# Patient Record
Sex: Male | Born: 1935 | Race: White | Hispanic: No | Marital: Married | Smoking: Former smoker
Health system: Southern US, Community
[De-identification: ages and names within clinical notes are randomized; demographics above are authoritative.]

## PROBLEM LIST (undated history)

## (undated) DIAGNOSIS — D696 Thrombocytopenia, unspecified: Secondary | ICD-10-CM

## (undated) DIAGNOSIS — I6990 Unspecified sequelae of unspecified cerebrovascular disease: Secondary | ICD-10-CM

## (undated) DIAGNOSIS — R600 Localized edema: Secondary | ICD-10-CM

## (undated) DIAGNOSIS — Z860100 Personal history of colon polyps, unspecified: Secondary | ICD-10-CM

## (undated) DIAGNOSIS — N400 Enlarged prostate without lower urinary tract symptoms: Secondary | ICD-10-CM

## (undated) DIAGNOSIS — N2889 Other specified disorders of kidney and ureter: Secondary | ICD-10-CM

## (undated) DIAGNOSIS — Z9989 Dependence on other enabling machines and devices: Secondary | ICD-10-CM

## (undated) DIAGNOSIS — Z9289 Personal history of other medical treatment: Secondary | ICD-10-CM

## (undated) DIAGNOSIS — R4189 Other symptoms and signs involving cognitive functions and awareness: Secondary | ICD-10-CM

## (undated) DIAGNOSIS — R627 Adult failure to thrive: Secondary | ICD-10-CM

## (undated) DIAGNOSIS — N183 Chronic kidney disease, stage 3 unspecified: Secondary | ICD-10-CM

## (undated) DIAGNOSIS — I517 Cardiomegaly: Secondary | ICD-10-CM

## (undated) DIAGNOSIS — K5730 Diverticulosis of large intestine without perforation or abscess without bleeding: Secondary | ICD-10-CM

## (undated) DIAGNOSIS — G629 Polyneuropathy, unspecified: Secondary | ICD-10-CM

## (undated) DIAGNOSIS — J309 Allergic rhinitis, unspecified: Secondary | ICD-10-CM

## (undated) DIAGNOSIS — M1990 Unspecified osteoarthritis, unspecified site: Secondary | ICD-10-CM

## (undated) DIAGNOSIS — E785 Hyperlipidemia, unspecified: Secondary | ICD-10-CM

## (undated) DIAGNOSIS — C649 Malignant neoplasm of unspecified kidney, except renal pelvis: Secondary | ICD-10-CM

## (undated) DIAGNOSIS — I5189 Other ill-defined heart diseases: Secondary | ICD-10-CM

## (undated) DIAGNOSIS — K5900 Constipation, unspecified: Secondary | ICD-10-CM

## (undated) DIAGNOSIS — L719 Rosacea, unspecified: Secondary | ICD-10-CM

## (undated) DIAGNOSIS — I10 Essential (primary) hypertension: Secondary | ICD-10-CM

## (undated) DIAGNOSIS — N390 Urinary tract infection, site not specified: Secondary | ICD-10-CM

## (undated) DIAGNOSIS — K5790 Diverticulosis of intestine, part unspecified, without perforation or abscess without bleeding: Secondary | ICD-10-CM

## (undated) DIAGNOSIS — N529 Male erectile dysfunction, unspecified: Secondary | ICD-10-CM

## (undated) DIAGNOSIS — I2721 Secondary pulmonary arterial hypertension: Secondary | ICD-10-CM

## (undated) DIAGNOSIS — C439 Malignant melanoma of skin, unspecified: Secondary | ICD-10-CM

## (undated) DIAGNOSIS — F0390 Unspecified dementia without behavioral disturbance: Secondary | ICD-10-CM

## (undated) DIAGNOSIS — I4891 Unspecified atrial fibrillation: Secondary | ICD-10-CM

## (undated) DIAGNOSIS — Z9229 Personal history of other drug therapy: Secondary | ICD-10-CM

## (undated) DIAGNOSIS — I739 Peripheral vascular disease, unspecified: Secondary | ICD-10-CM

## (undated) DIAGNOSIS — R197 Diarrhea, unspecified: Secondary | ICD-10-CM

## (undated) DIAGNOSIS — H269 Unspecified cataract: Secondary | ICD-10-CM

## (undated) HISTORY — DX: Malignant neoplasm of unspecified kidney, except renal pelvis: C64.9

## (undated) HISTORY — DX: Localized edema: R60.0

## (undated) HISTORY — DX: Other specified disorders of kidney and ureter: N28.89

## (undated) HISTORY — DX: Constipation, unspecified: K59.00

## (undated) HISTORY — PX: THORACIC LAMINECTOMY: SHX96

## (undated) HISTORY — PX: CATARACT EXTRACTION: SUR2

## (undated) HISTORY — DX: Other symptoms and signs involving cognitive functions and awareness: R41.89

## (undated) HISTORY — DX: Urinary tract infection, site not specified: N39.0

## (undated) HISTORY — DX: Secondary pulmonary arterial hypertension: I27.21

## (undated) HISTORY — PX: PILONIDAL CYST DRAINAGE: SHX743

## (undated) HISTORY — PX: TONSILLECTOMY AND ADENOIDECTOMY: SUR1326

## (undated) HISTORY — DX: Unspecified osteoarthritis, unspecified site: M19.90

## (undated) HISTORY — DX: Personal history of colonic polyps: Z86.010

## (undated) HISTORY — DX: Personal history of other drug therapy: Z92.29

## (undated) HISTORY — DX: Rosacea, unspecified: L71.9

## (undated) HISTORY — PX: SPINE SURGERY: SHX786

## (undated) HISTORY — PX: OTHER SURGICAL HISTORY: SHX169

## (undated) HISTORY — DX: Male erectile dysfunction, unspecified: N52.9

## (undated) HISTORY — DX: Polyneuropathy, unspecified: G62.9

## (undated) HISTORY — PX: POLYPECTOMY: SHX149

## (undated) HISTORY — DX: Benign prostatic hyperplasia without lower urinary tract symptoms: N40.0

## (undated) HISTORY — DX: Diverticulosis of intestine, part unspecified, without perforation or abscess without bleeding: K57.90

## (undated) HISTORY — DX: Personal history of colon polyps, unspecified: Z86.0100

## (undated) HISTORY — DX: Chronic kidney disease, stage 3 unspecified: N18.30

## (undated) HISTORY — DX: Unspecified cataract: H26.9

## (undated) HISTORY — DX: Allergic rhinitis, unspecified: J30.9

## (undated) HISTORY — DX: Peripheral vascular disease, unspecified: I73.9

## (undated) HISTORY — DX: Adult failure to thrive: R62.7

## (undated) HISTORY — DX: Diverticulosis of large intestine without perforation or abscess without bleeding: K57.30

## (undated) HISTORY — DX: Other ill-defined heart diseases: I51.89

## (undated) HISTORY — DX: Diarrhea, unspecified: R19.7

## (undated) HISTORY — DX: Dependence on other enabling machines and devices: Z99.89

## (undated) HISTORY — DX: Malignant melanoma of skin, unspecified: C43.9

## (undated) HISTORY — DX: Essential (primary) hypertension: I10

## (undated) HISTORY — PX: KNEE SURGERY: SHX244

## (undated) HISTORY — DX: Hyperlipidemia, unspecified: E78.5

## (undated) HISTORY — DX: Unspecified sequelae of unspecified cerebrovascular disease: I69.90

## (undated) HISTORY — PX: APPENDECTOMY: SHX54

---
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
## 1998-01-25 ENCOUNTER — Ambulatory Visit: Admission: RE | Admit: 1998-01-25 | Discharge: 1998-01-25 | Payer: Self-pay | Admitting: Internal Medicine

## 1998-04-30 ENCOUNTER — Other Ambulatory Visit: Admission: RE | Admit: 1998-04-30 | Discharge: 1998-04-30 | Payer: Self-pay | Admitting: Gastroenterology

## 2000-03-02 ENCOUNTER — Ambulatory Visit (HOSPITAL_COMMUNITY): Admission: RE | Admit: 2000-03-02 | Discharge: 2000-03-02 | Payer: Self-pay | Admitting: Internal Medicine

## 2000-03-17 ENCOUNTER — Ambulatory Visit (HOSPITAL_COMMUNITY): Admission: RE | Admit: 2000-03-17 | Discharge: 2000-03-17 | Payer: Self-pay | Admitting: Interventional Cardiology

## 2005-08-04 ENCOUNTER — Encounter (INDEPENDENT_AMBULATORY_CARE_PROVIDER_SITE_OTHER): Payer: Self-pay | Admitting: *Deleted

## 2005-08-04 ENCOUNTER — Ambulatory Visit (HOSPITAL_BASED_OUTPATIENT_CLINIC_OR_DEPARTMENT_OTHER): Admission: RE | Admit: 2005-08-04 | Discharge: 2005-08-04 | Payer: Self-pay | Admitting: General Surgery

## 2006-03-04 ENCOUNTER — Ambulatory Visit: Payer: Self-pay | Admitting: Gastroenterology

## 2006-03-16 ENCOUNTER — Ambulatory Visit: Payer: Self-pay | Admitting: Gastroenterology

## 2007-02-03 ENCOUNTER — Encounter: Admission: RE | Admit: 2007-02-03 | Discharge: 2007-02-03 | Payer: Self-pay | Admitting: Internal Medicine

## 2007-11-05 ENCOUNTER — Ambulatory Visit (HOSPITAL_BASED_OUTPATIENT_CLINIC_OR_DEPARTMENT_OTHER): Admission: RE | Admit: 2007-11-05 | Discharge: 2007-11-05 | Payer: Self-pay | Admitting: General Surgery

## 2007-11-05 ENCOUNTER — Encounter (INDEPENDENT_AMBULATORY_CARE_PROVIDER_SITE_OTHER): Payer: Self-pay | Admitting: General Surgery

## 2008-02-04 ENCOUNTER — Encounter (INDEPENDENT_AMBULATORY_CARE_PROVIDER_SITE_OTHER): Payer: Self-pay | Admitting: General Surgery

## 2008-02-04 ENCOUNTER — Ambulatory Visit (HOSPITAL_BASED_OUTPATIENT_CLINIC_OR_DEPARTMENT_OTHER): Admission: RE | Admit: 2008-02-04 | Discharge: 2008-02-04 | Payer: Self-pay | Admitting: General Surgery

## 2008-02-07 ENCOUNTER — Encounter: Admission: RE | Admit: 2008-02-07 | Discharge: 2008-02-07 | Payer: Self-pay | Admitting: Specialist

## 2008-03-07 ENCOUNTER — Ambulatory Visit: Payer: Self-pay | Admitting: Oncology

## 2008-08-03 ENCOUNTER — Encounter: Admission: RE | Admit: 2008-08-03 | Discharge: 2008-10-05 | Payer: Self-pay | Admitting: Neurosurgery

## 2009-05-14 ENCOUNTER — Encounter: Admission: RE | Admit: 2009-05-14 | Discharge: 2009-06-13 | Payer: Self-pay | Admitting: Neurosurgery

## 2010-11-19 NOTE — Op Note (Signed)
NAMECONCEPCION, Peter Singh              ACCOUNT NO.:  [id number]   MEDICAL RECORD NO.:  [id number]          PATIENT TYPE:  AMB   LOCATION:  DSC                          FACILITY:  [facility]   PHYSICIAN:  Gabrielle Dare. Janee Morn, M.D.DATE OF BIRTH:  [birth year]   DATE OF PROCEDURE:  02/04/2008  DATE OF DISCHARGE:                               OPERATIVE REPORT   PREOPERATIVE DIAGNOSES:  1. Status post wide excision of melanoma, left forearm.  2. Residual of atypical melanocytes.   POSTOPERATIVE DIAGNOSES:  1. Status post wide excision of melanoma, left forearm.  2. Residual of atypical melanocytes.   PROCEDURE:  Wide re-excision of melanoma, left forearm.   SURGEON:  Gabrielle Dare. Janee Morn, MD   ANESTHESIA:  General.   HISTORY OF PRESENT ILLNESS:  Peter Singh is a very pleasant 77 year old  gentleman who underwent wide excision of melanoma of the left forearm  with left axillary sentinel node biopsy in May 2009.  His proximal  margin was positive for some residual atypical melanocytes, so he  returns for planned wide re-excision of the melanoma site on his left  forearm.   PROCEDURE IN DETAIL:  Informed consent was obtained.  The patient was  identified in the preop holding area.  His site was marked.  He received  intravenous antibiotics.  He was brought to the operating room.  General  anesthesia with laryngeal mask airway was administered.  His left  forearm was prepped and draped in the sterile fashion.  Marcaine 0.5%  with epinephrine was injected for postoperative pain relief.  An  elliptical re-excision was then done to encompass the entirety of his  old scar, coming just distal to the old scar and then taking a 1-cm  width of the proximal margin.  This was taken down to the underlying  fascia and excised.  It was marked for pathology orientation and passed  off.  The wound was irrigated.  Meticulous hemostasis was ensured.  The  wound was then closed in layers with deep tissues  approximated with  interrupted 3-0 Vicryl sutures and the skin was closed with  interrupted 3-0 nylon sutures.  Sponge, needle, and instrument counts  were all correct.  A sterile dressing was applied.  Length of the  specimen was 9 cm and width 1 cm.  He tolerated the procedure well  without apparent complication and was taken to the recovery room in  stable condition.      Gabrielle Dare Janee Morn, M.D.  Electronically Signed     BET/MEDQ  D:  02/04/2008  T:  02/04/2008  Job:  [id number]   cc:   Lyn Records, M.D.  Dollene Cleveland, M.D.  Gwen Pounds, MD

## 2010-11-19 NOTE — Op Note (Signed)
NAMEJEMUEL, Singh              ACCOUNT NO.:  [id number]   MEDICAL RECORD NO.:  [id number]          PATIENT TYPE:  AMB   LOCATION:  DSC                          FACILITY:  [facility]   PHYSICIAN:  Gabrielle Dare. Janee Morn, M.D.DATE OF BIRTH:  April 20, 1932   DATE OF PROCEDURE:  11/05/2007  DATE OF DISCHARGE:                               OPERATIVE REPORT   PREOPERATIVE DIAGNOSIS:  Melanoma, left forearm.   POSTOPERATIVE DIAGNOSIS:  Melanoma, left forearm.   PROCEDURES:  1. Left axillary sentinel lymph node biopsy with blue dye injection.  2. Wide excision, melanoma, left forearm, with layered closure, 9 cm.   SURGEON:  Gabrielle Dare. Janee Morn, MD   HISTORY OF PRESENT ILLNESS:  Peter Singh is a very pleasant 77-year-  old male who is status post excision of an atypical melanocytic tumor  from his right shoulder in January 2007.  He recently developed a lesion  on his left forearm that was biopsied by Dr. Mayford Knife.  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  He  presents today for left axillary sentinel lymph node biopsy and wide  excision of this melanoma from his left forearm.  He has held his  Coumadin for 3 days.   PROCEDURE IN DETAIL:  Informed consent was obtained.  The patient was  identified in the preop holding area.  He received intravenous  antibiotics.  His site was marked.  He was brought to the operating  room.  General anesthesia was administered by the anesthesia staff with  a laryngeal mask airway.  The area around the melanoma was injected with  a 2:1 mixture of saline and methylene blue in a cutaneous injection.  This was then massaged for several minutes by the clock.  He had also  received his nuclear medicine injection earlier this morning.  The  patient's left chest, axilla and entire left upper extremity were  prepped and draped in a sterile fashion.  Attention was first directed  to the axilla for the sentinel node.  The NeoProbe was used  and an area  in the anterior axilla was noted with elevated counts.  Marcaine 0.5%  with epinephrine was injected.  A transverse incision was made.  Subcutaneous tissues were dissected down into the axillary fat.  A blue  node with high signal was identified.  This was circumferentially  dissected out.  Hemostasis was carefully obtained with Bovie the cautery  due to his Coumadin history.  The node was removed in one piece.  It had  a high signal on the NeoProbe and was sent to pathology as hot blue  node.  Further exploration with the NeoProbe did not reveal any other  significant signal source.  This node did have a blue tinge.  There were  no frank abnormalities seen.  The wound was copiously irrigated and  careful and meticulous hemostasis was obtained due to his Coumadin  history.  The wound was then closed in layers with deep tissues  approximated with running 3-0 Vicryl suture, the subcutaneous tissues  approximated with interrupted 3-0 Vicryl sutures and the skin closed  with running 4-0 Monocryl subcuticular stitch.  Dermabond was then  placed.  Excellent hemostasis had been obtained in the wound.  This  wound was then covered.  Attention was directed to the melanoma.  An  area of the least 1.5 cm circumferentially was measured around his  biopsy site and an elliptical incision was planned along tissue lines.  A 9 x 4.5-cm ellipse was excised after placing some more local  anesthetic.  Subcutaneous tissues were dissected down to the underlying  fascia and it was removed in its entirety.  It was oriented with 2-0  silk for pathology.  The wound bed was then irrigated and very careful  and meticulous hemostasis was obtained due to his Coumadin history.  After orienting, the specimen had been sent off to pathology.  Flaps  were raised proximally and distally to facilitate closure.  The wound  was then closed first with subcutaneous tissues approximated with  interrupted 2-0 Vicryl  sutures.  Hemostasis was ensured and the skin was  closed with interrupted  3-0 nylon sutures.  The forearm remained soft and there was no  significant tension on the closure.  Xeroform and a sterile dressing  were applied.  Sponge, needle and instrument counts were all correct.  The patient tolerated the procedure well without apparent complication  and was taken to the recovery room in stable condition.      Gabrielle Dare Janee Morn, M.D.  Electronically Signed     BET/MEDQ  D:  11/05/2007  T:  11/05/2007  Job:  [id number]   cc:   Dollene Cleveland, M.D.  Lyn Records, M.D.

## 2010-11-22 NOTE — Cardiovascular Report (Signed)
[facility]  Patient:    Peter Singh, Peter Singh                     MRN: [id number] Proc. Date: 03/17/00 Adm. Date:  [id number] Disc. Date: [id number] Attending:  Lyn Records. Iii CC:         Jonelle Sports. Cheryll Cockayne, M.D.   Cardiac Catheterization  INDICATIONS FOR PROCEDURE:  Borderline abnormal exercise treadmill test.  No symptoms.  PROCEDURES PERFORMED: 1. Left heart catheterization. 2. Selective coronary angiography. 3. Left ventriculography. 4. Perclose.  DESCRIPTION OF PROCEDURE:  After informed consent, a 6 French sheath was started in the right femoral artery using a modified Seldinger technique.  A 6 French A2 multipurpose catheter was used for hemodynamic recordings, left ventriculography, and selective right coronary angiography.  A 6 French #4 Judkins catheter was used for left coronary angiography.  After completing the procedure a sheathogram was performed in the right iliac. Perclose was performed without complications.  RESULTS:  I:  HEMODYNAMIC DATA:     a. The aortic pressure 129/67 mmHg.     b. Left ventricular pressure 129/13 mmHg.  II:  LEFT VENTRICULOGRAPHY:  The left ventricle cavity size and systolic function are normal.  No mitral regurgitation is noted.  Ejection fraction is greater than 60%.  III:  SELECTIVE CORONARY ANGIOGRAPHY:     a. Left main:  The left main coronary artery is free of any significant        obstruction and basically appears normal.     b. Left anterior descending coronary artery:  This is a large vessel that        gives origin to a large first diagonal and a smaller second distal        diagonal.  The entire left coronary system is normal.  The LAD wraps        around the left ventricular apex.     c. Circumflex artery:  The circumflex artery is relatively small giving        origin to one large obtuse marginal branch.  The circumflex system        is normal.     d. Right coronary artery:  The  right coronary artery is a large vessel        that gives origin to PDA and two large left ventricular branches.        This system is normal.  CONCLUSIONS: 1. Normal coronary arteries. 2. Left ventricular function. 3. Borderline abnormal exercise treadmill test, does not correlate with any    evidence of significant obstructive coronary disease.  RECOMMENDATIONS:  No further cardiac evaluation at this time. DD:  03/17/00 TD:  03/17/00 Job: [id number]

## 2011-02-04 ENCOUNTER — Other Ambulatory Visit: Payer: Self-pay | Admitting: Dermatology

## 2011-02-27 ENCOUNTER — Encounter: Payer: Self-pay | Admitting: Gastroenterology

## 2011-04-03 ENCOUNTER — Encounter: Payer: Self-pay | Admitting: *Deleted

## 2011-04-04 LAB — BASIC METABOLIC PANEL
BUN: 16
CO2: 31
Chloride: 101
Potassium: 4.4

## 2011-04-04 LAB — PROTIME-INR
INR: 1.3
Prothrombin Time: 16.1 — ABNORMAL HIGH

## 2011-04-07 ENCOUNTER — Other Ambulatory Visit: Payer: Self-pay | Admitting: Dermatology

## 2011-04-08 ENCOUNTER — Ambulatory Visit (INDEPENDENT_AMBULATORY_CARE_PROVIDER_SITE_OTHER): Payer: Medicare Other | Admitting: Gastroenterology

## 2011-04-08 ENCOUNTER — Encounter: Payer: Self-pay | Admitting: Gastroenterology

## 2011-04-08 VITALS — BP 112/56 | HR 64 | Ht 79.0 in | Wt 268.4 lb

## 2011-04-08 DIAGNOSIS — Z860100 Personal history of colon polyps, unspecified: Secondary | ICD-10-CM

## 2011-04-08 NOTE — Progress Notes (Signed)
History of Present Illness: This is a 77 year old Caucasian male with long history of cardiovascular problems requiring room and then anticoagulation. Also has rather severe BPH and is on Flomax and Avodart. He has a past history of colon polyps with a negative colonoscopy 5 years ago. He currently is asymptomatic in terms of any gastrointestinal symptoms. Family history is noncontributory. Allegedly he recently did Hemoccult cards for Dr. Creola Corn. He specifically denies abdominal pain, melena, hematochezia, upper GI or hepatobiliary complaints. His appetite is good and his weight is stable. He currently is on antibiotic for URI.    Current Medications, Allergies, Past Medical History, Past Surgical History, Family History and Social History were reviewed in [employer] record.   Assessment and plan: See no need for followup colonoscopy at his age with a negative exam 5 years ago. Of course, this is predicated on the assumption that his stool Hemoccult cards are negative. Request these from Dr.Russo. If they are positive, colonoscopy with adjustment in his Coumadin medication would be indicated.  Please copy her primary care physician, referring physician, and pertinent subspecialists. Encounter Diagnosis  Name Primary?  . Personal history of colonic polyps Yes

## 2011-07-22 DIAGNOSIS — Z7901 Long term (current) use of anticoagulants: Secondary | ICD-10-CM | POA: Diagnosis not present

## 2011-07-22 DIAGNOSIS — I4891 Unspecified atrial fibrillation: Secondary | ICD-10-CM | POA: Diagnosis not present

## 2011-08-06 DIAGNOSIS — R82998 Other abnormal findings in urine: Secondary | ICD-10-CM | POA: Diagnosis not present

## 2011-08-28 DIAGNOSIS — R82998 Other abnormal findings in urine: Secondary | ICD-10-CM | POA: Diagnosis not present

## 2011-08-28 DIAGNOSIS — N401 Enlarged prostate with lower urinary tract symptoms: Secondary | ICD-10-CM | POA: Diagnosis not present

## 2011-08-28 DIAGNOSIS — R339 Retention of urine, unspecified: Secondary | ICD-10-CM | POA: Diagnosis not present

## 2011-08-28 DIAGNOSIS — N529 Male erectile dysfunction, unspecified: Secondary | ICD-10-CM | POA: Diagnosis not present

## 2011-09-02 DIAGNOSIS — N401 Enlarged prostate with lower urinary tract symptoms: Secondary | ICD-10-CM | POA: Diagnosis not present

## 2011-09-02 DIAGNOSIS — R269 Unspecified abnormalities of gait and mobility: Secondary | ICD-10-CM | POA: Diagnosis not present

## 2011-09-02 DIAGNOSIS — R7309 Other abnormal glucose: Secondary | ICD-10-CM | POA: Diagnosis not present

## 2011-09-02 DIAGNOSIS — E785 Hyperlipidemia, unspecified: Secondary | ICD-10-CM | POA: Diagnosis not present

## 2011-09-02 DIAGNOSIS — G609 Hereditary and idiopathic neuropathy, unspecified: Secondary | ICD-10-CM | POA: Diagnosis not present

## 2011-09-02 DIAGNOSIS — I517 Cardiomegaly: Secondary | ICD-10-CM | POA: Diagnosis not present

## 2011-09-02 DIAGNOSIS — I4891 Unspecified atrial fibrillation: Secondary | ICD-10-CM | POA: Diagnosis not present

## 2011-09-02 DIAGNOSIS — Z7901 Long term (current) use of anticoagulants: Secondary | ICD-10-CM | POA: Diagnosis not present

## 2011-10-14 ENCOUNTER — Other Ambulatory Visit: Payer: Self-pay | Admitting: Dermatology

## 2011-10-14 DIAGNOSIS — Z7901 Long term (current) use of anticoagulants: Secondary | ICD-10-CM | POA: Diagnosis not present

## 2011-10-14 DIAGNOSIS — Z85828 Personal history of other malignant neoplasm of skin: Secondary | ICD-10-CM | POA: Diagnosis not present

## 2011-10-14 DIAGNOSIS — L821 Other seborrheic keratosis: Secondary | ICD-10-CM | POA: Diagnosis not present

## 2011-10-14 DIAGNOSIS — L570 Actinic keratosis: Secondary | ICD-10-CM | POA: Diagnosis not present

## 2011-10-14 DIAGNOSIS — C4442 Squamous cell carcinoma of skin of scalp and neck: Secondary | ICD-10-CM | POA: Diagnosis not present

## 2011-10-14 DIAGNOSIS — I4891 Unspecified atrial fibrillation: Secondary | ICD-10-CM | POA: Diagnosis not present

## 2011-11-25 DIAGNOSIS — I4891 Unspecified atrial fibrillation: Secondary | ICD-10-CM | POA: Diagnosis not present

## 2011-11-25 DIAGNOSIS — Z7901 Long term (current) use of anticoagulants: Secondary | ICD-10-CM | POA: Diagnosis not present

## 2012-01-06 DIAGNOSIS — I4891 Unspecified atrial fibrillation: Secondary | ICD-10-CM | POA: Diagnosis not present

## 2012-01-06 DIAGNOSIS — Z7901 Long term (current) use of anticoagulants: Secondary | ICD-10-CM | POA: Diagnosis not present

## 2012-01-06 DIAGNOSIS — L608 Other nail disorders: Secondary | ICD-10-CM | POA: Diagnosis not present

## 2012-01-07 ENCOUNTER — Encounter (HOSPITAL_COMMUNITY): Payer: Self-pay | Admitting: *Deleted

## 2012-01-07 ENCOUNTER — Emergency Department (HOSPITAL_COMMUNITY)
Admission: EM | Admit: 2012-01-07 | Discharge: 2012-01-07 | Disposition: A | Discharge status: Home / Self Care | Payer: Medicare Other | Attending: Emergency Medicine | Admitting: Emergency Medicine

## 2012-01-07 ENCOUNTER — Emergency Department (HOSPITAL_COMMUNITY): Payer: Medicare Other

## 2012-01-07 DIAGNOSIS — Z87891 Personal history of nicotine dependence: Secondary | ICD-10-CM | POA: Insufficient documentation

## 2012-01-07 DIAGNOSIS — S0180XA Unspecified open wound of other part of head, initial encounter: Secondary | ICD-10-CM | POA: Insufficient documentation

## 2012-01-07 DIAGNOSIS — I4891 Unspecified atrial fibrillation: Secondary | ICD-10-CM | POA: Insufficient documentation

## 2012-01-07 DIAGNOSIS — Z860100 Personal history of colon polyps, unspecified: Secondary | ICD-10-CM | POA: Insufficient documentation

## 2012-01-07 DIAGNOSIS — S0990XA Unspecified injury of head, initial encounter: Secondary | ICD-10-CM | POA: Diagnosis not present

## 2012-01-07 DIAGNOSIS — S0100XA Unspecified open wound of scalp, initial encounter: Secondary | ICD-10-CM | POA: Diagnosis not present

## 2012-01-07 DIAGNOSIS — Z85820 Personal history of malignant melanoma of skin: Secondary | ICD-10-CM | POA: Insufficient documentation

## 2012-01-07 DIAGNOSIS — Z823 Family history of stroke: Secondary | ICD-10-CM | POA: Diagnosis not present

## 2012-01-07 DIAGNOSIS — T1490XA Injury, unspecified, initial encounter: Secondary | ICD-10-CM | POA: Diagnosis not present

## 2012-01-07 DIAGNOSIS — E785 Hyperlipidemia, unspecified: Secondary | ICD-10-CM | POA: Diagnosis not present

## 2012-01-07 DIAGNOSIS — S0083XA Contusion of other part of head, initial encounter: Secondary | ICD-10-CM | POA: Diagnosis not present

## 2012-01-07 DIAGNOSIS — S0181XA Laceration without foreign body of other part of head, initial encounter: Secondary | ICD-10-CM

## 2012-01-07 DIAGNOSIS — W010XXA Fall on same level from slipping, tripping and stumbling without subsequent striking against object, initial encounter: Secondary | ICD-10-CM | POA: Insufficient documentation

## 2012-01-07 DIAGNOSIS — S0003XA Contusion of scalp, initial encounter: Secondary | ICD-10-CM | POA: Diagnosis not present

## 2012-01-07 HISTORY — DX: Unspecified atrial fibrillation: I48.91

## 2012-01-07 HISTORY — DX: Cardiomegaly: I51.7

## 2012-01-07 LAB — POCT I-STAT, CHEM 8
Calcium, Ion: 1.29 mmol/L (ref 1.12–1.32)
Chloride: 103 mEq/L (ref 96–112)
HCT: 47 % (ref 39.0–52.0)
TCO2: 22 mmol/L (ref 0–100)

## 2012-01-07 LAB — PROTIME-INR
INR: 1.86 — ABNORMAL HIGH (ref 0.00–1.49)
Prothrombin Time: 21.8 seconds — ABNORMAL HIGH (ref 11.6–15.2)

## 2012-01-07 MED ORDER — TETANUS-DIPHTH-ACELL PERTUSSIS 5-2.5-18.5 LF-MCG/0.5 IM SUSP
0.5000 mL | Freq: Once | INTRAMUSCULAR | Status: AC
  Administered 2012-01-07: 0.5 mL via INTRAMUSCULAR
  Filled 2012-01-07: qty 0.5

## 2012-01-07 MED ORDER — BACITRACIN ZINC 500 UNIT/GM EX OINT
1.0000 "application " | TOPICAL_OINTMENT | Freq: Once | CUTANEOUS | Status: AC
  Administered 2012-01-07: 1 via TOPICAL
  Filled 2012-01-07: qty 0.9

## 2012-01-07 NOTE — ED Notes (Signed)
ION:GE95<MW> Expected date:01/07/12<BR> Expected time: 6:51 AM<BR> Means of arrival:Ambulance<BR> Comments:<BR> Fall/Head laceration

## 2012-01-07 NOTE — ED Provider Notes (Signed)
LACERATION REPAIR Performed by: Dorthula Matas Authorized by: Dorthula Matas Consent: Verbal consent obtained. Risks and benefits: risks, benefits and alternatives were discussed Consent given by: patient Patient identity confirmed: provided demographic data Prepped and Draped in normal sterile fashion Wound explored  Laceration Location: right forehead  Laceration Length: 6 cm  No Foreign Bodies seen or palpated  Anesthesia: local infiltration  Local anesthetic: lidocaine 2 % with epinephrine  Anesthetic total: 5 ml  Irrigation method: syringe Amount of cleaning: standard  Skin closure: sutures  Number of sutures: 13  Technique: simple interrupted   Patient tolerance: Patient tolerated the procedure well with no immediate complications.   Dorthula Matas, PA 01/07/12 [phone]

## 2012-01-07 NOTE — ED Provider Notes (Signed)
History     CSN: [id number]  Arrival date & time 01/07/12  0709   First MD Initiated Contact with Patient 01/07/12 [phone]      Chief Complaint  Patient presents with  . Fall  . Head Laceration     HPI Pt was walking out of the bathroom this am and slipped on the wet floor.  He struck his forehead on a piece of furniture and sustained a laceration.  EMS arrived and brought him to the ED.  A bandage was applied and the bleeding has not stopped. He denies LOC.  No neck pain.  No pain in his extremities.  No nausea or vomiting.  Pt does take coumadin. Past Medical History  Diagnosis Date  . Diverticulosis of colon (without mention of hemorrhage)   . Personal history of colonic polyps 1999 & 2004    adenomatous polyps  . Hyperlipemia   . Melanoma   . Atrial fibrillation   . Ventricular hypertrophy     Past Surgical History  Procedure Date  . Tonsillectomy and adenoidectomy   . Knee surgery     right  . Pilonidal cyst drainage   . Spine surgery     tumor removed    Family History  Problem Relation Age of Onset  . Stroke Father     History  Substance Use Topics  . Smoking status: Former Smoker    Quit date: 06/09/1975  . Smokeless tobacco: Never Used  . Alcohol Use: Yes     2 drinks per day      Review of Systems  All other systems reviewed and are negative.    Allergies  Penicillins  Home Medications   Current Outpatient Rx  Name Route Sig Dispense Refill  . ATORVASTATIN CALCIUM 20 MG PO TABS Oral Take 20 mg by mouth daily.      . DUTASTERIDE 0.5 MG PO CAPS Oral Take 0.5 mg by mouth daily.      Marland Kitchen HYDROCHLOROTHIAZIDE 25 MG PO TABS Oral Take 25 mg by mouth daily.      Marland Kitchen METOPROLOL SUCCINATE ER 50 MG PO TB24 Oral Take 50 mg by mouth daily.      . WARFARIN SODIUM 5 MG PO TABS Oral Take 5 mg by mouth. Take 7.5mg      take 5.0mg  by mouth 3 days    . TAMSULOSIN HCL 0.4 MG PO CAPS Oral Take 0.4 mg by mouth 2 (two) times daily.        BP 143/53  Pulse 66  Temp  99 F (37.2 C) (Oral)  Resp 20  SpO2 93%  Physical Exam  Nursing note and vitals reviewed. Constitutional: He appears well-developed and well-nourished. No distress.  HENT:  Head: Normocephalic. Head is with laceration.    Right Ear: External ear normal.  Left Ear: External ear normal.  Eyes: Conjunctivae are normal. Pupils are equal, round, and reactive to light. Right eye exhibits no discharge. Left eye exhibits no discharge. No scleral icterus.  Neck: Neck supple. No tracheal deviation present.  Cardiovascular: Normal rate, regular rhythm and intact distal pulses.   Pulmonary/Chest: Effort normal and breath sounds normal. No stridor. No respiratory distress. He has no wheezes. He has no rales.  Abdominal: Soft. Bowel sounds are normal. He exhibits no distension. There is no tenderness. There is no rebound and no guarding.  Musculoskeletal: He exhibits no edema and no tenderness.       Cervical back: Normal.       Thoracic  back: Normal.       Lumbar back: Normal.  Neurological: He is alert. He has normal strength. No sensory deficit. Cranial nerve deficit:  no gross defecits noted. He exhibits normal muscle tone. He displays no seizure activity. Coordination normal.  Skin: Skin is warm and dry. No rash noted.  Psychiatric: He has a normal mood and affect.    ED Course  Procedures (including critical care time)  Labs Reviewed  PROTIME-INR - Abnormal; Notable for the following:    Prothrombin Time 21.8 (*)     INR 1.86 (*)     All other components within normal limits  POCT I-STAT, CHEM 8 - Abnormal; Notable for the following:    Glucose, Bld 154 (*)     All other components within normal limits   Ct Head Wo Contrast  01/07/2012  *RADIOLOGY REPORT*  Clinical Data: History of fall with head laceration.  CT HEAD WITHOUT CONTRAST  Technique:  Contiguous axial images were obtained from the base of the skull through the vertex without contrast.  Comparison: No priors.  Findings:  There is a soft tissue defect with surrounding swelling in the right frontal scalp, compatible with the reported laceration.  No underlying displaced skull fractures are identified.  No definite acute intracranial abnormality. Specifically, no definite evidence of acute post-traumatic intracranial hemorrhage, no signs of acute/subacute cerebral ischemia, no focal mass, mass effect, hydrocephalus or abnormal intra or extra-axial fluid collections.  There is mild cerebral and cerebellar atrophy with patchy and confluent areas of decreased attenuation throughout the deep and periventricular white matter of the cerebral hemispheres bilaterally, compatible with chronic microvascular ischemic changes.  Visualized paranasal sinuses and mastoids are well pneumatized.  IMPRESSION: 1.  Right frontal scalp laceration without evidence of underlying displaced skull fracture or acute intracranial abnormality. 2.  Mild cerebral and cerebellar atrophy with chronic microvascular ischemic changes in the cerebral white matter, as above.  Original Report Authenticated By: Florencia Reasons, M.D.     1. Laceration of forehead       MDM  Laceration was repaired under my supervision by PA Neva Seat.  Pt tolerated procedure well.  CT scan without signs of serious injury.  Suture removal in 5 days.  Precautions discussed with family.        Celene Kras, MD 01/07/12 [phone]

## 2012-01-07 NOTE — ED Notes (Signed)
Per ems pt slipped and fell in bathroom hitting right side of forehead on chair arm; presents with laceration over right eye extending up scalp; abrasions present; per ems pt denies neck pain/headache/back pain; per ems bleeding controlled; large dressing present; pt takes coumadin; pt alert and oriented on arrival; neg loss of consciousness

## 2012-01-07 NOTE — ED Notes (Signed)
PA at bedside to suture laceration

## 2012-01-12 DIAGNOSIS — R5383 Other fatigue: Secondary | ICD-10-CM | POA: Diagnosis not present

## 2012-01-12 DIAGNOSIS — S0180XA Unspecified open wound of other part of head, initial encounter: Secondary | ICD-10-CM | POA: Diagnosis not present

## 2012-01-12 DIAGNOSIS — R05 Cough: Secondary | ICD-10-CM | POA: Diagnosis not present

## 2012-01-12 DIAGNOSIS — Z7901 Long term (current) use of anticoagulants: Secondary | ICD-10-CM | POA: Diagnosis not present

## 2012-01-13 DIAGNOSIS — I4891 Unspecified atrial fibrillation: Secondary | ICD-10-CM | POA: Diagnosis not present

## 2012-01-13 DIAGNOSIS — Z7901 Long term (current) use of anticoagulants: Secondary | ICD-10-CM | POA: Diagnosis not present

## 2012-01-14 DIAGNOSIS — Z7901 Long term (current) use of anticoagulants: Secondary | ICD-10-CM | POA: Diagnosis not present

## 2012-01-14 DIAGNOSIS — S0180XA Unspecified open wound of other part of head, initial encounter: Secondary | ICD-10-CM | POA: Diagnosis not present

## 2012-01-14 DIAGNOSIS — R05 Cough: Secondary | ICD-10-CM | POA: Diagnosis not present

## 2012-01-14 DIAGNOSIS — Z4802 Encounter for removal of sutures: Secondary | ICD-10-CM | POA: Diagnosis not present

## 2012-01-19 ENCOUNTER — Other Ambulatory Visit: Payer: Self-pay | Admitting: Dermatology

## 2012-01-19 DIAGNOSIS — D239 Other benign neoplasm of skin, unspecified: Secondary | ICD-10-CM | POA: Diagnosis not present

## 2012-01-19 DIAGNOSIS — L821 Other seborrheic keratosis: Secondary | ICD-10-CM | POA: Diagnosis not present

## 2012-01-19 DIAGNOSIS — C44319 Basal cell carcinoma of skin of other parts of face: Secondary | ICD-10-CM | POA: Diagnosis not present

## 2012-01-19 DIAGNOSIS — L570 Actinic keratosis: Secondary | ICD-10-CM | POA: Diagnosis not present

## 2012-01-19 DIAGNOSIS — Z85828 Personal history of other malignant neoplasm of skin: Secondary | ICD-10-CM | POA: Diagnosis not present

## 2012-01-19 DIAGNOSIS — C44711 Basal cell carcinoma of skin of unspecified lower limb, including hip: Secondary | ICD-10-CM | POA: Diagnosis not present

## 2012-01-20 DIAGNOSIS — I4891 Unspecified atrial fibrillation: Secondary | ICD-10-CM | POA: Diagnosis not present

## 2012-01-20 DIAGNOSIS — Z7901 Long term (current) use of anticoagulants: Secondary | ICD-10-CM | POA: Diagnosis not present

## 2012-01-21 DIAGNOSIS — H524 Presbyopia: Secondary | ICD-10-CM | POA: Diagnosis not present

## 2012-01-21 DIAGNOSIS — Z961 Presence of intraocular lens: Secondary | ICD-10-CM | POA: Diagnosis not present

## 2012-01-21 DIAGNOSIS — H52209 Unspecified astigmatism, unspecified eye: Secondary | ICD-10-CM | POA: Diagnosis not present

## 2012-01-28 DIAGNOSIS — R339 Retention of urine, unspecified: Secondary | ICD-10-CM | POA: Diagnosis not present

## 2012-01-28 DIAGNOSIS — R5381 Other malaise: Secondary | ICD-10-CM | POA: Diagnosis not present

## 2012-01-28 DIAGNOSIS — R269 Unspecified abnormalities of gait and mobility: Secondary | ICD-10-CM | POA: Diagnosis not present

## 2012-01-28 DIAGNOSIS — R700 Elevated erythrocyte sedimentation rate: Secondary | ICD-10-CM | POA: Diagnosis not present

## 2012-01-28 DIAGNOSIS — R5383 Other fatigue: Secondary | ICD-10-CM | POA: Diagnosis not present

## 2012-01-28 DIAGNOSIS — R82998 Other abnormal findings in urine: Secondary | ICD-10-CM | POA: Diagnosis not present

## 2012-01-28 DIAGNOSIS — I4891 Unspecified atrial fibrillation: Secondary | ICD-10-CM | POA: Diagnosis not present

## 2012-02-02 DIAGNOSIS — Z7901 Long term (current) use of anticoagulants: Secondary | ICD-10-CM | POA: Diagnosis not present

## 2012-02-02 DIAGNOSIS — I4891 Unspecified atrial fibrillation: Secondary | ICD-10-CM | POA: Diagnosis not present

## 2012-02-03 DIAGNOSIS — M545 Low back pain, unspecified: Secondary | ICD-10-CM | POA: Diagnosis not present

## 2012-02-03 DIAGNOSIS — M5137 Other intervertebral disc degeneration, lumbosacral region: Secondary | ICD-10-CM | POA: Diagnosis not present

## 2012-02-13 DIAGNOSIS — R339 Retention of urine, unspecified: Secondary | ICD-10-CM | POA: Diagnosis not present

## 2012-02-13 DIAGNOSIS — N529 Male erectile dysfunction, unspecified: Secondary | ICD-10-CM | POA: Diagnosis not present

## 2012-02-13 DIAGNOSIS — N401 Enlarged prostate with lower urinary tract symptoms: Secondary | ICD-10-CM | POA: Diagnosis not present

## 2012-02-13 DIAGNOSIS — R82998 Other abnormal findings in urine: Secondary | ICD-10-CM | POA: Diagnosis not present

## 2012-03-03 DIAGNOSIS — M6281 Muscle weakness (generalized): Secondary | ICD-10-CM | POA: Diagnosis not present

## 2012-03-03 DIAGNOSIS — Z9181 History of falling: Secondary | ICD-10-CM | POA: Diagnosis not present

## 2012-03-03 DIAGNOSIS — R269 Unspecified abnormalities of gait and mobility: Secondary | ICD-10-CM | POA: Diagnosis not present

## 2012-03-03 DIAGNOSIS — I10 Essential (primary) hypertension: Secondary | ICD-10-CM | POA: Diagnosis not present

## 2012-03-03 DIAGNOSIS — Z7901 Long term (current) use of anticoagulants: Secondary | ICD-10-CM | POA: Diagnosis not present

## 2012-03-03 DIAGNOSIS — R279 Unspecified lack of coordination: Secondary | ICD-10-CM | POA: Diagnosis not present

## 2012-03-03 DIAGNOSIS — I4891 Unspecified atrial fibrillation: Secondary | ICD-10-CM | POA: Diagnosis not present

## 2012-03-09 DIAGNOSIS — Z9181 History of falling: Secondary | ICD-10-CM | POA: Diagnosis not present

## 2012-03-09 DIAGNOSIS — R269 Unspecified abnormalities of gait and mobility: Secondary | ICD-10-CM | POA: Diagnosis not present

## 2012-03-09 DIAGNOSIS — M6281 Muscle weakness (generalized): Secondary | ICD-10-CM | POA: Diagnosis not present

## 2012-03-09 DIAGNOSIS — R279 Unspecified lack of coordination: Secondary | ICD-10-CM | POA: Diagnosis not present

## 2012-03-12 DIAGNOSIS — R269 Unspecified abnormalities of gait and mobility: Secondary | ICD-10-CM | POA: Diagnosis not present

## 2012-03-12 DIAGNOSIS — R279 Unspecified lack of coordination: Secondary | ICD-10-CM | POA: Diagnosis not present

## 2012-03-12 DIAGNOSIS — M6281 Muscle weakness (generalized): Secondary | ICD-10-CM | POA: Diagnosis not present

## 2012-03-12 DIAGNOSIS — Z9181 History of falling: Secondary | ICD-10-CM | POA: Diagnosis not present

## 2012-03-15 DIAGNOSIS — M6281 Muscle weakness (generalized): Secondary | ICD-10-CM | POA: Diagnosis not present

## 2012-03-15 DIAGNOSIS — R269 Unspecified abnormalities of gait and mobility: Secondary | ICD-10-CM | POA: Diagnosis not present

## 2012-03-15 DIAGNOSIS — Z9181 History of falling: Secondary | ICD-10-CM | POA: Diagnosis not present

## 2012-03-15 DIAGNOSIS — R279 Unspecified lack of coordination: Secondary | ICD-10-CM | POA: Diagnosis not present

## 2012-03-16 DIAGNOSIS — Z23 Encounter for immunization: Secondary | ICD-10-CM | POA: Diagnosis not present

## 2012-03-19 DIAGNOSIS — Z9181 History of falling: Secondary | ICD-10-CM | POA: Diagnosis not present

## 2012-03-19 DIAGNOSIS — R279 Unspecified lack of coordination: Secondary | ICD-10-CM | POA: Diagnosis not present

## 2012-03-19 DIAGNOSIS — R269 Unspecified abnormalities of gait and mobility: Secondary | ICD-10-CM | POA: Diagnosis not present

## 2012-03-19 DIAGNOSIS — M6281 Muscle weakness (generalized): Secondary | ICD-10-CM | POA: Diagnosis not present

## 2012-03-23 DIAGNOSIS — R279 Unspecified lack of coordination: Secondary | ICD-10-CM | POA: Diagnosis not present

## 2012-03-23 DIAGNOSIS — Z9181 History of falling: Secondary | ICD-10-CM | POA: Diagnosis not present

## 2012-03-23 DIAGNOSIS — M6281 Muscle weakness (generalized): Secondary | ICD-10-CM | POA: Diagnosis not present

## 2012-03-23 DIAGNOSIS — R269 Unspecified abnormalities of gait and mobility: Secondary | ICD-10-CM | POA: Diagnosis not present

## 2012-03-26 DIAGNOSIS — R279 Unspecified lack of coordination: Secondary | ICD-10-CM | POA: Diagnosis not present

## 2012-03-26 DIAGNOSIS — M6281 Muscle weakness (generalized): Secondary | ICD-10-CM | POA: Diagnosis not present

## 2012-03-26 DIAGNOSIS — R269 Unspecified abnormalities of gait and mobility: Secondary | ICD-10-CM | POA: Diagnosis not present

## 2012-03-26 DIAGNOSIS — Z9181 History of falling: Secondary | ICD-10-CM | POA: Diagnosis not present

## 2012-03-29 ENCOUNTER — Encounter: Payer: Self-pay | Admitting: Gastroenterology

## 2012-03-30 DIAGNOSIS — M6281 Muscle weakness (generalized): Secondary | ICD-10-CM | POA: Diagnosis not present

## 2012-03-30 DIAGNOSIS — R279 Unspecified lack of coordination: Secondary | ICD-10-CM | POA: Diagnosis not present

## 2012-03-30 DIAGNOSIS — R269 Unspecified abnormalities of gait and mobility: Secondary | ICD-10-CM | POA: Diagnosis not present

## 2012-03-30 DIAGNOSIS — Z9181 History of falling: Secondary | ICD-10-CM | POA: Diagnosis not present

## 2012-03-31 DIAGNOSIS — Z7901 Long term (current) use of anticoagulants: Secondary | ICD-10-CM | POA: Diagnosis not present

## 2012-03-31 DIAGNOSIS — I4891 Unspecified atrial fibrillation: Secondary | ICD-10-CM | POA: Diagnosis not present

## 2012-04-01 DIAGNOSIS — E785 Hyperlipidemia, unspecified: Secondary | ICD-10-CM | POA: Diagnosis not present

## 2012-04-02 ENCOUNTER — Telehealth: Payer: Self-pay | Admitting: Gastroenterology

## 2012-04-02 NOTE — Telephone Encounter (Signed)
Informed pt's wife per  04/08/1011 OV note Dr Jarold Motto wrote because of pt's age and normal COLON in 2007, he did not need further COLONs. However, he must do stool cards with Dr Timothy Lasso. Wife stated they are doing stool cards.

## 2012-04-06 DIAGNOSIS — L84 Corns and callosities: Secondary | ICD-10-CM | POA: Diagnosis not present

## 2012-04-06 DIAGNOSIS — L608 Other nail disorders: Secondary | ICD-10-CM | POA: Diagnosis not present

## 2012-04-08 DIAGNOSIS — Z0000 Encounter for general adult medical examination without abnormal findings: Secondary | ICD-10-CM | POA: Diagnosis not present

## 2012-04-08 DIAGNOSIS — Q8503 Schwannomatosis: Secondary | ICD-10-CM | POA: Diagnosis not present

## 2012-04-08 DIAGNOSIS — I4891 Unspecified atrial fibrillation: Secondary | ICD-10-CM | POA: Diagnosis not present

## 2012-04-08 DIAGNOSIS — R7309 Other abnormal glucose: Secondary | ICD-10-CM | POA: Diagnosis not present

## 2012-05-10 DIAGNOSIS — Z7901 Long term (current) use of anticoagulants: Secondary | ICD-10-CM | POA: Diagnosis not present

## 2012-05-10 DIAGNOSIS — I4891 Unspecified atrial fibrillation: Secondary | ICD-10-CM | POA: Diagnosis not present

## 2012-05-18 DIAGNOSIS — L570 Actinic keratosis: Secondary | ICD-10-CM | POA: Diagnosis not present

## 2012-05-18 DIAGNOSIS — L821 Other seborrheic keratosis: Secondary | ICD-10-CM | POA: Diagnosis not present

## 2012-05-18 DIAGNOSIS — Z85828 Personal history of other malignant neoplasm of skin: Secondary | ICD-10-CM | POA: Diagnosis not present

## 2012-05-18 DIAGNOSIS — L408 Other psoriasis: Secondary | ICD-10-CM | POA: Diagnosis not present

## 2012-05-18 DIAGNOSIS — L819 Disorder of pigmentation, unspecified: Secondary | ICD-10-CM | POA: Diagnosis not present

## 2012-05-18 DIAGNOSIS — Z85820 Personal history of malignant melanoma of skin: Secondary | ICD-10-CM | POA: Diagnosis not present

## 2012-05-31 DIAGNOSIS — I4891 Unspecified atrial fibrillation: Secondary | ICD-10-CM | POA: Diagnosis not present

## 2012-05-31 DIAGNOSIS — Z7901 Long term (current) use of anticoagulants: Secondary | ICD-10-CM | POA: Diagnosis not present

## 2012-07-19 DIAGNOSIS — Z7901 Long term (current) use of anticoagulants: Secondary | ICD-10-CM | POA: Diagnosis not present

## 2012-07-19 DIAGNOSIS — I4891 Unspecified atrial fibrillation: Secondary | ICD-10-CM | POA: Diagnosis not present

## 2012-08-18 DIAGNOSIS — N401 Enlarged prostate with lower urinary tract symptoms: Secondary | ICD-10-CM | POA: Diagnosis not present

## 2012-08-18 DIAGNOSIS — N529 Male erectile dysfunction, unspecified: Secondary | ICD-10-CM | POA: Diagnosis not present

## 2012-08-18 DIAGNOSIS — N281 Cyst of kidney, acquired: Secondary | ICD-10-CM | POA: Diagnosis not present

## 2012-08-18 DIAGNOSIS — R339 Retention of urine, unspecified: Secondary | ICD-10-CM | POA: Diagnosis not present

## 2012-08-30 DIAGNOSIS — Z7901 Long term (current) use of anticoagulants: Secondary | ICD-10-CM | POA: Diagnosis not present

## 2012-08-30 DIAGNOSIS — I4891 Unspecified atrial fibrillation: Secondary | ICD-10-CM | POA: Diagnosis not present

## 2012-09-23 DIAGNOSIS — Z85820 Personal history of malignant melanoma of skin: Secondary | ICD-10-CM | POA: Diagnosis not present

## 2012-09-23 DIAGNOSIS — D239 Other benign neoplasm of skin, unspecified: Secondary | ICD-10-CM | POA: Diagnosis not present

## 2012-09-23 DIAGNOSIS — Z85828 Personal history of other malignant neoplasm of skin: Secondary | ICD-10-CM | POA: Diagnosis not present

## 2012-09-23 DIAGNOSIS — L821 Other seborrheic keratosis: Secondary | ICD-10-CM | POA: Diagnosis not present

## 2012-09-23 DIAGNOSIS — L819 Disorder of pigmentation, unspecified: Secondary | ICD-10-CM | POA: Diagnosis not present

## 2012-09-23 DIAGNOSIS — L738 Other specified follicular disorders: Secondary | ICD-10-CM | POA: Diagnosis not present

## 2012-09-23 DIAGNOSIS — L570 Actinic keratosis: Secondary | ICD-10-CM | POA: Diagnosis not present

## 2012-09-27 DIAGNOSIS — C439 Malignant melanoma of skin, unspecified: Secondary | ICD-10-CM | POA: Diagnosis not present

## 2012-09-27 DIAGNOSIS — I4891 Unspecified atrial fibrillation: Secondary | ICD-10-CM | POA: Diagnosis not present

## 2012-09-27 DIAGNOSIS — E785 Hyperlipidemia, unspecified: Secondary | ICD-10-CM | POA: Diagnosis not present

## 2012-09-27 DIAGNOSIS — N401 Enlarged prostate with lower urinary tract symptoms: Secondary | ICD-10-CM | POA: Diagnosis not present

## 2012-09-27 DIAGNOSIS — R339 Retention of urine, unspecified: Secondary | ICD-10-CM | POA: Diagnosis not present

## 2012-09-27 DIAGNOSIS — R269 Unspecified abnormalities of gait and mobility: Secondary | ICD-10-CM | POA: Diagnosis not present

## 2012-09-27 DIAGNOSIS — Z7901 Long term (current) use of anticoagulants: Secondary | ICD-10-CM | POA: Diagnosis not present

## 2012-09-27 DIAGNOSIS — G609 Hereditary and idiopathic neuropathy, unspecified: Secondary | ICD-10-CM | POA: Diagnosis not present

## 2012-10-15 DIAGNOSIS — I4891 Unspecified atrial fibrillation: Secondary | ICD-10-CM | POA: Diagnosis not present

## 2012-10-15 DIAGNOSIS — Z7901 Long term (current) use of anticoagulants: Secondary | ICD-10-CM | POA: Diagnosis not present

## 2012-11-11 DIAGNOSIS — Z6833 Body mass index (BMI) 33.0-33.9, adult: Secondary | ICD-10-CM | POA: Diagnosis not present

## 2012-11-11 DIAGNOSIS — G609 Hereditary and idiopathic neuropathy, unspecified: Secondary | ICD-10-CM | POA: Diagnosis not present

## 2012-11-11 DIAGNOSIS — R82998 Other abnormal findings in urine: Secondary | ICD-10-CM | POA: Diagnosis not present

## 2012-11-11 DIAGNOSIS — M549 Dorsalgia, unspecified: Secondary | ICD-10-CM | POA: Diagnosis not present

## 2012-11-11 DIAGNOSIS — M5137 Other intervertebral disc degeneration, lumbosacral region: Secondary | ICD-10-CM | POA: Diagnosis not present

## 2012-11-11 DIAGNOSIS — IMO0002 Reserved for concepts with insufficient information to code with codable children: Secondary | ICD-10-CM | POA: Diagnosis not present

## 2012-11-11 DIAGNOSIS — Z7901 Long term (current) use of anticoagulants: Secondary | ICD-10-CM | POA: Diagnosis not present

## 2012-11-11 DIAGNOSIS — I4891 Unspecified atrial fibrillation: Secondary | ICD-10-CM | POA: Diagnosis not present

## 2012-11-25 DIAGNOSIS — Z7901 Long term (current) use of anticoagulants: Secondary | ICD-10-CM | POA: Diagnosis not present

## 2012-11-25 DIAGNOSIS — I4891 Unspecified atrial fibrillation: Secondary | ICD-10-CM | POA: Diagnosis not present

## 2013-01-06 DIAGNOSIS — Z7901 Long term (current) use of anticoagulants: Secondary | ICD-10-CM | POA: Diagnosis not present

## 2013-01-06 DIAGNOSIS — I4891 Unspecified atrial fibrillation: Secondary | ICD-10-CM | POA: Diagnosis not present

## 2013-01-25 DIAGNOSIS — H52209 Unspecified astigmatism, unspecified eye: Secondary | ICD-10-CM | POA: Diagnosis not present

## 2013-01-25 DIAGNOSIS — Z961 Presence of intraocular lens: Secondary | ICD-10-CM | POA: Diagnosis not present

## 2013-01-25 DIAGNOSIS — H524 Presbyopia: Secondary | ICD-10-CM | POA: Diagnosis not present

## 2013-01-28 DIAGNOSIS — I4891 Unspecified atrial fibrillation: Secondary | ICD-10-CM | POA: Diagnosis not present

## 2013-01-28 DIAGNOSIS — Z7901 Long term (current) use of anticoagulants: Secondary | ICD-10-CM | POA: Diagnosis not present

## 2013-02-24 DIAGNOSIS — R0602 Shortness of breath: Secondary | ICD-10-CM | POA: Diagnosis not present

## 2013-02-24 DIAGNOSIS — E785 Hyperlipidemia, unspecified: Secondary | ICD-10-CM | POA: Diagnosis not present

## 2013-02-24 DIAGNOSIS — Z7901 Long term (current) use of anticoagulants: Secondary | ICD-10-CM | POA: Diagnosis not present

## 2013-02-24 DIAGNOSIS — I4891 Unspecified atrial fibrillation: Secondary | ICD-10-CM | POA: Diagnosis not present

## 2013-02-24 DIAGNOSIS — I10 Essential (primary) hypertension: Secondary | ICD-10-CM | POA: Diagnosis not present

## 2013-02-25 DIAGNOSIS — N401 Enlarged prostate with lower urinary tract symptoms: Secondary | ICD-10-CM | POA: Diagnosis not present

## 2013-02-25 DIAGNOSIS — R339 Retention of urine, unspecified: Secondary | ICD-10-CM | POA: Diagnosis not present

## 2013-02-25 DIAGNOSIS — N529 Male erectile dysfunction, unspecified: Secondary | ICD-10-CM | POA: Diagnosis not present

## 2013-03-08 DIAGNOSIS — I4891 Unspecified atrial fibrillation: Secondary | ICD-10-CM | POA: Diagnosis not present

## 2013-03-08 DIAGNOSIS — Z7901 Long term (current) use of anticoagulants: Secondary | ICD-10-CM | POA: Diagnosis not present

## 2013-03-29 DIAGNOSIS — L608 Other nail disorders: Secondary | ICD-10-CM | POA: Diagnosis not present

## 2013-04-04 DIAGNOSIS — D0470 Carcinoma in situ of skin of unspecified lower limb, including hip: Secondary | ICD-10-CM | POA: Diagnosis not present

## 2013-04-05 DIAGNOSIS — Z7901 Long term (current) use of anticoagulants: Secondary | ICD-10-CM | POA: Diagnosis not present

## 2013-04-05 DIAGNOSIS — I4891 Unspecified atrial fibrillation: Secondary | ICD-10-CM | POA: Diagnosis not present

## 2013-04-07 DIAGNOSIS — R82998 Other abnormal findings in urine: Secondary | ICD-10-CM | POA: Diagnosis not present

## 2013-04-07 DIAGNOSIS — E785 Hyperlipidemia, unspecified: Secondary | ICD-10-CM | POA: Diagnosis not present

## 2013-04-07 DIAGNOSIS — Z125 Encounter for screening for malignant neoplasm of prostate: Secondary | ICD-10-CM | POA: Diagnosis not present

## 2013-04-07 DIAGNOSIS — R7309 Other abnormal glucose: Secondary | ICD-10-CM | POA: Diagnosis not present

## 2013-04-07 DIAGNOSIS — R809 Proteinuria, unspecified: Secondary | ICD-10-CM | POA: Diagnosis not present

## 2013-04-14 DIAGNOSIS — E669 Obesity, unspecified: Secondary | ICD-10-CM | POA: Diagnosis not present

## 2013-04-14 DIAGNOSIS — Q8503 Schwannomatosis: Secondary | ICD-10-CM | POA: Diagnosis not present

## 2013-04-14 DIAGNOSIS — Z0000 Encounter for general adult medical examination without abnormal findings: Secondary | ICD-10-CM | POA: Diagnosis not present

## 2013-04-14 DIAGNOSIS — Z7901 Long term (current) use of anticoagulants: Secondary | ICD-10-CM | POA: Diagnosis not present

## 2013-04-14 DIAGNOSIS — Z1331 Encounter for screening for depression: Secondary | ICD-10-CM | POA: Diagnosis not present

## 2013-04-14 DIAGNOSIS — R339 Retention of urine, unspecified: Secondary | ICD-10-CM | POA: Diagnosis not present

## 2013-04-14 DIAGNOSIS — G609 Hereditary and idiopathic neuropathy, unspecified: Secondary | ICD-10-CM | POA: Diagnosis not present

## 2013-04-14 DIAGNOSIS — Z23 Encounter for immunization: Secondary | ICD-10-CM | POA: Diagnosis not present

## 2013-04-14 DIAGNOSIS — R269 Unspecified abnormalities of gait and mobility: Secondary | ICD-10-CM | POA: Diagnosis not present

## 2013-04-14 DIAGNOSIS — I4891 Unspecified atrial fibrillation: Secondary | ICD-10-CM | POA: Diagnosis not present

## 2013-04-21 DIAGNOSIS — Z1212 Encounter for screening for malignant neoplasm of rectum: Secondary | ICD-10-CM | POA: Diagnosis not present

## 2013-05-13 ENCOUNTER — Telehealth: Payer: Self-pay | Admitting: Interventional Cardiology

## 2013-05-13 MED ORDER — WARFARIN SODIUM 5 MG PO TABS: ORAL_TABLET | ORAL | Status: DC

## 2013-05-13 NOTE — Telephone Encounter (Signed)
Patient notified medication refilled.  Refill escribed to [facility].

## 2013-05-13 NOTE — Telephone Encounter (Signed)
New message     [facility]----Call pt and let him know it has been called in.

## 2013-05-17 ENCOUNTER — Ambulatory Visit (INDEPENDENT_AMBULATORY_CARE_PROVIDER_SITE_OTHER): Payer: Medicare Other | Admitting: Pharmacist

## 2013-05-17 ENCOUNTER — Telehealth: Payer: Self-pay | Admitting: Interventional Cardiology

## 2013-05-17 DIAGNOSIS — I4891 Unspecified atrial fibrillation: Secondary | ICD-10-CM | POA: Diagnosis not present

## 2013-05-17 LAB — POCT INR: INR: 3.2

## 2013-05-17 NOTE — Telephone Encounter (Signed)
Patient notified that stopping avodart would not have impacted his INR.  Med list updated.

## 2013-05-17 NOTE — Telephone Encounter (Signed)
New message     Saw Peter Singh this am---forgot to tell him that he has been off avodart for 1 month.  Do not know if that has something to do with his pt/inr reading today.

## 2013-05-30 DIAGNOSIS — M21619 Bunion of unspecified foot: Secondary | ICD-10-CM | POA: Diagnosis not present

## 2013-05-30 DIAGNOSIS — L97509 Non-pressure chronic ulcer of other part of unspecified foot with unspecified severity: Secondary | ICD-10-CM | POA: Diagnosis not present

## 2013-06-14 ENCOUNTER — Ambulatory Visit (INDEPENDENT_AMBULATORY_CARE_PROVIDER_SITE_OTHER): Payer: Medicare Other | Admitting: Pharmacist

## 2013-06-14 DIAGNOSIS — I4891 Unspecified atrial fibrillation: Secondary | ICD-10-CM

## 2013-06-14 LAB — POCT INR: INR: 2.8

## 2013-06-20 ENCOUNTER — Telehealth: Payer: Self-pay | Admitting: Pharmacist

## 2013-06-20 NOTE — Telephone Encounter (Signed)
Patient aware.

## 2013-06-20 NOTE — Telephone Encounter (Signed)
Message copied by Lou Miner on Mon Jun 20, 2013  9:15 AM ------      Message from: Verdis Prime      Created: Sun Jun 19, 2013  2:50 PM      Regarding: RE: foot surgery       Ok.      ----- Message -----         From: Gaspar Skeeters Sonakshi Rolland, RPH         Sent: 06/14/2013   9:22 AM           To: Lesleigh Noe, MD      Subject: foot surgery                                             Patient having foot surgery by [facility] (Dr. Luberta Mutter) on 07/04/13 for a small hematoma that has persisted on his left foot.  I've advised patient to stop warfarin 5 days prior.              Do you agree, or have any objection to this?              Thanks,      Riki Rusk       ------

## 2013-06-21 ENCOUNTER — Telehealth: Payer: Self-pay | Admitting: Pharmacist

## 2013-06-21 NOTE — Telephone Encounter (Signed)
ok 

## 2013-06-21 NOTE — Telephone Encounter (Signed)
Patient has to insert a catheter into his bladder every night to void urine, and apparently last night he got scratched inserting catheter, and this morning there is some hematuria.  Dr. Retta Diones informed patient to talk to our office about stopping coumadin.  I advised patient to hold warfarin tonight and if hematuria has resolved to restart tomorrow evening.  If hematuria still present tomorrow okay to hold for another day.  Patient to call us back Thursday if hematuria still present.  FYI to Dr. Katrinka Blazing.

## 2013-06-21 NOTE — Telephone Encounter (Signed)
New Problem:  Pt states he would like to speak to [place]. Pt states he will give more details when [place] calls.

## 2013-06-23 ENCOUNTER — Telehealth: Payer: Self-pay | Admitting: Pharmacist

## 2013-06-23 NOTE — Telephone Encounter (Signed)
Patient wanted to ask Dr. Katrinka Blazing if he was a candidate for an ablation procedure.  He has spoken with some friends who were on coumadin for AFib / AFL and had a successful ablation.  I explained to him that one of the EP physicians may need to make this assessment and he completely understood.  He just wanted to know what Dr. Michaelle Copas opinion was, so I am forwarding this to him.  To Dr. Katrinka Blazing.

## 2013-06-23 NOTE — Telephone Encounter (Signed)
Probably not a candidate due to the prolonged duration of AF in his case

## 2013-06-24 NOTE — Telephone Encounter (Signed)
Follow up     Returning call from [facility].

## 2013-06-24 NOTE — Telephone Encounter (Signed)
Contacted patient and informed him that Dr. Katrinka Blazing doesn't feel he is a candidate for ablation due to length of time he has been in AFib.  Patient shows verbal understanding and is okay with this assessment.

## 2013-06-28 DIAGNOSIS — L97509 Non-pressure chronic ulcer of other part of unspecified foot with unspecified severity: Secondary | ICD-10-CM | POA: Diagnosis not present

## 2013-06-28 DIAGNOSIS — L608 Other nail disorders: Secondary | ICD-10-CM | POA: Diagnosis not present

## 2013-06-28 DIAGNOSIS — S92309A Fracture of unspecified metatarsal bone(s), unspecified foot, initial encounter for closed fracture: Secondary | ICD-10-CM | POA: Diagnosis not present

## 2013-06-28 DIAGNOSIS — M21619 Bunion of unspecified foot: Secondary | ICD-10-CM | POA: Diagnosis not present

## 2013-06-29 DIAGNOSIS — Z01818 Encounter for other preprocedural examination: Secondary | ICD-10-CM | POA: Diagnosis not present

## 2013-06-29 DIAGNOSIS — R209 Unspecified disturbances of skin sensation: Secondary | ICD-10-CM | POA: Diagnosis not present

## 2013-06-29 DIAGNOSIS — Z09 Encounter for follow-up examination after completed treatment for conditions other than malignant neoplasm: Secondary | ICD-10-CM | POA: Diagnosis not present

## 2013-07-04 DIAGNOSIS — M21619 Bunion of unspecified foot: Secondary | ICD-10-CM | POA: Diagnosis not present

## 2013-07-04 DIAGNOSIS — M216X9 Other acquired deformities of unspecified foot: Secondary | ICD-10-CM | POA: Diagnosis not present

## 2013-07-08 DIAGNOSIS — M21619 Bunion of unspecified foot: Secondary | ICD-10-CM | POA: Diagnosis not present

## 2013-07-26 ENCOUNTER — Ambulatory Visit (INDEPENDENT_AMBULATORY_CARE_PROVIDER_SITE_OTHER): Payer: Medicare Other | Admitting: Pharmacist

## 2013-07-26 DIAGNOSIS — I4891 Unspecified atrial fibrillation: Secondary | ICD-10-CM | POA: Diagnosis not present

## 2013-07-26 LAB — POCT INR: INR: 2.4

## 2013-07-29 DIAGNOSIS — M21619 Bunion of unspecified foot: Secondary | ICD-10-CM | POA: Diagnosis not present

## 2013-07-29 DIAGNOSIS — Z09 Encounter for follow-up examination after completed treatment for conditions other than malignant neoplasm: Secondary | ICD-10-CM | POA: Diagnosis not present

## 2013-08-09 DIAGNOSIS — Z09 Encounter for follow-up examination after completed treatment for conditions other than malignant neoplasm: Secondary | ICD-10-CM | POA: Diagnosis not present

## 2013-08-09 DIAGNOSIS — M21619 Bunion of unspecified foot: Secondary | ICD-10-CM | POA: Diagnosis not present

## 2013-08-24 DIAGNOSIS — Z09 Encounter for follow-up examination after completed treatment for conditions other than malignant neoplasm: Secondary | ICD-10-CM | POA: Diagnosis not present

## 2013-08-24 DIAGNOSIS — M21619 Bunion of unspecified foot: Secondary | ICD-10-CM | POA: Diagnosis not present

## 2013-08-24 DIAGNOSIS — L608 Other nail disorders: Secondary | ICD-10-CM | POA: Diagnosis not present

## 2013-09-02 ENCOUNTER — Telehealth: Payer: Self-pay | Admitting: *Deleted

## 2013-09-02 MED ORDER — WARFARIN SODIUM 5 MG PO TABS
ORAL_TABLET | ORAL | Status: DC
Start: 2013-09-02 — End: 2013-12-15

## 2013-09-02 NOTE — Telephone Encounter (Signed)
Patient [facility] Thanks, MI

## 2013-10-03 DIAGNOSIS — D485 Neoplasm of uncertain behavior of skin: Secondary | ICD-10-CM | POA: Diagnosis not present

## 2013-10-03 DIAGNOSIS — D045 Carcinoma in situ of skin of trunk: Secondary | ICD-10-CM | POA: Diagnosis not present

## 2013-10-03 DIAGNOSIS — L259 Unspecified contact dermatitis, unspecified cause: Secondary | ICD-10-CM | POA: Diagnosis not present

## 2013-10-03 DIAGNOSIS — L821 Other seborrheic keratosis: Secondary | ICD-10-CM | POA: Diagnosis not present

## 2013-10-03 DIAGNOSIS — Z85820 Personal history of malignant melanoma of skin: Secondary | ICD-10-CM | POA: Diagnosis not present

## 2013-10-03 DIAGNOSIS — Z85828 Personal history of other malignant neoplasm of skin: Secondary | ICD-10-CM | POA: Diagnosis not present

## 2013-10-03 DIAGNOSIS — L819 Disorder of pigmentation, unspecified: Secondary | ICD-10-CM | POA: Diagnosis not present

## 2013-10-03 DIAGNOSIS — L570 Actinic keratosis: Secondary | ICD-10-CM | POA: Diagnosis not present

## 2013-10-24 DIAGNOSIS — D72819 Decreased white blood cell count, unspecified: Secondary | ICD-10-CM | POA: Diagnosis not present

## 2013-10-24 DIAGNOSIS — I4891 Unspecified atrial fibrillation: Secondary | ICD-10-CM | POA: Diagnosis not present

## 2013-10-24 DIAGNOSIS — E669 Obesity, unspecified: Secondary | ICD-10-CM | POA: Diagnosis not present

## 2013-10-24 DIAGNOSIS — E785 Hyperlipidemia, unspecified: Secondary | ICD-10-CM | POA: Diagnosis not present

## 2013-10-24 DIAGNOSIS — G609 Hereditary and idiopathic neuropathy, unspecified: Secondary | ICD-10-CM | POA: Diagnosis not present

## 2013-10-24 DIAGNOSIS — D696 Thrombocytopenia, unspecified: Secondary | ICD-10-CM | POA: Diagnosis not present

## 2013-10-24 DIAGNOSIS — Z7901 Long term (current) use of anticoagulants: Secondary | ICD-10-CM | POA: Diagnosis not present

## 2013-11-30 DIAGNOSIS — G63 Polyneuropathy in diseases classified elsewhere: Secondary | ICD-10-CM | POA: Diagnosis not present

## 2013-11-30 DIAGNOSIS — R269 Unspecified abnormalities of gait and mobility: Secondary | ICD-10-CM | POA: Diagnosis not present

## 2013-11-30 DIAGNOSIS — R279 Unspecified lack of coordination: Secondary | ICD-10-CM | POA: Diagnosis not present

## 2013-12-02 DIAGNOSIS — R269 Unspecified abnormalities of gait and mobility: Secondary | ICD-10-CM | POA: Diagnosis not present

## 2013-12-02 DIAGNOSIS — R279 Unspecified lack of coordination: Secondary | ICD-10-CM | POA: Diagnosis not present

## 2013-12-02 DIAGNOSIS — G63 Polyneuropathy in diseases classified elsewhere: Secondary | ICD-10-CM | POA: Diagnosis not present

## 2013-12-05 DIAGNOSIS — G63 Polyneuropathy in diseases classified elsewhere: Secondary | ICD-10-CM | POA: Diagnosis not present

## 2013-12-05 DIAGNOSIS — R279 Unspecified lack of coordination: Secondary | ICD-10-CM | POA: Diagnosis not present

## 2013-12-05 DIAGNOSIS — R269 Unspecified abnormalities of gait and mobility: Secondary | ICD-10-CM | POA: Diagnosis not present

## 2013-12-07 DIAGNOSIS — R279 Unspecified lack of coordination: Secondary | ICD-10-CM | POA: Diagnosis not present

## 2013-12-07 DIAGNOSIS — G63 Polyneuropathy in diseases classified elsewhere: Secondary | ICD-10-CM | POA: Diagnosis not present

## 2013-12-07 DIAGNOSIS — R269 Unspecified abnormalities of gait and mobility: Secondary | ICD-10-CM | POA: Diagnosis not present

## 2013-12-09 DIAGNOSIS — R279 Unspecified lack of coordination: Secondary | ICD-10-CM | POA: Diagnosis not present

## 2013-12-09 DIAGNOSIS — R269 Unspecified abnormalities of gait and mobility: Secondary | ICD-10-CM | POA: Diagnosis not present

## 2013-12-09 DIAGNOSIS — G63 Polyneuropathy in diseases classified elsewhere: Secondary | ICD-10-CM | POA: Diagnosis not present

## 2013-12-12 DIAGNOSIS — G63 Polyneuropathy in diseases classified elsewhere: Secondary | ICD-10-CM | POA: Diagnosis not present

## 2013-12-12 DIAGNOSIS — R269 Unspecified abnormalities of gait and mobility: Secondary | ICD-10-CM | POA: Diagnosis not present

## 2013-12-12 DIAGNOSIS — R279 Unspecified lack of coordination: Secondary | ICD-10-CM | POA: Diagnosis not present

## 2013-12-13 ENCOUNTER — Other Ambulatory Visit: Payer: Self-pay | Admitting: *Deleted

## 2013-12-15 ENCOUNTER — Other Ambulatory Visit: Payer: Self-pay | Admitting: *Deleted

## 2013-12-15 DIAGNOSIS — G63 Polyneuropathy in diseases classified elsewhere: Secondary | ICD-10-CM | POA: Diagnosis not present

## 2013-12-15 DIAGNOSIS — R269 Unspecified abnormalities of gait and mobility: Secondary | ICD-10-CM | POA: Diagnosis not present

## 2013-12-15 DIAGNOSIS — R279 Unspecified lack of coordination: Secondary | ICD-10-CM | POA: Diagnosis not present

## 2013-12-15 MED ORDER — WARFARIN SODIUM 5 MG PO TABS
ORAL_TABLET | ORAL | Status: DC
Start: 2013-12-15 — End: 2014-03-24

## 2013-12-15 NOTE — Telephone Encounter (Signed)
Called patient for coumadin appt, needs refill

## 2013-12-16 ENCOUNTER — Ambulatory Visit (INDEPENDENT_AMBULATORY_CARE_PROVIDER_SITE_OTHER): Payer: Medicare Other | Admitting: *Deleted

## 2013-12-16 DIAGNOSIS — I4891 Unspecified atrial fibrillation: Secondary | ICD-10-CM | POA: Diagnosis not present

## 2013-12-16 DIAGNOSIS — R269 Unspecified abnormalities of gait and mobility: Secondary | ICD-10-CM | POA: Diagnosis not present

## 2013-12-16 DIAGNOSIS — G63 Polyneuropathy in diseases classified elsewhere: Secondary | ICD-10-CM | POA: Diagnosis not present

## 2013-12-16 DIAGNOSIS — R279 Unspecified lack of coordination: Secondary | ICD-10-CM | POA: Diagnosis not present

## 2013-12-16 LAB — POCT INR: INR: 2.7

## 2013-12-17 ENCOUNTER — Telehealth: Payer: Self-pay | Admitting: Physician Assistant

## 2013-12-17 DIAGNOSIS — I4891 Unspecified atrial fibrillation: Secondary | ICD-10-CM

## 2013-12-17 MED ORDER — WARFARIN SODIUM 5 MG PO TABS: ORAL_TABLET | ORAL | Status: DC

## 2013-12-17 NOTE — Telephone Encounter (Signed)
Peter Singh is a 80 y.o. male needs Warfarin refilled.  There has been a delay getting his medicine refilled by his mail order pharmacy.  I have sent a Rx for Warfarin 5 mg to [facility], [place]   12/17/2013 3:06 PM

## 2013-12-19 DIAGNOSIS — R269 Unspecified abnormalities of gait and mobility: Secondary | ICD-10-CM | POA: Diagnosis not present

## 2013-12-19 DIAGNOSIS — R279 Unspecified lack of coordination: Secondary | ICD-10-CM | POA: Diagnosis not present

## 2013-12-19 DIAGNOSIS — G63 Polyneuropathy in diseases classified elsewhere: Secondary | ICD-10-CM | POA: Diagnosis not present

## 2013-12-20 DIAGNOSIS — G63 Polyneuropathy in diseases classified elsewhere: Secondary | ICD-10-CM | POA: Diagnosis not present

## 2013-12-20 DIAGNOSIS — R279 Unspecified lack of coordination: Secondary | ICD-10-CM | POA: Diagnosis not present

## 2013-12-20 DIAGNOSIS — R269 Unspecified abnormalities of gait and mobility: Secondary | ICD-10-CM | POA: Diagnosis not present

## 2013-12-22 DIAGNOSIS — G63 Polyneuropathy in diseases classified elsewhere: Secondary | ICD-10-CM | POA: Diagnosis not present

## 2013-12-22 DIAGNOSIS — R269 Unspecified abnormalities of gait and mobility: Secondary | ICD-10-CM | POA: Diagnosis not present

## 2013-12-22 DIAGNOSIS — R279 Unspecified lack of coordination: Secondary | ICD-10-CM | POA: Diagnosis not present

## 2013-12-29 DIAGNOSIS — R279 Unspecified lack of coordination: Secondary | ICD-10-CM | POA: Diagnosis not present

## 2013-12-29 DIAGNOSIS — R269 Unspecified abnormalities of gait and mobility: Secondary | ICD-10-CM | POA: Diagnosis not present

## 2013-12-29 DIAGNOSIS — G63 Polyneuropathy in diseases classified elsewhere: Secondary | ICD-10-CM | POA: Diagnosis not present

## 2014-01-09 ENCOUNTER — Telehealth: Payer: Self-pay | Admitting: Interventional Cardiology

## 2014-01-09 NOTE — Telephone Encounter (Signed)
New message     For Peter Singh On coumadin, what can he take for back pain---aleve, advil or tylenol?

## 2014-01-09 NOTE — Telephone Encounter (Signed)
Returned call to pt, advised can take Tylenol prn for back pain, no interaction.  Advised IBU and NSAIDS can irritate stomach lining and increase risks of internal bleeding.  Pt verbalized understanding.

## 2014-01-27 ENCOUNTER — Ambulatory Visit (INDEPENDENT_AMBULATORY_CARE_PROVIDER_SITE_OTHER): Payer: Medicare Other | Admitting: Pharmacist

## 2014-01-27 DIAGNOSIS — I4891 Unspecified atrial fibrillation: Secondary | ICD-10-CM

## 2014-01-27 LAB — POCT INR: INR: 3.5

## 2014-02-17 ENCOUNTER — Ambulatory Visit (INDEPENDENT_AMBULATORY_CARE_PROVIDER_SITE_OTHER): Payer: Medicare Other | Admitting: *Deleted

## 2014-02-17 DIAGNOSIS — I4891 Unspecified atrial fibrillation: Secondary | ICD-10-CM

## 2014-02-17 LAB — POCT INR: INR: 2.9

## 2014-03-01 DIAGNOSIS — Z961 Presence of intraocular lens: Secondary | ICD-10-CM | POA: Diagnosis not present

## 2014-03-01 DIAGNOSIS — H52209 Unspecified astigmatism, unspecified eye: Secondary | ICD-10-CM | POA: Diagnosis not present

## 2014-03-01 DIAGNOSIS — H524 Presbyopia: Secondary | ICD-10-CM | POA: Diagnosis not present

## 2014-03-10 ENCOUNTER — Ambulatory Visit (INDEPENDENT_AMBULATORY_CARE_PROVIDER_SITE_OTHER): Payer: Medicare Other | Admitting: Pharmacist

## 2014-03-10 DIAGNOSIS — I4891 Unspecified atrial fibrillation: Secondary | ICD-10-CM

## 2014-03-10 LAB — POCT INR: INR: 2.4

## 2014-03-14 ENCOUNTER — Telehealth: Payer: Self-pay | Admitting: *Deleted

## 2014-03-14 MED ORDER — METOPROLOL SUCCINATE ER 50 MG PO TB24: 50.0000 mg | ORAL_TABLET | Freq: Every day | ORAL | Status: DC

## 2014-03-14 NOTE — Telephone Encounter (Signed)
Metoprolol refilled 90 R-0

## 2014-03-14 NOTE — Telephone Encounter (Signed)
Patient [facility] Thanks, MI

## 2014-03-24 ENCOUNTER — Other Ambulatory Visit: Payer: Self-pay | Admitting: *Deleted

## 2014-03-24 ENCOUNTER — Encounter: Payer: Self-pay | Admitting: Interventional Cardiology

## 2014-03-24 ENCOUNTER — Ambulatory Visit (INDEPENDENT_AMBULATORY_CARE_PROVIDER_SITE_OTHER): Payer: Medicare Other | Admitting: Interventional Cardiology

## 2014-03-24 VITALS — BP 106/74 | HR 61 | Ht 79.0 in | Wt 280.0 lb

## 2014-03-24 DIAGNOSIS — E785 Hyperlipidemia, unspecified: Secondary | ICD-10-CM | POA: Diagnosis not present

## 2014-03-24 DIAGNOSIS — I4891 Unspecified atrial fibrillation: Secondary | ICD-10-CM | POA: Diagnosis not present

## 2014-03-24 DIAGNOSIS — I482 Chronic atrial fibrillation, unspecified: Secondary | ICD-10-CM

## 2014-03-24 DIAGNOSIS — I10 Essential (primary) hypertension: Secondary | ICD-10-CM

## 2014-03-24 DIAGNOSIS — Z7901 Long term (current) use of anticoagulants: Secondary | ICD-10-CM | POA: Diagnosis not present

## 2014-03-24 MED ORDER — METOPROLOL SUCCINATE ER 25 MG PO TB24: 50.0000 mg | ORAL_TABLET | Freq: Every day | ORAL | Status: DC

## 2014-03-24 NOTE — Patient Instructions (Signed)
Your physician has recommended you make the following change in your medication:  1) REDUCE Metoprolol to 25mg  daily  Your physician has recommended that you wear a holter monitor. Holter monitors are medical devices that record the heart's electrical activity. Doctors most often use these monitors to diagnose arrhythmias. Arrhythmias are problems with the speed or rhythm of the heartbeat. The monitor is a small, portable device. You can wear one while you do your normal daily activities. This is usually used to diagnose what is causing palpitations/syncope (passing out).( To be scheduled for 10-14 days from today)  Your physician wants you to follow-up in: 1 year with Dr.Smith You will receive a reminder letter in the mail two months in advance. If you don't receive a letter, please call our office to schedule the follow-up appointment.

## 2014-03-24 NOTE — Progress Notes (Signed)
Patient ID: Peter Singh, male   DOB: 1931-12-01, 80 y.o.   MRN: [id number]    [address], [address] [place], [place]  [address] Phone: [phone] Fax:  [phone]  Date:  03/24/2014   ID:  Peter Singh, DOB 28-May-1932, MRN [id number]  PCP:  Precious Reel, MD   ASSESSMENT:  1. Exertional fatigue and dyspnea. Rule out chronotropic incompetence 2. Atrial fibrillation with slow ventricular response 3. Hypertension with excellent control 4. Chronic anticoagulation without complications  PLAN:  1. decrease metoprolol succinate 25 mg daily 2. After 2 weeks, 24-hour Holter monitor to assess chronotropic competence 3. If an adequate heart rate response to physical activity, will discontinue beta blocker therapy 4. Clinical followup in one year   SUBJECTIVE: Peter Singh is a 80 y.o. male whose main complaint is exertional intolerance and dyspnea. He denies dyspnea at rest and orthopnea. There is no peripheral edema. He has not had syncope. He has difficulty ambulating because he is weak and tired. He denies palpitations. No transient neurological symptoms. No blood in the sure in the stool.   Wt Readings from Last 3 Encounters:  03/24/14 280 lb (127.007 kg)  04/08/11 268 lb 6.4 oz (121.745 kg)     Past Medical History  Diagnosis Date  . Diverticulosis of colon (without mention of hemorrhage)   . Personal history of colonic polyps 1999 & 2004    adenomatous polyps  . Hyperlipemia   . Melanoma   . Atrial fibrillation   . Ventricular hypertrophy     Current Outpatient Prescriptions  Medication Sig Dispense Refill  . atorvastatin (LIPITOR) 20 MG tablet Take 20 mg by mouth daily.        . hydrochlorothiazide (HYDRODIURIL) 25 MG tablet Take 25 mg by mouth daily.        . hydrocodone-acetaminophen (ZYDONE) 5-400 MG per tablet Take 1 tablet by mouth every 6 (six) hours as needed for pain.      . metoprolol succinate (TOPROL-XL) 50 MG 24 hr tablet Take 1 tablet (50  mg total) by mouth daily.  90 tablet  0  . Tamsulosin HCl (FLOMAX) 0.4 MG CAPS Take 0.4 mg by mouth 2 (two) times daily.        Marland Kitchen warfarin (COUMADIN) 5 MG tablet Take as directed by the coumadin clinic  30 tablet  0   No current facility-administered medications for this visit.    Allergies:    Allergies  Allergen Reactions  . Penicillins Rash    Social History:  The patient  reports that he quit smoking about 38 years ago. He has never used smokeless tobacco. He reports that he drinks alcohol. He reports that he does not use illicit drugs.   ROS:  Please see the history of present illness.   Appetite is stable. No falls or head trauma. No peripheral edema.   All other systems reviewed and negative.   OBJECTIVE: VS:  BP 106/74  Pulse 61  Ht 6\' 7"  (2.007 m)  Wt 280 lb (127.007 kg)  BMI 31.53 kg/m2 Well nourished, well developed, in no acute distress, obese, elderly HEENT: normal Neck: JVD flat. Carotid bruit absent  Cardiac:  normal S1, S2; IIRR; no murmur Lungs:  clear to auscultation bilaterally, no wheezing, rhonchi or rales Abd: soft, nontender, no hepatomegaly Ext: Edema none. Pulses 2+ bilateral Skin: warm and dry Neuro:  CNs 2-12 intact, no focal abnormalities noted  EKG:  Atrial fibrillation with slow ventricular response, left axis  deviation, and poor R-wave progression. This is unchanged when compared to prior tracings on file at [facility], [place], MD 03/24/2014 10:16 AM

## 2014-04-03 DIAGNOSIS — D1801 Hemangioma of skin and subcutaneous tissue: Secondary | ICD-10-CM | POA: Diagnosis not present

## 2014-04-03 DIAGNOSIS — Z85820 Personal history of malignant melanoma of skin: Secondary | ICD-10-CM | POA: Diagnosis not present

## 2014-04-03 DIAGNOSIS — D044 Carcinoma in situ of skin of scalp and neck: Secondary | ICD-10-CM | POA: Diagnosis not present

## 2014-04-03 DIAGNOSIS — D485 Neoplasm of uncertain behavior of skin: Secondary | ICD-10-CM | POA: Diagnosis not present

## 2014-04-03 DIAGNOSIS — Z85828 Personal history of other malignant neoplasm of skin: Secondary | ICD-10-CM | POA: Diagnosis not present

## 2014-04-03 DIAGNOSIS — L821 Other seborrheic keratosis: Secondary | ICD-10-CM | POA: Diagnosis not present

## 2014-04-03 DIAGNOSIS — L570 Actinic keratosis: Secondary | ICD-10-CM | POA: Diagnosis not present

## 2014-04-07 ENCOUNTER — Ambulatory Visit (INDEPENDENT_AMBULATORY_CARE_PROVIDER_SITE_OTHER): Payer: Medicare Other | Admitting: *Deleted

## 2014-04-07 DIAGNOSIS — I4891 Unspecified atrial fibrillation: Secondary | ICD-10-CM

## 2014-04-07 LAB — POCT INR: INR: 2.6

## 2014-04-13 ENCOUNTER — Encounter: Payer: Self-pay | Admitting: Radiology

## 2014-04-13 NOTE — Progress Notes (Signed)
Patient ID: Peter Singh, male   DOB: Jan 08, 1932, 80 y.o.   MRN: [id number] Patient did not show up for 04/13/2014 8:30 AM appointment to have his 24 hour holter monitor applied.

## 2014-04-14 ENCOUNTER — Other Ambulatory Visit: Payer: Self-pay | Admitting: *Deleted

## 2014-04-14 ENCOUNTER — Other Ambulatory Visit: Payer: Self-pay | Admitting: Pharmacist

## 2014-04-14 DIAGNOSIS — I4891 Unspecified atrial fibrillation: Secondary | ICD-10-CM

## 2014-04-14 MED ORDER — WARFARIN SODIUM 5 MG PO TABS: ORAL_TABLET | ORAL | Status: DC

## 2014-04-14 MED ORDER — METOPROLOL SUCCINATE ER 25 MG PO TB24: 25.0000 mg | ORAL_TABLET | Freq: Every day | ORAL | Status: DC

## 2014-04-17 ENCOUNTER — Encounter (INDEPENDENT_AMBULATORY_CARE_PROVIDER_SITE_OTHER): Payer: Medicare Other

## 2014-04-17 ENCOUNTER — Encounter: Payer: Self-pay | Admitting: *Deleted

## 2014-04-17 ENCOUNTER — Other Ambulatory Visit: Payer: Self-pay | Admitting: Interventional Cardiology

## 2014-04-17 DIAGNOSIS — Z79899 Other long term (current) drug therapy: Secondary | ICD-10-CM | POA: Diagnosis not present

## 2014-04-17 DIAGNOSIS — R8299 Other abnormal findings in urine: Secondary | ICD-10-CM | POA: Diagnosis not present

## 2014-04-17 DIAGNOSIS — R739 Hyperglycemia, unspecified: Secondary | ICD-10-CM | POA: Diagnosis not present

## 2014-04-17 DIAGNOSIS — I480 Paroxysmal atrial fibrillation: Secondary | ICD-10-CM | POA: Diagnosis not present

## 2014-04-17 DIAGNOSIS — Z125 Encounter for screening for malignant neoplasm of prostate: Secondary | ICD-10-CM | POA: Diagnosis not present

## 2014-04-17 DIAGNOSIS — I4891 Unspecified atrial fibrillation: Secondary | ICD-10-CM

## 2014-04-17 DIAGNOSIS — E785 Hyperlipidemia, unspecified: Secondary | ICD-10-CM | POA: Diagnosis not present

## 2014-04-17 NOTE — Progress Notes (Signed)
Patient ID: Peter Singh, male   DOB: 18-Aug-1931, 80 y.o.   MRN: [id number] [employer] 24 hour holter monitor applied to patient.

## 2014-04-19 DIAGNOSIS — Z23 Encounter for immunization: Secondary | ICD-10-CM | POA: Diagnosis not present

## 2014-04-24 DIAGNOSIS — R0609 Other forms of dyspnea: Secondary | ICD-10-CM | POA: Diagnosis not present

## 2014-04-24 DIAGNOSIS — Z008 Encounter for other general examination: Secondary | ICD-10-CM | POA: Diagnosis not present

## 2014-04-24 DIAGNOSIS — Z0000 Encounter for general adult medical examination without abnormal findings: Secondary | ICD-10-CM | POA: Diagnosis not present

## 2014-04-24 DIAGNOSIS — D696 Thrombocytopenia, unspecified: Secondary | ICD-10-CM | POA: Diagnosis not present

## 2014-04-24 DIAGNOSIS — M5136 Other intervertebral disc degeneration, lumbar region: Secondary | ICD-10-CM | POA: Diagnosis not present

## 2014-04-24 DIAGNOSIS — R339 Retention of urine, unspecified: Secondary | ICD-10-CM | POA: Diagnosis not present

## 2014-04-24 DIAGNOSIS — D72819 Decreased white blood cell count, unspecified: Secondary | ICD-10-CM | POA: Diagnosis not present

## 2014-04-24 DIAGNOSIS — Z1389 Encounter for screening for other disorder: Secondary | ICD-10-CM | POA: Diagnosis not present

## 2014-04-24 DIAGNOSIS — Z1212 Encounter for screening for malignant neoplasm of rectum: Secondary | ICD-10-CM | POA: Diagnosis not present

## 2014-04-24 DIAGNOSIS — Z23 Encounter for immunization: Secondary | ICD-10-CM | POA: Diagnosis not present

## 2014-04-24 LAB — IFOBT (OCCULT BLOOD): IFOBT: NEGATIVE

## 2014-05-05 DIAGNOSIS — N401 Enlarged prostate with lower urinary tract symptoms: Secondary | ICD-10-CM | POA: Diagnosis not present

## 2014-05-05 DIAGNOSIS — N390 Urinary tract infection, site not specified: Secondary | ICD-10-CM | POA: Diagnosis not present

## 2014-05-05 DIAGNOSIS — R3915 Urgency of urination: Secondary | ICD-10-CM | POA: Diagnosis not present

## 2014-05-15 ENCOUNTER — Ambulatory Visit (INDEPENDENT_AMBULATORY_CARE_PROVIDER_SITE_OTHER): Payer: Medicare Other | Admitting: Pharmacist

## 2014-05-15 DIAGNOSIS — I4891 Unspecified atrial fibrillation: Secondary | ICD-10-CM

## 2014-05-15 LAB — POCT INR: INR: 2.7

## 2014-06-27 ENCOUNTER — Encounter: Payer: Self-pay | Admitting: Neurology

## 2014-06-28 ENCOUNTER — Ambulatory Visit: Payer: Medicare Other | Admitting: Neurology

## 2014-07-04 ENCOUNTER — Encounter: Payer: Self-pay | Admitting: Neurology

## 2014-07-04 ENCOUNTER — Ambulatory Visit (INDEPENDENT_AMBULATORY_CARE_PROVIDER_SITE_OTHER): Payer: Medicare Other | Admitting: Neurology

## 2014-07-04 VITALS — BP 130/69 | HR 84 | Temp 98.1°F | Ht 79.0 in | Wt 281.0 lb

## 2014-07-04 DIAGNOSIS — G609 Hereditary and idiopathic neuropathy, unspecified: Secondary | ICD-10-CM

## 2014-07-04 DIAGNOSIS — E538 Deficiency of other specified B group vitamins: Secondary | ICD-10-CM

## 2014-07-04 NOTE — Progress Notes (Signed)
[facility]    Provider:  Dr Jaynee Eagles Referring Provider: Precious Reel, MD Primary Care Physician:  Precious Reel, MD  CC:  Neuropathy  HPI:  Peter Singh is a 80 y.o. male here as a referral from Dr. Virgina Jock for Neuropathy  Neuropathy started 4-5 years ago. No inciting factors, no medications that are know to cause neuropathy, no chemotherapy. He went to [facility] and they found a growth in his spinal column. He doesn't have any feeling in the tips of his fingers and in his feet, just numb. No tingling or burning. He is having fine motor skill difficulties. Dropping things. In all the fingers possibly in the whole hands. No cramping at night. Is the same all the time, continuous all day. It doesn't vary too much, not painful just numb. The changes go up to the ankles. Progressive. No burning just numbness. Balance is poor. He has to be careful to stop from falling. Stumbling. Has 2 drinks a day of bourbon.  No chemotherapy, no long-term usage of medications that can cause neuropathy. 2 drinks of bourbon a day. No other focal neurologic symptoms. No FHx of neuropathy.   Reviewed notes, labs and imaging from outside physicians, which showed:   Ct showed No acute intracranial abnormalities including mass lesion or mass effect, hydrocephalus, extra-axial fluid collection, midline shift, hemorrhage, or acute infarction, large ischemic events (personally reviewed images), did show chronic mild global atrophy as well as chronic ischemic white matter non-specific changes. Last INR was 2.7.     Review of Systems: Patient complains of symptoms per HPI as well as the following symptoms Easy bleeding, confusion, numbness, SOB, decreased energy, skin sensitivity, rosacea. Pertinent negatives per HPI. All others negative.   History   Social History  . Marital Status: Married    Spouse Name: N/A    Number of Children: 2  . Years of Education: N/A   Occupational History  . retired      Social History Main Topics  . Smoking status: Former Smoker -- 3.00 packs/day    Types: Cigarettes    Quit date: 06/09/1975  . Smokeless tobacco: Never Used  . Alcohol Use: 0.0 oz/week    0 Not specified per week     Comment: 2 drinks per day  . Drug Use: No  . Sexual Activity: Not on file   Other Topics Concern  . Not on file   Social History Narrative   Patient is married with 2 children    Patient is retired    Patient lives at [place] History  Problem Relation Age of Onset  . Stroke Father   . Neuropathy Neg Hx     Past Medical History  Diagnosis Date  . Diverticulosis of colon (without mention of hemorrhage)   . Personal history of colonic polyps 1999 & 2004    adenomatous polyps  . Hyperlipemia   . Melanoma   . Atrial fibrillation   . Ventricular hypertrophy   . Hypertension     Past Surgical History  Procedure Laterality Date  . Tonsillectomy and adenoidectomy    . Knee surgery      right  . Pilonidal cyst drainage    . Spine surgery      tumor removed  . Schwanoma tumor lumbar spine      Current Outpatient Prescriptions  Medication Sig Dispense Refill  . atorvastatin (LIPITOR) 20 MG tablet Take 20 mg by mouth daily.      Marland Kitchen  hydrochlorothiazide (HYDRODIURIL) 25 MG tablet Take 25 mg by mouth daily.      . Hydrocodone-Acetaminophen 5-300 MG TABS Take by mouth every 6 (six) hours as needed.    . sildenafil (VIAGRA) 100 MG tablet Take 100 mg by mouth daily as needed for erectile dysfunction.    . Tamsulosin HCl (FLOMAX) 0.4 MG CAPS Take 0.4 mg by mouth 2 (two) times daily.      Marland Kitchen warfarin (COUMADIN) 5 MG tablet Take as directed by the coumadin clinic 120 tablet 1   No current facility-administered medications for this visit.    Allergies as of 07/04/2014 - Review Complete 07/04/2014  Allergen Reaction Noted  . Penicillins Rash 04/08/2011    Vitals: BP 130/69 mmHg  Pulse 84  Temp(Src) 98.1 F (36.7 C) (Oral)  Ht 6\' 7"  (2.007 m)   Wt 281 lb (127.461 kg)  BMI 31.64 kg/m2 Last Weight:  Wt Readings from Last 1 Encounters:  07/04/14 281 lb (127.461 kg)   Last Height:   Ht Readings from Last 1 Encounters:  07/04/14 6\' 7"  (2.007 m)   Physical exam: Exam: Gen: NAD, conversant, well nourised, obese, well groomed                     CV: RRR, no MRG. No Carotid Bruits. No peripheral edema, warm, nontender Eyes: Conjunctivae clear without exudates or hemorrhage  Neuro: Detailed Neurologic Exam  Speech:    Speech is normal; fluent and spontaneous with normal comprehension.  Cognition:    The patient is oriented to person, place, and time;     recent and remote memory intact;     language fluent;     normal attention, concentration,     fund of knowledge Cranial Nerves:    The pupils are equal, round, and reactive to light. The fundi are normal and spontaneous venous pulsations are present. Visual fields are full to finger confrontation. Extraocular movements are intact. Trigeminal sensation is intact and the muscles of mastication are normal. The face is symmetric. The palate elevates in the midline. Hearing intact. Voice is normal. Shoulder shrug is normal. The tongue has normal motion without fasciculations.   Coordination:    Normal finger to nose and heel to shin. Normal rapid alternating movements.   Gait:    Heel-toe and tandem gait are normal.   Motor Observation:    No asymmetry, no atrophy, and no involuntary movements noted. Tone:    Normal muscle tone.    Posture:    Posture is normal. normal erect    Strength:    Strength is V/V in the upper and lower limbs.      Sensation:   can't feel vibration in the toes, malleoli or knees. Can feel vibration in the fingers DIP.  Pp and temp decreased to elbows and above knees Proprioception absent in the great toes.  Reflex Exam:  DTR's:      Absent achilles  Toes:    The toes are downgoing bilaterally.   Clonus:    Clonus is  absent.   Assessment/Plan:  80 year old male PMHx of afib on coumadin, HTN, HLD who is here for progressive numbness in the toes and fingers. Exam with decrease to all modalities in glove and stocking distribution. Will order a neuropathy serum workup. Will also order an EMG/NCS. Reports 2 drinks of bourbon a night, discussed that 14 drinks a week is the recommended  limit for men and alcohol can be directly toxic to  nerves.   Sarina Ill, MD  [facility] [address] [place], [place] [address]  Phone [phone] Fax [phone]

## 2014-07-04 NOTE — Patient Instructions (Signed)
Overall you are doing fairly well but I do want to suggest a few things today:   Remember to drink plenty of fluid, eat healthy meals and do not skip any meals. Try to eat protein with a every meal and eat a healthy snack such as fruit or nuts in between meals. Try to keep a regular sleep-wake schedule and try to exercise daily, particularly in the form of walking, 20-30 minutes a day, if you can.   As far as your medications are concerned, I would like to suggest  As far as diagnostic testing: labs, emg/ncs labs today  I would like to see you back for emg/ncs  Within the next 2-3  weeks, sooner if we need to. Please call us with any interim questions, concerns, problems, updates or refill requests.   Please also call us for any test results so we can go over those with you on the phone.  My clinical assistant and will answer any of your questions and relay your messages to me and also relay most of my messages to you.   Our phone number is [phone]. We also have an after hours call service for urgent matters and there is a physician on-call for urgent questions. For any emergencies you know to call 911 or go to the nearest emergency room

## 2014-07-06 ENCOUNTER — Encounter: Payer: Self-pay | Admitting: Neurology

## 2014-07-06 DIAGNOSIS — G609 Hereditary and idiopathic neuropathy, unspecified: Secondary | ICD-10-CM | POA: Insufficient documentation

## 2014-07-12 LAB — IFE AND PE, SERUM
ALBUMIN/GLOB SERPL: 1.8 (ref 0.7–2.0)
Albumin SerPl Elph-Mcnc: 3.9 g/dL (ref 3.2–5.6)
Alpha 1: 0.2 g/dL (ref 0.1–0.4)
Alpha2 Glob SerPl Elph-Mcnc: 0.5 g/dL (ref 0.4–1.2)
B-Globulin SerPl Elph-Mcnc: 0.8 g/dL (ref 0.6–1.3)
GLOBULIN, TOTAL: 2.2 g/dL (ref 2.0–4.5)
Gamma Glob SerPl Elph-Mcnc: 0.7 g/dL (ref 0.5–1.6)
IGG (IMMUNOGLOBIN G), SERUM: 678 mg/dL — AB (ref 700–1600)
IGM (IMMUNOGLOBULIN M), SRM: 74 mg/dL (ref 40–230)
IgA/Immunoglobulin A, Serum: 150 mg/dL (ref 91–414)
Total Protein: 6.1 g/dL (ref 6.0–8.5)

## 2014-07-12 LAB — PAN-ANCA
ANCA Proteinase 3: <3.5 U/mL (ref 0.0–3.5)
Myeloperoxidase Ab: <9 U/mL (ref 0.0–9.0)
P-ANCA: <1:20 {titer}

## 2014-07-12 LAB — HIV ANTIBODY (ROUTINE TESTING W REFLEX)
HIV 1/O/2 Abs-Index Value: <1 (ref <1.00)
HIV-1/HIV-2 Ab: NONREACTIVE

## 2014-07-12 LAB — B12 AND FOLATE PANEL: VITAMIN B 12: 375 pg/mL (ref 211–946)

## 2014-07-12 LAB — THYROID PANEL WITH TSH
FREE THYROXINE INDEX: 2.1 (ref 1.2–4.9)
T3 Uptake Ratio: 32 % (ref 24–39)
T4 TOTAL: 6.7 ug/dL (ref 4.5–12.0)
TSH: 1.08 u[IU]/mL (ref 0.450–4.500)

## 2014-07-12 LAB — PARANEOPLASTIC PROFILE 1: Neuronal Nuc Ab (Ri), IFA: <1:10 {titer}

## 2014-07-12 LAB — ANGIOTENSIN CONVERTING ENZYME: ANGIO CONVERT ENZYME: 32 U/L (ref 14–82)

## 2014-07-12 LAB — ANA W/REFLEX: ANA: NEGATIVE

## 2014-07-12 LAB — SEDIMENTATION RATE: Sed Rate: 6 mm/hr (ref 0–30)

## 2014-07-12 LAB — HEPATITIS C ANTIBODY: Hep C Virus Ab: <0.1 s/co ratio (ref 0.0–0.9)

## 2014-07-12 LAB — VITAMIN B1, WHOLE BLOOD: THIAMINE: 150.5 nmol/L (ref 66.5–200.0)

## 2014-07-12 LAB — HIGH SENSITIVITY CRP: CRP HIGH SENSITIVITY: 0.75 mg/L (ref 0.00–3.00)

## 2014-07-12 LAB — RHEUMATOID FACTOR: Rhuematoid fact SerPl-aCnc: 7.7 IU/mL (ref 0.0–13.9)

## 2014-07-12 LAB — ETHANOL: Ethanol: NEGATIVE %

## 2014-07-12 LAB — RPR: RPR: NONREACTIVE

## 2014-07-12 LAB — METHYLMALONIC ACID, SERUM: Methylmalonic Acid: 123 nmol/L (ref 0–378)

## 2014-07-19 ENCOUNTER — Ambulatory Visit (INDEPENDENT_AMBULATORY_CARE_PROVIDER_SITE_OTHER): Payer: Medicare Other | Admitting: Neurology

## 2014-07-19 ENCOUNTER — Ambulatory Visit (INDEPENDENT_AMBULATORY_CARE_PROVIDER_SITE_OTHER): Payer: Self-pay | Admitting: Neurology

## 2014-07-19 ENCOUNTER — Telehealth: Payer: Self-pay | Admitting: *Deleted

## 2014-07-19 DIAGNOSIS — G609 Hereditary and idiopathic neuropathy, unspecified: Secondary | ICD-10-CM | POA: Diagnosis not present

## 2014-07-19 DIAGNOSIS — Z0289 Encounter for other administrative examinations: Secondary | ICD-10-CM

## 2014-07-19 DIAGNOSIS — R7302 Impaired glucose tolerance (oral): Secondary | ICD-10-CM | POA: Diagnosis not present

## 2014-07-19 NOTE — Progress Notes (Signed)
See procedure note.

## 2014-07-20 LAB — HEMOGLOBIN A1C
ESTIMATED AVERAGE GLUCOSE: 126 mg/dL
HEMOGLOBIN A1C: 6 % — AB (ref 4.8–5.6)

## 2014-07-23 NOTE — Progress Notes (Signed)
  [facility]    Provider:  Dr Jaynee Eagles Referring Provider: Precious Reel, MD Primary Care Physician:  Precious Reel, MD  HPI:  Peter Singh is a 81 y.o. male here as a referral from Dr. Virgina Jock for Neuropathy. PMHx of afib on coumadin, HTN, HLD who is here for progressive numbness in the toes and fingers. Symptoms started 4-5 years ago. No inciting factors, no medications that are know to cause neuropathy, no chemotherapy. He went to [facility] and they found a growth in his spinal column. He doesn't have any feeling in the tips of his fingers and in his feet, just numb. No tingling or burning. He is having fine motor skill difficulties. Dropping things. In all the fingers possibly in the whole hands. No cramping at night. Is the same all the time, continuous all day. It doesn't vary too much, not painful just numb. The changes go up to the ankles. Progressive. No burning just numbness. Balance is poor. He has to be careful to stop from falling. Stumbling a lot. No chemotherapy, no long-term usage of medications that can cause neuropathy. 2 drinks of bourbon a day. No other focal neurologic symptoms. No FHx of neuropathy. Exam with decrease to all modalities in glove and stocking distribution and absent achilles DTRs.  Summary: Nerve Conduction studies were performed on the bilateral upper and lower extremities.   The right ulnar motor study showed prolonged distal onset latency of 4.9 ms (N<4.2 ms) and reduced amplitude of 2.49mV (N>5.0 mV).   The left ulnar motor study showed prolonged distal onset latency of 4.4 ms (N<4.2 ms) and reduced amplitude of 2.50mV (N>5.0 mV).  Bilateral median and Ulnar F waves were slightly delayed however this may be within normal limits given long length of limbs.  Bilateral 5th-digit Ulnar sensory conductions showed no response  Bilateral Median APB motor conductions were within normal limits. Bilateral 2nd-digit Median and bilateral Radial sensory  conductions were within normal limits.   Bilateral Peroneal Motor studies showed no response Bilateral Tibial Motor studies showed no response Bilateral Superficial Peroneal and Sural sensory studies showed no response Bilateral H-Reflex studies showed no response  Needle evaluation of the right anterior tibialis muscle showed increased motor unit amplitude, increased motor unit duration, and diminished recruitment.  The right medial gastrocnemius muscle showed moderately increased spontaneous activity, increased motor unit amplitude, increased motor unit duration, and diminished recruitment.  The right extensor hallucis longus muscle showed reduced insertional activity, slightly increased spontaneous activity, increased motor unit amplitude, increased motor unit duration, and diminished recruitment.  Attempted but unable to perform the EMG needle study on the right abductor hallucis muscle due to significant atrophy.  The right Vastus Medialis muscle was within normal limits.  Conclusion: There is electrophysiologic evidence of a length-dependent severe axonal sensorimotor polyneuropathy.  In addition there is marked reduction in insertional activity from distal foot muscles suggestive of muscle atrophy. There is also concomitant non-localizing ulnar neuropathy.   Clinical correlation recommended.   Sarina Ill, MD  [facility] [address] [place], [place] [address]  Phone [phone] Fax [phone]

## 2014-07-23 NOTE — Telephone Encounter (Signed)
Called patient back. He is coming in on Wednesday and we can talk at that time.

## 2014-07-24 DIAGNOSIS — R339 Retention of urine, unspecified: Secondary | ICD-10-CM | POA: Diagnosis not present

## 2014-07-24 DIAGNOSIS — N401 Enlarged prostate with lower urinary tract symptoms: Secondary | ICD-10-CM | POA: Diagnosis not present

## 2014-07-24 DIAGNOSIS — N390 Urinary tract infection, site not specified: Secondary | ICD-10-CM | POA: Diagnosis not present

## 2014-07-26 ENCOUNTER — Encounter: Payer: Self-pay | Admitting: Neurology

## 2014-07-26 ENCOUNTER — Ambulatory Visit (INDEPENDENT_AMBULATORY_CARE_PROVIDER_SITE_OTHER): Payer: Medicare Other | Admitting: Neurology

## 2014-07-26 VITALS — BP 121/63 | HR 60 | Ht 79.0 in | Wt 279.0 lb

## 2014-07-26 DIAGNOSIS — G609 Hereditary and idiopathic neuropathy, unspecified: Secondary | ICD-10-CM | POA: Diagnosis not present

## 2014-07-26 DIAGNOSIS — R4189 Other symptoms and signs involving cognitive functions and awareness: Secondary | ICD-10-CM | POA: Diagnosis not present

## 2014-07-26 DIAGNOSIS — G6289 Other specified polyneuropathies: Secondary | ICD-10-CM | POA: Diagnosis not present

## 2014-07-26 NOTE — Patient Instructions (Signed)
Overall you are doing fairly well but I do want to suggest a few things today:   Remember to drink plenty of fluid, eat healthy meals and do not skip any meals. Try to eat protein with a every meal and eat a healthy snack such as fruit or nuts in between meals. Try to keep a regular sleep-wake schedule and try to exercise daily, particularly in the form of walking, 20-30 minutes a day, if you can.   As far as your medications are concerned, I would like to suggest  As far as diagnostic testing: MRI of the brain, Neuropsychological testing with Dr. Ernesta Amble, PHD  Psychologist  [id number]  [phone]   I would like to see you back after testing is complete, sooner if we need to. Please call us with any interim questions, concerns, problems, updates or refill requests.   My clinical assistant and will answer any of your questions and relay your messages to me and also relay most of my messages to you.   Our phone number is [phone]. We also have an after hours call service for urgent matters and there is a physician on-call for urgent questions. For any emergencies you know to call 911 or go to the nearest emergency room

## 2014-07-26 NOTE — Progress Notes (Signed)
Peter Singh    Provider:  Dr Jaynee Eagles Referring Provider: Precious Reel, MD Primary Care Physician:  Precious Reel, MD  CC:  Cognitive changes  HPI:  Peter Singh is a 81 y.o. male here as a referral from Dr. Virgina Jock for memory loss. Having general loss of memory and forgetting names, asking the same thing over and over, forgetting appointments and names. Changes started several years ahgo, unsure when and are progressive. Wife accompanies and provides history. 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 It was benign. He is excessively tired tired during the day, he naps in the afternoons, some snoring at night. No witnessed apneic events. In his feet, he gets sharp pains in the feet. A few times he has gotten lost in the car. Was previously evaluated in my clinic for neuropathy, this is a new complaint. Patient has 2 drinks of bourbon a night, wife confirms. His recall of remote events appears intact, can recite the address of where he lived as achild. Appears to be more recent memories. He is still living independently with his wife and performs all his ADLs and IADLs. No other focal neurologic deficits. He has neuropathy. No FHx of neurodegenerative disease. He continues to have worsening numbness in the distal extremities.   Reviewed notes, labs and imaging from outside physicians, which showed: hgba1c 6.0, TSH wnl, B12 WNL, folate WNL, Other normal labs include: IFE, Pan-ANCA, ace, hiv, esr, ana, rpr, hepc ab, crp, paraneoplastic ab panel, RF, B1, ethanol (neg), mma. No etiology for neuropathy found except glucose intolerance and possibly alcohol use. EMG/NCS showed severe length-dependent axonal polyneuropathy.   Previous visit 07/04/2014 HPI: Peter Singh is a 81 y.o. male here as a referral from Dr. Virgina Jock for Neuropathy  Neuropathy started 4-5 years ago. No inciting factors, no medications that are know to cause neuropathy, no chemotherapy. He went to [facility] and they  found a growth in his spinal column. He doesn't have any feeling in the tips of his fingers and in his feet, just numb. No tingling or burning. He is having fine motor skill difficulties. Dropping things. In all the fingers possibly in the whole hands. No cramping at night. Is the same all the time, continuous all day. It doesn't vary too much, not painful just numb. The changes go up to the ankles. Progressive. No burning just numbness. Balance is poor. He has to be careful to stop from falling. Stumbling. Has 2 drinks a day of bourbon. No chemotherapy, no long-term usage of medications that can cause neuropathy. 2 drinks of bourbon a day. No other focal neurologic symptoms. No FHx of neuropathy.   Reviewed notes, labs and imaging from outside physicians, which showed:   Ct showed No acute intracranial abnormalities including mass lesion or mass effect, hydrocephalus, extra-axial fluid collection, midline shift, hemorrhage, or acute infarction, large ischemic events (personally reviewed images), did show chronic mild global atrophy as well as chronic ischemic white matter non-specific changes. Last INR was 2.7.  Review of Systems: Patient complains of symptoms per HPI as well as the following symptoms: numbness. Denies CP or SOB Pertinent negatives per HPI. All others negative.   History   Social History  . Marital Status: Married    Spouse Name: N/A    Number of Children: 2  . Years of Education: N/A   Occupational History  . retired    Social History Main Topics  . Smoking status: Former Smoker -- 3.00 packs/day  Types: Cigarettes    Quit date: 06/09/1975  . Smokeless tobacco: Never Used  . Alcohol Use: 0.0 oz/week    0 Not specified per week     Comment: 2 drinks per day  . Drug Use: No  . Sexual Activity: Not on file   Other Topics Concern  . Not on file   Social History Narrative   Patient is married with 2 children    Patient is retired    Patient lives at [place] History  Problem Relation Age of Onset  . Stroke Father   . Neuropathy Neg Hx     Past Medical History  Diagnosis Date  . Diverticulosis of colon (without mention of hemorrhage)   . Personal history of colonic polyps 1999 & 2004    adenomatous polyps  . Hyperlipemia   . Melanoma   . Atrial fibrillation   . Ventricular hypertrophy   . Hypertension     Past Surgical History  Procedure Laterality Date  . Tonsillectomy and adenoidectomy    . Knee surgery      right  . Pilonidal cyst drainage    . Spine surgery      tumor removed  . Schwanoma tumor lumbar spine      Current Outpatient Prescriptions  Medication Sig Dispense Refill  . atorvastatin (LIPITOR) 20 MG tablet Take 20 mg by mouth daily.      . hydrochlorothiazide (HYDRODIURIL) 25 MG tablet Take 25 mg by mouth daily.      . Hydrocodone-Acetaminophen 5-300 MG TABS Take by mouth every 6 (six) hours as needed.    . sildenafil (VIAGRA) 100 MG tablet Take 100 mg by mouth daily as needed for erectile dysfunction.    . sulfamethoxazole-trimethoprim (BACTRIM DS,SEPTRA DS) 800-160 MG per tablet     . Tamsulosin HCl (FLOMAX) 0.4 MG CAPS Take 0.4 mg by mouth 2 (two) times daily.      Marland Kitchen warfarin (COUMADIN) 5 MG tablet Take as directed by the coumadin clinic 120 tablet 1   No current facility-administered medications for this visit.    Allergies as of 07/26/2014 - Review Complete 07/26/2014  Allergen Reaction Noted  . Penicillins Rash 04/08/2011    Vitals: BP 121/63 mmHg  Pulse 60  Ht '6\' 7"'  (2.007 m)  Wt 279 lb (126.554 kg)  BMI 31.42 kg/m2 Last Weight:  Wt Readings from Last 1 Encounters:  07/26/14 279 lb (126.554 kg)   Last Height:   Ht Readings from Last 1 Encounters:  07/26/14 '6\' 7"'  (2.007 m)        Physical exam: Exam: Gen: NAD, conversant, well nourised, obese, well groomed  CV: RRR, no MRG. No Carotid Bruits. Eyes: Conjunctivae clear without exudates or  hemorrhage  Neuro: No changes Detailed Neurologic Exam  Speech:  Speech is normal; fluent and spontaneous with normal comprehension.  Cognition: MoCA 22/30 ( -2 visuospatial/executive, -5 delayed recall, -1 orientation)  The patient is oriented to person, place, month/year;   recent memory impaired, remote memory intact;   language fluent;   normal attention, concentration, fund of knowledge Cranial Nerves:  The pupils are equal, round, and reactive to light. The fundi are without edema. Extraocular movements are intact. Trigeminal sensation is intact and the muscles of mastication are normal. The face is symmetric. The palate elevates in the midline. Hearing intact. Voice is normal. Shoulder shrug is normal. The tongue has normal motion without fasciculations.   Coordination:  Normal finger to nose  and heel to shin. Normal rapid alternating movements.   Gait:  Heel-toe and tandem gait are normal.   Motor Observation:  No asymmetry, no atrophy, and no involuntary movements noted. Tone:  Normal muscle tone.   Posture:  Posture is normal. normal erect   Strength:  Strength is V/V in the upper and lower limbs.    Sensation:  can't feel vibration in the toes, malleoli or knees. Can feel vibration in the fingers DIP.  Pp and temp decreased to elbows and above knees Proprioception absent in the great toes.  Reflex Exam:  DTR's:  Absent achilles  Toes:  The toes are downgoing bilaterally.  Clonus:  Clonus is absent.   Assessment/Plan: 81 year old male PMHx of afib on coumadin, HTN, HLD who is here for cognitive difficulties. Was evaluated previously for progressive numbness in the toes and fingers. Exam with decrease to all modalities in glove and stocking distribution. MoCA exam 22/30 with loss of most points for delayed recall.   Neuropathy: No etiology for neuropathy found except glucose intolerance and possibly alcohol  use. EMG/NCS showed severe length-dependent axonal polyneuropathy. Reports 2 drinks of bourbon a night, discussed that 14 drinks a week is the recommended limit for men and alcohol can be directly toxic to nerves. Will order specialty test anti-mag (myelin-associated glycoprotein) antibody.  Cognitive changes: Likely mild cognitive changes associated with age. Will order an MRi of the brain and also refer for Neuropsychiatric testing.    Sarina Ill, MD  [facility] [address] [place], [place] [address]  Phone [phone] Fax [phone]  A total of 30 minutes was spent in with this patient. Over half this time was spent on counseling patient on dementia vs cognitive changes of normal ages diagnosis and different diagnostic and therapeutic options available.

## 2014-07-27 DIAGNOSIS — I480 Paroxysmal atrial fibrillation: Secondary | ICD-10-CM | POA: Diagnosis not present

## 2014-07-27 DIAGNOSIS — Z6833 Body mass index (BMI) 33.0-33.9, adult: Secondary | ICD-10-CM | POA: Diagnosis not present

## 2014-07-27 DIAGNOSIS — J209 Acute bronchitis, unspecified: Secondary | ICD-10-CM | POA: Diagnosis not present

## 2014-07-27 DIAGNOSIS — Z7901 Long term (current) use of anticoagulants: Secondary | ICD-10-CM | POA: Diagnosis not present

## 2014-07-27 DIAGNOSIS — R0609 Other forms of dyspnea: Secondary | ICD-10-CM | POA: Diagnosis not present

## 2014-07-27 DIAGNOSIS — N390 Urinary tract infection, site not specified: Secondary | ICD-10-CM | POA: Diagnosis not present

## 2014-07-27 LAB — SPECIMEN STATUS REPORT

## 2014-07-30 DIAGNOSIS — R4189 Other symptoms and signs involving cognitive functions and awareness: Secondary | ICD-10-CM | POA: Insufficient documentation

## 2014-07-31 ENCOUNTER — Telehealth: Payer: Self-pay | Admitting: Neurology

## 2014-07-31 ENCOUNTER — Ambulatory Visit (INDEPENDENT_AMBULATORY_CARE_PROVIDER_SITE_OTHER): Payer: Medicare Other

## 2014-07-31 DIAGNOSIS — I4891 Unspecified atrial fibrillation: Secondary | ICD-10-CM | POA: Diagnosis not present

## 2014-07-31 LAB — POCT INR: INR: 1.8

## 2014-07-31 MED ORDER — DONEPEZIL HCL 5 MG PO TABS: 5.0000 mg | ORAL_TABLET | Freq: Every day | ORAL | Status: DC

## 2014-07-31 NOTE — Telephone Encounter (Signed)
Rx has been sent.  I called back.  Spoke with Ms Mirarchi.  Relayed Dr Cathren Laine note.  She verbalized understanding and will contact us if needed.

## 2014-07-31 NOTE — Telephone Encounter (Signed)
Patient requesting Rx Aricept forwarded to [facility].  Please call and advise.

## 2014-07-31 NOTE — Telephone Encounter (Signed)
I called back.  Spoke with Peter Singh.  She said they would like to get a Rx for Aricept.  I will be happy to send Rx, just want to verify it is okay to add to med list, and verify dose.  Should patient start at 5mg  for one month then increase to 10mg  if tolerated, or just stay at 5mg ?  Please advise.  Thank you.

## 2014-07-31 NOTE — Telephone Encounter (Signed)
Yes, please go ahead. He can start at 5mg  and then increase to 10mg  in a month if he tolerates it well. Thank you!

## 2014-08-01 DIAGNOSIS — R3915 Urgency of urination: Secondary | ICD-10-CM | POA: Diagnosis not present

## 2014-08-01 LAB — GM1 ANTIBODY IGG, IGM: GM 1 IgM: <1:100 {titer}

## 2014-08-01 LAB — ANTI-MYELIN ASSOC. GLY. IGM

## 2014-08-01 NOTE — Procedures (Signed)
Provider: Dr Jaynee Eagles  Referring Provider: Precious Reel, MD  Primary Care Physician: Precious Reel, MD   HPI: Peter Singh is a 81 y.o. male here as a referral from Dr. Virgina Jock for Neuropathy. PMHx of afib on coumadin, HTN, HLD who is here for progressive numbness in the toes and fingers.  Symptoms started 4-5 years ago. No inciting factors, no medications that are know to cause neuropathy, no chemotherapy. He went to [facility] and they found a growth in his spinal column. He doesn't have any feeling in the tips of his fingers and in his feet, just numb. No tingling or burning. He is having fine motor skill difficulties. Dropping things. In all the fingers possibly in the whole hands. No cramping at night. Is the same all the time, continuous all day. It doesn't vary too much, not painful just numb. The changes go up to the ankles. Progressive. No burning just numbness. Balance is poor. He has to be careful to stop from falling. Stumbling a lot. No chemotherapy, no long-term usage of medications that can cause neuropathy. 2 drinks of bourbon a day. No other focal neurologic symptoms. No FHx of neuropathy. Exam with decrease to all modalities in glove and stocking distribution and absent achilles DTRs.   Summary: Nerve Conduction studies were performed on the bilateral upper and lower extremities.  The right ulnar motor study showed prolonged distal onset latency of 4.9 ms (N<4.2 ms) and reduced amplitude of 2.21mV (N>5.0 mV).  The left ulnar motor study showed prolonged distal onset latency of 4.4 ms (N<4.2 ms) and reduced amplitude of 2.50mV (N>5.0 mV).  Bilateral median and Ulnar F waves were slightly delayed however this may be within normal limits given long length of limbs.  Bilateral 5th-digit Ulnar sensory conductions showed no response  Bilateral Median APB motor conductions were within normal limits.  Bilateral 2nd-digit Median and bilateral Radial sensory conductions were within normal limits.   Bilateral Peroneal Motor studies showed no response  Bilateral Tibial Motor studies showed no response  Bilateral Superficial Peroneal and Sural sensory studies showed no response  Bilateral H-Reflex studies showed no response  Needle evaluation of the right anterior tibialis muscle showed increased motor unit amplitude, increased motor unit duration, and diminished recruitment. The right medial gastrocnemius muscle showed moderately increased spontaneous activity, increased motor unit amplitude, increased motor unit duration, and diminished recruitment. The right extensor hallucis longus muscle showed reduced insertional activity, slightly increased spontaneous activity, increased motor unit amplitude, increased motor unit duration, and diminished recruitment. Attempted but unable to perform the EMG needle study on the right abductor hallucis muscle due to significant atrophy. The right Vastus Medialis muscle was within normal limits.   Conclusion: There is electrophysiologic evidence of a length-dependent severe axonal sensorimotor polyneuropathy. In addition there is marked reduction in insertional activity from distal foot muscles suggestive of muscle atrophy. There is also concomitant non-localizing ulnar neuropathy.  Clinical correlation recommended.   Sarina Ill, MD  [facility]  [address] [place]  [place], [place] [address]  Phone [phone] Fax [phone]

## 2014-08-03 DIAGNOSIS — D225 Melanocytic nevi of trunk: Secondary | ICD-10-CM | POA: Diagnosis not present

## 2014-08-03 DIAGNOSIS — Z85820 Personal history of malignant melanoma of skin: Secondary | ICD-10-CM | POA: Diagnosis not present

## 2014-08-03 DIAGNOSIS — Z85828 Personal history of other malignant neoplasm of skin: Secondary | ICD-10-CM | POA: Diagnosis not present

## 2014-08-03 DIAGNOSIS — L821 Other seborrheic keratosis: Secondary | ICD-10-CM | POA: Diagnosis not present

## 2014-08-03 DIAGNOSIS — L570 Actinic keratosis: Secondary | ICD-10-CM | POA: Diagnosis not present

## 2014-08-03 DIAGNOSIS — D1801 Hemangioma of skin and subcutaneous tissue: Secondary | ICD-10-CM | POA: Diagnosis not present

## 2014-08-03 LAB — SPECIMEN STATUS REPORT

## 2014-08-03 LAB — ANTI-MYELIN ASSOC. GLY. IGM

## 2014-08-08 NOTE — Progress Notes (Signed)
Quick Note:  Called and left message for patient normal labs. ______

## 2014-08-10 DIAGNOSIS — J209 Acute bronchitis, unspecified: Secondary | ICD-10-CM | POA: Diagnosis not present

## 2014-08-12 ENCOUNTER — Ambulatory Visit
Admission: RE | Admit: 2014-08-12 | Discharge: 2014-08-12 | Disposition: A | Discharge status: Home / Self Care | Payer: Medicare Other | Source: Ambulatory Visit | Attending: Neurology | Admitting: Neurology

## 2014-08-12 DIAGNOSIS — R4189 Other symptoms and signs involving cognitive functions and awareness: Secondary | ICD-10-CM

## 2014-08-25 ENCOUNTER — Other Ambulatory Visit: Payer: Self-pay | Admitting: Neurology

## 2014-08-25 DIAGNOSIS — I639 Cerebral infarction, unspecified: Secondary | ICD-10-CM

## 2014-08-27 ENCOUNTER — Telehealth: Payer: Self-pay | Admitting: Neurology

## 2014-08-27 NOTE — Telephone Encounter (Signed)
Spoke to patient and his wife. Relayed MRI of the brain. Some non-specific white matter abnormalities present, should order an echo of his heart just to ensure no cardioembolic disease.

## 2014-08-28 ENCOUNTER — Ambulatory Visit (INDEPENDENT_AMBULATORY_CARE_PROVIDER_SITE_OTHER): Payer: Medicare Other | Admitting: *Deleted

## 2014-08-28 ENCOUNTER — Telehealth: Payer: Self-pay | Admitting: Neurology

## 2014-08-28 DIAGNOSIS — I4891 Unspecified atrial fibrillation: Secondary | ICD-10-CM

## 2014-08-28 LAB — POCT INR: INR: 1.8

## 2014-08-28 MED ORDER — DONEPEZIL HCL 10 MG PO TABS: 10.0000 mg | ORAL_TABLET | Freq: Every day | ORAL | Status: DC

## 2014-08-28 NOTE — Telephone Encounter (Signed)
Patient's spouse requesting refill for 10 mg due to patient tolerating 5 mg Rx donepezil (ARICEPT) 5 MG tablet.  Please forward refill request to [facility].  Please call and advise.

## 2014-08-28 NOTE — Telephone Encounter (Signed)
Rx has been sent.  I called back.  They are aware.   

## 2014-08-29 ENCOUNTER — Other Ambulatory Visit: Payer: Self-pay | Admitting: Neurology

## 2014-08-29 DIAGNOSIS — I480 Paroxysmal atrial fibrillation: Secondary | ICD-10-CM

## 2014-09-05 DIAGNOSIS — Z9289 Personal history of other medical treatment: Secondary | ICD-10-CM

## 2014-09-05 HISTORY — DX: Personal history of other medical treatment: Z92.89

## 2014-09-06 ENCOUNTER — Ambulatory Visit (HOSPITAL_COMMUNITY): Payer: Medicare Other | Attending: Cardiovascular Disease | Admitting: Radiology

## 2014-09-06 ENCOUNTER — Encounter (HOSPITAL_COMMUNITY): Payer: Self-pay

## 2014-09-06 DIAGNOSIS — I480 Paroxysmal atrial fibrillation: Secondary | ICD-10-CM | POA: Diagnosis not present

## 2014-09-06 DIAGNOSIS — I4891 Unspecified atrial fibrillation: Secondary | ICD-10-CM

## 2014-09-06 DIAGNOSIS — J209 Acute bronchitis, unspecified: Secondary | ICD-10-CM | POA: Diagnosis not present

## 2014-09-06 DIAGNOSIS — R0609 Other forms of dyspnea: Secondary | ICD-10-CM | POA: Diagnosis not present

## 2014-09-06 NOTE — Progress Notes (Signed)
Echocardiogram performed with Bubble Study.  

## 2014-09-06 NOTE — Progress Notes (Signed)
Peter Singh (DOB 04-02-1935) IV 22G (R) AC x 1, tolerated well for a Bubble Study.Irven Baltimore, RN

## 2014-09-11 ENCOUNTER — Telehealth: Payer: Self-pay | Admitting: Neurology

## 2014-09-11 ENCOUNTER — Ambulatory Visit (INDEPENDENT_AMBULATORY_CARE_PROVIDER_SITE_OTHER): Payer: Medicare Other

## 2014-09-11 DIAGNOSIS — I4891 Unspecified atrial fibrillation: Secondary | ICD-10-CM

## 2014-09-11 LAB — POCT INR: INR: 2.7

## 2014-09-11 NOTE — Telephone Encounter (Signed)
Spoke to patient and relayed infomation from 2d echo. Will send him a copy of report. 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 EF 60%.

## 2014-09-12 ENCOUNTER — Encounter: Payer: Self-pay | Admitting: *Deleted

## 2014-09-14 ENCOUNTER — Other Ambulatory Visit: Payer: Self-pay | Admitting: *Deleted

## 2014-09-14 DIAGNOSIS — I4891 Unspecified atrial fibrillation: Secondary | ICD-10-CM

## 2014-09-14 MED ORDER — WARFARIN SODIUM 5 MG PO TABS: ORAL_TABLET | ORAL | Status: DC

## 2014-09-18 ENCOUNTER — Telehealth: Payer: Self-pay | Admitting: Interventional Cardiology

## 2014-09-18 NOTE — Telephone Encounter (Signed)
New message    Patient calling want to discuss 3.2 2016 echo . Has some questions.

## 2014-09-19 NOTE — Telephone Encounter (Signed)
Overall, heart is a little thicker than normal. This has been present on prior studies. No major problems noted.

## 2014-09-19 NOTE — Telephone Encounter (Signed)
Returned pt call. Adv him that a message has been fwd to Dr.Smith to ask him to review pt echo results. Once reviewed by him I will call back with his recommendations. Pt thanked me for the call back and verbalized understanding.

## 2014-09-19 NOTE — Telephone Encounter (Signed)
F/U         Pt is returning call from today.    Please call.

## 2014-09-19 NOTE — Telephone Encounter (Signed)
returned pt call lmtcb. will fwd a message to Dr.Smith to review pt echo. echo was ordered by another physician.

## 2014-09-20 NOTE — Telephone Encounter (Signed)
Pt aware of that Dr.Smith has reviewed his echo results, and they are as followed Overall, heart is a little thicker than normal. This has been present on prior studies. No major problems noted.  Pt thanked me for the follow up and verbalized understanding

## 2014-10-02 ENCOUNTER — Ambulatory Visit (INDEPENDENT_AMBULATORY_CARE_PROVIDER_SITE_OTHER): Payer: Medicare Other | Admitting: Pharmacist

## 2014-10-02 DIAGNOSIS — I4891 Unspecified atrial fibrillation: Secondary | ICD-10-CM

## 2014-10-02 LAB — POCT INR: INR: 2.2

## 2014-10-06 ENCOUNTER — Ambulatory Visit: Payer: Medicare Other | Attending: Psychology | Admitting: Psychology

## 2014-10-06 DIAGNOSIS — R413 Other amnesia: Secondary | ICD-10-CM | POA: Diagnosis not present

## 2014-10-06 NOTE — Progress Notes (Signed)
[facility]  [address]   Telephone [phone] [address] Fax [phone] [place], [place] [address]   Initial Contact Note  Name:  Peter Singh Date of Birth; 08/02/1931 MRN:  [id number] Date:  10/06/2014  [name] is an 81 y.o. male who was referred for neuropsychological evaluation by Sarina Ill, Md of [facility]  due to concerns about short-term memory loss.    A total of 5 hours was spent today reviewing medical records, interviewing (CPT [phone]) Peter Singh and administering and scoring neurocognitive tests (CPT [phone] & [phone]).  Diagnosis: Memory Loss [R41.3]  There were no concerns expressed or behaviors displayed by Peter Singh that would require immediate attention.   A full report will follow once the planned testing has been completed. His next appointment is pending.    Jamey Ripa, Ph.D Licensed Psychologist 10/06/2014

## 2014-10-12 ENCOUNTER — Ambulatory Visit (INDEPENDENT_AMBULATORY_CARE_PROVIDER_SITE_OTHER): Payer: Medicare Other | Admitting: Psychology

## 2014-10-12 DIAGNOSIS — G3184 Mild cognitive impairment, so stated: Secondary | ICD-10-CM

## 2014-10-12 NOTE — Progress Notes (Addendum)
[place] ___________________________________________________________________________ [address]                                                                           Telephone [phone] [address]                                                                                                 Fax [phone] [place], [place] [address]  [place]* This report should not be released without the consent of the client  Name:   Peter Singh. Fly  Date of Birth:  12/18/2031 [facility] MR#:  [id number] Dates of Evaluation: 10/06/14 & 10/12/14  Reason for Referral Peter Singh "Peter Singh" Pingree is an 81 year old man referred for neuropsychological evaluation by Peter Ill, MD of [facility] as prompted by his complaint of worsening memory for recent events that began a few years ago. A MRI of the brain on 08/12/14 showed mild generalized atrophy most noticeable in the mesial temporal lobes, a single micro hemorrhage in the right frontal lobe and wide-spread white matter foci most consistent with age-related chronic microvascular ischemic change. Lab studies were normal.  Sources of Information Electronic medical records from the [facility] were reviewed. Peter Singh, and his wife, Ms. Peter "Clem" Singh, were interviewed.    Chief Complaints He reported that over the past two to three years he has had increasing difficulty with memory. He gave recent examples of forgetting why he entered room or where certain items are located in his kitchen. He recently got lost while driving somewhere familiar though this was a one-time incident. He did not report any problems with long-term memory. His wife reported that for the past three years he has been prone to repeatedly ask her the same question, usually about when or where a planned activity is to occur. She added that he has been "a beat behind" in directing  his attentional focus. She reported that he seemed unable to organize their tax receipts this year.  She has not noticed any changes to his mood, habits or personality.  In addition to memory difficulties, he cited a two year history of trouble walking, reduced balance and numbness in his toes and fingers, all considered related to neuropathy. He denied problems with limb strength, pain, and daytime alertness (he naps most afternoons), vision, hearing, speech, swallowing, appetite, taste or smell. He described himself as satisfied with his life and usually in good spirits. He did not report the occurrence of any new or ongoing life stressors within the past three years.   Both Peter Singh and his wife agreed that he has no problems independently performing his basic and instrumental activities of daily living, including driving and bill paying. He reported that he has continued to be active in several community organizations.    '  Background He lives with his wife of 7 three years. They have two adult children. 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 He stated that he earned a Water quality scientist degree in Press photographer from [employer]. He denied history of school-based attentional or learning problems. 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 He did not report history of head injury, loss of consciousness, stroke-like symptoms, seizure activity or substance abuse. He has been drinking about two drinks of bourbon a day for about thirty years. He quit smoking 1976. He was not aware of any family history of memory loss or dementia. He reported no history of psychiatric disorder, use of psychiatric medications or mental health contacts.  Observations He appeared as an appropriately dressed and  groomed man in no apparent distress. He walked with a small step gait. He did not display any unusual motor mannerisms or motor activity. He interacted in a cooperative and very pleasant manner. His mood seemed cheerful. His affect appeared within a wide range and was congruent with his thoughts. No problems were evident for speech articulation, prosody, word finding, word selection, message coherence or language comprehension. His thought processes were coherent and organized without signs of loose associations or verbal perseverations. His thought content was devoid of unusual or bizarre ideas.  Evaluation Procedures In addition to review of medical records and clinical interviews, the following tests or questionnaires were administered:  Animal Naming Test [facility] Naming Test  Controlled Oral Word Association Test Geriatric Depression Scale (short form) Rey Complex Figure: Copy Trail Making A & B  Wechsler Adult Intelligence Scale-IV:   Music therapist, Coding, Digit Span, Matrix Reasoning & Similarities Wechsler Memory Scale- IV Older Adult Battery Wide Range Achievement Test-4: Word Reading  [employer] (discontinued)   Test Results Validity & Interpretative Considerations Test results were deemed to represent a valid representation of his cognitive functioning. He appeared alert and at ease. He did not bring his reading glasses to the initial testing session though claimed no difficulty seeing test materials. He did bring his reading glasses as instructed to the second testing session.  He did not report problems with hearing or motor control. He seemed to comprehend task instructions without difficulty. There were no signs of careless, impulsive or perseverative responding. He seemed to try his best on testing.   His pre-morbid intellectual potential was estimated to fall within the Average range based on his educational level/vocational background coupled with a measure of  word reading (Wide Range Achievement Test-4) that represents over-learned verbal knowledge that is relatively resistant to the effects of neurological injury/illness.   His test scores were corrected to reflect norms for his age and, whenever possible, his gender and educational level (i.e., 16 years). Please see a listing of test scores at the end of this report.  Attention & Executive Functioning His speed of processing on visual attentional tests was variable. He scored at the upper end of the Average range on a task that required the transcription of symbols to match numbers using a key (Wechsler Adult Intelligence Scale-IV (WAIS-IV) Coding). In contrast, his speed to draw lines to connect numbers randomly arrayed on a page in numerical sequence (Trails A) fell at the lower end of the Low Average range.    His ability to temporarily hold and actively manipulate  information within working memory was within the Average range for auditory input (WAIS-IV Digit Span Backward & Sequencing) though within the Borderline range on a visual test that required immediate recognition of symbols in order (Wechsler Memory Scale-IV (WMS-IV) Symbol Span).  His speed to complete a complex visual sequencing task that required mental tracking and shifting between numbers and letters (Trails B) was within the Mildly Impaired range.   His ability to fluently generate words to letter prompts (Controlled Oral Word Association Test) was within the Mildly Impaired range. His ability to name members of a category (Animal Naming Test) fell within the Low Average range.   His ability to form abstract ideas was within the Average range  on tasks that required the verbal classification of ostensibly different objects or ideas to a shared category (WAIS-IV Similarities) or matching of designs or symbols in an abstract manner (WAIS-IV Matrix Reasoning).   He was unable to perform a novel problem solving task [employer]) that required identifying and systematically applying conceptual rules to sort geometric designs. Despite multiple repetitions of the test instructions, he was unable to figure out what was expected of him.  Learning & Memory On the WMS-IV Older Adult Battery his Immediate Memory Index (IMI), a measure of his ability to recall verbal and visual information immediately after the stimuli was presented, fell within the Average range. His Delayed Memory Index (DMI), a measure of his ability to recall verbal and visual information after a 20 to 30 minute delay, fell within the Borderline range. His delayed memory was specifically reduced by lower than expected delayed recall of figural designs. His ability to recognize these designs from multiple choices was within the Average range, however, which indicated that his difficulty was at the stage of memory retrieval.  Language His naming to confrontation was within the Average range [facility] [employer]). As noted above, measures of verbal fluency were lower than expected.  Word reading skill (Wide Range Achievement Test-4) was average.  Visual Perceptual & Visuospatial Construction There were no signs of spatial inattention or problems with visual recognition. His ability to organize or construct visuospatial material was within normal expectations on tasks that required assembly of two- dimensional block designs from models Product manager) or copy of a spatially-complex geometric design (Rey Complex Figure).  Emotional Status On the Geriatric Depression Scale his score of 1/15 did not suggest depression. The only item he endorsed was low energy level.    Summary & Conclusions Coy "Peter Singh" Tennell is an 81 year old man who reported increasing difficulty with memory over the past two to three years. There was no report of any changes to his mood, habits or personality. He has been independently performing his basic and instrumental  activities of daily living with occasional reminders from his wife.   Neuropsychological evaluation identified deficits for retrieval from visual memory and on measures of executive functioning (i.e., visual working memory span, verbal fluency, set shifting, novel problem-solving). In contrast, his immediate attention span, auditory-verbal memory, naming to confrontation, visual-spatial organizational/constructional skills and ability to form abstract ideas were within normal expectations.  There were no indications of emotional disorder or difficulties with psychosocial functioning.  In conclusion, his neuropsychological profile would be consistent with Mild Cognitive Impairment. Taken together, his medical history and indications of subnormal executive functioning would suggest a vascular component to his cognitive decline.  Diagnostic Impression Mild Cognitive Impairment [G31.84]  Recommendations 1. Given his reduced abilities to encode and remember  visual details, he was advised to de-clutter his home and better organize his personal possessions so as to reduce the information load on his visual memory. This may help him to better recall the spatial locations of his personal items. He was advised to have ready access to his reading glasses at all times, again to reduce the load on visual encoding. Finally, it was suggested that he write down new information to be remembered in a pad that he can carry on his person.    2. His cognitive functioning should be monitored over time. A repeat neuropsychological evaluation could be considered in 18 months to two years, or sooner if clinically indicated, using the current results as a baseline.   I have appreciated the opportunity to evaluate Mr. Pavich. Please feel free to contact me with any comments or questions.    __________________ Antionette Poles, Ph.D Licensed Psychologist       ADDENDUM-NEUROPSYCHOLOGICAL TEST  RESULTS  Name:   Seward Carol. Madelynn Done  Date of Birth:  [birth year] [facility] MR#:  [id number] Dates of Evaluation: 10/06/14 & 10/12/14   Animal Naming Test Score= 14 18th (adjusted for age, gender and educational level)    Financial trader Score= 55/60 54th (adjusted for age, gender and educational level)    Controlled Oral Word Association Test Score=  22 words/1 repetition 7th (adjusted for age, gender and educational level)    Rey Complex Figure: copy       Score= 31/36  Normal       Trails A Score=   55s 0e 10th (adjusted for age, gender and educational level)  Trails B Score= 234s 2e (1e due to misperception)   5th (adjusted for age, gender and educational level)    Wechsler Adult Intelligence Scale-IV (WAIS-IV)  Subtest Scaled Score Percentile  Block Design 10 50th   Similarities 12 75th   Digit Span  Forward               Backward               Sequencing 11 11   8 12  63rd        63rd   25th  75th   Matrix Reasoning 12 75th   Coding   12 75th      Wechsler Memory Scale-IV (WMS-IV) Older Adult Battery  Index Index Score Percentile  Immediate Memory 93 32nd   Auditory Memory 94 34th    Visual Memory 76   5th    Delayed Memory 79   8th   Symbol Span Scaled score= 6   9th      Wisconsin Card Sorting Test- Administration discontinued due to confusion   [employer] Test-4 Subtest  Raw score Standard score Percentile  Word Reading 61/70 107 68th

## 2014-10-18 ENCOUNTER — Encounter: Payer: Self-pay | Admitting: Psychology

## 2014-10-23 DIAGNOSIS — E669 Obesity, unspecified: Secondary | ICD-10-CM | POA: Diagnosis not present

## 2014-10-23 DIAGNOSIS — R739 Hyperglycemia, unspecified: Secondary | ICD-10-CM | POA: Diagnosis not present

## 2014-10-23 DIAGNOSIS — G629 Polyneuropathy, unspecified: Secondary | ICD-10-CM | POA: Diagnosis not present

## 2014-10-23 DIAGNOSIS — I480 Paroxysmal atrial fibrillation: Secondary | ICD-10-CM | POA: Diagnosis not present

## 2014-10-23 DIAGNOSIS — I272 Other secondary pulmonary hypertension: Secondary | ICD-10-CM | POA: Diagnosis not present

## 2014-10-23 DIAGNOSIS — E785 Hyperlipidemia, unspecified: Secondary | ICD-10-CM | POA: Diagnosis not present

## 2014-10-23 DIAGNOSIS — Z1389 Encounter for screening for other disorder: Secondary | ICD-10-CM | POA: Diagnosis not present

## 2014-10-23 DIAGNOSIS — R339 Retention of urine, unspecified: Secondary | ICD-10-CM | POA: Diagnosis not present

## 2014-10-23 DIAGNOSIS — I6990 Unspecified sequelae of unspecified cerebrovascular disease: Secondary | ICD-10-CM | POA: Diagnosis not present

## 2014-10-23 DIAGNOSIS — Z7901 Long term (current) use of anticoagulants: Secondary | ICD-10-CM | POA: Diagnosis not present

## 2014-10-23 DIAGNOSIS — Z6832 Body mass index (BMI) 32.0-32.9, adult: Secondary | ICD-10-CM | POA: Diagnosis not present

## 2014-10-23 DIAGNOSIS — G3184 Mild cognitive impairment, so stated: Secondary | ICD-10-CM | POA: Diagnosis not present

## 2014-10-30 ENCOUNTER — Ambulatory Visit (INDEPENDENT_AMBULATORY_CARE_PROVIDER_SITE_OTHER): Payer: Medicare Other | Admitting: Pharmacist

## 2014-10-30 DIAGNOSIS — I4891 Unspecified atrial fibrillation: Secondary | ICD-10-CM

## 2014-10-30 LAB — POCT INR: INR: 2.2

## 2014-11-09 ENCOUNTER — Telehealth: Payer: Self-pay | Admitting: Interventional Cardiology

## 2014-11-09 NOTE — Telephone Encounter (Signed)
New Message  Mliss Sax- From [facility] calling about INR order from Dr. Tamala Julian. Please call back and discuss.

## 2014-11-09 NOTE — Telephone Encounter (Signed)
Returned call to [facility] @ [facility]. Pt is requesting to have is INR down at [facility] and readings called in the our Coumadin Clinic. She is requesting a written order be faxed to [phone] attn:Bernadette Absolol

## 2014-11-10 NOTE — Telephone Encounter (Signed)
Spoke with pt.  He called back to clarify getting his labs checked at [facility].  I explained that we would like him to continue to come to the clinic so we can see him face to face and evaluate any physical issues that may be occuring (i.e bruising, etc).  He was agreeable to this plan.

## 2014-11-10 NOTE — Telephone Encounter (Signed)
Called spoke with Mliss Sax, RN at [employer], clarified pt is in assisted living and is able to come into office for visits.  Advised we prefer pt's to come into our clinic if able, we will monitor outside labs if pt is unable to come into office, but pt is able to come into office we need to see pt in our clinic. She verbalized understanding, asked if we would call pt to relay this information to as well.  She is also going to call pt.  Attempted to call pt and notify him of above, LMOM TCB.

## 2014-12-11 ENCOUNTER — Ambulatory Visit (INDEPENDENT_AMBULATORY_CARE_PROVIDER_SITE_OTHER): Payer: Medicare Other

## 2014-12-11 DIAGNOSIS — I4891 Unspecified atrial fibrillation: Secondary | ICD-10-CM | POA: Diagnosis not present

## 2014-12-11 LAB — POCT INR: INR: 2.4

## 2014-12-26 ENCOUNTER — Ambulatory Visit (INDEPENDENT_AMBULATORY_CARE_PROVIDER_SITE_OTHER): Payer: Medicare Other | Admitting: *Deleted

## 2014-12-26 DIAGNOSIS — I4891 Unspecified atrial fibrillation: Secondary | ICD-10-CM

## 2014-12-26 LAB — POCT INR: INR: 2.2

## 2015-01-22 ENCOUNTER — Other Ambulatory Visit: Payer: Self-pay | Admitting: Interventional Cardiology

## 2015-01-22 ENCOUNTER — Ambulatory Visit (INDEPENDENT_AMBULATORY_CARE_PROVIDER_SITE_OTHER): Payer: Medicare Other | Admitting: *Deleted

## 2015-01-22 DIAGNOSIS — I4891 Unspecified atrial fibrillation: Secondary | ICD-10-CM | POA: Diagnosis not present

## 2015-01-22 LAB — POCT INR: INR: 2.8

## 2015-02-16 DIAGNOSIS — L821 Other seborrheic keratosis: Secondary | ICD-10-CM | POA: Diagnosis not present

## 2015-02-16 DIAGNOSIS — Z85828 Personal history of other malignant neoplasm of skin: Secondary | ICD-10-CM | POA: Diagnosis not present

## 2015-02-16 DIAGNOSIS — D1801 Hemangioma of skin and subcutaneous tissue: Secondary | ICD-10-CM | POA: Diagnosis not present

## 2015-02-16 DIAGNOSIS — C44719 Basal cell carcinoma of skin of left lower limb, including hip: Secondary | ICD-10-CM | POA: Diagnosis not present

## 2015-02-16 DIAGNOSIS — Z85820 Personal history of malignant melanoma of skin: Secondary | ICD-10-CM | POA: Diagnosis not present

## 2015-02-16 DIAGNOSIS — C44519 Basal cell carcinoma of skin of other part of trunk: Secondary | ICD-10-CM | POA: Diagnosis not present

## 2015-02-16 DIAGNOSIS — L570 Actinic keratosis: Secondary | ICD-10-CM | POA: Diagnosis not present

## 2015-02-16 DIAGNOSIS — L814 Other melanin hyperpigmentation: Secondary | ICD-10-CM | POA: Diagnosis not present

## 2015-03-05 ENCOUNTER — Ambulatory Visit (INDEPENDENT_AMBULATORY_CARE_PROVIDER_SITE_OTHER): Payer: Medicare Other | Admitting: *Deleted

## 2015-03-05 DIAGNOSIS — I4891 Unspecified atrial fibrillation: Secondary | ICD-10-CM | POA: Diagnosis not present

## 2015-03-05 LAB — POCT INR: INR: 2.2

## 2015-03-19 DIAGNOSIS — Z85828 Personal history of other malignant neoplasm of skin: Secondary | ICD-10-CM | POA: Diagnosis not present

## 2015-03-19 DIAGNOSIS — Z85820 Personal history of malignant melanoma of skin: Secondary | ICD-10-CM | POA: Diagnosis not present

## 2015-03-19 DIAGNOSIS — C44719 Basal cell carcinoma of skin of left lower limb, including hip: Secondary | ICD-10-CM | POA: Diagnosis not present

## 2015-03-26 ENCOUNTER — Other Ambulatory Visit: Payer: Self-pay | Admitting: Neurology

## 2015-03-26 DIAGNOSIS — N138 Other obstructive and reflux uropathy: Secondary | ICD-10-CM | POA: Diagnosis not present

## 2015-03-26 DIAGNOSIS — R339 Retention of urine, unspecified: Secondary | ICD-10-CM | POA: Diagnosis not present

## 2015-03-26 DIAGNOSIS — N390 Urinary tract infection, site not specified: Secondary | ICD-10-CM | POA: Diagnosis not present

## 2015-03-26 DIAGNOSIS — N401 Enlarged prostate with lower urinary tract symptoms: Secondary | ICD-10-CM | POA: Diagnosis not present

## 2015-03-27 ENCOUNTER — Other Ambulatory Visit: Payer: Self-pay | Admitting: *Deleted

## 2015-04-16 ENCOUNTER — Ambulatory Visit (INDEPENDENT_AMBULATORY_CARE_PROVIDER_SITE_OTHER): Payer: Medicare Other

## 2015-04-16 DIAGNOSIS — I4891 Unspecified atrial fibrillation: Secondary | ICD-10-CM | POA: Diagnosis not present

## 2015-04-16 LAB — POCT INR: INR: 1.6

## 2015-04-26 DIAGNOSIS — E785 Hyperlipidemia, unspecified: Secondary | ICD-10-CM | POA: Diagnosis not present

## 2015-04-26 DIAGNOSIS — Z125 Encounter for screening for malignant neoplasm of prostate: Secondary | ICD-10-CM | POA: Diagnosis not present

## 2015-04-26 DIAGNOSIS — R739 Hyperglycemia, unspecified: Secondary | ICD-10-CM | POA: Diagnosis not present

## 2015-04-26 DIAGNOSIS — N390 Urinary tract infection, site not specified: Secondary | ICD-10-CM | POA: Diagnosis not present

## 2015-04-30 ENCOUNTER — Other Ambulatory Visit: Payer: Self-pay | Admitting: Interventional Cardiology

## 2015-05-03 DIAGNOSIS — R269 Unspecified abnormalities of gait and mobility: Secondary | ICD-10-CM | POA: Diagnosis not present

## 2015-05-03 DIAGNOSIS — Z6832 Body mass index (BMI) 32.0-32.9, adult: Secondary | ICD-10-CM | POA: Diagnosis not present

## 2015-05-03 DIAGNOSIS — I272 Other secondary pulmonary hypertension: Secondary | ICD-10-CM | POA: Diagnosis not present

## 2015-05-03 DIAGNOSIS — Z7901 Long term (current) use of anticoagulants: Secondary | ICD-10-CM | POA: Diagnosis not present

## 2015-05-03 DIAGNOSIS — R339 Retention of urine, unspecified: Secondary | ICD-10-CM | POA: Diagnosis not present

## 2015-05-03 DIAGNOSIS — Z0000 Encounter for general adult medical examination without abnormal findings: Secondary | ICD-10-CM | POA: Diagnosis not present

## 2015-05-03 DIAGNOSIS — E669 Obesity, unspecified: Secondary | ICD-10-CM | POA: Diagnosis not present

## 2015-05-03 DIAGNOSIS — G629 Polyneuropathy, unspecified: Secondary | ICD-10-CM | POA: Diagnosis not present

## 2015-05-03 DIAGNOSIS — D696 Thrombocytopenia, unspecified: Secondary | ICD-10-CM | POA: Diagnosis not present

## 2015-05-03 DIAGNOSIS — G3184 Mild cognitive impairment, so stated: Secondary | ICD-10-CM | POA: Diagnosis not present

## 2015-05-03 DIAGNOSIS — I6990 Unspecified sequelae of unspecified cerebrovascular disease: Secondary | ICD-10-CM | POA: Diagnosis not present

## 2015-05-03 DIAGNOSIS — R0609 Other forms of dyspnea: Secondary | ICD-10-CM | POA: Diagnosis not present

## 2015-05-18 DIAGNOSIS — D2239 Melanocytic nevi of other parts of face: Secondary | ICD-10-CM | POA: Diagnosis not present

## 2015-05-18 DIAGNOSIS — L821 Other seborrheic keratosis: Secondary | ICD-10-CM | POA: Diagnosis not present

## 2015-05-18 DIAGNOSIS — L570 Actinic keratosis: Secondary | ICD-10-CM | POA: Diagnosis not present

## 2015-05-18 DIAGNOSIS — Z85828 Personal history of other malignant neoplasm of skin: Secondary | ICD-10-CM | POA: Diagnosis not present

## 2015-05-18 DIAGNOSIS — C44622 Squamous cell carcinoma of skin of right upper limb, including shoulder: Secondary | ICD-10-CM | POA: Diagnosis not present

## 2015-05-18 DIAGNOSIS — Z85820 Personal history of malignant melanoma of skin: Secondary | ICD-10-CM | POA: Diagnosis not present

## 2015-05-18 DIAGNOSIS — C44319 Basal cell carcinoma of skin of other parts of face: Secondary | ICD-10-CM | POA: Diagnosis not present

## 2015-05-18 DIAGNOSIS — D1801 Hemangioma of skin and subcutaneous tissue: Secondary | ICD-10-CM | POA: Diagnosis not present

## 2015-05-22 ENCOUNTER — Encounter: Payer: Self-pay | Admitting: *Deleted

## 2015-05-25 ENCOUNTER — Ambulatory Visit (INDEPENDENT_AMBULATORY_CARE_PROVIDER_SITE_OTHER): Payer: Medicare Other

## 2015-05-25 ENCOUNTER — Ambulatory Visit (INDEPENDENT_AMBULATORY_CARE_PROVIDER_SITE_OTHER): Payer: Medicare Other | Admitting: Interventional Cardiology

## 2015-05-25 ENCOUNTER — Encounter: Payer: Self-pay | Admitting: Interventional Cardiology

## 2015-05-25 DIAGNOSIS — I10 Essential (primary) hypertension: Secondary | ICD-10-CM | POA: Diagnosis not present

## 2015-05-25 DIAGNOSIS — I482 Chronic atrial fibrillation, unspecified: Secondary | ICD-10-CM

## 2015-05-25 DIAGNOSIS — E785 Hyperlipidemia, unspecified: Secondary | ICD-10-CM

## 2015-05-25 DIAGNOSIS — I639 Cerebral infarction, unspecified: Secondary | ICD-10-CM

## 2015-05-25 DIAGNOSIS — I4891 Unspecified atrial fibrillation: Secondary | ICD-10-CM | POA: Diagnosis not present

## 2015-05-25 DIAGNOSIS — Z7901 Long term (current) use of anticoagulants: Secondary | ICD-10-CM

## 2015-05-25 LAB — POCT INR: INR: 2.3

## 2015-05-25 NOTE — Patient Instructions (Signed)
Medication Instructions:   Your physician recommends that you continue on your current medications as directed. Please refer to the Current Medication list given to you today.   If you need a refill on your cardiac medications before your next appointment, please call your pharmacy.  Labwork:  NONE ORDER TODAY    Testing/Procedures:  NONE ORDER TODAY    Follow-Up:  Your physician wants you to follow-up in: ONE YEAR WITH DR SMITH  You will receive a reminder letter in the mail two months in advance. If you don't receive a letter, please call our office to schedule the follow-up appointment.    Any Other Special Instructions Will Be Listed Below (If Applicable).                                                                                                                                                   

## 2015-05-25 NOTE — Progress Notes (Signed)
Cardiology Office Note   Date:  05/25/2015   ID:  Peter Singh, DOB 09-08-1931, MRN [id number]  PCP:  Peter Reel, MD  Cardiologist:  Peter Grooms, MD   Chief Complaint  Patient presents with  . Atrial Fibrillation      History of Present Illness: Peter Singh is a 81 y.o. male who presents for chronic atrial fibrillation, chronic anticoagulation therapy, essential hypertension, chronic stable diastolic heart failure.  Peter Singh is limited by bilateral lower extremity numbness and recurring/chronic back pain. He denies limitations due to shortness of breath. There is no peripheral edema. He denies orthopnea. No palpitations, orthopnea, syncope, or excessive bleeding.    Past Medical History  Diagnosis Date  . Diverticulosis of colon (without mention of hemorrhage)   . Personal history of colonic polyps 1999 & 2004    adenomatous polyps  . Hyperlipemia   . Melanoma ([place])   . Atrial fibrillation ([place])   . Ventricular hypertrophy   . Hypertension     Past Surgical History  Procedure Laterality Date  . Tonsillectomy and adenoidectomy    . Knee surgery      right  . Pilonidal cyst drainage    . Spine surgery      tumor removed  . Schwanoma tumor lumbar spine       Current Outpatient Prescriptions  Medication Sig Dispense Refill  . atorvastatin (LIPITOR) 20 MG tablet Take 20 mg by mouth daily.      Marland Kitchen donepezil (ARICEPT) 10 MG tablet TAKE 1 TABLET ONCE DAILY. 30 tablet 5  . hydrochlorothiazide (HYDRODIURIL) 25 MG tablet Take 25 mg by mouth daily.      . Hydrocodone-Acetaminophen 5-300 MG TABS Take 1 tablet by mouth every 6 (six) hours as needed (PAIN).     Marland Kitchen sildenafil (VIAGRA) 100 MG tablet Take 100 mg by mouth daily as needed for erectile dysfunction.    . Tamsulosin HCl (FLOMAX) 0.4 MG CAPS Take 0.4 mg by mouth daily after supper.     . warfarin (COUMADIN) 5 MG tablet TAKE AS DIRECTED BY COUMADIN CLINIC. 120 tablet 1   No current  facility-administered medications for this visit.    Allergies:   Penicillins    Social History:  The patient  reports that he quit smoking about 39 years ago. His smoking use included Cigarettes. He smoked 3.00 packs per day. He has never used smokeless tobacco. He reports that he drinks alcohol. He reports that he does not use illicit drugs.   Family History:  The patient's family history includes Stroke in his father. There is no history of Neuropathy.    ROS:  Please see the history of present illness.   Otherwise, review of systems are positive for back discomfort.   All other systems are reviewed and negative.    PHYSICAL EXAM: VS:  BP 126/62 mmHg  Pulse 68  Ht 6\' 7"  (2.007 m)  Wt 125.828 kg (277 lb 6.4 oz)  BMI 31.24 kg/m2 , BMI Body mass index is 31.24 kg/(m^2). GEN: Well nourished, well developed, in no acute distress. Mild obesity HEENT: normal Neck: no JVD, carotid bruits, or masses Cardiac: IIRR.  There is no murmur, rub, or gallop. There is no edema. Respiratory:  clear to auscultation bilaterally, normal work of breathing. GI: soft, nontender, nondistended, + BS MS: no deformity or atrophy Skin: warm and dry, no rash Neuro:  Strength and sensation are intact Psych: euthymic mood, full affect   EKG:  EKG is  ordered today. The ekg reveals atrial fibrillation, left axis deviation, controlled ventricular response, nonspecific T-wave abnormality.   Recent Labs: 07/04/2014: TSH 1.080    Lipid Panel No results found for: CHOL, TRIG, HDL, CHOLHDL, VLDL, LDLCALC, LDLDIRECT    Wt Readings from Last 3 Encounters:  05/25/15 125.828 kg (277 lb 6.4 oz)  07/26/14 126.554 kg (279 lb)  07/04/14 127.461 kg (281 lb)      Other studies Reviewed: Additional studies/ records that were reviewed today include: Electronic medical record. The findings include no new data.    ASSESSMENT AND PLAN:  1. Chronic atrial fibrillation (HCC) Excellent rate control on no  medication therapy  2. Essential hypertension Controlled  3. Hyperlipidemia Followed by primary care  4. Chronic anticoagulation Coumadin clinic today    Current medicines are reviewed at length with the patient today.  The patient has the following concerns regarding medicines: none.  The following changes/actions have been instituted:    Discuss anatomy history of his current cardiovascular condition. He is cautioned to call if swelling, dyspnea, chest discomfort, syncope/near syncope, or palpitations.  Labs/ tests ordered today include:  No orders of the defined types were placed in this encounter.     Disposition:   FU with HS in 1 years  Signed, Peter Grooms, MD  05/25/2015 10:29 AM    [address], [place], [place]  [address] Phone: [phone]; Fax: [phone]

## 2015-06-06 DIAGNOSIS — N138 Other obstructive and reflux uropathy: Secondary | ICD-10-CM | POA: Diagnosis not present

## 2015-06-06 DIAGNOSIS — N401 Enlarged prostate with lower urinary tract symptoms: Secondary | ICD-10-CM | POA: Diagnosis not present

## 2015-06-11 DIAGNOSIS — I480 Paroxysmal atrial fibrillation: Secondary | ICD-10-CM | POA: Diagnosis not present

## 2015-06-11 DIAGNOSIS — J181 Lobar pneumonia, unspecified organism: Secondary | ICD-10-CM | POA: Diagnosis not present

## 2015-06-11 DIAGNOSIS — Z6831 Body mass index (BMI) 31.0-31.9, adult: Secondary | ICD-10-CM | POA: Diagnosis not present

## 2015-06-11 DIAGNOSIS — Z7901 Long term (current) use of anticoagulants: Secondary | ICD-10-CM | POA: Diagnosis not present

## 2015-06-11 DIAGNOSIS — R05 Cough: Secondary | ICD-10-CM | POA: Diagnosis not present

## 2015-06-14 DIAGNOSIS — Z6831 Body mass index (BMI) 31.0-31.9, adult: Secondary | ICD-10-CM | POA: Diagnosis not present

## 2015-06-14 DIAGNOSIS — R339 Retention of urine, unspecified: Secondary | ICD-10-CM | POA: Diagnosis not present

## 2015-06-14 DIAGNOSIS — R05 Cough: Secondary | ICD-10-CM | POA: Diagnosis not present

## 2015-06-14 DIAGNOSIS — Z7901 Long term (current) use of anticoagulants: Secondary | ICD-10-CM | POA: Diagnosis not present

## 2015-06-14 DIAGNOSIS — J181 Lobar pneumonia, unspecified organism: Secondary | ICD-10-CM | POA: Diagnosis not present

## 2015-06-14 DIAGNOSIS — I480 Paroxysmal atrial fibrillation: Secondary | ICD-10-CM | POA: Diagnosis not present

## 2015-06-14 LAB — PROTIME-INR: INR: 1.9 — AB (ref 0.9–1.1)

## 2015-06-19 DIAGNOSIS — Z85820 Personal history of malignant melanoma of skin: Secondary | ICD-10-CM | POA: Diagnosis not present

## 2015-06-19 DIAGNOSIS — C44319 Basal cell carcinoma of skin of other parts of face: Secondary | ICD-10-CM | POA: Diagnosis not present

## 2015-06-19 DIAGNOSIS — Z85828 Personal history of other malignant neoplasm of skin: Secondary | ICD-10-CM | POA: Diagnosis not present

## 2015-06-22 ENCOUNTER — Ambulatory Visit (INDEPENDENT_AMBULATORY_CARE_PROVIDER_SITE_OTHER): Payer: Medicare Other | Admitting: *Deleted

## 2015-06-22 DIAGNOSIS — I4891 Unspecified atrial fibrillation: Secondary | ICD-10-CM

## 2015-06-22 LAB — POCT INR: INR: 2.3

## 2015-07-08 DIAGNOSIS — D696 Thrombocytopenia, unspecified: Secondary | ICD-10-CM

## 2015-07-08 HISTORY — DX: Thrombocytopenia, unspecified: D69.6

## 2015-07-20 ENCOUNTER — Ambulatory Visit (INDEPENDENT_AMBULATORY_CARE_PROVIDER_SITE_OTHER): Payer: Medicare Other | Admitting: *Deleted

## 2015-07-20 DIAGNOSIS — I4891 Unspecified atrial fibrillation: Secondary | ICD-10-CM | POA: Diagnosis not present

## 2015-07-20 LAB — POCT INR: INR: 2.8

## 2015-07-31 DIAGNOSIS — Z23 Encounter for immunization: Secondary | ICD-10-CM | POA: Diagnosis not present

## 2015-08-30 ENCOUNTER — Ambulatory Visit (INDEPENDENT_AMBULATORY_CARE_PROVIDER_SITE_OTHER): Payer: Medicare Other | Admitting: *Deleted

## 2015-08-30 DIAGNOSIS — L570 Actinic keratosis: Secondary | ICD-10-CM | POA: Diagnosis not present

## 2015-08-30 DIAGNOSIS — D2262 Melanocytic nevi of left upper limb, including shoulder: Secondary | ICD-10-CM | POA: Diagnosis not present

## 2015-08-30 DIAGNOSIS — I4891 Unspecified atrial fibrillation: Secondary | ICD-10-CM

## 2015-08-30 DIAGNOSIS — D224 Melanocytic nevi of scalp and neck: Secondary | ICD-10-CM | POA: Diagnosis not present

## 2015-08-30 DIAGNOSIS — D485 Neoplasm of uncertain behavior of skin: Secondary | ICD-10-CM | POA: Diagnosis not present

## 2015-08-30 DIAGNOSIS — Z85820 Personal history of malignant melanoma of skin: Secondary | ICD-10-CM | POA: Diagnosis not present

## 2015-08-30 DIAGNOSIS — C44619 Basal cell carcinoma of skin of left upper limb, including shoulder: Secondary | ICD-10-CM | POA: Diagnosis not present

## 2015-08-30 DIAGNOSIS — L821 Other seborrheic keratosis: Secondary | ICD-10-CM | POA: Diagnosis not present

## 2015-08-30 DIAGNOSIS — D1801 Hemangioma of skin and subcutaneous tissue: Secondary | ICD-10-CM | POA: Diagnosis not present

## 2015-08-30 DIAGNOSIS — D225 Melanocytic nevi of trunk: Secondary | ICD-10-CM | POA: Diagnosis not present

## 2015-08-30 DIAGNOSIS — Z85828 Personal history of other malignant neoplasm of skin: Secondary | ICD-10-CM | POA: Diagnosis not present

## 2015-08-30 LAB — POCT INR: INR: 3.3

## 2015-09-20 ENCOUNTER — Ambulatory Visit (INDEPENDENT_AMBULATORY_CARE_PROVIDER_SITE_OTHER): Payer: Medicare Other | Admitting: Pharmacist

## 2015-09-20 DIAGNOSIS — Z7901 Long term (current) use of anticoagulants: Secondary | ICD-10-CM | POA: Diagnosis not present

## 2015-09-20 DIAGNOSIS — I482 Chronic atrial fibrillation, unspecified: Secondary | ICD-10-CM

## 2015-09-20 LAB — PROTIME-INR: INR: 2 — AB (ref 0.9–1.1)

## 2015-10-01 ENCOUNTER — Other Ambulatory Visit: Payer: Self-pay | Admitting: Neurology

## 2015-10-01 ENCOUNTER — Telehealth: Payer: Self-pay | Admitting: Neurology

## 2015-10-01 NOTE — Telephone Encounter (Signed)
Spouse called to request refill of donepezil (ARICEPT) 10 MG tablet. Has enough medication to last through Friday 10/05/15. Appointment scheduled 11/14/15.

## 2015-10-02 DIAGNOSIS — L089 Local infection of the skin and subcutaneous tissue, unspecified: Secondary | ICD-10-CM | POA: Diagnosis not present

## 2015-10-02 DIAGNOSIS — Z85820 Personal history of malignant melanoma of skin: Secondary | ICD-10-CM | POA: Diagnosis not present

## 2015-10-02 DIAGNOSIS — D485 Neoplasm of uncertain behavior of skin: Secondary | ICD-10-CM | POA: Diagnosis not present

## 2015-10-02 DIAGNOSIS — L570 Actinic keratosis: Secondary | ICD-10-CM | POA: Diagnosis not present

## 2015-10-02 DIAGNOSIS — Z85828 Personal history of other malignant neoplasm of skin: Secondary | ICD-10-CM | POA: Diagnosis not present

## 2015-10-02 MED ORDER — DONEPEZIL HCL 10 MG PO TABS: 10.0000 mg | ORAL_TABLET | Freq: Every day | ORAL | Status: DC

## 2015-10-02 NOTE — Telephone Encounter (Signed)
Called and spoke to spouse. Advised I gave enough medication to get pt through may for when they come for appt. Advised to make sure they come for f/u.  Advised Dr Jaynee Eagles can refill after they come for appt. She verbalized understanding and appreciation.

## 2015-10-18 ENCOUNTER — Ambulatory Visit (INDEPENDENT_AMBULATORY_CARE_PROVIDER_SITE_OTHER): Payer: Medicare Other | Admitting: Internal Medicine

## 2015-10-18 DIAGNOSIS — Z7901 Long term (current) use of anticoagulants: Secondary | ICD-10-CM | POA: Diagnosis not present

## 2015-10-18 DIAGNOSIS — I482 Chronic atrial fibrillation, unspecified: Secondary | ICD-10-CM

## 2015-10-18 DIAGNOSIS — I4891 Unspecified atrial fibrillation: Secondary | ICD-10-CM | POA: Diagnosis not present

## 2015-10-18 LAB — PROTIME-INR: INR: 2.2 — AB (ref 0.9–1.1)

## 2015-10-22 ENCOUNTER — Other Ambulatory Visit: Payer: Self-pay | Admitting: Interventional Cardiology

## 2015-11-08 DIAGNOSIS — D696 Thrombocytopenia, unspecified: Secondary | ICD-10-CM | POA: Diagnosis not present

## 2015-11-08 DIAGNOSIS — I739 Peripheral vascular disease, unspecified: Secondary | ICD-10-CM | POA: Diagnosis not present

## 2015-11-08 DIAGNOSIS — C439 Malignant melanoma of skin, unspecified: Secondary | ICD-10-CM | POA: Diagnosis not present

## 2015-11-08 DIAGNOSIS — E668 Other obesity: Secondary | ICD-10-CM | POA: Diagnosis not present

## 2015-11-08 DIAGNOSIS — I6990 Unspecified sequelae of unspecified cerebrovascular disease: Secondary | ICD-10-CM | POA: Diagnosis not present

## 2015-11-08 DIAGNOSIS — Z6831 Body mass index (BMI) 31.0-31.9, adult: Secondary | ICD-10-CM | POA: Diagnosis not present

## 2015-11-08 DIAGNOSIS — I480 Paroxysmal atrial fibrillation: Secondary | ICD-10-CM | POA: Diagnosis not present

## 2015-11-08 DIAGNOSIS — R739 Hyperglycemia, unspecified: Secondary | ICD-10-CM | POA: Diagnosis not present

## 2015-11-08 DIAGNOSIS — E784 Other hyperlipidemia: Secondary | ICD-10-CM | POA: Diagnosis not present

## 2015-11-08 DIAGNOSIS — Z7901 Long term (current) use of anticoagulants: Secondary | ICD-10-CM | POA: Diagnosis not present

## 2015-11-08 DIAGNOSIS — I272 Other secondary pulmonary hypertension: Secondary | ICD-10-CM | POA: Diagnosis not present

## 2015-11-08 DIAGNOSIS — G3184 Mild cognitive impairment, so stated: Secondary | ICD-10-CM | POA: Diagnosis not present

## 2015-11-14 ENCOUNTER — Ambulatory Visit (INDEPENDENT_AMBULATORY_CARE_PROVIDER_SITE_OTHER): Payer: Medicare Other | Admitting: Neurology

## 2015-11-14 VITALS — BP 126/82 | HR 79 | Ht 79.0 in | Wt 276.4 lb

## 2015-11-14 DIAGNOSIS — E538 Deficiency of other specified B group vitamins: Secondary | ICD-10-CM

## 2015-11-14 DIAGNOSIS — R5381 Other malaise: Secondary | ICD-10-CM | POA: Diagnosis not present

## 2015-11-14 DIAGNOSIS — M5416 Radiculopathy, lumbar region: Secondary | ICD-10-CM | POA: Diagnosis not present

## 2015-11-14 DIAGNOSIS — E559 Vitamin D deficiency, unspecified: Secondary | ICD-10-CM

## 2015-11-14 DIAGNOSIS — G3184 Mild cognitive impairment, so stated: Secondary | ICD-10-CM | POA: Diagnosis not present

## 2015-11-14 DIAGNOSIS — G629 Polyneuropathy, unspecified: Secondary | ICD-10-CM

## 2015-11-14 DIAGNOSIS — R531 Weakness: Secondary | ICD-10-CM | POA: Diagnosis not present

## 2015-11-14 DIAGNOSIS — R2689 Other abnormalities of gait and mobility: Secondary | ICD-10-CM | POA: Diagnosis not present

## 2015-11-14 MED ORDER — DONEPEZIL HCL 10 MG PO TABS: 10.0000 mg | ORAL_TABLET | Freq: Every day | ORAL | Status: DC

## 2015-11-14 NOTE — Progress Notes (Signed)
[id number]    Provider:  Dr Jaynee Eagles Referring Provider: [name], MD Primary Care Physician:  Precious Reel, MD  [facility]    Provider: Dr Jaynee Eagles Referring Provider: Precious Reel, MD Primary Care Physician: Precious Reel, MD  CC: Cognitive changes  Interval history: He is here with his wife. More short term memory loss. Wife is paying all the bills now. Wife is here and provides information. They saw Dr. Valentina Shaggy last year. Discussed repeat testing if needed. He is not playing golf anymore. No significant snoring. No excessive daytime somnolence, sometimes he naps. He hasn't exercised as much. He has numbness in his feet, getting number, getting worse. No pain just discomfort. Slowly progressive still with the memory but nothing significant.   HPI: Peter Singh is a 82 y.o. male here as a referral from Dr. Virgina Jock for memory loss. Having general loss of memory and forgetting names, asking the same thing over and over, forgetting appointments and names. Changes started several years ahgo, unsure when and are progressive. Wife accompanies and provides history. 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 It was benign. He is excessively tired tired during the day, he naps in the afternoons, some snoring at night. No witnessed apneic events. In his feet, he gets sharp pains in the feet. A few times he has gotten lost in the car. Was previously evaluated in my clinic for neuropathy, this is a new complaint. Patient has 2 drinks of bourbon a night, wife confirms. His recall of remote events appears intact, can recite the address of where he lived as achild. Appears to be more recent memories. He is still living independently with his wife and performs all his ADLs and IADLs. No other focal neurologic deficits. He has neuropathy. No FHx of neurodegenerative disease. He continues to have worsening numbness in the distal extremities.   Reviewed notes, labs  and imaging from outside physicians, which showed: hgba1c 6.0, TSH wnl, B12 WNL, folate WNL, Other normal labs include: IFE, Pan-ANCA, ace, hiv, esr, ana, rpr, hepc ab, crp, paraneoplastic ab panel, RF, B1, ethanol (neg), mma. No etiology for neuropathy found except glucose intolerance and possibly alcohol use. EMG/NCS showed severe length-dependent axonal polyneuropathy.   Previous visit 07/04/2014 HPI: Peter Singh is a 82 y.o. male here as a referral from Dr. Virgina Jock for Neuropathy  Neuropathy started 4-5 years ago. No inciting factors, no medications that are know to cause neuropathy, no chemotherapy. He went to [facility] and they found a growth in his spinal column. He doesn't have any feeling in the tips of his fingers and in his feet, just numb. No tingling or burning. He is having fine motor skill difficulties. Dropping things. In all the fingers possibly in the whole hands. No cramping at night. Is the same all the time, continuous all day. It doesn't vary too much, not painful just numb. The changes go up to the ankles. Progressive. No burning just numbness. Balance is poor. He has to be careful to stop from falling. Stumbling. Has 2 drinks a day of bourbon. No chemotherapy, no long-term usage of medications that can cause neuropathy. 2 drinks of bourbon a day. No other focal neurologic symptoms. No FHx of neuropathy.   Reviewed notes, labs and imaging from outside physicians, which showed:   Ct showed No acute intracranial abnormalities including mass lesion or mass effect, hydrocephalus, extra-axial fluid collection, midline shift, hemorrhage, or acute infarction, large ischemic events (personally reviewed images), did show  chronic mild global atrophy as well as chronic ischemic white matter non-specific changes. Last INR was 2.7.  Review of Systems: Patient complains of symptoms per HPI as well as the following symptoms: numbness. Denies CP or SOB Pertinent negatives per HPI. All others  negative.     Social History   Social History  . Marital Status: Married    Spouse Name: N/A  . Number of Children: 2  . Years of Education: N/A   Occupational History  . retired    Social History Main Topics  . Smoking status: Former Smoker -- 3.00 packs/day    Types: Cigarettes    Quit date: 06/09/1975  . Smokeless tobacco: Never Used  . Alcohol Use: 0.0 oz/week    0 Standard drinks or equivalent per week     Comment: 2 drinks per day  . Drug Use: No  . Sexual Activity: Not on file   Other Topics Concern  . Not on file   Social History Narrative   Patient is married with 2 children    Patient is retired    Patient lives at [place] History  Problem Relation Age of Onset  . Stroke Father   . Neuropathy Neg Hx     Past Medical History  Diagnosis Date  . Diverticulosis of colon (without mention of hemorrhage)   . Personal history of colonic polyps 1999 & 2004    adenomatous polyps  . Hyperlipemia   . Melanoma ([place])   . Atrial fibrillation ([place])   . Ventricular hypertrophy   . Hypertension     Past Surgical History  Procedure Laterality Date  . Tonsillectomy and adenoidectomy    . Knee surgery      right  . Pilonidal cyst drainage    . Spine surgery      tumor removed  . Schwanoma tumor lumbar spine      Current Outpatient Prescriptions  Medication Sig Dispense Refill  . atorvastatin (LIPITOR) 20 MG tablet Take 20 mg by mouth daily.      Marland Kitchen donepezil (ARICEPT) 10 MG tablet Take 1 tablet (10 mg total) by mouth daily. 30 tablet 1  . hydrochlorothiazide (HYDRODIURIL) 25 MG tablet Take 25 mg by mouth daily.      . Hydrocodone-Acetaminophen 5-300 MG TABS Take 1 tablet by mouth every 6 (six) hours as needed (PAIN).     . Tamsulosin HCl (FLOMAX) 0.4 MG CAPS Take 0.4 mg by mouth daily after supper.     . warfarin (COUMADIN) 5 MG tablet TAKE AS DIRECTED BY COUMADIN CLINIC. 120 tablet 0   No current facility-administered medications for this  visit.    Allergies as of 11/14/2015 - Review Complete 11/14/2015  Allergen Reaction Noted  . Penicillins Rash 04/08/2011    Vitals: BP 126/82 mmHg  Pulse 79  Ht '6\' 7"'  (2.007 m)  Wt 276 lb 6.4 oz (125.374 kg)  BMI 31.13 kg/m2 Last Weight:  Wt Readings from Last 1 Encounters:  11/14/15 276 lb 6.4 oz (125.374 kg)   Last Height:   Ht Readings from Last 1 Encounters:  11/14/15 '6\' 7"'  (2.007 m)    [facility]    Provider: Dr Jaynee Eagles Referring Provider: Precious Reel, MD Primary Care Physician: Precious Reel, MD  CC: Cognitive changes  HPI: Peter Singh is a 82 y.o. male here as a referral from Dr. Virgina Jock for memory loss. Having general loss of memory and forgetting names, asking the same thing over  and over, forgetting appointments and names. Changes started several years ahgo, unsure when and are progressive. Wife accompanies and provides history. 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 It was benign. A few times he has gotten lost in the car. Patient has 2 drinks of bourbon a night, wife confirms. His recall of remote events appears intact, can recite the address of where he lived as achild. Appears to be more recent memories. He is still living independently with his wife and performs all his ADLs and most IADLs, wifr has taken over the bills. No other focal neurologic deficits. He has neuropathy which has been extensively worked up. No FHx of neurodegenerative disease. He continues to have worsening numbness in the distal extremities but not painful. Memory slowly progressively worsening.   Will recheck B12. Also will check Vit D as new literature to support association with memory loss and low Vitamin D levels. Never did Sjogren's labs for his neuropathy will order todat. Advised to keep social, play golf again, keep engaged, hobbies and exercise. HgbA1c was abnormal in the past, 6.0, this can be a risk factor in polyneuropathy advised to monitor closely  with pcp otherwise extensive neuropathy panel negative. Advised him again to declutter his home as Dr. Valentina Shaggy had advised on Neurocognitive testing significant for MCI that suggests a vascular component. Advised to follow with pcp to closely manage vascular risk factors.  MRI showed generalized atrophy that is more pronounced in the mesial temporal lobes and age-related chronic microvascular ischemic change. Discussed all again today, follow up in 6 months.  Reviewed notes, labs and imaging from outside physicians, which showed: hgba1c 6.0, TSH wnl, B12 WNL, folate WNL, Other normal labs include: IFE, Pan-ANCA, ace, hiv, esr, ana, rpr, hepc ab, crp, paraneoplastic ab panel, RF, B1, ethanol (neg), mma. No etiology for neuropathy found except glucose intolerance and possibly alcohol use. EMG/NCS showed severe length-dependent axonal polyneuropathy.   Previous visit 07/04/2014 HPI: Peter Singh is a 82 y.o. male here as a referral from Dr. Virgina Jock for Neuropathy  Neuropathy started 4-5 years ago. No inciting factors, no medications that are know to cause neuropathy, no chemotherapy. He went to [facility] and they found a growth in his spinal column. He doesn't have any feeling in the tips of his fingers and in his feet, just numb. No tingling or burning. He is having fine motor skill difficulties. Dropping things. In all the fingers possibly in the whole hands. No cramping at night. Is the same all the time, continuous all day. It doesn't vary too much, not painful just numb. The changes go up to the ankles. Progressive. No burning just numbness. Balance is poor. He has to be careful to stop from falling. Stumbling. Has 2 drinks a day of bourbon. No chemotherapy, no long-term usage of medications that can cause neuropathy. 2 drinks of bourbon a day. No other focal neurologic symptoms. No FHx of neuropathy. For his deconditioning and lumar radiculopathy, recommend PT will order.  Reviewed notes, labs and imaging  from outside physicians, which showed:   Ct showed No acute intracranial abnormalities including mass lesion or mass effect, hydrocephalus, extra-axial fluid collection, midline shift, hemorrhage, or acute infarction, large ischemic events (personally reviewed images), did show chronic mild global atrophy as well as chronic ischemic white matter non-specific changes. Last INR was 2.7.   Sarina Ill, MD  [facility] [address] [place], [place] [address]  Phone [phone] Fax [phone]  A total of 50  minutes was spent face-to-face with this patient and his wife. Over half this time was spent on counseling patient on the peripheral neuropathy, lumbar radiculopathy, deconditioning and MCI diagnosis and different diagnostic and therapeutic options available.

## 2015-11-14 NOTE — Patient Instructions (Signed)
Remember to drink plenty of fluid, eat healthy meals and do not skip any meals. Try to eat protein with a every meal and eat a healthy snack such as fruit or nuts in between meals. Try to keep a regular sleep-wake schedule and try to exercise daily, particularly in the form of walking, 20-30 minutes a day, if you can.   As far as your medications are concerned, I would like to suggest: Continue Aricept  As far as diagnostic testing: labs  I would like to see you back in 1 year, sooner if we need to. Please call us with any interim questions, concerns, problems, updates or refill requests.   Our phone number is [phone]. We also have an after hours call service for urgent matters and there is a physician on-call for urgent questions. For any emergencies you know to call 911 or go to the nearest emergency room

## 2015-11-15 ENCOUNTER — Ambulatory Visit (INDEPENDENT_AMBULATORY_CARE_PROVIDER_SITE_OTHER): Payer: Medicare Other | Admitting: Cardiovascular Disease

## 2015-11-15 ENCOUNTER — Telehealth: Payer: Self-pay | Admitting: *Deleted

## 2015-11-15 DIAGNOSIS — I482 Chronic atrial fibrillation, unspecified: Secondary | ICD-10-CM

## 2015-11-15 DIAGNOSIS — I4891 Unspecified atrial fibrillation: Secondary | ICD-10-CM | POA: Diagnosis not present

## 2015-11-15 DIAGNOSIS — Z5181 Encounter for therapeutic drug level monitoring: Secondary | ICD-10-CM | POA: Diagnosis not present

## 2015-11-15 LAB — VITAMIN D PNL(25-HYDRXY+1,25-DIHY)-BLD
VIT D 1 25 DIHYDROXY: 41.5 pg/mL (ref 19.9–79.3)
Vit D, 25-Hydroxy: 7.8 ng/mL — ABNORMAL LOW (ref 30.0–100.0)

## 2015-11-15 LAB — POCT INR: INR: 2.3

## 2015-11-15 LAB — SJOGREN'S SYNDROME ANTIBODS(SSA + SSB)
ENA SSA (RO) Ab: <0.2 AI (ref 0.0–0.9)
ENA SSB (LA) Ab: <0.2 AI (ref 0.0–0.9)

## 2015-11-15 LAB — B12 AND FOLATE PANEL: VITAMIN B 12: 346 pg/mL (ref 211–946)

## 2015-11-15 NOTE — Telephone Encounter (Signed)
Tried calling home number. Got busy signal. Tried work number. This number has been disconnected.   OK to inform pt that labs normal per Dr Jaynee Eagles if he calls back

## 2015-11-15 NOTE — Telephone Encounter (Signed)
-----   Message from Melvenia Beam, MD sent at 11/15/2015  5:27 PM EDT ----- Labs normal thanks

## 2015-11-16 ENCOUNTER — Encounter: Payer: Self-pay | Admitting: Neurology

## 2015-11-20 ENCOUNTER — Telehealth: Payer: Self-pay | Admitting: *Deleted

## 2015-11-20 NOTE — Telephone Encounter (Signed)
Caller MRS.Westman/WIFE           CID [id number]  Patient NIKET TRIGGS [place] Pt's Dr Jaynee Eagles        Area Code 336 Phone# [phone]                                                             Disp:Y/N N If Y = C/B If No Response In 94minutes ============================================================

## 2015-11-20 NOTE — Telephone Encounter (Signed)
LVM on home number that labs normal per Dr Jaynee Eagles. Gave [facility] phone number if pt has further questions.

## 2015-11-20 NOTE — Telephone Encounter (Signed)
Called wife back. Relayed Dr Jaynee Eagles message below. She verbalized understanding and states that I answered her questions. No further questions at this time. Told her to call if she does have any further questions/concerns.

## 2015-11-20 NOTE — Telephone Encounter (Signed)
Since his Vitamin D level is normal, looks good, just continue whatever he is doing and no need to add more thanks. Vitamin D is not low, looks good thanks

## 2015-11-20 NOTE — Telephone Encounter (Signed)
Dr Jaynee Eagles- please advise, I did not see any recommendations in your last OV note.

## 2015-11-22 DIAGNOSIS — R278 Other lack of coordination: Secondary | ICD-10-CM | POA: Diagnosis not present

## 2015-11-22 DIAGNOSIS — R2689 Other abnormalities of gait and mobility: Secondary | ICD-10-CM | POA: Diagnosis not present

## 2015-11-22 DIAGNOSIS — R5381 Other malaise: Secondary | ICD-10-CM | POA: Diagnosis not present

## 2015-11-22 DIAGNOSIS — R531 Weakness: Secondary | ICD-10-CM | POA: Diagnosis not present

## 2015-11-22 DIAGNOSIS — M5416 Radiculopathy, lumbar region: Secondary | ICD-10-CM | POA: Diagnosis not present

## 2015-11-26 ENCOUNTER — Encounter: Payer: Self-pay | Admitting: *Deleted

## 2015-11-26 NOTE — Progress Notes (Signed)
Faxed signed POC for PT by Dr Jaynee Eagles back to [facility] dated 11/22/2015. Fax: [phone]. Received fax confirmation.

## 2015-11-27 DIAGNOSIS — R278 Other lack of coordination: Secondary | ICD-10-CM | POA: Diagnosis not present

## 2015-11-27 DIAGNOSIS — M5416 Radiculopathy, lumbar region: Secondary | ICD-10-CM | POA: Diagnosis not present

## 2015-11-27 DIAGNOSIS — R5381 Other malaise: Secondary | ICD-10-CM | POA: Diagnosis not present

## 2015-11-27 DIAGNOSIS — R2689 Other abnormalities of gait and mobility: Secondary | ICD-10-CM | POA: Diagnosis not present

## 2015-11-27 DIAGNOSIS — R531 Weakness: Secondary | ICD-10-CM | POA: Diagnosis not present

## 2015-11-29 DIAGNOSIS — R5381 Other malaise: Secondary | ICD-10-CM | POA: Diagnosis not present

## 2015-11-29 DIAGNOSIS — R2689 Other abnormalities of gait and mobility: Secondary | ICD-10-CM | POA: Diagnosis not present

## 2015-11-29 DIAGNOSIS — R531 Weakness: Secondary | ICD-10-CM | POA: Diagnosis not present

## 2015-11-29 DIAGNOSIS — M5416 Radiculopathy, lumbar region: Secondary | ICD-10-CM | POA: Diagnosis not present

## 2015-11-29 DIAGNOSIS — R278 Other lack of coordination: Secondary | ICD-10-CM | POA: Diagnosis not present

## 2015-12-04 DIAGNOSIS — R278 Other lack of coordination: Secondary | ICD-10-CM | POA: Diagnosis not present

## 2015-12-04 DIAGNOSIS — R5381 Other malaise: Secondary | ICD-10-CM | POA: Diagnosis not present

## 2015-12-04 DIAGNOSIS — M5416 Radiculopathy, lumbar region: Secondary | ICD-10-CM | POA: Diagnosis not present

## 2015-12-04 DIAGNOSIS — R2689 Other abnormalities of gait and mobility: Secondary | ICD-10-CM | POA: Diagnosis not present

## 2015-12-04 DIAGNOSIS — R531 Weakness: Secondary | ICD-10-CM | POA: Diagnosis not present

## 2015-12-12 DIAGNOSIS — R2689 Other abnormalities of gait and mobility: Secondary | ICD-10-CM | POA: Diagnosis not present

## 2015-12-12 DIAGNOSIS — R5381 Other malaise: Secondary | ICD-10-CM | POA: Diagnosis not present

## 2015-12-12 DIAGNOSIS — M5416 Radiculopathy, lumbar region: Secondary | ICD-10-CM | POA: Diagnosis not present

## 2015-12-12 DIAGNOSIS — R278 Other lack of coordination: Secondary | ICD-10-CM | POA: Diagnosis not present

## 2015-12-12 DIAGNOSIS — R531 Weakness: Secondary | ICD-10-CM | POA: Diagnosis not present

## 2015-12-13 ENCOUNTER — Ambulatory Visit (INDEPENDENT_AMBULATORY_CARE_PROVIDER_SITE_OTHER): Payer: Medicare Other | Admitting: Interventional Cardiology

## 2015-12-13 DIAGNOSIS — I482 Chronic atrial fibrillation, unspecified: Secondary | ICD-10-CM

## 2015-12-13 DIAGNOSIS — Z5181 Encounter for therapeutic drug level monitoring: Secondary | ICD-10-CM | POA: Diagnosis not present

## 2015-12-13 DIAGNOSIS — I4891 Unspecified atrial fibrillation: Secondary | ICD-10-CM | POA: Diagnosis not present

## 2015-12-13 LAB — PROTIME-INR: INR: 2.3 — AB (ref 0.9–1.1)

## 2015-12-19 ENCOUNTER — Encounter: Payer: Self-pay | Admitting: *Deleted

## 2015-12-19 NOTE — Progress Notes (Signed)
Faxed signed discharge summary back to [facility]. [phone]. Received confirmation.

## 2015-12-24 DIAGNOSIS — R350 Frequency of micturition: Secondary | ICD-10-CM | POA: Diagnosis not present

## 2015-12-24 DIAGNOSIS — R300 Dysuria: Secondary | ICD-10-CM | POA: Diagnosis not present

## 2015-12-24 DIAGNOSIS — N401 Enlarged prostate with lower urinary tract symptoms: Secondary | ICD-10-CM | POA: Diagnosis not present

## 2016-01-03 DIAGNOSIS — D225 Melanocytic nevi of trunk: Secondary | ICD-10-CM | POA: Diagnosis not present

## 2016-01-03 DIAGNOSIS — L821 Other seborrheic keratosis: Secondary | ICD-10-CM | POA: Diagnosis not present

## 2016-01-03 DIAGNOSIS — D1801 Hemangioma of skin and subcutaneous tissue: Secondary | ICD-10-CM | POA: Diagnosis not present

## 2016-01-03 DIAGNOSIS — C4441 Basal cell carcinoma of skin of scalp and neck: Secondary | ICD-10-CM | POA: Diagnosis not present

## 2016-01-03 DIAGNOSIS — L111 Transient acantholytic dermatosis [Grover]: Secondary | ICD-10-CM | POA: Diagnosis not present

## 2016-01-03 DIAGNOSIS — Z85828 Personal history of other malignant neoplasm of skin: Secondary | ICD-10-CM | POA: Diagnosis not present

## 2016-01-03 DIAGNOSIS — L565 Disseminated superficial actinic porokeratosis (DSAP): Secondary | ICD-10-CM | POA: Diagnosis not present

## 2016-01-03 DIAGNOSIS — L570 Actinic keratosis: Secondary | ICD-10-CM | POA: Diagnosis not present

## 2016-01-03 DIAGNOSIS — Z85820 Personal history of malignant melanoma of skin: Secondary | ICD-10-CM | POA: Diagnosis not present

## 2016-01-17 ENCOUNTER — Ambulatory Visit (INDEPENDENT_AMBULATORY_CARE_PROVIDER_SITE_OTHER): Payer: Medicare Other | Admitting: Internal Medicine

## 2016-01-17 DIAGNOSIS — I482 Chronic atrial fibrillation, unspecified: Secondary | ICD-10-CM

## 2016-01-17 DIAGNOSIS — I4891 Unspecified atrial fibrillation: Secondary | ICD-10-CM | POA: Diagnosis not present

## 2016-01-17 LAB — PROTIME-INR: INR: 2 — AB (ref 0.9–1.1)

## 2016-01-20 ENCOUNTER — Other Ambulatory Visit: Payer: Self-pay | Admitting: Interventional Cardiology

## 2016-02-21 ENCOUNTER — Ambulatory Visit (INDEPENDENT_AMBULATORY_CARE_PROVIDER_SITE_OTHER): Payer: Medicare Other

## 2016-02-21 DIAGNOSIS — I4891 Unspecified atrial fibrillation: Secondary | ICD-10-CM | POA: Diagnosis not present

## 2016-02-21 DIAGNOSIS — I482 Chronic atrial fibrillation, unspecified: Secondary | ICD-10-CM

## 2016-02-21 LAB — POCT INR: INR: 1.6

## 2016-03-06 ENCOUNTER — Ambulatory Visit (INDEPENDENT_AMBULATORY_CARE_PROVIDER_SITE_OTHER): Payer: Medicare Other | Admitting: Internal Medicine

## 2016-03-06 DIAGNOSIS — I4891 Unspecified atrial fibrillation: Secondary | ICD-10-CM | POA: Diagnosis not present

## 2016-03-06 DIAGNOSIS — I482 Chronic atrial fibrillation, unspecified: Secondary | ICD-10-CM

## 2016-03-06 DIAGNOSIS — Z5181 Encounter for therapeutic drug level monitoring: Secondary | ICD-10-CM | POA: Diagnosis not present

## 2016-03-06 LAB — PROTIME-INR: INR: 2.9 — AB (ref 0.9–1.1)

## 2016-03-11 ENCOUNTER — Inpatient Hospital Stay (HOSPITAL_COMMUNITY)
Admission: EM | Admit: 2016-03-11 | Discharge: 2016-03-14 | DRG: 872 | Disposition: A | Discharge status: Skilled Nursing Facility | Payer: Medicare Other | Attending: Internal Medicine | Admitting: Internal Medicine

## 2016-03-11 ENCOUNTER — Encounter (HOSPITAL_COMMUNITY): Payer: Self-pay | Admitting: Emergency Medicine

## 2016-03-11 ENCOUNTER — Inpatient Hospital Stay (HOSPITAL_COMMUNITY): Payer: Medicare Other

## 2016-03-11 ENCOUNTER — Emergency Department (HOSPITAL_COMMUNITY): Payer: Medicare Other

## 2016-03-11 DIAGNOSIS — F0280 Dementia in other diseases classified elsewhere without behavioral disturbance: Secondary | ICD-10-CM | POA: Diagnosis present

## 2016-03-11 DIAGNOSIS — R338 Other retention of urine: Secondary | ICD-10-CM | POA: Diagnosis present

## 2016-03-11 DIAGNOSIS — N390 Urinary tract infection, site not specified: Secondary | ICD-10-CM | POA: Diagnosis not present

## 2016-03-11 DIAGNOSIS — E876 Hypokalemia: Secondary | ICD-10-CM | POA: Diagnosis present

## 2016-03-11 DIAGNOSIS — Z86010 Personal history of colonic polyps: Secondary | ICD-10-CM

## 2016-03-11 DIAGNOSIS — E878 Other disorders of electrolyte and fluid balance, not elsewhere classified: Secondary | ICD-10-CM | POA: Diagnosis present

## 2016-03-11 DIAGNOSIS — Z880 Allergy status to penicillin: Secondary | ICD-10-CM

## 2016-03-11 DIAGNOSIS — I119 Hypertensive heart disease without heart failure: Secondary | ICD-10-CM | POA: Diagnosis present

## 2016-03-11 DIAGNOSIS — N179 Acute kidney failure, unspecified: Secondary | ICD-10-CM | POA: Diagnosis present

## 2016-03-11 DIAGNOSIS — Z79899 Other long term (current) drug therapy: Secondary | ICD-10-CM

## 2016-03-11 DIAGNOSIS — N401 Enlarged prostate with lower urinary tract symptoms: Secondary | ICD-10-CM | POA: Diagnosis present

## 2016-03-11 DIAGNOSIS — N400 Enlarged prostate without lower urinary tract symptoms: Secondary | ICD-10-CM

## 2016-03-11 DIAGNOSIS — I4891 Unspecified atrial fibrillation: Secondary | ICD-10-CM | POA: Diagnosis present

## 2016-03-11 DIAGNOSIS — A419 Sepsis, unspecified organism: Principal | ICD-10-CM | POA: Diagnosis present

## 2016-03-11 DIAGNOSIS — N200 Calculus of kidney: Secondary | ICD-10-CM | POA: Diagnosis not present

## 2016-03-11 DIAGNOSIS — Z823 Family history of stroke: Secondary | ICD-10-CM | POA: Diagnosis not present

## 2016-03-11 DIAGNOSIS — R319 Hematuria, unspecified: Secondary | ICD-10-CM | POA: Diagnosis not present

## 2016-03-11 DIAGNOSIS — E785 Hyperlipidemia, unspecified: Secondary | ICD-10-CM | POA: Diagnosis present

## 2016-03-11 DIAGNOSIS — Z85820 Personal history of malignant melanoma of skin: Secondary | ICD-10-CM | POA: Diagnosis not present

## 2016-03-11 DIAGNOSIS — Z87891 Personal history of nicotine dependence: Secondary | ICD-10-CM

## 2016-03-11 DIAGNOSIS — G309 Alzheimer's disease, unspecified: Secondary | ICD-10-CM | POA: Diagnosis present

## 2016-03-11 DIAGNOSIS — Z66 Do not resuscitate: Secondary | ICD-10-CM | POA: Diagnosis present

## 2016-03-11 DIAGNOSIS — Z7901 Long term (current) use of anticoagulants: Secondary | ICD-10-CM

## 2016-03-11 DIAGNOSIS — R791 Abnormal coagulation profile: Secondary | ICD-10-CM | POA: Diagnosis present

## 2016-03-11 DIAGNOSIS — R0602 Shortness of breath: Secondary | ICD-10-CM | POA: Diagnosis not present

## 2016-03-11 DIAGNOSIS — B961 Klebsiella pneumoniae [K. pneumoniae] as the cause of diseases classified elsewhere: Secondary | ICD-10-CM | POA: Diagnosis present

## 2016-03-11 DIAGNOSIS — R112 Nausea with vomiting, unspecified: Secondary | ICD-10-CM | POA: Diagnosis not present

## 2016-03-11 DIAGNOSIS — D696 Thrombocytopenia, unspecified: Secondary | ICD-10-CM | POA: Diagnosis not present

## 2016-03-11 DIAGNOSIS — R509 Fever, unspecified: Secondary | ICD-10-CM | POA: Diagnosis not present

## 2016-03-11 DIAGNOSIS — R1111 Vomiting without nausea: Secondary | ICD-10-CM | POA: Diagnosis not present

## 2016-03-11 DIAGNOSIS — G609 Hereditary and idiopathic neuropathy, unspecified: Secondary | ICD-10-CM | POA: Diagnosis present

## 2016-03-11 DIAGNOSIS — I482 Chronic atrial fibrillation: Secondary | ICD-10-CM

## 2016-03-11 DIAGNOSIS — R05 Cough: Secondary | ICD-10-CM | POA: Diagnosis not present

## 2016-03-11 LAB — CBC WITH DIFFERENTIAL/PLATELET
BASOS PCT: 0 %
Basophils Absolute: 0 10*3/uL (ref 0.0–0.1)
EOS PCT: 0 %
Eosinophils Absolute: 0 10*3/uL (ref 0.0–0.7)
HEMATOCRIT: 45.5 % (ref 39.0–52.0)
HEMOGLOBIN: 15.7 g/dL (ref 13.0–17.0)
LYMPHS PCT: 2 %
Lymphs Abs: 0.1 10*3/uL — ABNORMAL LOW (ref 0.7–4.0)
MCH: 31.2 pg (ref 26.0–34.0)
MCHC: 34.5 g/dL (ref 30.0–36.0)
MCV: 90.3 fL (ref 78.0–100.0)
MONOS PCT: 1 %
Monocytes Absolute: 0 10*3/uL — ABNORMAL LOW (ref 0.1–1.0)
NEUTROS PCT: 97 %
Neutro Abs: 3.9 10*3/uL (ref 1.7–7.7)
Platelets: 85 10*3/uL — ABNORMAL LOW (ref 150–400)
RBC: 5.04 MIL/uL (ref 4.22–5.81)
RDW: 14.4 % (ref 11.5–15.5)
WBC: 4 10*3/uL (ref 4.0–10.5)

## 2016-03-11 LAB — COMPREHENSIVE METABOLIC PANEL
ALBUMIN: 3.7 g/dL (ref 3.5–5.0)
ALT: 18 U/L (ref 17–63)
AST: 28 U/L (ref 15–41)
Alkaline Phosphatase: 68 U/L (ref 38–126)
Anion gap: 10 (ref 5–15)
BILIRUBIN TOTAL: 1.7 mg/dL — AB (ref 0.3–1.2)
BUN: 16 mg/dL (ref 6–20)
CO2: 23 mmol/L (ref 22–32)
CREATININE: 1.34 mg/dL — AB (ref 0.61–1.24)
Calcium: 9.6 mg/dL (ref 8.9–10.3)
Chloride: 107 mmol/L (ref 101–111)
GFR calc Af Amer: 56 mL/min — ABNORMAL LOW (ref >60)
GFR, EST NON AFRICAN AMERICAN: 48 mL/min — AB (ref >60)
GLUCOSE: 148 mg/dL — AB (ref 65–99)
POTASSIUM: 3 mmol/L — AB (ref 3.5–5.1)
Sodium: 140 mmol/L (ref 135–145)
TOTAL PROTEIN: 6 g/dL — AB (ref 6.5–8.1)

## 2016-03-11 LAB — URINALYSIS, ROUTINE W REFLEX MICROSCOPIC
GLUCOSE, UA: NEGATIVE mg/dL
Ketones, ur: NEGATIVE mg/dL
Nitrite: POSITIVE — AB
Protein, ur: 100 mg/dL — AB
SPECIFIC GRAVITY, URINE: 1.017 (ref 1.005–1.030)
pH: 5.5 (ref 5.0–8.0)

## 2016-03-11 LAB — PROTIME-INR
INR: 3.24
PROTHROMBIN TIME: 33.8 s — AB (ref 11.4–15.2)

## 2016-03-11 LAB — URINE MICROSCOPIC-ADD ON

## 2016-03-11 LAB — I-STAT CG4 LACTIC ACID, ED
Lactic Acid, Venous: 4.69 mmol/L (ref 0.5–1.9)
Lactic Acid, Venous: 5 mmol/L (ref 0.5–1.9)

## 2016-03-11 LAB — MRSA PCR SCREENING: MRSA BY PCR: NEGATIVE

## 2016-03-11 LAB — LACTIC ACID, PLASMA
Lactic Acid, Venous: 5.9 mmol/L (ref 0.5–1.9)
Lactic Acid, Venous: 4.7 mmol/L (ref 0.5–1.9)

## 2016-03-11 LAB — I-STAT TROPONIN, ED: Troponin i, poc: 0.08 ng/mL (ref 0.00–0.08)

## 2016-03-11 MED ORDER — DEXTROSE-NACL 5-0.9 % IV SOLN
INTRAVENOUS | Status: DC
  Administered 2016-03-11 – 2016-03-12 (×2): via INTRAVENOUS

## 2016-03-11 MED ORDER — AZTREONAM 2 G IJ SOLR
2.0000 g | Freq: Once | INTRAMUSCULAR | Status: AC
  Administered 2016-03-11: 2 g via INTRAVENOUS
  Filled 2016-03-11: qty 2

## 2016-03-11 MED ORDER — POTASSIUM CHLORIDE 10 MEQ/100ML IV SOLN
10.0000 meq | INTRAVENOUS | Status: AC
  Administered 2016-03-11 (×4): 10 meq via INTRAVENOUS
  Filled 2016-03-11 (×4): qty 100

## 2016-03-11 MED ORDER — SODIUM CHLORIDE 0.9 % IV BOLUS (SEPSIS)
1000.0000 mL | Freq: Once | INTRAVENOUS | Status: AC
  Administered 2016-03-11: 1000 mL via INTRAVENOUS

## 2016-03-11 MED ORDER — LEVOFLOXACIN IN D5W 750 MG/150ML IV SOLN
750.0000 mg | Freq: Once | INTRAVENOUS | Status: AC
  Administered 2016-03-11: 750 mg via INTRAVENOUS
  Filled 2016-03-11: qty 150

## 2016-03-11 MED ORDER — ONDANSETRON HCL 4 MG/2ML IJ SOLN: 4.0000 mg | Freq: Four times a day (QID) | INTRAMUSCULAR | Status: DC | PRN

## 2016-03-11 MED ORDER — ATORVASTATIN CALCIUM 10 MG PO TABS
20.0000 mg | ORAL_TABLET | Freq: Every evening | ORAL | Status: DC
  Administered 2016-03-11 – 2016-03-13 (×3): 20 mg via ORAL
  Filled 2016-03-11 (×3): qty 2

## 2016-03-11 MED ORDER — ACETAMINOPHEN 650 MG RE SUPP: 650.0000 mg | Freq: Four times a day (QID) | RECTAL | Status: DC | PRN

## 2016-03-11 MED ORDER — CHLORHEXIDINE GLUCONATE CLOTH 2 % EX PADS: 6.0000 | MEDICATED_PAD | Freq: Every day | CUTANEOUS | Status: DC

## 2016-03-11 MED ORDER — LEVOFLOXACIN IN D5W 750 MG/150ML IV SOLN
750.0000 mg | INTRAVENOUS | Status: DC
  Administered 2016-03-12: 750 mg via INTRAVENOUS
  Filled 2016-03-11: qty 150

## 2016-03-11 MED ORDER — ONDANSETRON HCL 4 MG PO TABS: 4.0000 mg | ORAL_TABLET | Freq: Four times a day (QID) | ORAL | Status: DC | PRN

## 2016-03-11 MED ORDER — ACETAMINOPHEN 325 MG PO TABS
650.0000 mg | ORAL_TABLET | Freq: Four times a day (QID) | ORAL | Status: DC | PRN
  Administered 2016-03-11: 650 mg via ORAL
  Filled 2016-03-11: qty 2

## 2016-03-11 MED ORDER — MUPIROCIN 2 % EX OINT: 1.0000 "application " | TOPICAL_OINTMENT | Freq: Two times a day (BID) | CUTANEOUS | Status: DC

## 2016-03-11 MED ORDER — SODIUM CHLORIDE 0.9% FLUSH
3.0000 mL | Freq: Two times a day (BID) | INTRAVENOUS | Status: DC
  Administered 2016-03-11 – 2016-03-13 (×6): 3 mL via INTRAVENOUS

## 2016-03-11 MED ORDER — ENOXAPARIN SODIUM 40 MG/0.4ML ~~LOC~~ SOLN: 40.0000 mg | SUBCUTANEOUS | Status: DC

## 2016-03-11 MED ORDER — TAMSULOSIN HCL 0.4 MG PO CAPS
0.8000 mg | ORAL_CAPSULE | Freq: Every day | ORAL | Status: DC
  Administered 2016-03-11 – 2016-03-13 (×3): 0.8 mg via ORAL
  Filled 2016-03-11 (×3): qty 2

## 2016-03-11 MED ORDER — DONEPEZIL HCL 10 MG PO TABS
10.0000 mg | ORAL_TABLET | Freq: Every day | ORAL | Status: DC
  Administered 2016-03-11 – 2016-03-13 (×3): 10 mg via ORAL
  Filled 2016-03-11 (×3): qty 1

## 2016-03-11 NOTE — Progress Notes (Signed)
Increased lactic acid; on #6 liter of NS. Discussed with Dr. Oletta Darter. BP became soft, but now responding to fluids. Continue close observation.

## 2016-03-11 NOTE — ED Triage Notes (Signed)
Per EMS patient comes from [facility] for hematuria and vomiting per wife had some blood in it that started this morning.  Patient is on coumadin. Patient has PMH BPH with urinary obstruction and self cath at night.

## 2016-03-11 NOTE — Progress Notes (Signed)
Pharmacy Antibiotic Follow-up Note  Peter Singh is a 83 y.o. year-old male admitted on 03/11/2016 with complaint of N/V, fatigue, hematuria but denies urinary symptoms. The patient is currently on day 1 of Levaquin for UTI. Hx of self-catheterization,   Assessment/Plan: Levaquin 750mg  IV q24 hr Aztreonam 2gm x1  Temp (24hrs), Avg:98.5 F (36.9 C), Min:98.5 F (36.9 C), Max:98.5 F (36.9 C)   Recent Labs Lab 03/11/16 0928  WBC 4.0    Recent Labs Lab 03/11/16 0928  CREATININE 1.34*   Estimated Creatinine Clearance: 64.9 mL/min (by C-G formula based on SCr of 1.34 mg/dL).    Allergies  Allergen Reactions  . Penicillins Rash    Has patient had a PCN reaction causing immediate rash, facial/tongue/throat swelling, SOB or lightheadedness with hypotension: unknown Has patient had a PCN reaction causing severe rash involving mucus membranes or skin necrosis: unknown Has patient had a PCN reaction that required hospitalization : unknown Has patient had a PCN reaction occurring within the last 10 years: no If all of the above answers are "NO", then may proceed with Cephalosporin use.    Antimicrobials this admission: 9/5 Aztreonam 2gm x1 in ED 9/5 Levaquin >>  Levels/dose changes this admission:  Microbiology results: 9/5 BCx: sent 9/5 UCx: sent  Thank you for allowing pharmacy to be a part of this patient's care.  Minda Ditto PharmD Pager [phone] 03/11/2016, 10:29 AM

## 2016-03-11 NOTE — ED Notes (Signed)
Bed: WA11 Expected date:  Expected time:  Means of arrival:  Comments: EMS 

## 2016-03-11 NOTE — ED Provider Notes (Signed)
[facility] DEPT Provider Note   CSN: [id number] Arrival date & time: 03/11/16  [id number]     History   Chief Complaint Chief Complaint  Patient presents with  . Hematuria    HPI Peter Singh is a 82 y.o. male.  HPI   Patient is an 82 year old male with history of self-catheterization, A. fib on chronic anticoagulation, dementia. He is presenting today with nausea and vomiting. This started yesterday. No abdominal pain. Wife reports that he's been feeling more fatigued than usual. Noted by EMS to have blood at the meatus. Denies any fever. Denies any urinary symptoms. Denies any chest pain.  Past Medical History:  Diagnosis Date  . Atrial fibrillation ([place])   . Diverticulosis of colon (without mention of hemorrhage)   . Hyperlipemia   . Hypertension   . Melanoma ([place])   . Personal history of colonic polyps 1999 & 2004   adenomatous polyps  . Ventricular hypertrophy     Patient Active Problem List   Diagnosis Date Noted  . Sepsis ([place]) 03/11/2016  . Cognitive changes 07/30/2014  . Hereditary and idiopathic peripheral neuropathy 07/06/2014  . Essential hypertension 03/24/2014  . Hyperlipidemia 03/24/2014  . Chronic anticoagulation 03/24/2014  . Atrial fibrillation ([place]) 05/17/2013    Past Surgical History:  Procedure Laterality Date  . KNEE SURGERY     right  . PILONIDAL CYST DRAINAGE    . schwanoma tumor lumbar spine    . SPINE SURGERY     tumor removed  . TONSILLECTOMY AND ADENOIDECTOMY         Home Medications    Prior to Admission medications   Medication Sig Start Date End Date Taking? Authorizing Provider  atorvastatin (LIPITOR) 20 MG tablet Take 20 mg by mouth every evening.    Yes Historical Provider, MD  donepezil (ARICEPT) 10 MG tablet Take 1 tablet (10 mg total) by mouth daily. Patient taking differently: Take 10 mg by mouth at bedtime.  11/14/15  Yes Melvenia Beam, MD  hydrochlorothiazide (HYDRODIURIL) 25 MG tablet Take 25 mg by mouth  daily.     Yes Historical Provider, MD  Tamsulosin HCl (FLOMAX) 0.4 MG CAPS Take 0.8 mg by mouth daily after supper.    Yes Historical Provider, MD  warfarin (COUMADIN) 5 MG tablet TAKE AS DIRECTED BY COUMADIN CLINIC. Patient taking differently: TAKE AS DIRECTED BY COUMADIN CLINIC. take 7.5mg  by mouth once daily except on wednesdays take 5mg .. 01/21/16  Yes [name], MD    Family History Family History  Problem Relation Age of Onset  . Stroke Father   . Neuropathy Neg Hx     Social History Social History  Substance Use Topics  . Smoking status: Former Smoker    Packs/day: 3.00    Types: Cigarettes    Quit date: 06/09/1975  . Smokeless tobacco: Never Used  . Alcohol use 0.0 oz/week     Comment: 2 drinks per day     Allergies   Penicillins   Review of Systems Review of Systems  Constitutional: Positive for appetite change and fatigue. Negative for activity change and fever.  Respiratory: Negative for shortness of breath.   Cardiovascular: Negative for chest pain, palpitations and leg swelling.  Gastrointestinal: Negative for abdominal pain.  Genitourinary: Positive for hematuria. Negative for dysuria.  Neurological: Negative for numbness.  All other systems reviewed and are negative.    Physical Exam Updated Vital Signs BP (!) 135/54   Pulse 93   Temp 99.6 F (37.6 C) (  Oral)   Resp (!) 23   Ht 6\' 7"  (2.007 m)   Wt 268 lb 8.3 oz (121.8 kg)   SpO2 97%   BMI 30.25 kg/m   Physical Exam  Constitutional: He is oriented to person, place, and time. He appears well-nourished.  HENT:  Head: Normocephalic.  Dry cracked lips. Dry mucous membranes.  Eyes: Conjunctivae are normal.  Cardiovascular: Normal rate.   Irregularly irregular  Pulmonary/Chest: Effort normal and breath sounds normal. He has no wheezes.  Abdominal: He exhibits no distension. There is no tenderness. There is no rebound and no guarding.  Genitourinary: Penis normal.  Musculoskeletal: Normal  range of motion.  No CVA tenderness.  Neurological: He is oriented to person, place, and time. No cranial nerve deficit.  Skin: Skin is warm and dry. He is not diaphoretic.  Psychiatric: He has a normal mood and affect. His behavior is normal.     ED Treatments / Results  Labs (all labs ordered are listed, but only abnormal results are displayed) Labs Reviewed  CBC WITH DIFFERENTIAL/PLATELET - Abnormal; Notable for the following:       Result Value   Platelets 85 (*)    Lymphs Abs 0.1 (*)    Monocytes Absolute 0.0 (*)    All other components within normal limits  COMPREHENSIVE METABOLIC PANEL - Abnormal; Notable for the following:    Potassium 3.0 (*)    Glucose, Bld 148 (*)    Creatinine, Ser 1.34 (*)    Total Protein 6.0 (*)    Total Bilirubin 1.7 (*)    GFR calc non Af Amer 48 (*)    GFR calc Af Amer 56 (*)    All other components within normal limits  URINALYSIS, ROUTINE W REFLEX MICROSCOPIC (NOT AT [facility]) - Abnormal; Notable for the following:    Color, Urine RED (*)    APPearance CLOUDY (*)    Hgb urine dipstick LARGE (*)    Bilirubin Urine SMALL (*)    Protein, ur 100 (*)    Nitrite POSITIVE (*)    Leukocytes, UA SMALL (*)    All other components within normal limits  PROTIME-INR - Abnormal; Notable for the following:    Prothrombin Time 33.8 (*)    All other components within normal limits  URINE MICROSCOPIC-ADD ON - Abnormal; Notable for the following:    Squamous Epithelial / LPF 0-5 (*)    Bacteria, UA MANY (*)    All other components within normal limits  LACTIC ACID, PLASMA - Abnormal; Notable for the following:    Lactic Acid, Venous 5.9 (*)    All other components within normal limits  LACTIC ACID, PLASMA - Abnormal; Notable for the following:    Lactic Acid, Venous 4.7 (*)    All other components within normal limits  LACTIC ACID, PLASMA - Abnormal; Notable for the following:    Lactic Acid, Venous 3.0 (*)    All other components within normal limits    LACTIC ACID, PLASMA - Abnormal; Notable for the following:    Lactic Acid, Venous 2.0 (*)    All other components within normal limits  I-STAT CG4 LACTIC ACID, ED - Abnormal; Notable for the following:    Lactic Acid, Venous 4.69 (*)    All other components within normal limits  I-STAT CG4 LACTIC ACID, ED - Abnormal; Notable for the following:    Lactic Acid, Venous 5.00 (*)    All other components within normal limits  MRSA PCR SCREENING  URINE CULTURE  CULTURE, BLOOD (ROUTINE X 2)  CULTURE, BLOOD (ROUTINE X 2)  COMPREHENSIVE METABOLIC PANEL  CBC  LACTIC ACID, PLASMA  I-STAT TROPOININ, ED    EKG  EKG Interpretation None       Radiology   Procedures Procedures (including critical care time)  Medications Ordered in ED Medications  donepezil (ARICEPT) tablet 10 mg (10 mg Oral Given 03/11/16 2107)  atorvastatin (LIPITOR) tablet 20 mg (20 mg Oral Given 03/11/16 1711)  tamsulosin (FLOMAX) capsule 0.8 mg (0.8 mg Oral Given 03/11/16 1711)  sodium chloride flush (NS) 0.9 % injection 3 mL (3 mLs Intravenous Given 03/11/16 2107)  dextrose 5 %-0.9 % sodium chloride infusion ( Intravenous New Bag/Given 03/11/16 1530)  acetaminophen (TYLENOL) tablet 650 mg (650 mg Oral Given 03/11/16 1606)    Or  acetaminophen (TYLENOL) suppository 650 mg ( Rectal See Alternative 03/11/16 1606)  ondansetron (ZOFRAN) tablet 4 mg (not administered)    Or  ondansetron (ZOFRAN) injection 4 mg (not administered)  levofloxacin (LEVAQUIN) IVPB 750 mg (not administered)  sodium chloride 0.9 % bolus 1,000 mL (0 mLs Intravenous Stopped 03/11/16 1145)  sodium chloride 0.9 % bolus 1,000 mL (0 mLs Intravenous Stopped 03/11/16 1300)    And  sodium chloride 0.9 % bolus 1,000 mL (0 mLs Intravenous Stopped 03/11/16 1115)    And  sodium chloride 0.9 % bolus 1,000 mL (0 mLs Intravenous Stopped 03/11/16 1312)  levofloxacin (LEVAQUIN) IVPB 750 mg (0 mg Intravenous Stopped 03/11/16 1216)  aztreonam (AZACTAM) 2 g in dextrose 5 % 50 mL  IVPB (0 g Intravenous Stopped 03/11/16 1120)  potassium chloride 10 mEq in 100 mL IVPB (10 mEq Intravenous Given 03/11/16 1911)  sodium chloride 0.9 % bolus 1,000 mL (1,000 mLs Intravenous Given 03/11/16 1726)     Initial Impression / Assessment and Plan / ED Course  I have reviewed the triage vital signs and the nursing notes.  Pertinent labs & imaging results that were available during my care of the patient were reviewed by me and considered in my medical decision making (see chart for details).  Clinical Course     Patient is an 82 year old male with history of self-catheterization, A. fib on chronic anticoagulation, dementia. Wife notes that he's been feeling unwell for last several days. He is feeling so fatigued when he is unable to get a bed. He had several episodes of emesis. We'll check today for signs of infection. He appears very dry on exam, nothing focal. Concerning for urinary tract infection versus pneumonia. We'll give fluids. Patient did have blood clots at the meatus per nursing and EMS. We will irrigate with Foley. Patient on Coumadin, we'll check INR.  Lactic very elevated. Code sepsis caleld, blood cultures drawn and appropriate fluids ordered. Vital signs remain normal, no fever. Will treat for urine origin of sepsis given hisotyr of self cath.  Awaiting CXR and UA.  Will admit to stepdown, discussed with hosptilist. CT stone ordered, not returned upon patient moving upstairs.   CRITICAL CARE Performed by: Gardiner Sleeper Total critical care time: 45 minutes Critical care time was exclusive of separately billable procedures and treating other patients. Critical care was necessary to treat or prevent imminent or life-threatening deterioration. Critical care was time spent personally by me on the following activities: development of treatment plan with patient and/or surrogate as well as nursing, discussions with consultants, evaluation of patient's response to treatment,  examination of patient, obtaining history from patient or surrogate, ordering and performing treatments and  interventions, ordering and review of laboratory studies, ordering and review of radiographic studies, pulse oximetry and re-evaluation of patient's condition.   Final Clinical Impressions(s) / ED Diagnoses   Final diagnoses:  Sepsis, due to unspecified organism [facility])    New Prescriptions Current Discharge Medication List       [place], MD 03/12/16 [phone]

## 2016-03-11 NOTE — ED Notes (Signed)
Patient transported to CT 

## 2016-03-11 NOTE — ED Notes (Signed)
Bed: [id number] Expected date:  Expected time:  Means of arrival:  Comments: 82 y/o M withdrawal

## 2016-03-11 NOTE — Progress Notes (Signed)
[place] [place] Progress Note Patient Name: Peter Singh DOB: 06-15-1932 MRN: [id number]   Date of Service  03/11/2016  HPI/Events of Note  Lactic Acid = 5.9 >> 4.7.  eICU Interventions  Will order: 1. Repeat Lactic Acid at 12 midnight, 3 AM and 6 AM.     Intervention Category Major Interventions: Acid-Base disturbance - evaluation and management  Sommer,Steven Eugene 03/11/2016, 7:42 PM

## 2016-03-11 NOTE — Progress Notes (Signed)
DR. Oletta Darter, Elink, Informed of Lactic acid of 4.7, which is trending down. Will continue to follow.

## 2016-03-11 NOTE — Progress Notes (Addendum)
[place] Progress Note Patient Name: Peter Singh DOB: Jul 20, 1931 MRN: [id number]   Date of Service  03/11/2016  HPI/Events of Note  Lactic Acid = 4.69 >> 5.0 >> 5.9. Last LVEF = 55%-60%.  eICU Interventions  Will bolus with 0.9 NaCl 1 liter over 1 hour now.      Intervention Category Major Interventions: Acid-Base disturbance - evaluation and management  Peter Singh Eugene 03/11/2016, 5:18 PM

## 2016-03-11 NOTE — ED Notes (Signed)
Dr Thomasene Lot made aware bed assignment needs to be changed to step down

## 2016-03-11 NOTE — H&P (Signed)
History and Physical    Peter Singh [id number] DOB: Jun 29, 1932 DOA: 03/11/2016  PCP: Precious Reel, MD   Patient coming from:  Home  Chief Complaint: Nausea and not feeling well.   HPI: Peter Singh is a 82 y.o. male with medical history significant of atrial fibrillation and urinary retention related to BPH who presents to the hospital with the chief complaint of nausea and generalized malaise. For last 2 weeks patient has been not feeling well, occasional chills, generalized weakness, moderate lumbar back pain and intermittent dysuria. This morning around 4:30 in the morning he felt acutely ill, positive nausea and more severe weakness. The weakness was persistent, no improving or worsening factors, associated with nausea. Patient was evaluated by staff at the assisted living, due to patient's overall condition EMS was called and patient was brought to the hospital for further evaluation. Patient ambulates dependently, occasionally he uses a walker, he has mild dementia, and takes Flomax for BPH, for last 4-5 years he self catheterizes at night. He has had urinary tract infections in the past but not recently.  ED Course: Patient has received IV fluids and brought antibiotics.  Review of Systems:  1. General positive for malaise, no weight gain or weight loss. Positive for occasional chills. 2. Cardiovascular. No angina or claudication, no PND or orthopnea no palpitations 3. Pulmonary no shortness of breath cough or hemoptysis 4. Gastrointestinal. Positive for nausea as mentioned in history present illness, no vomiting or diarrhea, normal appetite 5. Musculoskeletal positive for back pain, lumbar region, for last 2 weeks. 6. Dermatology no rashes 7. Neurology positive for dysuria as mentioned in history present illness 8. Endocrine no tremors heat or cold intolerance 9. Hematology no easy bruisability or frequent infections 10. ENT no runny nose or sore throat  Past Medical  History:  Diagnosis Date  . Atrial fibrillation ([place])   . Diverticulosis of colon (without mention of hemorrhage)   . Hyperlipemia   . Hypertension   . Melanoma ([place])   . Personal history of colonic polyps 1999 & 2004   adenomatous polyps  . Ventricular hypertrophy     Past Surgical History:  Procedure Laterality Date  . KNEE SURGERY     right  . PILONIDAL CYST DRAINAGE    . schwanoma tumor lumbar spine    . SPINE SURGERY     tumor removed  . TONSILLECTOMY AND ADENOIDECTOMY       reports that he quit smoking about 40 years ago. His smoking use included Cigarettes. He smoked 3.00 packs per day. He has never used smokeless tobacco. He reports that he drinks alcohol. He reports that he does not use drugs.  Allergies  Allergen Reactions  . Penicillins Rash    Has patient had a PCN reaction causing immediate rash, facial/tongue/throat swelling, SOB or lightheadedness with hypotension: unknown Has patient had a PCN reaction causing severe rash involving mucus membranes or skin necrosis: unknown Has patient had a PCN reaction that required hospitalization : unknown Has patient had a PCN reaction occurring within the last 10 years: no If all of the above answers are "NO", then may proceed with Cephalosporin use.     Family History  Problem Relation Age of Onset  . Stroke Father   . Neuropathy Neg Hx      Prior to Admission medications   Medication Sig Start Date End Date Taking? Authorizing Provider  atorvastatin (LIPITOR) 20 MG tablet Take 20 mg by mouth every evening.    Yes  Historical Provider, MD  donepezil (ARICEPT) 10 MG tablet Take 1 tablet (10 mg total) by mouth daily. Patient taking differently: Take 10 mg by mouth at bedtime.  11/14/15  Yes Melvenia Beam, MD  hydrochlorothiazide (HYDRODIURIL) 25 MG tablet Take 25 mg by mouth daily.     Yes Historical Provider, MD  Tamsulosin HCl (FLOMAX) 0.4 MG CAPS Take 0.8 mg by mouth daily after supper.    Yes Historical  Provider, MD  warfarin (COUMADIN) 5 MG tablet TAKE AS DIRECTED BY COUMADIN CLINIC. Patient taking differently: TAKE AS DIRECTED BY COUMADIN CLINIC. take 7.5mg  by mouth once daily except on wednesdays take 5mg .. 01/21/16  Yes [name], MD    Physical Exam: Vitals:   03/11/16 [id number] 03/11/16 0834 03/11/16 1043  BP: 127/62  119/67  Pulse: 98  76  Resp: 20  22  Temp: 98.5 F (36.9 C)    TempSrc: Oral    SpO2: 94%  94%  Weight:  124.7 kg (275 lb)   Height:  6\' 7"  (2.007 m)       Constitutional: Deconditioned and ill-looking appearing Vitals:   03/11/16 0833 03/11/16 0834 03/11/16 1043  BP: 127/62  119/67  Pulse: 98  76  Resp: 20  22  Temp: 98.5 F (36.9 C)    TempSrc: Oral    SpO2: 94%  94%  Weight:  124.7 kg (275 lb)   Height:  6\' 7"  (2.007 m)    Eyes: PERRL, lids normal, mild conjunctival pallor but no icterus Nose and ears no deformities ENMT: Mucous membranes are dry. Posterior pharynx clear of any exudate or lesions.Normal dentition.  Neck: normal, supple, no masses, no thyromegaly Respiratory: clear to auscultation bilaterally, no wheezing, no crackles. Normal respiratory effort. No accessory muscle use. Mild decreased breath sounds at bases Cardiovascular: Irregularly irregular, S1-S2 no murmurs / rubs / gallops. No extremity edema. 2+ pedal pulses. No carotid bruits.  Abdomen: no tenderness, no masses palpated. No hepatosplenomegaly. Bowel sounds positive.  Musculoskeletal: no clubbing / cyanosis. No joint deformity upper and lower extremities. Good ROM, no contractures. Normal muscle tone.  Skin: no rashes, lesions, ulcers. No induration Neurologic: CN 2-12 grossly intact. Sensation intact, DTR normal. Strength 5/5 in all 4.    Labs on Admission: I have personally reviewed following labs and imaging studies  CBC:  Recent Labs Lab 03/11/16 0928  WBC 4.0  NEUTROABS 3.9  HGB 15.7  HCT 45.5  MCV 90.3  PLT 85*   Basic Metabolic Panel:  Recent Labs Lab  03/11/16 0928  NA 140  K 3.0*  CL 107  CO2 23  GLUCOSE 148*  BUN 16  CREATININE 1.34*  CALCIUM 9.6   GFR: Estimated Creatinine Clearance: 64.9 mL/min (by C-G formula based on SCr of 1.34 mg/dL). Liver Function Tests:  Recent Labs Lab 03/11/16 0928  AST 28  ALT 18  ALKPHOS 68  BILITOT 1.7*  PROT 6.0*  ALBUMIN 3.7   No results for input(s): LIPASE, AMYLASE in the last 168 hours. No results for input(s): AMMONIA in the last 168 hours. Coagulation Profile:  Recent Labs Lab 03/06/16 03/11/16 0928  INR 2.9* 3.24   Cardiac Enzymes: No results for input(s): CKTOTAL, CKMB, CKMBINDEX, TROPONINI in the last 168 hours. BNP (last 3 results) No results for input(s): PROBNP in the last 8760 hours. HbA1C: No results for input(s): HGBA1C in the last 72 hours. CBG: No results for input(s): GLUCAP in the last 168 hours. Lipid Profile: No results for input(s): CHOL,  HDL, LDLCALC, TRIG, CHOLHDL, LDLDIRECT in the last 72 hours. Thyroid Function Tests: No results for input(s): TSH, T4TOTAL, FREET4, T3FREE, THYROIDAB in the last 72 hours. Anemia Panel: No results for input(s): VITAMINB12, FOLATE, FERRITIN, TIBC, IRON, RETICCTPCT in the last 72 hours. Urine analysis:    Component Value Date/Time   COLORURINE RED (A) 03/11/2016 1010   APPEARANCEUR CLOUDY (A) 03/11/2016 1010   LABSPEC 1.017 03/11/2016 1010   PHURINE 5.5 03/11/2016 1010   GLUCOSEU NEGATIVE 03/11/2016 1010   HGBUR LARGE (A) 03/11/2016 1010   BILIRUBINUR SMALL (A) 03/11/2016 1010   KETONESUR NEGATIVE 03/11/2016 1010   PROTEINUR 100 (A) 03/11/2016 1010   NITRITE POSITIVE (A) 03/11/2016 1010   LEUKOCYTESUR SMALL (A) 03/11/2016 1010   Sepsis Labs: !!!!!!!!!!!!!!!!!!!!!!!!!!!!!!!!!!!!!!!!!!!! @LABRCNTIP (procalcitonin:4,lacticidven:4) )No results found for this or any previous visit (from the past 240 hour(s)).   Radiological Exams on Admission: Dg Chest 2 View  Result Date: 03/11/2016 CLINICAL DATA:  Nausea and  vomiting, possible hematemesis, also cough congestion and shortness of breath. History of atrial fibrillation, hypertension, remote history of smoking. EXAM: CHEST  2 VIEW COMPARISON:  Report of a chest x-ray dated February 03, 2007. FINDINGS: The lungs are adequately inflated. There is no focal infiltrate. There is no pleural effusion. The heart is top-normal in size. The central pulmonary vascularity is mildly prominent. There is calcification in the wall of the aortic arch. There is no mediastinal or hilar lymphadenopathy. There is multilevel degenerative disc disease of the thoracic spine. IMPRESSION: Top-normal cardiac size with mild central pulmonary vascular prominence suggests low-grade compensated CHF. There is no interstitial edema. There is no pneumonia nor pleural effusion. Electronically Signed   By: David  Martinique M.D.   On: 03/11/2016 09:29    EKG: Independently reviewed. Atrial fibrillation rhythm, rate of 84 bpm, left axis deviation with left anterior fascicular block, poor R-wave progression in the precordial leads.  Chest film. AP and lateral films personally reviewed, hypoinflation, no effusions, infiltrates or pneumothorax. Mild cardiomegaly.   Assessment/Plan Active Problems:   Sepsis [facility])   This is a 82 year old male who has history of BPH and atrial fibrillation who presents to hospital with chief complaint of generalized malaise and nausea. Prior to presentation he had about 2 weeks of back pain and dysuria. Over last 5 years he self catheterizes at night. On initial physical examination his blood pressure 133/75, heart rate 75 to 101 bpm, respiratory rate 27, oxygen saturation 95%. Conjunctivae mild pale, his oral mucosa is dry, his lungs are clear to auscultation, heart S1-S2 present irregularly irregular, his abdomen soft and nontender, lower extremities no edema. He is alert and oriented. Serum sodium is 140, potassium is 3.0, BUN is 16, creatinine 1.34, serum bicarbonate 23,  glucose 148. His lactic acid 4.69, white count 4.0, Hb 15.7, hematocrit 45.5, platelet count 85. INR 3.24. Urine analysis positive for infection with 6-30 white cells, positive nitrates, positive RBCs.   Working diagnosis. Sepsis due to urinary tract infection probably related to urinary retention, complicated by hypokalemia, acute kidney injury and supratherapeutic INR plus thrombocytopenia.   1. Sepsis. Patient is allergic to penicillins, will start patient on levofloxacin intravenously, follow-up on blood cultures, cell count and temperature course. Patient will be hydrated with normal saline at 100 ml per hour. Will follow-up lactic acid. Old records personally reviewed echocardiogram from 2016 showed normal systolic LV function. Thrombocytopenia seems to be reactive to sepsis syndrome. Follow-up cell count in the morning.  2. Urinary tract infection. Patient has urinary retention  suspected to be related to BPH, at home he self catheterizes at night. Will do ultrasonography of the kidneys to assess the severity of the obstructive uropathy. Patient had a Foley catheter in place in the emergency department. Patient follows regularly with the urologist as outpatient. There is no old urinary cultures in the system.  3. Atrophic fibrillation. Seems to be chronic and permanent, patient not on any AV blocking agent. Will continue telemetry monitoring.  Elevated INR up to 3.24, will hold warfarin, close follow-up of INR, high risk of drug- drug interaction between warfarin and fluoroquinolones.  4. AKI and hypokalemia. Will continue hydration with dextrose and isotonic saline at 100 mL per hour, suspected to be prerenal related to his sepsis syndrome, will follow-up kidney function in the morning. Avoid hypotension and nephrotoxic agents. Serum bicarbonate is 23 with no anion gap metabolic acidosis. Cautious repletion of potassium, due to decreased GFR..   5. Dementia. Since to be mild, continue donepezil 10  mg daily.  6. Dyslipidemia. Continue atorvastatin 20 g daily.  Patient is to high risk of developing worsening sepsis.   DVT prophylaxis: on warfarin Code Status: Full Family Communication: I spoke with patient's wife at the bedside and all questions were addressed. Key information for patient's care was obtained.  Disposition Plan: Home  Consults called: none  Admission status: Inpatient.     Mauricio Gerome Apley MD [facility] Pager [phone]  If 7PM-7AM, please contact night-coverage [email or web address] Password [email or web address]  03/11/2016, 11:30 AM

## 2016-03-11 NOTE — Progress Notes (Signed)
This patient has a DNR, and MOST form dated 11/08/2015 that came with him from [facility]. Dr. Cathlean Sauer notified and spoke with his wife by phone. Patient is a Full Code as it stands now. He plans to come to the bedside to discuss his current condition and clarify code status with his wife.

## 2016-03-11 NOTE — ED Notes (Signed)
When giving report to 4th floor Jerica RN, 2nd lactic acid came back at 5 higher than first one, wants to make MD aware because they aren't supposed to take lactic acid over 4 on the floor.

## 2016-03-11 NOTE — ED Notes (Signed)
Made Dr Cathlean Sauer aware that lactic acid increased since first draw.  Verbally ordered to change bed to step down.

## 2016-03-11 NOTE — ED Notes (Signed)
Spoke with charge Pam RN, patient can go up in 20 mins. She will be changing bed assignment

## 2016-03-12 LAB — COMPREHENSIVE METABOLIC PANEL
ALBUMIN: 3 g/dL — AB (ref 3.5–5.0)
ALT: 17 U/L (ref 17–63)
AST: 31 U/L (ref 15–41)
Alkaline Phosphatase: 50 U/L (ref 38–126)
Anion gap: 6 (ref 5–15)
BUN: 19 mg/dL (ref 6–20)
CHLORIDE: 106 mmol/L (ref 101–111)
CO2: 22 mmol/L (ref 22–32)
CREATININE: 1.13 mg/dL (ref 0.61–1.24)
Calcium: 8.3 mg/dL — ABNORMAL LOW (ref 8.9–10.3)
GFR calc Af Amer: >60 mL/min (ref >60)
GFR calc non Af Amer: 59 mL/min — ABNORMAL LOW (ref >60)
GLUCOSE: 161 mg/dL — AB (ref 65–99)
Potassium: 3.5 mmol/L (ref 3.5–5.1)
SODIUM: 134 mmol/L — AB (ref 135–145)
Total Bilirubin: 1.6 mg/dL — ABNORMAL HIGH (ref 0.3–1.2)
Total Protein: 5.2 g/dL — ABNORMAL LOW (ref 6.5–8.1)

## 2016-03-12 LAB — CBC
HCT: 37.5 % — ABNORMAL LOW (ref 39.0–52.0)
Hemoglobin: 13 g/dL (ref 13.0–17.0)
MCH: 31 pg (ref 26.0–34.0)
MCHC: 34.7 g/dL (ref 30.0–36.0)
MCV: 89.3 fL (ref 78.0–100.0)
PLATELETS: 72 10*3/uL — AB (ref 150–400)
RBC: 4.2 MIL/uL — AB (ref 4.22–5.81)
RDW: 14.9 % (ref 11.5–15.5)
WBC: 6.8 10*3/uL (ref 4.0–10.5)

## 2016-03-12 LAB — LACTIC ACID, PLASMA
Lactic Acid, Venous: 1.6 mmol/L (ref 0.5–1.9)
Lactic Acid, Venous: 2 mmol/L (ref 0.5–1.9)
Lactic Acid, Venous: 3 mmol/L (ref 0.5–1.9)

## 2016-03-12 LAB — PROTIME-INR
INR: 2.43
PROTHROMBIN TIME: 26.9 s — AB (ref 11.4–15.2)

## 2016-03-12 MED ORDER — LEVOFLOXACIN IN D5W 500 MG/100ML IV SOLN
500.0000 mg | INTRAVENOUS | Status: DC
  Administered 2016-03-13: 500 mg via INTRAVENOUS
  Filled 2016-03-12: qty 100

## 2016-03-12 MED ORDER — WARFARIN SODIUM 2.5 MG PO TABS
2.5000 mg | ORAL_TABLET | Freq: Once | ORAL | Status: AC
  Administered 2016-03-12: 2.5 mg via ORAL
  Filled 2016-03-12: qty 1

## 2016-03-12 MED ORDER — WARFARIN - PHARMACIST DOSING INPATIENT: Freq: Every day | Status: DC

## 2016-03-12 NOTE — Progress Notes (Signed)
Dr. Oletta Darter [facility]) made aware of pt's lactic acid of 3.0. No interventions, per MD, at this moment. VWilliams,rn.

## 2016-03-12 NOTE — Progress Notes (Signed)
Yellow colored ring removed from patients finger and given to wife [name] Andres(wife)

## 2016-03-12 NOTE — Progress Notes (Addendum)
PROGRESS NOTE    Peter Singh  [id number] DOB: 1931/09/25 DOA: 03/11/2016 PCP: Precious Reel, MD   Outpatient Specialists:     Brief Narrative:  SELMER HERRE is a 82 y.o. male with medical history significant of atrial fibrillation and urinary retention related to BPH who presents to the hospital with the chief complaint of nausea and generalized malaise. For last 2 weeks patient has been not feeling well, occasional chills, generalized weakness, moderate lumbar back pain and intermittent dysuria. This morning around 4:30 in the morning he felt acutely ill, positive nausea and more severe weakness. The weakness was persistent, no improving or worsening factors, associated with nausea. Patient was evaluated by staff at the assisted living, due to patient's overall condition EMS was called and patient was brought to the hospital for further evaluation. Patient ambulates dependently, occasionally he uses a walker, he has mild dementia, and takes Flomax for BPH, for last 4-5 years he self catheterizes at night. He has had urinary tract infections in the past but not recently.   Assessment & Plan:   Active Problems:   Sepsis ([place])   Sepsis secondary to gram neg rod UTI -blood cultures pending -on levaquin-- will need to watch INR -self caths at night--now with foley-- will need urology follow up  Atrial fib -coumadin per pharmacy  AKI -resolved  Dementia -appears to be at baseline- mild  HLD -atorvastatin  Leg/back pain-- neuropathy of unknown etiology-- ? alcohol -pain shooting down leg -long standing -had tumor removed at [facility] -had nerve conduction study with Dr. Jaynee Eagles  DVT prophylaxis:  Fully anticoagulated  Code Status: DNR -discussed with patient and wife  Family Communication: Wife at bedside  Disposition Plan:  In SDU currently-- tx to med surge this PM if still doing well   Consultants:     Procedures:        Subjective: Feeling  better  Objective: Vitals:   03/12/16 0200 03/12/16 0300 03/12/16 0800 03/12/16 1200  BP: 132/71 (!) 135/54    Pulse:      Resp: (!) 24 (!) 23    Temp:   98.3 F (36.8 C) 98.3 F (36.8 C)  TempSrc:   Oral Oral  SpO2: 96% 97%    Weight:      Height:        Intake/Output Summary (Last 24 hours) at 03/12/16 1211 Last data filed at 03/12/16 0600  Gross per 24 hour  Intake             3400 ml  Output              900 ml  Net             2500 ml   Filed Weights   03/11/16 0834 03/11/16 1510  Weight: 124.7 kg (275 lb) 121.8 kg (268 lb 8.3 oz)    Examination:  General exam: Appears calm and comfortable  Respiratory system: Clear to auscultation. Respiratory effort normal. Cardiovascular system: IRR. No JVD, murmurs, rubs, gallops or clicks. No pedal edema. Gastrointestinal system: Abdomen is nondistended, soft and nontender. No organomegaly or masses felt. Normal bowel sounds heard. Central nervous system: Alert    Data Reviewed: I have personally reviewed following labs and imaging studies  CBC:  Recent Labs Lab 03/11/16 0928 03/12/16 1025  WBC 4.0 6.8  NEUTROABS 3.9  --   HGB 15.7 13.0  HCT 45.5 37.5*  MCV 90.3 89.3  PLT 85* 72*   Basic Metabolic Panel:  Recent  Labs Lab 03/11/16 0928 03/12/16 1025  NA 140 134*  K 3.0* 3.5  CL 107 106  CO2 23 22  GLUCOSE 148* 161*  BUN 16 19  CREATININE 1.34* 1.13  CALCIUM 9.6 8.3*   GFR: Estimated Creatinine Clearance: 76.1 mL/min (by C-G formula based on SCr of 1.13 mg/dL). Liver Function Tests:  Recent Labs Lab 03/11/16 0928 03/12/16 1025  AST 28 31  ALT 18 17  ALKPHOS 68 50  BILITOT 1.7* 1.6*  PROT 6.0* 5.2*  ALBUMIN 3.7 3.0*   No results for input(s): LIPASE, AMYLASE in the last 168 hours. No results for input(s): AMMONIA in the last 168 hours. Coagulation Profile:  Recent Labs Lab 03/06/16 03/11/16 0928 03/12/16 1142  INR 2.9* 3.24 2.43   Cardiac Enzymes: No results for input(s): CKTOTAL,  CKMB, CKMBINDEX, TROPONINI in the last 168 hours. BNP (last 3 results) No results for input(s): PROBNP in the last 8760 hours. HbA1C: No results for input(s): HGBA1C in the last 72 hours. CBG: No results for input(s): GLUCAP in the last 168 hours. Lipid Profile: No results for input(s): CHOL, HDL, LDLCALC, TRIG, CHOLHDL, LDLDIRECT in the last 72 hours. Thyroid Function Tests: No results for input(s): TSH, T4TOTAL, FREET4, T3FREE, THYROIDAB in the last 72 hours. Anemia Panel: No results for input(s): VITAMINB12, FOLATE, FERRITIN, TIBC, IRON, RETICCTPCT in the last 72 hours. Urine analysis:    Component Value Date/Time   COLORURINE RED (A) 03/11/2016 1010   APPEARANCEUR CLOUDY (A) 03/11/2016 1010   LABSPEC 1.017 03/11/2016 1010   PHURINE 5.5 03/11/2016 1010   GLUCOSEU NEGATIVE 03/11/2016 1010   HGBUR LARGE (A) 03/11/2016 1010   BILIRUBINUR SMALL (A) 03/11/2016 1010   KETONESUR NEGATIVE 03/11/2016 1010   PROTEINUR 100 (A) 03/11/2016 1010   NITRITE POSITIVE (A) 03/11/2016 1010   LEUKOCYTESUR SMALL (A) 03/11/2016 1010     Recent Results (from the past 240 hour(s))  Urine culture     Status: Abnormal (Preliminary result)   Collection Time: 03/11/16 10:10 AM  Result Value Ref Range Status   Specimen Description URINE, CATHETERIZED  Final   Special Requests NONE  Final   Culture >=100,000 COLONIES/mL GRAM NEGATIVE RODS (A)  Final   Report Status PENDING  Incomplete  MRSA PCR Screening     Status: None   Collection Time: 03/11/16  3:20 PM  Result Value Ref Range Status   MRSA by PCR NEGATIVE NEGATIVE Final    Comment:        The GeneXpert MRSA Assay (FDA approved for NASAL specimens only), is one component of a comprehensive MRSA colonization surveillance program. It is not intended to diagnose MRSA infection nor to guide or monitor treatment for MRSA infections.       Anti-infectives    Start     Dose/Rate Route Frequency Ordered Stop   03/13/16 1000  levofloxacin  (LEVAQUIN) IVPB 500 mg     500 mg 100 mL/hr over 60 Minutes Intravenous Every 24 hours 03/12/16 1128     03/12/16 1000  levofloxacin (LEVAQUIN) IVPB 750 mg  Status:  Discontinued     750 mg 100 mL/hr over 90 Minutes Intravenous Every 24 hours 03/11/16 1341 03/12/16 1128   03/11/16 1000  levofloxacin (LEVAQUIN) IVPB 750 mg     750 mg 100 mL/hr over 90 Minutes Intravenous  Once 03/11/16 0949 03/11/16 1216   03/11/16 1000  aztreonam (AZACTAM) 2 g in dextrose 5 % 50 mL IVPB     2 g 100 mL/hr over 30  Minutes Intravenous  Once 03/11/16 [id number] 03/11/16 1120       Radiology Studies: Dg Chest 2 View  Result Date: 03/11/2016 CLINICAL DATA:  Nausea and vomiting, possible hematemesis, also cough congestion and shortness of breath. History of atrial fibrillation, hypertension, remote history of smoking. EXAM: CHEST  2 VIEW COMPARISON:  Report of a chest x-ray dated February 03, 2007. FINDINGS: The lungs are adequately inflated. There is no focal infiltrate. There is no pleural effusion. The heart is top-normal in size. The central pulmonary vascularity is mildly prominent. There is calcification in the wall of the aortic arch. There is no mediastinal or hilar lymphadenopathy. There is multilevel degenerative disc disease of the thoracic spine. IMPRESSION: Top-normal cardiac size with mild central pulmonary vascular prominence suggests low-grade compensated CHF. There is no interstitial edema. There is no pneumonia nor pleural effusion. Electronically Signed   By: David  Martinique M.D.   On: 03/11/2016 09:29   US Renal  Result Date: 03/11/2016 CLINICAL DATA:  UTI EXAM: RENAL / URINARY TRACT ULTRASOUND COMPLETE COMPARISON:  03/11/2016 FINDINGS: Right Kidney: Length: 12.5 cm. There is a 1.8 cm echogenic lesion arising from the upper pole of the left kidney similar to that seen on recent CT. Left Kidney: Length: 12.8 cm. Echogenicity within normal limits. No mass or hydronephrosis visualized. Bladder: Decompressed by  Foley catheter. IMPRESSION: No obstructive changes are noted. Indeterminate lesion in the upper pole of the right kidney similar to that seen on recent CT examination. Nonemergent outpatient MRI is recommended for further evaluation for improved imaging technique. Electronically Signed   By: Inez Catalina M.D.   On: 03/11/2016 16:20   Ct Renal Stone Study  Result Date: 03/11/2016 CLINICAL DATA:  Nausea and generalized malaise for the last 2 weeks. EXAM: CT ABDOMEN AND PELVIS WITHOUT CONTRAST TECHNIQUE: Multidetector CT imaging of the abdomen and pelvis was performed following the standard protocol without IV contrast. COMPARISON:  None FINDINGS: Lower chest: Mild interstitial thickening identified in the lung bases. Hepatobiliary: No mass visualized on this un-enhanced exam. Pancreas: No mass or inflammatory process identified on this un-enhanced exam. Spleen: Within normal limits in size. Adrenals/Urinary Tract: Normal appearance of the adrenal glands. Bilateral and symmetric perinephric fat stranding noted, nonspecific.Exophytic lesion arising from the upper pole of the right kidney measures 1.8 cm and 24 HU. This is incompletely characterized without IV contrast. Tiny nonobstructing left renal calculus measures 3 mm, image 32 of series 2. The urinary bladder is collapsed around a Foley catheter balloon. Stomach/Bowel: Stomach is normal. Within horizontal portion of the duodenum there is a 1.5 cm lipoma, image 41 of series 2. They pathologic dilatation of the large or small bowel loops identified. Vascular/Lymphatic: Calcified atherosclerotic disease involves the abdominal aorta. No aneurysm. No enlarged retroperitoneal or mesenteric adenopathy. No enlarged pelvic or inguinal lymph nodes. Reproductive: Prostate gland enlargement is identified. Other: No significant free fluid or fluid collections identified. Musculoskeletal: There is multi level degenerative disc disease throughout the lumbar spine. IMPRESSION:  1. No acute findings identified within the abdomen or pelvis. 2. Nonobstructing left renal calculus. 3. Indeterminate, intermediate attenuating structure arising from the upper pole of right kidney. This could represent a hyperdense cyst or solid renal mass. Further investigation with nonemergent renal mass protocol CT or MRI may be considered. 4. Aortic atherosclerosis Electronically Signed   By: Kerby Moors M.D.   On: 03/11/2016 14:59        Scheduled Meds: . atorvastatin  20 mg Oral QPM  . donepezil  10 mg Oral QHS  . [START ON 03/13/2016] levofloxacin (LEVAQUIN) IV  500 mg Intravenous Q24H  . sodium chloride flush  3 mL Intravenous Q12H  . tamsulosin  0.8 mg Oral QPC supper   Continuous Infusions: . dextrose 5 % and 0.9% NaCl 100 mL/hr at 03/11/16 1530     LOS: 1 day    Time spent: 35 min    [place], DO [facility] Pager [phone]  If 7PM-7AM, please contact night-coverage [email or web address] Password [email or web address] 03/12/2016, 12:11 PM

## 2016-03-12 NOTE — Clinical Social Work Note (Signed)
Clinical Social Work Assessment  Patient Details  Name: Peter Singh MRN: [id number] Date of Birth: 12-01-1931  Date of referral:  03/12/16               Reason for consult:  Discharge Planning                Permission sought to share information with:  Facility Art therapist granted to share information::  Yes, Verbal Permission Granted  Name::        Agency::     Relationship::     Contact Information:     Housing/Transportation Living arrangements for the past 2 months:  [place] of Information:  Patient, Spouse Patient Interpreter Needed:  None Criminal Activity/Legal Involvement Pertinent to Current Situation/Hospitalization:  No - Comment as needed Significant Relationships:  Spouse Lives with:  Spouse Do you feel safe going back to the place where you live?  Yes Need for family participation in patient care:  Yes (Comment)  Care giving concerns:  Spouse is concerned pt may need more help than is available at independent living apt.   Social Worker assessment / plan:  Pt hospitalized on 03/11/16 from [facility] with Sepsis. CSW met with pt / spouse to assist with d/c planning. Pt plans to return to [facility] at d/c. PT eval is pending. CSW will assist with d/c planning to [place] unit at [facility] if this is recommended. Pt / spouse are in agreement with this plan. CSW will continue to follow to assist with d/c planning needs.  Employment status:  Retired Forensic scientist:  Medicare PT Recommendations:  Not assessed at this time Echo / Referral to community resources:  [place]  Patient/Family's Response to care:  Disposition to be determined ( Independent Living vs ST Rehab unit )  Patient/Family's Understanding of and Emotional Response to Diagnosis, Current Treatment, and Prognosis:  Pt /spouse are aware of pt's medical status. Spouse reports that pt may  transfer from ICU/SDU today / tomorrow. Pt reports that he is feeling better and looking forward to returning to [facility].  Emotional Assessment Appearance:  Appears stated age Attitude/Demeanor/Rapport:   (cooperative) Affect (typically observed):  Appropriate, Calm, Pleasant Orientation:  Oriented to Self, Oriented to Place, Oriented to  Time, Oriented to Situation Alcohol / Substance use:  Not Applicable Psych involvement (Current and /or in the community):  No (Comment)  Discharge Needs  Concerns to be addressed:  Discharge Planning Concerns Readmission within the last 30 days:  No Current discharge risk:  None Barriers to Discharge:  No Barriers Identified   Luretha Rued, [place] 03/12/2016, 2:09 PM

## 2016-03-12 NOTE — Progress Notes (Signed)
   03/12/16 1200  Clinical Encounter Type  Visited With Patient and family together  Visit Type Initial;Psychological support;Spiritual support;Critical Care  Referral From Nurse  Consult/Referral To Chaplain  Spiritual Encounters  Spiritual Needs Emotional;Other (Comment) (Pastoral Conversation/Support)  Stress Factors  Patient Stress Factors Exhausted  Family Stress Factors None identified   I visited with the patient per referral by the nurse. I was told that the patient had just been made a DNR and could use support.  When I arrived, the patient was asleep and the patient's wife was sitting at the bedside.  The patient briefly awoke. They were appreciative of Spiritual Support and were open to a visit at a more convenient time.   I will follow-up with the patient.   [place] M.Div.

## 2016-03-12 NOTE — Progress Notes (Signed)
Pharmacy Antibiotic Follow-up Note  Peter Singh is a 82 y.o. year-old male admitted on 03/11/2016 with complaint of N/V, fatigue, hematuria but denies urinary symptoms. The patient is currently on day 2 of Levaquin 750mg  IV q24h for UTI. CXR negative for PNA. Hx of self-catheterization.  Assessment/Plan:  Decrease levaquin to 500mg  IV q24hr for UTI indication  Follow up renal function & cultures (GNR in urine)  INR ordered - f/u for appropriateness of warfarin resumption  Temp (24hrs), Avg:99.2 F (37.3 C), Min:98.3 F (36.8 C), Max:100.1 F (37.8 C)   Recent Labs Lab 03/11/16 0928 03/12/16 1025  WBC 4.0 6.8     Recent Labs Lab 03/11/16 0928 03/12/16 1025  CREATININE 1.34* 1.13   Estimated Creatinine Clearance: 76.1 mL/min (by C-G formula based on SCr of 1.13 mg/dL).    Allergies  Allergen Reactions  . Penicillins Rash    Has patient had a PCN reaction causing immediate rash, facial/tongue/throat swelling, SOB or lightheadedness with hypotension: unknown Has patient had a PCN reaction causing severe rash involving mucus membranes or skin necrosis: unknown Has patient had a PCN reaction that required hospitalization : unknown Has patient had a PCN reaction occurring within the last 10 years: no If all of the above answers are "NO", then may proceed with Cephalosporin use.    Antimicrobials this admission: 9/5 Aztreonam 2gm x1 in ED 9/5 Levaquin >>  Levels/dose changes this admission: 9/6: decrease levaquin from 750mg  to 500mg  q24h for indication  Microbiology results: 9/5 BCx: >100k GNR 9/5 UCx: sent 9/5 MRSA PCR (-)  Thank you for allowing pharmacy to be a part of this patient's care.  Peggyann Juba, PharmD, BCPS Pager: [phone] 03/12/2016, 11:28 AM

## 2016-03-12 NOTE — Progress Notes (Signed)
ANTICOAGULATION CONSULT NOTE - Initial Consult  Pharmacy Consult for Warfarin Indication: atrial fibrillation  Allergies  Allergen Reactions  . Penicillins Rash    Has patient had a PCN reaction causing immediate rash, facial/tongue/throat swelling, SOB or lightheadedness with hypotension: unknown Has patient had a PCN reaction causing severe rash involving mucus membranes or skin necrosis: unknown Has patient had a PCN reaction that required hospitalization : unknown Has patient had a PCN reaction occurring within the last 10 years: no If all of the above answers are "NO", then may proceed with Cephalosporin use.     Patient Measurements: Height: 6\' 7"  (200.7 cm) Weight: 268 lb 8.3 oz (121.8 kg) IBW/kg (Calculated) : 93.7  Vital Signs: Temp: 98.3 F (36.8 C) (09/06 1200) Temp Source: Oral (09/06 1200) BP: 135/54 (09/06 0300)  Labs:  Recent Labs  03/11/16 0928 03/12/16 1025 03/12/16 1142  HGB 15.7 13.0  --   HCT 45.5 37.5*  --   PLT 85* 72*  --   LABPROT 33.8*  --  26.9*  INR 3.24  --  2.43  CREATININE 1.34* 1.13  --     Estimated Creatinine Clearance: 76.1 mL/min (by C-G formula based on SCr of 1.13 mg/dL).   Medical History: Past Medical History:  Diagnosis Date  . Atrial fibrillation ([place])   . Diverticulosis of colon (without mention of hemorrhage)   . Hyperlipemia   . Hypertension   . Melanoma ([place])   . Personal history of colonic polyps 1999 & 2004   adenomatous polyps  . Ventricular hypertrophy     Medications:  Scheduled:  . atorvastatin  20 mg Oral QPM  . donepezil  10 mg Oral QHS  . [START ON 03/13/2016] levofloxacin (LEVAQUIN) IV  500 mg Intravenous Q24H  . sodium chloride flush  3 mL Intravenous Q12H  . tamsulosin  0.8 mg Oral QPC supper   Infusions:  . dextrose 5 % and 0.9% NaCl 100 mL/hr at 03/11/16 1530   PRN: acetaminophen **OR** acetaminophen, ondansetron **OR** ondansetron (ZOFRAN) IV  Assessment: 82 yo male on chronic warfarin  for Afib.  At most recent clinic visit 8/31: INR 2.9, home dose 7.5mg  daily exc 5mg  Wed with last dose 9/4.  INR supratherapeutic on admission (3.24), now improved to 2.43 today. Pharmacy consulted to continue dosing while inpatient.  Of note, platelets low. No baseline values in Epic for comparison. MD attributes thrombocytopenia to be reactive to sepsis syndrome.  Goal of Therapy:  INR 2-3 Monitor platelets by anticoagulation protocol: Yes   Plan:   Warfarin 2.5mg  PO x 1 at 1800 - low dose given interaction with levaquin, acute illness and expected decrease in vitamin K intake  Daily PT/INR  Monitor closely for signs/symptoms of bleeding or thrombosis  Peggyann Juba, PharmD, BCPS Pager: [phone] 03/12/2016,12:59 PM

## 2016-03-13 DIAGNOSIS — R319 Hematuria, unspecified: Secondary | ICD-10-CM

## 2016-03-13 DIAGNOSIS — D696 Thrombocytopenia, unspecified: Secondary | ICD-10-CM

## 2016-03-13 LAB — BASIC METABOLIC PANEL
ANION GAP: 6 (ref 5–15)
BUN: 15 mg/dL (ref 6–20)
CALCIUM: 8.8 mg/dL — AB (ref 8.9–10.3)
CO2: 24 mmol/L (ref 22–32)
Chloride: 106 mmol/L (ref 101–111)
Creatinine, Ser: 0.88 mg/dL (ref 0.61–1.24)
Glucose, Bld: 125 mg/dL — ABNORMAL HIGH (ref 65–99)
Potassium: 3.4 mmol/L — ABNORMAL LOW (ref 3.5–5.1)
Sodium: 136 mmol/L (ref 135–145)

## 2016-03-13 LAB — URINE CULTURE: Culture: >=100000 — AB

## 2016-03-13 LAB — CBC
HEMATOCRIT: 38.1 % — AB (ref 39.0–52.0)
Hemoglobin: 13.1 g/dL (ref 13.0–17.0)
MCH: 30.7 pg (ref 26.0–34.0)
MCHC: 34.4 g/dL (ref 30.0–36.0)
MCV: 89.2 fL (ref 78.0–100.0)
Platelets: 64 10*3/uL — ABNORMAL LOW (ref 150–400)
RBC: 4.27 MIL/uL (ref 4.22–5.81)
RDW: 15 % (ref 11.5–15.5)
WBC: 4.9 10*3/uL (ref 4.0–10.5)

## 2016-03-13 LAB — PROTIME-INR
INR: 1.91
PROTHROMBIN TIME: 22.1 s — AB (ref 11.4–15.2)

## 2016-03-13 MED ORDER — CEPHALEXIN 500 MG PO CAPS
500.0000 mg | ORAL_CAPSULE | Freq: Two times a day (BID) | ORAL | Status: DC
  Administered 2016-03-13 – 2016-03-14 (×3): 500 mg via ORAL
  Filled 2016-03-13 (×3): qty 1

## 2016-03-13 MED ORDER — POTASSIUM CHLORIDE CRYS ER 20 MEQ PO TBCR
40.0000 meq | EXTENDED_RELEASE_TABLET | Freq: Once | ORAL | Status: AC
  Administered 2016-03-13: 40 meq via ORAL
  Filled 2016-03-13: qty 2

## 2016-03-13 MED ORDER — WARFARIN SODIUM 5 MG PO TABS
5.0000 mg | ORAL_TABLET | Freq: Once | ORAL | Status: AC
  Administered 2016-03-13: 5 mg via ORAL
  Filled 2016-03-13: qty 1

## 2016-03-13 MED ORDER — VITAMIN B-1 100 MG PO TABS
100.0000 mg | ORAL_TABLET | Freq: Every day | ORAL | Status: DC
  Administered 2016-03-13 – 2016-03-14 (×2): 100 mg via ORAL
  Filled 2016-03-13 (×2): qty 1

## 2016-03-13 MED ORDER — FOLIC ACID 1 MG PO TABS
1.0000 mg | ORAL_TABLET | Freq: Every day | ORAL | Status: DC
  Administered 2016-03-13 – 2016-03-14 (×2): 1 mg via ORAL
  Filled 2016-03-13 (×2): qty 1

## 2016-03-13 NOTE — NC FL2 (Signed)
[place] LEVEL OF CARE SCREENING TOOL     IDENTIFICATION  Patient Name: Peter Singh Birthdate: January 01, 1932 Sex: male Admission Date (Current Location): 03/11/2016  [facility] and [place] Number:  Herbalist and Address:  [facility],  [place] [address], [place]      Provider Number: [id number]  Attending Physician Name and Address:  Geradine Girt, DO  Relative Name and Phone Number:       Current Level of Care: Hospital Recommended Level of Care: [place] Prior Approval Number:    Date Approved/Denied:   PASRR Number:    Discharge Plan: SNF    Current Diagnoses: Patient Active Problem List   Diagnosis Date Noted  . Sepsis ([place]) 03/11/2016  . Cognitive changes 07/30/2014  . Hereditary and idiopathic peripheral neuropathy 07/06/2014  . Essential hypertension 03/24/2014  . Hyperlipidemia 03/24/2014  . Chronic anticoagulation 03/24/2014  . Atrial fibrillation ([place]) 05/17/2013    Orientation RESPIRATION BLADDER Height & Weight     Self, Time, Situation, Place    Indwelling catheter Weight: 268 lb 8.3 oz (121.8 kg) Height:  6\' 7"  (200.7 cm)  BEHAVIORAL SYMPTOMS/MOOD NEUROLOGICAL BOWEL NUTRITION STATUS  Other (Comment) (no behaviors)   Continent Diet  AMBULATORY STATUS COMMUNICATION OF NEEDS Skin   Limited Assist Verbally Normal                       Personal Care Assistance Level of Assistance  Bathing, Feeding, Dressing Bathing Assistance: Limited assistance Feeding assistance: Independent Dressing Assistance: Limited assistance     Functional Limitations Info  Sight, Hearing, Speech Sight Info: Adequate Hearing Info: Adequate Speech Info: Adequate    SPECIAL CARE FACTORS FREQUENCY  PT (By licensed PT), OT (By licensed OT)     PT Frequency: 5x wk OT Frequency: 5x wk            Contractures Contractures Info: Not present    Additional Factors Info  Code Status Code Status  Info: DNR             Current Medications (03/13/2016):  This is the current hospital active medication list Current Facility-Administered Medications  Medication Dose Route Frequency Provider Last Rate Last Dose  . acetaminophen (TYLENOL) tablet 650 mg  650 mg Oral Q6H PRN Tawni Millers, MD   650 mg at 03/11/16 1606   Or  . acetaminophen (TYLENOL) suppository 650 mg  650 mg Rectal Q6H PRN Tawni Millers, MD      . atorvastatin (LIPITOR) tablet 20 mg  20 mg Oral QPM Mauricio Gerome Apley, MD   20 mg at 03/12/16 1736  . cephALEXin (KEFLEX) capsule 500 mg  500 mg Oral Q12H Jessica U Vann, DO   500 mg at 03/13/16 1124  . donepezil (ARICEPT) tablet 10 mg  10 mg Oral QHS Tawni Millers, MD   10 mg at 03/12/16 2201  . folic acid (FOLVITE) tablet 1 mg  1 mg Oral Daily Jessica U Vann, DO      . ondansetron (ZOFRAN) tablet 4 mg  4 mg Oral Q6H PRN Mauricio Gerome Apley, MD       Or  . ondansetron [facility]) injection 4 mg  4 mg Intravenous Q6H PRN Tawni Millers, MD      . sodium chloride flush (NS) 0.9 % injection 3 mL  3 mL Intravenous Q12H Mauricio Gerome Apley, MD   3 mL at 03/13/16 1000  . tamsulosin (  FLOMAX) capsule 0.8 mg  0.8 mg Oral QPC supper Mauricio Gerome Apley, MD   0.8 mg at 03/12/16 1736  . thiamine (VITAMIN B-1) tablet 100 mg  100 mg Oral Daily Geradine Girt, DO      . warfarin (COUMADIN) tablet 5 mg  5 mg Oral ONCE-1800 Adrian Saran, [facility]      . Warfarin - Pharmacist Dosing Inpatient   Does not apply [place], [facility]         Discharge Medications: Please see discharge summary for a list of discharge medications.  Relevant Imaging Results:  Relevant Lab Results:   Additional Information SS # [id number]  [place] Fettig, Randall An, LCSW

## 2016-03-13 NOTE — Progress Notes (Signed)
ANTICOAGULATION CONSULT NOTE - Initial Consult  Pharmacy Consult for Warfarin Indication: atrial fibrillation  Allergies  Allergen Reactions  . Penicillins Rash    Has patient had a PCN reaction causing immediate rash, facial/tongue/throat swelling, SOB or lightheadedness with hypotension: unknown Has patient had a PCN reaction causing severe rash involving mucus membranes or skin necrosis: unknown Has patient had a PCN reaction that required hospitalization : unknown Has patient had a PCN reaction occurring within the last 10 years: no If all of the above answers are "NO", then may proceed with Cephalosporin use.     Patient Measurements: Height: 6\' 7"  (200.7 cm) Weight: 268 lb 8.3 oz (121.8 kg) IBW/kg (Calculated) : 93.7  Vital Signs: Temp: 97.8 F (36.6 C) (09/07 0800) Temp Source: Oral (09/07 0800) BP: 100/82 (09/07 0800) Pulse Rate: 59 (09/07 0800)  Labs:  Recent Labs  03/11/16 0928 03/12/16 1025 03/12/16 1142 03/13/16 0539  HGB 15.7 13.0  --  13.1  HCT 45.5 37.5*  --  38.1*  PLT 85* 72*  --  64*  LABPROT 33.8*  --  26.9* 22.1*  INR 3.24  --  2.43 1.91  CREATININE 1.34* 1.13  --  0.88    Estimated Creatinine Clearance: 97.7 mL/min (by C-G formula based on SCr of 0.88 mg/dL).   Medical History: Past Medical History:  Diagnosis Date  . Atrial fibrillation ([place])   . Diverticulosis of colon (without mention of hemorrhage)   . Hyperlipemia   . Hypertension   . Melanoma ([place])   . Personal history of colonic polyps 1999 & 2004   adenomatous polyps  . Ventricular hypertrophy     Medications:  Scheduled:  . atorvastatin  20 mg Oral QPM  . donepezil  10 mg Oral QHS  . levofloxacin (LEVAQUIN) IV  500 mg Intravenous Q24H  . sodium chloride flush  3 mL Intravenous Q12H  . tamsulosin  0.8 mg Oral QPC supper  . Warfarin - Pharmacist Dosing Inpatient   Does not apply q1800   Infusions:    PRN: acetaminophen **OR** acetaminophen, ondansetron **OR**  ondansetron (ZOFRAN) IV  Assessment: 82 yo male on chronic warfarin for Afib.  At most recent clinic visit 8/31: INR 2.9, home dose 7.5mg  daily exc 5mg  Wed with last dose 9/4.  INR supratherapeutic on admission (3.24), now improved to 2.43 today. Pharmacy consulted to continue dosing while inpatient.  Today, 03/13/16 - INR just below goal at 1.91 - 2.5mg  warfarin given 9/6 - Hgb stable, Plts dropping now to 64k (was 85k) -  MD attributes thrombocytopenia to be reactive to sepsis syndrome. - no reported bleeding - Plan per Md is to further narrow abx's with sensitivities of klebsiella UTI now known   Goal of Therapy:  INR 2-3 Monitor platelets by anticoagulation protocol: Yes   Plan:   Warfarin 5mg  PO x 1 at 1800   Daily PT/INR  Monitor closely for signs/symptoms of bleeding or thrombosis   Adrian Saran, PharmD, BCPS Pager [phone] 03/13/2016 9:04 AM

## 2016-03-13 NOTE — Progress Notes (Signed)
PROGRESS NOTE    Peter Singh  [id number] DOB: Aug 01, 1931 DOA: 03/11/2016 PCP: Precious Reel, MD   Outpatient Specialists:     Brief Narrative:  Peter Singh is a 82 y.o. male with medical history significant of atrial fibrillation and urinary retention related to BPH who presents to the hospital with the chief complaint of nausea and generalized malaise. For last 2 weeks patient has been not feeling well, occasional chills, generalized weakness, moderate lumbar back pain and intermittent dysuria. This morning around 4:30 in the morning he felt acutely ill, positive nausea and more severe weakness. The weakness was persistent, no improving or worsening factors, associated with nausea. Patient was evaluated by staff at the assisted living, due to patient's overall condition EMS was called and patient was brought to the hospital for further evaluation. Patient ambulates dependently, occasionally he uses a walker, he has mild dementia, and takes Flomax for BPH, for last 4-5 years he self catheterizes at night. He has had urinary tract infections in the past but not recently.   Assessment & Plan:   Active Problems:   Sepsis ([place])   Sepsis secondary to gram neg rod UTI- Klebsiella -blood cultures ngtd -change to kefltx -self caths at night--now with foley-- will need urology follow up for possible foley voiding trial  Atrial fib -coumadin per pharmacy  AKI -resolved  Dementia -appears to be at baseline- mild  HLD -atorvastatin  Leg/back pain-- neuropathy of unknown etiology-- ? alcohol -pain shooting down leg -long standing -had tumor removed at [facility] -had nerve conduction study with Dr. Jaynee Eagles  Thrombocytopenia -on coumadin -no heparin products -recheck in AM -no recent labs to compare  DVT prophylaxis:  Fully anticoagulated  Code Status: DNR -discussed with patient and wife  Family Communication: Wife at bedside 9/6  Disposition Plan:  To SNF at  [facility]   Consultants:     Procedures:        Subjective: Not eating as much as usual but overall feeling better  Objective: Vitals:   03/12/16 2054 03/13/16 0513 03/13/16 0800 03/13/16 1457  BP: 124/67 121/82 100/82 108/68  Pulse: 74 71 (!) 59 (!) 59  Resp: 20 20    Temp: 98 F (36.7 C) 97.7 F (36.5 C) 97.8 F (36.6 C) 98.5 F (36.9 C)  TempSrc: Oral Oral Oral Oral  SpO2: 97% 97%  100%  Weight:      Height:        Intake/Output Summary (Last 24 hours) at 03/13/16 1501 Last data filed at 03/13/16 1400  Gross per 24 hour  Intake              310 ml  Output             1200 ml  Net             -890 ml   Filed Weights   03/11/16 0834 03/11/16 1510  Weight: 124.7 kg (275 lb) 121.8 kg (268 lb 8.3 oz)    Examination:  General exam: Appears calm and comfortable  Respiratory system: Clear to auscultation. Respiratory effort normal. Cardiovascular system: IRR. No JVD, murmurs, rubs, gallops or clicks. No pedal edema. Gastrointestinal system: Abdomen is nondistended, soft and nontender. No organomegaly or masses felt. Normal bowel sounds heard. Central nervous system: Alert    Data Reviewed: I have personally reviewed following labs and imaging studies  CBC:  Recent Labs Lab 03/11/16 0928 03/12/16 1025 03/13/16 0539  WBC 4.0 6.8 4.9  NEUTROABS 3.9  --   --  HGB 15.7 13.0 13.1  HCT 45.5 37.5* 38.1*  MCV 90.3 89.3 89.2  PLT 85* 72* 64*   Basic Metabolic Panel:  Recent Labs Lab 03/11/16 0928 03/12/16 1025 03/13/16 0539  NA 140 134* 136  K 3.0* 3.5 3.4*  CL 107 106 106  CO2 23 22 24   GLUCOSE 148* 161* 125*  BUN 16 19 15   CREATININE 1.34* 1.13 0.88  CALCIUM 9.6 8.3* 8.8*   GFR: Estimated Creatinine Clearance: 97.7 mL/min (by C-G formula based on SCr of 0.88 mg/dL). Liver Function Tests:  Recent Labs Lab 03/11/16 0928 03/12/16 1025  AST 28 31  ALT 18 17  ALKPHOS 68 50  BILITOT 1.7* 1.6*  PROT 6.0* 5.2*  ALBUMIN 3.7 3.0*   No  results for input(s): LIPASE, AMYLASE in the last 168 hours. No results for input(s): AMMONIA in the last 168 hours. Coagulation Profile:  Recent Labs Lab 03/11/16 0928 03/12/16 1142 03/13/16 0539  INR 3.24 2.43 1.91   Cardiac Enzymes: No results for input(s): CKTOTAL, CKMB, CKMBINDEX, TROPONINI in the last 168 hours. BNP (last 3 results) No results for input(s): PROBNP in the last 8760 hours. HbA1C: No results for input(s): HGBA1C in the last 72 hours. CBG: No results for input(s): GLUCAP in the last 168 hours. Lipid Profile: No results for input(s): CHOL, HDL, LDLCALC, TRIG, CHOLHDL, LDLDIRECT in the last 72 hours. Thyroid Function Tests: No results for input(s): TSH, T4TOTAL, FREET4, T3FREE, THYROIDAB in the last 72 hours. Anemia Panel: No results for input(s): VITAMINB12, FOLATE, FERRITIN, TIBC, IRON, RETICCTPCT in the last 72 hours. Urine analysis:    Component Value Date/Time   COLORURINE RED (A) 03/11/2016 1010   APPEARANCEUR CLOUDY (A) 03/11/2016 1010   LABSPEC 1.017 03/11/2016 1010   PHURINE 5.5 03/11/2016 1010   GLUCOSEU NEGATIVE 03/11/2016 1010   HGBUR LARGE (A) 03/11/2016 1010   BILIRUBINUR SMALL (A) 03/11/2016 1010   KETONESUR NEGATIVE 03/11/2016 1010   PROTEINUR 100 (A) 03/11/2016 1010   NITRITE POSITIVE (A) 03/11/2016 1010   LEUKOCYTESUR SMALL (A) 03/11/2016 1010     Recent Results (from the past 240 hour(s))  Urine culture     Status: Abnormal   Collection Time: 03/11/16 10:10 AM  Result Value Ref Range Status   Specimen Description URINE, CATHETERIZED  Final   Special Requests NONE  Final   Culture >=100,000 COLONIES/mL KLEBSIELLA PNEUMONIAE (A)  Final   Report Status 03/13/2016 FINAL  Final   Organism ID, Bacteria KLEBSIELLA PNEUMONIAE (A)  Final      Susceptibility   Klebsiella pneumoniae - MIC*    AMPICILLIN >=32 RESISTANT Resistant     CEFAZOLIN <=4 SENSITIVE Sensitive     CEFTRIAXONE <=1 SENSITIVE Sensitive     CIPROFLOXACIN <=0.25  SENSITIVE Sensitive     GENTAMICIN <=1 SENSITIVE Sensitive     IMIPENEM <=0.25 SENSITIVE Sensitive     NITROFURANTOIN 64 INTERMEDIATE Intermediate     TRIMETH/SULFA <=20 SENSITIVE Sensitive     AMPICILLIN/SULBACTAM 4 SENSITIVE Sensitive     PIP/TAZO <=4 SENSITIVE Sensitive     Extended ESBL NEGATIVE Sensitive     * >=100,000 COLONIES/mL KLEBSIELLA PNEUMONIAE  Blood Culture (routine x 2)     Status: None (Preliminary result)   Collection Time: 03/11/16 10:15 AM  Result Value Ref Range Status   Specimen Description BLOOD RIGHT ANTECUBITAL  Final   Special Requests BOTTLES DRAWN AEROBIC AND ANAEROBIC 10ML  Final   Culture   Final    NO GROWTH 1 DAY Performed at  [facility]    Report Status PENDING  Incomplete  Blood Culture (routine x 2)     Status: None (Preliminary result)   Collection Time: 03/11/16 10:24 AM  Result Value Ref Range Status   Specimen Description BLOOD LEFT ANTECUBITAL  Final   Special Requests BOTTLES DRAWN AEROBIC AND ANAEROBIC 5ML  Final   Culture   Final    NO GROWTH 1 DAY Performed at [facility]    Report Status PENDING  Incomplete  MRSA PCR Screening     Status: None   Collection Time: 03/11/16  3:20 PM  Result Value Ref Range Status   MRSA by PCR NEGATIVE NEGATIVE Final    Comment:        The GeneXpert MRSA Assay (FDA approved for NASAL specimens only), is one component of a comprehensive MRSA colonization surveillance program. It is not intended to diagnose MRSA infection nor to guide or monitor treatment for MRSA infections.       Anti-infectives    Start     Dose/Rate Route Frequency Ordered Stop   03/13/16 1100  cephALEXin (KEFLEX) capsule 500 mg     500 mg Oral Every 12 hours 03/13/16 1014     03/13/16 1000  levofloxacin (LEVAQUIN) IVPB 500 mg  Status:  Discontinued     500 mg 100 mL/hr over 60 Minutes Intravenous Every 24 hours 03/12/16 1128 03/13/16 1014   03/12/16 1000  levofloxacin (LEVAQUIN) IVPB 750 mg  Status:   Discontinued     750 mg 100 mL/hr over 90 Minutes Intravenous Every 24 hours 03/11/16 1341 03/12/16 1128   03/11/16 1000  levofloxacin (LEVAQUIN) IVPB 750 mg     750 mg 100 mL/hr over 90 Minutes Intravenous  Once 03/11/16 0949 03/11/16 1216   03/11/16 1000  aztreonam (AZACTAM) 2 g in dextrose 5 % 50 mL IVPB     2 g 100 mL/hr over 30 Minutes Intravenous  Once 03/11/16 0949 03/11/16 1120       Radiology Studies: US Renal  Result Date: 03/11/2016 CLINICAL DATA:  UTI EXAM: RENAL / URINARY TRACT ULTRASOUND COMPLETE COMPARISON:  03/11/2016 FINDINGS: Right Kidney: Length: 12.5 cm. There is a 1.8 cm echogenic lesion arising from the upper pole of the left kidney similar to that seen on recent CT. Left Kidney: Length: 12.8 cm. Echogenicity within normal limits. No mass or hydronephrosis visualized. Bladder: Decompressed by Foley catheter. IMPRESSION: No obstructive changes are noted. Indeterminate lesion in the upper pole of the right kidney similar to that seen on recent CT examination. Nonemergent outpatient MRI is recommended for further evaluation for improved imaging technique. Electronically Signed   By: Inez Catalina M.D.   On: 03/11/2016 16:20        Scheduled Meds: . atorvastatin  20 mg Oral QPM  . cephALEXin  500 mg Oral Q12H  . donepezil  10 mg Oral QHS  . sodium chloride flush  3 mL Intravenous Q12H  . tamsulosin  0.8 mg Oral QPC supper  . warfarin  5 mg Oral ONCE-1800  . Warfarin - Pharmacist Dosing Inpatient   Does not apply q1800   Continuous Infusions:     LOS: 2 days    Time spent: 25 min    [place], DO [facility] Pager [phone]  If 7PM-7AM, please contact night-coverage [email or web address] Password [email or web address] 03/13/2016, 3:01 PM

## 2016-03-13 NOTE — Progress Notes (Signed)
CSW met with pt / spouse to assist with d/c planning. PT has recommended ST Rehab at d/c. Pt / spouse are in agreement with this plan. [facility] will have a rehab bed for pt tomorrow if pt is stable for d/c. CSW will continue to follow to assist with d/c planning to SNF.  Werner Lean LCSW [phone]

## 2016-03-13 NOTE — Evaluation (Signed)
Physical Therapy Evaluation Patient Details Name: Peter Singh MRN: [id number] DOB: 01/08/1932 Today's Date: 03/13/2016   History of Present Illness  82 yo male admitted with sepsis, UTI. Hx of A fib, BPH, dementia, HTN  Clinical Impression  On eval, pt required Min guard assist for mobility. He walked ~150 feet with a 4 wheeled walker. Pt is unsteady and was noted to rely moderately on UEs and walker for support. At baseline, he is able to ambulate independently and he was unable to attempt this on today. He demonstrates generalized weakness, impaired gait, and decreased balance. Discussed d/c plan with pt/wife-both are agreeable to a short stay in SNF rehab. Feel pt could benefit more from a ST rehab stay to maximize independence and safety with functional mobility.     Follow Up Recommendations SNF    Equipment Recommendations  None recommended by PT    Recommendations for Other Services OT consult     Precautions / Restrictions Precautions Precautions: Fall Restrictions Weight Bearing Restrictions: No      Mobility  Bed Mobility Overal bed mobility: Needs Assistance Bed Mobility: Supine to Sit;Sit to Supine     Supine to sit: HOB elevated;Min guard Sit to supine: HOB elevated;Min guard   General bed mobility comments: close guard for safety. Increased time.   Transfers Overall transfer level: Needs assistance Equipment used: 4-wheeled walker Transfers: Sit to/from Stand Sit to Stand: Min guard         General transfer comment: close guard for safety. VCs hand placement, safe operation of rollator  Ambulation/Gait Ambulation/Gait assistance: Min guard Ambulation Distance (Feet): 150 Feet Assistive device: 4-wheeled walker Gait Pattern/deviations: Step-through pattern;Decreased stride length;Decreased step length - right;Decreased step length - left     General Gait Details: close guard for safety. slow gait speed. Noted pt to rely moderately on UEs/walker.    Stairs            Wheelchair Mobility    Modified Rankin (Stroke Patients Only)       Balance Overall balance assessment: Needs assistance         Standing balance support: Bilateral upper extremity supported Standing balance-Leahy Scale: Poor Standing balance comment: requiring RW for now                             Pertinent Vitals/Pain Pain Assessment: No/denies pain    Home Living Family/patient expects to be discharged to:: Private residence Living Arrangements: Spouse/significant other Available Help at Discharge: Family Type of Home: House Home Access: Level entry     Home Layout: One level Home Equipment: Environmental consultant - 4 wheels      Prior Function Level of Independence: Independent               Hand Dominance        Extremity/Trunk Assessment   Upper Extremity Assessment: Generalized weakness           Lower Extremity Assessment: Generalized weakness;LLE deficits/detail;RLE deficits/detail      Cervical / Trunk Assessment: Normal  Communication   Communication: No difficulties  Cognition Arousal/Alertness: Awake/alert Behavior During Therapy: WFL for tasks assessed/performed Overall Cognitive Status: Within Functional Limits for tasks assessed                      General Comments      Exercises        Assessment/Plan    PT Assessment Patient needs continued PT  services  PT Diagnosis Difficulty walking;Generalized weakness   PT Problem List Decreased strength;Decreased mobility;Decreased activity tolerance;Decreased balance;Decreased knowledge of use of DME;Impaired sensation  PT Treatment Interventions DME instruction;Gait training;Functional mobility training;Therapeutic activities;Patient/family education;Therapeutic exercise;Balance training   PT Goals (Current goals can be found in the Care Plan section) Acute Rehab PT Goals Patient Stated Goal: to regain PLOF PT Goal Formulation: With  patient/family Time For Goal Achievement: 03/27/16 Potential to Achieve Goals: Good    Frequency Min 3X/week   Barriers to discharge        Co-evaluation               End of Session Equipment Utilized During Treatment: Gait belt Activity Tolerance: Patient tolerated treatment well Patient left: in bed;with call bell/phone within reach;with bed alarm set;with family/visitor present           Time: [id number] PT Time Calculation (min) (ACUTE ONLY): 20 min   Charges:   PT Evaluation $PT Eval Low Complexity: 1 Procedure     PT G Codes:        Weston Anna, MPT Pager: [phone]

## 2016-03-13 NOTE — Progress Notes (Signed)
   03/13/16 1000  Clinical Encounter Type  Visited With Patient and family together  Visit Type Follow-up;Psychological support;Spiritual support  Referral From Family  Consult/Referral To Chaplain  Spiritual Encounters  Spiritual Needs Emotional;Other (Comment) (Pastoral Conversation/Support)  Stress Factors  Patient Stress Factors Other (Comment) (Physical Therapy)  Family Stress Factors None identified   I followed-up with the patient and his wife. The patient was awake today and watching television when I arrived. He seemed to be in better spirits than the previous day. He still repeated questions twice, but remembered who I was from the previous day.  I brought the patient a prayer shawl for comfort. The patient's wife placed the shawl at the patient's feet.  During our brief conversation, neither the patient or his wife identified any stress factors. They were looking forward to physical therapy and the healing process.  Mr. Aukerman has good support from their church, [employer] .   Please, contact Spiritual Care for further assistance.   [place] M.Div.

## 2016-03-14 DIAGNOSIS — N390 Urinary tract infection, site not specified: Secondary | ICD-10-CM

## 2016-03-14 DIAGNOSIS — A419 Sepsis, unspecified organism: Principal | ICD-10-CM

## 2016-03-14 LAB — BASIC METABOLIC PANEL
Anion gap: 6 (ref 5–15)
BUN: 15 mg/dL (ref 6–20)
CALCIUM: 9.2 mg/dL (ref 8.9–10.3)
CO2: 26 mmol/L (ref 22–32)
CREATININE: 0.89 mg/dL (ref 0.61–1.24)
Chloride: 106 mmol/L (ref 101–111)
GFR calc Af Amer: >60 mL/min (ref >60)
GLUCOSE: 114 mg/dL — AB (ref 65–99)
POTASSIUM: 3.8 mmol/L (ref 3.5–5.1)
SODIUM: 138 mmol/L (ref 135–145)

## 2016-03-14 LAB — CBC WITH DIFFERENTIAL/PLATELET
Basophils Absolute: 0 10*3/uL (ref 0.0–0.1)
Basophils Relative: 0 %
EOS ABS: 0 10*3/uL (ref 0.0–0.7)
Eosinophils Relative: 1 %
HEMATOCRIT: 39.8 % (ref 39.0–52.0)
HEMOGLOBIN: 13.4 g/dL (ref 13.0–17.0)
LYMPHS ABS: 0.7 10*3/uL (ref 0.7–4.0)
Lymphocytes Relative: 21 %
MCH: 30.9 pg (ref 26.0–34.0)
MCHC: 33.7 g/dL (ref 30.0–36.0)
MCV: 91.7 fL (ref 78.0–100.0)
MONOS PCT: 5 %
Monocytes Absolute: 0.2 10*3/uL (ref 0.1–1.0)
NEUTROS PCT: 74 %
Neutro Abs: 2.4 10*3/uL (ref 1.7–7.7)
PLATELETS: 61 10*3/uL — AB (ref 150–400)
RBC: 4.34 MIL/uL (ref 4.22–5.81)
RDW: 14.9 % (ref 11.5–15.5)
WBC: 3.2 10*3/uL — ABNORMAL LOW (ref 4.0–10.5)

## 2016-03-14 LAB — PROTIME-INR
INR: 1.77
Prothrombin Time: 20.8 seconds — ABNORMAL HIGH (ref 11.4–15.2)

## 2016-03-14 MED ORDER — CEPHALEXIN 500 MG PO CAPS: 500.0000 mg | ORAL_CAPSULE | Freq: Two times a day (BID) | ORAL | 0 refills | Status: DC

## 2016-03-14 MED ORDER — THIAMINE HCL 100 MG PO TABS: 100.0000 mg | ORAL_TABLET | Freq: Every day | ORAL | Status: DC

## 2016-03-14 MED ORDER — FOLIC ACID 1 MG PO TABS: 1.0000 mg | ORAL_TABLET | Freq: Every day | ORAL | Status: DC

## 2016-03-14 NOTE — Progress Notes (Signed)
RN given report. AC notified of Discharge/Faxed.  DNR/Scripts placed in packet for transport with family.

## 2016-03-14 NOTE — Progress Notes (Signed)
Date: March 13, 2016 Discharge orders checked for needs. No needs present at time of discharge.  Discharging to local snf. Velva Harman, RN, BSN, [place]   [phone]

## 2016-03-14 NOTE — Progress Notes (Signed)
DC report called to [facility] center. All questions and concerns were addressed. Paper work packet will be sent with the pts wife, who will also be transferring the pt to the facility. Pt is alert and oriented times 4, VSS, skin is intact, no apparent s/s of distress or discomfort at this time. DC to rehab.

## 2016-03-14 NOTE — Discharge Summary (Signed)
Physician Discharge Summary  Peter Singh [id number] DOB: 04-25-1932 DOA: 03/11/2016  PCP: Peter Reel, MD  Admit date: 03/11/2016 Discharge date: 03/14/2016  Time spent: >45 minutes  Recommendations for Outpatient Follow-up:  Urology in 7-10 days PCP in 3-5 days as needed  Discharge Diagnoses:  Active Problems:   Sepsis [facility])   Discharge Condition: stable   Diet recommendation: low sodium   Filed Weights   03/11/16 0834 03/11/16 1510  Weight: 124.7 kg (275 lb) 121.8 kg (268 lb 8.3 oz)    History of present illness:  Peter Singh a 82 y.o.malewith medical history significant of atrial fibrillation and urinary retention related to BPH who presents to the hospital with the chief complaint of nausea and generalized malaise. For last 2 weeks patient has been not feeling well, occasional chills,generalized weakness, moderate lumbar back pain and intermittent dysuria. This morning around 4:30 in the morning he feltacutely ill, positive nausea and more severe weakness. The weakness was persistent, no improving or worsening factors, associated with nausea. Patient was evaluated by staff at the assisted living, due to patient's overall condition EMS was called and patient was brought to the hospital for further evaluation. Patient ambulates dependently, occasionally he uses a walker, he has mild dementia, and takes Flomax for BPH, for last 4-5 years he self catheterizes at night. Hehas had urinary tract infections in the past but not recently.  Hospital Course:   Sepsis secondary to gram neg rod UTI. Patient received iv antibiotics, remains afebrile Klebsiella. blood cultures ngtd. Then transitioned to keflex to complete the treatment regimen  -patient with self caths at night. D/w patient he preferred to cont foley at discharge and urology follow up as outpatient 7-10 days  Atrial fib. Cont coumadin  AKI. Resolved. Hold hctz at discharge   Dementia appears to be at  baseline- mild  HLD on atorvastatin  Leg/back pain-- neuropathy of unknown etiology-- ? Alcohol. pain shooting down leg. long standing -had tumor removed at [facility]. had nerve conduction study with Dr. Jaynee Eagles. Cont outpatient follow up   Thrombocytopenia. No s/s of acute bleeding. no heparin products. Recommended to recheck next week. Cont monitor on coumadin  D/w patient, his wife the plan of care   Procedures:  none (i.e. Studies not automatically included, echos, thoracentesis, etc; not x-rays)  Consultations:  none  Discharge Exam: Vitals:   03/14/16 0510 03/14/16 1015  BP: 140/83 113/67  Pulse: 71 82  Resp: 20 20  Temp: 97.8 F (36.6 C) 97.5 F (36.4 C)    General: alert, no distress  Cardiovascular: s1,s2 rrr Respiratory: CTA BL  Discharge Instructions  Discharge Instructions    Ambulatory referral to Urology    Complete by:  As directed   Call MD for:  temperature >100.4    Complete by:  As directed   Diet - low sodium heart healthy    Complete by:  As directed   Increase activity slowly    Complete by:  As directed       Medication List    STOP taking these medications   hydrochlorothiazide 25 MG tablet Commonly known as:  HYDRODIURIL     TAKE these medications   atorvastatin 20 MG tablet Commonly known as:  LIPITOR Take 20 mg by mouth every evening.   cephALEXin 500 MG capsule Commonly known as:  KEFLEX Take 1 capsule (500 mg total) by mouth every 12 (twelve) hours.   donepezil 10 MG tablet Commonly known as:  ARICEPT Take 1 tablet (  10 mg total) by mouth daily. What changed:  when to take this   folic acid 1 MG tablet Commonly known as:  FOLVITE Take 1 tablet (1 mg total) by mouth daily.   tamsulosin 0.4 MG Caps capsule Commonly known as:  FLOMAX Take 0.8 mg by mouth daily after supper.   thiamine 100 MG tablet Take 1 tablet (100 mg total) by mouth daily.   warfarin 5 MG tablet Commonly known as:  COUMADIN TAKE AS DIRECTED BY  COUMADIN CLINIC. What changed:  See the new instructions.      Allergies  Allergen Reactions  . Penicillins Rash    Has patient had a PCN reaction causing immediate rash, facial/tongue/throat swelling, SOB or lightheadedness with hypotension: unknown Has patient had a PCN reaction causing severe rash involving mucus membranes or skin necrosis: unknown Has patient had a PCN reaction that required hospitalization : unknown Has patient had a PCN reaction occurring within the last 10 years: no If all of the above answers are "NO", then may proceed with Cephalosporin use.       The results of significant diagnostics from this hospitalization (including imaging, microbiology, ancillary and laboratory) are listed below for reference.    Significant Diagnostic Studies: Dg Chest 2 View  Result Date: 03/11/2016 CLINICAL DATA:  Nausea and vomiting, possible hematemesis, also cough congestion and shortness of breath. History of atrial fibrillation, hypertension, remote history of smoking. EXAM: CHEST  2 VIEW COMPARISON:  Report of a chest x-ray dated February 03, 2007. FINDINGS: The lungs are adequately inflated. There is no focal infiltrate. There is no pleural effusion. The heart is top-normal in size. The central pulmonary vascularity is mildly prominent. There is calcification in the wall of the aortic arch. There is no mediastinal or hilar lymphadenopathy. There is multilevel degenerative disc disease of the thoracic spine. IMPRESSION: Top-normal cardiac size with mild central pulmonary vascular prominence suggests low-grade compensated CHF. There is no interstitial edema. There is no pneumonia nor pleural effusion. Electronically Signed   By: David  Martinique M.D.   On: 03/11/2016 09:29   US Renal  Result Date: 03/11/2016 CLINICAL DATA:  UTI EXAM: RENAL / URINARY TRACT ULTRASOUND COMPLETE COMPARISON:  03/11/2016 FINDINGS: Right Kidney: Length: 12.5 cm. There is a 1.8 cm echogenic lesion arising from the  upper pole of the left kidney similar to that seen on recent CT. Left Kidney: Length: 12.8 cm. Echogenicity within normal limits. No mass or hydronephrosis visualized. Bladder: Decompressed by Foley catheter. IMPRESSION: No obstructive changes are noted. Indeterminate lesion in the upper pole of the right kidney similar to that seen on recent CT examination. Nonemergent outpatient MRI is recommended for further evaluation for improved imaging technique. Electronically Signed   By: Inez Catalina M.D.   On: 03/11/2016 16:20   Ct Renal Stone Study  Result Date: 03/11/2016 CLINICAL DATA:  Nausea and generalized malaise for the last 2 weeks. EXAM: CT ABDOMEN AND PELVIS WITHOUT CONTRAST TECHNIQUE: Multidetector CT imaging of the abdomen and pelvis was performed following the standard protocol without IV contrast. COMPARISON:  None FINDINGS: Lower chest: Mild interstitial thickening identified in the lung bases. Hepatobiliary: No mass visualized on this un-enhanced exam. Pancreas: No mass or inflammatory process identified on this un-enhanced exam. Spleen: Within normal limits in size. Adrenals/Urinary Tract: Normal appearance of the adrenal glands. Bilateral and symmetric perinephric fat stranding noted, nonspecific.Exophytic lesion arising from the upper pole of the right kidney measures 1.8 cm and 24 HU. This is incompletely characterized without  IV contrast. Tiny nonobstructing left renal calculus measures 3 mm, image 32 of series 2. The urinary bladder is collapsed around a Foley catheter balloon. Stomach/Bowel: Stomach is normal. Within horizontal portion of the duodenum there is a 1.5 cm lipoma, image 41 of series 2. They pathologic dilatation of the large or small bowel loops identified. Vascular/Lymphatic: Calcified atherosclerotic disease involves the abdominal aorta. No aneurysm. No enlarged retroperitoneal or mesenteric adenopathy. No enlarged pelvic or inguinal lymph nodes. Reproductive: Prostate gland  enlargement is identified. Other: No significant free fluid or fluid collections identified. Musculoskeletal: There is multi level degenerative disc disease throughout the lumbar spine. IMPRESSION: 1. No acute findings identified within the abdomen or pelvis. 2. Nonobstructing left renal calculus. 3. Indeterminate, intermediate attenuating structure arising from the upper pole of right kidney. This could represent a hyperdense cyst or solid renal mass. Further investigation with nonemergent renal mass protocol CT or MRI may be considered. 4. Aortic atherosclerosis Electronically Signed   By: Kerby Moors M.D.   On: 03/11/2016 14:59    Microbiology: Recent Results (from the past 240 hour(s))  Urine culture     Status: Abnormal   Collection Time: 03/11/16 10:10 AM  Result Value Ref Range Status   Specimen Description URINE, CATHETERIZED  Final   Special Requests NONE  Final   Culture >=100,000 COLONIES/mL KLEBSIELLA PNEUMONIAE (A)  Final   Report Status 03/13/2016 FINAL  Final   Organism ID, Bacteria KLEBSIELLA PNEUMONIAE (A)  Final      Susceptibility   Klebsiella pneumoniae - MIC*    AMPICILLIN >=32 RESISTANT Resistant     CEFAZOLIN <=4 SENSITIVE Sensitive     CEFTRIAXONE <=1 SENSITIVE Sensitive     CIPROFLOXACIN <=0.25 SENSITIVE Sensitive     GENTAMICIN <=1 SENSITIVE Sensitive     IMIPENEM <=0.25 SENSITIVE Sensitive     NITROFURANTOIN 64 INTERMEDIATE Intermediate     TRIMETH/SULFA <=20 SENSITIVE Sensitive     AMPICILLIN/SULBACTAM 4 SENSITIVE Sensitive     PIP/TAZO <=4 SENSITIVE Sensitive     Extended ESBL NEGATIVE Sensitive     * >=100,000 COLONIES/mL KLEBSIELLA PNEUMONIAE  Blood Culture (routine x 2)     Status: None (Preliminary result)   Collection Time: 03/11/16 10:15 AM  Result Value Ref Range Status   Specimen Description BLOOD RIGHT ANTECUBITAL  Final   Special Requests BOTTLES DRAWN AEROBIC AND ANAEROBIC 10ML  Final   Culture   Final    NO GROWTH 3 DAYS Performed at [facility]    Report Status PENDING  Incomplete  Blood Culture (routine x 2)     Status: None (Preliminary result)   Collection Time: 03/11/16 10:24 AM  Result Value Ref Range Status   Specimen Description BLOOD LEFT ANTECUBITAL  Final   Special Requests BOTTLES DRAWN AEROBIC AND ANAEROBIC 5ML  Final   Culture   Final    NO GROWTH 3 DAYS Performed at [facility]    Report Status PENDING  Incomplete  MRSA PCR Screening     Status: None   Collection Time: 03/11/16  3:20 PM  Result Value Ref Range Status   MRSA by PCR NEGATIVE NEGATIVE Final    Comment:        The GeneXpert MRSA Assay (FDA approved for NASAL specimens only), is one component of a comprehensive MRSA colonization surveillance program. It is not intended to diagnose MRSA infection nor to guide or monitor treatment for MRSA infections.      Labs: Basic Metabolic Panel:  Recent Labs Lab 03/11/16 0928 03/12/16 1025 03/13/16 0539 03/14/16 0607  NA 140 134* 136 138  K 3.0* 3.5 3.4* 3.8  CL 107 106 106 106  CO2 23 22 24 26   GLUCOSE 148* 161* 125* 114*  BUN 16 19 15 15   CREATININE 1.34* 1.13 0.88 0.89  CALCIUM 9.6 8.3* 8.8* 9.2   Liver Function Tests:  Recent Labs Lab 03/11/16 0928 03/12/16 1025  AST 28 31  ALT 18 17  ALKPHOS 68 50  BILITOT 1.7* 1.6*  PROT 6.0* 5.2*  ALBUMIN 3.7 3.0*   No results for input(s): LIPASE, AMYLASE in the last 168 hours. No results for input(s): AMMONIA in the last 168 hours. CBC:  Recent Labs Lab 03/11/16 0928 03/12/16 1025 03/13/16 0539 03/14/16 0607  WBC 4.0 6.8 4.9 3.2*  NEUTROABS 3.9  --   --  2.4  HGB 15.7 13.0 13.1 13.4  HCT 45.5 37.5* 38.1* 39.8  MCV 90.3 89.3 89.2 91.7  PLT 85* 72* 64* 61*   Cardiac Enzymes: No results for input(s): CKTOTAL, CKMB, CKMBINDEX, TROPONINI in the last 168 hours. BNP: BNP (last 3 results) No results for input(s): BNP in the last 8760 hours.  ProBNP (last 3 results) No results for input(s): PROBNP in the  last 8760 hours.  CBG: No results for input(s): GLUCAP in the last 168 hours.     SignedKinnie Feil  [facility] 03/14/2016, 1:27 PM

## 2016-03-14 NOTE — Clinical Social Work Placement (Signed)
   CLINICAL SOCIAL WORK PLACEMENT  NOTE  Date:  03/14/2016  Patient Details  Name: Peter Singh MRN: [id number] Date of Birth: 06/05/1932  Clinical Social Work is seeking post-discharge placement for this patient at the [place] level of care (*CSW will initial, date and re-position this form in  chart as items are completed):  Yes   Patient/family provided with [facility] Work Department's list of facilities offering this level of care within the geographic area requested by the patient (or if unable, by the patient's family).  Yes   Patient/family informed of their freedom to choose among providers that offer the needed level of care, that participate in Medicare, Medicaid or managed care program needed by the patient, have an available bed and are willing to accept the patient.  Yes   Patient/family informed of [facility]'s ownership interest in [facility] and [facility], as well as of the fact that they are under no obligation to receive care at these facilities.  PASRR submitted to EDS on 03/14/16     PASRR number received on 03/14/16     Existing PASRR number confirmed on       FL2 transmitted to all facilities in geographic area requested by pt/family on       FL2 transmitted to all facilities within larger geographic area on 03/14/16     Patient informed that his/her managed care company has contracts with or will negotiate with certain facilities, including the following:  [facility]     Yes   Patient/family informed of bed offers received.  Patient chooses bed at       Physician recommends and patient chooses bed at [facility]    Patient to be transferred to [facility] on 03/14/16.  Patient to be transferred to facility by [facility]      Patient family notified on 03/14/16 of transfer.  Name of family member notified:  Rosamaria Lints      PHYSICIAN Please prepare priority discharge summary, including  medications, Please sign DNR, Please prepare prescriptions     Additional Comment:    _______________________________________________ Lia Hopping, LCSW 03/14/2016, 11:25 AM

## 2016-03-15 DIAGNOSIS — I4891 Unspecified atrial fibrillation: Secondary | ICD-10-CM | POA: Diagnosis not present

## 2016-03-16 LAB — CULTURE, BLOOD (ROUTINE X 2)
CULTURE: NO GROWTH
CULTURE: NO GROWTH

## 2016-03-18 ENCOUNTER — Encounter: Payer: Self-pay | Admitting: Internal Medicine

## 2016-03-18 ENCOUNTER — Non-Acute Institutional Stay (SKILLED_NURSING_FACILITY): Payer: Medicare Other | Admitting: Internal Medicine

## 2016-03-18 DIAGNOSIS — N2889 Other specified disorders of kidney and ureter: Secondary | ICD-10-CM

## 2016-03-18 DIAGNOSIS — G603 Idiopathic progressive neuropathy: Secondary | ICD-10-CM | POA: Diagnosis not present

## 2016-03-18 DIAGNOSIS — R338 Other retention of urine: Secondary | ICD-10-CM

## 2016-03-18 DIAGNOSIS — N200 Calculus of kidney: Secondary | ICD-10-CM

## 2016-03-18 DIAGNOSIS — G609 Hereditary and idiopathic neuropathy, unspecified: Secondary | ICD-10-CM

## 2016-03-18 DIAGNOSIS — Z7901 Long term (current) use of anticoagulants: Secondary | ICD-10-CM | POA: Diagnosis not present

## 2016-03-18 DIAGNOSIS — R531 Weakness: Secondary | ICD-10-CM

## 2016-03-18 DIAGNOSIS — I482 Chronic atrial fibrillation, unspecified: Secondary | ICD-10-CM

## 2016-03-18 DIAGNOSIS — R2689 Other abnormalities of gait and mobility: Secondary | ICD-10-CM | POA: Diagnosis not present

## 2016-03-18 DIAGNOSIS — E785 Hyperlipidemia, unspecified: Secondary | ICD-10-CM

## 2016-03-18 DIAGNOSIS — F0390 Unspecified dementia without behavioral disturbance: Secondary | ICD-10-CM | POA: Diagnosis not present

## 2016-03-18 DIAGNOSIS — M6281 Muscle weakness (generalized): Secondary | ICD-10-CM | POA: Diagnosis not present

## 2016-03-18 DIAGNOSIS — N401 Enlarged prostate with lower urinary tract symptoms: Secondary | ICD-10-CM

## 2016-03-18 DIAGNOSIS — A419 Sepsis, unspecified organism: Secondary | ICD-10-CM

## 2016-03-18 DIAGNOSIS — F03A0 Unspecified dementia, mild, without behavioral disturbance, psychotic disturbance, mood disturbance, and anxiety: Secondary | ICD-10-CM

## 2016-03-18 DIAGNOSIS — N400 Enlarged prostate without lower urinary tract symptoms: Secondary | ICD-10-CM

## 2016-03-18 DIAGNOSIS — R2681 Unsteadiness on feet: Secondary | ICD-10-CM | POA: Diagnosis not present

## 2016-03-18 DIAGNOSIS — R278 Other lack of coordination: Secondary | ICD-10-CM | POA: Diagnosis not present

## 2016-03-18 DIAGNOSIS — I10 Essential (primary) hypertension: Secondary | ICD-10-CM

## 2016-03-18 DIAGNOSIS — N390 Urinary tract infection, site not specified: Secondary | ICD-10-CM | POA: Diagnosis not present

## 2016-03-18 DIAGNOSIS — A4159 Other Gram-negative sepsis: Secondary | ICD-10-CM | POA: Diagnosis not present

## 2016-03-18 NOTE — Progress Notes (Signed)
Patient ID: Peter Singh, male   DOB: 10/26/1931, 82 y.o.   MRN: [id number]  Provider:  Rexene Edison. Mariea Clonts, D.O., C.M.D. Location:   [place] Room Number: [place] of Service:  SNF (31)  PCP: Precious Reel, MD Patient Care Team: [name], [name]  Extended Emergency Contact Information Primary Emergency Contact: Gul,[name] Address: [address]          [place], [place] [address] [name] of [place] Phone: [phone] Relation: Spouse  Code Status: DNR Goals of Care: Advanced Directive information Advanced Directives 03/18/2016  Does patient have an advance directive? Yes  Type of Advance Directive Out of facility DNR (pink MOST or yellow form);[place];Living will  Copy of advanced directive(s) in chart? Yes  Pre-existing out of facility DNR order (yellow form or pink MOST form) Yellow form placed in chart (order not valid for inpatient use);Pink MOST form placed in chart (order not valid for inpatient use)   Chief Complaint  Patient presents with  . New Admit To SNF    Rehab admission    HPI: Patient is a 82 y.o. male  Independent living resident with h/o cognitive impairment (sounds like early dementia due to functional loss of ability to manage meds on his own--follows with Dr. Jaynee Eagles and was "stable" at his last visit per his wife), hyperlipidemia, neuropathy, htn, afib on anticoagulation with coumadin, BPH with urinary retention on flomax and doing I/O caths at hs prior to hospitalization was seen today for admission to [facility] s/p hospitalization 03/11/16-03/14/16 with sepsis.  He presented 9/5 with nonspecific complaints of 2 wks of occasional chills, generalized malaise, moderate low back pain and intermittent dysuria.  He then awoke early that am with nausea and severe weakness.  Nursing evaluated and he was sent via ems to the hospital.  He ambulates with his walker at  baseline.    He was found to have a UTI secondary to gram negative rods (klebsiella) and treated with IV abx.  He was transitioned to keflex. He was discharged with a foley catheter in place with plans to f/u with urology in 7-10 days.Marland Kitchen  His home coumadin dose was continued for his afib.  He had an INR here which was subtherapeutic at 1.6 and same dose was continued.  Recheck today again reveals subtherapeutic INR.    When seen, his wife was with him and asking about his need for the MRI of his kidneys for the possible mass noted and when he would f/u with Dr. Diona Fanti.  Apparently, he had a renal US that showed no obstructive changes on 9/5, but an indeterminate lesion was noted in the right kidney upper pole.  CT renal stone study also showed this mass (intermediate attenuating structure which could be a hyperdense cyst or solid renal mass).  He also had a nonobstructing left renal calculus and enlarged prostate gland.  He had no lymphadenopathy noted.  An MRI was recommended by radiology to further assess the mass.  His renal function improved from  Cr 1.34 to 0.89 on discharge.  His mild dementia was at baseline.  He scored 30/30 on his MMSE upon arrival here at [facility] rehab.  He is on aricept.  His wife notes his short term memory loss has been stable.  He has had some chronic neuropathy and pain radiating down his leg from his back.  He had a tumor removed at [facility] and had a NCS  through neurology which he is to f/u with Dr. Jaynee Eagles.  There is some question if he was drinking excessive alcohol in the past contributing. He's had thrombocytopenia (61 on 9/8), as well, but no acute bleeding.  He is on thiamine and folic acid.  Currently, he drinks 2 drinks per day by report.    His hctz was stopped in the midst of his sepsis, but not restarted at discharge so bp and edema will need monitoring.    I have asked staff to get his most recent updated notes from Dr. Keane Police office.    Past Medical History:    Diagnosis Date  . Atrial fibrillation ([place])   . Diverticulosis of colon (without mention of hemorrhage)   . Hyperlipemia   . Hypertension   . Melanoma ([place])   . Personal history of colonic polyps 1999 & 2004   adenomatous polyps  . Ventricular hypertrophy    Past Surgical History:  Procedure Laterality Date  . KNEE SURGERY     right  . PILONIDAL CYST DRAINAGE    . schwanoma tumor lumbar spine    . SPINE SURGERY     tumor removed  . TONSILLECTOMY AND ADENOIDECTOMY      reports that he quit smoking about 40 years ago. His smoking use included Cigarettes. He smoked 3.00 packs per day. He has never used smokeless tobacco. He reports that he drinks alcohol. He reports that he does not use drugs. Social History   Social History  . Marital status: Married    Spouse name: N/A  . Number of children: 2  . Years of education: N/A   Occupational History  . retired    Social History Main Topics  . Smoking status: Former Smoker    Packs/day: 3.00    Types: Cigarettes    Quit date: 06/09/1975  . Smokeless tobacco: Never Used  . Alcohol use 0.0 oz/week     Comment: 2 drinks per day  . Drug use: No  . Sexual activity: Not on file   Other Topics Concern  . Not on file   Social History Narrative   Patient is married with 2 children    Patient is retired    Patient lives at [facility]: Is the patient deaf or have difficulty hearing?: Yes Does the patient have difficulty seeing, even when wearing glasses/contacts?: No Does the patient have difficulty concentrating, remembering, or making decisions?: Yes Does the patient have difficulty walking or climbing stairs?: Yes Does the patient have difficulty dressing or bathing?: Yes Does the patient have difficulty doing errands alone such as visiting a doctor's office or shopping?: Yes  Family History  Problem Relation Age of Onset  . Stroke Father   . Neuropathy Neg Hx     Health Maintenance   Topic Date Due  . ZOSTAVAX  04/02/1994  . PNA vac Low Risk Adult (1 of 2 - PCV13) 04/03/1999  . INFLUENZA VACCINE  02/05/2016  . TETANUS/TDAP  01/06/2022    Allergies  Allergen Reactions  . Penicillins Rash    Has patient had a PCN reaction causing immediate rash, facial/tongue/throat swelling, SOB or lightheadedness with hypotension: unknown Has patient had a PCN reaction causing severe rash involving mucus membranes or skin necrosis: unknown Has patient had a PCN reaction that required hospitalization : unknown Has patient had a PCN reaction occurring within the last 10 years: no If all of the above answers are "NO", then may proceed with  Cephalosporin use.       Medication List       Accurate as of 03/18/16 11:59 PM. Always use your most recent med list.          atorvastatin 20 MG tablet Commonly known as:  LIPITOR Take 20 mg by mouth every evening.   cephALEXin 500 MG capsule Commonly known as:  KEFLEX Take 1 capsule (500 mg total) by mouth every 12 (twelve) hours.   donepezil 10 MG tablet Commonly known as:  ARICEPT Take 10 mg by mouth at bedtime.   folic acid 1 MG tablet Commonly known as:  FOLVITE Take 1 tablet (1 mg total) by mouth daily.   tamsulosin 0.4 MG Caps capsule Commonly known as:  FLOMAX Take 0.8 mg by mouth daily after supper.   thiamine 100 MG tablet Take 1 tablet (100 mg total) by mouth daily.   warfarin 5 MG tablet Commonly known as:  COUMADIN Take 5 mg by mouth daily. As directed       Review of Systems  Constitutional: Positive for activity change and fatigue. Negative for appetite change, chills, fever and unexpected weight change.  HENT: Positive for hearing loss. Negative for congestion and sinus pressure.   Eyes: Negative for visual disturbance.  Respiratory: Negative for chest tightness and shortness of breath.   Cardiovascular: Positive for leg swelling. Negative for chest pain and palpitations.       Has had edema, not at  present, but feet are up in bed  Gastrointestinal: Negative for abdominal pain, constipation, diarrhea, nausea and vomiting.       Nausea resolved  Genitourinary: Negative for decreased urine volume, penile pain, penile swelling and scrotal swelling.       Uncomfortable with foley in place; normally has some frequency and retention and caths at hs  Musculoskeletal: Positive for arthralgias, back pain and gait problem.       Back improved currently  Skin: Negative for color change.  Neurological: Positive for weakness and numbness. Negative for dizziness.  Hematological: Negative for adenopathy. Bruises/bleeds easily.       On coumadin and has low platelets  Psychiatric/Behavioral: Positive for confusion. Negative for agitation and behavioral problems. The patient is not nervous/anxious.        No depression, has some short term memory loss--is supported by his wife in [place] home    Vitals:   03/18/16 1051  BP: 136/69  Pulse: 74  Resp: 18  Temp: (!) 96.9 F (36.1 C)  TempSrc: Oral  SpO2: 99%  Weight: 266 lb (120.7 kg)   Body mass index is 29.97 kg/m. Physical Exam  Constitutional: He is oriented to person, place, and time. He appears well-developed and well-nourished. No distress.  HENT:  Head: Normocephalic and atraumatic.  Right Ear: External ear normal.  Left Ear: External ear normal.  Nose: Nose normal.  Mouth/Throat: Oropharynx is clear and moist.  Eyes: Conjunctivae and EOM are normal. Pupils are equal, round, and reactive to light.  Neck: Normal range of motion. Neck supple. No JVD present. No thyromegaly present.  Cardiovascular: Intact distal pulses.   No murmur heard. irreg irreg  Pulmonary/Chest: Effort normal and breath sounds normal. No respiratory distress. He has no wheezes. He has no rales.  Abdominal: Soft. Bowel sounds are normal. He exhibits no distension and no mass. There is no tenderness. There is no rebound and no guarding.  Genitourinary:  Genitourinary  Comments: Foley in place draining amber colored urine  Musculoskeletal: Normal range of  motion.  Walks with walker   Lymphadenopathy:    He has no cervical adenopathy.  Neurological: He is alert and oriented to person, place, and time. No cranial nerve deficit.  Diminished sensation in feet and lower legs to monofilament  Skin: Skin is warm and dry.  Erythema of face  Psychiatric: He has a normal mood and affect. His behavior is normal.  Poor short term memory, looks to his wife to help with questions I ask him    Labs reviewed: Basic Metabolic Panel:  Recent Labs  03/12/16 1025 03/13/16 0539 03/14/16 0607  NA 134* 136 138  K 3.5 3.4* 3.8  CL 106 106 106  CO2 22 24 26   GLUCOSE 161* 125* 114*  BUN 19 15 15   CREATININE 1.13 0.88 0.89  CALCIUM 8.3* 8.8* 9.2   Liver Function Tests:  Recent Labs  03/11/16 0928 03/12/16 1025  AST 28 31  ALT 18 17  ALKPHOS 68 50  BILITOT 1.7* 1.6*  PROT 6.0* 5.2*  ALBUMIN 3.7 3.0*   No results for input(s): LIPASE, AMYLASE in the last 8760 hours. No results for input(s): AMMONIA in the last 8760 hours. CBC:  Recent Labs  03/11/16 0928 03/12/16 1025 03/13/16 0539 03/14/16 0607  WBC 4.0 6.8 4.9 3.2*  NEUTROABS 3.9  --   --  2.4  HGB 15.7 13.0 13.1 13.4  HCT 45.5 37.5* 38.1* 39.8  MCV 90.3 89.3 89.2 91.7  PLT 85* 72* 64* 61*   Cardiac Enzymes: No results for input(s): CKTOTAL, CKMB, CKMBINDEX, TROPONINI in the last 8760 hours. BNP: Invalid input(s): POCBNP Lab Results  Component Value Date   HGBA1C 6.0 (H) 07/19/2014   Lab Results  Component Value Date   TSH 1.080 07/04/2014   Lab Results  Component Value Date   VITAMINB12 346 11/14/2015   Lab Results  Component Value Date   FOLATE >20.0 11/14/2015   No results found for: IRON, TIBC, FERRITIN  Imaging and Procedures obtained prior to SNF admission: Dg Chest 2 View  Result Date: 03/11/2016 CLINICAL DATA:  Nausea and vomiting, possible hematemesis, also cough  congestion and shortness of breath. History of atrial fibrillation, hypertension, remote history of smoking. EXAM: CHEST  2 VIEW COMPARISON:  Report of a chest x-ray dated February 03, 2007. FINDINGS: The lungs are adequately inflated. There is no focal infiltrate. There is no pleural effusion. The heart is top-normal in size. The central pulmonary vascularity is mildly prominent. There is calcification in the wall of the aortic arch. There is no mediastinal or hilar lymphadenopathy. There is multilevel degenerative disc disease of the thoracic spine. IMPRESSION: Top-normal cardiac size with mild central pulmonary vascular prominence suggests low-grade compensated CHF. There is no interstitial edema. There is no pneumonia nor pleural effusion. Electronically Signed   By: David  Martinique M.D.   On: 03/11/2016 09:29   US Renal  Result Date: 03/11/2016 CLINICAL DATA:  UTI EXAM: RENAL / URINARY TRACT ULTRASOUND COMPLETE COMPARISON:  03/11/2016 FINDINGS: Right Kidney: Length: 12.5 cm. There is a 1.8 cm echogenic lesion arising from the upper pole of the left kidney similar to that seen on recent CT. Left Kidney: Length: 12.8 cm. Echogenicity within normal limits. No mass or hydronephrosis visualized. Bladder: Decompressed by Foley catheter. IMPRESSION: No obstructive changes are noted. Indeterminate lesion in the upper pole of the right kidney similar to that seen on recent CT examination. Nonemergent outpatient MRI is recommended for further evaluation for improved imaging technique. Electronically Signed   By: Elta [place]  Lukens M.D.   On: 03/11/2016 16:20   Ct Renal Stone Study  Result Date: 03/11/2016 CLINICAL DATA:  Nausea and generalized malaise for the last 2 weeks. EXAM: CT ABDOMEN AND PELVIS WITHOUT CONTRAST TECHNIQUE: Multidetector CT imaging of the abdomen and pelvis was performed following the standard protocol without IV contrast. COMPARISON:  None FINDINGS: Lower chest: Mild interstitial thickening identified in  the lung bases. Hepatobiliary: No mass visualized on this un-enhanced exam. Pancreas: No mass or inflammatory process identified on this un-enhanced exam. Spleen: Within normal limits in size. Adrenals/Urinary Tract: Normal appearance of the adrenal glands. Bilateral and symmetric perinephric fat stranding noted, nonspecific.Exophytic lesion arising from the upper pole of the right kidney measures 1.8 cm and 24 HU. This is incompletely characterized without IV contrast. Tiny nonobstructing left renal calculus measures 3 mm, image 32 of series 2. The urinary bladder is collapsed around a Foley catheter balloon. Stomach/Bowel: Stomach is normal. Within horizontal portion of the duodenum there is a 1.5 cm lipoma, image 41 of series 2. They pathologic dilatation of the large or small bowel loops identified. Vascular/Lymphatic: Calcified atherosclerotic disease involves the abdominal aorta. No aneurysm. No enlarged retroperitoneal or mesenteric adenopathy. No enlarged pelvic or inguinal lymph nodes. Reproductive: Prostate gland enlargement is identified. Other: No significant free fluid or fluid collections identified. Musculoskeletal: There is multi level degenerative disc disease throughout the lumbar spine. IMPRESSION: 1. No acute findings identified within the abdomen or pelvis. 2. Nonobstructing left renal calculus. 3. Indeterminate, intermediate attenuating structure arising from the upper pole of right kidney. This could represent a hyperdense cyst or solid renal mass. Further investigation with nonemergent renal mass protocol CT or MRI may be considered. 4. Aortic atherosclerosis Electronically Signed   By: Kerby Moors M.D.   On: 03/11/2016 14:59    Assessment/Plan 1. Sepsis secondary to UTI [facility]) -suspect this was made worse by his left nonobstructing kidney stone, possible right kidney mass and bph with urinary retention -he completed IV abx at the hospital for klebsiella and was then switched to  keflex at d/c  2. Right kidney mass -unclear with CT and [place] if this is a mass or cyst--need MRI to assess attenuation better -this has been ordered to be done before he sees urology  3. Kidney stone on left side -nonobstructing per imaging, awaits urology f/u for further management -with klebsiella, this may have contributed to his infection  4. BPH (benign prostatic hypertrophy) with urinary retention -had been I/O cathing at hs, but now has a foley in place pending urology appt -encouraged hydration as urine was amber rather than pale yellow -also on flomax  5. Generalized weakness -is here for PT, OT s/p hospitalization with sepsis--participating well and to use walker regularly to prevent falls  6. Chronic atrial fibrillation (HCC) -cont coumadin anticoagulation, has not had rate control difficulties so not on meds for that  7. Chronic anticoagulation -increase dose of coumadin to 7.5mg  po daily and recheck PT/INR on 9/15  8. Mild dementia -follows with Dr. Jaynee Eagles from neurology -continues on aricept and his wife helps him with meds at home, appts, some short term memory items -he scores 30/30 on MMSE -previous job and education level not in epic--need Dr. Keane Police notes  9. Hereditary and idiopathic peripheral neuropathy -continues on thiamine and folic acid and use of walker for support -had NCS done with Dr. Jaynee Eagles and has f/u with her planned  10. Essential hypertension -bp controlled, was only on hctz prehospitalization (this was stopped  it appears due to dehydration and acute renal failure)--monitor bp and if elevated over 140/90 or edema becomes significant, might consider a different agent (with his BPH issues)  11. Hyperlipidemia -cont current statin therapy and monitor, pt also overweight  Family/ staff Communication:  Discussed with family.   Discussed with nursing staff.    Labs/tests ordered:  MRI renal, f/u cbc, bmp, INR 9/15

## 2016-03-19 DIAGNOSIS — R2681 Unsteadiness on feet: Secondary | ICD-10-CM | POA: Diagnosis not present

## 2016-03-19 DIAGNOSIS — N179 Acute kidney failure, unspecified: Secondary | ICD-10-CM | POA: Diagnosis not present

## 2016-03-19 DIAGNOSIS — A4159 Other Gram-negative sepsis: Secondary | ICD-10-CM | POA: Diagnosis not present

## 2016-03-19 DIAGNOSIS — G603 Idiopathic progressive neuropathy: Secondary | ICD-10-CM | POA: Diagnosis not present

## 2016-03-19 DIAGNOSIS — I481 Persistent atrial fibrillation: Secondary | ICD-10-CM | POA: Diagnosis not present

## 2016-03-19 DIAGNOSIS — R278 Other lack of coordination: Secondary | ICD-10-CM | POA: Diagnosis not present

## 2016-03-19 DIAGNOSIS — I4891 Unspecified atrial fibrillation: Secondary | ICD-10-CM | POA: Diagnosis not present

## 2016-03-19 DIAGNOSIS — R2689 Other abnormalities of gait and mobility: Secondary | ICD-10-CM | POA: Diagnosis not present

## 2016-03-19 DIAGNOSIS — D6949 Other primary thrombocytopenia: Secondary | ICD-10-CM | POA: Diagnosis not present

## 2016-03-19 DIAGNOSIS — M6281 Muscle weakness (generalized): Secondary | ICD-10-CM | POA: Diagnosis not present

## 2016-03-20 DIAGNOSIS — R338 Other retention of urine: Secondary | ICD-10-CM | POA: Diagnosis not present

## 2016-03-20 DIAGNOSIS — R278 Other lack of coordination: Secondary | ICD-10-CM | POA: Diagnosis not present

## 2016-03-20 DIAGNOSIS — R2689 Other abnormalities of gait and mobility: Secondary | ICD-10-CM | POA: Diagnosis not present

## 2016-03-20 DIAGNOSIS — M6281 Muscle weakness (generalized): Secondary | ICD-10-CM | POA: Diagnosis not present

## 2016-03-20 DIAGNOSIS — G603 Idiopathic progressive neuropathy: Secondary | ICD-10-CM | POA: Diagnosis not present

## 2016-03-20 DIAGNOSIS — R2681 Unsteadiness on feet: Secondary | ICD-10-CM | POA: Diagnosis not present

## 2016-03-20 DIAGNOSIS — A4159 Other Gram-negative sepsis: Secondary | ICD-10-CM | POA: Diagnosis not present

## 2016-03-21 DIAGNOSIS — G603 Idiopathic progressive neuropathy: Secondary | ICD-10-CM | POA: Diagnosis not present

## 2016-03-21 DIAGNOSIS — Z7901 Long term (current) use of anticoagulants: Secondary | ICD-10-CM | POA: Diagnosis not present

## 2016-03-21 DIAGNOSIS — M6281 Muscle weakness (generalized): Secondary | ICD-10-CM | POA: Diagnosis not present

## 2016-03-21 DIAGNOSIS — A4159 Other Gram-negative sepsis: Secondary | ICD-10-CM | POA: Diagnosis not present

## 2016-03-21 DIAGNOSIS — R278 Other lack of coordination: Secondary | ICD-10-CM | POA: Diagnosis not present

## 2016-03-21 DIAGNOSIS — R2689 Other abnormalities of gait and mobility: Secondary | ICD-10-CM | POA: Diagnosis not present

## 2016-03-21 DIAGNOSIS — I4891 Unspecified atrial fibrillation: Secondary | ICD-10-CM | POA: Diagnosis not present

## 2016-03-21 DIAGNOSIS — R2681 Unsteadiness on feet: Secondary | ICD-10-CM | POA: Diagnosis not present

## 2016-03-23 ENCOUNTER — Encounter: Payer: Self-pay | Admitting: Internal Medicine

## 2016-03-23 DIAGNOSIS — G603 Idiopathic progressive neuropathy: Secondary | ICD-10-CM | POA: Diagnosis not present

## 2016-03-23 DIAGNOSIS — M6281 Muscle weakness (generalized): Secondary | ICD-10-CM | POA: Diagnosis not present

## 2016-03-23 DIAGNOSIS — R2689 Other abnormalities of gait and mobility: Secondary | ICD-10-CM | POA: Diagnosis not present

## 2016-03-23 DIAGNOSIS — A4159 Other Gram-negative sepsis: Secondary | ICD-10-CM | POA: Diagnosis not present

## 2016-03-23 DIAGNOSIS — R278 Other lack of coordination: Secondary | ICD-10-CM | POA: Diagnosis not present

## 2016-03-23 DIAGNOSIS — R2681 Unsteadiness on feet: Secondary | ICD-10-CM | POA: Diagnosis not present

## 2016-03-24 DIAGNOSIS — I4891 Unspecified atrial fibrillation: Secondary | ICD-10-CM | POA: Diagnosis not present

## 2016-03-24 DIAGNOSIS — A4159 Other Gram-negative sepsis: Secondary | ICD-10-CM | POA: Diagnosis not present

## 2016-03-24 DIAGNOSIS — Z7901 Long term (current) use of anticoagulants: Secondary | ICD-10-CM | POA: Diagnosis not present

## 2016-03-24 DIAGNOSIS — M6281 Muscle weakness (generalized): Secondary | ICD-10-CM | POA: Diagnosis not present

## 2016-03-24 DIAGNOSIS — R2689 Other abnormalities of gait and mobility: Secondary | ICD-10-CM | POA: Diagnosis not present

## 2016-03-24 DIAGNOSIS — R2681 Unsteadiness on feet: Secondary | ICD-10-CM | POA: Diagnosis not present

## 2016-03-24 DIAGNOSIS — G603 Idiopathic progressive neuropathy: Secondary | ICD-10-CM | POA: Diagnosis not present

## 2016-03-24 DIAGNOSIS — R278 Other lack of coordination: Secondary | ICD-10-CM | POA: Diagnosis not present

## 2016-03-25 DIAGNOSIS — M6281 Muscle weakness (generalized): Secondary | ICD-10-CM | POA: Diagnosis not present

## 2016-03-25 DIAGNOSIS — R278 Other lack of coordination: Secondary | ICD-10-CM | POA: Diagnosis not present

## 2016-03-25 DIAGNOSIS — A4159 Other Gram-negative sepsis: Secondary | ICD-10-CM | POA: Diagnosis not present

## 2016-03-25 DIAGNOSIS — R2681 Unsteadiness on feet: Secondary | ICD-10-CM | POA: Diagnosis not present

## 2016-03-25 DIAGNOSIS — R2689 Other abnormalities of gait and mobility: Secondary | ICD-10-CM | POA: Diagnosis not present

## 2016-03-25 DIAGNOSIS — G603 Idiopathic progressive neuropathy: Secondary | ICD-10-CM | POA: Diagnosis not present

## 2016-03-26 DIAGNOSIS — A4159 Other Gram-negative sepsis: Secondary | ICD-10-CM | POA: Diagnosis not present

## 2016-03-26 DIAGNOSIS — R278 Other lack of coordination: Secondary | ICD-10-CM | POA: Diagnosis not present

## 2016-03-26 DIAGNOSIS — R2689 Other abnormalities of gait and mobility: Secondary | ICD-10-CM | POA: Diagnosis not present

## 2016-03-26 DIAGNOSIS — M6281 Muscle weakness (generalized): Secondary | ICD-10-CM | POA: Diagnosis not present

## 2016-03-26 DIAGNOSIS — G603 Idiopathic progressive neuropathy: Secondary | ICD-10-CM | POA: Diagnosis not present

## 2016-03-26 DIAGNOSIS — R2681 Unsteadiness on feet: Secondary | ICD-10-CM | POA: Diagnosis not present

## 2016-03-27 DIAGNOSIS — I4891 Unspecified atrial fibrillation: Secondary | ICD-10-CM | POA: Diagnosis not present

## 2016-03-27 DIAGNOSIS — R2681 Unsteadiness on feet: Secondary | ICD-10-CM | POA: Diagnosis not present

## 2016-03-27 DIAGNOSIS — N2889 Other specified disorders of kidney and ureter: Secondary | ICD-10-CM | POA: Diagnosis not present

## 2016-03-27 DIAGNOSIS — R2689 Other abnormalities of gait and mobility: Secondary | ICD-10-CM | POA: Diagnosis not present

## 2016-03-27 DIAGNOSIS — G603 Idiopathic progressive neuropathy: Secondary | ICD-10-CM | POA: Diagnosis not present

## 2016-03-27 DIAGNOSIS — M6281 Muscle weakness (generalized): Secondary | ICD-10-CM | POA: Diagnosis not present

## 2016-03-27 DIAGNOSIS — A4159 Other Gram-negative sepsis: Secondary | ICD-10-CM | POA: Diagnosis not present

## 2016-03-27 DIAGNOSIS — N179 Acute kidney failure, unspecified: Secondary | ICD-10-CM | POA: Diagnosis not present

## 2016-03-27 DIAGNOSIS — Z7901 Long term (current) use of anticoagulants: Secondary | ICD-10-CM | POA: Diagnosis not present

## 2016-03-27 DIAGNOSIS — R278 Other lack of coordination: Secondary | ICD-10-CM | POA: Diagnosis not present

## 2016-03-27 DIAGNOSIS — N281 Cyst of kidney, acquired: Secondary | ICD-10-CM | POA: Diagnosis not present

## 2016-03-27 LAB — BASIC METABOLIC PANEL
BUN: 11 mg/dL (ref 4–21)
Creatinine: 0.9 mg/dL (ref 0.6–1.3)
POTASSIUM: 4.2 mmol/L (ref 3.4–5.3)
SODIUM: 142 mmol/L (ref 137–147)

## 2016-03-28 ENCOUNTER — Telehealth: Payer: Self-pay | Admitting: *Deleted

## 2016-03-28 DIAGNOSIS — M6281 Muscle weakness (generalized): Secondary | ICD-10-CM | POA: Diagnosis not present

## 2016-03-28 DIAGNOSIS — G603 Idiopathic progressive neuropathy: Secondary | ICD-10-CM | POA: Diagnosis not present

## 2016-03-28 DIAGNOSIS — R2681 Unsteadiness on feet: Secondary | ICD-10-CM | POA: Diagnosis not present

## 2016-03-28 DIAGNOSIS — R278 Other lack of coordination: Secondary | ICD-10-CM | POA: Diagnosis not present

## 2016-03-28 DIAGNOSIS — R2689 Other abnormalities of gait and mobility: Secondary | ICD-10-CM | POA: Diagnosis not present

## 2016-03-28 DIAGNOSIS — A4159 Other Gram-negative sepsis: Secondary | ICD-10-CM | POA: Diagnosis not present

## 2016-03-28 NOTE — Telephone Encounter (Signed)
Telephone [facility], pt is not in independent living at moment he's in Rehab since hosp discharge. Nurse in rehab at [facility] state that NP there is managing Coumadin. INR yesterday was 1.9 and they have orders to recheck him on Monday. They will inform us when he goes back to independent living.

## 2016-03-31 DIAGNOSIS — M6281 Muscle weakness (generalized): Secondary | ICD-10-CM | POA: Diagnosis not present

## 2016-03-31 DIAGNOSIS — R278 Other lack of coordination: Secondary | ICD-10-CM | POA: Diagnosis not present

## 2016-03-31 DIAGNOSIS — A4159 Other Gram-negative sepsis: Secondary | ICD-10-CM | POA: Diagnosis not present

## 2016-03-31 DIAGNOSIS — R2681 Unsteadiness on feet: Secondary | ICD-10-CM | POA: Diagnosis not present

## 2016-03-31 DIAGNOSIS — G603 Idiopathic progressive neuropathy: Secondary | ICD-10-CM | POA: Diagnosis not present

## 2016-03-31 DIAGNOSIS — I4891 Unspecified atrial fibrillation: Secondary | ICD-10-CM | POA: Diagnosis not present

## 2016-03-31 DIAGNOSIS — R2689 Other abnormalities of gait and mobility: Secondary | ICD-10-CM | POA: Diagnosis not present

## 2016-04-01 DIAGNOSIS — R2689 Other abnormalities of gait and mobility: Secondary | ICD-10-CM | POA: Diagnosis not present

## 2016-04-01 DIAGNOSIS — R278 Other lack of coordination: Secondary | ICD-10-CM | POA: Diagnosis not present

## 2016-04-01 DIAGNOSIS — G603 Idiopathic progressive neuropathy: Secondary | ICD-10-CM | POA: Diagnosis not present

## 2016-04-01 DIAGNOSIS — M6281 Muscle weakness (generalized): Secondary | ICD-10-CM | POA: Diagnosis not present

## 2016-04-01 DIAGNOSIS — A4159 Other Gram-negative sepsis: Secondary | ICD-10-CM | POA: Diagnosis not present

## 2016-04-01 DIAGNOSIS — R2681 Unsteadiness on feet: Secondary | ICD-10-CM | POA: Diagnosis not present

## 2016-04-02 DIAGNOSIS — R278 Other lack of coordination: Secondary | ICD-10-CM | POA: Diagnosis not present

## 2016-04-02 DIAGNOSIS — M6281 Muscle weakness (generalized): Secondary | ICD-10-CM | POA: Diagnosis not present

## 2016-04-02 DIAGNOSIS — A4159 Other Gram-negative sepsis: Secondary | ICD-10-CM | POA: Diagnosis not present

## 2016-04-02 DIAGNOSIS — R2681 Unsteadiness on feet: Secondary | ICD-10-CM | POA: Diagnosis not present

## 2016-04-02 DIAGNOSIS — R2689 Other abnormalities of gait and mobility: Secondary | ICD-10-CM | POA: Diagnosis not present

## 2016-04-02 DIAGNOSIS — G603 Idiopathic progressive neuropathy: Secondary | ICD-10-CM | POA: Diagnosis not present

## 2016-04-03 DIAGNOSIS — I4891 Unspecified atrial fibrillation: Secondary | ICD-10-CM | POA: Diagnosis not present

## 2016-04-03 DIAGNOSIS — R2689 Other abnormalities of gait and mobility: Secondary | ICD-10-CM | POA: Diagnosis not present

## 2016-04-03 DIAGNOSIS — A4159 Other Gram-negative sepsis: Secondary | ICD-10-CM | POA: Diagnosis not present

## 2016-04-03 DIAGNOSIS — R2681 Unsteadiness on feet: Secondary | ICD-10-CM | POA: Diagnosis not present

## 2016-04-03 DIAGNOSIS — G603 Idiopathic progressive neuropathy: Secondary | ICD-10-CM | POA: Diagnosis not present

## 2016-04-03 DIAGNOSIS — M6281 Muscle weakness (generalized): Secondary | ICD-10-CM | POA: Diagnosis not present

## 2016-04-03 DIAGNOSIS — R278 Other lack of coordination: Secondary | ICD-10-CM | POA: Diagnosis not present

## 2016-04-03 DIAGNOSIS — I482 Chronic atrial fibrillation: Secondary | ICD-10-CM | POA: Diagnosis not present

## 2016-04-03 LAB — PROTIME-INR: PROTIME: 25.6 s — AB (ref 10.0–13.8)

## 2016-04-03 LAB — POCT INR: INR: 2.4 — AB (ref 0.9–1.1)

## 2016-04-04 ENCOUNTER — Encounter: Payer: Self-pay | Admitting: Adult Health

## 2016-04-04 ENCOUNTER — Non-Acute Institutional Stay (SKILLED_NURSING_FACILITY): Payer: Medicare Other | Admitting: Adult Health

## 2016-04-04 DIAGNOSIS — F0390 Unspecified dementia without behavioral disturbance: Secondary | ICD-10-CM | POA: Diagnosis not present

## 2016-04-04 DIAGNOSIS — D696 Thrombocytopenia, unspecified: Secondary | ICD-10-CM | POA: Diagnosis not present

## 2016-04-04 DIAGNOSIS — I482 Chronic atrial fibrillation, unspecified: Secondary | ICD-10-CM

## 2016-04-04 DIAGNOSIS — N2889 Other specified disorders of kidney and ureter: Secondary | ICD-10-CM | POA: Diagnosis not present

## 2016-04-04 DIAGNOSIS — E7800 Pure hypercholesterolemia, unspecified: Secondary | ICD-10-CM

## 2016-04-04 DIAGNOSIS — R338 Other retention of urine: Secondary | ICD-10-CM | POA: Diagnosis not present

## 2016-04-04 DIAGNOSIS — A419 Sepsis, unspecified organism: Secondary | ICD-10-CM | POA: Diagnosis not present

## 2016-04-04 DIAGNOSIS — M5489 Other dorsalgia: Secondary | ICD-10-CM

## 2016-04-04 DIAGNOSIS — R2681 Unsteadiness on feet: Secondary | ICD-10-CM | POA: Diagnosis not present

## 2016-04-04 DIAGNOSIS — K5901 Slow transit constipation: Secondary | ICD-10-CM

## 2016-04-04 DIAGNOSIS — N401 Enlarged prostate with lower urinary tract symptoms: Secondary | ICD-10-CM

## 2016-04-04 DIAGNOSIS — M6281 Muscle weakness (generalized): Secondary | ICD-10-CM | POA: Diagnosis not present

## 2016-04-04 DIAGNOSIS — G603 Idiopathic progressive neuropathy: Secondary | ICD-10-CM | POA: Diagnosis not present

## 2016-04-04 DIAGNOSIS — G609 Hereditary and idiopathic neuropathy, unspecified: Secondary | ICD-10-CM

## 2016-04-04 DIAGNOSIS — I10 Essential (primary) hypertension: Secondary | ICD-10-CM | POA: Diagnosis not present

## 2016-04-04 DIAGNOSIS — R2689 Other abnormalities of gait and mobility: Secondary | ICD-10-CM | POA: Diagnosis not present

## 2016-04-04 DIAGNOSIS — A4159 Other Gram-negative sepsis: Secondary | ICD-10-CM | POA: Diagnosis not present

## 2016-04-04 DIAGNOSIS — Z7901 Long term (current) use of anticoagulants: Secondary | ICD-10-CM

## 2016-04-04 DIAGNOSIS — R278 Other lack of coordination: Secondary | ICD-10-CM | POA: Diagnosis not present

## 2016-04-04 DIAGNOSIS — F03A0 Unspecified dementia, mild, without behavioral disturbance, psychotic disturbance, mood disturbance, and anxiety: Secondary | ICD-10-CM

## 2016-04-04 NOTE — Progress Notes (Signed)
Patient ID: Peter Singh, male   DOB: 1932-07-06, 82 y.o.   MRN: [id number]  Provider: [name] NP Location:   [facility]   Place of Service:  SNF (58)  PCP: Precious Reel, MD Patient Care Team: [name], [name]  Extended Emergency Contact Information Primary Emergency Contact: Gatliff,[name] Address: [address]          [place], [place] [address] [name] of [place] Phone: [phone] Mobile Phone: [phone] Relation: Spouse Secondary Emergency Contact: Montville,Odai Address: [place] [address] [name] of [place] Phone: [phone] Mobile Phone: [phone] Relation: Son  Code Status: DNR Goals of Care: Advanced Directive information Advanced Directives 03/18/2016  Does patient have an advance directive? Yes  Type of Advance Directive Out of facility DNR (pink MOST or yellow form);[place];Living will  Copy of advanced directive(s) in chart? Yes  Pre-existing out of facility DNR order (yellow form or pink MOST form) Yellow form placed in chart (order not valid for inpatient use);Pink MOST form placed in chart (order not valid for inpatient use)   Chief Complaint  Patient presents with  . Discharge Note    HPI: Patient is a 82 y.o. male  Independent living resident. He has been in rehab since 9/8 for weakness after being hospitalized for sepsis. He was hospitalized on 9/5  for weakness, nausea, moderate lumbar back pain and intermittent dysuria secondary to sepsis. D/c'ed on 9/8. He has finished his antibiotic therapy of Keflex for klebsiella. He has hx of BPH with urinary retention on flomax and doing I/O caths at hs prior to hospitalization. He denies dysuria, hematuria or difficulty cathing himself.  He had a renal US that showed no obstructive changes on 9/5, but an indeterminate lesion was noted in the right kidney upper pole. CT renal stone study  also showed this mass (intermediate attenuating structure which could be a hyperdense cyst or solid renal mass). He is scheduled to follow up with [facility] in Dec.  He lives with his wife, and she manages his medications. He has a hx of mild cognitive impairment. MMSE 30/30 with pentagons distorted, good clock drawing. However, he has some short term memory impairment. He is taking Aricept. He has had an OT assessment at his home. They are working to adjust a toilet for him due to his height of 6'6".  He states he has no trouble dressing, bathing and feeding himself. While in rehab he has used a walker for ambulation. Staff reports he has a steppage type gait while ambulating with walker. This is probably due to his history of neuropathy. He states he has some numbness and tingling to feet.  He was seen by Dr. Jaynee Eagles for Neuro consult in May. It was found that he had gotten lost in the car a few times. TSH, B-12  and Folate were checked and wnl. He was evaluated for neuropathy which was thought to be possibly related to his alcohol consumption. He reports that he usually has 2 bourbons/day. He has had no alcohol during his stay in rehab and he denies shaking, trembling, or sweating. He is taking thiamine and folic acid.  He is on coumadin for chronic a. Fib. He is followed by cardiology for coumadin management/INR testing. His last INR was 2.4 on 9/28 and will be redrawn in 3 days.  He states he has a good appetite, but has  not had a BM in 2 days. He has had MOM, with no relief. He has lost 11 lb since 9/5. He states he has pain rated 4/10 to R side of lower back. He states the pain is sharp, with no radiation. It is worse when he gets up. He denies pain with ambulation. His pain has been managed well with Tylenol, prn.    Past Medical History:  Diagnosis Date  . Atrial fibrillation ([place])   . Diverticulosis of colon (without mention of hemorrhage)   . Hyperlipemia   . Hypertension   . Melanoma ([place])     . Personal history of colonic polyps 1999 & 2004   adenomatous polyps  . Ventricular hypertrophy    Past Surgical History:  Procedure Laterality Date  . KNEE SURGERY     right  . PILONIDAL CYST DRAINAGE    . schwanoma tumor lumbar spine    . SPINE SURGERY     tumor removed  . TONSILLECTOMY AND ADENOIDECTOMY      reports that he quit smoking about 40 years ago. His smoking use included Cigarettes. He smoked 3.00 packs per day. He has never used smokeless tobacco. He reports that he drinks alcohol. He reports that he does not use drugs. Social History   Social History  . Marital status: Married    Spouse name: N/A  . Number of children: 2  . Years of education: N/A   Occupational History  . retired    Social History Main Topics  . Smoking status: Former Smoker    Packs/day: 3.00    Types: Cigarettes    Quit date: 06/09/1975  . Smokeless tobacco: Never Used  . Alcohol use 0.0 oz/week     Comment: 2 drinks per day  . Drug use: No  . Sexual activity: Not on file   Other Topics Concern  . Not on file   Social History Narrative   Patient is married with 2 children    Patient is retired    Patient lives at [place] Status Survey:    Family History  Problem Relation Age of Onset  . Stroke Father   . Neuropathy Neg Hx     Health Maintenance  Topic Date Due  . ZOSTAVAX  04/02/1994  . PNA vac Low Risk Adult (1 of 2 - PCV13) 04/03/1999  . INFLUENZA VACCINE  02/05/2016  . TETANUS/TDAP  01/06/2022    Allergies  Allergen Reactions  . Penicillins Rash    Has patient had a PCN reaction causing immediate rash, facial/tongue/throat swelling, SOB or lightheadedness with hypotension: unknown Has patient had a PCN reaction causing severe rash involving mucus membranes or skin necrosis: unknown Has patient had a PCN reaction that required hospitalization : unknown Has patient had a PCN reaction occurring within the last 10 years: no If all of the above  answers are "NO", then may proceed with Cephalosporin use.       Medication List       Accurate as of 04/04/16 10:26 AM. Always use your most recent med list.          acetaminophen 325 MG tablet Commonly known as:  TYLENOL Take 650 mg by mouth every 6 (six) hours as needed for moderate pain (for up to 72 hrs). Document area of discomfort and effectiveness of medication.   atorvastatin 20 MG tablet Commonly known as:  LIPITOR Take 20 mg by mouth every evening.   donepezil 10 MG tablet Commonly  known as:  ARICEPT Take 10 mg by mouth at bedtime.   folic acid 1 MG tablet Commonly known as:  FOLVITE Take 1 tablet (1 mg total) by mouth daily.   tamsulosin 0.4 MG Caps capsule Commonly known as:  FLOMAX Take 0.8 mg by mouth daily after supper.   thiamine 100 MG tablet Take 1 tablet (100 mg total) by mouth daily.   warfarin 7.5 MG tablet Commonly known as:  COUMADIN Take 7.5 mg by mouth daily. Take 7.5 mg on M,, W, F   warfarin 10 MG tablet Commonly known as:  COUMADIN Take 10 mg by mouth daily. Take 10 mg on Tue, Thur,Sat, Sun       Review of Systems  Constitutional: Positive for unexpected weight change. Negative for activity change, appetite change, chills, fatigue and fever.  HENT: Positive for hearing loss. Negative for congestion and trouble swallowing.   Eyes: Negative for visual disturbance.  Respiratory: Negative for apnea, cough, choking, chest tightness and shortness of breath.   Cardiovascular: Positive for leg swelling. Negative for chest pain and palpitations.       Has had edema, not at present, but feet are up in bed  Gastrointestinal: Positive for constipation. Negative for abdominal distention, abdominal pain, diarrhea, nausea, rectal pain and vomiting.       Nausea resolved  Genitourinary: Positive for flank pain. Negative for decreased urine volume, dysuria and hematuria.  Musculoskeletal: Positive for arthralgias, back pain and gait problem.  Negative for myalgias.       Back improved currently  Skin: Negative for color change.  Neurological: Positive for numbness (bilat numbness to feet). Negative for dizziness, tremors, speech difficulty and weakness.  Hematological: Negative for adenopathy. Bruises/bleeds easily.       On coumadin and has low platelets  Psychiatric/Behavioral: Positive for confusion. Negative for agitation, behavioral problems and dysphoric mood. The patient is not nervous/anxious.        No depression, has some short term memory loss--is supported by his wife in [place] home    Vitals:   04/04/16 0952  BP: 129/71  Pulse: (!) 52  Temp: 97.3 F (36.3 C)  SpO2: 97%  Weight: 257 lb (116.6 kg)  Body mass index is 28.95 kg/m.  Wt Readings from Last 3 Encounters:  04/04/16 257 lb (116.6 kg)  03/18/16 266 lb (120.7 kg)  03/11/16 268 lb 8.3 oz (121.8 kg)   Physical Exam  Constitutional: He is oriented to person, place, and time. He appears well-developed and well-nourished. No distress.  HENT:  Head: Normocephalic and atraumatic.  Mouth/Throat: Oropharynx is clear and moist.  Eyes: Conjunctivae and EOM are normal. Pupils are equal, round, and reactive to light.  Neck: Normal range of motion. Neck supple. No JVD present. No thyromegaly present.  Cardiovascular: Normal heart sounds and intact distal pulses.  Exam reveals no gallop and no friction rub.   No murmur heard. Regular HR palpated at distal radial pulse  Pulmonary/Chest: Effort normal and breath sounds normal. No respiratory distress. He has no wheezes. He has no rales.  Abdominal: Soft. Bowel sounds are normal. He exhibits no distension and no mass. There is no tenderness. There is no rebound and no guarding.  Musculoskeletal: Normal range of motion. He exhibits no edema, tenderness or deformity.  Walks with walker. Negative straight leg raise bilat  Lymphadenopathy:    He has no cervical adenopathy.  Neurological: He is alert and oriented to  person, place, and time. No cranial nerve deficit.  Steppage gait while using walker  Skin: Skin is warm and dry. He is not diaphoretic.  Mild macular red rash to low back  Psychiatric: He has a normal mood and affect. His behavior is normal.  Short term memory deficit.   Nursing note and vitals reviewed.   Labs reviewed: Basic Metabolic Panel:  Recent Labs  03/12/16 1025 03/13/16 0539 03/14/16 0607 03/27/16  NA 134* 136 138 142  K 3.5 3.4* 3.8 4.2  CL 106 106 106  --   CO2 22 24 26   --   GLUCOSE 161* 125* 114*  --   BUN 19 15 15 11   CREATININE 1.13 0.88 0.89 0.9  CALCIUM 8.3* 8.8* 9.2  --    Liver Function Tests:  Recent Labs  03/11/16 0928 03/12/16 1025  AST 28 31  ALT 18 17  ALKPHOS 68 50  BILITOT 1.7* 1.6*  PROT 6.0* 5.2*  ALBUMIN 3.7 3.0*   No results for input(s): LIPASE, AMYLASE in the last 8760 hours. No results for input(s): AMMONIA in the last 8760 hours. CBC:  Recent Labs  03/11/16 0928 03/12/16 1025 03/13/16 0539 03/14/16 0607  WBC 4.0 6.8 4.9 3.2*  NEUTROABS 3.9  --   --  2.4  HGB 15.7 13.0 13.1 13.4  HCT 45.5 37.5* 38.1* 39.8  MCV 90.3 89.3 89.2 91.7  PLT 85* 72* 64* 61*   Cardiac Enzymes: No results for input(s): CKTOTAL, CKMB, CKMBINDEX, TROPONINI in the last 8760 hours. BNP: Invalid input(s): POCBNP Lab Results  Component Value Date   HGBA1C 6.0 (H) 07/19/2014   Lab Results  Component Value Date   TSH 1.080 07/04/2014   Lab Results  Component Value Date   VITAMINB12 346 11/14/2015   Lab Results  Component Value Date   FOLATE >20.0 11/14/2015   No results found for: IRON, TIBC, FERRITIN Lab Results  Component Value Date   INR 2.4 (A) 04/03/2016   INR 1.77 03/14/2016   INR 1.91 03/13/2016   PROTIME 25.6 (A) 04/03/2016    Imaging and Procedures obtained prior to SNF admission: Dg Chest 2 View  Result Date: 03/11/2016 CLINICAL DATA:  Nausea and vomiting, possible hematemesis, also cough congestion and shortness of  breath. History of atrial fibrillation, hypertension, remote history of smoking. EXAM: CHEST  2 VIEW COMPARISON:  Report of a chest x-ray dated February 03, 2007. FINDINGS: The lungs are adequately inflated. There is no focal infiltrate. There is no pleural effusion. The heart is top-normal in size. The central pulmonary vascularity is mildly prominent. There is calcification in the wall of the aortic arch. There is no mediastinal or hilar lymphadenopathy. There is multilevel degenerative disc disease of the thoracic spine. IMPRESSION: Top-normal cardiac size with mild central pulmonary vascular prominence suggests low-grade compensated CHF. There is no interstitial edema. There is no pneumonia nor pleural effusion. Electronically Signed   By: David  Martinique M.D.   On: 03/11/2016 09:29   US Renal  Result Date: 03/11/2016 CLINICAL DATA:  UTI EXAM: RENAL / URINARY TRACT ULTRASOUND COMPLETE COMPARISON:  03/11/2016 FINDINGS: Right Kidney: Length: 12.5 cm. There is a 1.8 cm echogenic lesion arising from the upper pole of the left kidney similar to that seen on recent CT. Left Kidney: Length: 12.8 cm. Echogenicity within normal limits. No mass or hydronephrosis visualized. Bladder: Decompressed by Foley catheter. IMPRESSION: No obstructive changes are noted. Indeterminate lesion in the upper pole of the right kidney similar to that seen on recent CT examination. Nonemergent outpatient MRI is recommended  for further evaluation for improved imaging technique. Electronically Signed   By: Inez Catalina M.D.   On: 03/11/2016 16:20   Ct Renal Stone Study  Result Date: 03/11/2016 CLINICAL DATA:  Nausea and generalized malaise for the last 2 weeks. EXAM: CT ABDOMEN AND PELVIS WITHOUT CONTRAST TECHNIQUE: Multidetector CT imaging of the abdomen and pelvis was performed following the standard protocol without IV contrast. COMPARISON:  None FINDINGS: Lower chest: Mild interstitial thickening identified in the lung bases.  Hepatobiliary: No mass visualized on this un-enhanced exam. Pancreas: No mass or inflammatory process identified on this un-enhanced exam. Spleen: Within normal limits in size. Adrenals/Urinary Tract: Normal appearance of the adrenal glands. Bilateral and symmetric perinephric fat stranding noted, nonspecific.Exophytic lesion arising from the upper pole of the right kidney measures 1.8 cm and 24 HU. This is incompletely characterized without IV contrast. Tiny nonobstructing left renal calculus measures 3 mm, image 32 of series 2. The urinary bladder is collapsed around a Foley catheter balloon. Stomach/Bowel: Stomach is normal. Within horizontal portion of the duodenum there is a 1.5 cm lipoma, image 41 of series 2. They pathologic dilatation of the large or small bowel loops identified. Vascular/Lymphatic: Calcified atherosclerotic disease involves the abdominal aorta. No aneurysm. No enlarged retroperitoneal or mesenteric adenopathy. No enlarged pelvic or inguinal lymph nodes. Reproductive: Prostate gland enlargement is identified. Other: No significant free fluid or fluid collections identified. Musculoskeletal: There is multi level degenerative disc disease throughout the lumbar spine. IMPRESSION: 1. No acute findings identified within the abdomen or pelvis. 2. Nonobstructing left renal calculus. 3. Indeterminate, intermediate attenuating structure arising from the upper pole of right kidney. This could represent a hyperdense cyst or solid renal mass. Further investigation with nonemergent renal mass protocol CT or MRI may be considered. 4. Aortic atherosclerosis Electronically Signed   By: Kerby Moors M.D.   On: 03/11/2016 14:59    Assessment/Plan 1. Sepsis, due to unspecified organism ([place]) -Resolved,no further fever or symptoms of pyelo -Weakness has improved from admission -Has received therapy and is ready for discharge  2. Hereditary and idiopathic peripheral neuropathy -Chronic problem.  -He  has some numbness in feet, but denies pain. Due to the fact that he drinks ETOH daily and has steppage gait, it is not prudent to begin Neurontin unless pain developes -thiamine 100 mg qd -Continue to monitor and f/u with neuro.   3. Chronic atrial fibrillation (HCC) -Stable -rate controlled without meds -CVA risk reduction with coumadin -Followed by Cards  4. Chronic anticoagulation -INR 2.4 Therapeutic range -Continue current dose of coumadin -Next INR due on 10/2  5. Mild dementia -Short term memory loss -Continue Aricept -Continue f/u with Neuro -OT will arrange for driving consult/evaluation  6. Right-sided back pain, unspecified location -Pt reports mild pain, staff is reporting moderate to severe pain -may be due to underlying renal mass, but also has a hx of lumbar radiculopathy -Recommend Tylenol 1 gram BID   7. Urinary retention due to benign prostatic hyperplasia CIC qhs Flomax 0.4 mg qhs F/U with urology in Dec  8. Essential hypertension Controlled Continue HCTZ at 12.5 mg qd  9. Hyperlipidemia COntinue lipitor and follow up with PCP  10. Renal mass, right Noted on CT at [facility]  The resident and his wife have not been notified of the results found on the CT on 03/27/16.  Will have [facility] connect with [facility] and contact the wife for clarification regarding the next apt and plan of care.   11. Constipation  Colace 100 mg BID  12. Thrombocytopenia   -trending down with unclear etiology -recheck CBC on Monday 10/2   PCP f/u in one month, urology as indicated Resident ready for [place] once home modifications have been made to the bathroom  Family/ staff Communication:  Discussed with family.   Discussed with nursing staff and resident  Labs/tests ordered:  INR, CBC Mon 10/2

## 2016-04-07 ENCOUNTER — Encounter: Payer: Self-pay | Admitting: Adult Health

## 2016-04-07 DIAGNOSIS — R2681 Unsteadiness on feet: Secondary | ICD-10-CM | POA: Diagnosis not present

## 2016-04-07 DIAGNOSIS — R2689 Other abnormalities of gait and mobility: Secondary | ICD-10-CM | POA: Diagnosis not present

## 2016-04-07 DIAGNOSIS — M6281 Muscle weakness (generalized): Secondary | ICD-10-CM | POA: Diagnosis not present

## 2016-04-07 DIAGNOSIS — I482 Chronic atrial fibrillation: Secondary | ICD-10-CM | POA: Diagnosis not present

## 2016-04-07 DIAGNOSIS — I4891 Unspecified atrial fibrillation: Secondary | ICD-10-CM | POA: Diagnosis not present

## 2016-04-07 DIAGNOSIS — R278 Other lack of coordination: Secondary | ICD-10-CM | POA: Diagnosis not present

## 2016-04-07 DIAGNOSIS — A4159 Other Gram-negative sepsis: Secondary | ICD-10-CM | POA: Diagnosis not present

## 2016-04-07 DIAGNOSIS — G603 Idiopathic progressive neuropathy: Secondary | ICD-10-CM | POA: Diagnosis not present

## 2016-04-07 LAB — PROTIME-INR: INR: 4 — AB (ref 0.9–1.1)

## 2016-04-07 NOTE — Progress Notes (Signed)
This encounter was created in error - please disregard.

## 2016-04-08 ENCOUNTER — Ambulatory Visit (INDEPENDENT_AMBULATORY_CARE_PROVIDER_SITE_OTHER): Payer: Medicare Other | Admitting: Interventional Cardiology

## 2016-04-08 DIAGNOSIS — M6281 Muscle weakness (generalized): Secondary | ICD-10-CM | POA: Diagnosis not present

## 2016-04-08 DIAGNOSIS — G603 Idiopathic progressive neuropathy: Secondary | ICD-10-CM | POA: Diagnosis not present

## 2016-04-08 DIAGNOSIS — I482 Chronic atrial fibrillation, unspecified: Secondary | ICD-10-CM

## 2016-04-08 DIAGNOSIS — Z5181 Encounter for therapeutic drug level monitoring: Secondary | ICD-10-CM | POA: Diagnosis not present

## 2016-04-08 DIAGNOSIS — I4891 Unspecified atrial fibrillation: Secondary | ICD-10-CM | POA: Diagnosis not present

## 2016-04-08 DIAGNOSIS — R278 Other lack of coordination: Secondary | ICD-10-CM | POA: Diagnosis not present

## 2016-04-08 DIAGNOSIS — R2681 Unsteadiness on feet: Secondary | ICD-10-CM | POA: Diagnosis not present

## 2016-04-08 DIAGNOSIS — A4159 Other Gram-negative sepsis: Secondary | ICD-10-CM | POA: Diagnosis not present

## 2016-04-08 DIAGNOSIS — R2689 Other abnormalities of gait and mobility: Secondary | ICD-10-CM | POA: Diagnosis not present

## 2016-04-08 LAB — PROTIME-INR: INR: 3.8 — AB (ref 0.9–1.1)

## 2016-04-08 NOTE — Addendum Note (Signed)
Addended by: Margretta Sidle on: 04/08/2016 03:34 PM   Modules accepted: Orders

## 2016-04-09 DIAGNOSIS — A4159 Other Gram-negative sepsis: Secondary | ICD-10-CM | POA: Diagnosis not present

## 2016-04-09 DIAGNOSIS — M6281 Muscle weakness (generalized): Secondary | ICD-10-CM | POA: Diagnosis not present

## 2016-04-09 DIAGNOSIS — R2681 Unsteadiness on feet: Secondary | ICD-10-CM | POA: Diagnosis not present

## 2016-04-09 DIAGNOSIS — R278 Other lack of coordination: Secondary | ICD-10-CM | POA: Diagnosis not present

## 2016-04-09 DIAGNOSIS — G603 Idiopathic progressive neuropathy: Secondary | ICD-10-CM | POA: Diagnosis not present

## 2016-04-09 DIAGNOSIS — R2689 Other abnormalities of gait and mobility: Secondary | ICD-10-CM | POA: Diagnosis not present

## 2016-04-10 DIAGNOSIS — M6281 Muscle weakness (generalized): Secondary | ICD-10-CM | POA: Diagnosis not present

## 2016-04-10 DIAGNOSIS — A4159 Other Gram-negative sepsis: Secondary | ICD-10-CM | POA: Diagnosis not present

## 2016-04-10 DIAGNOSIS — R2681 Unsteadiness on feet: Secondary | ICD-10-CM | POA: Diagnosis not present

## 2016-04-10 DIAGNOSIS — R278 Other lack of coordination: Secondary | ICD-10-CM | POA: Diagnosis not present

## 2016-04-10 DIAGNOSIS — G603 Idiopathic progressive neuropathy: Secondary | ICD-10-CM | POA: Diagnosis not present

## 2016-04-10 DIAGNOSIS — R2689 Other abnormalities of gait and mobility: Secondary | ICD-10-CM | POA: Diagnosis not present

## 2016-04-11 DIAGNOSIS — R278 Other lack of coordination: Secondary | ICD-10-CM | POA: Diagnosis not present

## 2016-04-11 DIAGNOSIS — R2681 Unsteadiness on feet: Secondary | ICD-10-CM | POA: Diagnosis not present

## 2016-04-11 DIAGNOSIS — G603 Idiopathic progressive neuropathy: Secondary | ICD-10-CM | POA: Diagnosis not present

## 2016-04-11 DIAGNOSIS — A4159 Other Gram-negative sepsis: Secondary | ICD-10-CM | POA: Diagnosis not present

## 2016-04-11 DIAGNOSIS — M6281 Muscle weakness (generalized): Secondary | ICD-10-CM | POA: Diagnosis not present

## 2016-04-11 DIAGNOSIS — R2689 Other abnormalities of gait and mobility: Secondary | ICD-10-CM | POA: Diagnosis not present

## 2016-04-14 DIAGNOSIS — E784 Other hyperlipidemia: Secondary | ICD-10-CM | POA: Diagnosis not present

## 2016-04-14 DIAGNOSIS — D698 Other specified hemorrhagic conditions: Secondary | ICD-10-CM | POA: Diagnosis not present

## 2016-04-15 ENCOUNTER — Ambulatory Visit (INDEPENDENT_AMBULATORY_CARE_PROVIDER_SITE_OTHER): Payer: Medicare Other | Admitting: Interventional Cardiology

## 2016-04-15 DIAGNOSIS — R278 Other lack of coordination: Secondary | ICD-10-CM | POA: Diagnosis not present

## 2016-04-15 DIAGNOSIS — I482 Chronic atrial fibrillation, unspecified: Secondary | ICD-10-CM

## 2016-04-15 DIAGNOSIS — I4891 Unspecified atrial fibrillation: Secondary | ICD-10-CM | POA: Diagnosis not present

## 2016-04-15 DIAGNOSIS — R2681 Unsteadiness on feet: Secondary | ICD-10-CM | POA: Diagnosis not present

## 2016-04-15 DIAGNOSIS — A4159 Other Gram-negative sepsis: Secondary | ICD-10-CM | POA: Diagnosis not present

## 2016-04-15 DIAGNOSIS — R2689 Other abnormalities of gait and mobility: Secondary | ICD-10-CM | POA: Diagnosis not present

## 2016-04-15 DIAGNOSIS — M6281 Muscle weakness (generalized): Secondary | ICD-10-CM | POA: Diagnosis not present

## 2016-04-15 DIAGNOSIS — G603 Idiopathic progressive neuropathy: Secondary | ICD-10-CM | POA: Diagnosis not present

## 2016-04-15 LAB — PROTIME-INR: INR: 3.6 — AB (ref 0.9–1.1)

## 2016-04-17 DIAGNOSIS — A4159 Other Gram-negative sepsis: Secondary | ICD-10-CM | POA: Diagnosis not present

## 2016-04-17 DIAGNOSIS — G603 Idiopathic progressive neuropathy: Secondary | ICD-10-CM | POA: Diagnosis not present

## 2016-04-17 DIAGNOSIS — R2681 Unsteadiness on feet: Secondary | ICD-10-CM | POA: Diagnosis not present

## 2016-04-17 DIAGNOSIS — M6281 Muscle weakness (generalized): Secondary | ICD-10-CM | POA: Diagnosis not present

## 2016-04-17 DIAGNOSIS — R2689 Other abnormalities of gait and mobility: Secondary | ICD-10-CM | POA: Diagnosis not present

## 2016-04-17 DIAGNOSIS — R278 Other lack of coordination: Secondary | ICD-10-CM | POA: Diagnosis not present

## 2016-04-18 DIAGNOSIS — R2689 Other abnormalities of gait and mobility: Secondary | ICD-10-CM | POA: Diagnosis not present

## 2016-04-18 DIAGNOSIS — G603 Idiopathic progressive neuropathy: Secondary | ICD-10-CM | POA: Diagnosis not present

## 2016-04-18 DIAGNOSIS — R2681 Unsteadiness on feet: Secondary | ICD-10-CM | POA: Diagnosis not present

## 2016-04-18 DIAGNOSIS — R278 Other lack of coordination: Secondary | ICD-10-CM | POA: Diagnosis not present

## 2016-04-18 DIAGNOSIS — M6281 Muscle weakness (generalized): Secondary | ICD-10-CM | POA: Diagnosis not present

## 2016-04-18 DIAGNOSIS — A4159 Other Gram-negative sepsis: Secondary | ICD-10-CM | POA: Diagnosis not present

## 2016-04-21 DIAGNOSIS — A4159 Other Gram-negative sepsis: Secondary | ICD-10-CM | POA: Diagnosis not present

## 2016-04-21 DIAGNOSIS — R2681 Unsteadiness on feet: Secondary | ICD-10-CM | POA: Diagnosis not present

## 2016-04-21 DIAGNOSIS — M6281 Muscle weakness (generalized): Secondary | ICD-10-CM | POA: Diagnosis not present

## 2016-04-21 DIAGNOSIS — R278 Other lack of coordination: Secondary | ICD-10-CM | POA: Diagnosis not present

## 2016-04-21 DIAGNOSIS — R2689 Other abnormalities of gait and mobility: Secondary | ICD-10-CM | POA: Diagnosis not present

## 2016-04-21 DIAGNOSIS — G603 Idiopathic progressive neuropathy: Secondary | ICD-10-CM | POA: Diagnosis not present

## 2016-04-22 ENCOUNTER — Ambulatory Visit (INDEPENDENT_AMBULATORY_CARE_PROVIDER_SITE_OTHER): Payer: Medicare Other | Admitting: Internal Medicine

## 2016-04-22 DIAGNOSIS — M6281 Muscle weakness (generalized): Secondary | ICD-10-CM | POA: Diagnosis not present

## 2016-04-22 DIAGNOSIS — A4159 Other Gram-negative sepsis: Secondary | ICD-10-CM | POA: Diagnosis not present

## 2016-04-22 DIAGNOSIS — I482 Chronic atrial fibrillation, unspecified: Secondary | ICD-10-CM

## 2016-04-22 DIAGNOSIS — R2681 Unsteadiness on feet: Secondary | ICD-10-CM | POA: Diagnosis not present

## 2016-04-22 DIAGNOSIS — G603 Idiopathic progressive neuropathy: Secondary | ICD-10-CM | POA: Diagnosis not present

## 2016-04-22 DIAGNOSIS — I4891 Unspecified atrial fibrillation: Secondary | ICD-10-CM | POA: Diagnosis not present

## 2016-04-22 DIAGNOSIS — R2689 Other abnormalities of gait and mobility: Secondary | ICD-10-CM | POA: Diagnosis not present

## 2016-04-22 DIAGNOSIS — R278 Other lack of coordination: Secondary | ICD-10-CM | POA: Diagnosis not present

## 2016-04-22 DIAGNOSIS — Z5181 Encounter for therapeutic drug level monitoring: Secondary | ICD-10-CM | POA: Diagnosis not present

## 2016-04-22 LAB — PROTIME-INR: INR: 2.8 — AB (ref 0.9–1.1)

## 2016-04-23 ENCOUNTER — Encounter: Payer: Self-pay | Admitting: Oncology

## 2016-04-23 ENCOUNTER — Telehealth: Payer: Self-pay | Admitting: Oncology

## 2016-04-23 NOTE — Telephone Encounter (Signed)
Tc to the pt, spoke to his wife to schedule appt. Appt scheduled w/[facility] on 11/16@2pm . Aware to have pt arrive at 130pm. Voiced understanding. Demographics verified. Letter mailed to the pt.

## 2016-04-24 DIAGNOSIS — A4159 Other Gram-negative sepsis: Secondary | ICD-10-CM | POA: Diagnosis not present

## 2016-04-24 DIAGNOSIS — M6281 Muscle weakness (generalized): Secondary | ICD-10-CM | POA: Diagnosis not present

## 2016-04-24 DIAGNOSIS — G603 Idiopathic progressive neuropathy: Secondary | ICD-10-CM | POA: Diagnosis not present

## 2016-04-24 DIAGNOSIS — R2689 Other abnormalities of gait and mobility: Secondary | ICD-10-CM | POA: Diagnosis not present

## 2016-04-24 DIAGNOSIS — R278 Other lack of coordination: Secondary | ICD-10-CM | POA: Diagnosis not present

## 2016-04-24 DIAGNOSIS — R2681 Unsteadiness on feet: Secondary | ICD-10-CM | POA: Diagnosis not present

## 2016-04-25 ENCOUNTER — Other Ambulatory Visit: Payer: Self-pay | Admitting: *Deleted

## 2016-04-25 MED ORDER — WARFARIN SODIUM 7.5 MG PO TABS: 7.5000 mg | ORAL_TABLET | Freq: Every day | ORAL | 2 refills | Status: DC

## 2016-04-29 DIAGNOSIS — R2681 Unsteadiness on feet: Secondary | ICD-10-CM | POA: Diagnosis not present

## 2016-04-29 DIAGNOSIS — G603 Idiopathic progressive neuropathy: Secondary | ICD-10-CM | POA: Diagnosis not present

## 2016-04-29 DIAGNOSIS — R2689 Other abnormalities of gait and mobility: Secondary | ICD-10-CM | POA: Diagnosis not present

## 2016-04-29 DIAGNOSIS — A4159 Other Gram-negative sepsis: Secondary | ICD-10-CM | POA: Diagnosis not present

## 2016-04-29 DIAGNOSIS — M6281 Muscle weakness (generalized): Secondary | ICD-10-CM | POA: Diagnosis not present

## 2016-04-29 DIAGNOSIS — R278 Other lack of coordination: Secondary | ICD-10-CM | POA: Diagnosis not present

## 2016-05-01 DIAGNOSIS — Z23 Encounter for immunization: Secondary | ICD-10-CM | POA: Diagnosis not present

## 2016-05-05 DIAGNOSIS — R358 Other polyuria: Secondary | ICD-10-CM | POA: Diagnosis not present

## 2016-05-05 DIAGNOSIS — R8299 Other abnormal findings in urine: Secondary | ICD-10-CM | POA: Diagnosis not present

## 2016-05-05 DIAGNOSIS — I2789 Other specified pulmonary heart diseases: Secondary | ICD-10-CM | POA: Diagnosis not present

## 2016-05-05 DIAGNOSIS — E784 Other hyperlipidemia: Secondary | ICD-10-CM | POA: Diagnosis not present

## 2016-05-05 DIAGNOSIS — Z125 Encounter for screening for malignant neoplasm of prostate: Secondary | ICD-10-CM | POA: Diagnosis not present

## 2016-05-06 DIAGNOSIS — I4891 Unspecified atrial fibrillation: Secondary | ICD-10-CM | POA: Diagnosis not present

## 2016-05-06 DIAGNOSIS — Z7901 Long term (current) use of anticoagulants: Secondary | ICD-10-CM | POA: Diagnosis not present

## 2016-05-06 LAB — PROTIME-INR: INR: 3.9 — AB (ref 0.9–1.1)

## 2016-05-07 ENCOUNTER — Ambulatory Visit (INDEPENDENT_AMBULATORY_CARE_PROVIDER_SITE_OTHER): Payer: Medicare Other | Admitting: Cardiology

## 2016-05-07 DIAGNOSIS — I482 Chronic atrial fibrillation, unspecified: Secondary | ICD-10-CM

## 2016-05-08 ENCOUNTER — Encounter: Payer: Self-pay | Admitting: Interventional Cardiology

## 2016-05-08 DIAGNOSIS — N2889 Other specified disorders of kidney and ureter: Secondary | ICD-10-CM | POA: Diagnosis not present

## 2016-05-08 DIAGNOSIS — Z7901 Long term (current) use of anticoagulants: Secondary | ICD-10-CM | POA: Diagnosis not present

## 2016-05-08 DIAGNOSIS — R627 Adult failure to thrive: Secondary | ICD-10-CM | POA: Diagnosis not present

## 2016-05-08 DIAGNOSIS — D698 Other specified hemorrhagic conditions: Secondary | ICD-10-CM | POA: Diagnosis not present

## 2016-05-08 DIAGNOSIS — N401 Enlarged prostate with lower urinary tract symptoms: Secondary | ICD-10-CM | POA: Diagnosis not present

## 2016-05-08 DIAGNOSIS — A4189 Other specified sepsis: Secondary | ICD-10-CM | POA: Diagnosis not present

## 2016-05-08 DIAGNOSIS — I700 Atherosclerosis of aorta: Secondary | ICD-10-CM | POA: Diagnosis not present

## 2016-05-08 DIAGNOSIS — D692 Other nonthrombocytopenic purpura: Secondary | ICD-10-CM | POA: Diagnosis not present

## 2016-05-08 DIAGNOSIS — M5136 Other intervertebral disc degeneration, lumbar region: Secondary | ICD-10-CM | POA: Diagnosis not present

## 2016-05-08 DIAGNOSIS — Z6829 Body mass index (BMI) 29.0-29.9, adult: Secondary | ICD-10-CM | POA: Diagnosis not present

## 2016-05-09 DIAGNOSIS — Z1212 Encounter for screening for malignant neoplasm of rectum: Secondary | ICD-10-CM | POA: Diagnosis not present

## 2016-05-09 LAB — IFOBT (OCCULT BLOOD): IFOBT: NEGATIVE

## 2016-05-12 DIAGNOSIS — Z0000 Encounter for general adult medical examination without abnormal findings: Secondary | ICD-10-CM | POA: Diagnosis not present

## 2016-05-12 DIAGNOSIS — Z1389 Encounter for screening for other disorder: Secondary | ICD-10-CM | POA: Diagnosis not present

## 2016-05-12 DIAGNOSIS — D692 Other nonthrombocytopenic purpura: Secondary | ICD-10-CM | POA: Diagnosis not present

## 2016-05-12 DIAGNOSIS — Z6830 Body mass index (BMI) 30.0-30.9, adult: Secondary | ICD-10-CM | POA: Diagnosis not present

## 2016-05-12 DIAGNOSIS — D696 Thrombocytopenia, unspecified: Secondary | ICD-10-CM | POA: Diagnosis not present

## 2016-05-12 DIAGNOSIS — C439 Malignant melanoma of skin, unspecified: Secondary | ICD-10-CM | POA: Diagnosis not present

## 2016-05-12 DIAGNOSIS — N2889 Other specified disorders of kidney and ureter: Secondary | ICD-10-CM | POA: Diagnosis not present

## 2016-05-12 DIAGNOSIS — I700 Atherosclerosis of aorta: Secondary | ICD-10-CM | POA: Diagnosis not present

## 2016-05-12 DIAGNOSIS — R627 Adult failure to thrive: Secondary | ICD-10-CM | POA: Diagnosis not present

## 2016-05-12 DIAGNOSIS — I480 Paroxysmal atrial fibrillation: Secondary | ICD-10-CM | POA: Diagnosis not present

## 2016-05-12 DIAGNOSIS — I739 Peripheral vascular disease, unspecified: Secondary | ICD-10-CM | POA: Diagnosis not present

## 2016-05-18 ENCOUNTER — Other Ambulatory Visit: Payer: Self-pay | Admitting: Internal Medicine

## 2016-05-20 DIAGNOSIS — Z5181 Encounter for therapeutic drug level monitoring: Secondary | ICD-10-CM | POA: Diagnosis not present

## 2016-05-20 DIAGNOSIS — I4891 Unspecified atrial fibrillation: Secondary | ICD-10-CM | POA: Diagnosis not present

## 2016-05-20 LAB — PROTIME-INR: INR: 3.4 — AB (ref 0.9–1.1)

## 2016-05-21 ENCOUNTER — Ambulatory Visit (INDEPENDENT_AMBULATORY_CARE_PROVIDER_SITE_OTHER): Payer: Medicare Other | Admitting: Cardiovascular Disease

## 2016-05-21 DIAGNOSIS — I482 Chronic atrial fibrillation, unspecified: Secondary | ICD-10-CM

## 2016-05-22 ENCOUNTER — Telehealth: Payer: Self-pay | Admitting: Oncology

## 2016-05-22 ENCOUNTER — Ambulatory Visit (HOSPITAL_BASED_OUTPATIENT_CLINIC_OR_DEPARTMENT_OTHER): Payer: Medicare Other | Admitting: Oncology

## 2016-05-22 VITALS — BP 128/66 | HR 66 | Temp 98.8°F | Resp 17 | Ht 79.0 in | Wt 262.2 lb

## 2016-05-22 DIAGNOSIS — D696 Thrombocytopenia, unspecified: Secondary | ICD-10-CM

## 2016-05-22 DIAGNOSIS — D709 Neutropenia, unspecified: Secondary | ICD-10-CM | POA: Diagnosis not present

## 2016-05-22 DIAGNOSIS — R93429 Abnormal radiologic findings on diagnostic imaging of unspecified kidney: Secondary | ICD-10-CM

## 2016-05-22 NOTE — Telephone Encounter (Signed)
Gave patient avs report and appointments for March  °

## 2016-05-22 NOTE — Progress Notes (Signed)
Reason for Referral: Thrombocytopenia.   HPI: 82 year old gentleman currently of [place] where he lived the majority of his life. He is a gentleman with history of atrial fibrillation chronically anticoagulated on Coumadin. He also has history of recurrent skin cancer and multiple skin procedures. He was hospitalized on 03/11/2016 for a urinary tract infection. At that time his CBC noted to have a platelet count of 85 which drifted down to 61 upon his discharge in 03/14/2016. His hemoglobin was normal throughout in his white cell count was normal but was noted to be 3.2 on the day of discharge. A repeat CBC obtained on 04/07/2016 show white cell count of 2.2 and a platelet count of 98. His hemoglobin is 14.2 with a normal MCV. Laboratory data were repeated on 04/14/2016 with Dr. Virgina Jock showed a white cell count of 3.6 hemoglobin of 15.9 and a platelet count of 86. His differential was normal at this time. His liver function tests and electrolytes are all within normal range. He does not have any symptoms related to these findings. He denied any epistaxis, hematochezia, melena or easy bruisability. He denied any hemoptysis or hematemesis. He currently lives in a senior living facility and ambulates without any difficulties. He drives short distances although he relies on his wife mostly for driving. He does ambulate with the help of walker and denied any recent falls or syncope. His appetite remains excellent and denied any constitutional symptoms.  He does not report any headaches, blurry vision, syncope or seizures. He does not report any fevers, chills, sweats or weight loss. He does not report any chest pain, palpitation, orthopnea or leg edema. He does not report any cough, wheezing or hemoptysis. He does not report any nausea, vomiting or abdominal pain. He does not report any constipation or diarrhea. He does not report any frequency urgency or hesitancy. Remaining review of systems unremarkable.    Past Medical History:  Diagnosis Date  . Atrial fibrillation ([place])   . Diverticulosis of colon (without mention of hemorrhage)   . Hyperlipemia   . Hypertension   . Melanoma ([place])   . Personal history of colonic polyps 1999 & 2004   adenomatous polyps  . Ventricular hypertrophy   :  Past Surgical History:  Procedure Laterality Date  . KNEE SURGERY     right  . PILONIDAL CYST DRAINAGE    . schwanoma tumor lumbar spine    . SPINE SURGERY     tumor removed  . TONSILLECTOMY AND ADENOIDECTOMY    :   Current Outpatient Prescriptions:  .  acetaminophen (TYLENOL) 325 MG tablet, Take 650 mg by mouth every 6 (six) hours as needed for moderate pain (for up to 72 hrs). Document area of discomfort and effectiveness of medication., Disp: , Rfl:  .  atorvastatin (LIPITOR) 20 MG tablet, Take 20 mg by mouth every evening. , Disp: , Rfl:  .  donepezil (ARICEPT) 10 MG tablet, Take 10 mg by mouth at bedtime., Disp: , Rfl:  .  folic acid (FOLVITE) 1 MG tablet, Take 1 tablet (1 mg total) by mouth daily., Disp: , Rfl:  .  hydrochlorothiazide (MICROZIDE) 12.5 MG capsule, Take 12.5 mg by mouth daily., Disp: , Rfl:  .  Tamsulosin HCl (FLOMAX) 0.4 MG CAPS, Take 0.8 mg by mouth daily after supper. , Disp: , Rfl:  .  thiamine 100 MG tablet, Take 1 tablet (100 mg total) by mouth daily., Disp: , Rfl:  .  warfarin (COUMADIN) 5 MG tablet, Take as directed  by coumadin clinic, Disp: , Rfl:  .  warfarin (COUMADIN) 7.5 MG tablet, Take 1 tablet (7.5 mg total) by mouth daily. Or as directed by Coumadin Clinic, Disp: 30 tablet, Rfl: 2:  Allergies  Allergen Reactions  . Penicillins Rash    Has patient had a PCN reaction causing immediate rash, facial/tongue/throat swelling, SOB or lightheadedness with hypotension: unknown Has patient had a PCN reaction causing severe rash involving mucus membranes or skin necrosis: unknown Has patient had a PCN reaction that required hospitalization : unknown Has patient had a  PCN reaction occurring within the last 10 years: no If all of the above answers are "NO", then may proceed with Cephalosporin use.   :  Family History  Problem Relation Age of Onset  . Stroke Father   . Neuropathy Neg Hx   :  Social History   Social History  . Marital status: Married    Spouse name: N/A  . Number of children: 2  . Years of education: N/A   Occupational History  . retired    Social History Main Topics  . Smoking status: Former Smoker    Packs/day: 3.00    Types: Cigarettes    Quit date: 06/09/1975  . Smokeless tobacco: Never Used  . Alcohol use 0.0 oz/week     Comment: 2 drinks per day  . Drug use: No  . Sexual activity: Not on file   Other Topics Concern  . Not on file   Social History Narrative   Patient is married with 2 children    Patient is retired    Patient lives at [facility]   :  Pertinent items are noted in HPI.  Exam: Blood pressure 128/66, pulse 66, temperature 98.8 F (37.1 C), temperature source Oral, resp. rate 17, height '6\' 7"'  (2.007 m), weight 262 lb 3.2 oz (118.9 kg), SpO2 96 %. General appearance: alert and cooperative Head: Normocephalic, without obvious abnormality Throat: lips, mucosa, and tongue normal; teeth and gums normal Neck: no adenopathy Back: negative Resp: clear to auscultation bilaterally Chest wall: no tenderness Cardio: regular rate and rhythm, S1, S2 normal, no murmur, click, rub or gallop GI: soft, non-tender; bowel sounds normal; no masses,  no organomegaly Extremities: extremities normal, atraumatic, no cyanosis or edema Pulses: 2+ and symmetric  CBC    Component Value Date/Time   WBC 3.2 (L) 03/14/2016 0607   RBC 4.34 03/14/2016 0607   HGB 13.4 03/14/2016 0607   HCT 39.8 03/14/2016 0607   PLT 61 (L) 03/14/2016 0607   MCV 91.7 03/14/2016 0607   MCH 30.9 03/14/2016 0607   MCHC 33.7 03/14/2016 0607   RDW 14.9 03/14/2016 0607   LYMPHSABS 0.7 03/14/2016 0607   MONOABS 0.2 03/14/2016 0607    EOSABS 0.0 03/14/2016 0607   BASOSABS 0.0 03/14/2016 0607     Chemistry      Component Value Date/Time   NA 142 03/27/2016   K 4.2 03/27/2016   CL 106 03/14/2016 0607   CO2 26 03/14/2016 0607   BUN 11 03/27/2016   CREATININE 0.9 03/27/2016   CREATININE 0.89 03/14/2016 0607      Component Value Date/Time   CALCIUM 9.2 03/14/2016 0607   ALKPHOS 50 03/12/2016 1025   AST 31 03/12/2016 1025   ALT 17 03/12/2016 1025   BILITOT 1.6 (H) 03/12/2016 1025     IMPRESSION: 1. No acute findings identified within the abdomen or pelvis. 2. Nonobstructing left renal calculus. 3. Indeterminate, intermediate attenuating structure arising from the upper  pole of right kidney. This could represent a hyperdense cyst or solid renal mass. Further investigation with nonemergent renal mass protocol CT or MRI may be considered. 4. Aortic atherosclerosis  Assessment and Plan:   82 year old gentleman with the following issues:  1. Thrombocytopenia detected during his hospitalization in September 2017. At that time his platelet count was as low as 61 that appears to have improved up to 86 with repeat testing October 2017. His CBC is essentially unremarkable at this time. He is completely asymptomatic from these findings.  The differential diagnosis was discussed today. Immune thrombocytopenia as a possibility in this particular setting. Medication, infection and other reactive findings could also be contributory. Myelodysplastic syndrome is also a consideration given his age but considered less likely. Lymphoma, leukemia, DIC, TTP, HUS, HIT are considered unlikely. Splenic sequestration related to liver disease would be also less likely. CT scan on 03/11/2016 did not suggest splenomegaly or liver cirrhosis.  From a management standpoint, I see no need for any acute intervention at this time. His for count is more than adequate and his risk of bleeding is very low even on Coumadin. I have recommended continued  active surveillance and I will repeat his platelet counts in 4 months. Trial of steroids could be considered if his blood counts drop below 50. Bone marrow biopsy could also be a consideration if his other counts drop as well. It is very possible that continued observation would be the only management.  2. Leukopenia: Appears to be fluctuating and have basically normalized at this time. His differential is normal which indicate likely benign findings.  3. Kidney mass: This was detected incidentally. He'll continue to follow with Dr. Diona Fanti.  4. Follow-up: Will be in 4 months to repeat these counts and a clinical visit.

## 2016-05-23 ENCOUNTER — Ambulatory Visit (INDEPENDENT_AMBULATORY_CARE_PROVIDER_SITE_OTHER): Payer: Medicare Other | Admitting: Interventional Cardiology

## 2016-05-23 ENCOUNTER — Encounter: Payer: Self-pay | Admitting: Interventional Cardiology

## 2016-05-23 VITALS — BP 142/64 | HR 65 | Ht 79.0 in | Wt 261.1 lb

## 2016-05-23 DIAGNOSIS — I4821 Permanent atrial fibrillation: Secondary | ICD-10-CM

## 2016-05-23 DIAGNOSIS — E784 Other hyperlipidemia: Secondary | ICD-10-CM | POA: Diagnosis not present

## 2016-05-23 DIAGNOSIS — I482 Chronic atrial fibrillation: Secondary | ICD-10-CM | POA: Diagnosis not present

## 2016-05-23 DIAGNOSIS — I10 Essential (primary) hypertension: Secondary | ICD-10-CM | POA: Diagnosis not present

## 2016-05-23 DIAGNOSIS — Z7901 Long term (current) use of anticoagulants: Secondary | ICD-10-CM | POA: Diagnosis not present

## 2016-05-23 DIAGNOSIS — E7849 Other hyperlipidemia: Secondary | ICD-10-CM

## 2016-05-23 NOTE — Patient Instructions (Signed)

## 2016-05-23 NOTE — Progress Notes (Signed)
Cardiology Office Note    Date:  05/23/2016   ID:  Peter Singh, DOB 20-May-1932, MRN [id number]  PCP:  Precious Reel, MD  Cardiologist: Sinclair Grooms, MD   Chief Complaint  Patient presents with  . Atrial Fibrillation    History of Present Illness:  Peter Singh is a 82 y.o. male who presents for chronic atrial fibrillation, chronic anticoagulation therapy, essential hypertension, chronic stable diastolic heart failure.    Recent episode of urosepsis. Also noted to have low platelet count in the 60,000 range. No cardiopulmonary complaints. No acute cardiac issues during the episode of sepsis. EKGs were unremarkable. No bleeding on Coumadin.   Past Medical History:  Diagnosis Date  . Atrial fibrillation ([place])   . Diverticulosis of colon (without mention of hemorrhage)   . Hyperlipemia   . Hypertension   . Melanoma ([place])   . Personal history of colonic polyps 1999 & 2004   adenomatous polyps  . Ventricular hypertrophy     Past Surgical History:  Procedure Laterality Date  . KNEE SURGERY     right  . PILONIDAL CYST DRAINAGE    . schwanoma tumor lumbar spine    . SPINE SURGERY     tumor removed  . TONSILLECTOMY AND ADENOIDECTOMY      Current Medications: Outpatient Medications Prior to Visit  Medication Sig Dispense Refill  . acetaminophen (TYLENOL) 325 MG tablet Take 650 mg by mouth every 6 (six) hours as needed for moderate pain (for up to 72 hrs). Document area of discomfort and effectiveness of medication.    Marland Kitchen atorvastatin (LIPITOR) 20 MG tablet Take 20 mg by mouth every evening.     . donepezil (ARICEPT) 10 MG tablet Take 10 mg by mouth at bedtime.    . folic acid (FOLVITE) 1 MG tablet Take 1 tablet (1 mg total) by mouth daily.    . hydrochlorothiazide (MICROZIDE) 12.5 MG capsule Take 12.5 mg by mouth daily.    . Tamsulosin HCl (FLOMAX) 0.4 MG CAPS Take 0.8 mg by mouth daily after supper.     . thiamine 100 MG tablet Take 1 tablet (100 mg total) by  mouth daily.    Marland Kitchen warfarin (COUMADIN) 5 MG tablet Take as directed by coumadin clinic    . warfarin (COUMADIN) 7.5 MG tablet Take 1 tablet (7.5 mg total) by mouth daily. Or as directed by Coumadin Clinic 30 tablet 2   No facility-administered medications prior to visit.      Allergies:   Penicillins   Social History   Social History  . Marital status: Married    Spouse name: N/A  . Number of children: 2  . Years of education: N/A   Occupational History  . retired    Social History Main Topics  . Smoking status: Former Smoker    Packs/day: 3.00    Types: Cigarettes    Quit date: 06/09/1975  . Smokeless tobacco: Never Used  . Alcohol use 0.0 oz/week     Comment: 2 drinks per day  . Drug use: No  . Sexual activity: Not Asked   Other Topics Concern  . None   Social History Narrative   Patient is married with 2 children    Patient is retired    Patient lives at [facility] History:  The patient's family history includes Stroke in his father.   ROS:   Please see the history of present illness.    Numbness  in hands and feet. Difficulty with ambulation because of numbness. Difficulty with balance. Requires a walker.  All other systems reviewed and are negative.   PHYSICAL EXAM:   VS:  BP (!) 142/64   Pulse 65   Ht 6\' 7"  (2.007 m)   Wt 261 lb 1.9 oz (118.4 kg)   BMI 29.42 kg/m    GEN: Well nourished, well developed, in no acute distress  HEENT: normal  Neck: no JVD, carotid bruits, or masses Cardiac: IIRR; no murmurs, rubs, or gallops,no edema  Respiratory:  clear to auscultation bilaterally, normal work of breathing GI: soft, nontender, nondistended, + BS MS: no deformity or atrophy  Skin: warm and dry, no rash Neuro:  Alert and Oriented x 3, Strength and sensation are intact Psych: euthymic mood, full affect  Wt Readings from Last 3 Encounters:  05/23/16 261 lb 1.9 oz (118.4 kg)  05/22/16 262 lb 3.2 oz (118.9 kg)  04/04/16 257 lb (116.6 kg)        Studies/Labs Reviewed:   EKG:  EKG  Atrial fibrillation with controlled rate and poor R-wave progression on tracing 03/11/16.  Recent Labs: 03/12/2016: ALT 17 03/14/2016: Hemoglobin 13.4; Platelets 61 03/27/2016: BUN 11; Creatinine 0.9; Potassium 4.2; Sodium 142   Lipid Panel No results found for: CHOL, TRIG, HDL, CHOLHDL, VLDL, LDLCALC, LDLDIRECT  Additional studies/ records that were reviewed today include:  No new data    ASSESSMENT:    1. Permanent atrial fibrillation ([place])   2. Essential hypertension   3. Other hyperlipidemia   4. Chronic anticoagulation      PLAN:  In order of problems listed above:  1. Control rate. No change needed. 2. Very good blood pressure control. 3. Followed by primary care 4. Followed by primary care. Low platelet count in Coumadin needs to be followed closely to avoid excessive bleeding risk.    Medication Adjustments/Labs and Tests Ordered: Current medicines are reviewed at length with the patient today.  Concerns regarding medicines are outlined above.  Medication changes, Labs and Tests ordered today are listed in the Patient Instructions below. There are no Patient Instructions on file for this visit.   Signed, Sinclair Grooms, MD  05/23/2016 3:10 PM    [facility] [place], [place], [place]  [address] Phone: [phone]; Fax: [phone]

## 2016-06-03 ENCOUNTER — Ambulatory Visit (INDEPENDENT_AMBULATORY_CARE_PROVIDER_SITE_OTHER): Payer: Medicare Other | Admitting: Cardiology

## 2016-06-03 DIAGNOSIS — I4891 Unspecified atrial fibrillation: Secondary | ICD-10-CM | POA: Diagnosis not present

## 2016-06-03 DIAGNOSIS — Z5181 Encounter for therapeutic drug level monitoring: Secondary | ICD-10-CM | POA: Diagnosis not present

## 2016-06-03 LAB — POCT INR: INR: 3.3

## 2016-06-05 DIAGNOSIS — L111 Transient acantholytic dermatosis [Grover]: Secondary | ICD-10-CM | POA: Diagnosis not present

## 2016-06-05 DIAGNOSIS — C4441 Basal cell carcinoma of skin of scalp and neck: Secondary | ICD-10-CM | POA: Diagnosis not present

## 2016-06-05 DIAGNOSIS — D225 Melanocytic nevi of trunk: Secondary | ICD-10-CM | POA: Diagnosis not present

## 2016-06-05 DIAGNOSIS — D485 Neoplasm of uncertain behavior of skin: Secondary | ICD-10-CM | POA: Diagnosis not present

## 2016-06-05 DIAGNOSIS — L821 Other seborrheic keratosis: Secondary | ICD-10-CM | POA: Diagnosis not present

## 2016-06-05 DIAGNOSIS — Z85828 Personal history of other malignant neoplasm of skin: Secondary | ICD-10-CM | POA: Diagnosis not present

## 2016-06-05 DIAGNOSIS — L814 Other melanin hyperpigmentation: Secondary | ICD-10-CM | POA: Diagnosis not present

## 2016-06-05 DIAGNOSIS — D1801 Hemangioma of skin and subcutaneous tissue: Secondary | ICD-10-CM | POA: Diagnosis not present

## 2016-06-05 DIAGNOSIS — C44622 Squamous cell carcinoma of skin of right upper limb, including shoulder: Secondary | ICD-10-CM | POA: Diagnosis not present

## 2016-06-05 DIAGNOSIS — Z85820 Personal history of malignant melanoma of skin: Secondary | ICD-10-CM | POA: Diagnosis not present

## 2016-06-13 DIAGNOSIS — R3914 Feeling of incomplete bladder emptying: Secondary | ICD-10-CM | POA: Diagnosis not present

## 2016-06-13 DIAGNOSIS — N401 Enlarged prostate with lower urinary tract symptoms: Secondary | ICD-10-CM | POA: Diagnosis not present

## 2016-06-13 DIAGNOSIS — C641 Malignant neoplasm of right kidney, except renal pelvis: Secondary | ICD-10-CM | POA: Diagnosis not present

## 2016-06-13 DIAGNOSIS — D3000 Benign neoplasm of unspecified kidney: Secondary | ICD-10-CM | POA: Diagnosis not present

## 2016-06-17 ENCOUNTER — Ambulatory Visit (INDEPENDENT_AMBULATORY_CARE_PROVIDER_SITE_OTHER): Payer: Medicare Other | Admitting: Cardiology

## 2016-06-17 DIAGNOSIS — I4891 Unspecified atrial fibrillation: Secondary | ICD-10-CM

## 2016-06-17 DIAGNOSIS — I482 Chronic atrial fibrillation: Secondary | ICD-10-CM | POA: Diagnosis not present

## 2016-06-17 DIAGNOSIS — Z5181 Encounter for therapeutic drug level monitoring: Secondary | ICD-10-CM | POA: Diagnosis not present

## 2016-06-17 LAB — PROTIME-INR: INR: 3.4 — AB (ref 0.9–1.1)

## 2016-07-01 DIAGNOSIS — I4891 Unspecified atrial fibrillation: Secondary | ICD-10-CM | POA: Diagnosis not present

## 2016-07-01 DIAGNOSIS — Z7901 Long term (current) use of anticoagulants: Secondary | ICD-10-CM | POA: Diagnosis not present

## 2016-07-01 DIAGNOSIS — Z5181 Encounter for therapeutic drug level monitoring: Secondary | ICD-10-CM | POA: Diagnosis not present

## 2016-07-01 LAB — PROTIME-INR: INR: 2.2 — AB (ref 0.9–1.1)

## 2016-07-02 ENCOUNTER — Ambulatory Visit (INDEPENDENT_AMBULATORY_CARE_PROVIDER_SITE_OTHER): Payer: Medicare Other | Admitting: Internal Medicine

## 2016-07-02 DIAGNOSIS — I4891 Unspecified atrial fibrillation: Secondary | ICD-10-CM

## 2016-07-18 DIAGNOSIS — K8020 Calculus of gallbladder without cholecystitis without obstruction: Secondary | ICD-10-CM | POA: Diagnosis not present

## 2016-07-18 DIAGNOSIS — C641 Malignant neoplasm of right kidney, except renal pelvis: Secondary | ICD-10-CM | POA: Diagnosis not present

## 2016-07-22 ENCOUNTER — Ambulatory Visit (INDEPENDENT_AMBULATORY_CARE_PROVIDER_SITE_OTHER): Payer: Medicare Other | Admitting: Internal Medicine

## 2016-07-22 DIAGNOSIS — I4891 Unspecified atrial fibrillation: Secondary | ICD-10-CM

## 2016-07-22 DIAGNOSIS — Z5181 Encounter for therapeutic drug level monitoring: Secondary | ICD-10-CM | POA: Diagnosis not present

## 2016-07-22 LAB — PROTIME-INR: INR: 2.2 — AB (ref 0.9–1.1)

## 2016-08-05 ENCOUNTER — Other Ambulatory Visit: Payer: Self-pay | Admitting: Interventional Cardiology

## 2016-08-19 DIAGNOSIS — I4891 Unspecified atrial fibrillation: Secondary | ICD-10-CM | POA: Diagnosis not present

## 2016-08-19 DIAGNOSIS — Z7901 Long term (current) use of anticoagulants: Secondary | ICD-10-CM | POA: Diagnosis not present

## 2016-08-19 LAB — PROTIME-INR: INR: 1.6 — AB (ref 0.9–1.1)

## 2016-08-21 ENCOUNTER — Ambulatory Visit (INDEPENDENT_AMBULATORY_CARE_PROVIDER_SITE_OTHER): Payer: Medicare Other | Admitting: Cardiology

## 2016-08-21 DIAGNOSIS — I4891 Unspecified atrial fibrillation: Secondary | ICD-10-CM

## 2016-08-22 DIAGNOSIS — L821 Other seborrheic keratosis: Secondary | ICD-10-CM | POA: Diagnosis not present

## 2016-08-22 DIAGNOSIS — D225 Melanocytic nevi of trunk: Secondary | ICD-10-CM | POA: Diagnosis not present

## 2016-08-22 DIAGNOSIS — Z85820 Personal history of malignant melanoma of skin: Secondary | ICD-10-CM | POA: Diagnosis not present

## 2016-08-22 DIAGNOSIS — L570 Actinic keratosis: Secondary | ICD-10-CM | POA: Diagnosis not present

## 2016-08-22 DIAGNOSIS — Z85828 Personal history of other malignant neoplasm of skin: Secondary | ICD-10-CM | POA: Diagnosis not present

## 2016-08-22 DIAGNOSIS — C4441 Basal cell carcinoma of skin of scalp and neck: Secondary | ICD-10-CM | POA: Diagnosis not present

## 2016-08-22 DIAGNOSIS — C4442 Squamous cell carcinoma of skin of scalp and neck: Secondary | ICD-10-CM | POA: Diagnosis not present

## 2016-08-22 DIAGNOSIS — L814 Other melanin hyperpigmentation: Secondary | ICD-10-CM | POA: Diagnosis not present

## 2016-09-02 DIAGNOSIS — Z85820 Personal history of malignant melanoma of skin: Secondary | ICD-10-CM | POA: Diagnosis not present

## 2016-09-02 DIAGNOSIS — C4442 Squamous cell carcinoma of skin of scalp and neck: Secondary | ICD-10-CM | POA: Diagnosis not present

## 2016-09-02 DIAGNOSIS — Z85828 Personal history of other malignant neoplasm of skin: Secondary | ICD-10-CM | POA: Diagnosis not present

## 2016-09-04 DIAGNOSIS — Z7901 Long term (current) use of anticoagulants: Secondary | ICD-10-CM | POA: Diagnosis not present

## 2016-09-04 DIAGNOSIS — I4891 Unspecified atrial fibrillation: Secondary | ICD-10-CM | POA: Diagnosis not present

## 2016-09-05 ENCOUNTER — Ambulatory Visit (INDEPENDENT_AMBULATORY_CARE_PROVIDER_SITE_OTHER): Payer: Medicare Other

## 2016-09-05 DIAGNOSIS — I4891 Unspecified atrial fibrillation: Secondary | ICD-10-CM

## 2016-09-05 LAB — PROTIME-INR: INR: 2.3 — AB (ref 0.9–1.1)

## 2016-09-17 ENCOUNTER — Telehealth: Payer: Self-pay | Admitting: Oncology

## 2016-09-17 ENCOUNTER — Ambulatory Visit (HOSPITAL_BASED_OUTPATIENT_CLINIC_OR_DEPARTMENT_OTHER): Payer: Medicare Other | Admitting: Oncology

## 2016-09-17 ENCOUNTER — Other Ambulatory Visit (HOSPITAL_BASED_OUTPATIENT_CLINIC_OR_DEPARTMENT_OTHER): Payer: Medicare Other

## 2016-09-17 VITALS — BP 136/68 | HR 69 | Temp 97.6°F | Resp 18 | Ht 79.0 in | Wt 266.2 lb

## 2016-09-17 DIAGNOSIS — D72819 Decreased white blood cell count, unspecified: Secondary | ICD-10-CM

## 2016-09-17 DIAGNOSIS — D696 Thrombocytopenia, unspecified: Secondary | ICD-10-CM

## 2016-09-17 LAB — COMPREHENSIVE METABOLIC PANEL
ALBUMIN: 4 g/dL (ref 3.5–5.0)
ALT: 21 U/L (ref 0–55)
AST: 22 U/L (ref 5–34)
Alkaline Phosphatase: 77 U/L (ref 40–150)
Anion Gap: 8 mEq/L (ref 3–11)
BUN: 18.1 mg/dL (ref 7.0–26.0)
CHLORIDE: 106 meq/L (ref 98–109)
CO2: 30 meq/L — AB (ref 22–29)
Calcium: 10.4 mg/dL (ref 8.4–10.4)
Creatinine: 1.2 mg/dL (ref 0.7–1.3)
EGFR: 54 mL/min/{1.73_m2} — AB (ref >90)
GLUCOSE: 120 mg/dL (ref 70–140)
Potassium: 4.1 mEq/L (ref 3.5–5.1)
SODIUM: 144 meq/L (ref 136–145)
Total Bilirubin: 1.38 mg/dL — ABNORMAL HIGH (ref 0.20–1.20)
Total Protein: 6.6 g/dL (ref 6.4–8.3)

## 2016-09-17 LAB — CBC WITH DIFFERENTIAL/PLATELET
BASO%: 0.3 % (ref 0.0–2.0)
Basophils Absolute: 0 10*3/uL (ref 0.0–0.1)
EOS ABS: 0 10*3/uL (ref 0.0–0.5)
EOS%: 0.3 % (ref 0.0–7.0)
HEMATOCRIT: 49.3 % (ref 38.4–49.9)
HEMOGLOBIN: 17.2 g/dL — AB (ref 13.0–17.1)
LYMPH#: 1 10*3/uL (ref 0.9–3.3)
LYMPH%: 36.4 % (ref 14.0–49.0)
MCH: 32.1 pg (ref 27.2–33.4)
MCHC: 34.9 g/dL (ref 32.0–36.0)
MCV: 92.1 fL (ref 79.3–98.0)
MONO#: 0.4 10*3/uL (ref 0.1–0.9)
MONO%: 14 % (ref 0.0–14.0)
NEUT#: 1.4 10*3/uL — ABNORMAL LOW (ref 1.5–6.5)
NEUT%: 49 % (ref 39.0–75.0)
NRBC: 0 % (ref 0–0)
PLATELETS: 91 10*3/uL — AB (ref 140–400)
RBC: 5.35 10*6/uL (ref 4.20–5.82)
RDW: 14 % (ref 11.0–14.6)
WBC: 2.9 10*3/uL — AB (ref 4.0–10.3)

## 2016-09-17 LAB — CHCC SMEAR

## 2016-09-17 LAB — TECHNOLOGIST REVIEW

## 2016-09-17 NOTE — Progress Notes (Signed)
Hematology and Oncology Follow Up Visit  Peter Singh [id number] 12-Dec-1931 83 y.o. 09/17/2016 8:24 AM Precious Reel, [place], [name], MD   Principle Diagnosis: 83 year old gentleman with thrombocytopenia diagnosed in September 2017.  He presented with a platelet count of 61. Platelet count has fluctuated since that time between 80 and 90. 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 Other causes are considered less likely.   Current therapy: Observation and surveillance.  Interim History:  Peter Singh is as today for a follow-up visit. Since the last visit, he reports no major changes in his health. He remains ambulatory with the help of a walker and didn't have any falls or syncope. He denied any bleeding complications including epistaxis, hematochezia or melena. He remains on warfarin without any changes. He continues to have 2 alcoholic drinks a night which she has done that for years. He denied any bruising or petechiae or rash.  He does not report any headaches, blurry vision, syncope or seizures. He does not report any fevers, chills, sweats or weight loss. He does not report any chest pain, palpitation, orthopnea or leg edema. He does not report any cough, wheezing or hemoptysis. He does not report any nausea, vomiting or abdominal pain. He does not report any constipation or diarrhea. He does not report any frequency urgency or hesitancy. Remaining review of systems unremarkable.    Medications: I have reviewed the patient's current medications.  Current Outpatient Prescriptions  Medication Sig Dispense Refill  . acetaminophen (TYLENOL) 325 MG tablet Take 650 mg by mouth every 6 (six) hours as needed for moderate pain (for up to 72 hrs). Document area of discomfort and effectiveness of medication.    Marland Kitchen atorvastatin (LIPITOR) 20 MG tablet Take 20 mg by mouth every evening.     . donepezil (ARICEPT) 10 MG tablet Take 10 mg by mouth at bedtime.    .  folic acid (FOLVITE) 1 MG tablet Take 1 tablet (1 mg total) by mouth daily.    . hydrochlorothiazide (MICROZIDE) 12.5 MG capsule Take 12.5 mg by mouth daily.    . Tamsulosin HCl (FLOMAX) 0.4 MG CAPS Take 0.8 mg by mouth daily after supper.     . thiamine 100 MG tablet Take 1 tablet (100 mg total) by mouth daily.    Marland Kitchen warfarin (COUMADIN) 5 MG tablet Take as directed by coumadin clinic    . warfarin (COUMADIN) 7.5 MG tablet TAKE 1 TABLET DAILY OR AS DIRECTED. 30 tablet 3   No current facility-administered medications for this visit.      Allergies:  Allergies  Allergen Reactions  . Penicillins Rash    Has patient had a PCN reaction causing immediate rash, facial/tongue/throat swelling, SOB or lightheadedness with hypotension: unknown Has patient had a PCN reaction causing severe rash involving mucus membranes or skin necrosis: unknown Has patient had a PCN reaction that required hospitalization : unknown Has patient had a PCN reaction occurring within the last 10 years: no If all of the above answers are "NO", then may proceed with Cephalosporin use.     Past Medical History, Surgical history, Social history, and Family History were reviewed and updated.   Physical Exam: Blood pressure 136/68, pulse 69, temperature 97.6 F (36.4 C), temperature source Oral, resp. rate 18, height _0  (2.007 m), weight 266 lb 3.2 oz (120.7 kg), SpO2 98 %. ECOG: 1 General appearance: alert and cooperative appeared without distress. Head: Normocephalic, without obvious abnormality without oral ulcers or thrush. Neck: no  adenopathy Lymph nodes: Cervical, supraclavicular, and axillary nodes normal. Heart:regular rate and rhythm, S1, S2 normal, no murmur, click, rub or gallop Lung:chest clear, no wheezing, rales, normal symmetric air entry Abdomin: soft, non-tender, without masses or organomegaly EXT:no erythema, induration, or nodules Skin showed no rashes or petechiae.  Lab Results: Lab Results   Component Value Date   WBC 2.9 (L) 09/17/2016   HGB 17.2 (H) 09/17/2016   HCT 49.3 09/17/2016   MCV 92.1 09/17/2016   PLT 91 (L) 09/17/2016     Chemistry      Component Value Date/Time   NA 142 03/27/2016   K 4.2 03/27/2016   CL 106 03/14/2016 0607   CO2 26 03/14/2016 0607   BUN 11 03/27/2016   CREATININE 0.9 03/27/2016   CREATININE 0.89 03/14/2016 0607      Component Value Date/Time   CALCIUM 9.2 03/14/2016 0607   ALKPHOS 50 03/12/2016 1025   AST 31 03/12/2016 1025   ALT 17 03/12/2016 1025   BILITOT 1.6 (H) 03/12/2016 1025         Impression and Plan:  83 year old gentleman with the following issues:  1. Thrombocytopenia: This has been mild and fluctuating since 2017. His platelet counts have ranged as well as 61,000 and today they are 91,000. The differential diagnosis was reviewed again and this could be attributed to alcohol consumption, ITP or early myelodysplastic syndrome.  Liver disease and splenic sequestration could also be a possibility given his history of persistent alcohol intake over the years. I see no evidence to suggest primary hematological disorder at this time.  Further management standpoint, I have recommended continued observation and surveillance at this time. His thrombocytopenia is rather mild and asymptomatic.  I plan to repeat his counts and 6 months and consider bone marrow biopsy if he develops worsening cytopenias.  2. Leukocytopenia with a normal differential: This could be related to liver disease and alcohol consumption versus early myelodysplastic syndrome. I recommended continued monitoring at this time. Bone marrow biopsy would be recommended if she develops worsening counts in the future.    [facility], MD 3/14/20188:24 AM

## 2016-09-17 NOTE — Telephone Encounter (Signed)
Gave patient AVS and calender per 09/17/2016 los

## 2016-09-22 DIAGNOSIS — R3914 Feeling of incomplete bladder emptying: Secondary | ICD-10-CM | POA: Diagnosis not present

## 2016-09-22 DIAGNOSIS — R8271 Bacteriuria: Secondary | ICD-10-CM | POA: Diagnosis not present

## 2016-09-25 DIAGNOSIS — I4891 Unspecified atrial fibrillation: Secondary | ICD-10-CM | POA: Diagnosis not present

## 2016-09-25 DIAGNOSIS — Z7901 Long term (current) use of anticoagulants: Secondary | ICD-10-CM | POA: Diagnosis not present

## 2016-09-25 LAB — PROTIME-INR: INR: 2.3 — AB (ref 0.9–1.1)

## 2016-09-26 ENCOUNTER — Ambulatory Visit (INDEPENDENT_AMBULATORY_CARE_PROVIDER_SITE_OTHER): Payer: Medicare Other | Admitting: Cardiovascular Disease

## 2016-09-26 DIAGNOSIS — N3001 Acute cystitis with hematuria: Secondary | ICD-10-CM | POA: Diagnosis not present

## 2016-09-26 DIAGNOSIS — I4891 Unspecified atrial fibrillation: Secondary | ICD-10-CM

## 2016-09-26 DIAGNOSIS — R310 Gross hematuria: Secondary | ICD-10-CM | POA: Diagnosis not present

## 2016-10-07 ENCOUNTER — Inpatient Hospital Stay (HOSPITAL_COMMUNITY)
Admission: EM | Admit: 2016-10-07 | Discharge: 2016-10-10 | DRG: 872 | Disposition: A | Discharge status: Home Health | Payer: Medicare Other | Attending: Internal Medicine | Admitting: Internal Medicine

## 2016-10-07 ENCOUNTER — Emergency Department (HOSPITAL_COMMUNITY): Payer: Medicare Other

## 2016-10-07 ENCOUNTER — Encounter (HOSPITAL_COMMUNITY): Payer: Self-pay | Admitting: Nurse Practitioner

## 2016-10-07 DIAGNOSIS — Z7901 Long term (current) use of anticoagulants: Secondary | ICD-10-CM | POA: Diagnosis not present

## 2016-10-07 DIAGNOSIS — F1010 Alcohol abuse, uncomplicated: Secondary | ICD-10-CM | POA: Diagnosis present

## 2016-10-07 DIAGNOSIS — N390 Urinary tract infection, site not specified: Secondary | ICD-10-CM | POA: Diagnosis present

## 2016-10-07 DIAGNOSIS — I481 Persistent atrial fibrillation: Secondary | ICD-10-CM | POA: Diagnosis not present

## 2016-10-07 DIAGNOSIS — D696 Thrombocytopenia, unspecified: Secondary | ICD-10-CM | POA: Diagnosis present

## 2016-10-07 DIAGNOSIS — F0390 Unspecified dementia without behavioral disturbance: Secondary | ICD-10-CM | POA: Diagnosis present

## 2016-10-07 DIAGNOSIS — N179 Acute kidney failure, unspecified: Secondary | ICD-10-CM | POA: Diagnosis present

## 2016-10-07 DIAGNOSIS — Z87891 Personal history of nicotine dependence: Secondary | ICD-10-CM | POA: Diagnosis not present

## 2016-10-07 DIAGNOSIS — Z66 Do not resuscitate: Secondary | ICD-10-CM | POA: Diagnosis present

## 2016-10-07 DIAGNOSIS — G609 Hereditary and idiopathic neuropathy, unspecified: Secondary | ICD-10-CM | POA: Diagnosis not present

## 2016-10-07 DIAGNOSIS — E785 Hyperlipidemia, unspecified: Secondary | ICD-10-CM | POA: Diagnosis present

## 2016-10-07 DIAGNOSIS — Z86010 Personal history of colonic polyps: Secondary | ICD-10-CM | POA: Diagnosis not present

## 2016-10-07 DIAGNOSIS — A419 Sepsis, unspecified organism: Secondary | ICD-10-CM | POA: Diagnosis not present

## 2016-10-07 DIAGNOSIS — I119 Hypertensive heart disease without heart failure: Secondary | ICD-10-CM | POA: Diagnosis present

## 2016-10-07 DIAGNOSIS — B961 Klebsiella pneumoniae [K. pneumoniae] as the cause of diseases classified elsewhere: Secondary | ICD-10-CM | POA: Diagnosis present

## 2016-10-07 DIAGNOSIS — I10 Essential (primary) hypertension: Secondary | ICD-10-CM | POA: Diagnosis not present

## 2016-10-07 DIAGNOSIS — R7989 Other specified abnormal findings of blood chemistry: Secondary | ICD-10-CM | POA: Diagnosis not present

## 2016-10-07 DIAGNOSIS — Z880 Allergy status to penicillin: Secondary | ICD-10-CM | POA: Diagnosis not present

## 2016-10-07 DIAGNOSIS — E784 Other hyperlipidemia: Secondary | ICD-10-CM | POA: Diagnosis not present

## 2016-10-07 DIAGNOSIS — Z85820 Personal history of malignant melanoma of skin: Secondary | ICD-10-CM

## 2016-10-07 DIAGNOSIS — T368X5A Adverse effect of other systemic antibiotics, initial encounter: Secondary | ICD-10-CM | POA: Diagnosis not present

## 2016-10-07 DIAGNOSIS — Z79899 Other long term (current) drug therapy: Secondary | ICD-10-CM | POA: Diagnosis not present

## 2016-10-07 DIAGNOSIS — R404 Transient alteration of awareness: Secondary | ICD-10-CM | POA: Diagnosis not present

## 2016-10-07 DIAGNOSIS — I4891 Unspecified atrial fibrillation: Secondary | ICD-10-CM | POA: Diagnosis present

## 2016-10-07 DIAGNOSIS — Z87440 Personal history of urinary (tract) infections: Secondary | ICD-10-CM | POA: Diagnosis not present

## 2016-10-07 DIAGNOSIS — J9811 Atelectasis: Secondary | ICD-10-CM | POA: Diagnosis not present

## 2016-10-07 DIAGNOSIS — N3000 Acute cystitis without hematuria: Secondary | ICD-10-CM | POA: Diagnosis not present

## 2016-10-07 DIAGNOSIS — R509 Fever, unspecified: Secondary | ICD-10-CM | POA: Diagnosis not present

## 2016-10-07 DIAGNOSIS — Z7983 Long term (current) use of bisphosphonates: Secondary | ICD-10-CM

## 2016-10-07 DIAGNOSIS — R531 Weakness: Secondary | ICD-10-CM | POA: Diagnosis not present

## 2016-10-07 DIAGNOSIS — R05 Cough: Secondary | ICD-10-CM | POA: Diagnosis not present

## 2016-10-07 LAB — URINALYSIS, ROUTINE W REFLEX MICROSCOPIC
BILIRUBIN URINE: NEGATIVE
GLUCOSE, UA: 50 mg/dL — AB
KETONES UR: 5 mg/dL — AB
NITRITE: NEGATIVE
PH: 5 (ref 5.0–8.0)
PROTEIN: 100 mg/dL — AB
Specific Gravity, Urine: 1.018 (ref 1.005–1.030)

## 2016-10-07 LAB — CBC WITH DIFFERENTIAL/PLATELET
BASOS PCT: 0 %
Basophils Absolute: 0 10*3/uL (ref 0.0–0.1)
EOS ABS: 0 10*3/uL (ref 0.0–0.7)
Eosinophils Relative: 0 %
HEMATOCRIT: 46.2 % (ref 39.0–52.0)
HEMOGLOBIN: 16.4 g/dL (ref 13.0–17.0)
Lymphocytes Relative: 2 %
Lymphs Abs: 0.1 10*3/uL — ABNORMAL LOW (ref 0.7–4.0)
MCH: 31.8 pg (ref 26.0–34.0)
MCHC: 35.5 g/dL (ref 30.0–36.0)
MCV: 89.7 fL (ref 78.0–100.0)
MONOS PCT: 3 %
Monocytes Absolute: 0.2 10*3/uL (ref 0.1–1.0)
NEUTROS ABS: 6 10*3/uL (ref 1.7–7.7)
NEUTROS PCT: 95 %
Platelets: 64 10*3/uL — ABNORMAL LOW (ref 150–400)
RBC: 5.15 MIL/uL (ref 4.22–5.81)
RDW: 14 % (ref 11.5–15.5)
WBC: 6.3 10*3/uL (ref 4.0–10.5)

## 2016-10-07 LAB — I-STAT CG4 LACTIC ACID, ED
LACTIC ACID, VENOUS: 3.46 mmol/L — AB (ref 0.5–1.9)
Lactic Acid, Venous: 3.36 mmol/L (ref 0.5–1.9)

## 2016-10-07 LAB — COMPREHENSIVE METABOLIC PANEL
ALBUMIN: 4.3 g/dL (ref 3.5–5.0)
ALT: 22 U/L (ref 17–63)
ANION GAP: 10 (ref 5–15)
AST: 35 U/L (ref 15–41)
Alkaline Phosphatase: 57 U/L (ref 38–126)
BILIRUBIN TOTAL: 2.8 mg/dL — AB (ref 0.3–1.2)
BUN: 16 mg/dL (ref 6–20)
CHLORIDE: 105 mmol/L (ref 101–111)
CO2: 23 mmol/L (ref 22–32)
Calcium: 9.8 mg/dL (ref 8.9–10.3)
Creatinine, Ser: 1.11 mg/dL (ref 0.61–1.24)
GFR calc Af Amer: >60 mL/min (ref >60)
GFR calc non Af Amer: 60 mL/min — ABNORMAL LOW (ref >60)
GLUCOSE: 161 mg/dL — AB (ref 65–99)
POTASSIUM: 3.8 mmol/L (ref 3.5–5.1)
Sodium: 138 mmol/L (ref 135–145)
TOTAL PROTEIN: 6.9 g/dL (ref 6.5–8.1)

## 2016-10-07 LAB — INFLUENZA PANEL BY PCR (TYPE A & B)
INFLBPCR: NEGATIVE
Influenza A By PCR: NEGATIVE

## 2016-10-07 LAB — PROTIME-INR
INR: 1.82
PROTHROMBIN TIME: 21.3 s — AB (ref 11.4–15.2)

## 2016-10-07 LAB — MRSA PCR SCREENING: MRSA by PCR: NEGATIVE

## 2016-10-07 LAB — LIPASE, BLOOD: LIPASE: 20 U/L (ref 11–51)

## 2016-10-07 MED ORDER — ACETAMINOPHEN 325 MG PO TABS: 650.0000 mg | ORAL_TABLET | Freq: Four times a day (QID) | ORAL | Status: DC | PRN

## 2016-10-07 MED ORDER — SODIUM CHLORIDE 0.9 % IV BOLUS (SEPSIS)
1000.0000 mL | Freq: Once | INTRAVENOUS | Status: AC
  Administered 2016-10-07: 1000 mL via INTRAVENOUS

## 2016-10-07 MED ORDER — SODIUM CHLORIDE 0.9 % IV BOLUS (SEPSIS)
500.0000 mL | Freq: Once | INTRAVENOUS | Status: AC
  Administered 2016-10-07: 500 mL via INTRAVENOUS

## 2016-10-07 MED ORDER — ACETAMINOPHEN 650 MG RE SUPP: 650.0000 mg | Freq: Four times a day (QID) | RECTAL | Status: DC | PRN

## 2016-10-07 MED ORDER — SODIUM CHLORIDE 0.9 % IV BOLUS (SEPSIS): 1000.0000 mL | Freq: Once | INTRAVENOUS | Status: DC

## 2016-10-07 MED ORDER — SODIUM CHLORIDE 0.9% FLUSH
3.0000 mL | Freq: Two times a day (BID) | INTRAVENOUS | Status: DC
  Administered 2016-10-07 – 2016-10-09 (×4): 3 mL via INTRAVENOUS

## 2016-10-07 MED ORDER — WARFARIN - PHARMACIST DOSING INPATIENT
Freq: Every day | Status: DC
  Administered 2016-10-08: 18:00:00

## 2016-10-07 MED ORDER — TAMSULOSIN HCL 0.4 MG PO CAPS
0.8000 mg | ORAL_CAPSULE | Freq: Every day | ORAL | Status: DC
  Administered 2016-10-07 – 2016-10-09 (×3): 0.8 mg via ORAL
  Filled 2016-10-07 (×3): qty 2

## 2016-10-07 MED ORDER — VANCOMYCIN HCL IN DEXTROSE 1-5 GM/200ML-% IV SOLN: 1000.0000 mg | Freq: Once | INTRAVENOUS | Status: DC

## 2016-10-07 MED ORDER — PIPERACILLIN-TAZOBACTAM 3.375 G IVPB 30 MIN
3.3750 g | Freq: Once | INTRAVENOUS | Status: AC
  Administered 2016-10-07: 3.375 g via INTRAVENOUS
  Filled 2016-10-07: qty 50

## 2016-10-07 MED ORDER — ATORVASTATIN CALCIUM 20 MG PO TABS
20.0000 mg | ORAL_TABLET | Freq: Every day | ORAL | Status: DC
  Administered 2016-10-07 – 2016-10-09 (×3): 20 mg via ORAL
  Filled 2016-10-07: qty 1
  Filled 2016-10-07: qty 2
  Filled 2016-10-07: qty 1

## 2016-10-07 MED ORDER — BISACODYL 10 MG RE SUPP: 10.0000 mg | Freq: Every day | RECTAL | Status: DC | PRN

## 2016-10-07 MED ORDER — ONDANSETRON HCL 4 MG PO TABS: 4.0000 mg | ORAL_TABLET | Freq: Four times a day (QID) | ORAL | Status: DC | PRN

## 2016-10-07 MED ORDER — VITAMIN B-1 100 MG PO TABS
100.0000 mg | ORAL_TABLET | Freq: Every day | ORAL | Status: DC
  Administered 2016-10-07 – 2016-10-10 (×4): 100 mg via ORAL
  Filled 2016-10-07 (×4): qty 1

## 2016-10-07 MED ORDER — ALBUTEROL SULFATE (2.5 MG/3ML) 0.083% IN NEBU: 2.5000 mg | INHALATION_SOLUTION | RESPIRATORY_TRACT | Status: DC | PRN

## 2016-10-07 MED ORDER — VANCOMYCIN HCL 10 G IV SOLR
1250.0000 mg | INTRAVENOUS | Status: DC
  Administered 2016-10-08: 1250 mg via INTRAVENOUS
  Filled 2016-10-07 (×2): qty 1250

## 2016-10-07 MED ORDER — FOLIC ACID 1 MG PO TABS
1.0000 mg | ORAL_TABLET | Freq: Every day | ORAL | Status: DC
  Administered 2016-10-07 – 2016-10-10 (×4): 1 mg via ORAL
  Filled 2016-10-07 (×4): qty 1

## 2016-10-07 MED ORDER — ACETAMINOPHEN 500 MG PO TABS
1000.0000 mg | ORAL_TABLET | Freq: Once | ORAL | Status: AC
  Administered 2016-10-07: 1000 mg via ORAL
  Filled 2016-10-07: qty 2

## 2016-10-07 MED ORDER — SENNOSIDES-DOCUSATE SODIUM 8.6-50 MG PO TABS: 1.0000 | ORAL_TABLET | Freq: Every evening | ORAL | Status: DC | PRN

## 2016-10-07 MED ORDER — PIPERACILLIN-TAZOBACTAM 3.375 G IVPB
3.3750 g | Freq: Three times a day (TID) | INTRAVENOUS | Status: DC
  Administered 2016-10-07 – 2016-10-09 (×5): 3.375 g via INTRAVENOUS
  Filled 2016-10-07 (×6): qty 50

## 2016-10-07 MED ORDER — LORAZEPAM 2 MG/ML IJ SOLN
2.0000 mg | INTRAMUSCULAR | Status: DC | PRN
  Administered 2016-10-07 – 2016-10-08 (×2): 2 mg via INTRAVENOUS
  Filled 2016-10-07 (×3): qty 1

## 2016-10-07 MED ORDER — ADULT MULTIVITAMIN W/MINERALS CH
1.0000 | ORAL_TABLET | Freq: Every day | ORAL | Status: DC
Start: 2016-10-07 — End: 2016-10-10
  Administered 2016-10-07 – 2016-10-10 (×4): 1 via ORAL
  Filled 2016-10-07 (×4): qty 1

## 2016-10-07 MED ORDER — ONDANSETRON HCL 4 MG/2ML IJ SOLN: 4.0000 mg | Freq: Four times a day (QID) | INTRAMUSCULAR | Status: DC | PRN

## 2016-10-07 MED ORDER — DONEPEZIL HCL 10 MG PO TABS
10.0000 mg | ORAL_TABLET | Freq: Every day | ORAL | Status: DC
  Administered 2016-10-08 – 2016-10-10 (×3): 10 mg via ORAL
  Filled 2016-10-07 (×3): qty 1

## 2016-10-07 MED ORDER — VANCOMYCIN HCL 10 G IV SOLR
2000.0000 mg | Freq: Once | INTRAVENOUS | Status: AC
  Administered 2016-10-07: 2000 mg via INTRAVENOUS
  Filled 2016-10-07: qty 2000

## 2016-10-07 MED ORDER — SODIUM CHLORIDE 0.9 % IV SOLN
INTRAVENOUS | Status: AC
  Administered 2016-10-07 – 2016-10-08 (×2): via INTRAVENOUS

## 2016-10-07 NOTE — ED Provider Notes (Signed)
[facility] DEPT Provider Note   CSN: [id number] Arrival date & time: 10/07/16  1318     History   Chief Complaint Chief Complaint  Patient presents with  . Fever  . Weakness    HPI Peter Singh is a 83 y.o. male.  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 [facility] with wife. Wife is at bedside to get story. States that patient was seen last week by urology for blood in his urine. Patient does self cath every day. At that time it was likely due to trauma from self cathing along with UTI. He was started on doxycycline which she took for 7 days and stopped 3 days ago. Wife also reports that patient started developing a cough with rhinorrhea yesterday. He is also had intermittent fevers unsure of MAXIMUM TEMPERATURE. Patient did have several episodes of nausea and emesis this morning. The patient reports feeling generally weak but denies any specific complaints. Wife does state the patient seems more lethargic and confused which is not his baseline. Specifically, the patient is a vague historian states that he does not have any abdominal pain, chest pain, shortness of breath, headache, vision changes, change in bowel habits, numbness/tingling.  Past Medical History:  Diagnosis Date  . Atrial fibrillation ([place])   . Diverticulosis of colon (without mention of hemorrhage)   . Hyperlipemia   . Hypertension   . Melanoma ([place])   . Personal history of colonic polyps 1999 & 2004   adenomatous polyps  . Ventricular hypertrophy     Patient Active Problem List   Diagnosis Date Noted  . Sepsis ([place]) 03/11/2016  . Hereditary and idiopathic peripheral neuropathy 07/06/2014  . Essential hypertension 03/24/2014  . Hyperlipidemia  03/24/2014  . Chronic anticoagulation 03/24/2014  . Atrial fibrillation ([place]) 05/17/2013    Past Surgical History:  Procedure Laterality Date  . KNEE SURGERY     right  . PILONIDAL CYST DRAINAGE    . schwanoma tumor lumbar spine    . SPINE SURGERY     tumor removed  . TONSILLECTOMY AND ADENOIDECTOMY         Home Medications    Prior to Admission medications   Medication Sig Start Date End Date Taking? Authorizing Provider  acetaminophen (TYLENOL) 325 MG tablet Take 650 mg by mouth every 6 (six) hours as needed for moderate pain (for up to 72 hrs). Document area of discomfort and effectiveness of medication.    Historical Provider, MD  atorvastatin (LIPITOR) 20 MG tablet Take 20 mg by mouth every evening.     Historical Provider, MD  donepezil (ARICEPT) 10 MG tablet Take 10 mg by mouth at bedtime.    Historical Provider, MD  folic acid (FOLVITE) 1 MG tablet Take 1 tablet (1 mg total) by mouth daily. 03/14/16   Kinnie Feil, MD  hydrochlorothiazide (MICROZIDE) 12.5 MG capsule Take 12.5 mg by mouth daily.    Historical Provider, MD  Tamsulosin HCl (FLOMAX) 0.4 MG CAPS Take 0.8 mg by mouth daily after supper.     Historical Provider, MD  thiamine 100 MG tablet Take 1 tablet (100 mg total) by mouth daily. 03/14/16   Kinnie Feil, MD  vitamin B-12 (CYANOCOBALAMIN) 1000 MCG tablet Take 1,000 mcg by mouth daily.    Historical Provider, MD  warfarin (COUMADIN) 5 MG tablet Take as directed by coumadin clinic    Historical Provider, MD  warfarin (COUMADIN) 7.5 MG tablet TAKE 1 TABLET DAILY OR AS DIRECTED. 08/05/16   [name], MD    Family History Family History  Problem Relation Age of Onset  . Stroke Father   . Neuropathy Neg Hx     Social History Social History  Substance Use Topics  . Smoking status: Former Smoker    Packs/day: 3.00    Types: Cigarettes    Quit date: 06/09/1975  . Smokeless tobacco: Never Used  . Alcohol use 0.0 oz/week     Comment: 2 drinks per day       Allergies   Penicillins   Review of Systems Review of Systems  Constitutional: Positive for chills. Negative for fever.  HENT: Positive for congestion and rhinorrhea. Negative for sore throat.   Eyes: Negative for visual disturbance.  Respiratory: Positive for cough. Negative for shortness of breath and wheezing.   Cardiovascular: Negative for chest pain.  Gastrointestinal: Positive for nausea and vomiting. Negative for abdominal pain and diarrhea.  Genitourinary: Positive for dysuria, frequency, hematuria and urgency. Negative for scrotal swelling and testicular pain.  Musculoskeletal: Negative for neck pain and neck stiffness.  Skin: Negative.   Neurological: Negative for dizziness, syncope, weakness, light-headedness and numbness.  Psychiatric/Behavioral: Positive for confusion.     Physical Exam Updated Vital Signs BP (!) 116/59   Pulse 90   Temp (!) 104.3 F (40.2 C) (Rectal)   Resp (!) 23   Ht 6\' 6"  (1.981 m)   Wt 113.4 kg   SpO2 94%   BMI 28.89 kg/m   Physical Exam  Constitutional: He is oriented to person, place, and time. He appears well-developed and well-nourished. No distress.  Nontoxic-appearing.  HENT:  Head: Normocephalic and atraumatic.  Mouth/Throat: Oropharynx is clear and moist.  Eyes: Conjunctivae are normal. Right eye exhibits no discharge. Left eye exhibits no discharge. No scleral icterus.  Neck: Normal range of motion. Neck supple. No thyromegaly present.  Cardiovascular: Normal rate and intact distal pulses.  An irregularly irregular rhythm present. Exam reveals distant heart sounds. Exam reveals no gallop and no friction rub.   No murmur heard. Pulmonary/Chest: Effort normal. No respiratory distress. He has no decreased breath sounds. He has wheezes (scattered). He has no rhonchi. He has no rales. He exhibits no tenderness.  Abdominal: Soft. Bowel sounds are normal. He exhibits no distension. There is tenderness. There is no rebound and no  guarding.  Mild suprapubic tenderness to palpation without any rebound or guarding. No rigidity.  Musculoskeletal: Normal range of motion.  Lymphadenopathy:    He has no cervical adenopathy.  Neurological: He is alert and oriented to person, place, and time.  Mildly lethargic.  Skin: Skin is warm and dry. Capillary refill takes less than 2 seconds.  Nursing note and vitals reviewed.    ED Treatments / Results  Labs (all labs ordered are listed, but only abnormal results are displayed) Labs Reviewed  COMPREHENSIVE METABOLIC PANEL - Abnormal; Notable for the following:       Result Value   Glucose, Bld 161 (*)    Total Bilirubin 2.8 (*)    GFR calc non Af Amer 60 (*)    All other components within normal limits  CBC WITH DIFFERENTIAL/PLATELET - Abnormal; Notable for the following:    Platelets 64 (*)    Lymphs Abs 0.1 (*)    All other components within  normal limits  PROTIME-INR - Abnormal; Notable for the following:    Prothrombin Time 21.3 (*)    All other components within normal limits  URINALYSIS, ROUTINE W REFLEX MICROSCOPIC - Abnormal; Notable for the following:    Color, Urine AMBER (*)    APPearance CLOUDY (*)    Glucose, UA 50 (*)    Hgb urine dipstick LARGE (*)    Ketones, ur 5 (*)    Protein, ur 100 (*)    Leukocytes, UA LARGE (*)    Bacteria, UA MANY (*)    Squamous Epithelial / LPF 0-5 (*)    All other components within normal limits  I-STAT CG4 LACTIC ACID, ED - Abnormal; Notable for the following:    Lactic Acid, Venous 3.46 (*)    All other components within normal limits  CULTURE, BLOOD (ROUTINE X 2)  CULTURE, BLOOD (ROUTINE X 2)  LIPASE, BLOOD  INFLUENZA PANEL BY PCR (TYPE A & B)  I-STAT CG4 LACTIC ACID, ED  I-STAT CG4 LACTIC ACID, ED  I-STAT CG4 LACTIC ACID, ED    EKG  EKG Interpretation  Date/Time:  Tuesday October 07 2016 13:37:12 EDT Ventricular Rate:  108 PR Interval:    QRS Duration: 100 QT Interval:  334 QTC Calculation: 448 R  Axis:   -74 Text Interpretation:  Atrial fibrillation Left anterior fascicular block Consider anterior infarct No significant change since last tracing Confirmed by Winfred Leeds  MD, SAM [phone]) on 10/07/2016 2:14:26 PM Also confirmed by Winfred Leeds  MD, SAM [phone]), editor Drema Pry [phone])  on 10/07/2016 2:36:01 PM       Radiology Dg Chest 2 View  Result Date: 10/07/2016 CLINICAL DATA:  Fever, weakness EXAM: CHEST  2 VIEW COMPARISON:  03/11/2016 FINDINGS: Cardiomegaly again noted. Limited study by poor inspiration. No segmental infiltrate or pulmonary edema. Mild basilar atelectasis. Degenerative changes mid and lower thoracic spine IMPRESSION: Limited study by poor inspiration. Mild basilar atelectasis. Cardiomegaly. No segmental infiltrate or pulmonary edema. Electronically Signed   By: Lahoma Crocker M.D.   On: 10/07/2016 14:46    Procedures Procedures (including critical care time)  Medications Ordered in ED Medications  vancomycin (VANCOCIN) 2,000 mg in sodium chloride 0.9 % 500 mL IVPB (2,000 mg Intravenous New Bag/Given 10/07/16 1456)  vancomycin (VANCOCIN) 1,250 mg in sodium chloride 0.9 % 250 mL IVPB (not administered)  piperacillin-tazobactam (ZOSYN) IVPB 3.375 g (not administered)  sodium chloride 0.9 % bolus 1,000 mL (1,000 mLs Intravenous New Bag/Given 10/07/16 1447)    And  sodium chloride 0.9 % bolus 1,000 mL (1,000 mLs Intravenous New Bag/Given 10/07/16 1518)    And  sodium chloride 0.9 % bolus 500 mL (0 mLs Intravenous Stopped 10/07/16 1518)  piperacillin-tazobactam (ZOSYN) IVPB 3.375 g (0 g Intravenous Stopped 10/07/16 1518)  acetaminophen (TYLENOL) tablet 1,000 mg (1,000 mg Oral Given 10/07/16 1456)     Initial Impression / Assessment and Plan / ED Course  I have reviewed the triage vital signs and the nursing notes.  Pertinent labs & imaging results that were available during my care of the patient were reviewed by me and considered in my medical decision making (see chart  for details).     Patient presents to the ED with family at bedside with complaints of fever, nausea, vomiting, urinary symptoms, lethargy, confusion. Patient has known UTI and seen by urology last week. Started on doxycycline. Patient does self cath. Patient does have scattered wheezes on exam with cough and URI symptoms started yesterday. Chest x-ray showed  no signs of focal infiltrate. Influenza was negative. Patient with mild suprapubic tenderness without any rebound or guarding. Patient is febrile ED of 104.3. Heart rate was 97 initially. Blood pressure were normal. Lactate was 3.5. Sepsis was called due to the elevated temperature, heart rate, and elevated resp rate. Patient was given 30 mL per KG fluid bolus. He was started on broad-spectrum antibiotics including Zosyn and zinc. Blood cultures were obtained prior to antibiotic initiation. Urine does show signs of infection with large amount of hemoglobin, leukocytes, WBCs, bacteria. Patient without leukocytosis. Thrombocytopenia which is baseline.  Lipase is normal. Electrolytes normal. Patient is on Coumadin for A. fib. EKG shows A. fib without any acute changes. INR is normal. He is therapeutic on coumadin.   Spoke with Dr. Algis Liming [facility] who agrees to admit pt. Pt was seen and examined by Dr. Winfred Leeds who is agreeable to the above plan. Pt is hemodynamically stable at this time and up dated on plan of care.   CRITICAL CARE Performed by: Ocie Cornfield   Total critical care time: 30 minutes  Critical care time was exclusive of separately billable procedures and treating other patients.  Critical care was necessary to treat or prevent imminent or life-threatening deterioration.  Critical care was time spent personally by me on the following activities: development of treatment plan with patient and/or surrogate as well as nursing, discussions with consultants, evaluation of patient's response to treatment,  examination of patient, obtaining history from patient or surrogate, ordering and performing treatments and interventions, ordering and review of laboratory studies, ordering and review of radiographic studies, pulse oximetry and re-evaluation of patient's condition.   Final Clinica h.l Impressions(s) / ED Diagnoses   Final diagnoses:  Sepsis, due to unspecified organism [facility]    New Prescriptions Current Discharge Medication List       Doristine Devoid, PA-C 10/08/16 [place], MD 10/08/16 [phone]

## 2016-10-07 NOTE — Progress Notes (Addendum)
ANTICOAGULATION CONSULT NOTE - Initial Consult  Pharmacy Consult for Warfarin Indication: atrial fibrillation  Allergies  Allergen Reactions  . Penicillins Rash    Has patient had a PCN reaction causing immediate rash, facial/tongue/throat swelling, SOB or lightheadedness with hypotension: unknown Has patient had a PCN reaction causing severe rash involving mucus membranes or skin necrosis: unknown Has patient had a PCN reaction that required hospitalization : unknown Has patient had a PCN reaction occurring within the last 10 years: no If all of the above answers are "NO", then may proceed with Cephalosporin use.     Patient Measurements: Height: 6\' 6"  (198.1 cm) Weight: 250 lb (113.4 kg) IBW/kg (Calculated) : 91.4   Vital Signs: Temp: 104.3 F (40.2 C) (04/03 1344) Temp Source: Rectal (04/03 1344) BP: 113/67 (04/03 1746) Pulse Rate: 98 (04/03 1746)  Labs:  Recent Labs  10/07/16 1405  HGB 16.4  HCT 46.2  PLT 64*  LABPROT 21.3*  INR 1.82  CREATININE 1.11    Estimated Creatinine Clearance: 72.7 mL/min (by C-G formula based on SCr of 1.11 mg/dL).   Medical History: Past Medical History:  Diagnosis Date  . Atrial fibrillation ([place])   . Diverticulosis of colon (without mention of hemorrhage)   . Hyperlipemia   . Hypertension   . Melanoma ([place])   . Personal history of colonic polyps 1999 & 2004   adenomatous polyps  . Ventricular hypertrophy     Assessment:  89 male known to pharmacy from Vancomycin and Zosyn dosing  PTA patient on warfarin for PMH including AFib  INR upon admission = 1.82  Patient's home regimen of warfarin: 7.5mg  daily except 3.75mg  on Sunday, Tuesday & Fridays  Patient reports taking meds daily around 18:00; however pt notes forgot to take Warfarin on 4/2 and took that day's dose (7.5mg ) on 4/3 at 0700  Goal of Therapy:  INR 2-3   Plan:   INR slightly subtherapeutic; however pt took 7.5mg  already today.  Will not give any  further warfarin tonight  F/U AM INR and dose warfarin accordingly  Daily INR  Stina Gane, Toribio Harbour, PharmD 10/07/2016,7:39 PM

## 2016-10-07 NOTE — Progress Notes (Signed)
Pharmacy Antibiotic Note  Peter Singh is a 83 y.o. male admitted on 10/07/2016 with sepsis.  Patient with c/o weakness, fever and elevated heart rate.  Pharmacy has been consulted for Vancomycin & Zosyn dosing.  - SCr reported by lab = 1.11 - CrCl (n) = 52 ml/min  Plan:  Vancomycin 2000mg  IV x 1 dose followed by 1250mg  IV q24h  Zosyn 3.375gm IV q8h (each dose infused over 4 hrs)  Check daily SCr while on Vancomycin & Zosyn  F/U cultures/sensitivities, renal function, clinical course  Height: 6\' 6"  (198.1 cm) Weight: 250 lb (113.4 kg) IBW/kg (Calculated) : 91.4  Temp (24hrs), Avg:104.3 F (40.2 C), Min:104.3 F (40.2 C), Max:104.3 F (40.2 C)  No results for input(s): WBC, CREATININE, LATICACIDVEN, VANCOTROUGH, VANCOPEAK, VANCORANDOM, GENTTROUGH, GENTPEAK, GENTRANDOM, TOBRATROUGH, TOBRAPEAK, TOBRARND, AMIKACINPEAK, AMIKACINTROU, AMIKACIN in the last 168 hours.  Estimated Creatinine Clearance: 67.3 mL/min (by C-G formula based on SCr of 1.2 mg/dL).    Allergies  Allergen Reactions  . Penicillins Rash    Has patient had a PCN reaction causing immediate rash, facial/tongue/throat swelling, SOB or lightheadedness with hypotension: unknown Has patient had a PCN reaction causing severe rash involving mucus membranes or skin necrosis: unknown Has patient had a PCN reaction that required hospitalization : unknown Has patient had a PCN reaction occurring within the last 10 years: no If all of the above answers are "NO", then may proceed with Cephalosporin use.     Antimicrobials this admission: 4/3 Vanc >>   4/3 Zosyn >>    Dose adjustments this admission:    Microbiology results: 4/3 BCx: sent  Thank you for allowing pharmacy to be a part of this patient's care.  Everette Rank, PharmD 10/07/2016 3:45 PM

## 2016-10-07 NOTE — ED Provider Notes (Addendum)
Patient has vague historian He reports feeling generally weak for "several days" he is alert and awake. HEENT exam no facial asymmetry mucous membranes dry neck supple lungs Rales posteriorly abdomen nondistended normoactive bowel sounds minimal tenderness in suprapubic area no guarding rigidity or rebound genitalia normal male extremities without edema skin warm dry. Code sepsis called based on Sirs criteria fever heart rate. Source of infection at this point unclear   Orlie Dakin, MD 10/07/16 [place], MD 10/08/16 [phone]

## 2016-10-07 NOTE — H&P (Addendum)
History and Physical    Peter Singh [id number]:[id number] DOB: 03/08/1932 DOA: 10/07/2016  PCP: Peter Reel, MD   I have briefly reviewed patients previous medical reports in [facility].  Patient coming from: Home/independent living  Chief Complaint: Fever, weakness and confusion  HPI: Peter Singh is a 83 year old married male, lives at an independent living facility, ambulates with the help of a walker, PMH of urinary problems including recurrent UTIs, self catheterizes once daily at bedtime (Dr. Luberta Robertson, Urology), A. fib on Coumadin, Dementia, thrombocytopenia, HTN, HLD, alcohol use, presented to the ED with complaints of high fever, weakness and confusion. He was seen at the urologist's office 10 days ago for hematuria which was probably related to trauma from self-catheterization but he was called the next day then advised that he had a UTI despite no other symptoms and he completed 7 days course of oral doxycycline. Hematuria improved but has not fully resolved. He was in his usual state of health until last night and even went out to dinner with his family. Overnight he was restless and did not sleep well. This morning he had 3 episodes of nausea with nonbloody emesis, fever off 101.68F orally at home, generalized weakness and confusion. He denied dysuria or urinary frequency. He urinates by himself but self catheterizes himself once every night due to incomplete bladder emptying as per urology's recommendations. He denied headache, earache, sore throat, chest pain, dyspnea, cough, abdominal pain, diarrhea, constipation, skin rash or open wounds. After treatment in the ED, he feels better and as per his family, his mental status is almost back to baseline.  ED Course: Patient met sepsis criteria including fever of 104.67F, respiratory rate of 23/m, likely urinary source based on urine microscopy and elevated lactate of 3.46. He was treated per sepsis protocol with IV fluids and  empirically started on IV vancomycin & Zosyn. Hospitalist admission was requested.  Review of Systems:  All other systems reviewed and apart from HPI, are negative.  Past Medical History:  Diagnosis Date  . Atrial fibrillation ([place])   . Diverticulosis of colon (without mention of hemorrhage)   . Hyperlipemia   . Hypertension   . Melanoma ([place])   . Personal history of colonic polyps 1999 & 2004   adenomatous polyps  . Ventricular hypertrophy     Past Surgical History:  Procedure Laterality Date  . KNEE SURGERY     right  . PILONIDAL CYST DRAINAGE    . schwanoma tumor lumbar spine    . SPINE SURGERY     tumor removed  . TONSILLECTOMY AND ADENOIDECTOMY      Social History  reports that he quit smoking about 41 years ago. His smoking use included Cigarettes. He smoked 3.00 packs per day. He has never used smokeless tobacco. He reports that he drinks alcohol. He reports that he does not use drugs.  Allergies  Allergen Reactions  . Penicillins Rash    Has patient had a PCN reaction causing immediate rash, facial/tongue/throat swelling, SOB or lightheadedness with hypotension: unknown Has patient had a PCN reaction causing severe rash involving mucus membranes or skin necrosis: unknown Has patient had a PCN reaction that required hospitalization : unknown Has patient had a PCN reaction occurring within the last 10 years: no If all of the above answers are "NO", then may proceed with Cephalosporin use.     Family History  Problem Relation Age of Onset  . Stroke Father   . Neuropathy Neg Hx  Prior to Admission medications   Medication Sig Start Date End Date Taking? Authorizing Provider  acetaminophen (TYLENOL) 325 MG tablet Take 650 mg by mouth every 6 (six) hours as needed for moderate pain (for up to 72 hrs). Document area of discomfort and effectiveness of medication.    Historical Provider, MD  atorvastatin (LIPITOR) 20 MG tablet Take 20 mg by mouth every evening.      Historical Provider, MD  donepezil (ARICEPT) 10 MG tablet Take 10 mg by mouth at bedtime.    Historical Provider, MD  folic acid (FOLVITE) 1 MG tablet Take 1 tablet (1 mg total) by mouth daily. 03/14/16   Kinnie Feil, MD  hydrochlorothiazide (MICROZIDE) 12.5 MG capsule Take 12.5 mg by mouth daily.    Historical Provider, MD  Tamsulosin HCl (FLOMAX) 0.4 MG CAPS Take 0.8 mg by mouth daily after supper.     Historical Provider, MD  thiamine 100 MG tablet Take 1 tablet (100 mg total) by mouth daily. 03/14/16   Kinnie Feil, MD  vitamin B-12 (CYANOCOBALAMIN) 1000 MCG tablet Take 1,000 mcg by mouth daily.    Historical Provider, MD  warfarin (COUMADIN) 5 MG tablet Take as directed by coumadin clinic    Historical Provider, MD  warfarin (COUMADIN) 7.5 MG tablet TAKE 1 TABLET DAILY OR AS DIRECTED. 08/05/16   [name], MD    Physical Exam: Vitals:   10/07/16 1337 10/07/16 1344 10/07/16 1345 10/07/16 1600  BP:  (!) 145/93  (!) 116/59  Pulse:  97  90  Resp:  20  (!) 23  Temp:  (!) 104.3 F (40.2 C)    TempSrc:  Rectal    SpO2: 97% 95%  94%  Weight:  120.7 kg (266 lb) 113.4 kg (250 lb)   Height:  _0  (2.007 m) _1  (1.981 m)       Constitutional: Pleasant elderly male, well built and moderately nourished, lying comfortably propped up in the gurney in the ED. Eyes: PERTLA, lids and conjunctivae normal ENMT: Mucous membranes are dry. Posterior pharynx clear of any exudate or lesions. Normal dentition.  Neck: supple, no masses, no thyromegaly Respiratory: clear to auscultation bilaterally, no wheezing, no crackles. Normal respiratory effort. No accessory muscle use.  Cardiovascular: S1 & S2 heard, irregularly irregular, no murmurs / rubs / gallops. No extremity edema. 2+ pedal pulses. No carotid bruits. Telemetry: A. fib with controlled ventricular rate. Abdomen: No distension, no masses palpated. No hepatosplenomegaly. Bowel sounds normal. Minimal suprapubic tenderness without  peritoneal signs. Musculoskeletal: no clubbing / cyanosis. No joint deformity upper and lower extremities. Good ROM, no contractures. Normal muscle tone.  Skin: no rashes, lesions, ulcers. No induration Neurologic: CN 2-12 grossly intact. Sensation intact, DTR normal. Strength 5/5 in all 4 limbs.  Psychiatric: Normal judgment and insight. Alert and oriented x 3. Normal mood.     Labs on Admission: I have personally reviewed following labs and imaging studies  CBC:  Recent Labs Lab 10/07/16 1405  WBC 6.3  NEUTROABS 6.0  HGB 16.4  HCT 46.2  MCV 89.7  PLT 64*   Basic Metabolic Panel:  Recent Labs Lab 10/07/16 1405  NA 138  K 3.8  CL 105  CO2 23  GLUCOSE 161*  BUN 16  CREATININE 1.11  CALCIUM 9.8   Liver Function Tests:  Recent Labs Lab 10/07/16 1405  AST 35  ALT 22  ALKPHOS 57  BILITOT 2.8*  PROT 6.9  ALBUMIN 4.3   Coagulation Profile:  Recent Labs Lab 10/07/16 1405  INR 1.82   Urine analysis:    Component Value Date/Time   COLORURINE AMBER (A) 10/07/2016 1347   APPEARANCEUR CLOUDY (A) 10/07/2016 1347   LABSPEC 1.018 10/07/2016 1347   PHURINE 5.0 10/07/2016 1347   GLUCOSEU 50 (A) 10/07/2016 1347   HGBUR LARGE (A) 10/07/2016 1347   BILIRUBINUR NEGATIVE 10/07/2016 1347   KETONESUR 5 (A) 10/07/2016 1347   PROTEINUR 100 (A) 10/07/2016 1347   NITRITE NEGATIVE 10/07/2016 1347   LEUKOCYTESUR LARGE (A) 10/07/2016 1347     Radiological Exams on Admission: Dg Chest 2 View  Result Date: 10/07/2016 CLINICAL DATA:  Fever, weakness EXAM: CHEST  2 VIEW COMPARISON:  03/11/2016 FINDINGS: Cardiomegaly again noted. Limited study by poor inspiration. No segmental infiltrate or pulmonary edema. Mild basilar atelectasis. Degenerative changes mid and lower thoracic spine IMPRESSION: Limited study by poor inspiration. Mild basilar atelectasis. Cardiomegaly. No segmental infiltrate or pulmonary edema. Electronically Signed   By: Lahoma Crocker M.D.   On: 10/07/2016 14:46     EKG: Independently reviewed. A. fib that 108 bpm, left anterior fascicular block, Q waves in inferior leads, slow R-wave progression and QTC 448 ms. No significant change since prior EKG.  Assessment/Plan Principal Problem:   Sepsis secondary to UTI (HCC)/complicated UTI - Most likely due to incomplete bladder emptying and self-catheterization at home. Gives history of recurrent UTI. Recently treated by his urologist with a weeks course of doxycycline (do not have access to office urine culture results). - Continue empirically started IV vancomycin and Zosyn pending urine culture results.  -  Low index of suspicion for pneumonia in the absence of symptoms or chest x-ray findings. - Please alert patient's urologist in a.m. off current admission and access to office urine culture results.   Active Problems:   Atrial fibrillation (HCC) - Controlled ventricular rate. INR 1.8. Does not seem to be on rate control medications at home (pharmacy home med rec pending)  - Continue warfarin per pharmacy     Essential hypertension - Controlled. Given sepsis physiology, hold HCTZ temporarily. Monitor closely     Hyperlipidemia - Resume home medications after pharmacy has reconciled home medications     Chronic anticoagulation - As above.     Hereditary and idiopathic peripheral neuropathy/dementia - Continue home medications. Follows with outpatient neurology.  - Confusion was most likely related to sepsis/UTI and as per family has almost resolved.    Thrombocytopenia ([address])  - Follow CBCs closely. As per outpatient hematologist, his thrombocytopenia may be related to alcohol consumption, ITP or early myelodysplastic syndrome but continued observation at this time. Follow CBC in a.m.     Alcohol use/? Abuse - As per spouse's report, patient drinks 2 bourbons drinks every night. At risk for alcohol withdrawal. Place on CIWA protocol and monitor closely.      DVT prophylaxis: Coumadin per  pharmacy  Code Status: DO NOT RESUSCITATE. This was confirmed with patient in the presence of his spouse, daughter and granddaughter.  Family Communication: Discussed in detail with patient's family including his spouse, daughter and granddaughter at bedside. Updated care and answered questions.   Disposition Plan: Admit to stepdown unit. DC home when medically improved and stable, possibly in 3-4 days.   Consults called: None   Admission status: Inpatient, stepdown unit. Based on patient's acuity of illness and expected course, it is felt that he will need at least [facility] stay for evaluation and management.     [facility] MD [facility] Pager [phone]  If 7PM-7AM, please contact night-coverage [email or web address] Password [facility]  10/07/2016, 5:31 PM

## 2016-10-07 NOTE — ED Triage Notes (Signed)
PT RECEIVED FROM [facility], LIVES WITH FAMILY VIA EMS C/O FEVER AND WEAKNESS. PER EMS THE HAD FEVER, WEAKNESS, AND NAUSEA X1 WEEK. PT IS A DNR WITH A MOST FORM. DENIES CHEST OR ABDOMINAL PAIN, BUT HAD SOME CONFUSION AND UNSTEADY GAIT. BP 126/76, HR 108 IRREG, O2 97% R/A, AND CBG 194.

## 2016-10-07 NOTE — ED Notes (Signed)
Bed: YL69 Expected date:  Expected time:  Means of arrival:  Comments: EMS-fever/weak

## 2016-10-07 NOTE — ED Notes (Signed)
Spoke with Agricultural consultant, Wynetta Emery about bed delay. States he does not have staff at this time to care for patient. Primary RN Ledell Noss made aware and patient has been updated about delay.

## 2016-10-08 DIAGNOSIS — D696 Thrombocytopenia, unspecified: Secondary | ICD-10-CM

## 2016-10-08 DIAGNOSIS — R7989 Other specified abnormal findings of blood chemistry: Secondary | ICD-10-CM

## 2016-10-08 DIAGNOSIS — A419 Sepsis, unspecified organism: Principal | ICD-10-CM

## 2016-10-08 DIAGNOSIS — I481 Persistent atrial fibrillation: Secondary | ICD-10-CM

## 2016-10-08 DIAGNOSIS — N390 Urinary tract infection, site not specified: Secondary | ICD-10-CM

## 2016-10-08 DIAGNOSIS — I10 Essential (primary) hypertension: Secondary | ICD-10-CM

## 2016-10-08 LAB — BASIC METABOLIC PANEL
Anion gap: 7 (ref 5–15)
BUN: 21 mg/dL — AB (ref 6–20)
CO2: 25 mmol/L (ref 22–32)
CREATININE: 1.27 mg/dL — AB (ref 0.61–1.24)
Calcium: 8.6 mg/dL — ABNORMAL LOW (ref 8.9–10.3)
Chloride: 107 mmol/L (ref 101–111)
GFR calc Af Amer: 59 mL/min — ABNORMAL LOW (ref >60)
GFR calc non Af Amer: 51 mL/min — ABNORMAL LOW (ref >60)
GLUCOSE: 121 mg/dL — AB (ref 65–99)
Potassium: 3.8 mmol/L (ref 3.5–5.1)
Sodium: 139 mmol/L (ref 135–145)

## 2016-10-08 LAB — LACTIC ACID, PLASMA: LACTIC ACID, VENOUS: 2.1 mmol/L — AB (ref 0.5–1.9)

## 2016-10-08 LAB — CBC
HCT: 41.7 % (ref 39.0–52.0)
Hemoglobin: 14.1 g/dL (ref 13.0–17.0)
MCH: 31.7 pg (ref 26.0–34.0)
MCHC: 33.8 g/dL (ref 30.0–36.0)
MCV: 93.7 fL (ref 78.0–100.0)
PLATELETS: 62 10*3/uL — AB (ref 150–400)
RBC: 4.45 MIL/uL (ref 4.22–5.81)
RDW: 14.7 % (ref 11.5–15.5)
WBC: 5.2 10*3/uL (ref 4.0–10.5)

## 2016-10-08 LAB — PROTIME-INR
INR: 1.77
Prothrombin Time: 20.8 seconds — ABNORMAL HIGH (ref 11.4–15.2)

## 2016-10-08 MED ORDER — WARFARIN SODIUM 5 MG PO TABS
7.5000 mg | ORAL_TABLET | Freq: Once | ORAL | Status: AC
  Administered 2016-10-08: 7.5 mg via ORAL
  Filled 2016-10-08: qty 1

## 2016-10-08 NOTE — Progress Notes (Addendum)
[facility] PROGRESS NOTE  Peter Singh [id number] DOB: 1931/11/06 DOA: 10/07/2016  PCP: Peter Reel, MD  Brief History/Interval Summary: 83 year old Caucasian male who lives independently living facility ambulating with the help of a walker with past medical history of recurrent UTIs, or the urinary bladder issues requiring self-catheterization, atrial fibrillation on Coumadin, dementia, thrombocytopenia, under evaluation by hematology, presented with fever, weakness, confusion. He recently completed a treatment of oral doxycycline for UTI. He was seen by his urologist for hematuria recently, which has improved. Patient was found to have abnormal UA. He was found to have sepsis with elevated lactic acid level. He was placed on broad-spectrum antibiotics and admitted to the stepdown unit.  Reason for Visit: Urinary tract infection with sepsis  Consultants: None  Procedures: None  Antibiotics: Vancomycin and Zosyn  Subjective/Interval History: Patient feels better this morning. Denies any abdominal pain, nausea or vomiting. Denies any shortness of breath. No cough.  ROS: No headaches  Objective:  Vital Signs  Vitals:   10/08/16 0700 10/08/16 0800 10/08/16 0900 10/08/16 1000  BP:  (!) 132/34    Pulse:  88 80 75  Resp:  (!) 23 (!) 28 (!) 31  Temp: 98 F (36.7 C)     TempSrc: Oral     SpO2:  98% 99% (!) 87%  Weight:      Height:        Intake/Output Summary (Last 24 hours) at 10/08/16 1045 Last data filed at 10/08/16 1000  Gross per 24 hour  Intake             4023 ml  Output              725 ml  Net             3298 ml   Filed Weights   10/07/16 1345 10/07/16 2135 10/08/16 0500  Weight: 113.4 kg (250 lb) 123.8 kg (272 lb 14.9 oz) 125.2 kg (276 lb 0.3 oz)    General appearance: alert, cooperative, appears stated age and no distress Resp: clear to auscultation bilaterally Cardio: regular rate and rhythm, S1, S2 normal, no murmur, click, rub or  gallop GI: soft, non-tender; bowel sounds normal; no masses,  no organomegaly Extremities: extremities normal, atraumatic, no cyanosis or edema Neurologic: Awake and alert. Oriented 3. Cranial nerve II-12 intact. No obvious focal neurological deficits are noted.  Lab Results:  Data Reviewed: I have personally reviewed following labs and imaging studies  CBC:  Recent Labs Lab 10/07/16 1405 10/08/16 0311  WBC 6.3 5.2  NEUTROABS 6.0  --   HGB 16.4 14.1  HCT 46.2 41.7  MCV 89.7 93.7  PLT 64* 62*    Basic Metabolic Panel:  Recent Labs Lab 10/07/16 1405 10/08/16 0311  NA 138 139  K 3.8 3.8  CL 105 107  CO2 23 25  GLUCOSE 161* 121*  BUN 16 21*  CREATININE 1.11 1.27*  CALCIUM 9.8 8.6*    GFR: Estimated Creatinine Clearance: 67.4 mL/min (A) (by C-G formula based on SCr of 1.27 mg/dL (H)).  Liver Function Tests:  Recent Labs Lab 10/07/16 1405  AST 35  ALT 22  ALKPHOS 57  BILITOT 2.8*  PROT 6.9  ALBUMIN 4.3     Recent Labs Lab 10/07/16 1405  LIPASE 20    Coagulation Profile:  Recent Labs Lab 10/07/16 1405 10/08/16 0311  INR 1.82 1.77    Recent Results (from the past 240 hour(s))  MRSA PCR Screening  Status: None   Collection Time: 10/07/16  9:37 PM  Result Value Ref Range Status   MRSA by PCR NEGATIVE NEGATIVE Final    Comment:        The GeneXpert MRSA Assay (FDA approved for NASAL specimens only), is one component of a comprehensive MRSA colonization surveillance program. It is not intended to diagnose MRSA infection nor to guide or monitor treatment for MRSA infections.       Radiology Studies: Dg Chest 2 View  Result Date: 10/07/2016 CLINICAL DATA:  Fever, weakness EXAM: CHEST  2 VIEW COMPARISON:  03/11/2016 FINDINGS: Cardiomegaly again noted. Limited study by poor inspiration. No segmental infiltrate or pulmonary edema. Mild basilar atelectasis. Degenerative changes mid and lower thoracic spine IMPRESSION: Limited study by poor  inspiration. Mild basilar atelectasis. Cardiomegaly. No segmental infiltrate or pulmonary edema. Electronically Signed   By: Lahoma Crocker M.D.   On: 10/07/2016 14:46     Medications:  Scheduled: . atorvastatin  20 mg Oral q1800  . donepezil  10 mg Oral Daily  . folic acid  1 mg Oral Daily  . multivitamin with minerals  1 tablet Oral Daily  . piperacillin-tazobactam (ZOSYN)  IV  3.375 g Intravenous Q8H  . sodium chloride flush  3 mL Intravenous Q12H  . tamsulosin  0.8 mg Oral QPC supper  . thiamine  100 mg Oral Daily  . vancomycin  1,250 mg Intravenous Q24H  . warfarin  7.5 mg Oral ONCE-1800  . Warfarin - Pharmacist Dosing Inpatient   Does not apply q1800   Continuous: . sodium chloride 75 mL/hr at 10/08/16 0804   HYI:FOYDXAJOINOMV **OR** acetaminophen, albuterol, bisacodyl, LORazepam, ondansetron **OR** ondansetron (ZOFRAN) IV, senna-docusate  Assessment/Plan:  Principal Problem:   Sepsis secondary to UTI [facility]) Active Problems:   Atrial fibrillation (HCC)   Essential hypertension   Hyperlipidemia   Chronic anticoagulation   Hereditary and idiopathic peripheral neuropathy   Thrombocytopenia (HCC)    Sepsis secondary to UTI/complicated UTI Most likely due to incomplete bladder emptying and self-catheterization at home. Gives history of recurrent UTI. Recently treated by his urologist with a 1 week course of doxycycline (do not have access to office urine culture results). Continue empirically started IV vancomycin and Zosyn pending urine culture results. Follow up on blood and urine culture. Unfortunately, urine cultures not ordered at the time of initial visit. Ordered today. Lactic acid level has improved. We will call urology office to get results of cultures done last week. Discussed with urology office. Urine culture was sent on March 23. Positive for significant Klebsiella sensitive to amoxicillin, ceftriaxone, Cipro, cefepime. Resistant to tetracycline and Bactrim. Will  change antibiotics tomorrow.   Atrial fibrillation Heart rate is well controlled. Does not appear to be on any rate control medications at home. He is anticoagulated with warfarin. Continue per pharmacy. INR is subtherapeutic.  Elevated creatinine Patient is making urine. Creatinine noted. BP elevated today compared to yesterday. Recheck tomorrow morning. Monitor urine output.  History of Essential hypertension Controlled. Holding HCTZ for now. Monitor closely   History of Hyperlipidemia Continue statin  Chronic anticoagulation As above.   Hereditary and idiopathic peripheral neuropathy/dementia Continue home medications. Follows with outpatient neurology. Confusion was most likely related to sepsis/UTI and appears now to be close to baseline.   Thrombocytopenia Follow CBCs closely. As per outpatient hematologist, his thrombocytopenia may be related to alcohol consumption, ITP or early myelodysplastic syndrome but continued observation at this time. Platelet counts are stable.  Alcohol use/? Abuse As  per spouse's report, patient drinks 2 bourbons drinks every night. Placed on CIWA protocol. No signs or symptoms of withdrawal. Continue thiamine.   DVT Prophylaxis: On warfarin    Code Status: DO NOT RESUSCITATE  Family Communication: No family at bedside  Disposition Plan: Patient seems to stabilize. Okay for transfer to floor. PT evaluation.   LOS: 1 day   [facility] Pager [phone] 10/08/2016, 10:45 AM  If 7PM-7AM, please contact night-coverage at [email or web address], password [facility]

## 2016-10-08 NOTE — Evaluation (Signed)
Physical Therapy Evaluation Patient Details Name: Peter Singh MRN: [id number] DOB: [birth year] Today's Date: 10/08/2016   History of Present Illness  Pt admitted through ed with fever/weakness and found to be septic and transferred to ED  Clinical Impression  Pt admitted as above and presenting with functional mobility limitations 2* generalized weakness and ambulatory balance deficits.  Pt should progress to dc home with assist of family.    Follow Up Recommendations Home health PT    Equipment Recommendations  None recommended by PT    Recommendations for Other Services       Precautions / Restrictions Precautions Precautions: Fall Restrictions Weight Bearing Restrictions: No      Mobility  Bed Mobility Overal bed mobility: Needs Assistance Bed Mobility: Sit to Supine       Sit to supine: Min guard   General bed mobility comments: cues for safety and sequence  Transfers Overall transfer level: Needs assistance Equipment used: Rolling walker (2 wheeled) Transfers: Sit to/from Stand Sit to Stand: Min assist;Mod assist;+2 physical assistance;+2 safety/equipment         General transfer comment: cus for transition position and use of UEs to self assist.  Physical assist to bring wt up and fwd and to correct for posterior drift in initial standing  Ambulation/Gait Ambulation/Gait assistance: Min assist;+2 safety/equipment Ambulation Distance (Feet): 300 Feet Assistive device: Rolling walker (2 wheeled) Gait Pattern/deviations: Step-through pattern;Decreased step length - right;Decreased step length - left;Shuffle;Trunk flexed;Wide base of support     General Gait Details: cues for posture and position from RW  Stairs            Wheelchair Mobility    Modified Rankin (Stroke Patients Only)       Balance Overall balance assessment: Needs assistance Sitting-balance support: No upper extremity supported;Feet supported Sitting balance-Leahy Scale:  Good     Standing balance support: No upper extremity supported Standing balance-Leahy Scale: Fair                               Pertinent Vitals/Pain Pain Assessment: No/denies pain    Home Living Family/patient expects to be discharged to:: Private residence Living Arrangements: Spouse/significant other Available Help at Discharge: Family Type of Home: House Home Access: Level entry     Home Layout: One level Home Equipment: Environmental consultant - 4 wheels Additional Comments: Pt and spouse reside at [facility]    Prior Function Level of Independence: Independent with assistive device(s)               Hand Dominance        Extremity/Trunk Assessment   Upper Extremity Assessment Upper Extremity Assessment: Generalized weakness    Lower Extremity Assessment Lower Extremity Assessment: Generalized weakness       Communication   Communication: No difficulties  Cognition Arousal/Alertness: Awake/alert Behavior During Therapy: WFL for tasks assessed/performed Overall Cognitive Status: History of cognitive impairments - at baseline                                        General Comments      Exercises     Assessment/Plan    PT Assessment Patient needs continued PT services  PT Problem List Decreased strength;Decreased activity tolerance;Decreased balance;Decreased mobility;Decreased knowledge of use of DME       PT Treatment Interventions DME instruction;Gait training;Functional mobility  training;Therapeutic activities;Therapeutic exercise;Balance training;Patient/family education    PT Goals (Current goals can be found in the Care Plan section)  Acute Rehab PT Goals Patient Stated Goal: Get up and move PT Goal Formulation: With patient Time For Goal Achievement: 10/22/16 Potential to Achieve Goals: Good    Frequency Min 3X/week   Barriers to discharge        Co-evaluation               End of Session Equipment  Utilized During Treatment: Gait belt Activity Tolerance: Patient tolerated treatment well Patient left: in bed;with call bell/phone within reach;with bed alarm set;with family/visitor present Nurse Communication: Mobility status PT Visit Diagnosis: Unsteadiness on feet (R26.81);Muscle weakness (generalized) (M62.81)    Time: 6060-0459 PT Time Calculation (min) (ACUTE ONLY): 23 min   Charges:   PT Evaluation $PT Eval Low Complexity: 1 Procedure PT Treatments $Gait Training: 8-22 mins   PT G Codes:          Peter Singh 10/08/2016, 12:59 PM

## 2016-10-08 NOTE — Progress Notes (Signed)
CRITICAL VALUE ALERT  Critical value received:  LA: 2.1  Date of notification:  09/07/16  Time of notification:  0905  Critical value read back:Yes.    Nurse who received alert:  Tessie Eke  MD notified (1st page):  [place] Pink  Time of first page:  [phone]

## 2016-10-08 NOTE — Progress Notes (Signed)
ANTICOAGULATION CONSULT NOTE - follow up  Pharmacy Consult for Warfarin Indication: atrial fibrillation  Allergies  Allergen Reactions  . Penicillins Rash    Has patient had a PCN reaction causing immediate rash, facial/tongue/throat swelling, SOB or lightheadedness with hypotension: unknown Has patient had a PCN reaction causing severe rash involving mucus membranes or skin necrosis: unknown Has patient had a PCN reaction that required hospitalization : unknown Has patient had a PCN reaction occurring within the last 10 years: no If all of the above answers are "NO", then may proceed with Cephalosporin use.     Patient Measurements: Height: 6\' 7"  (200.7 cm) Weight: 276 lb 0.3 oz (125.2 kg) IBW/kg (Calculated) : 93.7   Vital Signs: Temp: 98.9 F (37.2 C) (04/04 0333) Temp Source: Oral (04/04 0333) BP: 132/34 (04/04 0800) Pulse Rate: 88 (04/04 0800)  Labs:  Recent Labs  10/07/16 1405 10/08/16 0311  HGB 16.4 14.1  HCT 46.2 41.7  PLT 64* 62*  LABPROT 21.3* 20.8*  INR 1.82 1.77  CREATININE 1.11 1.27*    Estimated Creatinine Clearance: 67.4 mL/min (A) (by C-G formula based on SCr of 1.27 mg/dL (H)).   Medical History: Past Medical History:  Diagnosis Date  . Atrial fibrillation ([place])   . Diverticulosis of colon (without mention of hemorrhage)   . Hyperlipemia   . Hypertension   . Melanoma ([place])   . Personal history of colonic polyps 1999 & 2004   adenomatous polyps  . Ventricular hypertrophy     Assessment:  53 male known to pharmacy from Vancomycin and Zosyn dosing  PTA patient on warfarin for PMH including AFib  INR upon admission = 1.82  Patient's home regimen of warfarin: 7.5mg  daily except 3.75mg  on Sunday, Tuesday & Fridays  Patient reports taking meds daily around 18:00; however pt notes forgot to take Warfarin on 4/2 and took that day's dose (7.5mg ) on 4/3 at 0700  Today, 10/08/16  INR subtherapeutic this AM  No reported bleeding  CBC  stable  Goal of Therapy:  INR 2-3   Plan:  1) Warfarin 7.5mg  today at 1800 2) Daily INR   Adrian Saran, PharmD, BCPS Pager [phone] 10/08/2016 8:39 AM

## 2016-10-08 NOTE — Care Management Note (Signed)
Case Management Note  Patient Details  Name: [name] MRN: [id number] Date of Birth: October 01, 1931 Sepsis, tempo 104.2/ from ILF at [facility]                Action/Plan:Date:  October 08, 2016 Chart reviewed for concurrent status and case management needs. Will continue to follow patient progress. Discharge Planning: following for needs Expected discharge date: [id number] Velva Harman, BSN, [place], [place]   Expected Discharge Date:   (unknown)               Expected Discharge Plan:  Home/Self Care  In-House Referral:  Clinical Social Work  Discharge planning Services  CM Consult  Post Acute Care Choice:    Choice offered to:     DME Arranged:    DME Agency:     HH Arranged:    [place] Agency:     Status of Service:  In process, will continue to follow  If discussed at Long Length of Stay Meetings, dates discussed:    Additional Comments:  Leeroy Cha, RN 10/08/2016, 10:00 AM

## 2016-10-08 NOTE — Progress Notes (Signed)
CSW met with pt / spouse at bedside to assist with d/c planning. Pt is from [facility]. PT has recommended HHPT at d/c. Pt / spouse are aware of recommendations and in agreement with plan to return home with [facility]. RNCM will assist with d/c planning needs. [facility] has been updated.  Werner Lean   LCSW [phone]

## 2016-10-09 ENCOUNTER — Inpatient Hospital Stay (HOSPITAL_COMMUNITY): Payer: Medicare Other

## 2016-10-09 DIAGNOSIS — N179 Acute kidney failure, unspecified: Secondary | ICD-10-CM

## 2016-10-09 LAB — BASIC METABOLIC PANEL
ANION GAP: 6 (ref 5–15)
BUN: 24 mg/dL — ABNORMAL HIGH (ref 6–20)
CO2: 27 mmol/L (ref 22–32)
Calcium: 8.6 mg/dL — ABNORMAL LOW (ref 8.9–10.3)
Chloride: 108 mmol/L (ref 101–111)
Creatinine, Ser: 1.6 mg/dL — ABNORMAL HIGH (ref 0.61–1.24)
GFR calc Af Amer: 45 mL/min — ABNORMAL LOW (ref >60)
GFR calc non Af Amer: 38 mL/min — ABNORMAL LOW (ref >60)
Glucose, Bld: 126 mg/dL — ABNORMAL HIGH (ref 65–99)
POTASSIUM: 4.1 mmol/L (ref 3.5–5.1)
Sodium: 141 mmol/L (ref 135–145)

## 2016-10-09 LAB — CBC
HEMATOCRIT: 42.4 % (ref 39.0–52.0)
Hemoglobin: 14.1 g/dL (ref 13.0–17.0)
MCH: 31.5 pg (ref 26.0–34.0)
MCHC: 33.3 g/dL (ref 30.0–36.0)
MCV: 94.6 fL (ref 78.0–100.0)
Platelets: 52 10*3/uL — ABNORMAL LOW (ref 150–400)
RBC: 4.48 MIL/uL (ref 4.22–5.81)
RDW: 14.9 % (ref 11.5–15.5)
WBC: 3.6 10*3/uL — AB (ref 4.0–10.5)

## 2016-10-09 LAB — PROTIME-INR
INR: 1.48
Prothrombin Time: 18 seconds — ABNORMAL HIGH (ref 11.4–15.2)

## 2016-10-09 MED ORDER — WARFARIN SODIUM 5 MG PO TABS
10.0000 mg | ORAL_TABLET | Freq: Once | ORAL | Status: AC
  Administered 2016-10-09: 10 mg via ORAL
  Filled 2016-10-09: qty 2

## 2016-10-09 MED ORDER — SODIUM CHLORIDE 0.9 % IV BOLUS (SEPSIS)
250.0000 mL | Freq: Once | INTRAVENOUS | Status: AC
  Administered 2016-10-09: 250 mL via INTRAVENOUS

## 2016-10-09 MED ORDER — DEXTROSE 5 % IV SOLN
1.0000 g | INTRAVENOUS | Status: DC
  Administered 2016-10-09: 1 g via INTRAVENOUS
  Filled 2016-10-09 (×2): qty 10

## 2016-10-09 NOTE — Progress Notes (Signed)
[facility] PROGRESS NOTE  DJON TITH LNL:[id number] DOB: [birth year] DOA: 10/07/2016  PCP: Precious Reel, MD  Brief History/Interval Summary: 83 year old Caucasian male who lives independently living facility ambulating with the help of a walker with past medical history of recurrent UTIs, or the urinary bladder issues requiring self-catheterization, atrial fibrillation on Coumadin, dementia, thrombocytopenia, under evaluation by hematology, presented with fever, weakness, confusion. He recently completed a treatment of oral doxycycline for UTI. He was seen by his urologist for hematuria recently, which has improved. Patient was found to have abnormal UA. He was found to have sepsis with elevated lactic acid level. He was placed on broad-spectrum antibiotics and admitted to the stepdown unit.  Reason for Visit: Urinary tract infection with sepsis  Consultants: None  Procedures: None  Antibiotics: Vancomycin and Zosyn  Subjective/Interval History: Patient feels well this morning. Denies any abdominal pain, nausea or vomiting. No chest pain or shortness of breath.   ROS: No headaches  Objective:  Vital Signs  Vitals:   10/09/16 0322 10/09/16 0400 10/09/16 0500 10/09/16 0600  BP:  (!) 141/61  (!) 161/81  Pulse:  84 74 78  Resp:  (!) 21 (!) 23 20  Temp: 98.3 F (36.8 C)     TempSrc: Axillary     SpO2:  98% 100% 98%  Weight:      Height:        Intake/Output Summary (Last 24 hours) at 10/09/16 0740 Last data filed at 10/08/16 2222  Gross per 24 hour  Intake           428.33 ml  Output             1700 ml  Net         -1271.67 ml   Filed Weights   10/07/16 1345 10/07/16 2135 10/08/16 0500  Weight: 113.4 kg (250 lb) 123.8 kg (272 lb 14.9 oz) 125.2 kg (276 lb 0.3 oz)    General appearance: alert, cooperative, appears stated age and no distress Resp: clear to auscultation bilaterally Cardio: regular rate and rhythm, S1, S2 normal, no murmur, click, rub or  gallop GI: soft, non-tender; bowel sounds normal; no masses,  no organomegaly Neurologic: Awake and alert. Oriented 3. Cranial nerve II-12 intact. No obvious focal neurological deficits are noted.  Lab Results:  Data Reviewed: I have personally reviewed following labs and imaging studies  CBC:  Recent Labs Lab 10/07/16 1405 10/08/16 0311 10/09/16 0338  WBC 6.3 5.2 3.6*  NEUTROABS 6.0  --   --   HGB 16.4 14.1 14.1  HCT 46.2 41.7 42.4  MCV 89.7 93.7 94.6  PLT 64* 62* 52*    Basic Metabolic Panel:  Recent Labs Lab 10/07/16 1405 10/08/16 0311 10/09/16 0338  NA 138 139 141  K 3.8 3.8 4.1  CL 105 107 108  CO2 23 25 27   GLUCOSE 161* 121* 126*  BUN 16 21* 24*  CREATININE 1.11 1.27* 1.60*  CALCIUM 9.8 8.6* 8.6*    GFR: Estimated Creatinine Clearance: 53.5 mL/min (A) (by C-G formula based on SCr of 1.6 mg/dL (H)).  Liver Function Tests:  Recent Labs Lab 10/07/16 1405  AST 35  ALT 22  ALKPHOS 57  BILITOT 2.8*  PROT 6.9  ALBUMIN 4.3     Recent Labs Lab 10/07/16 1405  LIPASE 20    Coagulation Profile:  Recent Labs Lab 10/07/16 1405 10/08/16 0311 10/09/16 0338  INR 1.82 1.77 1.48    Recent Results (from the past 240 hour(s))  Culture, blood (Routine x 2)     Status: None (Preliminary result)   Collection Time: 10/07/16  2:05 PM  Result Value Ref Range Status   Specimen Description BLOOD LEFT ANTECUBITAL  Final   Special Requests Blood Culture adequate volume  Final   Culture   Final    NO GROWTH < 24 HOURS Performed at [facility], [place] [address] [place], [place] [address]    Report Status PENDING  Incomplete  Culture, blood (Routine x 2)     Status: None (Preliminary result)   Collection Time: 10/07/16  2:05 PM  Result Value Ref Range Status   Specimen Description BLOOD RIGHT HAND  Final   Special Requests BOTTLES DRAWN AEROBIC AND ANAEROBIC BCAV  Final   Culture   Final    NO GROWTH < 24 HOURS Performed at [facility], [place] [address], [place], [place] [address]    Report Status PENDING  Incomplete  MRSA PCR Screening     Status: None   Collection Time: 10/07/16  9:37 PM  Result Value Ref Range Status   MRSA by PCR NEGATIVE NEGATIVE Final    Comment:        The GeneXpert MRSA Assay (FDA approved for NASAL specimens only), is one component of a comprehensive MRSA colonization surveillance program. It is not intended to diagnose MRSA infection nor to guide or monitor treatment for MRSA infections.       Radiology Studies: Dg Chest 2 View  Result Date: 10/07/2016 CLINICAL DATA:  Fever, weakness EXAM: CHEST  2 VIEW COMPARISON:  03/11/2016 FINDINGS: Cardiomegaly again noted. Limited study by poor inspiration. No segmental infiltrate or pulmonary edema. Mild basilar atelectasis. Degenerative changes mid and lower thoracic spine IMPRESSION: Limited study by poor inspiration. Mild basilar atelectasis. Cardiomegaly. No segmental infiltrate or pulmonary edema. Electronically Signed   By: Lahoma Crocker M.D.   On: 10/07/2016 14:46     Medications:  Scheduled: . atorvastatin  20 mg Oral q1800  . cefTRIAXone (ROCEPHIN)  IV  1 g Intravenous Q24H  . donepezil  10 mg Oral Daily  . folic acid  1 mg Oral Daily  . multivitamin with minerals  1 tablet Oral Daily  . sodium chloride  250 mL Intravenous Once  . sodium chloride flush  3 mL Intravenous Q12H  . tamsulosin  0.8 mg Oral QPC supper  . thiamine  100 mg Oral Daily  . warfarin  10 mg Oral Once  . Warfarin - Pharmacist Dosing Inpatient   Does not apply q1800   Continuous: . sodium chloride 75 mL/hr at 10/08/16 1200   DPO:[id number] **OR** acetaminophen, albuterol, bisacodyl, LORazepam, ondansetron **OR** ondansetron (ZOFRAN) IV, senna-docusate  Assessment/Plan:  Principal Problem:   Sepsis secondary to UTI [facility]) Active Problems:   Atrial fibrillation (HCC)   Essential hypertension   Hyperlipidemia   Chronic anticoagulation   Hereditary and  idiopathic peripheral neuropathy   Thrombocytopenia (HCC)    Sepsis secondary to UTI/complicated UTI Most likely due to incomplete bladder emptying and self-catheterization at home. Gives history of recurrent UTI. Recently treated by his urologist with a 1 week course of doxycycline. Empirically started on IV vancomycin and Zosyn. Blood cultures are negative so far. Urine cultures growing gram-negative rods. Discussed with urology office. Urine culture was sent on March 23. Positive for significant Klebsiella sensitive to amoxicillin, ceftriaxone, Cipro, cefepime. Resistant to tetracycline and Bactrim. Change to intravenous ceftriaxone. Patient did have elevated lactic acid level which has improved.  Atrial fibrillation Patient's heart rate is noted to climb whenever he has discomfort or when he moves. Not noted to be on any rate limiting drugs. Previous cardiology notes also reviewed. He is anticoagulated with warfarin. Continue per pharmacy. Continue to monitor on telemetry for now. Heart rate is well controlled currently.   Acute kidney injury Patient is making urine. Creatinine continues to rise. Could be due to antibiotics. Antibiotics to be changed today. Give IV fluid bolus. Recheck tomorrow. Proceed with renal ultrasound due to issues with urinary retention to rule out any obstructive uropathy.   History of Essential hypertension Reasonably well controlled. Holding HCTZ for now. Monitor closely   History of Hyperlipidemia Continue statin  Chronic anticoagulation As above.   Hereditary and idiopathic peripheral neuropathy/dementia Continue home medications. Follows with outpatient neurology. Confusion was most likely related to sepsis/UTI and appears now to be close to baseline.   Thrombocytopenia As per outpatient hematologist, his thrombocytopenia may be related to alcohol consumption, ITP or early myelodysplastic syndrome but continued observation at this time. Platelet  counts are stable.  Alcohol use/? Abuse As per spouse's report, patient drinks 2 bourbons drinks every night. Placed on CIWA protocol. No signs or symptoms of withdrawal. Continue thiamine.   DVT Prophylaxis: On warfarin    Code Status: DO NOT RESUSCITATE  Family Communication: No family at bedside  Disposition Plan: Patient seems to stabilize. Okay for transfer to floor. PT evaluation.   LOS: 2 days   [facility] Pager [phone] 10/09/2016, 7:40 AM  If 7PM-7AM, please contact night-coverage at [email or web address], password [facility]

## 2016-10-09 NOTE — Progress Notes (Signed)
qPhysical Therapy Treatment Patient Details Name: Peter Singh MRN: [id number] DOB: [birth year] Today's Date: 10/09/2016    History of Present Illness 83 year old Caucasian male who lives independently living facility ambulating with the help of a walker with past medical history of recurrent UTIs, or the urinary bladder issues requiring self-catheterization, atrial fibrillation on Coumadin, dementia, thrombocytopenia admitted 10/07/16 for sepsis due to UTI and afib    PT Comments    Pt requiring mod assist for lean upon standing and occasional min assist during ambulation.  May need SNF if pt does not progress, or assist upon return to facility.   Follow Up Recommendations  Home health PT;Supervision/Assistance - 24 hour     Equipment Recommendations  None recommended by PT    Recommendations for Other Services       Precautions / Restrictions Precautions Precautions: Fall Restrictions Weight Bearing Restrictions: No    Mobility  Bed Mobility Overal bed mobility: Needs Assistance Bed Mobility: Supine to Sit     Supine to sit: Min guard     General bed mobility comments: increased time and effort, utilized rails  Transfers Overall transfer level: Needs assistance Equipment used: Rolling walker (2 wheeled) Transfers: Sit to/from Stand Sit to Stand: Mod assist         General transfer comment: verbal cues for hand placement, posterior lean upon standing requiring mod assist to correct  Ambulation/Gait Ambulation/Gait assistance: Min assist Ambulation Distance (Feet): 400 Feet Assistive device: Rolling walker (2 wheeled) Gait Pattern/deviations: Step-through pattern;Wide base of support;Decreased stride length     General Gait Details: verbal cues for posture and RW positioning, occasional trunk sway requiring min assist, especially with turning   Stairs            Wheelchair Mobility    Modified Rankin (Stroke Patients Only)       Balance                                             Cognition Arousal/Alertness: Awake/alert Behavior During Therapy: WFL for tasks assessed/performed Overall Cognitive Status: Within Functional Limits for tasks assessed                                        Exercises      General Comments        Pertinent Vitals/Pain Pain Assessment: No/denies pain    Home Living                      Prior Function            PT Goals (current goals can now be found in the care plan section) Progress towards PT goals: Progressing toward goals    Frequency    Min 3X/week      PT Plan Current plan remains appropriate    Co-evaluation             End of Session Equipment Utilized During Treatment: Gait belt Activity Tolerance: Patient tolerated treatment well Patient left: with call bell/phone within reach;with family/visitor present;in chair;with chair alarm set Nurse Communication: Mobility status PT Visit Diagnosis: Unsteadiness on feet (R26.81);Muscle weakness (generalized) (M62.81)     Time: 2703-5009 PT Time Calculation (min) (ACUTE ONLY): 21 min  Charges:  $Gait Training: 8-22 mins  G CodesCarmelia Bake, PT, DPT 10/09/2016 Pager: [phone]    York Ram E 10/09/2016, 2:00 PM

## 2016-10-09 NOTE — Progress Notes (Signed)
ANTICOAGULATION CONSULT NOTE - follow up  Pharmacy Consult for Warfarin Indication: atrial fibrillation  Allergies  Allergen Reactions  . Penicillins Rash    Has patient had a PCN reaction causing immediate rash, facial/tongue/throat swelling, SOB or lightheadedness with hypotension: unknown Has patient had a PCN reaction causing severe rash involving mucus membranes or skin necrosis: unknown Has patient had a PCN reaction that required hospitalization : unknown Has patient had a PCN reaction occurring within the last 10 years: no If all of the above answers are "NO", then may proceed with Cephalosporin use.     Patient Measurements: Height: 6\' 7"  (200.7 cm) Weight: 276 lb 0.3 oz (125.2 kg) IBW/kg (Calculated) : 93.7   Vital Signs: Temp: 98.3 F (36.8 C) (04/05 0322) Temp Source: Axillary (04/05 0322) BP: 161/81 (04/05 0600) Pulse Rate: 78 (04/05 0600)  Labs:  Recent Labs  10/07/16 1405 10/08/16 0311 10/09/16 0338  HGB 16.4 14.1 14.1  HCT 46.2 41.7 42.4  PLT 64* 62* 52*  LABPROT 21.3* 20.8* 18.0*  INR 1.82 1.77 1.48  CREATININE 1.11 1.27* 1.60*    Estimated Creatinine Clearance: 53.5 mL/min (A) (by C-G formula based on SCr of 1.6 mg/dL (H)).   Medical History: Past Medical History:  Diagnosis Date  . Atrial fibrillation ([place])   . Diverticulosis of colon (without mention of hemorrhage)   . Hyperlipemia   . Hypertension   . Melanoma ([place])   . Personal history of colonic polyps 1999 & 2004   adenomatous polyps  . Ventricular hypertrophy     Assessment:  61 male known to pharmacy from Vancomycin and Zosyn dosing  PTA patient on warfarin for PMH including AFib  INR upon admission = 1.82  Patient's home regimen of warfarin: 7.5mg  daily except 3.75mg  on Sunday, Tuesday & Fridays  Patient reports taking meds daily around 18:00; however pt notes forgot to take Warfarin on 4/2 and took that day's dose (7.5mg ) on 4/3 at 0700  Today, 10/09/16  INR  subtherapeutic this AM and decreased overnight, likely from missing dose 4/2  No reported bleeding  H/H stable, chronic thrombocytopenia - plts decreased overnight  Goal of Therapy:  INR 2-3   Plan:  1) Increase warfarin 10mg  today and give early at 10:00 2) Daily INR   Peggyann Juba, PharmD, BCPS Pager: [phone] 10/09/2016 7:10 AM

## 2016-10-09 NOTE — Care Management Note (Signed)
Case Management Note  Patient Details  Name: Peter Singh MRN: [id number] Date of Birth: 1932-02-27  Subjective/Objective: Transfer from SDU. PT recc HHPT. From [facility]-indep liv-facility has own HHTP-[facility]-Please put in HHPT, [id number] orders @ d/c, & on fl2.                   Action/Plan:d/c back to [facility] w/HHC.   Expected Discharge Date:   (unknown)               Expected Discharge Plan:  [place]  In-House Referral:  Clinical Social Work  Discharge planning Services  CM Consult  Post Acute Care Choice:    Choice offered to:  Patient  DME Arranged:    DME Agency:     HH Arranged:    [place] Agency:     Status of Service:  In process, will continue to follow  If discussed at Long Length of Stay Meetings, dates discussed:    Additional Comments:  Dessa Phi, RN 10/09/2016, 12:15 PM

## 2016-10-10 DIAGNOSIS — N3000 Acute cystitis without hematuria: Secondary | ICD-10-CM

## 2016-10-10 LAB — BASIC METABOLIC PANEL
ANION GAP: 5 (ref 5–15)
BUN: 25 mg/dL — ABNORMAL HIGH (ref 6–20)
CHLORIDE: 113 mmol/L — AB (ref 101–111)
CO2: 25 mmol/L (ref 22–32)
CREATININE: 1.56 mg/dL — AB (ref 0.61–1.24)
Calcium: 9 mg/dL (ref 8.9–10.3)
GFR calc non Af Amer: 40 mL/min — ABNORMAL LOW (ref >60)
GFR, EST AFRICAN AMERICAN: 46 mL/min — AB (ref >60)
Glucose, Bld: 110 mg/dL — ABNORMAL HIGH (ref 65–99)
POTASSIUM: 3.8 mmol/L (ref 3.5–5.1)
SODIUM: 143 mmol/L (ref 135–145)

## 2016-10-10 LAB — URINE CULTURE: Culture: >=100000 — AB

## 2016-10-10 LAB — PROTIME-INR
INR: 1.93
PROTHROMBIN TIME: 22.3 s — AB (ref 11.4–15.2)

## 2016-10-10 LAB — CBC
HEMATOCRIT: 41.3 % (ref 39.0–52.0)
HEMOGLOBIN: 13.9 g/dL (ref 13.0–17.0)
MCH: 31 pg (ref 26.0–34.0)
MCHC: 33.7 g/dL (ref 30.0–36.0)
MCV: 92 fL (ref 78.0–100.0)
Platelets: 60 10*3/uL — ABNORMAL LOW (ref 150–400)
RBC: 4.49 MIL/uL (ref 4.22–5.81)
RDW: 14.8 % (ref 11.5–15.5)
WBC: 3 10*3/uL — ABNORMAL LOW (ref 4.0–10.5)

## 2016-10-10 MED ORDER — CEFPODOXIME PROXETIL 200 MG PO TABS
100.0000 mg | ORAL_TABLET | Freq: Two times a day (BID) | ORAL | Status: DC
  Administered 2016-10-10: 100 mg via ORAL
  Filled 2016-10-10: qty 1

## 2016-10-10 MED ORDER — WARFARIN SODIUM 4 MG PO TABS
4.0000 mg | ORAL_TABLET | Freq: Once | ORAL | Status: DC
  Filled 2016-10-10: qty 1

## 2016-10-10 MED ORDER — CEFPODOXIME PROXETIL 100 MG PO TABS: 100.0000 mg | ORAL_TABLET | Freq: Two times a day (BID) | ORAL | 0 refills | Status: AC

## 2016-10-10 NOTE — Progress Notes (Signed)
CSW confirm [facility] is agreeable to allow patient to go to rehab before going back to independent apartment.  csw updated patient wife at bedside.  Patient will transport by car.    Kathrin Greathouse, Latanya Presser, MSW Clinical Social Worker 5E and [place] [phone] 10/10/2016  12:15 PM

## 2016-10-10 NOTE — Clinical Social Work Placement (Signed)
   CLINICAL SOCIAL WORK PLACEMENT  NOTE  Date:  10/10/2016  Patient Details  Name: Peter Singh MRN: [id number] Date of Birth: 1931-07-09  Clinical Social Work is seeking post-discharge placement for this patient at the [place] level of care (*CSW will initial, date and re-position this form in  chart as items are completed):      Patient/family provided with [place] Work Department's list of facilities offering this level of care within the geographic area requested by the patient (or if unable, by the patient's family).      Patient/family informed of their freedom to choose among providers that offer the needed level of care, that participate in Medicare, Medicaid or managed care program needed by the patient, have an available bed and are willing to accept the patient.      Patient/family informed of [place]'s ownership interest in [facility] and [facility], as well as of the fact that they are under no obligation to receive care at these facilities.  PASRR submitted to EDS on       PASRR number received on       Existing PASRR number confirmed on       FL2 transmitted to all facilities in geographic area requested by pt/family on       FL2 transmitted to all facilities within larger geographic area on       Patient informed that his/her managed care company has contracts with or will negotiate with certain facilities, including the following:  [facility]         Patient/family informed of bed offers received.  Patient chooses bed at [facility]     Physician recommends and patient chooses bed at [facility]    Patient to be transferred to [facility] on 10/10/16.  Patient to be transferred to facility by Spouse will transport     Patient family notified on 10/10/16 of transfer.  Name of family member notified:  Spouse at bedside.      PHYSICIAN Please sign DNR, Please prepare priority discharge summary, including  medications, Please prepare prescriptions     Additional Comment:    _______________________________________________ Lia Hopping, LCSW 10/10/2016, 12:17 PM

## 2016-10-10 NOTE — Care Management Note (Signed)
Case Management Note  Patient Details  Name: Peter Singh MRN: [id number] Date of Birth: February 02, 1932  Subjective/Objective: Peter Singh has own HHPT on site. No CM needs.                    Action/Plan:d/c back to [facility].   Expected Discharge Date:  10/10/16               Expected Discharge Plan:  [place]  In-House Referral:  Clinical Social Work  Discharge planning Services  CM Consult  Post Acute Care Choice:    Choice offered to:  Patient  DME Arranged:    DME Agency:     HH Arranged:  PT HH Agency:     Status of Service:  Completed, signed off  If discussed at [employer] of Stay Meetings, dates discussed:    Additional Comments:  Dessa Phi, RN 10/10/2016, 1:41 PM

## 2016-10-10 NOTE — Progress Notes (Signed)
Report called to [facility]. Stacey Drain

## 2016-10-10 NOTE — NC FL2 (Signed)
[place] LEVEL OF CARE SCREENING TOOL     IDENTIFICATION  Patient Name: Peter Singh Birthdate: 02-07-1932 Sex: male Admission Date (Current Location): 10/07/2016  [facility] and [place] Number:  Herbalist and Address:  [facility],  [place] [address] [place]      Provider Number: [id number]  Attending Physician Name and Address:  Bonnielee Haff, MD  Relative Name and Phone Number:       Current Level of Care: Hospital Recommended Level of Care: [place] Prior Approval Number:    Date Approved/Denied:   PASRR Number:    Discharge Plan: SNF    Current Diagnoses: Patient Active Problem List   Diagnosis Date Noted  . Sepsis secondary to UTI ([place]) 10/07/2016  . Thrombocytopenia ([place]) 10/07/2016  . Hereditary and idiopathic peripheral neuropathy 07/06/2014  . Essential hypertension 03/24/2014  . Hyperlipidemia 03/24/2014  . Chronic anticoagulation 03/24/2014  . Atrial fibrillation ([place]) 05/17/2013    Orientation RESPIRATION BLADDER Height & Weight     Self, Time, Situation, Place  Normal Continent Weight: 283 lb 1.1 oz (128.4 kg) Height:  6\' 7"  (200.7 cm)  BEHAVIORAL SYMPTOMS/MOOD NEUROLOGICAL BOWEL NUTRITION STATUS      Continent Diet (Heart Healthy )  AMBULATORY STATUS COMMUNICATION OF NEEDS Skin   Limited Assist Verbally Normal                       Personal Care Assistance Level of Assistance  Bathing, Feeding, Dressing Bathing Assistance: Limited assistance Feeding assistance: Independent Dressing Assistance: Limited assistance     Functional Limitations Info  Sight, Hearing, Speech Sight Info: Adequate Hearing Info: Adequate Speech Info: Adequate    SPECIAL CARE FACTORS FREQUENCY  PT (By licensed PT)     PT Frequency: 5              Contractures Contractures Info: Not present    Additional Factors Info  Code Status, Allergies Code Status Info: DNR Allergies Info:  Penicillins           Current Medications (10/10/2016):  This is the current hospital active medication list Current Facility-Administered Medications  Medication Dose Route Frequency Provider Last Rate Last Dose  . acetaminophen (TYLENOL) tablet 650 mg  650 mg Oral Q6H PRN Modena Jansky, MD       Or  . acetaminophen (TYLENOL) suppository 650 mg  650 mg Rectal Q6H PRN Modena Jansky, MD      . albuterol (PROVENTIL) (2.5 MG/3ML) 0.083% nebulizer solution 2.5 mg  2.5 mg Nebulization Q2H PRN Modena Jansky, MD      . atorvastatin (LIPITOR) tablet 20 mg  20 mg Oral q1800 Modena Jansky, MD   20 mg at 10/09/16 1721  . bisacodyl (DULCOLAX) suppository 10 mg  10 mg Rectal Daily PRN Modena Jansky, MD      . cefpodoxime Bennie Pierini) tablet 100 mg  100 mg Oral Q12H Bonnielee Haff, MD      . donepezil (ARICEPT) tablet 10 mg  10 mg Oral Daily Modena Jansky, MD   10 mg at 10/10/16 0933  . folic acid (FOLVITE) tablet 1 mg  1 mg Oral Daily Modena Jansky, MD   1 mg at 10/10/16 0933  . LORazepam (ATIVAN) injection 2-3 mg  2-3 mg Intravenous Q1H PRN Modena Jansky, MD   2 mg at 10/08/16 1602  . multivitamin with minerals tablet 1 tablet  1 tablet Oral Daily  Modena Jansky, MD   1 tablet at 10/10/16 [phone]  . ondansetron (ZOFRAN) tablet 4 mg  4 mg Oral Q6H PRN Modena Jansky, MD       Or  . ondansetron (ZOFRAN) injection 4 mg  4 mg Intravenous Q6H PRN Modena Jansky, MD      . senna-docusate (Senokot-S) tablet 1 tablet  1 tablet Oral QHS PRN Modena Jansky, MD      . sodium chloride flush (NS) 0.9 % injection 3 mL  3 mL Intravenous Q12H Modena Jansky, MD   3 mL at 10/09/16 2210  . tamsulosin (FLOMAX) capsule 0.8 mg  0.8 mg Oral QPC supper Modena Jansky, MD   0.8 mg at 10/09/16 1721  . thiamine (VITAMIN B-1) tablet 100 mg  100 mg Oral Daily Modena Jansky, MD   100 mg at 10/10/16 0933  . Warfarin - Pharmacist Dosing Inpatient   Does not apply [place], [facility]          Discharge Medications: Please see discharge summary for a list of discharge medications.  Relevant Imaging Results:  Relevant Lab Results:   Additional Information SS # [id number]  Lia Hopping, LCSW

## 2016-10-10 NOTE — Progress Notes (Signed)
ANTICOAGULATION CONSULT NOTE - follow up  Pharmacy Consult for Warfarin Indication: atrial fibrillation  Allergies  Allergen Reactions  . Penicillins Rash    Has patient had a PCN reaction causing immediate rash, facial/tongue/throat swelling, SOB or lightheadedness with hypotension: unknown Has patient had a PCN reaction causing severe rash involving mucus membranes or skin necrosis: unknown Has patient had a PCN reaction that required hospitalization : unknown Has patient had a PCN reaction occurring within the last 10 years: no If all of the above answers are "NO", then may proceed with Cephalosporin use.     Patient Measurements: Height: 6\' 7"  (200.7 cm) Weight: 283 lb 1.1 oz (128.4 kg) IBW/kg (Calculated) : 93.7   Vital Signs: Temp: 98.6 F (37 C) (04/06 0441) Temp Source: Oral (04/06 0441) BP: 129/81 (04/06 0441) Pulse Rate: 68 (04/06 0441)  Labs:  Recent Labs  10/08/16 0311 10/09/16 0338 10/10/16 0620  HGB 14.1 14.1 13.9  HCT 41.7 42.4 41.3  PLT 62* 52* 60*  LABPROT 20.8* 18.0* 22.3*  INR 1.77 1.48 1.93  CREATININE 1.27* 1.60* 1.56*    Estimated Creatinine Clearance: 55.6 mL/min (A) (by C-G formula based on SCr of 1.56 mg/dL (H)).   Medical History: Past Medical History:  Diagnosis Date  . Atrial fibrillation ([place])   . Diverticulosis of colon (without mention of hemorrhage)   . Hyperlipemia   . Hypertension   . Melanoma ([place])   . Personal history of colonic polyps 1999 & 2004   adenomatous polyps  . Ventricular hypertrophy     Assessment:  PTA patient on warfarin for PMH including AFib  INR upon admission = 1.82  Patient's home regimen of warfarin: 7.5mg  daily except 3.75mg  on Sunday, Tuesday & Fridays  Patient reports taking meds daily around 18:00; however pt notes forgot to take Warfarin on 4/2 and took that day's dose (7.5mg ) on 4/3 at 0700  Today, 10/10/16  INR subtherapeutic, but increase in INR overnight after 10mg  bolus dose  No  reported bleeding  H/H stable, chronic thrombocytopenia - plts decreased overnight  Goal of Therapy:  INR 2-3   Plan:  1) Warfarin 4mg  today x1 2) Daily INR  Netta Cedars, PharmD, BCPS Pager: [phone] 10/10/2016 12:51 PM

## 2016-10-10 NOTE — Discharge Instructions (Signed)
Urinary Tract Infection, Adult °A urinary tract infection (UTI) is an infection of any part of the urinary tract. The urinary tract includes the: °· Kidneys. °· Ureters. °· Bladder. °· Urethra. °These organs make, store, and get rid of pee (urine) in the body. °Follow these instructions at home: °· Take over-the-counter and prescription medicines only as told by your doctor. °· If you were prescribed an antibiotic medicine, take it as told by your doctor. Do not stop taking the antibiotic even if you start to feel better. °· Avoid the following drinks: °¨ Alcohol. °¨ Caffeine. °¨ Tea. °¨ Carbonated drinks. °· Drink enough fluid to keep your pee clear or pale yellow. °· Keep all follow-up visits as told by your doctor. This is important. °· Make sure to: °¨ Empty your bladder often and completely. Do not to hold pee for long periods of time. °¨ Empty your bladder before and after sex. °¨ Wipe from front to back after a bowel movement if you are male. Use each tissue one time when you wipe. °Contact a doctor if: °· You have back pain. °· You have a fever. °· You feel sick to your stomach (nauseous). °· You throw up (vomit). °· Your symptoms do not get better after 3 days. °· Your symptoms go away and then come back. °Get help right away if: °· You have very bad back pain. °· You have very bad lower belly (abdominal) pain. °· You are throwing up and cannot keep down any medicines or water. °This information is not intended to replace advice given to you by your health care provider. Make sure you discuss any questions you have with your health care provider. °Document Released: 12/10/2007 Document Revised: 11/29/2015 Document Reviewed: 05/14/2015 °Elsevier Interactive Patient Education © 2017 Elsevier Inc. ° °

## 2016-10-10 NOTE — Discharge Summary (Signed)
[facility]  Physician Discharge Summary   Patient ID: Peter Singh MRN: [id number] DOB/AGE: 81/31/1933 83 y.o.  Admit date: 10/07/2016 Discharge date: 10/10/2016  PCP: Precious Reel, MD  DISCHARGE DIAGNOSES:  Principal Problem:   Sepsis secondary to UTI [facility]) Active Problems:   Atrial fibrillation ([place])   Essential hypertension   Hyperlipidemia   Chronic anticoagulation   Hereditary and idiopathic peripheral neuropathy   Thrombocytopenia ([place])   RECOMMENDATIONS FOR OUTPATIENT FOLLOW UP: Patient will need PT/INR on Monday.  Patient will need basic metabolic panel on Monday to check creatinine.  Patient to keep his appointment with urology office next week.  Sterile precautions, to be used whenever he does in and out catheterization. HCTZ has been held due to increase in creatinine. Monitor blood pressure. Consider resuming or using alternative agent depending on repeat basic metabolic panel   DISCHARGE CONDITION: fair  Diet recommendation: Heart healthy  Filed Weights   10/07/16 2135 10/08/16 0500 10/10/16 0441  Weight: 123.8 kg (272 lb 14.9 oz) 125.2 kg (276 lb 0.3 oz) 128.4 kg (283 lb 1.1 oz)    INITIAL HISTORY: 83 year old Caucasian male who lives independently living facility ambulating with the help of a walker with past medical history of recurrent UTIs, or the urinary bladder issues requiring self-catheterization, atrial fibrillation on Coumadin, dementia, thrombocytopenia, under evaluation by hematology, presented with fever, weakness, confusion. He recently completed a treatment of oral doxycycline for UTI. He was seen by his urologist for hematuria recently, which has improved. Patient was found to have abnormal UA. He was found to have sepsis with elevated lactic acid level. He was placed on broad-spectrum antibiotics and admitted to the stepdown unit.  HOSPITAL COURSE:   Sepsis secondary to UTI/complicated UTI Most likely due to incomplete bladder  emptying and self-catheterization at home. Gives history of recurrent UTI. Recently treated by his urologist with a 1 week course of doxycycline. Empirically started on IV vancomycin and Zosyn. Blood cultures are negative so far. Urine cultures growing Klebsiella. Patient did have elevated lactic acid level which has improved. Discussed with urology office. Urine culture was sent on March 23. That was also positive for significant Klebsiella sensitive to amoxicillin, ceftriaxone, Cipro, cefepime. Resistant to tetracycline and Bactrim. It is likely that the initial infection was not treated due to resistance. Patient was transitioned over to ceftriaxone. He is much improved. [facility]. [facility]'ll be transitioned over to [facility] and could be discharged later today. This was discussed with the patient and his wife.   Atrial fibrillation Patient's heart rate was noted to be elevated. Initial part of this hospitalization. Most likely due to infection and sepsis. Now improved and back to baseline. Not noted to be on any rate limiting drugs. Previous cardiology notes also reviewed. He is anticoagulated with warfarin. INR was subtherapeutic. INR is up to 1.9 today. Warfarin can be resumed. INR will need to be checked on Monday.   Acute kidney injury Patient is making urine. Creatinine was noted to increase. His rise in creatinine could've been due to vancomycin. This was discontinued. He was given more fluids. His oral intake improved. Creatinine is better today. A basic metabolic panel will need to be checked on Monday. This can be done at the skilled nursing facility. Renal ultrasound did not show any obstructive uropathy. Previously known right kidney lesion was noted.   History of Essential hypertension Reasonably well controlled. Due to rising creatinine, HCTZ was discontinued. We'll continue to hold it for now.  To be addressed further at the skilled nursing facility.   History of  Hyperlipidemia Continue statin  Chronic anticoagulation As discussed above.   Hereditary and idiopathic peripheral neuropathy/dementia Continue home medications. Follows with outpatient neurology. Confusion was most likely related to sepsis/UTI and appears now to be back to baseline.   Thrombocytopenia As per outpatient hematologist, his thrombocytopenia may be related to alcohol consumption, ITP or early myelodysplastic syndrome but continued observation at this time. Platelet counts are stable.  Alcohol use/? Abuse As per spouse's report, patient drinks 2 bourbons drinks every night. Placed on CIWA protocol. No signs or symptoms of withdrawal.   Overall, stable. Physical therapy did recommend home health. However, wife states that she is unable to care for him at this time. She would prefer that he had short-term rehabilitation. Social worker has arranged this at his living facility, which also has a rehabilitation section. Okay for discharge today.    PERTINENT LABS:  The results of significant diagnostics from this hospitalization (including imaging, microbiology, ancillary and laboratory) are listed below for reference.    Microbiology: Recent Results (from the past 240 hour(s))  Culture, Urine     Status: Abnormal   Collection Time: 10/07/16  1:47 PM  Result Value Ref Range Status   Specimen Description URINE, CATHETERIZED  Final   Special Requests NONE  Final   Culture >=100,000 COLONIES/mL KLEBSIELLA PNEUMONIAE (A)  Final   Report Status 10/10/2016 FINAL  Final   Organism ID, Bacteria KLEBSIELLA PNEUMONIAE (A)  Final      Susceptibility   Klebsiella pneumoniae - MIC*    AMPICILLIN >=32 RESISTANT Resistant     CEFAZOLIN <=4 SENSITIVE Sensitive     CEFTRIAXONE <=1 SENSITIVE Sensitive     CIPROFLOXACIN <=0.25 SENSITIVE Sensitive     GENTAMICIN <=1 SENSITIVE Sensitive     IMIPENEM <=0.25 SENSITIVE Sensitive     NITROFURANTOIN 128 RESISTANT Resistant     TRIMETH/SULFA  <=20 SENSITIVE Sensitive     AMPICILLIN/SULBACTAM 16 INTERMEDIATE Intermediate     PIP/TAZO 16 SENSITIVE Sensitive     Extended ESBL NEGATIVE Sensitive     * >=100,000 COLONIES/mL KLEBSIELLA PNEUMONIAE  Culture, blood (Routine x 2)     Status: None (Preliminary result)   Collection Time: 10/07/16  2:05 PM  Result Value Ref Range Status   Specimen Description BLOOD LEFT ANTECUBITAL  Final   Special Requests Blood Culture adequate volume  Final   Culture   Final    NO GROWTH 2 DAYS Performed at [facility], [address], [place], [place] [address]    Report Status PENDING  Incomplete  Culture, blood (Routine x 2)     Status: None (Preliminary result)   Collection Time: 10/07/16  2:05 PM  Result Value Ref Range Status   Specimen Description BLOOD RIGHT HAND  Final   Special Requests BOTTLES DRAWN AEROBIC AND ANAEROBIC BCAV  Final   Culture   Final    NO GROWTH 2 DAYS Performed at [facility], [place] [address] [place], [place] [address]    Report Status PENDING  Incomplete  MRSA PCR Screening     Status: None   Collection Time: 10/07/16  9:37 PM  Result Value Ref Range Status   MRSA by PCR NEGATIVE NEGATIVE Final    Comment:        The GeneXpert MRSA Assay (FDA approved for NASAL specimens only), is one component of a comprehensive MRSA colonization surveillance program. It is not intended  to diagnose MRSA infection nor to guide or monitor treatment for MRSA infections.      Labs: Basic Metabolic Panel:  Recent Labs Lab 10/07/16 1405 10/08/16 0311 10/09/16 0338 10/10/16 0620  NA 138 139 141 143  K 3.8 3.8 4.1 3.8  CL 105 107 108 113*  CO2 23 25 27 25   GLUCOSE 161* 121* 126* 110*  BUN 16 21* 24* 25*  CREATININE 1.11 1.27* 1.60* 1.56*  CALCIUM 9.8 8.6* 8.6* 9.0   Liver Function Tests:  Recent Labs Lab 10/07/16 1405  AST 35  ALT 22  ALKPHOS 57  BILITOT 2.8*  PROT 6.9  ALBUMIN 4.3    Recent Labs Lab 10/07/16 1405  LIPASE 20    CBC:  Recent Labs Lab 10/07/16 1405 10/08/16 0311 10/09/16 0338 10/10/16 0620  WBC 6.3 5.2 3.6* 3.0*  NEUTROABS 6.0  --   --   --   HGB 16.4 14.1 14.1 13.9  HCT 46.2 41.7 42.4 41.3  MCV 89.7 93.7 94.6 92.0  PLT 64* 62* 52* 60*    IMAGING STUDIES Dg Chest 2 View  Result Date: 10/07/2016 CLINICAL DATA:  Fever, weakness EXAM: CHEST  2 VIEW COMPARISON:  03/11/2016 FINDINGS: Cardiomegaly again noted. Limited study by poor inspiration. No segmental infiltrate or pulmonary edema. Mild basilar atelectasis. Degenerative changes mid and lower thoracic spine IMPRESSION: Limited study by poor inspiration. Mild basilar atelectasis. Cardiomegaly. No segmental infiltrate or pulmonary edema. Electronically Signed   By: Lahoma Crocker M.D.   On: 10/07/2016 14:46   US Renal  Result Date: 10/09/2016 CLINICAL DATA:  Acute renal failure, history melanoma, atrial fibrillation, hypertension, former smoker EXAM: RENAL / URINARY TRACT ULTRASOUND COMPLETE COMPARISON:  07/18/2016 CT abdomen and pelvis FINDINGS: Right Kidney: Length: 13.6 cm. Normal cortical thickness. Upper normal cortical echogenicity. Isoechoic exophytic solid nodule at the upper pole 1.9 x 2.0 x 1.9 cm. This appear to be part of a larger nodule seen on arterial phase imaging on the prior CT exam up to 3.2 cm diameter, the remainder of which is not sonographically evident. No additional mass, hydronephrosis, or shadowing calcification. Left Kidney: Length: 12.7 cm. Normal cortical thickness. Borderline increased echogenicity. No mass, hydronephrosis or shadowing calcification. Bladder: Normal appearance. Incidentally noted prostatic enlargement gland 6.0 x 6.2 x 5.4 cm. IMPRESSION: Prostatic enlargement. Solid nodule at upper pole of RIGHT kidney 1.9 x 2.0 x 1.9 cm which by prior CT exam appears to be part of a larger nodule the remainder of which is sonographically in apparent, concerning for renal neoplasm. Electronically Signed   By: Lavonia Dana M.D.    On: 10/09/2016 16:03    DISCHARGE EXAMINATION: Vitals:   10/09/16 1100 10/09/16 1522 10/09/16 2010 10/10/16 0441  BP: 126/80 133/80 120/65 129/81  Pulse: 85 62 90 68  Resp: 18 16 18 18   Temp:  98.1 F (36.7 C) 97.8 F (36.6 C) 98.6 F (37 C)  TempSrc:  Oral Oral Oral  SpO2: 100% 99% 97% 97%  Weight:    128.4 kg (283 lb 1.1 oz)  Height:       General appearance: alert, cooperative, appears stated age and no distress Resp: clear to auscultation bilaterally Cardio: regular rate and rhythm, S1, S2 normal, no murmur, click, rub or gallop GI: soft, non-tender; bowel sounds normal; no masses,  no organomegaly Extremities: extremities normal, atraumatic, no cyanosis or edema  DISPOSITION: SNF  Discharge Instructions    Call MD for:  difficulty breathing, headache or visual disturbances  Complete by:  As directed    Call MD for:  extreme fatigue    Complete by:  As directed    Call MD for:  persistant dizziness or light-headedness    Complete by:  As directed    Call MD for:  persistant nausea and vomiting    Complete by:  As directed    Call MD for:  severe uncontrolled pain    Complete by:  As directed    Call MD for:  temperature >100.4    Complete by:  As directed    Discharge instructions    Complete by:  As directed    Patient will need PT/INR on Monday. Patient will need basic metabolic panel on Monday to check creatinine. Patient to keep his appointment with urology office next week. Sterile precautions, to be used whenever he does in and out catheterization.  You were cared for by a hospitalist during your hospital stay. If you have any questions about your discharge medications or the care you received while you were in the hospital after you are discharged, you can call the unit and asked to speak with the hospitalist on call if the hospitalist that took care of you is not available. Once you are discharged, your primary care physician will handle any further medical  issues. Please note that NO REFILLS for any discharge medications will be authorized once you are discharged, as it is imperative that you return to your primary care physician (or establish a relationship with a primary care physician if you do not have one) for your aftercare needs so that they can reassess your need for medications and monitor your lab values. If you do not have a primary care physician, you can call [phone] for a physician referral.   Increase activity slowly    Complete by:  As directed       ALLERGIES:  Allergies  Allergen Reactions  . Penicillins Rash    Has patient had a PCN reaction causing immediate rash, facial/tongue/throat swelling, SOB or lightheadedness with hypotension: unknown Has patient had a PCN reaction causing severe rash involving mucus membranes or skin necrosis: unknown Has patient had a PCN reaction that required hospitalization : unknown Has patient had a PCN reaction occurring within the last 10 years: no If all of the above answers are "NO", then may proceed with Cephalosporin use.      Current Discharge Medication List    START taking these medications   Details  cefpodoxime (VANTIN) 100 MG tablet Take 1 tablet (100 mg total) by mouth every 12 (twelve) hours. Qty: 14 tablet, Refills: 0      CONTINUE these medications which have NOT CHANGED   Details  acetaminophen (TYLENOL) 500 MG tablet Take 500 mg by mouth every morning. Document area of discomfort and effectiveness of medication.     atorvastatin (LIPITOR) 20 MG tablet Take 20 mg by mouth every evening.     donepezil (ARICEPT) 10 MG tablet Take 10 mg by mouth daily.     folic acid (FOLVITE) 1 MG tablet Take 1 tablet (1 mg total) by mouth daily.    Tamsulosin HCl (FLOMAX) 0.4 MG CAPS Take 0.8 mg by mouth daily after supper.     thiamine 100 MG tablet Take 1 tablet (100 mg total) by mouth daily.    warfarin (COUMADIN) 7.5 MG tablet TAKE 1 TABLET DAILY OR AS DIRECTED. Qty: 30  tablet, Refills: 3      STOP taking these medications  hydrochlorothiazide (MICROZIDE) 12.5 MG capsule           Contact information for follow-up providers    DAHLSTEDT, Lillette Boxer, MD Follow up.   Specialty:  Urology Why:  has appt next week per wife Contact information: [place] [address] [phone]            Contact information for after-discharge care    Destination    [facility] Follow up.   Specialty:  Skilled Nursing Engineer, manufacturing information: [place] [phone]                  TOTAL DISCHARGE TIME: 35 mins  [facility] Pager [phone]  10/10/2016, 12:46 PM

## 2016-10-11 DIAGNOSIS — Z7901 Long term (current) use of anticoagulants: Secondary | ICD-10-CM | POA: Diagnosis not present

## 2016-10-11 DIAGNOSIS — I4891 Unspecified atrial fibrillation: Secondary | ICD-10-CM | POA: Diagnosis not present

## 2016-10-11 LAB — PROTIME-INR: INR: 2 — AB (ref 0.9–1.1)

## 2016-10-12 LAB — CULTURE, BLOOD (ROUTINE X 2)
CULTURE: NO GROWTH
Culture: NO GROWTH
Special Requests: ADEQUATE

## 2016-10-13 ENCOUNTER — Ambulatory Visit (INDEPENDENT_AMBULATORY_CARE_PROVIDER_SITE_OTHER): Payer: Self-pay | Admitting: Internal Medicine

## 2016-10-13 ENCOUNTER — Telehealth: Payer: Self-pay | Admitting: Interventional Cardiology

## 2016-10-13 DIAGNOSIS — R3914 Feeling of incomplete bladder emptying: Secondary | ICD-10-CM | POA: Diagnosis not present

## 2016-10-13 DIAGNOSIS — I10 Essential (primary) hypertension: Secondary | ICD-10-CM | POA: Diagnosis not present

## 2016-10-13 DIAGNOSIS — N3001 Acute cystitis with hematuria: Secondary | ICD-10-CM | POA: Diagnosis not present

## 2016-10-13 DIAGNOSIS — I4819 Other persistent atrial fibrillation: Secondary | ICD-10-CM

## 2016-10-13 DIAGNOSIS — E785 Hyperlipidemia, unspecified: Secondary | ICD-10-CM | POA: Diagnosis not present

## 2016-10-13 DIAGNOSIS — I481 Persistent atrial fibrillation: Secondary | ICD-10-CM

## 2016-10-13 DIAGNOSIS — D649 Anemia, unspecified: Secondary | ICD-10-CM | POA: Diagnosis not present

## 2016-10-13 LAB — PROTIME-INR: Protime: 22.2 seconds — AB (ref 10.0–13.8)

## 2016-10-13 LAB — POCT INR: INR: 2 — AB (ref 0.9–1.1)

## 2016-10-13 LAB — BASIC METABOLIC PANEL
BUN: 17 mg/dL (ref 4–21)
Creatinine: 1.1 mg/dL (ref 0.6–1.3)
Glucose: 111 mg/dL
Potassium: 3.9 mmol/L (ref 3.4–5.3)
Sodium: 145 mmol/L (ref 137–147)

## 2016-10-13 NOTE — Telephone Encounter (Signed)
New Message     Are you going to follow this pt for his PRINR or do you want their physician ?

## 2016-10-13 NOTE — Telephone Encounter (Signed)
Spoke with Brook at [employer] she wanted to verify dose of pt warfarin. She reports he has been taking 7.5mg  daily since discharge on 10/10/16 - admission for sepsis. Gave verbal order for INR to be checked today so that we can dose. She will call once INR drawn. Direct line to coumadin clinic provided.

## 2016-10-14 ENCOUNTER — Non-Acute Institutional Stay (SKILLED_NURSING_FACILITY): Payer: Medicare Other | Admitting: Internal Medicine

## 2016-10-14 ENCOUNTER — Encounter: Payer: Self-pay | Admitting: Internal Medicine

## 2016-10-14 DIAGNOSIS — I481 Persistent atrial fibrillation: Secondary | ICD-10-CM

## 2016-10-14 DIAGNOSIS — N2889 Other specified disorders of kidney and ureter: Secondary | ICD-10-CM | POA: Diagnosis not present

## 2016-10-14 DIAGNOSIS — F05 Delirium due to known physiological condition: Secondary | ICD-10-CM | POA: Diagnosis not present

## 2016-10-14 DIAGNOSIS — Z7901 Long term (current) use of anticoagulants: Secondary | ICD-10-CM

## 2016-10-14 DIAGNOSIS — N390 Urinary tract infection, site not specified: Secondary | ICD-10-CM | POA: Diagnosis not present

## 2016-10-14 DIAGNOSIS — E784 Other hyperlipidemia: Secondary | ICD-10-CM | POA: Diagnosis not present

## 2016-10-14 DIAGNOSIS — E7849 Other hyperlipidemia: Secondary | ICD-10-CM

## 2016-10-14 DIAGNOSIS — F03A0 Unspecified dementia, mild, without behavioral disturbance, psychotic disturbance, mood disturbance, and anxiety: Secondary | ICD-10-CM | POA: Insufficient documentation

## 2016-10-14 DIAGNOSIS — F0390 Unspecified dementia without behavioral disturbance: Secondary | ICD-10-CM | POA: Insufficient documentation

## 2016-10-14 DIAGNOSIS — R2689 Other abnormalities of gait and mobility: Secondary | ICD-10-CM | POA: Diagnosis not present

## 2016-10-14 DIAGNOSIS — I4819 Other persistent atrial fibrillation: Secondary | ICD-10-CM

## 2016-10-14 DIAGNOSIS — D696 Thrombocytopenia, unspecified: Secondary | ICD-10-CM | POA: Diagnosis not present

## 2016-10-14 DIAGNOSIS — N179 Acute kidney failure, unspecified: Secondary | ICD-10-CM | POA: Insufficient documentation

## 2016-10-14 DIAGNOSIS — A4189 Other specified sepsis: Secondary | ICD-10-CM | POA: Diagnosis not present

## 2016-10-14 DIAGNOSIS — I10 Essential (primary) hypertension: Secondary | ICD-10-CM

## 2016-10-14 DIAGNOSIS — A419 Sepsis, unspecified organism: Secondary | ICD-10-CM | POA: Diagnosis not present

## 2016-10-14 DIAGNOSIS — G609 Hereditary and idiopathic neuropathy, unspecified: Secondary | ICD-10-CM | POA: Diagnosis not present

## 2016-10-14 DIAGNOSIS — N401 Enlarged prostate with lower urinary tract symptoms: Secondary | ICD-10-CM | POA: Insufficient documentation

## 2016-10-14 DIAGNOSIS — R2681 Unsteadiness on feet: Secondary | ICD-10-CM | POA: Diagnosis not present

## 2016-10-14 DIAGNOSIS — R278 Other lack of coordination: Secondary | ICD-10-CM | POA: Diagnosis not present

## 2016-10-14 DIAGNOSIS — R338 Other retention of urine: Secondary | ICD-10-CM

## 2016-10-14 DIAGNOSIS — Z9181 History of falling: Secondary | ICD-10-CM | POA: Diagnosis not present

## 2016-10-14 NOTE — Progress Notes (Signed)
Patient ID: Peter Singh, male   DOB: 21-Jul-1931, 83 y.o.   MRN: [id number]  Provider:  Rexene Edison. Mariea Clonts, D.O., C.M.D. Location:  [place] Room Number: [place] of Service:  SNF (31)  PCP: Precious Reel, MD Patient Care Team: [name], [name]  Extended Emergency Contact Information Primary Emergency Contact: Casa,[name] Address: [address]          [place], [place] [address] [name] of [place] Phone: [phone] Mobile Phone: [phone] Relation: Spouse Secondary Emergency Contact: Marcy,Carlas Address: [place] [address] [name] of [place] Phone: [phone] Mobile Phone: [phone] Relation: Son  Code Status: DNR Goals of Care: Advanced Directive information Advanced Directives 10/14/2016  Does Patient Have a Medical Advance Directive? Yes  Type of Advance Directive Out of facility DNR (pink MOST or yellow form);Living will;Healthcare Power of Attorney  Does patient want to make changes to medical advance directive? -  Copy of [place] in Chart? Yes  Pre-existing out of facility DNR order (yellow form or pink MOST form) Yellow form placed in chart (order not valid for inpatient use);Pink MOST form placed in chart (order not valid for inpatient use)    Chief Complaint  Patient presents with  . New Admit To SNF    Rehab admission    HPI: Patient is a 83 y[place]o. [place] with h/o afib on chronic anticoagulation, essential htn, hyperlipidemia, peripheral neuropathy, and dementia seen today for admission to [facility] rehab s/p hospitalization for sepsis secondary to a urinary tract infection.    His weight dropped from 283 lbs  at admission to hospital to 272 lbs at discharge.   Discharged here with orders for PT/INR and bmp on 10/13/16, urology f/u appt, sterile catheterizations.  HCTZ was placed on hold due to  increased creatinine with suggestion of adding an alternative agent if bp readings were running high.  He's on a heart healthy diet.    At baseline, he ambulates with a walker.  He's had urinary retention and was self-cathing.  He was admitted 4/3 with fever, weakness and confusion.  He had recently completed a course of oral doxy for UTI.  He'd also seen his urologist for hematuria which had resolved.   His UA was abnormal at hospital admission and he was found to have sepsis with elevated lactic acid. He was started on broad spectrum abx including IV vanc and zosyn. Blood cultures were negative.  Urine cx grew out klebsiella.  Abx were changed to ceftriaxone and he improved clinically.  He was then transitioned to vantin 4/6 at time of d/c for 7 more days.    He did have some afib with RVR initially due to his sepsis.  It returned to baseline and he is not on rate-limiting drugs.  He is on warfarin for his anticoagulation and was subtherapeutic at admission.  INR was 1.9 on 4/6 with plans for recheck 4/9--was 2.0.    He also had acute renal failure possibly due to vanc--cr peaked at 1.6.  He was rehydrated and creatinine was improving at 1.56 at discharge 4/6.  Had repeat BMP yesterday that was normal with cr 1.1.    HTN was well controlled but hctz stopped so needs monitoring.  Hyperlipidemia is on statin therapy.  Peripheral neuropathy followed by neurology.  Delirium:  Confusion improved with resolution of sepsis.  Has baseline dementia on aricept.  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  Platelets 60 at discharge here.  Alcohol use:  Said to drink 2 bourbon drinks per night.  He did not have signs of withdrawal outside of his delirium itself.    Upon review of his imaging studies, there was mention of an incidental nodule on his right kidney that is part of a larger nodule and concerning for neoplasm.  This was  not mentioned in d/c summary, but per some notes from Dr. Virgina Jock received in 11/17, Dr. Diona Fanti was already following patient for this.  Was to have new CT early this year.    4/9, he went out to urology, Dr. Diona Fanti, who recommended he complete the vantin course, get a bmp (already done in am but not back at time of that appt), f/u in 3 wks with him for urine recheck, and to reinstitute bid self-catheterization in the am and before bed.    Past Medical History:  Diagnosis Date  . Atrial fibrillation ([place])   . Diverticulosis of colon (without mention of hemorrhage)   . Hyperlipemia   . Hypertension   . Melanoma ([place])   . Personal history of colonic polyps 1999 & 2004   adenomatous polyps  . Ventricular hypertrophy    Past Surgical History:  Procedure Laterality Date  . KNEE SURGERY     right  . PILONIDAL CYST DRAINAGE    . schwanoma tumor lumbar spine    . SPINE SURGERY     tumor removed  . TONSILLECTOMY AND ADENOIDECTOMY      reports that he quit smoking about 41 years ago. His smoking use included Cigarettes. He smoked 3.00 packs per day. He has never used smokeless tobacco. He reports that he drinks alcohol. He reports that he does not use drugs. Social History   Social History  . Marital status: Married    Spouse name: N/A  . Number of children: 2  . Years of education: N/A   Occupational History  . retired    Social History Main Topics  . Smoking status: Former Smoker    Packs/day: 3.00    Types: Cigarettes    Quit date: 06/09/1975  . Smokeless tobacco: Never Used  . Alcohol use 0.0 oz/week     Comment: 2 drinks per day  . Drug use: No  . Sexual activity: Not on file   Other Topics Concern  . Not on file   Social History Narrative   Patient is married with 2 children    Patient is retired    Patient lives at [place] History  Problem Relation Age of Onset  . Stroke Father   . Neuropathy Neg Hx     Health Maintenance  Topic Date Due    . PNA vac Low Risk Adult (1 of 2 - PCV13) 04/03/1999  . INFLUENZA VACCINE  02/04/2017  . TETANUS/TDAP  01/06/2022    Allergies  Allergen Reactions  . Penicillins Rash    Has patient had a PCN reaction causing immediate rash, facial/tongue/throat swelling, SOB or lightheadedness with hypotension: unknown Has patient had a PCN reaction causing severe rash involving mucus membranes or skin necrosis: unknown Has patient had a PCN reaction that required hospitalization : unknown Has patient had a PCN reaction occurring within the last 10 years: no If all of the above answers are "NO", then may proceed with  Cephalosporin use.     Allergies as of 10/14/2016      Reactions   Penicillins Rash   Has patient had a PCN reaction causing immediate rash, facial/tongue/throat swelling, SOB or lightheadedness with hypotension: unknown Has patient had a PCN reaction causing severe rash involving mucus membranes or skin necrosis: unknown Has patient had a PCN reaction that required hospitalization : unknown Has patient had a PCN reaction occurring within the last 10 years: no If all of the above answers are "NO", then may proceed with Cephalosporin use.      Medication List       Accurate as of 10/14/16 10:22 AM. Always use your most recent med list.          acetaminophen 500 MG tablet Commonly known as:  TYLENOL Take 500 mg by mouth every morning. Document area of discomfort and effectiveness of medication.   atorvastatin 20 MG tablet Commonly known as:  LIPITOR Take 20 mg by mouth every evening.   cefpodoxime 100 MG tablet Commonly known as:  VANTIN Take 1 tablet (100 mg total) by mouth every 12 (twelve) hours.   donepezil 10 MG tablet Commonly known as:  ARICEPT Take 10 mg by mouth daily.   folic acid 1 MG tablet Commonly known as:  FOLVITE Take 1 tablet (1 mg total) by mouth daily.   oseltamivir 30 MG capsule Commonly known as:  TAMIFLU Take 30 mg by mouth daily.    tamsulosin 0.4 MG Caps capsule Commonly known as:  FLOMAX Take 0.8 mg by mouth daily after supper.   thiamine 100 MG tablet Take 1 tablet (100 mg total) by mouth daily.   warfarin 7.5 MG tablet Commonly known as:  COUMADIN Take 7.5 mg by mouth as directed. By the coumadin clinic      Review of Systems  Constitutional: Positive for activity change and unexpected weight change. Negative for chills and fever.  HENT: Negative for congestion.   Eyes: Negative for visual disturbance.  Respiratory: Negative for chest tightness and shortness of breath.   Cardiovascular: Positive for palpitations. Negative for chest pain and leg swelling.  Gastrointestinal: Negative for abdominal pain.  Genitourinary:       Urinary retention with I/O caths  Musculoskeletal: Positive for gait problem. Negative for arthralgias.  Neurological: Positive for numbness. Negative for dizziness.  Psychiatric/Behavioral: Positive for confusion.    Vitals:   10/14/16 1012  BP: (!) 166/73  Pulse: 62  Resp: 20  Temp: 97.1 F (36.2 C)  TempSrc: Oral  SpO2: 97%  Weight: 283 lb (128.4 kg)   Body mass index is 31.88 kg/m. Physical Exam  Constitutional: He appears well-developed and well-nourished. No distress.  Cardiovascular: Intact distal pulses.   irreg irreg  Pulmonary/Chest: Effort normal and breath sounds normal. No respiratory distress. He has no wheezes. He has no rales.  Abdominal: Soft. Bowel sounds are normal. He exhibits no distension. There is no tenderness.  Musculoskeletal: Normal range of motion.  Neurological: He is alert.  Poor historian about hospital events  Skin: Skin is warm and dry.  Psychiatric: He has a normal mood and affect.    Labs reviewed: Basic Metabolic Panel:  Recent Labs  10/08/16 0311 10/09/16 0338 10/10/16 0620 10/13/16 0300  NA 139 141 143 145  K 3.8 4.1 3.8 3.9  CL 107 108 113*  --   CO2 25 27 25   --   GLUCOSE 121* 126* 110*  --   BUN 21* 24* 25* 17  CREATININE 1.27* 1.60* 1.56* 1.1  CALCIUM 8.6* 8.6* 9.0  --    Liver Function Tests:  Recent Labs  03/12/16 1025 09/17/16 0742 10/07/16 1405  AST 31 22 35  ALT 17 21 22   ALKPHOS 50 77 57  BILITOT 1.6* 1.38* 2.8*  PROT 5.2* 6.6 6.9  ALBUMIN 3.0* 4.0 4.3    Recent Labs  10/07/16 1405  LIPASE 20   No results for input(s): AMMONIA in the last 8760 hours. CBC:  Recent Labs  03/14/16 0607 09/17/16 0742 10/07/16 1405 10/08/16 0311 10/09/16 0338 10/10/16 0620  WBC 3.2* 2.9* 6.3 5.2 3.6* 3.0*  NEUTROABS 2.4 1.4* 6.0  --   --   --   HGB 13.4 17.2* 16.4 14.1 14.1 13.9  HCT 39.8 49.3 46.2 41.7 42.4 41.3  MCV 91.7 92.1 89.7 93.7 94.6 92.0  PLT 61* 91* 64* 62* 52* 60*   Cardiac Enzymes: No results for input(s): CKTOTAL, CKMB, CKMBINDEX, TROPONINI in the last 8760 hours. BNP: Invalid input(s): POCBNP Lab Results  Component Value Date   HGBA1C 6.0 (H) 07/19/2014   Lab Results  Component Value Date   TSH 1.080 07/04/2014   Lab Results  Component Value Date   VITAMINB12 346 11/14/2015   Lab Results  Component Value Date   FOLATE >20.0 11/14/2015   No results found for: IRON, TIBC, FERRITIN  Imaging and Procedures obtained prior to SNF admission: Dg Chest 2 View  Result Date: 10/07/2016 CLINICAL DATA:  Fever, weakness EXAM: CHEST  2 VIEW COMPARISON:  03/11/2016 FINDINGS: Cardiomegaly again noted. Limited study by poor inspiration. No segmental infiltrate or pulmonary edema. Mild basilar atelectasis. Degenerative changes mid and lower thoracic spine IMPRESSION: Limited study by poor inspiration. Mild basilar atelectasis. Cardiomegaly. No segmental infiltrate or pulmonary edema. Electronically Signed   By: Lahoma Crocker M.D.   On: 10/07/2016 14:46    Assessment/Plan 1. Sepsis secondary to UTI ([place]) -resolved  2. Acute renal failure, unspecified acute renal failure type ([place]) -creatinine improved to baseline -cont hydration, push po fluids, avoid nephrotoxic agents  like nsaids   3. Delirium superimposed on dementia -improved to baseline per family and staff  4. Hereditary and idiopathic peripheral neuropathy -chronic, not on any neurontin or lyrica (?tolerability), but is on folic acid and thiamine due to alcohol abuse  5. Other hyperlipidemia -continue on lipitor therapy -PCP records not in epic for last LDL  6. Thrombocytopenia ([place]) -likely mix of his alcohol abuse and recent illness--last platelets were 60, monitor for drop and bleeding concerns  7. Renal mass, right -cont to monitor this as outpatient  8. Essential hypertension -bp was elevated this am, if continues over 150 in this environment, would adjust medications  9. Urinary retention due to benign prostatic hyperplasia -cont catheterization and f/u with urology  10. Persistent atrial fibrillation ([facility]) -RVR resolved, not actively on rate control meds, is on coumadin for this hypercoagulable state which could get interesting with his low platelets and alcohol use  11. Chronic anticoagulation -cont coumadin therapy, INR 4/9 was 2.0 here with goal 2-3  Family/ staff Communication:   Discussed with rehab nurse  Labs/tests ordered:  Recheck INR prior to d/c from rehab  [place]. Pearlene Teat, D.O. [facility] [address], [place] [address] Cell Phone (Mon-Fri 8am-5pm):  [phone] On Call:  [phone] & follow prompts after 5pm & weekends Office Phone:  [phone] Office Fax:  [phone]

## 2016-10-15 DIAGNOSIS — Z9181 History of falling: Secondary | ICD-10-CM | POA: Diagnosis not present

## 2016-10-15 DIAGNOSIS — R2681 Unsteadiness on feet: Secondary | ICD-10-CM | POA: Diagnosis not present

## 2016-10-15 DIAGNOSIS — G609 Hereditary and idiopathic neuropathy, unspecified: Secondary | ICD-10-CM | POA: Diagnosis not present

## 2016-10-15 DIAGNOSIS — A4189 Other specified sepsis: Secondary | ICD-10-CM | POA: Diagnosis not present

## 2016-10-15 DIAGNOSIS — R278 Other lack of coordination: Secondary | ICD-10-CM | POA: Diagnosis not present

## 2016-10-15 DIAGNOSIS — R2689 Other abnormalities of gait and mobility: Secondary | ICD-10-CM | POA: Diagnosis not present

## 2016-10-16 DIAGNOSIS — A4189 Other specified sepsis: Secondary | ICD-10-CM | POA: Diagnosis not present

## 2016-10-16 DIAGNOSIS — R2681 Unsteadiness on feet: Secondary | ICD-10-CM | POA: Diagnosis not present

## 2016-10-16 DIAGNOSIS — I4891 Unspecified atrial fibrillation: Secondary | ICD-10-CM | POA: Diagnosis not present

## 2016-10-16 DIAGNOSIS — G609 Hereditary and idiopathic neuropathy, unspecified: Secondary | ICD-10-CM | POA: Diagnosis not present

## 2016-10-16 DIAGNOSIS — R278 Other lack of coordination: Secondary | ICD-10-CM | POA: Diagnosis not present

## 2016-10-16 DIAGNOSIS — Z9181 History of falling: Secondary | ICD-10-CM | POA: Diagnosis not present

## 2016-10-16 DIAGNOSIS — Z5181 Encounter for therapeutic drug level monitoring: Secondary | ICD-10-CM | POA: Diagnosis not present

## 2016-10-16 DIAGNOSIS — R2689 Other abnormalities of gait and mobility: Secondary | ICD-10-CM | POA: Diagnosis not present

## 2016-10-17 DIAGNOSIS — R2689 Other abnormalities of gait and mobility: Secondary | ICD-10-CM | POA: Diagnosis not present

## 2016-10-17 DIAGNOSIS — R278 Other lack of coordination: Secondary | ICD-10-CM | POA: Diagnosis not present

## 2016-10-17 DIAGNOSIS — G609 Hereditary and idiopathic neuropathy, unspecified: Secondary | ICD-10-CM | POA: Diagnosis not present

## 2016-10-17 DIAGNOSIS — Z9181 History of falling: Secondary | ICD-10-CM | POA: Diagnosis not present

## 2016-10-17 DIAGNOSIS — A4189 Other specified sepsis: Secondary | ICD-10-CM | POA: Diagnosis not present

## 2016-10-17 DIAGNOSIS — R2681 Unsteadiness on feet: Secondary | ICD-10-CM | POA: Diagnosis not present

## 2016-10-20 DIAGNOSIS — Z9181 History of falling: Secondary | ICD-10-CM | POA: Diagnosis not present

## 2016-10-20 DIAGNOSIS — R278 Other lack of coordination: Secondary | ICD-10-CM | POA: Diagnosis not present

## 2016-10-20 DIAGNOSIS — R2681 Unsteadiness on feet: Secondary | ICD-10-CM | POA: Diagnosis not present

## 2016-10-20 DIAGNOSIS — A4189 Other specified sepsis: Secondary | ICD-10-CM | POA: Diagnosis not present

## 2016-10-20 DIAGNOSIS — R2689 Other abnormalities of gait and mobility: Secondary | ICD-10-CM | POA: Diagnosis not present

## 2016-10-20 DIAGNOSIS — G609 Hereditary and idiopathic neuropathy, unspecified: Secondary | ICD-10-CM | POA: Diagnosis not present

## 2016-10-21 DIAGNOSIS — G609 Hereditary and idiopathic neuropathy, unspecified: Secondary | ICD-10-CM | POA: Diagnosis not present

## 2016-10-21 DIAGNOSIS — A4189 Other specified sepsis: Secondary | ICD-10-CM | POA: Diagnosis not present

## 2016-10-21 DIAGNOSIS — R339 Retention of urine, unspecified: Secondary | ICD-10-CM | POA: Diagnosis not present

## 2016-10-21 DIAGNOSIS — R2689 Other abnormalities of gait and mobility: Secondary | ICD-10-CM | POA: Diagnosis not present

## 2016-10-21 DIAGNOSIS — A419 Sepsis, unspecified organism: Secondary | ICD-10-CM | POA: Diagnosis not present

## 2016-10-21 DIAGNOSIS — R531 Weakness: Secondary | ICD-10-CM | POA: Diagnosis not present

## 2016-10-21 DIAGNOSIS — R278 Other lack of coordination: Secondary | ICD-10-CM | POA: Diagnosis not present

## 2016-10-21 DIAGNOSIS — Z9181 History of falling: Secondary | ICD-10-CM | POA: Diagnosis not present

## 2016-10-21 DIAGNOSIS — R5383 Other fatigue: Secondary | ICD-10-CM | POA: Diagnosis not present

## 2016-10-21 DIAGNOSIS — S37009D Unspecified injury of unspecified kidney, subsequent encounter: Secondary | ICD-10-CM | POA: Diagnosis not present

## 2016-10-21 DIAGNOSIS — N401 Enlarged prostate with lower urinary tract symptoms: Secondary | ICD-10-CM | POA: Diagnosis not present

## 2016-10-21 DIAGNOSIS — Z7901 Long term (current) use of anticoagulants: Secondary | ICD-10-CM | POA: Diagnosis not present

## 2016-10-21 DIAGNOSIS — N390 Urinary tract infection, site not specified: Secondary | ICD-10-CM | POA: Diagnosis not present

## 2016-10-21 DIAGNOSIS — I480 Paroxysmal atrial fibrillation: Secondary | ICD-10-CM | POA: Diagnosis not present

## 2016-10-21 DIAGNOSIS — R2681 Unsteadiness on feet: Secondary | ICD-10-CM | POA: Diagnosis not present

## 2016-10-22 ENCOUNTER — Telehealth: Payer: Self-pay | Admitting: Pharmacist

## 2016-10-22 DIAGNOSIS — R2681 Unsteadiness on feet: Secondary | ICD-10-CM | POA: Diagnosis not present

## 2016-10-22 DIAGNOSIS — R278 Other lack of coordination: Secondary | ICD-10-CM | POA: Diagnosis not present

## 2016-10-22 DIAGNOSIS — G609 Hereditary and idiopathic neuropathy, unspecified: Secondary | ICD-10-CM | POA: Diagnosis not present

## 2016-10-22 DIAGNOSIS — R2689 Other abnormalities of gait and mobility: Secondary | ICD-10-CM | POA: Diagnosis not present

## 2016-10-22 DIAGNOSIS — Z9181 History of falling: Secondary | ICD-10-CM | POA: Diagnosis not present

## 2016-10-22 DIAGNOSIS — A4189 Other specified sepsis: Secondary | ICD-10-CM | POA: Diagnosis not present

## 2016-10-22 LAB — POCT INR: INR: 2

## 2016-10-22 NOTE — Telephone Encounter (Signed)
Pt wife called to report that pt. Had INR drawn at PCP office yesterday, who was supposed to fax results. LM on nurse line at Dr. Keane Police office requesting INR to be faxed.

## 2016-10-23 ENCOUNTER — Ambulatory Visit (INDEPENDENT_AMBULATORY_CARE_PROVIDER_SITE_OTHER): Payer: Medicare Other | Admitting: Interventional Cardiology

## 2016-10-23 DIAGNOSIS — I4819 Other persistent atrial fibrillation: Secondary | ICD-10-CM

## 2016-10-23 DIAGNOSIS — I481 Persistent atrial fibrillation: Secondary | ICD-10-CM

## 2016-10-23 NOTE — Telephone Encounter (Signed)
See anticoag note from 4/19 for details.

## 2016-10-23 NOTE — Telephone Encounter (Signed)
Called Dr. Keane Police office to follow up and transferred to DJs VM box. LM about getting INR results.

## 2016-10-29 DIAGNOSIS — N3000 Acute cystitis without hematuria: Secondary | ICD-10-CM | POA: Diagnosis not present

## 2016-11-03 DIAGNOSIS — R278 Other lack of coordination: Secondary | ICD-10-CM | POA: Diagnosis not present

## 2016-11-03 DIAGNOSIS — R2689 Other abnormalities of gait and mobility: Secondary | ICD-10-CM | POA: Diagnosis not present

## 2016-11-03 DIAGNOSIS — R2681 Unsteadiness on feet: Secondary | ICD-10-CM | POA: Diagnosis not present

## 2016-11-03 DIAGNOSIS — Z9181 History of falling: Secondary | ICD-10-CM | POA: Diagnosis not present

## 2016-11-03 DIAGNOSIS — A4189 Other specified sepsis: Secondary | ICD-10-CM | POA: Diagnosis not present

## 2016-11-03 DIAGNOSIS — G609 Hereditary and idiopathic neuropathy, unspecified: Secondary | ICD-10-CM | POA: Diagnosis not present

## 2016-11-05 ENCOUNTER — Encounter: Payer: Self-pay | Admitting: Neurology

## 2016-11-05 ENCOUNTER — Encounter (INDEPENDENT_AMBULATORY_CARE_PROVIDER_SITE_OTHER): Payer: Self-pay

## 2016-11-05 ENCOUNTER — Ambulatory Visit (INDEPENDENT_AMBULATORY_CARE_PROVIDER_SITE_OTHER): Payer: Medicare Other | Admitting: Neurology

## 2016-11-05 VITALS — BP 122/73 | HR 56 | Ht 79.0 in | Wt 243.0 lb

## 2016-11-05 DIAGNOSIS — E559 Vitamin D deficiency, unspecified: Secondary | ICD-10-CM

## 2016-11-05 DIAGNOSIS — E538 Deficiency of other specified B group vitamins: Secondary | ICD-10-CM | POA: Diagnosis not present

## 2016-11-05 DIAGNOSIS — G3184 Mild cognitive impairment, so stated: Secondary | ICD-10-CM | POA: Diagnosis not present

## 2016-11-05 MED ORDER — GABAPENTIN 300 MG PO CAPS: 300.0000 mg | ORAL_CAPSULE | Freq: Every day | ORAL | 6 refills | Status: DC

## 2016-11-05 MED ORDER — MEMANTINE HCL 5 MG PO TABS: ORAL_TABLET | ORAL | 12 refills | Status: DC

## 2016-11-05 NOTE — Progress Notes (Signed)
[facility]    Provider:  Dr Jaynee Eagles Referring Provider: [name], MD Primary Care Physician:  Precious Reel, MD   CC: Cognitive changes  Interval history 11/05/2016: He is using a walker. Feels more steady. He continues to have short-term memory loss and repeats things more often. Here with his wife who provides more information. Discussed clinical trials and dementia, discussed mild cognitive impairment (he was diagnosed via neurocognitive testing with Dr Valentina Shaggy in 2016), and discussed other specialized imaging to help evaluate with memory changes and diagnosis as well as repeating formal neurocognitive testing. Patient and wife are not interested, especially since it will likely not change management. We'll start patient on Namenda. Also discussed increasing Aricept. Discussed mild cognitive impairment and that a third people will progress to dementia. Follow-up 1 year.  Interval history: He is here with his wife. More short term memory loss. Wife is paying all the bills now. Wife is here and provides information. They saw Dr. Valentina Shaggy last year. Discussed repeat testing if needed. He is not playing golf anymore. No significant snoring. No excessive daytime somnolence, sometimes he naps. He hasn't exercised as much. He has numbness in his feet, getting number, getting worse. No pain just discomfort. Slowly progressive still with the memory but nothing significant.  He has worsening short term memory loss. Memory loss is progressive.He repeats things multiple times. [facility] has gotten a liquor license.   HPI: Peter Singh is a 83 y.o. male here as a referral from Dr. Virgina Jock for memory loss. Having general loss of memory and forgetting names, asking the same thing over and over, forgetting appointments and names. Changes started several years ahgo, unsure when and are progressive. Wife accompanies and provides history. 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 It was  benign. He is excessively tired tired during the day, he naps in the afternoons, some snoring at night. No witnessed apneic events. In his feet, he gets sharp pains in the feet. A few times he has gotten lost in the car. Was previously evaluated in my clinic for neuropathy, this is a new complaint. Patient has 2 drinks of bourbon a night, wife confirms. His recall of remote events appears intact, can recite the address of where he lived as achild. Appears to be more recent memories. He is still living independently with his wife and performs all his ADLs and IADLs. No other focal neurologic deficits. He has neuropathy. No FHx of neurodegenerative disease. He continues to have worsening numbness in the distal extremities.   Reviewed notes, labs and imaging from outside physicians, which showed: hgba1c 6.0, TSH wnl, B12 WNL, folate WNL, Other normal labs include: IFE, Pan-ANCA, ace, hiv, esr, ana, rpr, hepc ab, crp, paraneoplastic ab panel, RF, B1, ethanol (neg), mma. No etiology for neuropathy found except glucose intolerance and possibly alcohol use. EMG/NCS showed severe length-dependent axonal polyneuropathy.   Previous visit 07/04/2014 HPI: Peter Singh is a 83 y.o. male here as a referral from Dr. Virgina Jock for Neuropathy  Neuropathy started 4-5 years ago. No inciting factors, no medications that are know to cause neuropathy, no chemotherapy. He went to [facility] and they found a growth in his spinal column. He doesn't have any feeling in the tips of his fingers and in his feet, just numb. No tingling or burning. He is having fine motor skill difficulties. Dropping things. In all the fingers possibly in the whole hands. No cramping at night. Is the same all the time, continuous all  day. It doesn't vary too much, not painful just numb. The changes go up to the ankles. Progressive. No burning just numbness. Balance is poor. He has to be careful to stop from falling. Stumbling. Has 2 drinks a day of bourbon.  No chemotherapy, no long-term usage of medications that can cause neuropathy. 2 drinks of bourbon a day. No other focal neurologic symptoms. No FHx of neuropathy.   Reviewed notes, labs and imaging from outside physicians, which showed:   Ct showed No acute intracranial abnormalities including mass lesion or mass effect, hydrocephalus, extra-axial fluid collection, midline shift, hemorrhage, or acute infarction, large ischemic events (personally reviewed images), did show chronic mild global atrophy as well as chronic ischemic white matter non-specific changes. Last INR was 2.7.  Review of Systems: Patient complains of symptoms per HPI as well as the following symptoms: numbness. Denies CP or SOB Pertinent negatives per HPI. All others negative.   Social History   Social History  . Marital status: Married    Spouse name: N/A  . Number of children: 2  . Years of education: N/A   Occupational History  . retired    Social History Main Topics  . Smoking status: Former Smoker    Packs/day: 3.00    Types: Cigarettes    Quit date: 06/09/1975  . Smokeless tobacco: Never Used  . Alcohol use 0.0 oz/week     Comment: 2 drinks per day  . Drug use: No  . Sexual activity: Not on file   Other Topics Concern  . Not on file   Social History Narrative   Patient is married with 2 children    Patient is retired    Patient lives at [place] History  Problem Relation Age of Onset  . Stroke Father   . Neuropathy Neg Hx     Past Medical History:  Diagnosis Date  . Atrial fibrillation ([place])   . Diverticulosis of colon (without mention of hemorrhage)   . Hyperlipemia   . Hypertension   . Melanoma ([place])   . Personal history of colonic polyps 1999 & 2004   adenomatous polyps  . Ventricular hypertrophy     Past Surgical History:  Procedure Laterality Date  . KNEE SURGERY     right  . PILONIDAL CYST DRAINAGE    . schwanoma tumor lumbar spine    . SPINE SURGERY      tumor removed  . TONSILLECTOMY AND ADENOIDECTOMY      Current Outpatient Prescriptions  Medication Sig Dispense Refill  . acetaminophen (TYLENOL) 500 MG tablet Take 500 mg by mouth every morning. Document area of discomfort and effectiveness of medication.     Marland Kitchen atorvastatin (LIPITOR) 20 MG tablet Take 20 mg by mouth every evening.     . donepezil (ARICEPT) 10 MG tablet Take 10 mg by mouth daily.     . folic acid (FOLVITE) 1 MG tablet Take 1 tablet (1 mg total) by mouth daily.    . Tamsulosin HCl (FLOMAX) 0.4 MG CAPS Take 0.8 mg by mouth daily after supper.     . thiamine 100 MG tablet Take 1 tablet (100 mg total) by mouth daily.    Marland Kitchen warfarin (COUMADIN) 7.5 MG tablet Take 7.5 mg by mouth as directed. By the coumadin clinic    . gabapentin (NEURONTIN) 300 MG capsule Take 1 capsule (300 mg total) by mouth at bedtime. 90 capsule 6  . memantine (NAMENDA) 5 MG tablet Take one pill  in the morning for 2 weeks and then increase to 1 pill two times a day 60 tablet 12   No current facility-administered medications for this visit.     Allergies as of 11/05/2016 - Review Complete 11/05/2016  Allergen Reaction Noted  . Penicillins Rash 04/08/2011    Vitals: BP 122/73   Pulse (!) 56   Ht '6\' 7"'  (2.007 m)   Wt 243 lb (110.2 kg)   BMI 27.38 kg/m  Last Weight:  Wt Readings from Last 1 Encounters:  11/05/16 243 lb (110.2 kg)   Last Height:   Ht Readings from Last 1 Encounters:  11/05/16 '6\' 7"'  (2.007 m)    Physical exam: Exam: Gen: NAD, conversant, well nourised, obese, well groomed  CV: RRR, no MRG. No Carotid Bruits. Eyes: Conjunctivae clear without exudates or hemorrhage  Neuro: No changes Detailed Neurologic Exam  Speech:  Speech is normal; fluent and spontaneous with normal comprehension.  Cognition:  MMSE - Mini Mental State Exam 11/05/2016  Orientation to time 5  Orientation to Place 5  Registration 3  Attention/ Calculation 5  Recall 1  Language-  name 2 objects 2  Language- repeat 1  Language- follow 3 step command 3  Language- read & follow direction 1  Write a sentence 1  Copy design 1  Total score 28    The patient is oriented to person, place, month/year;   recent memory impaired, remote memory intact;   language fluent; normal attention, concentration, fund of knowledge Cranial Nerves:  The pupils are equal, round, and reactive to light. Extraocular movements are intact. Trigeminal sensation is intact and the muscles of mastication are normal. The face is symmetric. The palate elevates in the midline. Hearing intact. Voice is normal. Shoulder shrug is normal. The tongue has normal motion without fasciculations.   Gait:  Bradykinetic with a walker   Motor Observation:  No asymmetry, no atrophy, and no involuntary movements noted. Tone:  Normal muscle tone.     Strength:  Strength is V/V in the upper and lower limbs.    Sensation:  (Stable) can't feel vibration in the toes, malleoli or knees. Can feel vibration in the fingers DIP.  Pp and temp decreased to elbows and above knees Proprioception absent in the great toes.  Reflex Exam:  DTR's:  Absent achilles    Assessment/Plan: 83 year old lovely male PMHx of afib on coumadin, HTN, HLD who is here for cognitive difficulties. Was evaluated previously for progressive numbness in the toes and fingers. Exam with decrease to all modalities in glove and stocking distribution. Formal neurocognitive testing 2 years ago diagnosed with mild cognitive impairment, we discussed that today as well as possible clinical trials, specialized imaging and other modalities of diagnosis and treatment of memory loss. They're not interested in further clinical research or specialized imaging or repeat neurocognitive testing but we will start Namenda and possibly increase Aricept in the future.  Neuropathy: No etiology for neuropathy found except  glucose intolerance and possibly alcohol use. EMG/NCS showed severe length-dependent axonal polyneuropathy. Reports 2 drinks of bourbon a night, discussed that 14 drinks a week is the recommended limit for men and alcohol can be directly toxic to nerves. Will order specialty test anti-mag (myelin-associated glycoprotein) antibody.  Cognitive changes: MCI. We'll also recheck B12 and vitamin D levels as both of these have been implicated in memory loss.  Sarina Ill, MD  [facility] [address] [place], [place] [address]  Phone [phone] Fax  [phone]  A total of 35 minutes was spent face-to-face with this patient. Over half this time was spent on counseling patient on the neuropathy, mci diagnosis and different diagnostic and therapeutic options available.

## 2016-11-05 NOTE — Patient Instructions (Addendum)
Remember to drink plenty of fluid, eat healthy meals and do not skip any meals. Try to eat protein with a every meal and eat a healthy snack such as fruit or nuts in between meals. Try to keep a regular sleep-wake schedule and try to exercise daily, particularly in the form of walking, 20-30 minutes a day, if you can.   As far as your medications are concerned, I would like to suggest: Gabapentin as needed at bedtime.   Start Namenda (Memantine) once daily and then in 2 weeks increase to one pill twice daily. In one month can call and I will increase the prescription to 10mg  twice a day.  I would like to see you back in 6 months, sooner if we need to. Please call us with any interim questions, concerns, problems, updates or refill requests.   Our phone number is [phone]. We also have an after hours call service for urgent matters and there is a physician on-call for urgent questions. For any emergencies you know to call 911 or go to the nearest emergency room  Gabapentin capsules or tablets What is this medicine? GABAPENTIN (GA ba pen tin) is used to control partial seizures in adults with epilepsy. It is also used to treat certain types of nerve pain. This medicine may be used for other purposes; ask your health care provider or pharmacist if you have questions. COMMON BRAND NAME(S): Active-PAC with Gabapentin, Gabarone, Neurontin What should I tell my health care provider before I take this medicine? They need to know if you have any of these conditions: -kidney disease -suicidal thoughts, plans, or attempt; a previous suicide attempt by you or a family member -an unusual or allergic reaction to gabapentin, other medicines, foods, dyes, or preservatives -pregnant or trying to get pregnant -breast-feeding How should I use this medicine? Take this medicine by mouth with a glass of water. Follow the directions on the prescription label. You can take it with or without food. If it upsets  your stomach, take it with food.Take your medicine at regular intervals. Do not take it more often than directed. Do not stop taking except on your doctor's advice. If you are directed to break the 600 or 800 mg tablets in half as part of your dose, the extra half tablet should be used for the next dose. If you have not used the extra half tablet within 28 days, it should be thrown away. A special MedGuide will be given to you by the pharmacist with each prescription and refill. Be sure to read this information carefully each time. Talk to your pediatrician regarding the use of this medicine in children. Special care may be needed. Overdosage: If you think you have taken too much of this medicine contact a poison control center or emergency room at once. NOTE: This medicine is only for you. Do not share this medicine with others. What if I miss a dose? If you miss a dose, take it as soon as you can. If it is almost time for your next dose, take only that dose. Do not take double or extra doses. What may interact with this medicine? Do not take this medicine with any of the following medications: -other gabapentin products This medicine may also interact with the following medications: -alcohol -antacids -antihistamines for allergy, cough and cold -certain medicines for anxiety or sleep -certain medicines for depression or psychotic disturbances -homatropine; hydrocodone -naproxen -narcotic medicines (opiates) for pain -phenothiazines like chlorpromazine, mesoridazine, prochlorperazine, thioridazine This list  may not describe all possible interactions. Give your health care provider a list of all the medicines, herbs, non-prescription drugs, or dietary supplements you use. Also tell them if you smoke, drink alcohol, or use illegal drugs. Some items may interact with your medicine. What should I watch for while using this medicine? Visit your doctor or health care professional for regular checks  on your progress. You may want to keep a record at home of how you feel your condition is responding to treatment. You may want to share this information with your doctor or health care professional at each visit. You should contact your doctor or health care professional if your seizures get worse or if you have any new types of seizures. Do not stop taking this medicine or any of your seizure medicines unless instructed by your doctor or health care professional. Stopping your medicine suddenly can increase your seizures or their severity. Wear a medical identification bracelet or chain if you are taking this medicine for seizures, and carry a card that lists all your medications. You may get drowsy, dizzy, or have blurred vision. Do not drive, use machinery, or do anything that needs mental alertness until you know how this medicine affects you. To reduce dizzy or fainting spells, do not sit or stand up quickly, especially if you are an older patient. Alcohol can increase drowsiness and dizziness. Avoid alcoholic drinks. Your mouth may get dry. Chewing sugarless gum or sucking hard candy, and drinking plenty of water will help. The use of this medicine may increase the chance of suicidal thoughts or actions. Pay special attention to how you are responding while on this medicine. Any worsening of mood, or thoughts of suicide or dying should be reported to your health care professional right away. Women who become pregnant while using this medicine may enroll in the [place] Pregnancy Registry by calling [phone]. This registry collects information about the safety of antiepileptic drug use during pregnancy. What side effects may I notice from receiving this medicine? Side effects that you should report to your doctor or health care professional as soon as possible: -allergic reactions like skin rash, itching or hives, swelling of the face, lips, or tongue -worsening of mood,  thoughts or actions of suicide or dying Side effects that usually do not require medical attention (report to your doctor or health care professional if they continue or are bothersome): -constipation -difficulty walking or controlling muscle movements -dizziness -nausea -slurred speech -tiredness -tremors -weight gain This list may not describe all possible side effects. Call your doctor for medical advice about side effects. You may report side effects to FDA at 1-800-FDA-1088. Where should I keep my medicine? Keep out of reach of children. This medicine may cause accidental overdose and death if it taken by other adults, children, or pets. Mix any unused medicine with a substance like cat litter or coffee grounds. Then throw the medicine away in a sealed container like a sealed bag or a coffee can with a lid. Do not use the medicine after the expiration date. Store at room temperature between 15 and 30 degrees C (59 and 86 degrees F). NOTE: This sheet is a summary. It may not cover all possible information. If you have questions about this medicine, talk to your doctor, pharmacist, or health care provider.  2018 Elsevier/Gold Standard (2013-08-19 15:26:50)   Memantine Tablets What is this medicine? MEMANTINE (MEM an teen) is used to treat dementia caused by Alzheimer's disease. This  medicine may be used for other purposes; ask your health care provider or pharmacist if you have questions. COMMON BRAND NAME(S): Namenda What should I tell my health care provider before I take this medicine? They need to know if you have any of these conditions: -difficulty passing urine -kidney disease -liver disease -seizures -an unusual or allergic reaction to memantine, other medicines, foods, dyes, or preservatives -pregnant or trying to get pregnant -breast-feeding How should I use this medicine? Take this medicine by mouth with a glass of water. Follow the directions on the prescription  label. You may take this medicine with or without food. Take your doses at regular intervals. Do not take your medicine more often than directed. Continue to take your medicine even if you feel better. Do not stop taking except on the advice of your doctor or health care professional. Talk to your pediatrician regarding the use of this medicine in children. Special care may be needed. Overdosage: If you think you have taken too much of this medicine contact a poison control center or emergency room at once. NOTE: This medicine is only for you. Do not share this medicine with others. What if I miss a dose? If you miss a dose, take it as soon as you can. If it is almost time for your next dose, take only that dose. Do not take double or extra doses. If you do not take your medicine for several days, contact your health care provider. Your dose may need to be changed. What may interact with this medicine? -acetazolamide -amantadine -cimetidine -dextromethorphan -dofetilide -hydrochlorothiazide -ketamine -metformin -methazolamide -quinidine -ranitidine -sodium bicarbonate -triamterene This list may not describe all possible interactions. Give your health care provider a list of all the medicines, herbs, non-prescription drugs, or dietary supplements you use. Also tell them if you smoke, drink alcohol, or use illegal drugs. Some items may interact with your medicine. What should I watch for while using this medicine? Visit your doctor or health care professional for regular checks on your progress. Check with your doctor or health care professional if there is no improvement in your symptoms or if they get worse. You may get drowsy or dizzy. Do not drive, use machinery, or do anything that needs mental alertness until you know how this drug affects you. Do not stand or sit up quickly, especially if you are an older patient. This reduces the risk of dizzy or fainting spells. Alcohol can make you  more drowsy and dizzy. Avoid alcoholic drinks. What side effects may I notice from receiving this medicine? Side effects that you should report to your doctor or health care professional as soon as possible: -allergic reactions like skin rash, itching or hives, swelling of the face, lips, or tongue -agitation or a feeling of restlessness -depressed mood -dizziness -hallucinations -redness, blistering, peeling or loosening of the skin, including inside the mouth -seizures -vomiting Side effects that usually do not require medical attention (report to your doctor or health care professional if they continue or are bothersome): -constipation -diarrhea -headache -nausea -trouble sleeping This list may not describe all possible side effects. Call your doctor for medical advice about side effects. You may report side effects to FDA at 1-800-FDA-1088. Where should I keep my medicine? Keep out of the reach of children. Store at room temperature between 15 degrees and 30 degrees C (59 degrees and 86 degrees F). Throw away any unused medicine after the expiration date. NOTE: This sheet is a summary. It may not  cover all possible information. If you have questions about this medicine, talk to your doctor, pharmacist, or health care provider.  2018 Elsevier/Gold Standard (2013-04-11 14:10:42)

## 2016-11-06 DIAGNOSIS — I4891 Unspecified atrial fibrillation: Secondary | ICD-10-CM | POA: Diagnosis not present

## 2016-11-06 LAB — PROTIME-INR: INR: 2.2 — AB (ref 0.9–1.1)

## 2016-11-07 ENCOUNTER — Ambulatory Visit (INDEPENDENT_AMBULATORY_CARE_PROVIDER_SITE_OTHER): Payer: Medicare Other | Admitting: Internal Medicine

## 2016-11-07 DIAGNOSIS — I481 Persistent atrial fibrillation: Secondary | ICD-10-CM

## 2016-11-07 DIAGNOSIS — I4819 Other persistent atrial fibrillation: Secondary | ICD-10-CM

## 2016-11-07 LAB — METHYLMALONIC ACID, SERUM: Methylmalonic Acid: 206 nmol/L (ref 0–378)

## 2016-11-07 LAB — B12 AND FOLATE PANEL
Folate: >20 ng/mL (ref >3.0)
Vitamin B-12: 386 pg/mL (ref 232–1245)

## 2016-11-07 LAB — VITAMIN D 25 HYDROXY (VIT D DEFICIENCY, FRACTURES): VIT D 25 HYDROXY: 11.6 ng/mL — AB (ref 30.0–100.0)

## 2016-11-10 ENCOUNTER — Telehealth: Payer: Self-pay | Admitting: *Deleted

## 2016-11-10 NOTE — Telephone Encounter (Signed)
Pt asking for a call back with clarification on vitamin D supplement

## 2016-11-10 NOTE — Telephone Encounter (Signed)
Per Dr Jaynee Eagles, spoke with patient's wife and informed her his labs are okay except for very low Vit D. Advised Dr Jaynee Eagles Vitamin D is very low he should be taking 2000 Units daily. She stated she would get some right away. She verbalized understanding, appreciation of call.

## 2016-11-11 NOTE — Telephone Encounter (Signed)
Called patient, wife answered, on DPR. She inquired if Vit D3 2000 units was okay for patient to take as supplement. This RN advised her it is fine. She verbalized understanding, appreciation for call back.

## 2016-11-13 ENCOUNTER — Ambulatory Visit: Payer: Medicare Other | Admitting: Neurology

## 2016-11-19 DIAGNOSIS — N3021 Other chronic cystitis with hematuria: Secondary | ICD-10-CM | POA: Diagnosis not present

## 2016-11-19 DIAGNOSIS — R3914 Feeling of incomplete bladder emptying: Secondary | ICD-10-CM | POA: Diagnosis not present

## 2016-11-20 DIAGNOSIS — D692 Other nonthrombocytopenic purpura: Secondary | ICD-10-CM | POA: Diagnosis not present

## 2016-11-20 DIAGNOSIS — I480 Paroxysmal atrial fibrillation: Secondary | ICD-10-CM | POA: Diagnosis not present

## 2016-11-20 DIAGNOSIS — Z6832 Body mass index (BMI) 32.0-32.9, adult: Secondary | ICD-10-CM | POA: Diagnosis not present

## 2016-11-20 DIAGNOSIS — I2789 Other specified pulmonary heart diseases: Secondary | ICD-10-CM | POA: Diagnosis not present

## 2016-11-20 DIAGNOSIS — G3184 Mild cognitive impairment, so stated: Secondary | ICD-10-CM | POA: Diagnosis not present

## 2016-11-20 DIAGNOSIS — R627 Adult failure to thrive: Secondary | ICD-10-CM | POA: Diagnosis not present

## 2016-11-20 DIAGNOSIS — N183 Chronic kidney disease, stage 3 (moderate): Secondary | ICD-10-CM | POA: Diagnosis not present

## 2016-11-20 DIAGNOSIS — R7309 Other abnormal glucose: Secondary | ICD-10-CM | POA: Diagnosis not present

## 2016-11-20 DIAGNOSIS — I7389 Other specified peripheral vascular diseases: Secondary | ICD-10-CM | POA: Diagnosis not present

## 2016-11-20 DIAGNOSIS — I700 Atherosclerosis of aorta: Secondary | ICD-10-CM | POA: Diagnosis not present

## 2016-11-20 DIAGNOSIS — D696 Thrombocytopenia, unspecified: Secondary | ICD-10-CM | POA: Diagnosis not present

## 2016-11-20 DIAGNOSIS — N2889 Other specified disorders of kidney and ureter: Secondary | ICD-10-CM | POA: Diagnosis not present

## 2016-11-27 ENCOUNTER — Ambulatory Visit (INDEPENDENT_AMBULATORY_CARE_PROVIDER_SITE_OTHER): Payer: Medicare Other | Admitting: Internal Medicine

## 2016-11-27 DIAGNOSIS — I481 Persistent atrial fibrillation: Secondary | ICD-10-CM

## 2016-11-27 DIAGNOSIS — I4819 Other persistent atrial fibrillation: Secondary | ICD-10-CM

## 2016-11-27 DIAGNOSIS — Z5181 Encounter for therapeutic drug level monitoring: Secondary | ICD-10-CM | POA: Diagnosis not present

## 2016-11-27 DIAGNOSIS — I4891 Unspecified atrial fibrillation: Secondary | ICD-10-CM | POA: Diagnosis not present

## 2016-11-27 LAB — POCT INR: INR: 1.8

## 2016-12-03 DIAGNOSIS — Z85828 Personal history of other malignant neoplasm of skin: Secondary | ICD-10-CM | POA: Diagnosis not present

## 2016-12-03 DIAGNOSIS — D0472 Carcinoma in situ of skin of left lower limb, including hip: Secondary | ICD-10-CM | POA: Diagnosis not present

## 2016-12-03 DIAGNOSIS — D1801 Hemangioma of skin and subcutaneous tissue: Secondary | ICD-10-CM | POA: Diagnosis not present

## 2016-12-03 DIAGNOSIS — L821 Other seborrheic keratosis: Secondary | ICD-10-CM | POA: Diagnosis not present

## 2016-12-03 DIAGNOSIS — C44712 Basal cell carcinoma of skin of right lower limb, including hip: Secondary | ICD-10-CM | POA: Diagnosis not present

## 2016-12-03 DIAGNOSIS — L570 Actinic keratosis: Secondary | ICD-10-CM | POA: Diagnosis not present

## 2016-12-03 DIAGNOSIS — Z85820 Personal history of malignant melanoma of skin: Secondary | ICD-10-CM | POA: Diagnosis not present

## 2016-12-03 DIAGNOSIS — D485 Neoplasm of uncertain behavior of skin: Secondary | ICD-10-CM | POA: Diagnosis not present

## 2016-12-03 DIAGNOSIS — C44311 Basal cell carcinoma of skin of nose: Secondary | ICD-10-CM | POA: Diagnosis not present

## 2016-12-03 DIAGNOSIS — L720 Epidermal cyst: Secondary | ICD-10-CM | POA: Diagnosis not present

## 2016-12-03 DIAGNOSIS — D2339 Other benign neoplasm of skin of other parts of face: Secondary | ICD-10-CM | POA: Diagnosis not present

## 2016-12-09 DIAGNOSIS — N3000 Acute cystitis without hematuria: Secondary | ICD-10-CM | POA: Diagnosis not present

## 2016-12-09 DIAGNOSIS — R350 Frequency of micturition: Secondary | ICD-10-CM | POA: Diagnosis not present

## 2016-12-11 ENCOUNTER — Ambulatory Visit (INDEPENDENT_AMBULATORY_CARE_PROVIDER_SITE_OTHER): Payer: Medicare Other | Admitting: Cardiovascular Disease

## 2016-12-11 DIAGNOSIS — Z5181 Encounter for therapeutic drug level monitoring: Secondary | ICD-10-CM | POA: Diagnosis not present

## 2016-12-11 DIAGNOSIS — I4819 Other persistent atrial fibrillation: Secondary | ICD-10-CM

## 2016-12-11 DIAGNOSIS — I4891 Unspecified atrial fibrillation: Secondary | ICD-10-CM | POA: Diagnosis not present

## 2016-12-11 DIAGNOSIS — I481 Persistent atrial fibrillation: Secondary | ICD-10-CM

## 2016-12-11 LAB — PROTIME-INR: INR: 1.8 — AB (ref 0.9–1.1)

## 2016-12-24 DIAGNOSIS — Z85828 Personal history of other malignant neoplasm of skin: Secondary | ICD-10-CM | POA: Diagnosis not present

## 2016-12-24 DIAGNOSIS — Z85820 Personal history of malignant melanoma of skin: Secondary | ICD-10-CM | POA: Diagnosis not present

## 2016-12-24 DIAGNOSIS — C44311 Basal cell carcinoma of skin of nose: Secondary | ICD-10-CM | POA: Diagnosis not present

## 2016-12-25 ENCOUNTER — Ambulatory Visit (INDEPENDENT_AMBULATORY_CARE_PROVIDER_SITE_OTHER): Payer: Medicare Other | Admitting: Pharmacist

## 2016-12-25 DIAGNOSIS — I4891 Unspecified atrial fibrillation: Secondary | ICD-10-CM | POA: Diagnosis not present

## 2016-12-25 DIAGNOSIS — I4819 Other persistent atrial fibrillation: Secondary | ICD-10-CM

## 2016-12-25 DIAGNOSIS — Z7901 Long term (current) use of anticoagulants: Secondary | ICD-10-CM | POA: Diagnosis not present

## 2016-12-25 DIAGNOSIS — I481 Persistent atrial fibrillation: Secondary | ICD-10-CM

## 2016-12-25 LAB — PROTIME-INR: INR: 2.5 — AB (ref 0.9–1.1)

## 2016-12-31 DIAGNOSIS — C44729 Squamous cell carcinoma of skin of left lower limb, including hip: Secondary | ICD-10-CM | POA: Diagnosis not present

## 2016-12-31 DIAGNOSIS — Z85820 Personal history of malignant melanoma of skin: Secondary | ICD-10-CM | POA: Diagnosis not present

## 2016-12-31 DIAGNOSIS — C44712 Basal cell carcinoma of skin of right lower limb, including hip: Secondary | ICD-10-CM | POA: Diagnosis not present

## 2016-12-31 DIAGNOSIS — Z85828 Personal history of other malignant neoplasm of skin: Secondary | ICD-10-CM | POA: Diagnosis not present

## 2017-01-08 ENCOUNTER — Ambulatory Visit (INDEPENDENT_AMBULATORY_CARE_PROVIDER_SITE_OTHER): Payer: Medicare Other | Admitting: Internal Medicine

## 2017-01-08 DIAGNOSIS — Z5181 Encounter for therapeutic drug level monitoring: Secondary | ICD-10-CM | POA: Diagnosis not present

## 2017-01-08 DIAGNOSIS — Z7901 Long term (current) use of anticoagulants: Secondary | ICD-10-CM | POA: Diagnosis not present

## 2017-01-08 DIAGNOSIS — I4891 Unspecified atrial fibrillation: Secondary | ICD-10-CM | POA: Diagnosis not present

## 2017-01-08 DIAGNOSIS — I481 Persistent atrial fibrillation: Secondary | ICD-10-CM

## 2017-01-08 DIAGNOSIS — I4819 Other persistent atrial fibrillation: Secondary | ICD-10-CM

## 2017-01-08 LAB — PROTIME-INR: INR: 2.6 — AB (ref 0.9–1.1)

## 2017-01-09 ENCOUNTER — Other Ambulatory Visit: Payer: Self-pay | Admitting: Neurology

## 2017-02-05 ENCOUNTER — Ambulatory Visit (INDEPENDENT_AMBULATORY_CARE_PROVIDER_SITE_OTHER): Payer: Medicare Other | Admitting: Pharmacist

## 2017-02-05 DIAGNOSIS — I4891 Unspecified atrial fibrillation: Secondary | ICD-10-CM | POA: Diagnosis not present

## 2017-02-05 DIAGNOSIS — Z7901 Long term (current) use of anticoagulants: Secondary | ICD-10-CM | POA: Diagnosis not present

## 2017-02-05 DIAGNOSIS — I4819 Other persistent atrial fibrillation: Secondary | ICD-10-CM

## 2017-02-05 DIAGNOSIS — I481 Persistent atrial fibrillation: Secondary | ICD-10-CM

## 2017-02-05 LAB — POCT INR: INR: 3.4

## 2017-02-06 ENCOUNTER — Other Ambulatory Visit: Payer: Self-pay | Admitting: Interventional Cardiology

## 2017-02-19 ENCOUNTER — Ambulatory Visit (INDEPENDENT_AMBULATORY_CARE_PROVIDER_SITE_OTHER): Payer: Medicare Other | Admitting: Cardiovascular Disease

## 2017-02-19 DIAGNOSIS — I481 Persistent atrial fibrillation: Secondary | ICD-10-CM

## 2017-02-19 DIAGNOSIS — I4891 Unspecified atrial fibrillation: Secondary | ICD-10-CM | POA: Diagnosis not present

## 2017-02-19 DIAGNOSIS — Z5181 Encounter for therapeutic drug level monitoring: Secondary | ICD-10-CM | POA: Diagnosis not present

## 2017-02-19 DIAGNOSIS — I4819 Other persistent atrial fibrillation: Secondary | ICD-10-CM

## 2017-02-19 LAB — PROTIME-INR: INR: 2.8 — AB (ref 0.9–1.1)

## 2017-02-27 ENCOUNTER — Encounter: Payer: Self-pay | Admitting: Pharmacist

## 2017-02-27 DIAGNOSIS — L570 Actinic keratosis: Secondary | ICD-10-CM | POA: Diagnosis not present

## 2017-02-27 DIAGNOSIS — L821 Other seborrheic keratosis: Secondary | ICD-10-CM | POA: Diagnosis not present

## 2017-02-27 DIAGNOSIS — D2271 Melanocytic nevi of right lower limb, including hip: Secondary | ICD-10-CM | POA: Diagnosis not present

## 2017-02-27 DIAGNOSIS — D1801 Hemangioma of skin and subcutaneous tissue: Secondary | ICD-10-CM | POA: Diagnosis not present

## 2017-02-27 DIAGNOSIS — D485 Neoplasm of uncertain behavior of skin: Secondary | ICD-10-CM | POA: Diagnosis not present

## 2017-02-27 DIAGNOSIS — Z85828 Personal history of other malignant neoplasm of skin: Secondary | ICD-10-CM | POA: Diagnosis not present

## 2017-02-27 DIAGNOSIS — D2262 Melanocytic nevi of left upper limb, including shoulder: Secondary | ICD-10-CM | POA: Diagnosis not present

## 2017-02-27 DIAGNOSIS — D225 Melanocytic nevi of trunk: Secondary | ICD-10-CM | POA: Diagnosis not present

## 2017-02-27 DIAGNOSIS — D2261 Melanocytic nevi of right upper limb, including shoulder: Secondary | ICD-10-CM | POA: Diagnosis not present

## 2017-02-27 DIAGNOSIS — Z85820 Personal history of malignant melanoma of skin: Secondary | ICD-10-CM | POA: Diagnosis not present

## 2017-03-16 DIAGNOSIS — N3021 Other chronic cystitis with hematuria: Secondary | ICD-10-CM | POA: Diagnosis not present

## 2017-03-16 DIAGNOSIS — R3914 Feeling of incomplete bladder emptying: Secondary | ICD-10-CM | POA: Diagnosis not present

## 2017-03-19 ENCOUNTER — Ambulatory Visit (INDEPENDENT_AMBULATORY_CARE_PROVIDER_SITE_OTHER): Payer: Medicare Other | Admitting: Internal Medicine

## 2017-03-19 DIAGNOSIS — Z5181 Encounter for therapeutic drug level monitoring: Secondary | ICD-10-CM

## 2017-03-19 DIAGNOSIS — I4891 Unspecified atrial fibrillation: Secondary | ICD-10-CM

## 2017-03-19 DIAGNOSIS — I481 Persistent atrial fibrillation: Secondary | ICD-10-CM | POA: Diagnosis not present

## 2017-03-19 DIAGNOSIS — I4819 Other persistent atrial fibrillation: Secondary | ICD-10-CM

## 2017-03-19 LAB — PROTIME-INR: INR: 3.3 — AB (ref 0.9–1.1)

## 2017-03-24 ENCOUNTER — Other Ambulatory Visit (HOSPITAL_BASED_OUTPATIENT_CLINIC_OR_DEPARTMENT_OTHER): Payer: Medicare Other

## 2017-03-24 ENCOUNTER — Telehealth: Payer: Self-pay | Admitting: Oncology

## 2017-03-24 ENCOUNTER — Ambulatory Visit (HOSPITAL_BASED_OUTPATIENT_CLINIC_OR_DEPARTMENT_OTHER): Payer: Medicare Other | Admitting: Oncology

## 2017-03-24 VITALS — BP 142/66 | HR 65 | Temp 97.9°F | Resp 18 | Ht 79.0 in | Wt 285.5 lb

## 2017-03-24 DIAGNOSIS — D72819 Decreased white blood cell count, unspecified: Secondary | ICD-10-CM | POA: Diagnosis not present

## 2017-03-24 DIAGNOSIS — D696 Thrombocytopenia, unspecified: Secondary | ICD-10-CM

## 2017-03-24 LAB — CBC WITH DIFFERENTIAL/PLATELET
BASO%: 0.3 % (ref 0.0–2.0)
Basophils Absolute: 0 10*3/uL (ref 0.0–0.1)
EOS%: 0.8 % (ref 0.0–7.0)
Eosinophils Absolute: 0 10*3/uL (ref 0.0–0.5)
HEMATOCRIT: 44.9 % (ref 38.4–49.9)
HGB: 15.3 g/dL (ref 13.0–17.1)
LYMPH#: 0.8 10*3/uL — AB (ref 0.9–3.3)
LYMPH%: 31.3 % (ref 14.0–49.0)
MCH: 31.5 pg (ref 27.2–33.4)
MCHC: 34.1 g/dL (ref 32.0–36.0)
MCV: 92.4 fL (ref 79.3–98.0)
MONO#: 0.3 10*3/uL (ref 0.1–0.9)
MONO%: 10.1 % (ref 0.0–14.0)
NEUT#: 1.5 10*3/uL (ref 1.5–6.5)
NEUT%: 57.5 % (ref 39.0–75.0)
Platelets: 89 10*3/uL — ABNORMAL LOW (ref 140–400)
RBC: 4.85 10*6/uL (ref 4.20–5.82)
RDW: 14.3 % (ref 11.0–14.6)
WBC: 2.6 10*3/uL — AB (ref 4.0–10.3)

## 2017-03-24 NOTE — Progress Notes (Signed)
Hematology and Oncology Follow Up Visit  SUTTER AHLGREN [id number] 1932/05/01 83 y.o. 03/24/2017 8:38 AM [name], [place], [name], MD   Principle Diagnosis: 83 year old gentleman with thrombocytopenia diagnosed in September 2017.  He presented with a platelet count of 61. 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 Other causes also a consideration.   Current therapy: Observation and surveillance.  Interim History:  Mr. Bernhard is as today for a follow-up visit. Since the last visit, he was hospitalized in April 2018 for UTI sepsis. During his hospitalization, his platelet count drifted to as low as 52 but recovered to his baseline function. Since his discharge, he reports no issues at that time. He denied any recent fevers, chills or decline in his performance status. He remains ambulatory with the help of a walker and didn't have any falls or syncope.   He denied any bleeding complications including epistaxis, hematochezia or melena. He remains on warfarin without any changes. He continues to have 2 alcoholic drinks which she has taken for many years. He denied any constitutional symptoms of weight loss or appetite changes. He denied any excessive fatigue or tiredness.  He does not report any headaches, blurry vision, syncope or seizures. He does not report any fevers, chills, sweats or weight loss. He does not report any chest pain, palpitation, orthopnea or leg edema. He does not report any cough, wheezing or hemoptysis. He does not report any nausea, vomiting or abdominal pain. He does not report any constipation or diarrhea. He does not report any frequency urgency or hesitancy. Remaining review of systems unremarkable.    Medications: I have reviewed the patient's current medications.  Current Outpatient Prescriptions  Medication Sig Dispense Refill  . acetaminophen (TYLENOL) 500 MG tablet Take 500 mg by mouth every morning. Document area of  discomfort and effectiveness of medication.     Marland Kitchen atorvastatin (LIPITOR) 20 MG tablet Take 20 mg by mouth every evening.     . donepezil (ARICEPT) 10 MG tablet TAKE 1 TABLET ONCE DAILY. 90 tablet 3  . folic acid (FOLVITE) 1 MG tablet Take 1 tablet (1 mg total) by mouth daily.    Marland Kitchen gabapentin (NEURONTIN) 300 MG capsule Take 1 capsule (300 mg total) by mouth at bedtime. 90 capsule 6  . hydrochlorothiazide (MICROZIDE) 12.5 MG capsule Take 12.5 mg by mouth every other day.    . memantine (NAMENDA) 5 MG tablet Take one pill in the morning for 2 weeks and then increase to 1 pill two times a day 60 tablet 12  . methenamine (MANDELAMINE) 1 g tablet Take 1,000 mg by mouth 2 (two) times daily.    . Tamsulosin HCl (FLOMAX) 0.4 MG CAPS Take 0.8 mg by mouth daily after supper.     . thiamine 100 MG tablet Take 1 tablet (100 mg total) by mouth daily.    . vitamin C (ASCORBIC ACID) 500 MG tablet Take 500 mg by mouth 2 (two) times daily.    Marland Kitchen warfarin (COUMADIN) 7.5 MG tablet TAKE 1 TABLET DAILY OR AS DIRECTED. 30 tablet 3   No current facility-administered medications for this visit.      Allergies:  Allergies  Allergen Reactions  . Penicillins Rash    Has patient had a PCN reaction causing immediate rash, facial/tongue/throat swelling, SOB or lightheadedness with hypotension: unknown Has patient had a PCN reaction causing severe rash involving mucus membranes or skin necrosis: unknown Has patient had a PCN reaction that required hospitalization : unknown Has  patient had a PCN reaction occurring within the last 10 years: no If all of the above answers are "NO", then may proceed with Cephalosporin use.     Past Medical History, Surgical history, Social history, and Family History were reviewed and updated.   Physical Exam: Blood pressure (!) 142/66, pulse 65, temperature 97.9 F (36.6 C), temperature source Oral, resp. rate 18, height '6\' 7"'  (2.007 m), weight 285 lb 8 oz (129.5 kg), SpO2 96  %. ECOG: 1 General appearance: Well-appearing gentleman appeared without distress. Head: Normocephalic, without obvious abnormality no scleral icterus. Neck: no adenopathy no neck masses or lesions. Lymph nodes: Cervical, supraclavicular, and axillary nodes normal. Heart:regular rate and rhythm, S1, S2 normal, no murmur, click, rub or gallop Lung:chest clear, no wheezing, rales, normal symmetric air entry Abdomin: soft, non-tender, without masses or organomegaly EXT:no erythema, induration, or nodules Skin showed no rashes or petechiae.  Lab Results: Lab Results  Component Value Date   WBC 2.6 (L) 03/24/2017   HGB 15.3 03/24/2017   HCT 44.9 03/24/2017   MCV 92.4 03/24/2017   PLT 89 (L) 03/24/2017     Chemistry      Component Value Date/Time   NA 145 10/13/2016 0300   NA 144 09/17/2016 0742   K 3.9 10/13/2016 0300   K 4.1 09/17/2016 0742   CL 113 (H) 10/10/2016 0620   CO2 25 10/10/2016 0620   CO2 30 (H) 09/17/2016 0742   BUN 17 10/13/2016 0300   BUN 18.1 09/17/2016 0742   CREATININE 1.1 10/13/2016 0300   CREATININE 1.56 (H) 10/10/2016 0620   CREATININE 1.2 09/17/2016 0742   GLU 111 10/13/2016 0300      Component Value Date/Time   CALCIUM 9.0 10/10/2016 0620   CALCIUM 10.4 09/17/2016 0742   ALKPHOS 57 10/07/2016 1405   ALKPHOS 77 09/17/2016 0742   AST 35 10/07/2016 1405   AST 22 09/17/2016 0742   ALT 22 10/07/2016 1405   ALT 21 09/17/2016 0742   BILITOT 2.8 (H) 10/07/2016 1405   BILITOT 1.38 (H) 09/17/2016 0742         Impression and Plan:  83 year old gentleman with the following issues:  1. Thrombocytopenia: Mild and fluctuating since 2017. His platelet counts have ranged as well as 61,000 and today they are 91,000. The differential diagnosis includes alcohol consumption, ITP, liver disease or early myelodysplastic syndrome.  Given his history of chronic drinking, I will obtain ultrasound of the abdomen to rule out splenomegaly and portal  hypertension.  I plan to repeat his counts and 6 months and consider bone marrow biopsy if he develops worsening cytopenias.  2. Leukocytopenia with a normal differential: This could be related to liver disease and alcohol consumption versus early myelodysplastic syndrome. He understands that a bone marrow biopsy could be a possibility in the future if she develops any further cytopenias.  Bone marrow biopsy will be deferred for the time being given the mild abnormalities in his counts. At this point, he is not a candidate for any therapy and a bone marrow biopsy will not alter our approach of conservative management.  3. Follow-up: Will be in 6 months sooner if needed to.    Zola Button, MD 9/18/20188:38 AM

## 2017-03-24 NOTE — Telephone Encounter (Signed)
Gave avs and calendar for march 2019 

## 2017-03-30 ENCOUNTER — Ambulatory Visit (HOSPITAL_COMMUNITY)
Admission: RE | Admit: 2017-03-30 | Discharge: 2017-03-30 | Disposition: A | Discharge status: Home / Self Care | Payer: Medicare Other | Source: Ambulatory Visit | Attending: Oncology | Admitting: Oncology

## 2017-03-30 ENCOUNTER — Encounter: Payer: Self-pay | Admitting: *Deleted

## 2017-03-30 ENCOUNTER — Other Ambulatory Visit: Payer: Self-pay | Admitting: Oncology

## 2017-03-30 DIAGNOSIS — D696 Thrombocytopenia, unspecified: Secondary | ICD-10-CM

## 2017-03-30 DIAGNOSIS — K760 Fatty (change of) liver, not elsewhere classified: Secondary | ICD-10-CM | POA: Insufficient documentation

## 2017-03-30 DIAGNOSIS — N289 Disorder of kidney and ureter, unspecified: Secondary | ICD-10-CM | POA: Insufficient documentation

## 2017-03-30 DIAGNOSIS — K8020 Calculus of gallbladder without cholecystitis without obstruction: Secondary | ICD-10-CM | POA: Diagnosis not present

## 2017-03-31 ENCOUNTER — Telehealth: Payer: Self-pay | Admitting: *Deleted

## 2017-03-31 NOTE — Telephone Encounter (Signed)
Attempted to return call from wife, busy  X 4

## 2017-03-31 NOTE — Telephone Encounter (Signed)
Lm on answering machine to call desk nurse.

## 2017-04-09 ENCOUNTER — Ambulatory Visit (INDEPENDENT_AMBULATORY_CARE_PROVIDER_SITE_OTHER): Payer: Medicare Other | Admitting: Internal Medicine

## 2017-04-09 DIAGNOSIS — I4891 Unspecified atrial fibrillation: Secondary | ICD-10-CM

## 2017-04-09 DIAGNOSIS — Z5181 Encounter for therapeutic drug level monitoring: Secondary | ICD-10-CM | POA: Diagnosis not present

## 2017-04-09 LAB — PROTIME-INR: INR: 3 — AB (ref 0.9–1.1)

## 2017-04-30 ENCOUNTER — Ambulatory Visit (INDEPENDENT_AMBULATORY_CARE_PROVIDER_SITE_OTHER): Payer: Medicare Other | Admitting: Cardiovascular Disease

## 2017-04-30 DIAGNOSIS — Z5181 Encounter for therapeutic drug level monitoring: Secondary | ICD-10-CM

## 2017-04-30 DIAGNOSIS — I4891 Unspecified atrial fibrillation: Secondary | ICD-10-CM

## 2017-04-30 DIAGNOSIS — Z23 Encounter for immunization: Secondary | ICD-10-CM | POA: Diagnosis not present

## 2017-04-30 DIAGNOSIS — Z7901 Long term (current) use of anticoagulants: Secondary | ICD-10-CM | POA: Diagnosis not present

## 2017-04-30 LAB — PROTIME-INR: INR: 2.8 — AB (ref 0.9–1.1)

## 2017-05-12 ENCOUNTER — Telehealth: Payer: Self-pay | Admitting: *Deleted

## 2017-05-12 DIAGNOSIS — R05 Cough: Secondary | ICD-10-CM | POA: Diagnosis not present

## 2017-05-12 DIAGNOSIS — J189 Pneumonia, unspecified organism: Secondary | ICD-10-CM | POA: Diagnosis not present

## 2017-05-12 DIAGNOSIS — R062 Wheezing: Secondary | ICD-10-CM | POA: Diagnosis not present

## 2017-05-12 DIAGNOSIS — Z6831 Body mass index (BMI) 31.0-31.9, adult: Secondary | ICD-10-CM | POA: Diagnosis not present

## 2017-05-12 NOTE — Telephone Encounter (Signed)
Received med alert from [facility] that pt is taking Levaquin and Warfarin a potential interaction. Thus, telephoned pt and spoke with his spouse and she stated he started Levaquin 500mg  QD on 05/08/17 and today it was increased to 750mg  QD for 7 days and also started today Prednisone 20mg  QD for 3 days. Educated spouse that when pt gets any medication changes or new meds to call as some meds can affect his INR as these two do, thus he needs an INR asap. Telephoned clinic at [facility] and spoke with Maudie Mercury, explained the above, she states an INR can be done tomorrow if order is sent. Thus, Dr Tamala Julian signed order for INR check tomorrow and results to be sent to [place]. Pt added to lab book for tomorrow.

## 2017-05-13 ENCOUNTER — Ambulatory Visit (INDEPENDENT_AMBULATORY_CARE_PROVIDER_SITE_OTHER): Payer: Medicare Other

## 2017-05-13 DIAGNOSIS — Z5181 Encounter for therapeutic drug level monitoring: Secondary | ICD-10-CM

## 2017-05-13 DIAGNOSIS — I4891 Unspecified atrial fibrillation: Secondary | ICD-10-CM

## 2017-05-13 LAB — PROTIME-INR: INR: 2.8 — AB (ref 0.9–1.1)

## 2017-05-15 ENCOUNTER — Ambulatory Visit (INDEPENDENT_AMBULATORY_CARE_PROVIDER_SITE_OTHER): Payer: Medicare Other | Admitting: Pharmacist Clinician (PhC)/ Clinical Pharmacy Specialist

## 2017-05-15 DIAGNOSIS — I4891 Unspecified atrial fibrillation: Secondary | ICD-10-CM | POA: Diagnosis not present

## 2017-05-15 DIAGNOSIS — Z5181 Encounter for therapeutic drug level monitoring: Secondary | ICD-10-CM | POA: Diagnosis not present

## 2017-05-15 LAB — PROTIME-INR: INR: 4.4 — AB (ref 0.9–1.1)

## 2017-05-18 ENCOUNTER — Encounter: Payer: Self-pay | Admitting: Interventional Cardiology

## 2017-05-18 DIAGNOSIS — Z125 Encounter for screening for malignant neoplasm of prostate: Secondary | ICD-10-CM | POA: Diagnosis not present

## 2017-05-18 DIAGNOSIS — R7309 Other abnormal glucose: Secondary | ICD-10-CM | POA: Diagnosis not present

## 2017-05-18 DIAGNOSIS — R82998 Other abnormal findings in urine: Secondary | ICD-10-CM | POA: Diagnosis not present

## 2017-05-18 DIAGNOSIS — E7849 Other hyperlipidemia: Secondary | ICD-10-CM | POA: Diagnosis not present

## 2017-05-22 DIAGNOSIS — C641 Malignant neoplasm of right kidney, except renal pelvis: Secondary | ICD-10-CM | POA: Diagnosis not present

## 2017-05-25 ENCOUNTER — Telehealth: Payer: Self-pay | Admitting: Interventional Cardiology

## 2017-05-25 ENCOUNTER — Ambulatory Visit (INDEPENDENT_AMBULATORY_CARE_PROVIDER_SITE_OTHER): Payer: Medicare Other | Admitting: Internal Medicine

## 2017-05-25 DIAGNOSIS — I2721 Secondary pulmonary arterial hypertension: Secondary | ICD-10-CM | POA: Diagnosis not present

## 2017-05-25 DIAGNOSIS — I6990 Unspecified sequelae of unspecified cerebrovascular disease: Secondary | ICD-10-CM | POA: Diagnosis not present

## 2017-05-25 DIAGNOSIS — Z6831 Body mass index (BMI) 31.0-31.9, adult: Secondary | ICD-10-CM | POA: Diagnosis not present

## 2017-05-25 DIAGNOSIS — I480 Paroxysmal atrial fibrillation: Secondary | ICD-10-CM | POA: Diagnosis not present

## 2017-05-25 DIAGNOSIS — J189 Pneumonia, unspecified organism: Secondary | ICD-10-CM | POA: Diagnosis not present

## 2017-05-25 DIAGNOSIS — Z0000 Encounter for general adult medical examination without abnormal findings: Secondary | ICD-10-CM | POA: Diagnosis not present

## 2017-05-25 DIAGNOSIS — Z1389 Encounter for screening for other disorder: Secondary | ICD-10-CM | POA: Diagnosis not present

## 2017-05-25 DIAGNOSIS — I4891 Unspecified atrial fibrillation: Secondary | ICD-10-CM | POA: Diagnosis not present

## 2017-05-25 DIAGNOSIS — R627 Adult failure to thrive: Secondary | ICD-10-CM | POA: Diagnosis not present

## 2017-05-25 DIAGNOSIS — Z7901 Long term (current) use of anticoagulants: Secondary | ICD-10-CM | POA: Diagnosis not present

## 2017-05-25 DIAGNOSIS — Z5181 Encounter for therapeutic drug level monitoring: Secondary | ICD-10-CM

## 2017-05-25 DIAGNOSIS — G3184 Mild cognitive impairment, so stated: Secondary | ICD-10-CM | POA: Diagnosis not present

## 2017-05-25 DIAGNOSIS — G6289 Other specified polyneuropathies: Secondary | ICD-10-CM | POA: Diagnosis not present

## 2017-05-25 DIAGNOSIS — I7389 Other specified peripheral vascular diseases: Secondary | ICD-10-CM | POA: Diagnosis not present

## 2017-05-25 LAB — PROTIME-INR: INR: 3.1 — AB (ref <=1.1)

## 2017-05-25 NOTE — Telephone Encounter (Signed)
New message    Patient spouse calling to report INR. INR from 11/19 taken at PCP office INR 3.1   If [facility] is calling please get Coumadin Nurse on the phone STAT  1.  Are you calling in regards to an appointment? no  2.  Are you calling for a refill ? no  3.  Are you having bleeding issues? no  4.  Do you need clearance to hold Coumadin? no   Please route to the Coumadin Clinic Pool

## 2017-05-25 NOTE — Telephone Encounter (Signed)
Called Dr. Keane Police office to inquire about the labs pt had done at Dr. Keane Police office today. She will fax over the MD note & labs to our Anticoagulation Clinic.

## 2017-05-26 DIAGNOSIS — K8020 Calculus of gallbladder without cholecystitis without obstruction: Secondary | ICD-10-CM | POA: Diagnosis not present

## 2017-05-26 DIAGNOSIS — C641 Malignant neoplasm of right kidney, except renal pelvis: Secondary | ICD-10-CM | POA: Diagnosis not present

## 2017-05-31 NOTE — Progress Notes (Signed)
Cardiology Office Note    Date:  06/01/2017   ID:  Peter Singh, DOB [birth year], MRN [id number]  PCP:  [name], MD  Cardiologist: Sinclair Grooms, MD   Chief Complaint  Patient presents with  . Atrial Fibrillation    History of Present Illness:  Peter Singh is a 83 y.o. male who presents for chronic atrial fibrillation, chronic anticoagulation therapy, essential hypertension, chronic stable diastolic heart failure.   He is doing well.  He has not had syncope.  Denies orthopnea, PND, lower extremity swelling, and neurological complaints.  No blood in the urine or stool.  No medication side effects.   Past Medical History:  Diagnosis Date  . Atrial fibrillation ([place])   . Diverticulosis of colon (without mention of hemorrhage)   . Hyperlipemia   . Hypertension   . Melanoma ([place])   . Personal history of colonic polyps 1999 & 2004   adenomatous polyps  . Ventricular hypertrophy     Past Surgical History:  Procedure Laterality Date  . KNEE SURGERY     right  . PILONIDAL CYST DRAINAGE    . schwanoma tumor lumbar spine    . SPINE SURGERY     tumor removed  . TONSILLECTOMY AND ADENOIDECTOMY      Current Medications: Outpatient Medications Prior to Visit  Medication Sig Dispense Refill  . acetaminophen (TYLENOL) 500 MG tablet Take 500 mg by mouth every morning. Document area of discomfort and effectiveness of medication.     Marland Kitchen atorvastatin (LIPITOR) 20 MG tablet Take 20 mg by mouth every evening.     . donepezil (ARICEPT) 10 MG tablet TAKE 1 TABLET ONCE DAILY. 90 tablet 3  . folic acid (FOLVITE) 1 MG tablet Take 1 tablet (1 mg total) by mouth daily.    Marland Kitchen gabapentin (NEURONTIN) 300 MG capsule Take 300 mg by mouth at bedtime as needed (pain).    . hydrochlorothiazide (MICROZIDE) 12.5 MG capsule Take 12.5 mg by mouth every other day.    . memantine (NAMENDA) 5 MG tablet Take one pill in the morning for 2 weeks and then increase to 1 pill two times a day 60  tablet 12  . methenamine (MANDELAMINE) 1 g tablet Take 1,000 mg by mouth 2 (two) times daily.    . Tamsulosin HCl (FLOMAX) 0.4 MG CAPS Take 0.8 mg by mouth daily after supper.     . thiamine 100 MG tablet Take 1 tablet (100 mg total) by mouth daily.    . vitamin C (ASCORBIC ACID) 500 MG tablet Take 500 mg by mouth 2 (two) times daily.    Marland Kitchen warfarin (COUMADIN) 7.5 MG tablet TAKE 1 TABLET DAILY OR AS DIRECTED. 30 tablet 3  . gabapentin (NEURONTIN) 300 MG capsule Take 1 capsule (300 mg total) by mouth at bedtime. (Patient not taking: Reported on 06/01/2017) 90 capsule 6   No facility-administered medications prior to visit.      Allergies:   Penicillins   Social History   Socioeconomic History  . Marital status: Married    Spouse name: None  . Number of children: 2  . Years of education: None  . Highest education level: None  Social Needs  . Financial resource strain: None  . Food insecurity - worry: None  . Food insecurity - inability: None  . Transportation needs - medical: None  . Transportation needs - non-medical: None  Occupational History  . Occupation: retired  Tobacco Use  . Smoking status: Former  Smoker    Packs/day: 3.00    Types: Cigarettes    Last attempt to quit: 06/09/1975    Years since quitting: 42.0  . Smokeless tobacco: Never Used  Substance and Sexual Activity  . Alcohol use: Yes    Alcohol/week: 0.0 oz    Comment: 2 drinks per day  . Drug use: No  . Sexual activity: None  Other Topics Concern  . None  Social History Narrative   Patient is married with 2 children    Patient is retired    Patient lives at [place] History:  The patient's family history includes Stroke in his father.   ROS:   Please see the history of present illness.    Difficulty urinating.  Recently developed pneumonia and has been treated as an outpatient by Dr. Virgina Jock. All other systems reviewed and are negative.   PHYSICAL EXAM:   VS:  BP 126/70   Pulse 83    Ht 6\' 7"  (2.007 m)   Wt 271 lb 12.8 oz (123.3 kg)   BMI 30.62 kg/m    GEN: Well nourished, well developed, in no acute distress  HEENT: normal  Neck: no JVD, carotid bruits, or masses Cardiac: IIRR; no murmurs, rubs, or gallops,no edema  Respiratory:  clear to auscultation bilaterally, normal work of breathing GI: soft, nontender, nondistended, + BS MS: no deformity or atrophy  Skin: warm and dry, no rash Neuro:  Alert and Oriented x 3, Strength and sensation are intact Psych: euthymic mood, full affect  Wt Readings from Last 3 Encounters:  06/01/17 271 lb 12.8 oz (123.3 kg)  03/24/17 285 lb 8 oz (129.5 kg)  11/05/16 243 lb (110.2 kg)      Studies/Labs Reviewed:   EKG:  EKG performed in April revealed atrial fibrillation with poor R wave progression.  Rate was well controlled.  Recent Labs: 10/07/2016: ALT 22 10/13/2016: BUN 17; Creatinine 1.1; Potassium 3.9; Sodium 145 03/24/2017: HGB 15.3; Platelets 89   Lipid Panel No results found for: CHOL, TRIG, HDL, CHOLHDL, VLDL, LDLCALC, LDLDIRECT  Additional studies/ records that were reviewed today include:  nONE    ASSESSMENT:    1. Atrial fibrillation, unspecified type ([place])   2. Essential hypertension   3. Chronic anticoagulation      PLAN:  In order of problems listed above:  1. Chronic atrial fibrillation with good rate control 2. Excellent blood pressure control less than 140/90 mmHg. 3. INR has been therapeutic in the 2-2.5 range in Coumadin clinic.  Cardiac status is stable.  No excessive bradycardia related to atrial fibrillation.  Overall doing well but having failing health and other areas including recent pneumonia.    Medication Adjustments/Labs and Tests Ordered: Current medicines are reviewed at length with the patient today.  Concerns regarding medicines are outlined above.  Medication changes, Labs and Tests ordered today are listed in the Patient Instructions below. There are no Patient  Instructions on file for this visit.   Signed, Sinclair Grooms, MD  06/01/2017 3:25 PM    [facility] [address], [place], [place]  [address] Phone: [phone]; Fax: [phone]

## 2017-06-01 ENCOUNTER — Encounter: Payer: Self-pay | Admitting: Interventional Cardiology

## 2017-06-01 ENCOUNTER — Ambulatory Visit (INDEPENDENT_AMBULATORY_CARE_PROVIDER_SITE_OTHER): Payer: Medicare Other | Admitting: Interventional Cardiology

## 2017-06-01 ENCOUNTER — Encounter (INDEPENDENT_AMBULATORY_CARE_PROVIDER_SITE_OTHER): Payer: Self-pay

## 2017-06-01 VITALS — BP 126/70 | HR 83 | Ht 79.0 in | Wt 271.8 lb

## 2017-06-01 DIAGNOSIS — I10 Essential (primary) hypertension: Secondary | ICD-10-CM | POA: Diagnosis not present

## 2017-06-01 DIAGNOSIS — I4891 Unspecified atrial fibrillation: Secondary | ICD-10-CM

## 2017-06-01 DIAGNOSIS — Z85820 Personal history of malignant melanoma of skin: Secondary | ICD-10-CM | POA: Diagnosis not present

## 2017-06-01 DIAGNOSIS — L821 Other seborrheic keratosis: Secondary | ICD-10-CM | POA: Diagnosis not present

## 2017-06-01 DIAGNOSIS — L720 Epidermal cyst: Secondary | ICD-10-CM | POA: Diagnosis not present

## 2017-06-01 DIAGNOSIS — D1801 Hemangioma of skin and subcutaneous tissue: Secondary | ICD-10-CM | POA: Diagnosis not present

## 2017-06-01 DIAGNOSIS — L570 Actinic keratosis: Secondary | ICD-10-CM | POA: Diagnosis not present

## 2017-06-01 DIAGNOSIS — L814 Other melanin hyperpigmentation: Secondary | ICD-10-CM | POA: Diagnosis not present

## 2017-06-01 DIAGNOSIS — L820 Inflamed seborrheic keratosis: Secondary | ICD-10-CM | POA: Diagnosis not present

## 2017-06-01 DIAGNOSIS — Z85828 Personal history of other malignant neoplasm of skin: Secondary | ICD-10-CM | POA: Diagnosis not present

## 2017-06-01 DIAGNOSIS — D485 Neoplasm of uncertain behavior of skin: Secondary | ICD-10-CM | POA: Diagnosis not present

## 2017-06-01 DIAGNOSIS — Z7901 Long term (current) use of anticoagulants: Secondary | ICD-10-CM

## 2017-06-01 NOTE — Patient Instructions (Signed)

## 2017-06-02 DIAGNOSIS — Z1212 Encounter for screening for malignant neoplasm of rectum: Secondary | ICD-10-CM | POA: Diagnosis not present

## 2017-06-02 LAB — IFOBT (OCCULT BLOOD): IFOBT: NEGATIVE

## 2017-06-04 ENCOUNTER — Ambulatory Visit (INDEPENDENT_AMBULATORY_CARE_PROVIDER_SITE_OTHER): Payer: Medicare Other | Admitting: Cardiovascular Disease

## 2017-06-04 DIAGNOSIS — Z5181 Encounter for therapeutic drug level monitoring: Secondary | ICD-10-CM | POA: Diagnosis not present

## 2017-06-04 DIAGNOSIS — I4891 Unspecified atrial fibrillation: Secondary | ICD-10-CM | POA: Diagnosis not present

## 2017-06-04 LAB — PROTIME-INR: INR: 3.4 — AB (ref 0.9–1.1)

## 2017-06-12 DIAGNOSIS — N401 Enlarged prostate with lower urinary tract symptoms: Secondary | ICD-10-CM | POA: Diagnosis not present

## 2017-06-12 DIAGNOSIS — C641 Malignant neoplasm of right kidney, except renal pelvis: Secondary | ICD-10-CM | POA: Diagnosis not present

## 2017-06-12 DIAGNOSIS — R3914 Feeling of incomplete bladder emptying: Secondary | ICD-10-CM | POA: Diagnosis not present

## 2017-06-16 DIAGNOSIS — I4891 Unspecified atrial fibrillation: Secondary | ICD-10-CM | POA: Diagnosis not present

## 2017-06-16 DIAGNOSIS — I482 Chronic atrial fibrillation: Secondary | ICD-10-CM | POA: Diagnosis not present

## 2017-06-16 LAB — POCT INR: INR: 2.4

## 2017-06-17 ENCOUNTER — Ambulatory Visit (INDEPENDENT_AMBULATORY_CARE_PROVIDER_SITE_OTHER): Payer: Medicare Other | Admitting: Pharmacist

## 2017-06-17 DIAGNOSIS — I4891 Unspecified atrial fibrillation: Secondary | ICD-10-CM | POA: Diagnosis not present

## 2017-06-17 DIAGNOSIS — Z5181 Encounter for therapeutic drug level monitoring: Secondary | ICD-10-CM

## 2017-06-17 NOTE — Patient Instructions (Signed)
Spoke with pt's wife, advised to have pt continue same dose 1 tablet daily except 1/2 tablet on Mondays, Wednesdays and Fridays.  Order faxed back to [facility] to check INR in 3 weeks and fax results to [facility] at [phone]. Order faxed to [facility] [phone].

## 2017-07-09 ENCOUNTER — Ambulatory Visit (INDEPENDENT_AMBULATORY_CARE_PROVIDER_SITE_OTHER): Payer: Medicare Other | Admitting: Cardiovascular Disease

## 2017-07-09 ENCOUNTER — Other Ambulatory Visit: Payer: Self-pay | Admitting: Interventional Cardiology

## 2017-07-09 DIAGNOSIS — Z5181 Encounter for therapeutic drug level monitoring: Secondary | ICD-10-CM

## 2017-07-09 DIAGNOSIS — I4891 Unspecified atrial fibrillation: Secondary | ICD-10-CM

## 2017-07-09 LAB — PROTIME-INR: INR: 3.3 — AB (ref 0.9–1.1)

## 2017-07-09 NOTE — Telephone Encounter (Signed)
INR due on 07/07/17, rescheduled per [facility] to 07/09/17 due to holiday, pt had drawn today will await results.

## 2017-07-23 ENCOUNTER — Ambulatory Visit (INDEPENDENT_AMBULATORY_CARE_PROVIDER_SITE_OTHER): Payer: Medicare Other | Admitting: Internal Medicine

## 2017-07-23 DIAGNOSIS — Z5181 Encounter for therapeutic drug level monitoring: Secondary | ICD-10-CM | POA: Diagnosis not present

## 2017-07-23 DIAGNOSIS — Z7901 Long term (current) use of anticoagulants: Secondary | ICD-10-CM | POA: Diagnosis not present

## 2017-07-23 DIAGNOSIS — I4891 Unspecified atrial fibrillation: Secondary | ICD-10-CM

## 2017-07-23 LAB — PROTIME-INR: INR: 2.4 — AB (ref 0.9–1.1)

## 2017-07-23 NOTE — Patient Instructions (Signed)
Description   Spoke with pt's wife, advised to continue same dose 1 tablet daily except 1/2 tablet on Mondays, Wednesdays and Fridays.  Order faxed back to [facility] to check INR in 2 weeks and fax results to [facility] at [phone]. Order faxed to [facility] [phone].

## 2017-08-13 ENCOUNTER — Ambulatory Visit (INDEPENDENT_AMBULATORY_CARE_PROVIDER_SITE_OTHER): Payer: Medicare Other | Admitting: Pharmacist

## 2017-08-13 DIAGNOSIS — Z5181 Encounter for therapeutic drug level monitoring: Secondary | ICD-10-CM | POA: Diagnosis not present

## 2017-08-13 DIAGNOSIS — I4891 Unspecified atrial fibrillation: Secondary | ICD-10-CM | POA: Diagnosis not present

## 2017-08-13 LAB — PROTIME-INR: INR: 2.5 — AB (ref 0.9–1.1)

## 2017-09-10 ENCOUNTER — Ambulatory Visit (INDEPENDENT_AMBULATORY_CARE_PROVIDER_SITE_OTHER): Payer: Medicare Other | Admitting: Internal Medicine

## 2017-09-10 DIAGNOSIS — Z5181 Encounter for therapeutic drug level monitoring: Secondary | ICD-10-CM

## 2017-09-10 DIAGNOSIS — I4891 Unspecified atrial fibrillation: Secondary | ICD-10-CM | POA: Diagnosis not present

## 2017-09-10 LAB — PROTIME-INR: INR: 3.1 — AB (ref 0.9–1.1)

## 2017-09-12 IMAGING — CR DG CHEST 2V
2 series · 2 of 2 positions shown · non-contrast
Comparison: 03/11/2016

CLINICAL DATA: Fever, weakness

EXAM:
CHEST  2 VIEW

[w chest lat]
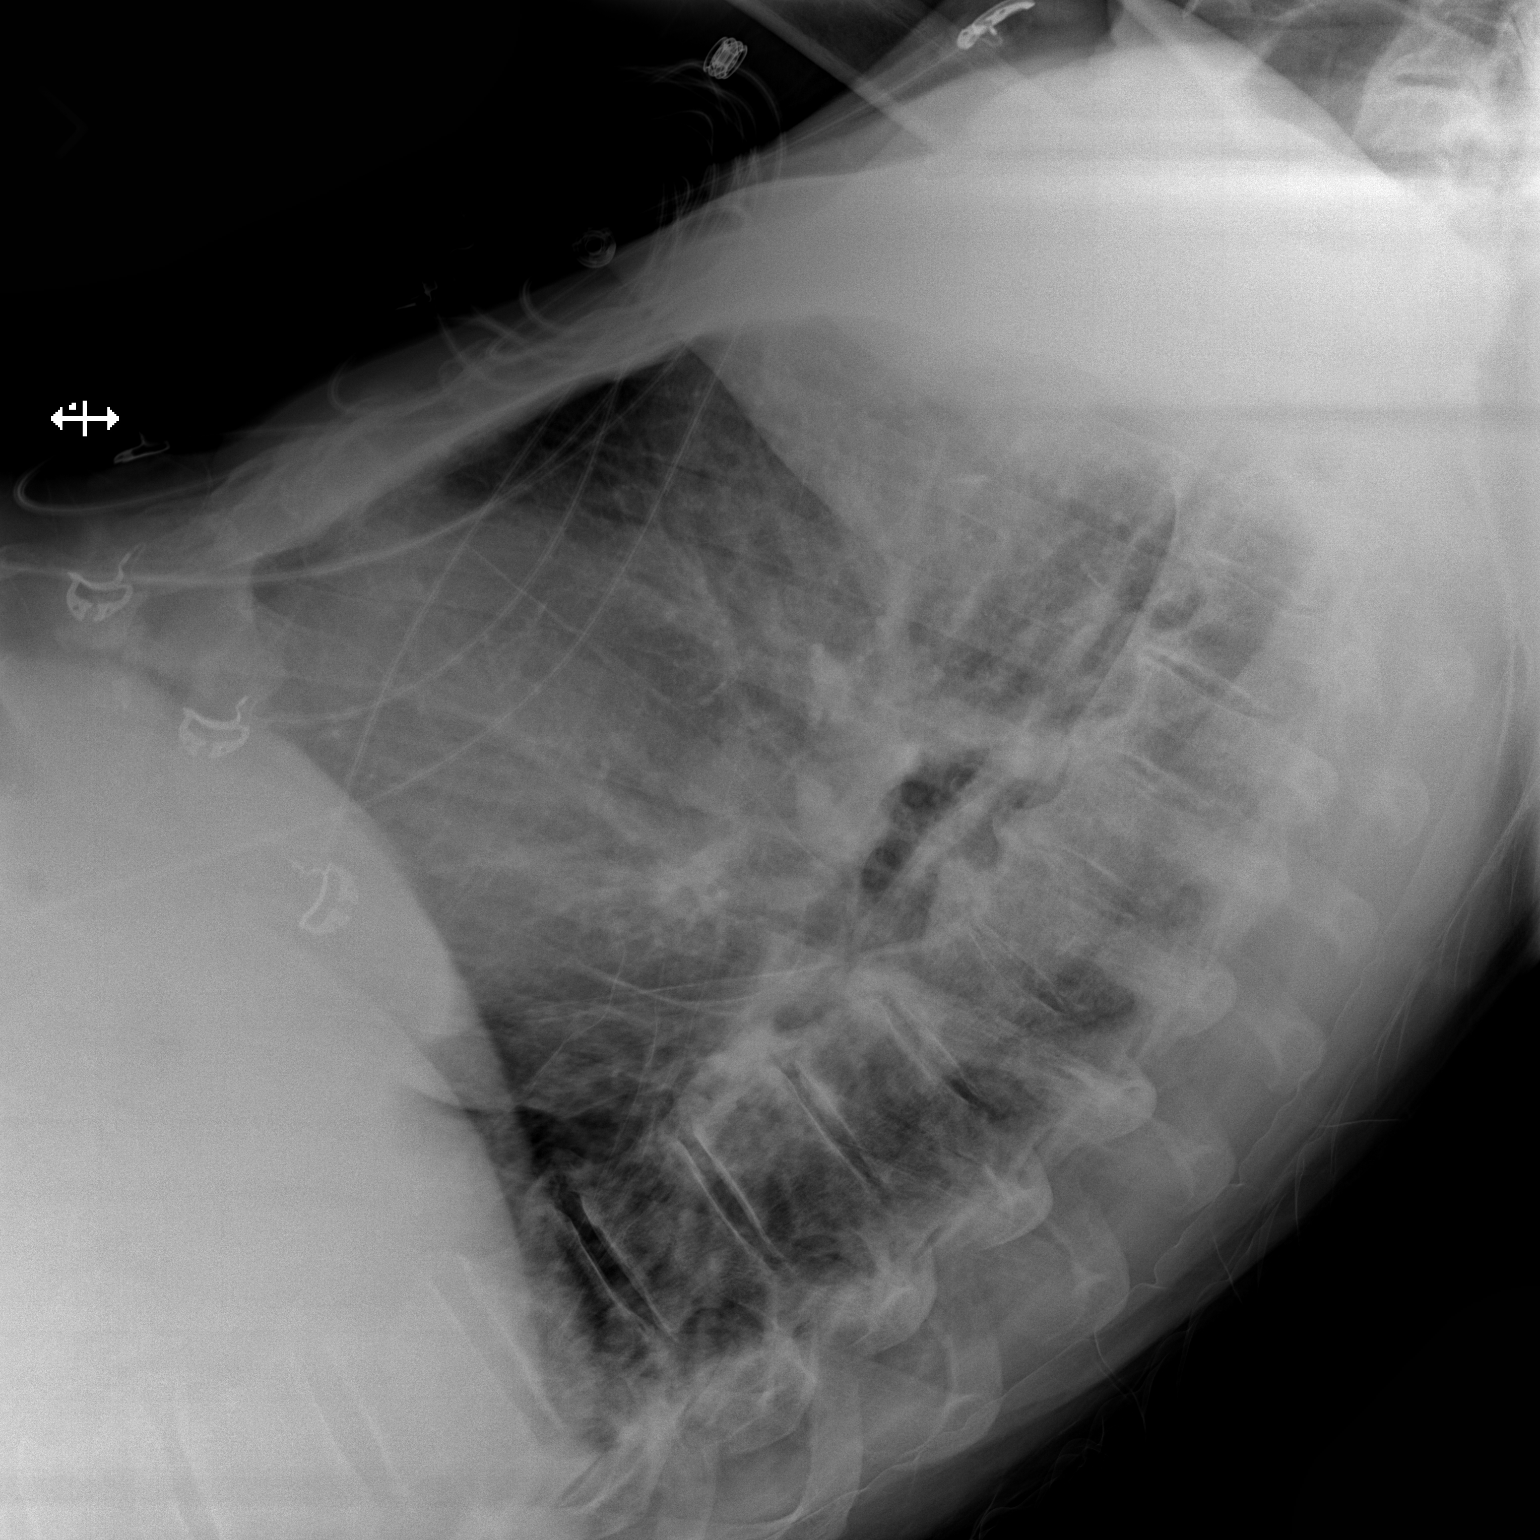

[x chest ap]
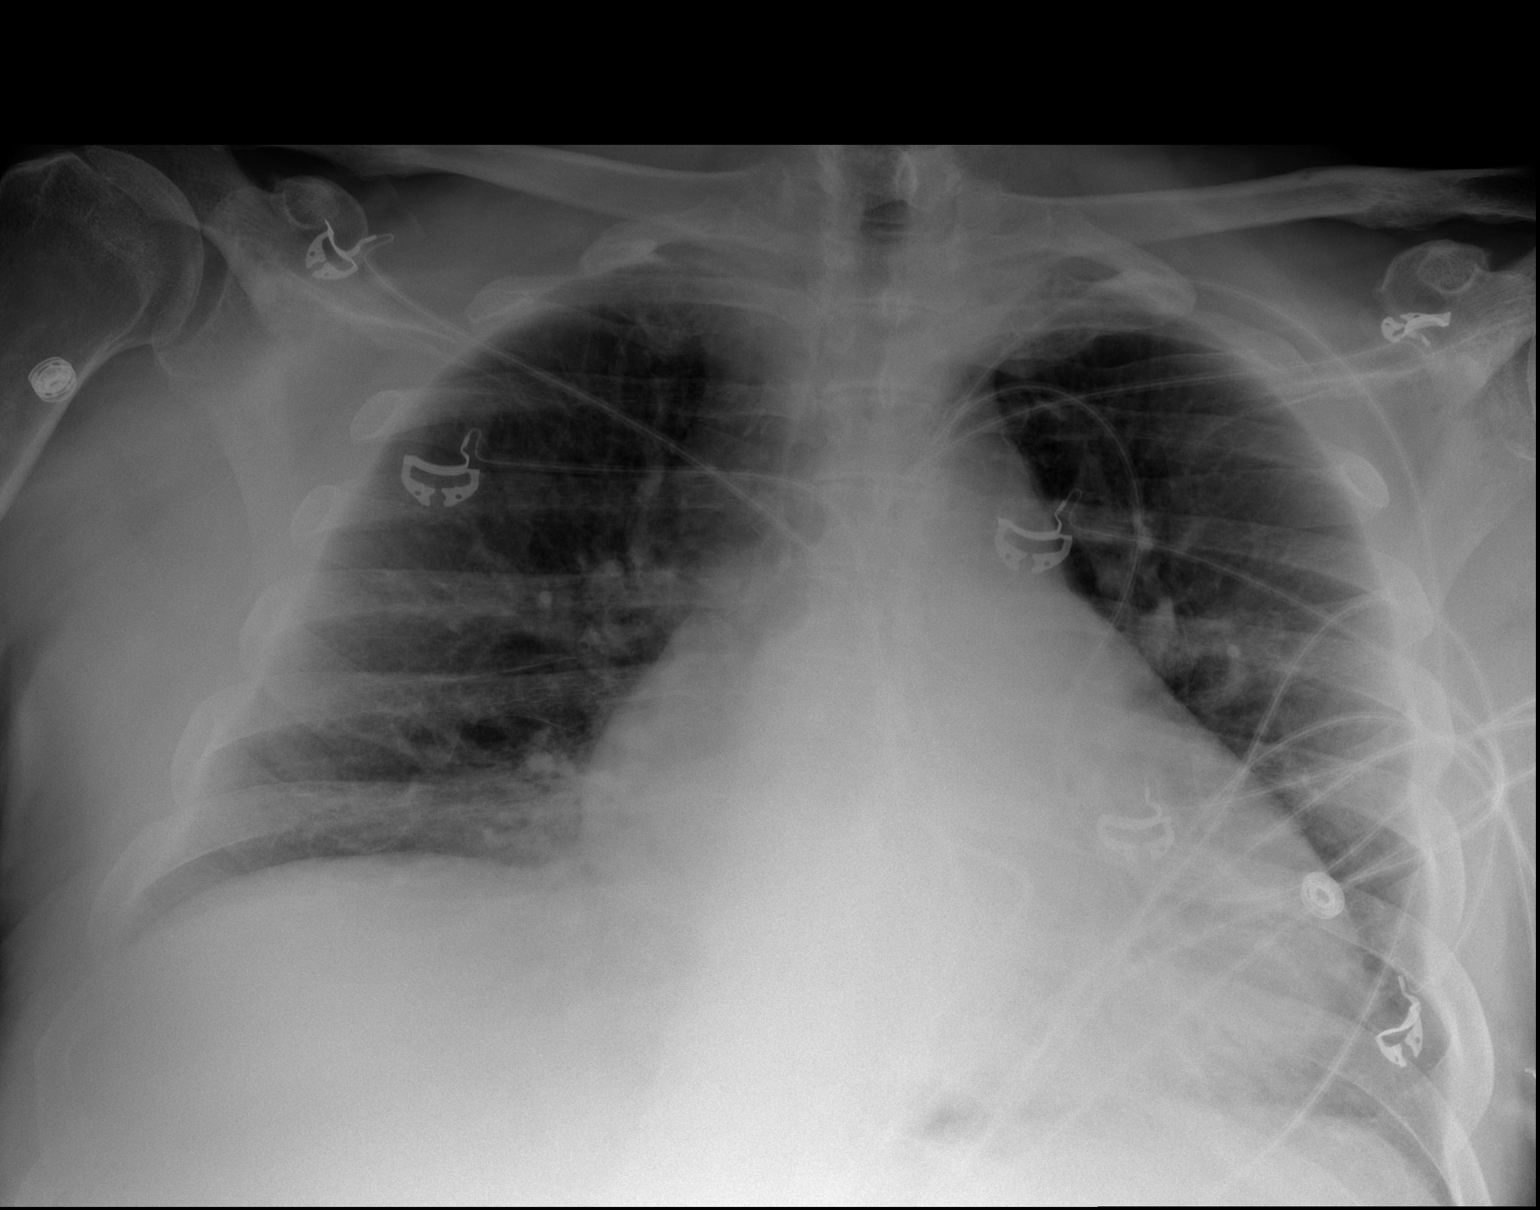

[2 of 2 positions shown; findings below may reference images not displayed]

FINDINGS: Cardiomegaly again noted. Limited study by poor inspiration. No
segmental infiltrate or pulmonary edema. Mild basilar atelectasis.
Degenerative changes mid and lower thoracic spine
IMPRESSION: Limited study by poor inspiration. Mild basilar atelectasis.
Cardiomegaly. No segmental infiltrate or pulmonary edema.

## 2017-09-23 ENCOUNTER — Telehealth: Payer: Self-pay

## 2017-09-23 ENCOUNTER — Inpatient Hospital Stay: Payer: Medicare Other

## 2017-09-23 ENCOUNTER — Inpatient Hospital Stay: Payer: Medicare Other | Attending: Oncology | Admitting: Oncology

## 2017-09-23 VITALS — BP 134/70 | HR 69 | Temp 97.6°F | Resp 17 | Ht 79.0 in | Wt 277.7 lb

## 2017-09-23 DIAGNOSIS — K760 Fatty (change of) liver, not elsewhere classified: Secondary | ICD-10-CM | POA: Insufficient documentation

## 2017-09-23 DIAGNOSIS — Z79899 Other long term (current) drug therapy: Secondary | ICD-10-CM | POA: Diagnosis not present

## 2017-09-23 DIAGNOSIS — K8020 Calculus of gallbladder without cholecystitis without obstruction: Secondary | ICD-10-CM | POA: Diagnosis not present

## 2017-09-23 DIAGNOSIS — D696 Thrombocytopenia, unspecified: Secondary | ICD-10-CM | POA: Diagnosis not present

## 2017-09-23 DIAGNOSIS — D72819 Decreased white blood cell count, unspecified: Secondary | ICD-10-CM | POA: Insufficient documentation

## 2017-09-23 DIAGNOSIS — N289 Disorder of kidney and ureter, unspecified: Secondary | ICD-10-CM | POA: Insufficient documentation

## 2017-09-23 LAB — CBC WITH DIFFERENTIAL/PLATELET
BASOS ABS: 0 10*3/uL (ref 0.0–0.1)
Basophils Relative: 1 %
Eosinophils Absolute: 0 10*3/uL (ref 0.0–0.5)
Eosinophils Relative: 1 %
HEMATOCRIT: 49.4 % (ref 38.4–49.9)
Hemoglobin: 16.5 g/dL (ref 13.0–17.1)
LYMPHS PCT: 31 %
Lymphs Abs: 0.8 10*3/uL — ABNORMAL LOW (ref 0.9–3.3)
MCH: 31.3 pg (ref 27.2–33.4)
MCHC: 33.3 g/dL (ref 32.0–36.0)
MCV: 93.9 fL (ref 79.3–98.0)
MONOS PCT: 13 %
Monocytes Absolute: 0.3 10*3/uL (ref 0.1–0.9)
NEUTROS ABS: 1.4 10*3/uL — AB (ref 1.5–6.5)
Neutrophils Relative %: 54 %
Platelets: 79 10*3/uL — ABNORMAL LOW (ref 140–400)
RBC: 5.27 MIL/uL (ref 4.20–5.82)
RDW: 14 % (ref 11.0–14.6)
WBC: 2.5 10*3/uL — ABNORMAL LOW (ref 4.0–10.3)

## 2017-09-23 LAB — COMPREHENSIVE METABOLIC PANEL
ALBUMIN: 3.9 g/dL (ref 3.5–5.0)
ALT: 18 U/L (ref 0–55)
ANION GAP: 8 (ref 3–11)
AST: 20 U/L (ref 5–34)
Alkaline Phosphatase: 66 U/L (ref 40–150)
BUN: 16 mg/dL (ref 7–26)
CO2: 27 mmol/L (ref 22–29)
Calcium: 10.6 mg/dL — ABNORMAL HIGH (ref 8.4–10.4)
Chloride: 106 mmol/L (ref 98–109)
Creatinine, Ser: 1.2 mg/dL (ref 0.70–1.30)
GFR calc Af Amer: >60 mL/min (ref >60)
GFR calc non Af Amer: 54 mL/min — ABNORMAL LOW (ref >60)
GLUCOSE: 118 mg/dL (ref 70–140)
POTASSIUM: 4.2 mmol/L (ref 3.5–5.1)
SODIUM: 141 mmol/L (ref 136–145)
Total Bilirubin: 1.2 mg/dL (ref 0.2–1.2)
Total Protein: 6.5 g/dL (ref 6.4–8.3)

## 2017-09-23 NOTE — Progress Notes (Signed)
Hematology and Oncology Follow Up Visit  Peter Singh [id number] 1932/01/17 84 y.o. 09/23/2017 8:22 AM Peter Singh, MDRusso, Peter Reichmann, MD   Principle Diagnosis: 84 year old man with thrombocytopenia.  Differential diagnosis include immune thrombocytopenia versus early myelodysplastic syndrome.  These findings are chronic back to at least 2017.   Current therapy: Active surveillance without any need for intervention.  Interim History:  Peter Singh is here for a follow-up visit.  Reports no major changes since the last visit.  He denies any active bleeding at this time.  He did not report any epistaxis, hematochezia or melena.  He denies any excessive fatigue or tiredness although at times he does have difficulties with low energy in the morning.  He still ambulates with the help of a walker without any falls or syncope.  He had not had any hospitalization or illnesses.  He remains on warfarin without any issues at this time.  He does not report any headaches, blurry vision, syncope or seizures. He does not report any fevers, chills, sweats or weight loss. He does not report any chest pain, palpitation, orthopnea or leg edema. He does not report any cough, wheezing or hemoptysis. He does not report any nausea, vomiting or abdominal pain. He does not report any constipation or diarrhea. He does not report any frequency urgency or hesitancy.  He does not report any skin rashes or lymphadenopathy.  He does not report any arthralgias or myalgias.  Remaining review of systems is negative.   Medications: I have reviewed the patient's current medications.  Current Outpatient Medications  Medication Sig Dispense Refill  . acetaminophen (TYLENOL) 500 MG tablet Take 500 mg by mouth every morning. Document area of discomfort and effectiveness of medication.     Marland Kitchen atorvastatin (LIPITOR) 20 MG tablet Take 20 mg by mouth every evening.     . donepezil (ARICEPT) 10 MG tablet TAKE 1 TABLET ONCE DAILY. 90 tablet 3   . folic acid (FOLVITE) 1 MG tablet Take 1 tablet (1 mg total) by mouth daily.    Marland Kitchen gabapentin (NEURONTIN) 300 MG capsule Take 300 mg by mouth at bedtime as needed (pain).    . hydrochlorothiazide (MICROZIDE) 12.5 MG capsule Take 12.5 mg by mouth every other day.    . memantine (NAMENDA) 5 MG tablet Take one pill in the morning for 2 weeks and then increase to 1 pill two times a day 60 tablet 12  . methenamine (MANDELAMINE) 1 g tablet Take 1,000 mg by mouth 2 (two) times daily.    . Tamsulosin HCl (FLOMAX) 0.4 MG CAPS Take 0.8 mg by mouth daily after supper.     . thiamine 100 MG tablet Take 1 tablet (100 mg total) by mouth daily.    . vitamin C (ASCORBIC ACID) 500 MG tablet Take 500 mg by mouth 2 (two) times daily.    Marland Kitchen warfarin (COUMADIN) 7.5 MG tablet TAKE 1 TABLET DAILY OR AS DIRECTED. 30 tablet 3   No current facility-administered medications for this visit.      Allergies:  Allergies  Allergen Reactions  . Penicillins Rash    Has patient had a PCN reaction causing immediate rash, facial/tongue/throat swelling, SOB or lightheadedness with hypotension: unknown Has patient had a PCN reaction causing severe rash involving mucus membranes or skin necrosis: unknown Has patient had a PCN reaction that required hospitalization : unknown Has patient had a PCN reaction occurring within the last 10 years: no If all of the above answers are "NO", then may  proceed with Cephalosporin use.     Past Medical History, Surgical history, Social history, and Family History unchanged from previous examination.    Physical Exam: Blood pressure 134/70, pulse 69, temperature 97.6 F (36.4 C), temperature source Oral, resp. rate 17, height _0  (2.007 m), weight 277 lb 11.2 oz (126 kg), SpO2 97 %.   ECOG: 1 General appearance: Alert, awake gentleman appeared comfortable.   Head: Normocephalic, without obvious abnormality  Oropharynx: No thrush or ulcers. Eyes: No scleral icterus. Lymph nodes:  Cervical, supraclavicular, and axillary nodes normal. Heart:regular rate and rhythm, S1, S2 normal, no murmur, click, rub or gallop Lung: Clear to auscultation without any rhonchi, wheezes or dullness to percussion. Abdomin: Soft, nontender without any rebound or guarding.  No shifting dullness or ascites. Musculoskeletal: No joint deformity or effusion. Skin: No petechia or ecchymosis.  Lab Results: Lab Results  Component Value Date   WBC 2.6 (L) 03/24/2017   HGB 15.3 03/24/2017   HCT 44.9 03/24/2017   MCV 92.4 03/24/2017   PLT 89 (L) 03/24/2017     Chemistry      Component Value Date/Time   NA 145 10/13/2016 0300   NA 144 09/17/2016 0742   K 3.9 10/13/2016 0300   K 4.1 09/17/2016 0742   CL 113 (H) 10/10/2016 0620   CO2 25 10/10/2016 0620   CO2 30 (H) 09/17/2016 0742   BUN 17 10/13/2016 0300   BUN 18.1 09/17/2016 0742   CREATININE 1.1 10/13/2016 0300   CREATININE 1.56 (H) 10/10/2016 0620   CREATININE 1.2 09/17/2016 0742   GLU 111 10/13/2016 0300      Component Value Date/Time   CALCIUM 9.0 10/10/2016 0620   CALCIUM 10.4 09/17/2016 0742   ALKPHOS 57 10/07/2016 1405   ALKPHOS 77 09/17/2016 0742   AST 35 10/07/2016 1405   AST 22 09/17/2016 0742   ALT 22 10/07/2016 1405   ALT 21 09/17/2016 0742   BILITOT 2.8 (H) 10/07/2016 1405   BILITOT 1.38 (H) 09/17/2016 0742      EXAM: ABDOMEN ULTRASOUND COMPLETE  COMPARISON:  CT 07/18/2016  FINDINGS: Gallbladder: Small layering stones, the largest 6 mm. No wall thickening or sonographic Murphy sign.  Common bile duct: Diameter: Normal caliber, 5 mm.  Liver: Increased echotexture compatible with fatty infiltration. No focal abnormality or biliary ductal dilatation. Portal vein is patent on color Doppler imaging with normal direction of blood flow towards the liver.  IVC: No abnormality visualized.  Pancreas: Visualized portion unremarkable.  Spleen: Normal craniocaudal length, 7.6 cm.  No focal  abnormality.  Right Kidney: Length: 12.9 cm. No hydronephrosis. Solid-appearing lesion off the upper pole of the right kidney measures 2.9 x 2.4 x 2.1 cm compatible with the solid lesions seen on prior CT.  Left Kidney: Length: 12.1 cm. Echogenicity within normal limits. No mass or hydronephrosis visualized.  Abdominal aorta: No aneurysm visualized.  Other findings: None.  IMPRESSION: No evidence of hepatomegaly or splenomegaly.  Fatty infiltration of the liver.  Cholelithiasis.  Stable solid renal lesion in the upper pole of the right kidney concerning for renal neoplasm.    Impression and Plan:  84 year old man with:   1. Thrombocytopenia that is chronic in nature dating back to 2013 and has been stable since 2017.  His CBC was reviewed today and showed a platelet count close to 80,000 which has not changed in the last 2 years.  He has no active bleeding noted since the last visit.  The differential diagnosis was discussed  again today which includes immune thrombocytopenia, early myelodysplastic syndrome versus reactive findings.  Splenic sequestration appears to be less likely with ultrasound that obtained in September 2018 showed no splenomegaly or evidence of cirrhosis of the liver.  From a management standpoint, I recommended observation and surveillance at this time.  See no need for intervention unless his platelet count drops below 50,000.  At that time, he will require a bone marrow biopsy for further characterization of his condition.  2. Leukocytopenia without neutropenia.  His differential was within normal range at this time with an absolute neutrophil count of 1500.  The plan is to continue with active surveillance and consider bone marrow biopsy he develops any worsening cytopenias in the future or abnormalities on his peripheral smear.  3. Follow-up: Will be in 6 months.   15  minutes was spent with the patient face-to-face today.  More than 50% of  time was dedicated to patient counseling, education and coordination of his care.     Zola Button, MD 3/20/20198:22 AM

## 2017-09-23 NOTE — Telephone Encounter (Signed)
Printed avs and calender of upcoming appointment. Per 3/20 los 

## 2017-10-01 MED FILL — SHINGRIX 50 MCG SUS: 50 | 1 days supply | Qty: 1 | Fill #0

## 2017-10-08 ENCOUNTER — Ambulatory Visit (INDEPENDENT_AMBULATORY_CARE_PROVIDER_SITE_OTHER): Payer: Medicare Other | Admitting: Interventional Cardiology

## 2017-10-08 DIAGNOSIS — Z5181 Encounter for therapeutic drug level monitoring: Secondary | ICD-10-CM | POA: Diagnosis not present

## 2017-10-08 DIAGNOSIS — I4891 Unspecified atrial fibrillation: Secondary | ICD-10-CM

## 2017-10-08 DIAGNOSIS — Z7901 Long term (current) use of anticoagulants: Secondary | ICD-10-CM | POA: Diagnosis not present

## 2017-10-08 LAB — PROTIME-INR: INR: 2.4 — AB (ref 0.9–1.1)

## 2017-10-08 NOTE — Patient Instructions (Signed)
Description   Spoke with pt's wife, advised to  continue same dose 1 tablet daily except 1/2 tablet on Mondays, Wednesdays and Fridays.  Order faxed back to [facility] to check INR in 4 weeks and fax results to [facility] at [phone]. Order faxed to [facility] [phone].

## 2017-10-21 DIAGNOSIS — L570 Actinic keratosis: Secondary | ICD-10-CM | POA: Diagnosis not present

## 2017-10-21 DIAGNOSIS — L111 Transient acantholytic dermatosis [Grover]: Secondary | ICD-10-CM | POA: Diagnosis not present

## 2017-10-21 DIAGNOSIS — L821 Other seborrheic keratosis: Secondary | ICD-10-CM | POA: Diagnosis not present

## 2017-10-21 DIAGNOSIS — C44311 Basal cell carcinoma of skin of nose: Secondary | ICD-10-CM | POA: Diagnosis not present

## 2017-10-21 DIAGNOSIS — L720 Epidermal cyst: Secondary | ICD-10-CM | POA: Diagnosis not present

## 2017-10-21 DIAGNOSIS — D1801 Hemangioma of skin and subcutaneous tissue: Secondary | ICD-10-CM | POA: Diagnosis not present

## 2017-10-21 DIAGNOSIS — Z85820 Personal history of malignant melanoma of skin: Secondary | ICD-10-CM | POA: Diagnosis not present

## 2017-10-21 DIAGNOSIS — D225 Melanocytic nevi of trunk: Secondary | ICD-10-CM | POA: Diagnosis not present

## 2017-10-21 DIAGNOSIS — Z85828 Personal history of other malignant neoplasm of skin: Secondary | ICD-10-CM | POA: Diagnosis not present

## 2017-10-21 IMAGING — US US RENAL
1 series · 14 of 25 positions shown · non-contrast
Comparison: 07/18/2016 CT abdomen and pelvis

CLINICAL DATA: Acute renal failure, history melanoma, atrial
fibrillation, hypertension, former smoker

EXAM:
RENAL / URINARY TRACT ULTRASOUND COMPLETE

[Series 1: us renal · 0.27mm/px · 14 of 44 slices shown]
[im 1/44]
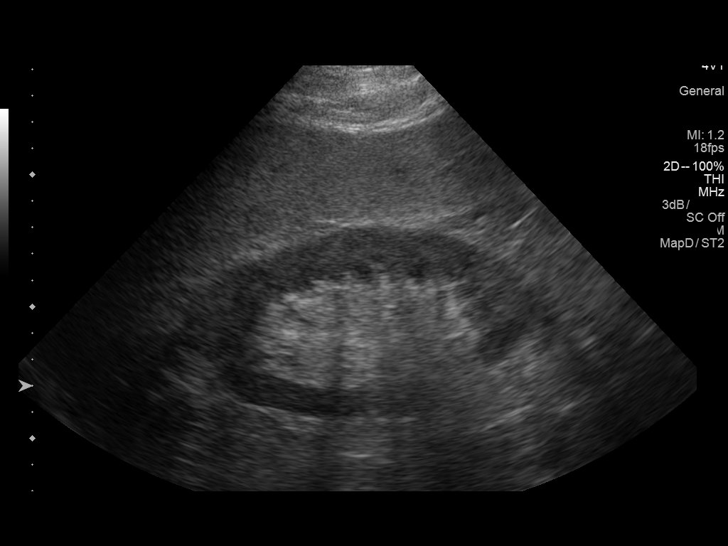
[im 4/44]
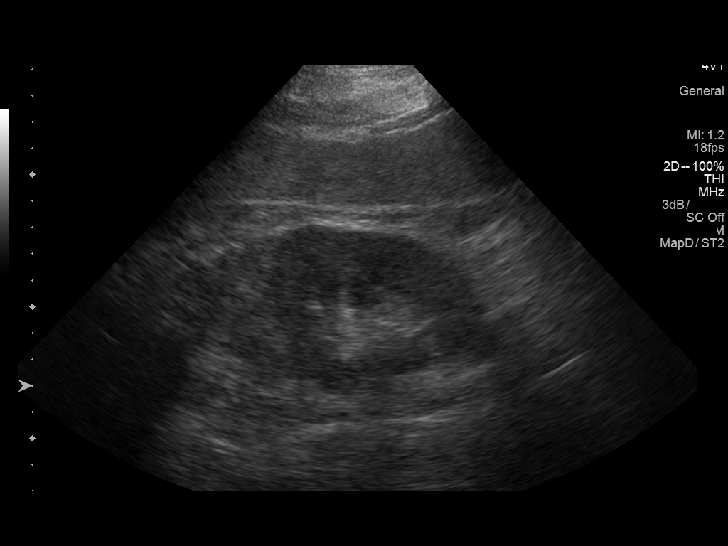
[im 8/44]
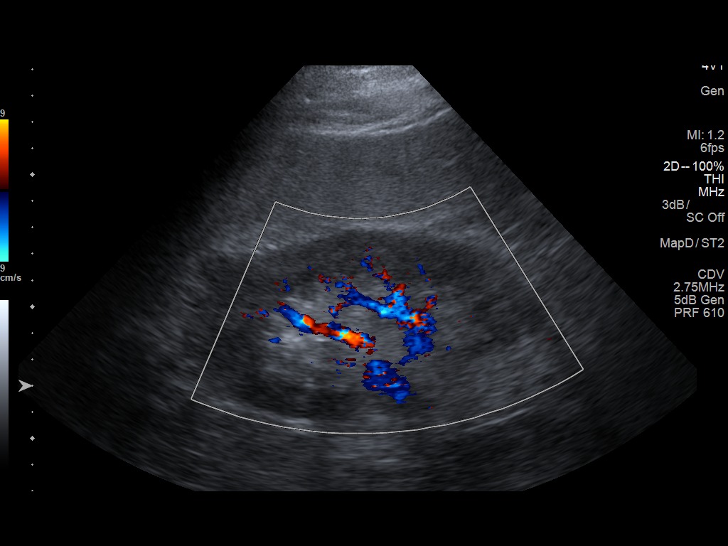
[im 11/44]
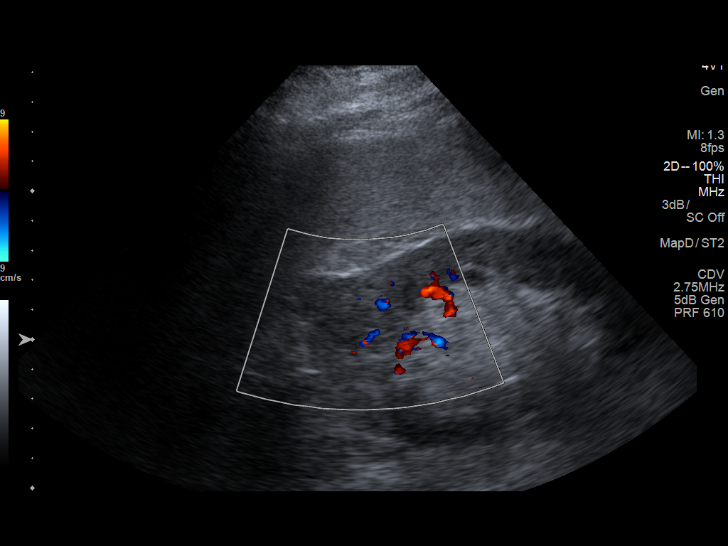
[im 15/44]
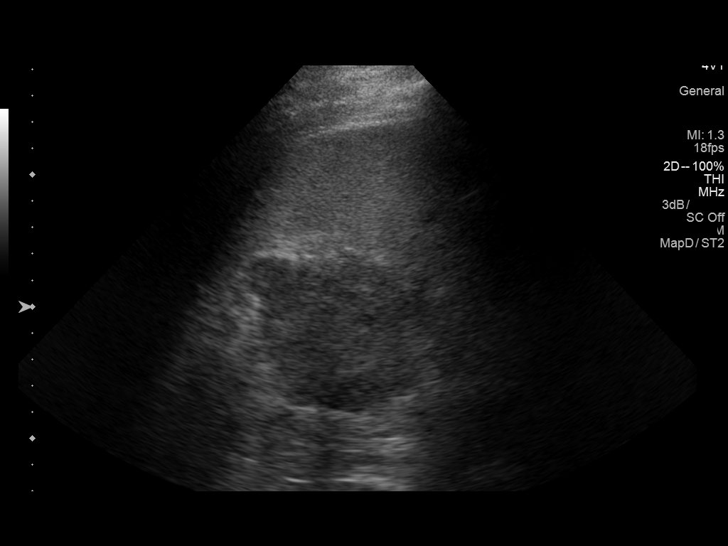
[im 17/44]
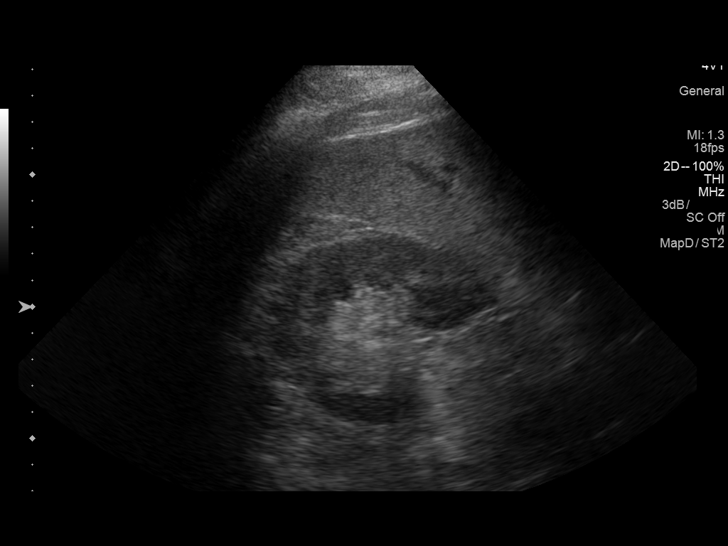
[im 20/44]
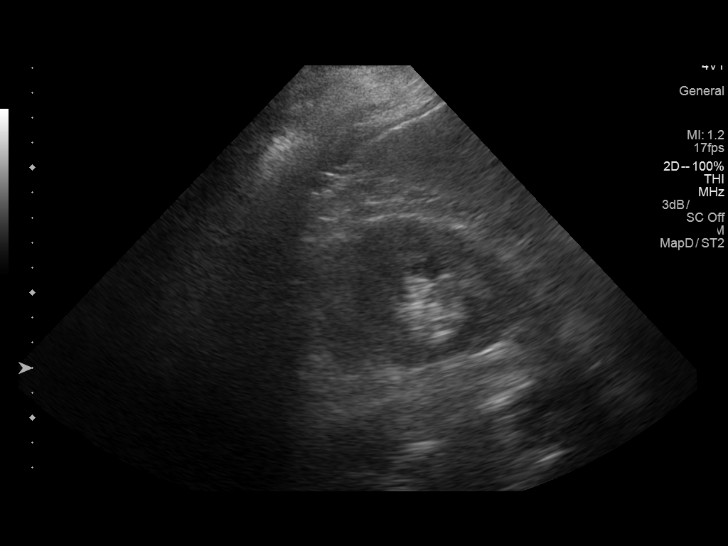
[im 24/44]
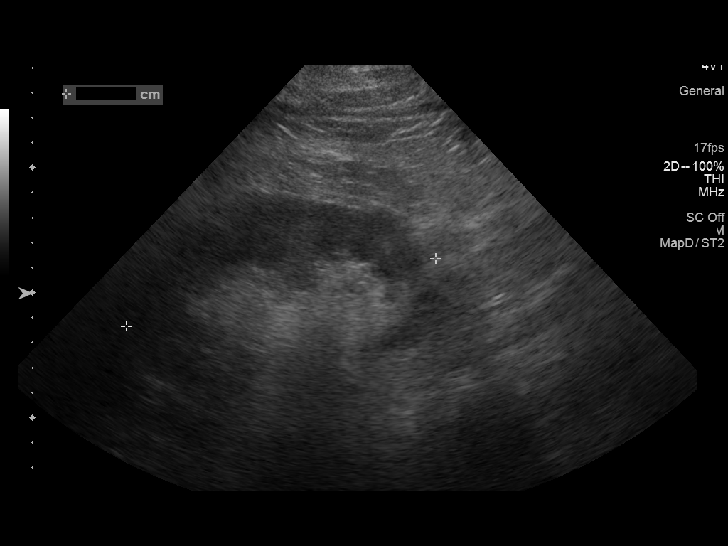
[im 27/44]
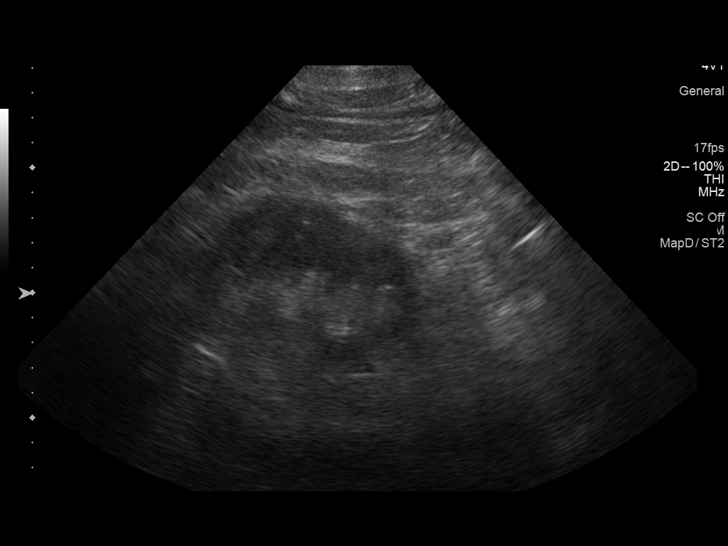
[im 29/44]
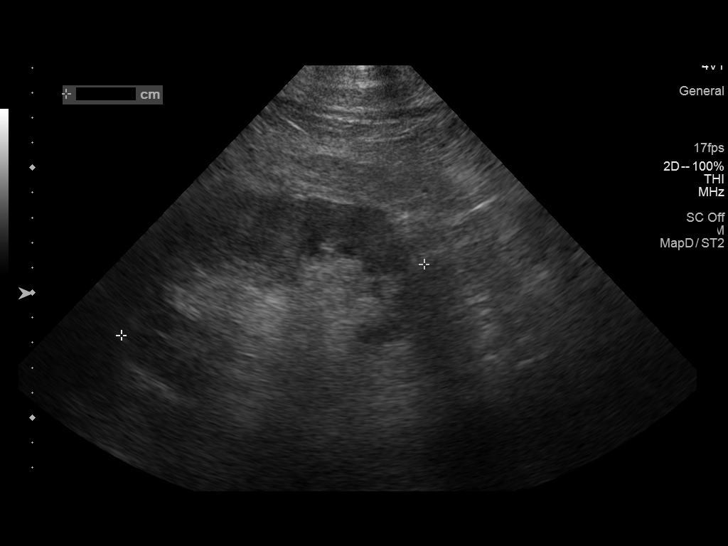
[im 33/44]
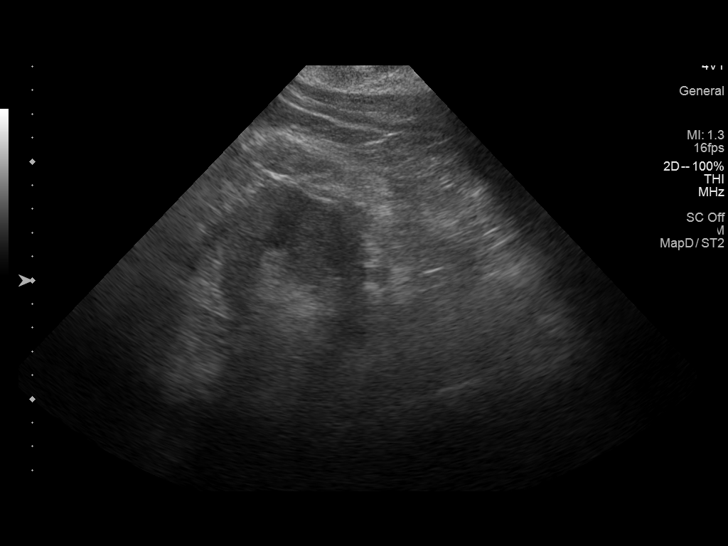
[im 36/44]
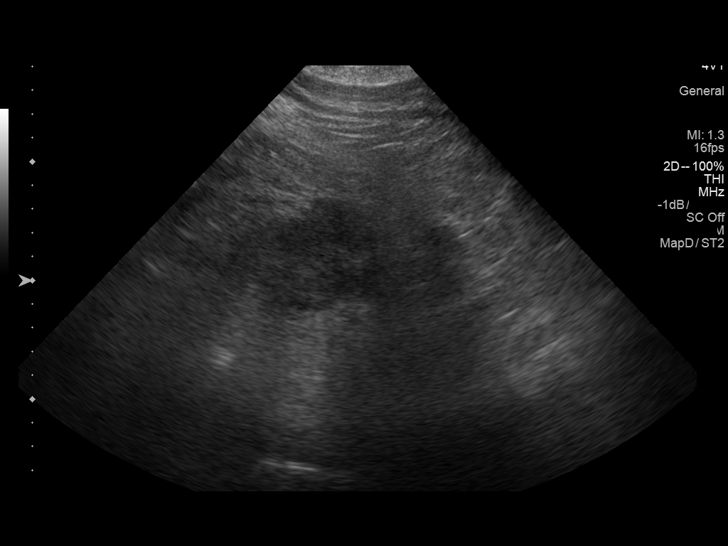
[im 40/44]
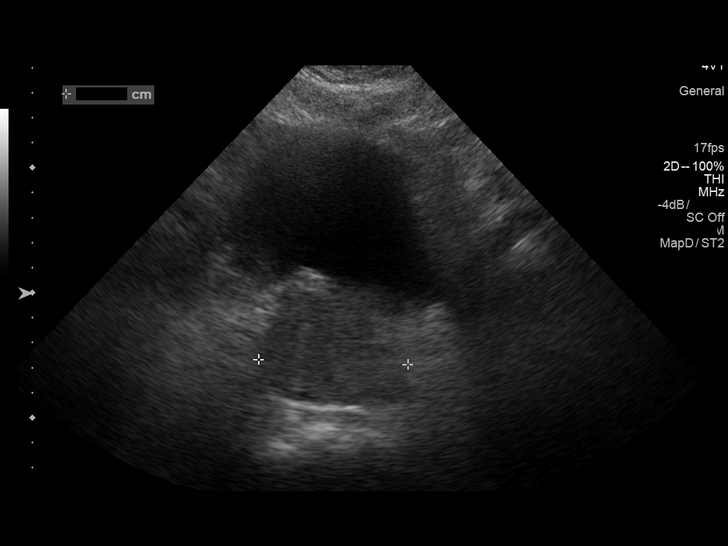
[im 44/44]
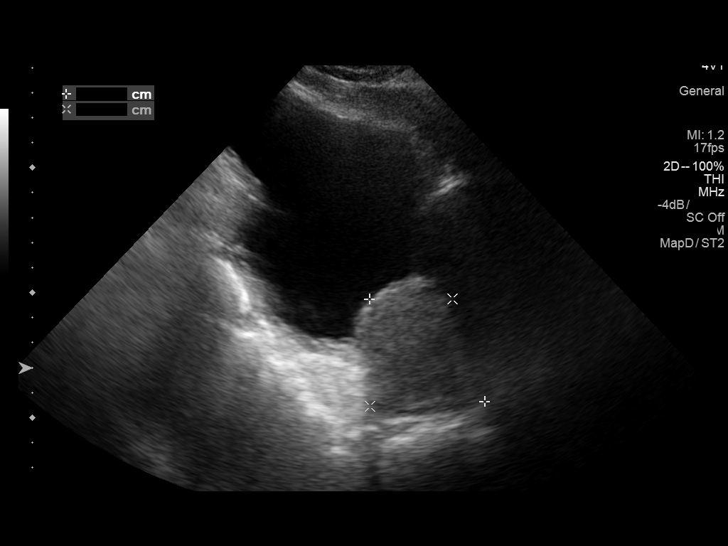

[14 of 25 positions shown; findings below may reference images not displayed]

FINDINGS: Right Kidney:

Length: 13.6 cm. Normal cortical thickness. Upper normal cortical
echogenicity. Isoechoic exophytic solid nodule at the upper pole
x 2.0 x 1.9 cm. This appear to be part of a larger nodule seen on
arterial phase imaging on the prior CT exam up to 3.2 cm diameter,
the remainder of which is not sonographically evident. No additional
mass, hydronephrosis, or shadowing calcification.

Left Kidney:

Length: 12.7 cm. Normal cortical thickness. Borderline increased
echogenicity. No mass, hydronephrosis or shadowing calcification.

Bladder:

Normal appearance.

Incidentally noted prostatic enlargement gland 6.0 x 6.2 x 5.4 cm.
IMPRESSION: Prostatic enlargement.

Solid nodule at upper pole of RIGHT kidney 1.9 x 2.0 x 1.9 cm which
by prior CT exam appears to be part of a larger nodule the remainder
of which is sonographically in apparent, concerning for renal
neoplasm.

## 2017-11-02 DIAGNOSIS — Z85828 Personal history of other malignant neoplasm of skin: Secondary | ICD-10-CM | POA: Diagnosis not present

## 2017-11-02 DIAGNOSIS — C44321 Squamous cell carcinoma of skin of nose: Secondary | ICD-10-CM | POA: Diagnosis not present

## 2017-11-02 DIAGNOSIS — Z85820 Personal history of malignant melanoma of skin: Secondary | ICD-10-CM | POA: Diagnosis not present

## 2017-11-05 ENCOUNTER — Ambulatory Visit (INDEPENDENT_AMBULATORY_CARE_PROVIDER_SITE_OTHER): Payer: Medicare Other | Admitting: Pharmacist

## 2017-11-05 DIAGNOSIS — Z5181 Encounter for therapeutic drug level monitoring: Secondary | ICD-10-CM | POA: Diagnosis not present

## 2017-11-05 DIAGNOSIS — I4891 Unspecified atrial fibrillation: Secondary | ICD-10-CM | POA: Diagnosis not present

## 2017-11-05 LAB — POCT INR: INR: 3

## 2017-11-09 ENCOUNTER — Ambulatory Visit: Payer: Medicare Other | Admitting: Neurology

## 2017-11-18 ENCOUNTER — Other Ambulatory Visit: Payer: Self-pay | Admitting: Neurology

## 2017-11-18 DIAGNOSIS — G3184 Mild cognitive impairment, so stated: Secondary | ICD-10-CM

## 2017-11-23 ENCOUNTER — Ambulatory Visit (INDEPENDENT_AMBULATORY_CARE_PROVIDER_SITE_OTHER): Payer: Medicare Other | Admitting: Neurology

## 2017-11-23 ENCOUNTER — Encounter: Payer: Self-pay | Admitting: Neurology

## 2017-11-23 VITALS — BP 121/67 | HR 65 | Ht 79.0 in | Wt 284.0 lb

## 2017-11-23 DIAGNOSIS — F0390 Unspecified dementia without behavioral disturbance: Secondary | ICD-10-CM | POA: Diagnosis not present

## 2017-11-23 DIAGNOSIS — G609 Hereditary and idiopathic neuropathy, unspecified: Secondary | ICD-10-CM | POA: Diagnosis not present

## 2017-11-23 DIAGNOSIS — G3184 Mild cognitive impairment, so stated: Secondary | ICD-10-CM

## 2017-11-23 DIAGNOSIS — F03A0 Unspecified dementia, mild, without behavioral disturbance, psychotic disturbance, mood disturbance, and anxiety: Secondary | ICD-10-CM

## 2017-11-23 MED ORDER — DONEPEZIL HCL 10 MG PO TABS: 10.0000 mg | ORAL_TABLET | Freq: Every day | ORAL | 4 refills | Status: DC

## 2017-11-23 MED ORDER — MEMANTINE HCL 5 MG PO TABS: ORAL_TABLET | ORAL | 4 refills | Status: DC

## 2017-11-23 NOTE — Progress Notes (Signed)
[id number]    Provider:  Dr Peter Singh Referring Provider: [name], MD Primary Care Physician:  Peter Baton, MD   CC: Cognitive changes  Interval history 11/2017: Calls himself Peter Singh, wife's name is Peter Singh. He is repeating more things in the same day, no driving, wife pays the bills, he doesn't play golf anymore, no interest in hobbies, they used to travel but physically that is harder, kids visit often. Did not remember wife's birthday or anniversary today. He has a lack of energy. More apathy in doing things. He is very tired, he may sleep until 3pm several days, no significant nodding off but he is excessively tired. He will lay down during the day often and nap. Discussed getting a sleep test but he doesn't snore. Very pleasant and friendly personality. They feel like they are in a good spot for their age, they are not interested in increasing medication or getting specialized imaging or formal memory testing. Numbness in the feet stable, he uses the walker. He does not drive. They decline sleep apnea test. They feel Stable at this time.   Interval history 11/05/2016: He is using a walker. Feels more steady. He continues to have short-term memory loss and repeats things more often. Here with his wife who provides more information. Discussed clinical trials and dementia, discussed mild cognitive impairment (he was diagnosed via neurocognitive testing with Dr Peter Singh in 2016), and discussed other specialized imaging to help evaluate with memory changes and diagnosis as well as repeating formal neurocognitive testing. Patient and wife are not interested, especially since it will likely not change management. We'll start patient on Namenda. Also discussed increasing Aricept. Discussed mild cognitive impairment and that a third people will progress to dementia. Follow-up 1 year.  Interval history: He is here with his wife. More short term memory loss. Wife is paying all the bills now.  Wife is here and provides information. They saw Dr. [name] last year. Discussed repeat testing if needed. He is not playing golf anymore. No significant snoring. No excessive daytime somnolence, sometimes he naps. He hasn't exercised as much. He has numbness in his feet, getting number, getting worse. No pain just discomfort. Slowly progressive still with the memory but nothing significant.  He has worsening short term memory loss. Memory loss is progressive.He repeats things multiple times. [facility] has gotten a liquor license.   HPI: Peter Singh is a 84 y.o. male here as a referral from Dr. Virgina Singh for memory loss. Having general loss of memory and forgetting names, asking the same thing over and over, forgetting appointments and names. Changes started several years ahgo, unsure when and are progressive. Wife accompanies and provides history. 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 It was benign. He is excessively tired tired during the day, he naps in the afternoons, some snoring at night. No witnessed apneic events. In his feet, he gets sharp pains in the feet. A few times he has gotten lost in the car. Was previously evaluated in my clinic for neuropathy, this is a new complaint. Patient has 2 drinks of bourbon a night, wife confirms. His recall of remote events appears intact, can recite the address of where he lived as achild. Appears to be more recent memories. He is still living independently with his wife and performs all his ADLs and IADLs. No other focal neurologic deficits. He has neuropathy. No FHx of neurodegenerative disease. He continues to have worsening numbness in the distal extremities.   Reviewed notes,  labs and imaging from outside physicians, which showed: hgba1c 6.0, TSH wnl, B12 WNL, folate WNL, Other normal labs include: IFE, Pan-ANCA, ace, hiv, esr, ana, rpr, hepc ab, crp, paraneoplastic ab panel, RF, B1, ethanol (neg), mma. No etiology for neuropathy found except glucose  intolerance and possibly alcohol use. EMG/NCS showed severe length-dependent axonal polyneuropathy.   Previous visit 07/04/2014 HPI: Peter Singh is a 84 y.o. male here as a referral from Dr. Virgina Singh for Neuropathy  Neuropathy started 4-5 years ago. No inciting factors, no medications that are know to cause neuropathy, no chemotherapy. He went to [facility] and they found a growth in his spinal column. He doesn't have any feeling in the tips of his fingers and in his feet, just numb. No tingling or burning. He is having fine motor skill difficulties. Dropping things. In all the fingers possibly in the whole hands. No cramping at night. Is the same all the time, continuous all day. It doesn't vary too much, not painful just numb. The changes go up to the ankles. Progressive. No burning just numbness. Balance is poor. He has to be careful to stop from falling. Stumbling. Has 2 drinks a day of bourbon. No chemotherapy, no long-term usage of medications that can cause neuropathy. 2 drinks of bourbon a day. No other focal neurologic symptoms. No FHx of neuropathy.   Reviewed notes, labs and imaging from outside physicians, which showed:   Ct showed No acute intracranial abnormalities including mass lesion or mass effect, hydrocephalus, extra-axial fluid collection, midline shift, hemorrhage, or acute infarction, large ischemic events (personally reviewed images), did show chronic mild global atrophy as well as chronic ischemic white matter non-specific changes. Last INR was 2.7.  Review of Systems: Patient complains of symptoms per HPI as well as the following symptoms: numbness. Denies CP or SOB Pertinent negatives per HPI. All others negative.   Social History   Socioeconomic History  . Marital status: Married    Spouse name: Not on file  . Number of children: 2  . Years of education: Not on file  . Highest education level: Not on file  Occupational History  . Occupation: retired  Photographer  . Financial resource strain: Not on file  . Food insecurity:    Worry: Not on file    Inability: Not on file  . Transportation needs:    Medical: Not on file    Non-medical: Not on file  Tobacco Use  . Smoking status: Former Smoker    Packs/day: 3.00    Types: Cigarettes    Last attempt to quit: 06/09/1975    Years since quitting: 42.4  . Smokeless tobacco: Never Used  Substance and Sexual Activity  . Alcohol use: Yes    Alcohol/week: 0.0 oz    Comment: 2 drinks per day  . Drug use: No  . Sexual activity: Not on file  Lifestyle  . Physical activity:    Days per week: Not on file    Minutes per session: Not on file  . Stress: Not on file  Relationships  . Social connections:    Talks on phone: Not on file    Gets together: Not on file    Attends religious service: Not on file    Active member of club or organization: Not on file    Attends meetings of clubs or organizations: Not on file    Relationship status: Not on file  . Intimate partner violence:    Fear of current or  ex partner: Not on file    Emotionally abused: Not on file    Physically abused: Not on file    Forced sexual activity: Not on file  Other Topics Concern  . Not on file  Social History Narrative   Patient is married with 2 children    Patient is retired    Patient lives at [place] History  Problem Relation Age of Onset  . Stroke Father   . Neuropathy Neg Hx     Past Medical History:  Diagnosis Date  . Atrial fibrillation ([place])   . Diverticulosis of colon (without mention of hemorrhage)   . Hyperlipemia   . Hypertension   . Melanoma ([place])   . Personal history of colonic polyps 1999 & 2004   adenomatous polyps  . Ventricular hypertrophy     Past Surgical History:  Procedure Laterality Date  . KNEE SURGERY     right  . PILONIDAL CYST DRAINAGE    . schwanoma tumor lumbar spine    . SPINE SURGERY     tumor removed  . TONSILLECTOMY AND ADENOIDECTOMY       Current Outpatient Medications  Medication Sig Dispense Refill  . acetaminophen (TYLENOL) 500 MG tablet Take 500 mg by mouth 2 (two) times daily. Document area of discomfort and effectiveness of medication.     Marland Kitchen atorvastatin (LIPITOR) 20 MG tablet Take 20 mg by mouth every evening.     . donepezil (ARICEPT) 10 MG tablet TAKE 1 TABLET ONCE DAILY. 90 tablet 3  . folic acid (FOLVITE) 1 MG tablet Take 1 tablet (1 mg total) by mouth daily.    . hydrochlorothiazide (MICROZIDE) 12.5 MG capsule Take 12.5 mg by mouth every other day.    . memantine (NAMENDA) 5 MG tablet 1 TABLET TWICE A DAY. 60 tablet 0  . methenamine (MANDELAMINE) 1 g tablet Take 1,000 mg by mouth 2 (two) times daily.    . Tamsulosin HCl (FLOMAX) 0.4 MG CAPS Take 0.8 mg by mouth daily after supper.     . thiamine 100 MG tablet Take 1 tablet (100 mg total) by mouth daily.    . vitamin C (ASCORBIC ACID) 500 MG tablet Take 500 mg by mouth 2 (two) times daily.    Marland Kitchen warfarin (COUMADIN) 7.5 MG tablet TAKE 1 TABLET DAILY OR AS DIRECTED. 30 tablet 3  . gabapentin (NEURONTIN) 300 MG capsule Take 300 mg by mouth at bedtime as needed (pain).     No current facility-administered medications for this visit.     Allergies as of 11/23/2017 - Review Complete 11/23/2017  Allergen Reaction Noted  . Penicillins Rash 04/08/2011    Vitals: BP 121/67 (BP Location: Right Arm, Patient Position: Sitting)   Pulse 65   Ht '6\' 7"'  (2.007 m)   Wt 284 lb (128.8 kg)   BMI 31.99 kg/m  Last Weight:  Wt Readings from Last 1 Encounters:  11/23/17 284 lb (128.8 kg)   Last Height:   Ht Readings from Last 1 Encounters:  11/23/17 '6\' 7"'  (2.007 m)    Physical exam: Exam: Gen: NAD, conversant, well nourised, obese, well groomed  CV: RRR, no MRG. No Carotid Bruits. Eyes: Conjunctivae clear without exudates or hemorrhage  Neuro: No changes Detailed Neurologic Exam  Speech:  Speech is normal; fluent and spontaneous with  normal comprehension.  Cognition:  MMSE - Mini Mental State Exam 11/23/2017 11/05/2016  Orientation to time 2 5  Orientation to Place 5 5  Registration 3 3  Attention/ Calculation 5 5  Recall 2 1  Language- name 2 objects 2 2  Language- repeat 1 1  Language- follow 3 step command 3 3  Language- read & follow direction 1 1  Write a sentence 1 1  Copy design 1 1  Total score 26 28    The patient is oriented to person, place, month/year;   recent memory impaired, remote memory intact;   language fluent; normal attention, concentration, fund of knowledge Cranial Nerves:  The pupils are equal, round, and reactive to light. Extraocular movements are intact. Trigeminal sensation is intact and the muscles of mastication are normal. The face is symmetric. The palate elevates in the midline. Hearing intact. Voice is normal. Shoulder shrug is normal. The tongue has normal motion without fasciculations.   Gait:  Bradykinetic with a walker   Motor Observation:  No asymmetry, no atrophy, and no involuntary movements noted. Tone:  Normal muscle tone.     Strength:  Strength is V/V in the upper and lower limbs.    Sensation:  (Stable) can't feel vibration in the toes, malleoli or knees. Can feel vibration in the fingers DIP.  Pp and temp decreased to elbows and above knees Proprioception absent in the great toes.  Reflex Exam:  DTR's:  Absent achilles    Assessment/Plan: 84 year old lovely male PMHx of afib on coumadin, HTN, HLD who is here for cognitive difficulties. Was evaluated previously for progressive numbness in the toes and fingers. Exam with decrease to all modalities in glove and stocking distribution. Formal neurocognitive testing 3 years ago diagnosed with mild cognitive impairment, we discussed that today as well as possible clinical trials, specialized imaging and other modalities of diagnosis and treatment of memory loss. They're  still not interested in further clinical research or specialized imaging or repeat neurocognitive testing but we will continue Namenda and aricept.  Neuropathy: No etiology for neuropathy found except glucose intolerance and possibly alcohol use. EMG/NCS showed severe length-dependent axonal polyneuropathy. Reports 2 drinks of bourbon a night, discussed that 14 drinks a week is the recommended limit for men and alcohol can be directly toxic to nerves. W  Cognitive changes: MCI. Recommended repeat they decline at this time  FDG PET Scan: Has symptoms of both Alzheimers (short term memory loss, repetition of statements, mesial temporal lobe atrophy) but also more apathetic which is FTD(mesial temporal lobe can also be FTD) and has many vascular change sin the brain which may indicate vascular dementia. FDG PET would be helpful in distinguishing. But they decline. At this point feels stable.  Sarina Ill, MD  [facility] [address] [place], [place] [address]  Phone [phone] Fax [phone]  A total of 25 minutes was spent face-to-face with this patient. Over half this time was spent on counseling patient on the neuropathy, mci diagnosis and different diagnostic and therapeutic options available.

## 2017-11-25 ENCOUNTER — Encounter (HOSPITAL_COMMUNITY): Payer: Self-pay

## 2017-11-25 ENCOUNTER — Emergency Department (HOSPITAL_COMMUNITY)
Admission: EM | Admit: 2017-11-25 | Discharge: 2017-11-26 | Disposition: A | Discharge status: Home / Self Care | Payer: Medicare Other | Attending: Emergency Medicine | Admitting: Emergency Medicine

## 2017-11-25 DIAGNOSIS — F0390 Unspecified dementia without behavioral disturbance: Secondary | ICD-10-CM | POA: Insufficient documentation

## 2017-11-25 DIAGNOSIS — Z85820 Personal history of malignant melanoma of skin: Secondary | ICD-10-CM | POA: Insufficient documentation

## 2017-11-25 DIAGNOSIS — Z7901 Long term (current) use of anticoagulants: Secondary | ICD-10-CM | POA: Diagnosis not present

## 2017-11-25 DIAGNOSIS — R339 Retention of urine, unspecified: Secondary | ICD-10-CM | POA: Diagnosis not present

## 2017-11-25 DIAGNOSIS — Z79899 Other long term (current) drug therapy: Secondary | ICD-10-CM | POA: Diagnosis not present

## 2017-11-25 DIAGNOSIS — I10 Essential (primary) hypertension: Secondary | ICD-10-CM | POA: Diagnosis not present

## 2017-11-25 DIAGNOSIS — Z87891 Personal history of nicotine dependence: Secondary | ICD-10-CM | POA: Insufficient documentation

## 2017-11-25 DIAGNOSIS — N390 Urinary tract infection, site not specified: Secondary | ICD-10-CM | POA: Insufficient documentation

## 2017-11-25 HISTORY — DX: Unspecified dementia, unspecified severity, without behavioral disturbance, psychotic disturbance, mood disturbance, and anxiety: F03.90

## 2017-11-25 LAB — URINALYSIS, ROUTINE W REFLEX MICROSCOPIC
BILIRUBIN URINE: NEGATIVE
GLUCOSE, UA: NEGATIVE mg/dL
KETONES UR: NEGATIVE mg/dL
NITRITE: POSITIVE — AB
PROTEIN: 100 mg/dL — AB
Specific Gravity, Urine: 1.018 (ref 1.005–1.030)
pH: 5 (ref 5.0–8.0)

## 2017-11-25 MED ORDER — CEPHALEXIN 500 MG PO CAPS
1000.0000 mg | ORAL_CAPSULE | Freq: Once | ORAL | Status: AC
  Administered 2017-11-26: 500 mg via ORAL
  Filled 2017-11-25: qty 2

## 2017-11-25 MED ORDER — CEPHALEXIN 500 MG PO CAPS: 500.0000 mg | ORAL_CAPSULE | Freq: Two times a day (BID) | ORAL | 0 refills | Status: DC

## 2017-11-25 NOTE — ED Notes (Signed)
Performed a bladder scan performed with Dr. Lenna Sciara. [place]. Scanned several times with only 11 mL. Patient tolerated it well.

## 2017-11-25 NOTE — ED Provider Notes (Addendum)
[facility] DEPT Provider Note: Georgena Spurling, MD, FACEP  CSN: [id number] MRN: [id number] ARRIVAL: 11/25/17 at [place]: [place]  Urinary Retention  Level 5 caveat: Dementia HISTORY OF PRESENT ILLNESS  11/25/17 10:51 PM Peter Singh is a 84 y.o. male who self catheterizes every 4-6 hours due to chronic urinary retention.  When he was catheterized earlier this evening his nursing home staff was unable to get any urine output.  He was sent to the ED for suspected retention not amenable to self-catheterization.  He is having mild to moderate bladder pain when palpated but not at rest.  He has not had a fever.  He has dementia and his wife is the primary historian.  He also has a chronic maculopapular rash.   Past Medical History:  Diagnosis Date  . Atrial fibrillation ([place])   . Dementia   . Diverticulosis of colon (without mention of hemorrhage)   . Hyperlipemia   . Hypertension   . Melanoma ([place])   . Personal history of colonic polyps 1999 & 2004   adenomatous polyps  . Ventricular hypertrophy     Past Surgical History:  Procedure Laterality Date  . KNEE SURGERY     right  . PILONIDAL CYST DRAINAGE    . schwanoma tumor lumbar spine    . SPINE SURGERY     tumor removed  . TONSILLECTOMY AND ADENOIDECTOMY      Family History  Problem Relation Age of Onset  . Stroke Father   . Neuropathy Neg Hx     Social History   Tobacco Use  . Smoking status: Former Smoker    Packs/day: 3.00    Types: Cigarettes    Last attempt to quit: 06/09/1975    Years since quitting: 42.4  . Smokeless tobacco: Never Used  Substance Use Topics  . Alcohol use: Yes    Alcohol/week: 0.0 oz    Comment: 2 drinks per day  . Drug use: No    Prior to Admission medications   Medication Sig Start Date End Date Taking? Authorizing Provider  acetaminophen (TYLENOL) 500 MG tablet Take 500 mg by mouth 2 (two) times daily. Document area of discomfort and effectiveness of  medication.     [provider]  atorvastatin (LIPITOR) 20 MG tablet Take 20 mg by mouth every evening.     [provider]  donepezil (ARICEPT) 10 MG tablet Take 1 tablet (10 mg total) by mouth daily. 11/23/17   Melvenia Beam, MD  folic acid (FOLVITE) 1 MG tablet Take 1 tablet (1 mg total) by mouth daily. 03/14/16   Kinnie Feil, MD  gabapentin (NEURONTIN) 300 MG capsule Take 300 mg by mouth at bedtime as needed (pain).    [provider]  hydrochlorothiazide (MICROZIDE) 12.5 MG capsule Take 12.5 mg by mouth every other day.    [provider]  memantine (NAMENDA) 5 MG tablet 1 TABLET TWICE A DAY. 11/23/17   Melvenia Beam, MD  methenamine (MANDELAMINE) 1 g tablet Take 1,000 mg by mouth 2 (two) times daily.    [provider]  Tamsulosin HCl (FLOMAX) 0.4 MG CAPS Take 0.8 mg by mouth daily after supper.     [provider]  thiamine 100 MG tablet Take 1 tablet (100 mg total) by mouth daily. 03/14/16   Kinnie Feil, MD  vitamin C (ASCORBIC ACID) 500 MG tablet Take 500 mg by mouth 2 (two) times daily.    [provider]  warfarin (COUMADIN) 7.5 MG tablet TAKE 1 TABLET DAILY OR AS DIRECTED. 07/09/17   [name], MD    Allergies Penicillins   REVIEW OF SYSTEMS     PHYSICAL EXAMINATION  Initial Vital Signs Blood pressure (!) 143/80, pulse 86, temperature 98.5 F (36.9 C), temperature source Oral, resp. rate 20, SpO2 94 %.  Examination General: Well-developed, well-nourished male in no acute distress; appearance consistent with age of record HENT: normocephalic; atraumatic Eyes: pupils equal, round and reactive to light; extraocular muscles intact Neck: supple Heart: regular rate and rhythm Lungs: clear to auscultation bilaterally Abdomen: soft; nondistended; mild bladder tenderness; bowel sounds present GU: Tanner V male, uncircumcised; enlarged, nontender right testicle; prostate nontender; minimal retained  urine on bedside bladder scan Extremities: No deformity; full range of motion; pulses normal; trace edema of lower legs Neurologic: Awake, alert; motor function intact in all extremities and symmetric; no facial droop Skin: Warm and dry; generalized maculopapular rash Psychiatric: Normal mood and affect   RESULTS  Summary of this visit's results, reviewed by myself:   EKG Interpretation  Date/Time:    Ventricular Rate:    PR Interval:    QRS Duration:   QT Interval:    QTC Calculation:   R Axis:     Text Interpretation:        Laboratory Studies: Results for orders placed or performed during the hospital encounter of 11/25/17 (from the past 24 hour(s))  Urinalysis, Routine w reflex microscopic     Status: Abnormal   Collection Time: 11/25/17 11:05 PM  Result Value Ref Range   Color, Urine YELLOW YELLOW   APPearance CLOUDY (A) CLEAR   Specific Gravity, Urine 1.018 1.005 - 1.030   pH 5.0 5.0 - 8.0   Glucose, UA NEGATIVE NEGATIVE mg/dL   Hgb urine dipstick SMALL (A) NEGATIVE   Bilirubin Urine NEGATIVE NEGATIVE   Ketones, ur NEGATIVE NEGATIVE mg/dL   Protein, ur 100 (A) NEGATIVE mg/dL   Nitrite POSITIVE (A) NEGATIVE   Leukocytes, UA LARGE (A) NEGATIVE   RBC / HPF 21-50 0 - 5 RBC/hpf   WBC, UA >50 (H) 0 - 5 WBC/hpf   Bacteria, UA MANY (A) NONE SEEN   Squamous Epithelial / LPF 0-5 0 - 5   WBC Clumps PRESENT    Imaging Studies: No results found.  ED COURSE and MDM  Nursing notes and initial vitals signs, including pulse oximetry, reviewed.  Vitals:   11/25/17 2315  BP: 121/84  Pulse: 67  Resp: 18  Temp: 98.5 F (36.9 C)  TempSrc: Oral  SpO2: 96%   Urinalysis consistent with urinary tract infection.  Bladder scan shows the patient is not retaining urine and I do not believe a Foley catheter is indicated at this time.  We will place him on antibiotics.  Urine sent for culture.  PROCEDURES    ED DIAGNOSES     ICD-10-CM   1. Lower urinary tract infectious  disease N39.0        Rei Contee, MD 11/25/17 2343    Shanon Rosser, MD 11/26/17 0140

## 2017-11-25 NOTE — ED Triage Notes (Signed)
Pt from [place] and the last time they attempted to cath him he didn't have any urinary output Pt denies any pain

## 2017-11-26 DIAGNOSIS — N3000 Acute cystitis without hematuria: Secondary | ICD-10-CM | POA: Diagnosis not present

## 2017-11-26 DIAGNOSIS — N390 Urinary tract infection, site not specified: Secondary | ICD-10-CM | POA: Diagnosis not present

## 2017-11-28 LAB — URINE CULTURE

## 2017-11-29 ENCOUNTER — Telehealth: Payer: Self-pay

## 2017-11-29 NOTE — Telephone Encounter (Signed)
Post ED Visit - Positive Culture Follow-up  Culture report reviewed by antimicrobial stewardship pharmacist:  []  Elenor Quinones, Pharm.D. []  Heide Guile, Pharm.D., BCPS AQ-ID []  Parks Neptune, Pharm.D., BCPS []  Alycia Rossetti, Pharm.D., BCPS []  [place], Pharm.D., BCPS, AAHIVP []  Legrand Como, Pharm.D., BCPS, AAHIVP []  Salome Arnt, PharmD, BCPS []  Wynell Balloon, PharmD []  Vincenza Hews, PharmD, BCPS [facility] Pharm D Positive urine culture Treated with Cephalexin, organism sensitive to the same and no further patient follow-up is required at this time.  Genia Del 11/29/2017, 11:04 AM

## 2017-12-03 ENCOUNTER — Telehealth: Payer: Self-pay | Admitting: Interventional Cardiology

## 2017-12-03 ENCOUNTER — Ambulatory Visit (INDEPENDENT_AMBULATORY_CARE_PROVIDER_SITE_OTHER): Payer: Medicare Other | Admitting: Cardiovascular Disease

## 2017-12-03 DIAGNOSIS — Z5181 Encounter for therapeutic drug level monitoring: Secondary | ICD-10-CM

## 2017-12-03 DIAGNOSIS — I4891 Unspecified atrial fibrillation: Secondary | ICD-10-CM | POA: Diagnosis not present

## 2017-12-03 LAB — PROTIME-INR: INR: 2.2 — AB (ref 0.9–1.1)

## 2017-12-03 NOTE — Telephone Encounter (Signed)
Spoke with [place] and pts spouse and orders faxed back. See anticoagulation encounter.

## 2017-12-03 NOTE — Telephone Encounter (Signed)
[place] with [facility] is calling in reference to INR Results. She advised INR is 2.2 and PT 24.2. Please call.

## 2017-12-07 MED FILL — SHINGRIX 50 MCG SUS: 50 | 1 days supply | Qty: 1 | Fill #1

## 2017-12-10 DIAGNOSIS — R3914 Feeling of incomplete bladder emptying: Secondary | ICD-10-CM | POA: Diagnosis not present

## 2017-12-10 DIAGNOSIS — C641 Malignant neoplasm of right kidney, except renal pelvis: Secondary | ICD-10-CM | POA: Diagnosis not present

## 2017-12-10 DIAGNOSIS — N3021 Other chronic cystitis with hematuria: Secondary | ICD-10-CM | POA: Diagnosis not present

## 2017-12-11 DIAGNOSIS — Z6832 Body mass index (BMI) 32.0-32.9, adult: Secondary | ICD-10-CM | POA: Diagnosis not present

## 2017-12-11 DIAGNOSIS — G3184 Mild cognitive impairment, so stated: Secondary | ICD-10-CM | POA: Diagnosis not present

## 2017-12-11 DIAGNOSIS — G6289 Other specified polyneuropathies: Secondary | ICD-10-CM | POA: Diagnosis not present

## 2017-12-11 DIAGNOSIS — I6990 Unspecified sequelae of unspecified cerebrovascular disease: Secondary | ICD-10-CM | POA: Diagnosis not present

## 2017-12-11 DIAGNOSIS — R7309 Other abnormal glucose: Secondary | ICD-10-CM | POA: Diagnosis not present

## 2017-12-11 DIAGNOSIS — E668 Other obesity: Secondary | ICD-10-CM | POA: Diagnosis not present

## 2017-12-11 DIAGNOSIS — R627 Adult failure to thrive: Secondary | ICD-10-CM | POA: Diagnosis not present

## 2017-12-11 DIAGNOSIS — Z7901 Long term (current) use of anticoagulants: Secondary | ICD-10-CM | POA: Diagnosis not present

## 2017-12-11 DIAGNOSIS — I2721 Secondary pulmonary arterial hypertension: Secondary | ICD-10-CM | POA: Diagnosis not present

## 2017-12-11 DIAGNOSIS — N183 Chronic kidney disease, stage 3 (moderate): Secondary | ICD-10-CM | POA: Diagnosis not present

## 2017-12-11 DIAGNOSIS — I480 Paroxysmal atrial fibrillation: Secondary | ICD-10-CM | POA: Diagnosis not present

## 2017-12-11 DIAGNOSIS — D696 Thrombocytopenia, unspecified: Secondary | ICD-10-CM | POA: Diagnosis not present

## 2017-12-16 ENCOUNTER — Other Ambulatory Visit: Payer: Self-pay | Admitting: Interventional Cardiology

## 2017-12-18 DIAGNOSIS — N3000 Acute cystitis without hematuria: Secondary | ICD-10-CM | POA: Diagnosis not present

## 2017-12-31 DIAGNOSIS — Z5181 Encounter for therapeutic drug level monitoring: Secondary | ICD-10-CM | POA: Diagnosis not present

## 2017-12-31 DIAGNOSIS — I4891 Unspecified atrial fibrillation: Secondary | ICD-10-CM | POA: Diagnosis not present

## 2017-12-31 LAB — PROTIME-INR: INR: 2.5 — AB (ref 0.9–1.1)

## 2018-01-01 ENCOUNTER — Ambulatory Visit (INDEPENDENT_AMBULATORY_CARE_PROVIDER_SITE_OTHER): Payer: Medicare Other | Admitting: Internal Medicine

## 2018-01-01 DIAGNOSIS — I4891 Unspecified atrial fibrillation: Secondary | ICD-10-CM

## 2018-01-01 DIAGNOSIS — Z5181 Encounter for therapeutic drug level monitoring: Secondary | ICD-10-CM

## 2018-02-11 ENCOUNTER — Ambulatory Visit (INDEPENDENT_AMBULATORY_CARE_PROVIDER_SITE_OTHER): Payer: Medicare Other | Admitting: Cardiovascular Disease

## 2018-02-11 DIAGNOSIS — I4891 Unspecified atrial fibrillation: Secondary | ICD-10-CM

## 2018-02-11 DIAGNOSIS — Z5181 Encounter for therapeutic drug level monitoring: Secondary | ICD-10-CM | POA: Diagnosis not present

## 2018-02-11 LAB — PROTIME-INR: INR: 2.6 — AB (ref 0.9–1.1)

## 2018-02-23 DIAGNOSIS — N3021 Other chronic cystitis with hematuria: Secondary | ICD-10-CM | POA: Diagnosis not present

## 2018-02-23 DIAGNOSIS — R3914 Feeling of incomplete bladder emptying: Secondary | ICD-10-CM | POA: Diagnosis not present

## 2018-02-23 DIAGNOSIS — N401 Enlarged prostate with lower urinary tract symptoms: Secondary | ICD-10-CM | POA: Diagnosis not present

## 2018-03-10 ENCOUNTER — Telehealth: Payer: Self-pay | Admitting: *Deleted

## 2018-03-10 NOTE — Telephone Encounter (Signed)
Patient's wife called and stated the pt is having some dental work tomorrow and he needs an INR checked. The wife asked if we could send an order to [facility] to have INR done at their facility. Spoke with Katharine Look at [facility] and she stated to send an order and she would obtain the INR and send to the dental office.  At 4pm, [facility] faxed over the INR results which was 2.1 and patient will continue same doseage of Coumadin which is a 7.5mg  tablet and take 1 tablet daily except 1/2 tablet on Mondays, Wednesdays and Fridays. Will keep pt's recheck date of 03/25/18.

## 2018-03-15 DIAGNOSIS — C4402 Squamous cell carcinoma of skin of lip: Secondary | ICD-10-CM | POA: Diagnosis not present

## 2018-03-15 DIAGNOSIS — L570 Actinic keratosis: Secondary | ICD-10-CM | POA: Diagnosis not present

## 2018-03-15 DIAGNOSIS — Z85820 Personal history of malignant melanoma of skin: Secondary | ICD-10-CM | POA: Diagnosis not present

## 2018-03-15 DIAGNOSIS — L111 Transient acantholytic dermatosis [Grover]: Secondary | ICD-10-CM | POA: Diagnosis not present

## 2018-03-15 DIAGNOSIS — Z85828 Personal history of other malignant neoplasm of skin: Secondary | ICD-10-CM | POA: Diagnosis not present

## 2018-03-15 DIAGNOSIS — D1801 Hemangioma of skin and subcutaneous tissue: Secondary | ICD-10-CM | POA: Diagnosis not present

## 2018-03-15 DIAGNOSIS — L821 Other seborrheic keratosis: Secondary | ICD-10-CM | POA: Diagnosis not present

## 2018-03-23 ENCOUNTER — Inpatient Hospital Stay: Payer: Medicare Other | Attending: Oncology | Admitting: Oncology

## 2018-03-23 ENCOUNTER — Inpatient Hospital Stay: Payer: Medicare Other

## 2018-03-23 VITALS — BP 131/57 | HR 65 | Temp 97.8°F | Resp 17 | Ht 79.0 in | Wt 283.1 lb

## 2018-03-23 DIAGNOSIS — D696 Thrombocytopenia, unspecified: Secondary | ICD-10-CM

## 2018-03-23 DIAGNOSIS — D72819 Decreased white blood cell count, unspecified: Secondary | ICD-10-CM | POA: Insufficient documentation

## 2018-03-23 LAB — CBC WITH DIFFERENTIAL (CANCER CENTER ONLY)
Basophils Absolute: 0 10*3/uL (ref 0.0–0.1)
Basophils Relative: 1 %
Eosinophils Absolute: 0 10*3/uL (ref 0.0–0.5)
Eosinophils Relative: 0 %
HEMATOCRIT: 46.7 % (ref 38.4–49.9)
Hemoglobin: 15.7 g/dL (ref 13.0–17.1)
LYMPHS ABS: 0.7 10*3/uL — AB (ref 0.9–3.3)
LYMPHS PCT: 29 %
MCH: 31.1 pg (ref 27.2–33.4)
MCHC: 33.5 g/dL (ref 32.0–36.0)
MCV: 92.8 fL (ref 79.3–98.0)
MONO ABS: 0.2 10*3/uL (ref 0.1–0.9)
MONOS PCT: 11 %
Neutro Abs: 1.4 10*3/uL — ABNORMAL LOW (ref 1.5–6.5)
Neutrophils Relative %: 59 %
Platelet Count: 74 10*3/uL — ABNORMAL LOW (ref 140–400)
RBC: 5.04 MIL/uL (ref 4.20–5.82)
RDW: 14.5 % (ref 11.0–14.6)
WBC Count: 2.3 10*3/uL — ABNORMAL LOW (ref 4.0–10.3)

## 2018-03-23 NOTE — Progress Notes (Signed)
Hematology and Oncology Follow Up Visit  Peter Singh [id number] 25-Oct-1931 84 y.o. 03/23/2018 7:54 AM Peter Singh, MDRus[place], Peter Reichmann, MD   Principle Diagnosis: 84 year old man with thrombocytopenia diagnosed in 2017.  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   Alcohol consumption as well as liver disease could also be contributing.  Current therapy: Active surveillance   Interim History:  Peter Singh is here for a follow up.  Since her last visit, he reports no major changes in his health.  He continues to be ambulatory with the help of a walker without any falls or syncope.  He denies any active bleeding at this time including no epistaxis, hematochezia or melena.  He continues to be on warfarin without any other bleeding complications.  He does not report any headaches, blurry vision, syncope or seizures.  He denies any dizziness or alteration in mental status.  He does not report any fevers, chills, sweats or weight loss. He does not report any chest pain, palpitation, orthopnea or leg edema. He does not report any cough, wheezing or hemoptysis. He does not report any nausea, vomiting or abdominal pain. He does not report any changes in bowel habbits. He does not report any frequency urgency or hesitancy.  He does not report any skin rashes or lymphadenopathy.  He does not report any bone pain or fractures.  Remaining review of systems is negative.   Medications: I have reviewed the patient's current medications.  Current Outpatient Medications  Medication Sig Dispense Refill  . acetaminophen (TYLENOL) 500 MG tablet Take 500 mg by mouth 2 (two) times daily. Document area of discomfort and effectiveness of medication.     Marland Kitchen atorvastatin (LIPITOR) 20 MG tablet Take 20 mg by mouth every evening.     . cephALEXin (KEFLEX) 500 MG capsule Take 1 capsule (500 mg total) by mouth 2 (two) times daily. 14 capsule 0  . donepezil (ARICEPT) 10 MG tablet Take  1 tablet (10 mg total) by mouth daily. 90 tablet 4  . folic acid (FOLVITE) 1 MG tablet Take 1 tablet (1 mg total) by mouth daily.    Marland Kitchen gabapentin (NEURONTIN) 300 MG capsule Take 300 mg by mouth at bedtime as needed (pain).    . hydrochlorothiazide (MICROZIDE) 12.5 MG capsule Take 12.5 mg by mouth every other day.    . memantine (NAMENDA) 5 MG tablet 1 TABLET TWICE A DAY. 180 tablet 4  . methenamine (MANDELAMINE) 1 g tablet Take 1,000 mg by mouth 2 (two) times daily.    . Tamsulosin HCl (FLOMAX) 0.4 MG CAPS Take 0.8 mg by mouth daily after supper.     . thiamine 100 MG tablet Take 1 tablet (100 mg total) by mouth daily.    . vitamin C (ASCORBIC ACID) 500 MG tablet Take 500 mg by mouth 2 (two) times daily.    Marland Kitchen warfarin (COUMADIN) 7.5 MG tablet TAKE 1 TABLET DAILY OR AS DIRECTED. 30 tablet 3   No current facility-administered medications for this visit.      Allergies:  Allergies  Allergen Reactions  . Penicillins Rash    Has patient had a PCN reaction causing immediate rash, facial/tongue/throat swelling, SOB or lightheadedness with hypotension: unknown Has patient had a PCN reaction causing severe rash involving mucus membranes or skin necrosis: unknown Has patient had a PCN reaction that required hospitalization : unknown Has patient had a PCN reaction occurring within the last 10 years: no If all of the above answers are "  NO", then may proceed with Cephalosporin use.     Past Medical History, Surgical history, Social history, and Family History unchanged from previous examination.    Physical Exam:   Blood pressure (!) 131/57, pulse 65, temperature 97.8 F (36.6 C), temperature source Oral, resp. rate 17, height _0  (2.007 m), weight 283 lb 1.6 oz (128.4 kg), SpO2 96 %.   ECOG: 1   General appearance: Comfortable appearing without any discomfort Head: Normocephalic without any trauma Oropharynx: Mucous membranes are moist and pink without any thrush or ulcers. Eyes:  Pupils are equal and round reactive to light. Lymph nodes: No cervical, supraclavicular, inguinal or axillary lymphadenopathy.   Heart:regular rate and rhythm.  S1 and S2 without leg edema. Lung: Clear without any rhonchi or wheezes.  No dullness to percussion. Abdomin: Soft, nontender, nondistended with good bowel sounds.  No hepatosplenomegaly. Musculoskeletal: No joint deformity or effusion.  Full range of motion noted. Neurological: No deficits noted on motor, sensory and deep tendon reflex exam. Skin: No petechial rash or dryness.  No ecchymosis noted   Lab Results: Lab Results  Component Value Date   WBC 2.5 (L) 09/23/2017   HGB 16.5 09/23/2017   HCT 49.4 09/23/2017   MCV 93.9 09/23/2017   PLT 79 (L) 09/23/2017     Chemistry      Component Value Date/Time   NA 141 09/23/2017 0809   NA 145 10/13/2016 0300   NA 144 09/17/2016 0742   K 4.2 09/23/2017 0809   K 4.1 09/17/2016 0742   CL 106 09/23/2017 0809   CO2 27 09/23/2017 0809   CO2 30 (H) 09/17/2016 0742   BUN 16 09/23/2017 0809   BUN 17 10/13/2016 0300   BUN 18.1 09/17/2016 0742   CREATININE 1.20 09/23/2017 0809   CREATININE 1.2 09/17/2016 0742   GLU 111 10/13/2016 0300      Component Value Date/Time   CALCIUM 10.6 (H) 09/23/2017 0809   CALCIUM 10.4 09/17/2016 0742   ALKPHOS 66 09/23/2017 0809   ALKPHOS 77 09/17/2016 0742   AST 20 09/23/2017 0809   AST 22 09/17/2016 0742   ALT 18 09/23/2017 0809   ALT 21 09/17/2016 0742   BILITOT 1.2 09/23/2017 0809   BILITOT 1.38 (H) 09/17/2016 0742       Impression and Plan:  84 year old man with:   1. Thrombocytopenia diagnosed 2017.  Counts have fluctuated between 60 to 90 over this period of time without bleeding.  His CBC was reviewed today and continues to show stable platelet count.  The differential diagnosis was reviewed again with the patient as well as treatment options.  His differential diagnosis include immune thrombocytopenia, myelodysplastic syndrome,  and alcohol consumption.  He does not have documented cirrhosis of the liver and does not have splenomegaly.  For management options, bone marrow biopsy would be necessary to diagnose myelodysplastic syndrome but would not change any management at this time.  His platelet count has been stable and no active bleeding is noted.  I recommended observation and surveillance for the time being.  I counseled him about the risk of bleeding associated with lower platelet count and warfarin.    2. Leukocytopenia: His absolute neutrophil count continues to be adequate.  This could be related to alcohol consumption or early myelodysplasia.  We will continue to monitor at this time.  3. Follow-up: Will be in 12 months.   15  minutes was spent with the patient face-to-face today.  More than 50% of time was  dedicated to reviewing the natural course of this disease, treatment options and complications associated with it.      Zola Button, MD 9/17/20197:54 AM

## 2018-03-25 DIAGNOSIS — I4891 Unspecified atrial fibrillation: Secondary | ICD-10-CM | POA: Diagnosis not present

## 2018-03-25 DIAGNOSIS — Z5181 Encounter for therapeutic drug level monitoring: Secondary | ICD-10-CM | POA: Diagnosis not present

## 2018-03-25 LAB — PROTIME-INR: INR: 2.4 — AB (ref 0.9–1.1)

## 2018-03-26 ENCOUNTER — Ambulatory Visit (INDEPENDENT_AMBULATORY_CARE_PROVIDER_SITE_OTHER): Payer: Medicare Other | Admitting: Interventional Cardiology

## 2018-03-26 DIAGNOSIS — Z5181 Encounter for therapeutic drug level monitoring: Secondary | ICD-10-CM

## 2018-03-26 DIAGNOSIS — I4891 Unspecified atrial fibrillation: Secondary | ICD-10-CM

## 2018-04-15 ENCOUNTER — Telehealth: Payer: Self-pay | Admitting: *Deleted

## 2018-04-15 DIAGNOSIS — C4402 Squamous cell carcinoma of skin of lip: Secondary | ICD-10-CM | POA: Diagnosis not present

## 2018-04-15 DIAGNOSIS — Z85828 Personal history of other malignant neoplasm of skin: Secondary | ICD-10-CM | POA: Diagnosis not present

## 2018-04-15 DIAGNOSIS — Z85820 Personal history of malignant melanoma of skin: Secondary | ICD-10-CM | POA: Diagnosis not present

## 2018-04-15 NOTE — Telephone Encounter (Signed)
Pt's wife left a msg on the voicemail and stated the pt a MOHS procedure done and he will start an antibiotic called doxycycline 100mg  tonight twice a day for 5 days. The med interacts with Coumadin therefore, pt will need his INR rechecked sooner. Pt will start the med tonight and sent an order to have INR checked on Monday. Spoke with Mliss Sax RN at [facility] and she stated she would call the lab to se if the could come out on Monday, if not they will be able to come on Tuesday.

## 2018-04-19 ENCOUNTER — Ambulatory Visit (INDEPENDENT_AMBULATORY_CARE_PROVIDER_SITE_OTHER): Payer: Medicare Other

## 2018-04-19 DIAGNOSIS — Z5181 Encounter for therapeutic drug level monitoring: Secondary | ICD-10-CM | POA: Diagnosis not present

## 2018-04-19 DIAGNOSIS — I4891 Unspecified atrial fibrillation: Secondary | ICD-10-CM

## 2018-04-19 LAB — PROTIME-INR: INR: 2.4 — AB (ref 0.9–1.1)

## 2018-04-19 NOTE — Patient Instructions (Signed)
Description   Spoke with pt's wife, advised to  continue same dose 1 tablet daily except 1/2 tablet on Mondays, Wednesdays and Fridays.  Order faxed back to [facility] to check INR in 6 weeks and fax results to [facility] at [phone]. Order faxed to [facility] [phone].

## 2018-04-22 DIAGNOSIS — Z4802 Encounter for removal of sutures: Secondary | ICD-10-CM | POA: Diagnosis not present

## 2018-04-30 DIAGNOSIS — Z23 Encounter for immunization: Secondary | ICD-10-CM | POA: Diagnosis not present

## 2018-05-18 ENCOUNTER — Other Ambulatory Visit: Payer: Self-pay | Admitting: Interventional Cardiology

## 2018-05-24 DIAGNOSIS — R7309 Other abnormal glucose: Secondary | ICD-10-CM | POA: Diagnosis not present

## 2018-05-24 DIAGNOSIS — E7849 Other hyperlipidemia: Secondary | ICD-10-CM | POA: Diagnosis not present

## 2018-05-24 DIAGNOSIS — Z125 Encounter for screening for malignant neoplasm of prostate: Secondary | ICD-10-CM | POA: Diagnosis not present

## 2018-05-24 LAB — LIPID PANEL
Cholesterol: 137 (ref 0–200)
HDL: 31 — AB (ref 35–70)
LDL Cholesterol: 64
Triglycerides: 210 — AB (ref 40–160)

## 2018-05-25 DIAGNOSIS — R82998 Other abnormal findings in urine: Secondary | ICD-10-CM | POA: Diagnosis not present

## 2018-05-31 DIAGNOSIS — Z6832 Body mass index (BMI) 32.0-32.9, adult: Secondary | ICD-10-CM | POA: Diagnosis not present

## 2018-05-31 DIAGNOSIS — R627 Adult failure to thrive: Secondary | ICD-10-CM | POA: Diagnosis not present

## 2018-05-31 DIAGNOSIS — G3184 Mild cognitive impairment, so stated: Secondary | ICD-10-CM | POA: Diagnosis not present

## 2018-05-31 DIAGNOSIS — Z0000 Encounter for general adult medical examination without abnormal findings: Secondary | ICD-10-CM | POA: Diagnosis not present

## 2018-05-31 DIAGNOSIS — G629 Polyneuropathy, unspecified: Secondary | ICD-10-CM | POA: Diagnosis not present

## 2018-05-31 DIAGNOSIS — Z1389 Encounter for screening for other disorder: Secondary | ICD-10-CM | POA: Diagnosis not present

## 2018-05-31 DIAGNOSIS — I2721 Secondary pulmonary arterial hypertension: Secondary | ICD-10-CM | POA: Diagnosis not present

## 2018-05-31 DIAGNOSIS — I6990 Unspecified sequelae of unspecified cerebrovascular disease: Secondary | ICD-10-CM | POA: Diagnosis not present

## 2018-05-31 DIAGNOSIS — I480 Paroxysmal atrial fibrillation: Secondary | ICD-10-CM | POA: Diagnosis not present

## 2018-05-31 DIAGNOSIS — C439 Malignant melanoma of skin, unspecified: Secondary | ICD-10-CM | POA: Diagnosis not present

## 2018-05-31 DIAGNOSIS — D696 Thrombocytopenia, unspecified: Secondary | ICD-10-CM | POA: Diagnosis not present

## 2018-05-31 DIAGNOSIS — I739 Peripheral vascular disease, unspecified: Secondary | ICD-10-CM | POA: Diagnosis not present

## 2018-06-01 ENCOUNTER — Ambulatory Visit (INDEPENDENT_AMBULATORY_CARE_PROVIDER_SITE_OTHER): Payer: Medicare Other | Admitting: Interventional Cardiology

## 2018-06-01 DIAGNOSIS — Z5181 Encounter for therapeutic drug level monitoring: Secondary | ICD-10-CM

## 2018-06-01 DIAGNOSIS — I4891 Unspecified atrial fibrillation: Secondary | ICD-10-CM | POA: Diagnosis not present

## 2018-06-01 LAB — POCT INR: INR: 3.3 — AB (ref 2.0–3.0)

## 2018-06-01 NOTE — Patient Instructions (Signed)
Description   Spoke with pt's wife, advised to skip Coumadin today, then continue same dose 1 tablet daily except 1/2 tablet on Mondays, Wednesdays and Fridays.  Order faxed back to [facility] to check INR in 3 weeks and fax results to [facility] at [phone]. Order faxed to [facility] [phone].

## 2018-06-07 DIAGNOSIS — C641 Malignant neoplasm of right kidney, except renal pelvis: Secondary | ICD-10-CM | POA: Diagnosis not present

## 2018-06-08 ENCOUNTER — Encounter: Payer: Self-pay | Admitting: Interventional Cardiology

## 2018-06-08 ENCOUNTER — Ambulatory Visit (INDEPENDENT_AMBULATORY_CARE_PROVIDER_SITE_OTHER): Payer: Medicare Other | Admitting: Interventional Cardiology

## 2018-06-08 VITALS — BP 124/72 | HR 60 | Ht 79.0 in | Wt 278.0 lb

## 2018-06-08 DIAGNOSIS — I10 Essential (primary) hypertension: Secondary | ICD-10-CM

## 2018-06-08 DIAGNOSIS — I5032 Chronic diastolic (congestive) heart failure: Secondary | ICD-10-CM | POA: Insufficient documentation

## 2018-06-08 DIAGNOSIS — I4891 Unspecified atrial fibrillation: Secondary | ICD-10-CM

## 2018-06-08 DIAGNOSIS — Z7901 Long term (current) use of anticoagulants: Secondary | ICD-10-CM | POA: Diagnosis not present

## 2018-06-08 NOTE — Progress Notes (Signed)
Cardiology Office Note:    Date:  06/08/2018   ID:  Peter Singh, DOB 04-19-1932, MRN [id number]  PCP:  [name], MD  Cardiologist:  Sinclair Grooms, MD   Referring MD: [name], MD   Chief Complaint  Patient presents with  . Atrial Fibrillation  . Congestive Heart Failure    Diastolic    History of Present Illness:    Peter Singh is a 84 y.o. male with a hx of chronic atrial fibrillation, chronic anticoagulation therapy, essential hypertension, chronic stable diastolic heart failure.  Peter Singh is doing well.  He is accompanied by Peter Singh, his wife.  She is concerned that he has no get up and go.  He fatigues easily.  He has no spontaneous ambition to do physical activity.  He sleeps a lot during the day.  He does not snore loudly.  He states adequate rest after a nights sleep.  He denies decreasing activity because of concern for falling.  He has not had chest pain.  He feels no palpitations or disturbance in heart rhythm.  Lower extremity swelling and orthopnea are not current issues.  He did have lower extremity swelling at one point in the intermediate past.  He has not noticed blood in his urine or stool.  He has had no transient acute neurological events.  They are concerned about memory loss.  Past Medical History:  Diagnosis Date  . Atrial fibrillation ([place])   . Dementia ([place])   . Diverticulosis of colon (without mention of hemorrhage)   . Hyperlipemia   . Hypertension   . Melanoma ([place])   . Personal history of colonic polyps 1999 & 2004   adenomatous polyps  . Ventricular hypertrophy     Past Surgical History:  Procedure Laterality Date  . KNEE SURGERY     right  . PILONIDAL CYST DRAINAGE    . schwanoma tumor lumbar spine    . SPINE SURGERY     tumor removed  . TONSILLECTOMY AND ADENOIDECTOMY      Current Medications: Current Meds  Medication Sig  . acetaminophen (TYLENOL) 500 MG tablet Take 500 mg by mouth 2 (two) times daily. Document area  of discomfort and effectiveness of medication.   Marland Kitchen atorvastatin (LIPITOR) 20 MG tablet Take 20 mg by mouth every evening.   . cephALEXin (KEFLEX) 500 MG capsule Take 1 capsule (500 mg total) by mouth 2 (two) times daily.  Marland Kitchen donepezil (ARICEPT) 10 MG tablet Take 1 tablet (10 mg total) by mouth daily.  . folic acid (FOLVITE) 1 MG tablet Take 1 tablet (1 mg total) by mouth daily.  Marland Kitchen gabapentin (NEURONTIN) 300 MG capsule Take 300 mg by mouth at bedtime as needed (pain).  . hydrochlorothiazide (MICROZIDE) 12.5 MG capsule Take 12.5 mg by mouth every other day.  . memantine (NAMENDA) 5 MG tablet 1 TABLET TWICE A DAY.  . methenamine (MANDELAMINE) 1 g tablet Take 1,000 mg by mouth 2 (two) times daily.  . Tamsulosin HCl (FLOMAX) 0.4 MG CAPS Take 0.8 mg by mouth daily after supper.   . thiamine 100 MG tablet Take 1 tablet (100 mg total) by mouth daily.  . vitamin C (ASCORBIC ACID) 500 MG tablet Take 500 mg by mouth 2 (two) times daily.  Marland Kitchen warfarin (COUMADIN) 7.5 MG tablet TAKE 1 TABLET DAILY OR AS DIRECTED.     Allergies:   Penicillins   Social History   Socioeconomic History  . Marital status: Married    Spouse  name: Not on file  . Number of children: 2  . Years of education: Not on file  . Highest education level: Not on file  Occupational History  . Occupation: retired  Scientific laboratory technician  . Financial resource strain: Not on file  . Food insecurity:    Worry: Not on file    Inability: Not on file  . Transportation needs:    Medical: Not on file    Non-medical: Not on file  Tobacco Use  . Smoking status: Former Smoker    Packs/day: 3.00    Types: Cigarettes    Last attempt to quit: 06/09/1975    Years since quitting: 43.0  . Smokeless tobacco: Never Used  Substance and Sexual Activity  . Alcohol use: Yes    Alcohol/week: 0.0 standard drinks    Comment: 2 drinks per day  . Drug use: No  . Sexual activity: Not on file  Lifestyle  . Physical activity:    Days per week: Not on file     Minutes per session: Not on file  . Stress: Not on file  Relationships  . Social connections:    Talks on phone: Not on file    Gets together: Not on file    Attends religious service: Not on file    Active member of club or organization: Not on file    Attends meetings of clubs or organizations: Not on file    Relationship status: Not on file  Other Topics Concern  . Not on file  Social History Narrative   Patient is married with 2 children    Patient is retired    Patient lives at [place] History: The patient's family history includes Stroke in his father. There is no history of Neuropathy.  ROS:   Please see the history of present illness.    Decrease memory, decreased energy, excessive daytime sleeping, otherwise no complaints.  All other systems reviewed and are negative.  EKGs/Labs/Other Studies Reviewed:    The following studies were reviewed today:  Echocardiography 2016: Study Conclusions  - Left ventricle: The cavity size was normal. There was moderate concentric hypertrophy. Systolic function was normal. The estimated ejection fraction was in the range of 55% to 60%. Wall motion was normal; there were no regional wall motion abnormalities. - Left atrium: The atrium was severely dilated. - Right ventricle: The cavity size was mildly dilated. - Right atrium: The atrium was moderately to severely dilated. - Atrial septum: No defect or patent foramen ovale was identified. - Tricuspid valve: There was mild-moderate regurgitation. There was evidence of perivalvular regurgitation. - Pulmonary arteries: PA peak pressure: 32 mm Hg (S).  EKG:  EKG is performed today and demonstrates atrial fibrillation, relatively slow ventricular response of 60 bpm, poor R wave progression, inferior Q waves.  When compared to the prior most recent tracing from April 2018, the rate is currently slower.  Recent Labs: 09/23/2017: ALT 18; BUN 16; Creatinine, Ser  1.20; Potassium 4.2; Sodium 141 03/23/2018: Hemoglobin 15.7; Platelet Count 74  Recent Lipid Panel No results found for: CHOL, TRIG, HDL, CHOLHDL, VLDL, LDLCALC, LDLDIRECT  Physical Exam:    VS:  BP 124/72   Pulse 60   Ht 6\' 7"  (2.007 m)   Wt 278 lb (126.1 kg)   SpO2 98%   BMI 31.32 kg/m     Wt Readings from Last 3 Encounters:  06/08/18 278 lb (126.1 kg)  03/23/18 283 lb 1.6 oz (128.4 kg)  11/23/17 284 lb (128.8 kg)     GEN: Tall/overweight/and appropriate appearance for age.. No acute distress HEENT: Normal NECK: No JVD. LYMPHATICS: No lymphadenopathy CARDIAC: Irregularly irregular, relatively slow RR.  Soft scratchy systolic murmur.  There is no gallop or edema. VASCULAR: Pulses are 2+ and symmetric and carotid, radial, and popliteal locations., Bruits are not present in the carotid arteries. RESPIRATORY:  Clear to auscultation without rales, wheezing or rhonchi  ABDOMEN: Soft, non-tender, non-distended, No pulsatile mass, MUSCULOSKELETAL: No deformity  SKIN: Warm and dry NEUROLOGIC:  Alert and oriented x 3 PSYCHIATRIC:  Normal affect   ASSESSMENT:    1. Atrial fibrillation, unspecified type ([place])   2. Essential hypertension   3. Chronic anticoagulation   4. Chronic diastolic heart failure ([facility])    PLAN:    In order of problems listed above:  1. Controlled ventricular rate.  Not currently on therapy to slow heart rate.  On anticoagulation therapy because of a CHADS Vasc score greater than 2. 2. Blood pressure is under excellent control on no specific therapy.  Target 130/80 mmHg. 3. Chronic Coumadin anticoagulation therapy without bleeding complications.  Screen stool for bright red blood or dark tarry bowel movements. 4. No clinical evidence of volume overload.  Cardiovascular status seems clinically stable currently.  Slow ventricular response is not a significant concern presently.  Decreased energy could be related to sleep disorder.       Medication  Adjustments/Labs and Tests Ordered: Current medicines are reviewed at length with the patient today.  Concerns regarding medicines are outlined above.  Orders Placed This Encounter  Procedures  . EKG 12-Lead   No orders of the defined types were placed in this encounter.   Patient Instructions  Medication Instructions:  Your physician recommends that you continue on your current medications as directed. Please refer to the Current Medication list given to you today.  If you need a refill on your cardiac medications before your next appointment, please call your pharmacy.   Lab work: None If you have labs (blood work) drawn today and your tests are completely normal, you will receive your results only by: Marland Kitchen MyChart Message (if you have MyChart) OR . A paper copy in the mail If you have any lab test that is abnormal or we need to change your treatment, we will call you to review the results.  Testing/Procedures: None  Follow-Up: At [facility], you and your health needs are our priority.  As part of our continuing mission to provide you with exceptional heart care, we have created designated Provider Care Teams.  These Care Teams include your primary Cardiologist (physician) and Advanced Practice Providers (APPs -  Physician Assistants and Nurse Practitioners) who all work together to provide you with the care you need, when you need it. You will need a follow up appointment in 12 months.  Please call our office 2 months in advance to schedule this appointment.  You may see Sinclair Grooms, MD or one of the following Advanced Practice Providers on your designated Care Team:   Truitt Merle, NP Cecilie Kicks, NP . Kathyrn Drown, NP  Any Other Special Instructions Will Be Listed Below (If Applicable).      Signed, Sinclair Grooms, MD  06/08/2018 2:40 PM    [facility]

## 2018-06-08 NOTE — Patient Instructions (Signed)

## 2018-06-10 DIAGNOSIS — C641 Malignant neoplasm of right kidney, except renal pelvis: Secondary | ICD-10-CM | POA: Diagnosis not present

## 2018-06-16 DIAGNOSIS — N401 Enlarged prostate with lower urinary tract symptoms: Secondary | ICD-10-CM | POA: Diagnosis not present

## 2018-06-16 DIAGNOSIS — R3914 Feeling of incomplete bladder emptying: Secondary | ICD-10-CM | POA: Diagnosis not present

## 2018-06-16 DIAGNOSIS — C641 Malignant neoplasm of right kidney, except renal pelvis: Secondary | ICD-10-CM | POA: Diagnosis not present

## 2018-06-18 DIAGNOSIS — Z85828 Personal history of other malignant neoplasm of skin: Secondary | ICD-10-CM | POA: Diagnosis not present

## 2018-06-18 DIAGNOSIS — Z85820 Personal history of malignant melanoma of skin: Secondary | ICD-10-CM | POA: Diagnosis not present

## 2018-06-18 DIAGNOSIS — D0439 Carcinoma in situ of skin of other parts of face: Secondary | ICD-10-CM | POA: Diagnosis not present

## 2018-06-18 DIAGNOSIS — C44329 Squamous cell carcinoma of skin of other parts of face: Secondary | ICD-10-CM | POA: Diagnosis not present

## 2018-06-22 DIAGNOSIS — Z7901 Long term (current) use of anticoagulants: Secondary | ICD-10-CM | POA: Diagnosis not present

## 2018-06-22 DIAGNOSIS — I4891 Unspecified atrial fibrillation: Secondary | ICD-10-CM | POA: Diagnosis not present

## 2018-06-22 LAB — PROTIME-INR: INR: 3.3 — AB (ref 0.9–1.1)

## 2018-06-23 ENCOUNTER — Ambulatory Visit (INDEPENDENT_AMBULATORY_CARE_PROVIDER_SITE_OTHER): Payer: Medicare Other | Admitting: Internal Medicine

## 2018-06-23 DIAGNOSIS — Z5181 Encounter for therapeutic drug level monitoring: Secondary | ICD-10-CM

## 2018-06-23 DIAGNOSIS — I4891 Unspecified atrial fibrillation: Secondary | ICD-10-CM | POA: Diagnosis not present

## 2018-07-02 DIAGNOSIS — Z85820 Personal history of malignant melanoma of skin: Secondary | ICD-10-CM | POA: Diagnosis not present

## 2018-07-02 DIAGNOSIS — Z85828 Personal history of other malignant neoplasm of skin: Secondary | ICD-10-CM | POA: Diagnosis not present

## 2018-07-02 DIAGNOSIS — C44329 Squamous cell carcinoma of skin of other parts of face: Secondary | ICD-10-CM | POA: Diagnosis not present

## 2018-07-05 DIAGNOSIS — H04221 Epiphora due to insufficient drainage, right lacrimal gland: Secondary | ICD-10-CM | POA: Diagnosis not present

## 2018-07-08 ENCOUNTER — Ambulatory Visit (INDEPENDENT_AMBULATORY_CARE_PROVIDER_SITE_OTHER): Payer: Medicare Other | Admitting: Internal Medicine

## 2018-07-08 DIAGNOSIS — I4891 Unspecified atrial fibrillation: Secondary | ICD-10-CM | POA: Diagnosis not present

## 2018-07-08 DIAGNOSIS — Z5181 Encounter for therapeutic drug level monitoring: Secondary | ICD-10-CM | POA: Diagnosis not present

## 2018-07-08 LAB — PROTIME-INR: INR: 2.7 — AB (ref 0.9–1.1)

## 2018-07-09 DIAGNOSIS — H04221 Epiphora due to insufficient drainage, right lacrimal gland: Secondary | ICD-10-CM | POA: Diagnosis not present

## 2018-07-09 NOTE — Patient Instructions (Signed)
Description   Spoke with pt's wife, advised to continue same dose 1 tablet daily except 1/2 tablet on Mondays, Wednesdays and Fridays.  Order faxed back to [facility] to check INR in 3 weeks and fax results to [facility] at [phone]. Order faxed to [facility] [phone].

## 2018-07-19 DIAGNOSIS — L821 Other seborrheic keratosis: Secondary | ICD-10-CM | POA: Diagnosis not present

## 2018-07-19 DIAGNOSIS — L570 Actinic keratosis: Secondary | ICD-10-CM | POA: Diagnosis not present

## 2018-07-19 DIAGNOSIS — C44329 Squamous cell carcinoma of skin of other parts of face: Secondary | ICD-10-CM | POA: Diagnosis not present

## 2018-07-19 DIAGNOSIS — Z85828 Personal history of other malignant neoplasm of skin: Secondary | ICD-10-CM | POA: Diagnosis not present

## 2018-07-19 DIAGNOSIS — L988 Other specified disorders of the skin and subcutaneous tissue: Secondary | ICD-10-CM | POA: Diagnosis not present

## 2018-07-19 DIAGNOSIS — D485 Neoplasm of uncertain behavior of skin: Secondary | ICD-10-CM | POA: Diagnosis not present

## 2018-07-19 DIAGNOSIS — Z85820 Personal history of malignant melanoma of skin: Secondary | ICD-10-CM | POA: Diagnosis not present

## 2018-07-19 DIAGNOSIS — C4441 Basal cell carcinoma of skin of scalp and neck: Secondary | ICD-10-CM | POA: Diagnosis not present

## 2018-07-29 ENCOUNTER — Ambulatory Visit (INDEPENDENT_AMBULATORY_CARE_PROVIDER_SITE_OTHER): Payer: Medicare Other | Admitting: Cardiology

## 2018-07-29 DIAGNOSIS — I4891 Unspecified atrial fibrillation: Secondary | ICD-10-CM | POA: Diagnosis not present

## 2018-07-29 DIAGNOSIS — Z5181 Encounter for therapeutic drug level monitoring: Secondary | ICD-10-CM

## 2018-07-29 LAB — POCT INR: INR: 3.4 — AB (ref 2.0–3.0)

## 2018-08-12 ENCOUNTER — Ambulatory Visit (INDEPENDENT_AMBULATORY_CARE_PROVIDER_SITE_OTHER): Payer: Medicare Other | Admitting: Pharmacist

## 2018-08-12 DIAGNOSIS — I4891 Unspecified atrial fibrillation: Secondary | ICD-10-CM

## 2018-08-12 DIAGNOSIS — Z5181 Encounter for therapeutic drug level monitoring: Secondary | ICD-10-CM | POA: Diagnosis not present

## 2018-08-12 DIAGNOSIS — Z7901 Long term (current) use of anticoagulants: Secondary | ICD-10-CM | POA: Diagnosis not present

## 2018-08-12 LAB — POCT INR: INR: 3.1 — AB (ref 2.0–3.0)

## 2018-08-26 ENCOUNTER — Ambulatory Visit (INDEPENDENT_AMBULATORY_CARE_PROVIDER_SITE_OTHER): Payer: Medicare Other | Admitting: Interventional Cardiology

## 2018-08-26 DIAGNOSIS — I4891 Unspecified atrial fibrillation: Secondary | ICD-10-CM | POA: Diagnosis not present

## 2018-08-26 DIAGNOSIS — Z5181 Encounter for therapeutic drug level monitoring: Secondary | ICD-10-CM | POA: Diagnosis not present

## 2018-08-26 LAB — PROTIME-INR: INR: 2.9 — AB (ref 0.9–1.1)

## 2018-08-30 DIAGNOSIS — C641 Malignant neoplasm of right kidney, except renal pelvis: Secondary | ICD-10-CM | POA: Diagnosis not present

## 2018-08-30 DIAGNOSIS — N401 Enlarged prostate with lower urinary tract symptoms: Secondary | ICD-10-CM | POA: Diagnosis not present

## 2018-08-30 DIAGNOSIS — K5900 Constipation, unspecified: Secondary | ICD-10-CM | POA: Diagnosis not present

## 2018-08-30 DIAGNOSIS — R3914 Feeling of incomplete bladder emptying: Secondary | ICD-10-CM | POA: Diagnosis not present

## 2018-09-16 ENCOUNTER — Ambulatory Visit (INDEPENDENT_AMBULATORY_CARE_PROVIDER_SITE_OTHER): Payer: Medicare Other | Admitting: Pharmacist

## 2018-09-16 DIAGNOSIS — I4891 Unspecified atrial fibrillation: Secondary | ICD-10-CM

## 2018-09-16 DIAGNOSIS — Z5181 Encounter for therapeutic drug level monitoring: Secondary | ICD-10-CM | POA: Diagnosis not present

## 2018-09-16 LAB — POCT INR: INR: 3.3 — AB (ref 2.0–3.0)

## 2018-10-07 ENCOUNTER — Ambulatory Visit (INDEPENDENT_AMBULATORY_CARE_PROVIDER_SITE_OTHER): Payer: Medicare Other | Admitting: Pharmacist

## 2018-10-07 DIAGNOSIS — Z5181 Encounter for therapeutic drug level monitoring: Secondary | ICD-10-CM

## 2018-10-07 DIAGNOSIS — Z7901 Long term (current) use of anticoagulants: Secondary | ICD-10-CM | POA: Diagnosis not present

## 2018-10-07 DIAGNOSIS — I4891 Unspecified atrial fibrillation: Secondary | ICD-10-CM

## 2018-10-07 LAB — POCT INR: INR: 2.6 (ref 2.0–3.0)

## 2018-10-19 ENCOUNTER — Other Ambulatory Visit: Payer: Self-pay | Admitting: Interventional Cardiology

## 2018-10-19 DIAGNOSIS — N390 Urinary tract infection, site not specified: Secondary | ICD-10-CM | POA: Diagnosis not present

## 2018-10-19 DIAGNOSIS — R3914 Feeling of incomplete bladder emptying: Secondary | ICD-10-CM | POA: Diagnosis not present

## 2018-11-02 ENCOUNTER — Other Ambulatory Visit: Payer: Self-pay | Admitting: Internal Medicine

## 2018-11-02 ENCOUNTER — Other Ambulatory Visit: Payer: Self-pay

## 2018-11-02 ENCOUNTER — Ambulatory Visit
Admission: RE | Admit: 2018-11-02 | Discharge: 2018-11-02 | Disposition: A | Discharge status: Home / Self Care | Payer: Medicare Other | Source: Ambulatory Visit | Attending: Internal Medicine | Admitting: Internal Medicine

## 2018-11-02 DIAGNOSIS — R197 Diarrhea, unspecified: Secondary | ICD-10-CM

## 2018-11-02 DIAGNOSIS — M5136 Other intervertebral disc degeneration, lumbar region: Secondary | ICD-10-CM | POA: Diagnosis not present

## 2018-11-02 DIAGNOSIS — K5900 Constipation, unspecified: Secondary | ICD-10-CM

## 2018-11-02 DIAGNOSIS — N2889 Other specified disorders of kidney and ureter: Secondary | ICD-10-CM | POA: Diagnosis not present

## 2018-11-04 ENCOUNTER — Ambulatory Visit (INDEPENDENT_AMBULATORY_CARE_PROVIDER_SITE_OTHER): Payer: Medicare Other | Admitting: Pharmacist

## 2018-11-04 DIAGNOSIS — Z5181 Encounter for therapeutic drug level monitoring: Secondary | ICD-10-CM

## 2018-11-04 DIAGNOSIS — I4891 Unspecified atrial fibrillation: Secondary | ICD-10-CM | POA: Diagnosis not present

## 2018-11-04 DIAGNOSIS — Z7901 Long term (current) use of anticoagulants: Secondary | ICD-10-CM | POA: Diagnosis not present

## 2018-11-04 LAB — PROTIME-INR: INR: 2 — AB (ref 0.9–1.1)

## 2018-11-10 ENCOUNTER — Telehealth: Payer: Self-pay | Admitting: Neurology

## 2018-11-10 NOTE — Telephone Encounter (Signed)
Pt's wife would like a call back to discuss the medications that her husband is on memantine (NAMENDA) 5 MG tablet and donepezil (ARICEPT) 10 MG tablet and their side effects. Please advise.

## 2018-11-11 ENCOUNTER — Encounter: Payer: Self-pay | Admitting: *Deleted

## 2018-11-11 ENCOUNTER — Other Ambulatory Visit: Payer: Self-pay

## 2018-11-11 ENCOUNTER — Telehealth: Payer: Self-pay | Admitting: Neurology

## 2018-11-11 ENCOUNTER — Ambulatory Visit (INDEPENDENT_AMBULATORY_CARE_PROVIDER_SITE_OTHER): Payer: Medicare Other | Admitting: Neurology

## 2018-11-11 DIAGNOSIS — R400 Somnolence: Secondary | ICD-10-CM

## 2018-11-11 DIAGNOSIS — F0150 Vascular dementia without behavioral disturbance: Secondary | ICD-10-CM

## 2018-11-11 DIAGNOSIS — G4710 Hypersomnia, unspecified: Secondary | ICD-10-CM

## 2018-11-11 DIAGNOSIS — G609 Hereditary and idiopathic neuropathy, unspecified: Secondary | ICD-10-CM | POA: Diagnosis not present

## 2018-11-11 DIAGNOSIS — R269 Unspecified abnormalities of gait and mobility: Secondary | ICD-10-CM | POA: Diagnosis not present

## 2018-11-11 DIAGNOSIS — F0390 Unspecified dementia without behavioral disturbance: Secondary | ICD-10-CM | POA: Diagnosis not present

## 2018-11-11 DIAGNOSIS — F03A0 Unspecified dementia, mild, without behavioral disturbance, psychotic disturbance, mood disturbance, and anxiety: Secondary | ICD-10-CM

## 2018-11-11 NOTE — Telephone Encounter (Signed)
Pts wife called in requesting a call back to discuss side effects

## 2018-11-11 NOTE — Progress Notes (Signed)
[facility]    Provider:  Dr Jaynee Singh Referring Provider: [name], MD Primary Care Physician:  Peter Baton, MD   CC: Cognitive changes, Excessive sleep, Neuropathy in the feet, dementia  Virtual Visit via Telephone Note  I connected with Peter Singh on 11/14/18 at  2:30 PM EDT by telephone and verified that I am speaking with the correct person using two identifiers.  Location: Patient: Home Provider: office   I discussed the limitations, risks, security and privacy concerns of performing an evaluation and management service by telephone and the availability of in person appointments. I also discussed with the patient that there may be a patient responsible charge related to this service. The patient expressed understanding and agreed to proceed.  Follow Up Instructions:    I discussed the assessment and treatment plan with the patient. The patient was provided an opportunity to ask questions and all were answered. The patient agreed with the plan and demonstrated an understanding of the instructions.   The patient was advised to call back or seek an in-person evaluation if the symptoms worsen or if the condition fails to improve as anticipated.  I provided 35 minutes of non-face-to-face time during this encounter.   Peter Beam, MD  Interval history 11/14/2018:  Calls himself Peter Singh, wife's name is Peter Singh. Absolutely lovely people. We follow Peter Singh for memory loss which is likely progressed to dementia, daytime hypersomnolence, neuropathy in the feet. Has had extensive lab testing, imaging, formal neurocognitive testing. Neuropathy: No etiology for neuropathy found except glucose intolerance and possibly alcohol use. He continues to be significantly tired during the day, has slept until 3pm in the past and they say they have been everywhere trying to figure it out. They declined a sleep test in the past and at this time they would like to have it  completed.   Interval history 11/2017: Calls himself Peter Singh, wife's name is Peter Singh. He is repeating more things in the same day, no driving, wife pays the bills, he doesn't play golf anymore, no interest in hobbies, they used to travel but physically that is harder, kids visit often. Did not remember wife's birthday or anniversary today. He has a lack of energy. More apathy in doing things. He is very tired, he may sleep until 3pm several days, no significant nodding off but he is excessively tired. He will lay down during the day often and nap. Discussed getting a sleep test but he doesn't snore and they declined. Very pleasant and friendly personality. They feel like they are in a good spot for their age, they are not interested in increasing medication or getting specialized imaging or formal memory testing. Numbness in the feet stable, he uses the walker. He does not drive. They decline sleep apnea test. They feel Stable at this time.   Interval history 11/05/2016: He is using a walker. Feels more steady. He continues to have short-term memory loss and repeats things more often. Here with his wife who provides more information. Discussed clinical trials and dementia, discussed mild cognitive impairment (he was diagnosed via neurocognitive testing with Peter Singh in 2016), and discussed other specialized imaging to help evaluate with memory changes and diagnosis as well as repeating formal neurocognitive testing. Patient and wife are not interested, especially since it will likely not change management. We'll start patient on Namenda. Also discussed increasing Aricept. Discussed mild cognitive impairment and that a third people will progress to dementia. Follow-up 1 year.  Interval history: He is  here with his wife. More short term memory loss. Wife is paying all the bills now. Wife is here and provides information. They saw Peter. Peter Singh last year. Discussed repeat testing if needed. He is not playing golf anymore.  No significant snoring. No excessive daytime somnolence, sometimes he naps. He hasn't exercised as much. He has numbness in his feet, getting number, getting worse. No pain just discomfort. Slowly progressive still with the memory but nothing significant.  He has worsening short term memory loss. Memory loss is progressive.He repeats things multiple times. 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   HPI: Peter Singh is a 85 y.o. male here as a referral from Peter. Peter Singh for memory loss. Having general loss of memory and forgetting names, asking the same thing over and over, forgetting appointments and names. Changes started several years ahgo, unsure when and are progressive. Wife accompanies and provides history. 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 It was benign. He is excessively tired tired during the day, he naps in the afternoons, some snoring at night. No witnessed apneic events. In his feet, he gets sharp pains in the feet. A few times he has gotten lost in the car. Was previously evaluated in my clinic for neuropathy, this is a new complaint. Patient has 2 drinks of bourbon a night, wife confirms. His recall of remote events appears intact, can recite the address of where he lived as achild. Appears to be more recent memories. He is still living independently with his wife and performs all his ADLs and IADLs. No other focal neurologic deficits. He has neuropathy. No FHx of neurodegenerative disease. He continues to have worsening numbness in the distal extremities.   Reviewed notes, labs and imaging from outside physicians, which showed: hgba1c 6.0, TSH wnl, B12 WNL, folate WNL, Other normal labs include: IFE, Pan-ANCA, ace, hiv, esr, ana, rpr, hepc ab, crp, paraneoplastic ab panel, RF, B1, ethanol (neg), mma. No etiology for neuropathy found except glucose intolerance and possibly alcohol use. EMG/NCS showed severe length-dependent axonal polyneuropathy.   Previous visit 07/04/2014 HPI:  Peter Singh is a 85 y.o. male here as a referral from Peter. Peter Singh for Neuropathy  Neuropathy started 4-5 years ago. No inciting factors, no medications that are know to cause neuropathy, no chemotherapy. He went to [facility] and they found a growth in his spinal column. He doesn't have any feeling in the tips of his fingers and in his feet, just numb. No tingling or burning. He is having fine motor skill difficulties. Dropping things. In all the fingers possibly in the whole hands. No cramping at night. Is the same all the time, continuous all day. It doesn't vary too much, not painful just numb. The changes go up to the ankles. Progressive. No burning just numbness. Balance is poor. He has to be careful to stop from falling. Stumbling. Has 2 drinks a day of bourbon. No chemotherapy, no long-term usage of medications that can cause neuropathy. 2 drinks of bourbon a day. No other focal neurologic symptoms. No FHx of neuropathy.   Reviewed notes, labs and imaging from outside physicians, which showed:   Ct showed No acute intracranial abnormalities including mass lesion or mass effect, hydrocephalus, extra-axial fluid collection, midline shift, hemorrhage, or acute infarction, large ischemic events (personally reviewed images), did show chronic mild global atrophy as well as chronic ischemic white matter non-specific changes. Last INR was 2.7.  Review of Systems: Patient complains of symptoms per HPI as well as  the following symptoms: numbness. Denies CP or SOB Pertinent negatives per HPI. All others negative.   Social History   Socioeconomic History   Marital status: Married    Spouse name: Not on file   Number of children: 2   Years of education: Not on file   Highest education level: Bachelor's degree (e.g., BA, AB, BS)  Occupational History   Occupation: retired  Scientist, product/process development strain: Not on file   Food insecurity:    Worry: Not on file    Inability: Not on  file   Transportation needs:    Medical: Not on file    Non-medical: Not on file  Tobacco Use   Smoking status: Former Smoker    Packs/day: 3.00    Types: Cigarettes    Last attempt to quit: 06/09/1975    Years since quitting: 43.4   Smokeless tobacco: Never Used  Substance and Sexual Activity   Alcohol use: Yes    Alcohol/week: 0.0 standard drinks    Comment: 2 drinks per day   Drug use: No   Sexual activity: Not on file  Lifestyle   Physical activity:    Days per week: Not on file    Minutes per session: Not on file   Stress: Not on file  Relationships   Social connections:    Talks on phone: Not on file    Gets together: Not on file    Attends religious service: Not on file    Active member of club or organization: Not on file    Attends meetings of clubs or organizations: Not on file    Relationship status: Not on file   Intimate partner violence:    Fear of current or ex partner: Not on file    Emotionally abused: Not on file    Physically abused: Not on file    Forced sexual activity: Not on file  Other Topics Concern   Not on file  Social History Narrative   Patient is married with 2 children    Patient is retired    Patient lives at [place] History  Problem Relation Age of Onset   Stroke Father    Neuropathy Neg Hx     Past Medical History:  Diagnosis Date   Atrial fibrillation ([place])    Dementia ([place])    Diverticulosis of colon (without mention of hemorrhage)    Hyperlipemia    Hypertension    Melanoma ([place])    Personal history of colonic polyps 1999 & 2004   adenomatous polyps   Ventricular hypertrophy     Past Surgical History:  Procedure Laterality Date   KNEE SURGERY     right   PILONIDAL CYST DRAINAGE     schwanoma tumor lumbar spine     SPINE SURGERY     tumor removed   TONSILLECTOMY AND ADENOIDECTOMY      Current Outpatient Medications  Medication Sig Dispense Refill   acetaminophen  (TYLENOL) 500 MG tablet Take 500 mg by mouth 2 (two) times daily. Document area of discomfort and effectiveness of medication.      atorvastatin (LIPITOR) 20 MG tablet Take 20 mg by mouth every evening.      donepezil (ARICEPT) 10 MG tablet Take 1 tablet (10 mg total) by mouth daily. (Patient not taking: Reported on 11/11/2018) 90 tablet 4   folic acid (FOLVITE) 1 MG tablet Take 1 tablet (1 mg total) by mouth daily.  gabapentin (NEURONTIN) 300 MG capsule Take 300 mg by mouth at bedtime as needed (pain).     hydrochlorothiazide (MICROZIDE) 12.5 MG capsule Take 12.5 mg by mouth every other day.     memantine (NAMENDA) 5 MG tablet 1 TABLET TWICE A DAY. (Patient not taking: Reported on 11/11/2018) 180 tablet 4   methenamine (MANDELAMINE) 1 g tablet Take 1,000 mg by mouth 2 (two) times daily.     thiamine 100 MG tablet Take 1 tablet (100 mg total) by mouth daily.     vitamin C (ASCORBIC ACID) 500 MG tablet Take 500 mg by mouth 2 (two) times daily.     warfarin (COUMADIN) 7.5 MG tablet TAKE 1 TABLET DAILY OR AS DIRECTED. (Patient taking differently: 1/2 tablet Mon, Wed, Fri and 1 tablet Tues, Thurs, Sat, Sun.) 30 tablet 3   No current facility-administered medications for this visit.     Allergies as of 11/11/2018 - Review Complete 11/11/2018  Allergen Reaction Noted   Penicillins Rash 04/08/2011    Vitals: There were no vitals taken for this visit. Last Weight:  Wt Readings from Last 1 Encounters:  06/08/18 278 lb (126.1 kg)   Last Height:   Ht Readings from Last 1 Encounters:  06/08/18 _0  (2.007 m)    PRIOR EXAM:    Physical exam: Exam: Gen: NAD, conversant, well nourised, obese, well groomed  CV: RRR, no MRG. No Carotid Bruits. Eyes: Conjunctivae clear without exudates or hemorrhage  Neuro: No changes Detailed Neurologic Exam  Speech:  Speech is normal; fluent and spontaneous with normal comprehension.  Cognition:  MMSE - Mini Mental  State Exam 11/23/2017 11/05/2016  Orientation to time 2 5  Orientation to Place 5 5  Registration 3 3  Attention/ Calculation 5 5  Recall 2 1  Language- name 2 objects 2 2  Language- repeat 1 1  Language- follow 3 step command 3 3  Language- read & follow direction 1 1  Write a sentence 1 1  Copy design 1 1  Total score 26 28   Montreal Cognitive Assessment  07/26/2014  Visuospatial/ Executive (0/5) 3  Naming (0/3) 3  Attention: Read list of digits (0/2) 2  Attention: Read list of letters (0/1) 1  Attention: Serial 7 subtraction starting at 100 (0/3) 3  Language: Repeat phrase (0/2) 2  Language : Fluency (0/1) 1  Abstraction (0/2) 2  Delayed Recall (0/5) 0  Orientation (0/6) 5  Total 22  Adjusted Score (based on education) 22     The patient is oriented to person, place, month/year;   recent memory impaired, remote memory intact;   language fluent; normal attention, concentration, fund of knowledge Cranial Nerves:  The pupils are equal, round, and reactive to light. Extraocular movements are intact. Trigeminal sensation is intact and the muscles of mastication are normal. The face is symmetric. The palate elevates in the midline. Hearing intact. Voice is normal. Shoulder shrug is normal. The tongue has normal motion without fasciculations.   Gait:  Bradykinetic with a walker   Motor Observation:  No asymmetry, no atrophy, and no involuntary movements noted. Tone:  Normal muscle tone.     Strength:  Strength is V/V in the upper and lower limbs.    Sensation:  (Stable) can't feel vibration in the toes, malleoli or knees. Can feel vibration in the fingers DIP.  Pp and temp decreased to elbows and above knees Proprioception absent in the great toes.  Reflex Exam:  DTR's:  Absent achilles  Assessment/Plan: 85 year old lovely male PMHx of afib on coumadin, HTN, HLD who is here for cognitive difficulties, hypersomnolence,  distal polyneuropathy.   - Significant hypersomnolence and fatigue. Wife says he has slept until 3pm. No depression, he is quite lovely and charming.  They have been "everywhere" and to "every doctor" without an etiology. They have declined a sleep evaluation in the past but are now in agreement they would like one. He may have a sleep disorder but fluctuations in awareness and daytime sleepiness can also be seen as dementia progresses especially lewy-body however patient's dementia has not been classified as of yet. Formal neurocog testing in 2016 suggested a vascular component.   - discussed with sleep team, they have an appointment with Peter. Brett Fairy for sleep eval Tuesday May 12.   - 10/2014: his neuropsychological profile would be consistent with Mild Cognitive Impairment. Taken together, his medical history and indications of subnormal executive functioning would suggest a vascular component to his cognitive decline. Appears to have progressed to dementia. Can consider repeating if warranted in the future.   - Repeat MRI of the brain due to progressive decline now with dementia.  - Stop Namenda for 2 weeks to see if his GI symptoms improve. Can also stop Aricept after that time to see if this helps.  Neuropathy: Stable. No etiology for neuropathy found except glucose intolerance and possibly alcohol use. EMG/NCS showed severe length-dependent axonal polyneuropathy. Reports 2 drinks of bourbon a night, discussed that 14 drinks a week is the recommended limit for men and alcohol can be directly toxic to nerves.   Orders Placed This Encounter  Procedures   MR BRAIN WO CONTRAST   Ambulatory referral to Sleep Studies     Sarina Ill, MD  [facility] [address] [place], [place] [address]  Phone [phone] Fax [phone]  A total of 25 minutes was spent face-to-face with this patient. Over half this time was spent on counseling patient on the  neuropathy, mci diagnosis and different diagnostic and therapeutic options available.

## 2018-11-11 NOTE — Telephone Encounter (Signed)
I spoke with the patient's wife on DPR and offered  Telephone visit with Dr. Jaynee Eagles as pt hasn't been seen in a year. The physical exam will be limited. The wife verbalized understanding and consented to a phone visit and for the visit to be filed with insurance. She stated they are on lockdown at [facility] right now due to the coronavirus. She confirmed pt's DOB and name and I updated his chart. No changes to PMH or surgeries. Medications updated. I scheduled pt's telephone visit for 2:30 pm today. She stated she will put the phone on speaker with her and pt for the visit. She understands they will get a call around 2:00 for check in followed by Dr. Cathren Laine call around 2:30 pm. She verbalized appreciation.

## 2018-11-11 NOTE — Telephone Encounter (Signed)
Called the patient to inform them that our office has placed new protocols in place for our office visits. Due to Covid 19 our office is reducing our number of office visits in order to minimize the risk to our patients and healthcare providers.Our office is now providing the capability to offer the patients virtual visits at this time. Informed of what that process looks like and informed that the Virtual visit will still be billed through insurance as such. Due to [place],informed the patient since the appointment is taking place over the phone/internet app, we can't guarantee the security of the phone line. With that said if we do move forward I would have to get verbal consent to complete the Video Visit/Phone call. Patient gave verbal consent to move forward with the video visit. I have reviewed the patient's chart and made sure that everything is up to date. Patient is also made aware that since this is a video visit we are able to complete the visit but a physical exam is not able to be done since the patient is not present in person. Pt request the link be texted/emailed to them. Pt informed that the front staff will contact the patient aprox 30 minutes prior to the scheduled appointment to "check them in" and make sure that everything is ready for the appointment to get started. Pt verbalized understanding of this information and will states to be ready for the visit at least 15-30 min prior to the visit. Reminded the patient once more that this is treated as a Office visit and the patient must be prepared for the visit and ready at the time of their appointment preferably in a well lit area where they have good connection for the visit. Pt verbalized understanding.  [email or web address]

## 2018-11-11 NOTE — Telephone Encounter (Signed)
Spoke with Dr. Jaynee Eagles. Pt will need a telephone visit.

## 2018-11-14 ENCOUNTER — Encounter: Payer: Self-pay | Admitting: Neurology

## 2018-11-15 ENCOUNTER — Telehealth: Payer: Self-pay | Admitting: Neurology

## 2018-11-15 NOTE — Telephone Encounter (Signed)
Medicare/aarp order sento [facility]. No auth they will reach out to the pt to schedule.

## 2018-11-16 ENCOUNTER — Encounter: Payer: Self-pay | Admitting: Neurology

## 2018-11-16 ENCOUNTER — Other Ambulatory Visit: Payer: Self-pay

## 2018-11-16 ENCOUNTER — Ambulatory Visit (INDEPENDENT_AMBULATORY_CARE_PROVIDER_SITE_OTHER): Payer: Medicare Other | Admitting: Neurology

## 2018-11-16 DIAGNOSIS — Z7901 Long term (current) use of anticoagulants: Secondary | ICD-10-CM | POA: Diagnosis not present

## 2018-11-16 DIAGNOSIS — D708 Other neutropenia: Secondary | ICD-10-CM

## 2018-11-16 DIAGNOSIS — G4719 Other hypersomnia: Secondary | ICD-10-CM | POA: Diagnosis not present

## 2018-11-16 DIAGNOSIS — G609 Hereditary and idiopathic neuropathy, unspecified: Secondary | ICD-10-CM | POA: Diagnosis not present

## 2018-11-16 DIAGNOSIS — I5032 Chronic diastolic (congestive) heart failure: Secondary | ICD-10-CM

## 2018-11-16 DIAGNOSIS — D696 Thrombocytopenia, unspecified: Secondary | ICD-10-CM | POA: Diagnosis not present

## 2018-11-16 DIAGNOSIS — F03A0 Unspecified dementia, mild, without behavioral disturbance, psychotic disturbance, mood disturbance, and anxiety: Secondary | ICD-10-CM

## 2018-11-16 DIAGNOSIS — I4891 Unspecified atrial fibrillation: Secondary | ICD-10-CM | POA: Diagnosis not present

## 2018-11-16 DIAGNOSIS — F0390 Unspecified dementia without behavioral disturbance: Secondary | ICD-10-CM

## 2018-11-16 NOTE — Progress Notes (Signed)
Virtual Visit via Telephone Note  I connected with Peter Singh on 11/16/18 at  3:00 PM EDT by telephone and verified that I am speaking with the correct person using two identifiers.  Location: Patient: at home with wife Provider:at [facility]    I discussed the limitations, risks, security and privacy concerns of performing an evaluation and management service by telephone and the availability of in person appointments. I also discussed with the patient that there may be a patient responsible charge related to this service. The patient expressed understanding and agreed to proceed.   History of Present Illness:  SLEEP MEDICINE CLINIC   Provider:  Larey Seat, M D  Primary Care Physician:  [name], MD   Referring Provider: Sarina Ill, MD  No chief complaint on file.   HPI:  Peter Singh is a 85 y.o. male patient with hypersomnia , referred by Dr. Sarina Ill.  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  Dr. Ardell Isaacs spoke to the patient by telephone on 14 Nov 2018.  The patient calls himself Peter Singh by his middle name his wife's name is Hydrographic surveyor after [employer].  There was no etiology for the neuropathy found for which she has seen Dr. Jaynee Eagles except that he has glucose intolerance and possibly some contribution from long-term alcohol use.  He has begun repeating words and questions within the same day even repeating actions.  He is no longer driving he has trouble paying the bills.  No longer balancing the checkbook.  No longer playing golf.  He has lost interest in some of the hobbies that used to bring him pleasure and he is now walking with a walker and has to self cath.  He had seen Dr. Valentina Shaggy in 2017 when memory loss was first  evident.  The patient's primary care physician, Dr. [name], had already reviewed his medications which include Aricept, Lipitor, Tylenol, folic acid, Neurontin 169 mg at night only, HCTZ at 12.5 mg at night, Namenda 5 mg, mesalamine, thiamine, vitamin C and Coumadin 7.5 mg on Tuesday Thursday Saturday and Sunday and 1/2 tablet on Monday Wednesday Friday.  Because of diarrhea and gastric intolerance Namenda had been discontinued and Aricept is scheduled to be discontinued soon.  The patient's Montreal cognitive assessment test in 2016 was already only 22 out of 30 points and his Mini-Mental status has slipped from 28 last May 2018 to  26 in may 2019 -.  It ihas likely declined further.  In order to evaluate him for his hypersomnia he had seen his cardiologist who stated that from the heart health standpoint there is no contribution to his sleepiness seen.  He has also seen his hematologist who also stated there was no problem that he could determine no anemia is present.  And Dr. Jaynee Eagles had finally suggested a sleep study.  In the past the couple had declined a sleep study partially because [name] goes to the bathroom frequently at night and they felt he would not get a representative night of sleep in the lab.  They also stated that his brain scan has been ordered but not yet done in 2020 and they wanted to know more about the opportunity of a home sleep test versus an in lab sleep study.  The couple also stated that there has never been a history of strokes in [name].      Medical history : see above    Social history: retired- married , his wife [name] is a Chiropodist by middle name, both met at [employer]. Both graduated.  He os non smoker, non caffeine drinker, 2 drinks of wine per day in he past.    Sleep habits are as follows: Dinnertime for the couple is around 7 PM and there is not much physical activity during the day or after dinner since the patient is using a walker and has to go and  self cath.  Bedtime for him is at 9 PM his wife may follow 90 minutes later.  He seems always to be promptly asleep once he enters the bedroom which is described as cool, quiet and dark.  He prefers to sleep on either side with one pillow.  He states that he may go to the bathroom twice to self cath and has no trouble to go back to sleep.  He cannot really recall his dreams but sometimes has asked his wife if something was reality or if he might have dreamt it.  She has not noticed any kind of dream enactment.  He reports that he feels refreshed after his nocturnal sleep which can be between 8 and 12 hours in duration he does not report any headaches or dry mouth when he wakes up.  He wakes up spontaneously around 7 AM but does not leave the bed until about 10 AM.  He does not take naps until after lunch which is usually by 1 PM and he goes back to sleep for another 3 hours.  Usually wakes up at about 430 his wife has not noted him to snore and has not noted any other breathing abnormalities or irregularities. Review of Systems: Out of a complete 14 system review, the patient complains of only the following symptoms, and all other reviewed systems are negative.   How likely are you to doze in the following situations: 0 = not likely, 1 = slight chance, 2 = moderate chance, 3 = high chance  Sitting and Reading? Watching Television? Sitting inactive in a public place (theater or meeting)? Lying down in the afternoon when circumstances permit? Sitting and talking to someone? Sitting quietly after lunch without alcohol? In a car, while stopped for a few minutes in traffic? As a passenger in a car for an hour without a break?  Total =7/ 24 - adjusted to 15/ 24  With naps of 3 hours daily and plus 10 hours average sleep time-    Social History   Socioeconomic History   Marital status: Married    Spouse name: Not on file   Number of children: 2   Years of education: Not on file   Highest  education level: Bachelor's degree (e.g., BA, AB, BS)  Occupational History   Occupation: retired  Scientist, product/process development strain: Not on file   Food insecurity:    Worry: Not on file    Inability: Not on file   Transportation needs:    Medical: Not on file    Non-medical: Not on file  Tobacco Use   Smoking status: Former Smoker    Packs/day: 3.00    Types: Cigarettes    Last attempt to quit: 06/09/1975    Years since quitting: 43.4   Smokeless tobacco: Never Used  Substance and Sexual Activity   Alcohol use:  Yes    Alcohol/week: 0.0 standard drinks    Comment: 2 drinks per day   Drug use: No   Sexual activity: Not on file  Lifestyle   Physical activity:    Days per week: Not on file    Minutes per session: Not on file   Stress: Not on file  Relationships   Social connections:    Talks on phone: Not on file    Gets together: Not on file    Attends religious service: Not on file    Active member of club or organization: Not on file    Attends meetings of clubs or organizations: Not on file    Relationship status: Not on file   Intimate partner violence:    Fear of current or ex partner: Not on file    Emotionally abused: Not on file    Physically abused: Not on file    Forced sexual activity: Not on file  Other Topics Concern   Not on file  Social History Narrative   Patient is married with 2 children    Patient is retired    Patient lives at [place] History  Problem Relation Age of Onset   Stroke Father    Neuropathy Neg Hx     Past Medical History:  Diagnosis Date   Atrial fibrillation ([place])    Dementia ([place])    Diverticulosis of colon (without mention of hemorrhage)    Hyperlipemia    Hypertension    Melanoma ([place])    Personal history of colonic polyps 1999 & 2004   adenomatous polyps   Ventricular hypertrophy     Past Surgical History:  Procedure Laterality Date   KNEE SURGERY     right    PILONIDAL CYST DRAINAGE     schwanoma tumor lumbar spine     SPINE SURGERY     tumor removed   TONSILLECTOMY AND ADENOIDECTOMY      Current Outpatient Medications  Medication Sig Dispense Refill   acetaminophen (TYLENOL) 500 MG tablet Take 500 mg by mouth 2 (two) times daily. Document area of discomfort and effectiveness of medication.      atorvastatin (LIPITOR) 20 MG tablet Take 20 mg by mouth every evening.      donepezil (ARICEPT) 10 MG tablet Take 1 tablet (10 mg total) by mouth daily. (Patient not taking: Reported on 11/11/2018) 90 tablet 4   folic acid (FOLVITE) 1 MG tablet Take 1 tablet (1 mg total) by mouth daily.     gabapentin (NEURONTIN) 300 MG capsule Take 300 mg by mouth at bedtime as needed (pain).     hydrochlorothiazide (MICROZIDE) 12.5 MG capsule Take 12.5 mg by mouth every other day.     memantine (NAMENDA) 5 MG tablet 1 TABLET TWICE A DAY. (Patient not taking: Reported on 11/11/2018) 180 tablet 4   methenamine (MANDELAMINE) 1 g tablet Take 1,000 mg by mouth 2 (two) times daily.     thiamine 100 MG tablet Take 1 tablet (100 mg total) by mouth daily.     vitamin C (ASCORBIC ACID) 500 MG tablet Take 500 mg by mouth 2 (two) times daily.     warfarin (COUMADIN) 7.5 MG tablet TAKE 1 TABLET DAILY OR AS DIRECTED. (Patient taking differently: 1/2 tablet Mon, Wed, Fri and 1 tablet Tues, Thurs, Sat, Sun.) 30 tablet 3   No current facility-administered medications for this visit.     Allergies as of 11/16/2018 - Review Complete 11/11/2018  Allergen Reaction Noted   Penicillins Rash 04/08/2011    Vitals: There were no vitals taken for this visit. Last Weight:  Wt Readings from Last 1 Encounters:  06/08/18 278 lb (126.1 kg)   FRT:MYTRZ is no height or weight on file to calculate BMI.     Last Height:   Ht Readings from Last 1 Encounters:  06/08/18 _0  (2.007 m)    Physical exam: none    MOCA: Montreal Cognitive Assessment  07/26/2014  Visuospatial/  Executive (0/5) 3  Naming (0/3) 3  Attention: Read list of digits (0/2) 2  Attention: Read list of letters (0/1) 1  Attention: Serial 7 subtraction starting at 100 (0/3) 3  Language: Repeat phrase (0/2) 2  Language : Fluency (0/1) 1  Abstraction (0/2) 2  Delayed Recall (0/5) 0  Orientation (0/6) 5  Total 22  Adjusted Score (based on education) 22   MMSE: MMSE - Mini Mental State Exam 11/23/2017 11/05/2016  Orientation to time 2 5  Orientation to Place 5 5  Registration 3 3  Attention/ Calculation 5 5  Recall 2 1  Language- name 2 objects 2 2  Language- repeat 1 1  Language- follow 3 step command 3 3  Language- read & follow direction 1 1  Write a sentence 1 1  Copy design 1 1  Total score 26 28    No physical exam possible in phone visit.   Reported were a 19" neck size and Mallampati 3.  Assessment and Plan:   I discussed the assessment and treatment plan with the patient. The patient was provided an opportunity to ask questions and all were answered. The patient agreed with the plan and demonstrated an understanding of the instructions.   The patient was advised to call back or seek an in-person evaluation if the symptoms worsen or if the condition fails to improve as anticipated.  I provided 20 minutes of non-face-to-face time during this encounter.   Larey Seat, MD     1)  Ordered HST - for pick up by car. Patient and wife expect a phone call. We will have a face to face RV in 2-3 month for results and treatment plan. He will have had his MRI brain by then, too.    The patient was advised of the nature of the diagnosed disorder , the treatment options and the  risks for general health and wellness arising from not treating the condition.    Larey Seat, MD 7/35/6701, 4:10 PM  Certified in Neurology by ABPN Certified in [place] by [facility] [address], [place], [place] [address]

## 2018-11-16 NOTE — Patient Instructions (Signed)

## 2018-11-24 NOTE — Telephone Encounter (Signed)
Pt wife has called back to inform she would like to hold off on the sleep study for now. Pt is fine, she will call back when she is ready to proceed.  Wife states she wants to complete what was initially suggested by Dr Jaynee Eagles.  No call back requested.

## 2018-12-02 ENCOUNTER — Other Ambulatory Visit: Payer: Self-pay

## 2018-12-02 ENCOUNTER — Ambulatory Visit
Admission: RE | Admit: 2018-12-02 | Discharge: 2018-12-02 | Disposition: A | Discharge status: Home / Self Care | Payer: Medicare Other | Source: Ambulatory Visit | Attending: Neurology | Admitting: Neurology

## 2018-12-02 DIAGNOSIS — Z7901 Long term (current) use of anticoagulants: Secondary | ICD-10-CM | POA: Diagnosis not present

## 2018-12-02 DIAGNOSIS — F0150 Vascular dementia without behavioral disturbance: Secondary | ICD-10-CM | POA: Diagnosis not present

## 2018-12-02 DIAGNOSIS — R269 Unspecified abnormalities of gait and mobility: Secondary | ICD-10-CM | POA: Diagnosis not present

## 2018-12-02 DIAGNOSIS — R400 Somnolence: Secondary | ICD-10-CM

## 2018-12-02 DIAGNOSIS — G4710 Hypersomnia, unspecified: Secondary | ICD-10-CM | POA: Diagnosis not present

## 2018-12-02 DIAGNOSIS — I4891 Unspecified atrial fibrillation: Secondary | ICD-10-CM | POA: Diagnosis not present

## 2018-12-02 LAB — POCT INR: INR: 2.7 (ref 2.0–3.0)

## 2018-12-06 ENCOUNTER — Ambulatory Visit (INDEPENDENT_AMBULATORY_CARE_PROVIDER_SITE_OTHER): Payer: Medicare Other | Admitting: Pharmacist

## 2018-12-06 DIAGNOSIS — I4891 Unspecified atrial fibrillation: Secondary | ICD-10-CM | POA: Diagnosis not present

## 2018-12-06 DIAGNOSIS — Z5181 Encounter for therapeutic drug level monitoring: Secondary | ICD-10-CM | POA: Diagnosis not present

## 2018-12-07 ENCOUNTER — Telehealth: Payer: Self-pay | Admitting: *Deleted

## 2018-12-07 NOTE — Telephone Encounter (Signed)
-----   Message from Melvenia Beam, MD sent at 12/06/2018  5:48 PM EDT ----- No significant changes from 2016, which is good news.  thanks.

## 2018-12-07 NOTE — Telephone Encounter (Signed)
Spoke with pt's wife (on [place]) and let her know the good news that's pt's MRI showed no significant changes from 2016. She verbalized understanding and appreciation.

## 2018-12-09 DIAGNOSIS — G629 Polyneuropathy, unspecified: Secondary | ICD-10-CM | POA: Diagnosis not present

## 2018-12-09 DIAGNOSIS — I739 Peripheral vascular disease, unspecified: Secondary | ICD-10-CM | POA: Diagnosis not present

## 2018-12-09 DIAGNOSIS — D696 Thrombocytopenia, unspecified: Secondary | ICD-10-CM | POA: Diagnosis not present

## 2018-12-09 DIAGNOSIS — I480 Paroxysmal atrial fibrillation: Secondary | ICD-10-CM | POA: Diagnosis not present

## 2018-12-09 DIAGNOSIS — I700 Atherosclerosis of aorta: Secondary | ICD-10-CM | POA: Diagnosis not present

## 2018-12-09 DIAGNOSIS — Z7901 Long term (current) use of anticoagulants: Secondary | ICD-10-CM | POA: Diagnosis not present

## 2018-12-09 DIAGNOSIS — N2889 Other specified disorders of kidney and ureter: Secondary | ICD-10-CM | POA: Diagnosis not present

## 2018-12-09 DIAGNOSIS — C641 Malignant neoplasm of right kidney, except renal pelvis: Secondary | ICD-10-CM | POA: Diagnosis not present

## 2018-12-09 DIAGNOSIS — N183 Chronic kidney disease, stage 3 (moderate): Secondary | ICD-10-CM | POA: Diagnosis not present

## 2018-12-09 DIAGNOSIS — G3184 Mild cognitive impairment, so stated: Secondary | ICD-10-CM | POA: Diagnosis not present

## 2018-12-09 DIAGNOSIS — R627 Adult failure to thrive: Secondary | ICD-10-CM | POA: Diagnosis not present

## 2018-12-09 DIAGNOSIS — I2721 Secondary pulmonary arterial hypertension: Secondary | ICD-10-CM | POA: Diagnosis not present

## 2018-12-10 DIAGNOSIS — C641 Malignant neoplasm of right kidney, except renal pelvis: Secondary | ICD-10-CM | POA: Diagnosis not present

## 2018-12-10 LAB — BASIC METABOLIC PANEL
BUN: 16 (ref 4–21)
Creatinine: 1.1 (ref 0.6–1.3)
Glucose: 123
Potassium: 4.4 (ref 3.4–5.3)
Sodium: 141 (ref 137–147)

## 2018-12-10 LAB — HEPATIC FUNCTION PANEL
ALT: 13 (ref 10–40)
AST: 15 (ref 14–40)
Alkaline Phosphatase: 65 (ref 25–125)
Bilirubin, Total: 1.3

## 2019-01-06 ENCOUNTER — Ambulatory Visit (INDEPENDENT_AMBULATORY_CARE_PROVIDER_SITE_OTHER): Payer: Medicare Other | Admitting: Cardiovascular Disease

## 2019-01-06 DIAGNOSIS — Z7901 Long term (current) use of anticoagulants: Secondary | ICD-10-CM | POA: Diagnosis not present

## 2019-01-06 DIAGNOSIS — Z5181 Encounter for therapeutic drug level monitoring: Secondary | ICD-10-CM | POA: Diagnosis not present

## 2019-01-06 DIAGNOSIS — I4891 Unspecified atrial fibrillation: Secondary | ICD-10-CM | POA: Diagnosis not present

## 2019-01-06 LAB — PROTIME-INR: INR: 3.2 — AB (ref 0.9–1.1)

## 2019-01-06 NOTE — Patient Instructions (Signed)
Description   Spoke with pt's wife, and instructed pt to take 1/2 tablet today then continue usual dose 1/2 tablet on Monday, Wednesday, Friday, 1 tablet all other days.  Order faxed back to [facility] to check INR in 4 weeks and fax results to [facility] at [phone]. Order faxed to [facility] [phone].

## 2019-02-03 ENCOUNTER — Ambulatory Visit (INDEPENDENT_AMBULATORY_CARE_PROVIDER_SITE_OTHER): Payer: Medicare Other | Admitting: Cardiology

## 2019-02-03 DIAGNOSIS — Z7901 Long term (current) use of anticoagulants: Secondary | ICD-10-CM

## 2019-02-03 DIAGNOSIS — I4891 Unspecified atrial fibrillation: Secondary | ICD-10-CM | POA: Diagnosis not present

## 2019-02-03 DIAGNOSIS — Z5181 Encounter for therapeutic drug level monitoring: Secondary | ICD-10-CM | POA: Diagnosis not present

## 2019-02-03 LAB — POCT INR: INR: 3.5 — AB (ref 2.0–3.0)

## 2019-02-04 NOTE — Patient Instructions (Signed)
Description   Spoke with pt's wife, and instructed pt to skip Coumadin today then continue usual dose 1/2 tablet on Monday, Wednesday, Friday, 1 tablet all other days.  Order faxed back to [facility] to check INR in 3 weeks and fax results to [facility] at [phone]. Order faxed to [facility] [phone].

## 2019-02-11 DIAGNOSIS — L821 Other seborrheic keratosis: Secondary | ICD-10-CM | POA: Diagnosis not present

## 2019-02-11 DIAGNOSIS — D044 Carcinoma in situ of skin of scalp and neck: Secondary | ICD-10-CM | POA: Diagnosis not present

## 2019-02-11 DIAGNOSIS — Z85828 Personal history of other malignant neoplasm of skin: Secondary | ICD-10-CM | POA: Diagnosis not present

## 2019-02-11 DIAGNOSIS — Z85820 Personal history of malignant melanoma of skin: Secondary | ICD-10-CM | POA: Diagnosis not present

## 2019-02-11 DIAGNOSIS — D0439 Carcinoma in situ of skin of other parts of face: Secondary | ICD-10-CM | POA: Diagnosis not present

## 2019-02-24 DIAGNOSIS — Z5181 Encounter for therapeutic drug level monitoring: Secondary | ICD-10-CM | POA: Diagnosis not present

## 2019-02-24 DIAGNOSIS — Z7901 Long term (current) use of anticoagulants: Secondary | ICD-10-CM | POA: Diagnosis not present

## 2019-02-24 DIAGNOSIS — I4891 Unspecified atrial fibrillation: Secondary | ICD-10-CM | POA: Diagnosis not present

## 2019-02-24 LAB — PROTIME-INR: INR: 3.1 — AB (ref 0.9–1.1)

## 2019-02-28 ENCOUNTER — Ambulatory Visit (INDEPENDENT_AMBULATORY_CARE_PROVIDER_SITE_OTHER): Payer: Medicare Other | Admitting: Internal Medicine

## 2019-02-28 DIAGNOSIS — Z5181 Encounter for therapeutic drug level monitoring: Secondary | ICD-10-CM

## 2019-02-28 DIAGNOSIS — I4891 Unspecified atrial fibrillation: Secondary | ICD-10-CM

## 2019-02-28 NOTE — Patient Instructions (Addendum)
Description   Spoke with pt's wife, and instructed for pt to start taking 1/2 a tablet daily except for 1 tablet on Sunday, Wednesday and Friday. Instructed pt to eat an extra serving greens today.  Order faxed back to [facility] to check INR on 03/17/2019 ( 2.5 weeks) and fax results to [facility] at [phone]. Order faxed to [facility] [phone]

## 2019-03-17 ENCOUNTER — Ambulatory Visit (INDEPENDENT_AMBULATORY_CARE_PROVIDER_SITE_OTHER): Payer: Medicare Other | Admitting: Cardiovascular Disease

## 2019-03-17 DIAGNOSIS — Z5181 Encounter for therapeutic drug level monitoring: Secondary | ICD-10-CM | POA: Diagnosis not present

## 2019-03-17 DIAGNOSIS — I4891 Unspecified atrial fibrillation: Secondary | ICD-10-CM

## 2019-03-17 LAB — PROTIME-INR: INR: 2.6 — AB (ref 0.9–1.1)

## 2019-03-21 ENCOUNTER — Telehealth: Payer: Self-pay | Admitting: Oncology

## 2019-03-21 NOTE — Telephone Encounter (Signed)
Called patient regarding pre-screening questions for 09/15 appointments. °

## 2019-03-22 ENCOUNTER — Inpatient Hospital Stay (HOSPITAL_BASED_OUTPATIENT_CLINIC_OR_DEPARTMENT_OTHER): Payer: Medicare Other | Admitting: Oncology

## 2019-03-22 ENCOUNTER — Inpatient Hospital Stay: Payer: Medicare Other | Attending: Oncology

## 2019-03-22 ENCOUNTER — Telehealth: Payer: Self-pay

## 2019-03-22 ENCOUNTER — Other Ambulatory Visit: Payer: Self-pay

## 2019-03-22 VITALS — BP 107/82 | HR 71 | Temp 98.0°F | Resp 17 | Ht 79.0 in | Wt 299.4 lb

## 2019-03-22 DIAGNOSIS — Z7901 Long term (current) use of anticoagulants: Secondary | ICD-10-CM | POA: Insufficient documentation

## 2019-03-22 DIAGNOSIS — D72819 Decreased white blood cell count, unspecified: Secondary | ICD-10-CM | POA: Insufficient documentation

## 2019-03-22 DIAGNOSIS — D696 Thrombocytopenia, unspecified: Secondary | ICD-10-CM | POA: Diagnosis not present

## 2019-03-22 DIAGNOSIS — Z79899 Other long term (current) drug therapy: Secondary | ICD-10-CM | POA: Diagnosis not present

## 2019-03-22 LAB — CBC WITH DIFFERENTIAL (CANCER CENTER ONLY)
Abs Immature Granulocytes: 0.12 10*3/uL — ABNORMAL HIGH (ref 0.00–0.07)
Basophils Absolute: 0 10*3/uL (ref 0.0–0.1)
Basophils Relative: 0 %
Eosinophils Absolute: 0 10*3/uL (ref 0.0–0.5)
Eosinophils Relative: 0 %
HCT: 48.2 % (ref 39.0–52.0)
Hemoglobin: 16 g/dL (ref 13.0–17.0)
Immature Granulocytes: 5 %
Lymphocytes Relative: 35 %
Lymphs Abs: 0.8 10*3/uL (ref 0.7–4.0)
MCH: 31.7 pg (ref 26.0–34.0)
MCHC: 33.2 g/dL (ref 30.0–36.0)
MCV: 95.4 fL (ref 80.0–100.0)
Monocytes Absolute: 0.3 10*3/uL (ref 0.1–1.0)
Monocytes Relative: 13 %
Neutro Abs: 1.1 10*3/uL — ABNORMAL LOW (ref 1.7–7.7)
Neutrophils Relative %: 47 %
Platelet Count: 82 10*3/uL — ABNORMAL LOW (ref 150–400)
RBC: 5.05 MIL/uL (ref 4.22–5.81)
RDW: 13.9 % (ref 11.5–15.5)
WBC Count: 2.3 10*3/uL — ABNORMAL LOW (ref 4.0–10.5)
nRBC: 0 % (ref 0.0–0.2)

## 2019-03-22 LAB — CMP (CANCER CENTER ONLY)
ALT: 15 U/L (ref 0–44)
AST: 17 U/L (ref 15–41)
Albumin: 4.1 g/dL (ref 3.5–5.0)
Alkaline Phosphatase: 73 U/L (ref 38–126)
Anion gap: 8 (ref 5–15)
BUN: 17 mg/dL (ref 8–23)
CO2: 28 mmol/L (ref 22–32)
Calcium: 10.3 mg/dL (ref 8.9–10.3)
Chloride: 104 mmol/L (ref 98–111)
Creatinine: 1.3 mg/dL — ABNORMAL HIGH (ref 0.61–1.24)
GFR, Est AFR Am: 58 mL/min — ABNORMAL LOW (ref >60)
GFR, Estimated: 50 mL/min — ABNORMAL LOW (ref >60)
Glucose, Bld: 206 mg/dL — ABNORMAL HIGH (ref 70–99)
Potassium: 4.2 mmol/L (ref 3.5–5.1)
Sodium: 140 mmol/L (ref 135–145)
Total Bilirubin: 1.2 mg/dL (ref 0.3–1.2)
Total Protein: 6.4 g/dL — ABNORMAL LOW (ref 6.5–8.1)

## 2019-03-22 NOTE — Progress Notes (Signed)
Hematology and Oncology Follow Up Visit  Peter Singh [id number] 1931-07-26 85 y.o. 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   Current therapy: Active surveillance   Interim History:  [name] returns for repeat evaluation.  Since the last visit, this the last visit, he reports no major changes in his health.  He denies any active bleeding, cough or wheezing.  He denies any recurrent infections or hospitalizations.  He remains ambulatory with the help of a walker without any falls or syncope.   Patient denied any alteration mental status, neuropathy, confusion or dizziness.  Denies any headaches or lethargy.  Denies any night sweats, weight loss or changes in appetite.  Denied orthopnea, dyspnea on exertion or chest discomfort.  Denies shortness of breath, difficulty breathing hemoptysis or cough.  Denies any abdominal distention, nausea, early satiety or dyspepsia.  Denies any hematuria, frequency, dysuria or nocturia.  Denies any skin irritation, dryness or rash.  Denies any ecchymosis or petechiae.  Denies any lymphadenopathy or clotting.  Denies any heat or cold intolerance.  Denies any anxiety or depression.  Remaining review of system is negative.      Medications: no changes on review Current Outpatient Medications  Medication Sig Dispense Refill  . acetaminophen (TYLENOL) 500 MG tablet Take 500 mg by mouth 2 (two) times daily. Document area of discomfort and effectiveness of medication.     Marland Kitchen atorvastatin (LIPITOR) 20 MG tablet Take 20 mg by mouth every evening.     . gabapentin (NEURONTIN) 300 MG capsule Take 300 mg by mouth at bedtime as needed (pain).    . hydrochlorothiazide (MICROZIDE) 12.5 MG capsule Take 12.5 mg by mouth every other day.    . methenamine (MANDELAMINE) 1 g tablet Take 1,000 mg by mouth 2 (two) times  daily.    Marland Kitchen thiamine 100 MG tablet Take 1 tablet (100 mg total) by mouth daily.    . vitamin C (ASCORBIC ACID) 500 MG tablet Take 500 mg by mouth 2 (two) times daily.    Marland Kitchen warfarin (COUMADIN) 7.5 MG tablet TAKE 1 TABLET DAILY OR AS DIRECTED. (Patient taking differently: 1/2 tablet Mon, Wed, Fri and 1 tablet Tues, Thurs, Sat, Sun.) 30 tablet 3   No current facility-administered medications for this visit.      Allergies:  Allergies  Allergen Reactions  . Penicillins Rash    Has patient had a PCN reaction causing immediate rash, facial/tongue/throat swelling, SOB or lightheadedness with hypotension: unknown Has patient had a PCN reaction causing severe rash involving mucus membranes or skin necrosis: unknown Has patient had a PCN reaction that required hospitalization : unknown Has patient had a PCN reaction occurring within the last 10 years: no If all of the above answers are "NO", then may proceed with Cephalosporin use.     Past Medical History, Surgical history, Social history, and Family History updated without changes.   Physical Exam:   Blood pressure 107/82, pulse 71, temperature 98 F (36.7 C), temperature source Oral, resp. rate 17, height _0  (2.007 m), weight 299 lb 6.4 oz (135.8 kg), SpO2 98 %.    ECOG: 1     General appearance: Alert, awake without any distress. Head: Atraumatic without abnormalities Oropharynx: Without any thrush or ulcers. Eyes: No scleral icterus. Lymph nodes: No lymphadenopathy noted in the cervical, supraclavicular, or axillary nodes Heart:regular rate and rhythm, without any murmurs  or gallops.   Lung: Clear to auscultation without any rhonchi, wheezes or dullness to percussion. Abdomin: Soft, nontender without any shifting dullness or ascites. Musculoskeletal: No clubbing or cyanosis. Neurological: No motor or sensory deficits. Skin: No rashes or lesions.   Lab Results: Lab Results  Component Value Date   WBC 2.3 (L) 03/23/2018    HGB 15.7 03/23/2018   HCT 46.7 03/23/2018   MCV 92.8 03/23/2018   PLT 74 (L) 03/23/2018     Chemistry      Component Value Date/Time   NA 141 09/23/2017 0809   NA 145 10/13/2016 0300   NA 144 09/17/2016 0742   K 4.2 09/23/2017 0809   K 4.1 09/17/2016 0742   CL 106 09/23/2017 0809   CO2 27 09/23/2017 0809   CO2 30 (H) 09/17/2016 0742   BUN 16 09/23/2017 0809   BUN 17 10/13/2016 0300   BUN 18.1 09/17/2016 0742   CREATININE 1.20 09/23/2017 0809   CREATININE 1.2 09/17/2016 0742   GLU 111 10/13/2016 0300      Component Value Date/Time   CALCIUM 10.6 (H) 09/23/2017 0809   CALCIUM 10.4 09/17/2016 0742   ALKPHOS 66 09/23/2017 0809   ALKPHOS 77 09/17/2016 0742   AST 20 09/23/2017 0809   AST 22 09/17/2016 0742   ALT 18 09/23/2017 0809   ALT 21 09/17/2016 0742   BILITOT 1.2 09/23/2017 0809   BILITOT 1.38 (H) 09/17/2016 0742       Impression and Plan:  86 year old man with:   1. Thrombocytopenia with platelet count fluctuating above 50,000 without any active bleeding since 2017.    The differential diagnosis was reviewed again.  The etiology of his thrombocytopenia appears to be related to liver disease although other considerations such as ITP or myelodysplasia is a consideration.  Laboratory testing in June 2020 showed a platelet count of 68,000.  CBC from today was personally reviewed and discussed with the patient with a platelet count of 82,000.  From a management standpoint, I recommended continued active surveillance for the time being.  Bone marrow biopsy would be a consideration if he developing worsening cytopenias.  He is agreeable with this plan.   2. Leukocytopenia: Absolute neutrophil count is above 1000 without any recurrent infections.  I recommended continued monitoring without any need for growth factor support.  3. Follow-up: In 12 months for repeat evaluation.  15  minutes was spent with the patient face-to-face today.  More than 50% of time was spent  on updating his disease status, discussing differential diagnosis and management options.    Zola Button, MD 9/15/20208:19 AM

## 2019-04-11 ENCOUNTER — Other Ambulatory Visit: Payer: Self-pay | Admitting: Interventional Cardiology

## 2019-04-13 DIAGNOSIS — N401 Enlarged prostate with lower urinary tract symptoms: Secondary | ICD-10-CM | POA: Diagnosis not present

## 2019-04-13 DIAGNOSIS — C641 Malignant neoplasm of right kidney, except renal pelvis: Secondary | ICD-10-CM | POA: Diagnosis not present

## 2019-04-13 DIAGNOSIS — R3914 Feeling of incomplete bladder emptying: Secondary | ICD-10-CM | POA: Diagnosis not present

## 2019-04-14 DIAGNOSIS — I4891 Unspecified atrial fibrillation: Secondary | ICD-10-CM | POA: Diagnosis not present

## 2019-04-14 DIAGNOSIS — Z5181 Encounter for therapeutic drug level monitoring: Secondary | ICD-10-CM | POA: Diagnosis not present

## 2019-04-14 LAB — PROTIME-INR: INR: 1.7 — AB (ref <=1.1)

## 2019-04-15 ENCOUNTER — Ambulatory Visit (INDEPENDENT_AMBULATORY_CARE_PROVIDER_SITE_OTHER): Payer: Medicare Other | Admitting: Internal Medicine

## 2019-04-15 DIAGNOSIS — I4891 Unspecified atrial fibrillation: Secondary | ICD-10-CM

## 2019-04-15 DIAGNOSIS — Z5181 Encounter for therapeutic drug level monitoring: Secondary | ICD-10-CM | POA: Diagnosis not present

## 2019-04-15 NOTE — Patient Instructions (Addendum)
Description   Spoke with pt's wife, and instructed for pt to continue taking 1/2 a tablet daily except for 1 tablet on Sunday, Wednesday and Friday. Pt took an extra 1/2 tablet yesterday. Order faxed back to [facility] to check INR 2 weeks and fax results to [facility] at [phone]. Order faxed to [facility] [phone],

## 2019-04-21 DIAGNOSIS — Z23 Encounter for immunization: Secondary | ICD-10-CM | POA: Diagnosis not present

## 2019-04-28 ENCOUNTER — Ambulatory Visit (INDEPENDENT_AMBULATORY_CARE_PROVIDER_SITE_OTHER): Payer: Medicare Other | Admitting: Cardiovascular Disease

## 2019-04-28 DIAGNOSIS — Z20828 Contact with and (suspected) exposure to other viral communicable diseases: Secondary | ICD-10-CM | POA: Diagnosis not present

## 2019-04-28 DIAGNOSIS — Z5181 Encounter for therapeutic drug level monitoring: Secondary | ICD-10-CM

## 2019-04-28 DIAGNOSIS — I4891 Unspecified atrial fibrillation: Secondary | ICD-10-CM | POA: Diagnosis not present

## 2019-04-28 LAB — POCT INR: INR: 2.2 (ref 2.0–3.0)

## 2019-04-29 DIAGNOSIS — M62561 Muscle wasting and atrophy, not elsewhere classified, right lower leg: Secondary | ICD-10-CM | POA: Diagnosis not present

## 2019-04-29 DIAGNOSIS — C641 Malignant neoplasm of right kidney, except renal pelvis: Secondary | ICD-10-CM | POA: Diagnosis not present

## 2019-04-29 DIAGNOSIS — G3184 Mild cognitive impairment, so stated: Secondary | ICD-10-CM | POA: Diagnosis not present

## 2019-04-29 DIAGNOSIS — D696 Thrombocytopenia, unspecified: Secondary | ICD-10-CM | POA: Diagnosis not present

## 2019-04-29 DIAGNOSIS — R339 Retention of urine, unspecified: Secondary | ICD-10-CM | POA: Diagnosis not present

## 2019-04-29 DIAGNOSIS — R2689 Other abnormalities of gait and mobility: Secondary | ICD-10-CM | POA: Diagnosis not present

## 2019-04-29 DIAGNOSIS — I2721 Secondary pulmonary arterial hypertension: Secondary | ICD-10-CM | POA: Diagnosis not present

## 2019-04-29 DIAGNOSIS — M62562 Muscle wasting and atrophy, not elsewhere classified, left lower leg: Secondary | ICD-10-CM | POA: Diagnosis not present

## 2019-04-29 DIAGNOSIS — I480 Paroxysmal atrial fibrillation: Secondary | ICD-10-CM | POA: Diagnosis not present

## 2019-04-29 DIAGNOSIS — I6990 Unspecified sequelae of unspecified cerebrovascular disease: Secondary | ICD-10-CM | POA: Diagnosis not present

## 2019-04-29 DIAGNOSIS — G629 Polyneuropathy, unspecified: Secondary | ICD-10-CM | POA: Diagnosis not present

## 2019-04-29 DIAGNOSIS — C439 Malignant melanoma of skin, unspecified: Secondary | ICD-10-CM | POA: Diagnosis not present

## 2019-04-29 DIAGNOSIS — R197 Diarrhea, unspecified: Secondary | ICD-10-CM | POA: Diagnosis not present

## 2019-04-29 DIAGNOSIS — Z7901 Long term (current) use of anticoagulants: Secondary | ICD-10-CM | POA: Diagnosis not present

## 2019-04-29 DIAGNOSIS — I739 Peripheral vascular disease, unspecified: Secondary | ICD-10-CM | POA: Diagnosis not present

## 2019-04-29 DIAGNOSIS — R278 Other lack of coordination: Secondary | ICD-10-CM | POA: Diagnosis not present

## 2019-04-29 NOTE — Progress Notes (Signed)
Pt's wife stated pt moved to the [facility] section of [facility] yesterday on 04/28/2019. Attempted to call and update them to make sure they got order. Unable to [facility]   @0930am : Spoke with Bernedette, from [facility] and confirmed pt's coumadin dosing instructions and order with her. She said in the future now that pt's at snf to call [phone] and we can give updated instructions to the nurse manager. Confirmed that [facility] will continue to give dosing instructions and [facility] will call/fax INR results to coumadin clinic.

## 2019-04-29 NOTE — Patient Instructions (Signed)
Description   Spoke with pt's wife, and instructed for pt to continue taking 1/2 a tablet daily except for 1 tablet on Sunday, Wednesday and Friday. Order faxed back to [facility] to check INR 3 weeks and fax results to [facility] at [phone]. Order faxed to [facility] [phone],  Pt's wife stated that pt moved to [facility] section [facility] yesterday.

## 2019-05-01 ENCOUNTER — Emergency Department (HOSPITAL_COMMUNITY): Payer: Medicare Other

## 2019-05-01 ENCOUNTER — Emergency Department (HOSPITAL_COMMUNITY)
Admission: EM | Admit: 2019-05-01 | Discharge: 2019-05-01 | Disposition: A | Discharge status: Home / Self Care | Payer: Medicare Other | Attending: Emergency Medicine | Admitting: Emergency Medicine

## 2019-05-01 ENCOUNTER — Telehealth: Payer: Self-pay | Admitting: Internal Medicine

## 2019-05-01 ENCOUNTER — Encounter (HOSPITAL_COMMUNITY): Payer: Self-pay | Admitting: Emergency Medicine

## 2019-05-01 DIAGNOSIS — R58 Hemorrhage, not elsewhere classified: Secondary | ICD-10-CM | POA: Diagnosis not present

## 2019-05-01 DIAGNOSIS — M542 Cervicalgia: Secondary | ICD-10-CM | POA: Insufficient documentation

## 2019-05-01 DIAGNOSIS — T839XXA Unspecified complication of genitourinary prosthetic device, implant and graft, initial encounter: Secondary | ICD-10-CM | POA: Diagnosis not present

## 2019-05-01 DIAGNOSIS — Z7901 Long term (current) use of anticoagulants: Secondary | ICD-10-CM | POA: Insufficient documentation

## 2019-05-01 DIAGNOSIS — R279 Unspecified lack of coordination: Secondary | ICD-10-CM | POA: Diagnosis not present

## 2019-05-01 DIAGNOSIS — Z79899 Other long term (current) drug therapy: Secondary | ICD-10-CM | POA: Diagnosis not present

## 2019-05-01 DIAGNOSIS — I959 Hypotension, unspecified: Secondary | ICD-10-CM | POA: Diagnosis not present

## 2019-05-01 DIAGNOSIS — R410 Disorientation, unspecified: Secondary | ICD-10-CM | POA: Diagnosis not present

## 2019-05-01 DIAGNOSIS — Y999 Unspecified external cause status: Secondary | ICD-10-CM | POA: Insufficient documentation

## 2019-05-01 DIAGNOSIS — Y732 Prosthetic and other implants, materials and accessory gastroenterology and urology devices associated with adverse incidents: Secondary | ICD-10-CM | POA: Diagnosis not present

## 2019-05-01 DIAGNOSIS — I110 Hypertensive heart disease with heart failure: Secondary | ICD-10-CM | POA: Insufficient documentation

## 2019-05-01 DIAGNOSIS — W19XXXA Unspecified fall, initial encounter: Secondary | ICD-10-CM | POA: Diagnosis not present

## 2019-05-01 DIAGNOSIS — Z87891 Personal history of nicotine dependence: Secondary | ICD-10-CM | POA: Insufficient documentation

## 2019-05-01 DIAGNOSIS — W010XXA Fall on same level from slipping, tripping and stumbling without subsequent striking against object, initial encounter: Secondary | ICD-10-CM | POA: Insufficient documentation

## 2019-05-01 DIAGNOSIS — S0990XA Unspecified injury of head, initial encounter: Secondary | ICD-10-CM | POA: Diagnosis not present

## 2019-05-01 DIAGNOSIS — T83098A Other mechanical complication of other indwelling urethral catheter, initial encounter: Secondary | ICD-10-CM | POA: Diagnosis not present

## 2019-05-01 DIAGNOSIS — R52 Pain, unspecified: Secondary | ICD-10-CM | POA: Diagnosis not present

## 2019-05-01 DIAGNOSIS — I5032 Chronic diastolic (congestive) heart failure: Secondary | ICD-10-CM | POA: Diagnosis not present

## 2019-05-01 DIAGNOSIS — Y939 Activity, unspecified: Secondary | ICD-10-CM | POA: Diagnosis not present

## 2019-05-01 DIAGNOSIS — Y92121 Bathroom in nursing home as the place of occurrence of the external cause: Secondary | ICD-10-CM | POA: Insufficient documentation

## 2019-05-01 DIAGNOSIS — S199XXA Unspecified injury of neck, initial encounter: Secondary | ICD-10-CM | POA: Diagnosis not present

## 2019-05-01 DIAGNOSIS — Z743 Need for continuous supervision: Secondary | ICD-10-CM | POA: Diagnosis not present

## 2019-05-01 DIAGNOSIS — I10 Essential (primary) hypertension: Secondary | ICD-10-CM | POA: Diagnosis not present

## 2019-05-01 DIAGNOSIS — R5381 Other malaise: Secondary | ICD-10-CM | POA: Diagnosis not present

## 2019-05-01 LAB — URINALYSIS, ROUTINE W REFLEX MICROSCOPIC

## 2019-05-01 LAB — URINALYSIS, MICROSCOPIC (REFLEX)
RBC / HPF: >50 RBC/hpf (ref 0–5)
Squamous Epithelial / HPF: NONE SEEN (ref 0–5)
WBC, UA: >50 WBC/hpf (ref 0–5)

## 2019-05-01 LAB — COMPREHENSIVE METABOLIC PANEL
ALT: 21 U/L (ref 0–44)
AST: 26 U/L (ref 15–41)
Albumin: 4 g/dL (ref 3.5–5.0)
Alkaline Phosphatase: 65 U/L (ref 38–126)
Anion gap: 12 (ref 5–15)
BUN: 27 mg/dL — ABNORMAL HIGH (ref 8–23)
CO2: 25 mmol/L (ref 22–32)
Calcium: 10.3 mg/dL (ref 8.9–10.3)
Chloride: 103 mmol/L (ref 98–111)
Creatinine, Ser: 1.44 mg/dL — ABNORMAL HIGH (ref 0.61–1.24)
GFR calc Af Amer: 51 mL/min — ABNORMAL LOW (ref >60)
GFR calc non Af Amer: 44 mL/min — ABNORMAL LOW (ref >60)
Glucose, Bld: 178 mg/dL — ABNORMAL HIGH (ref 70–99)
Potassium: 4.4 mmol/L (ref 3.5–5.1)
Sodium: 140 mmol/L (ref 135–145)
Total Bilirubin: 1.5 mg/dL — ABNORMAL HIGH (ref 0.3–1.2)
Total Protein: 6.8 g/dL (ref 6.5–8.1)

## 2019-05-01 LAB — CBC WITH DIFFERENTIAL/PLATELET
Abs Immature Granulocytes: 0.2 10*3/uL — ABNORMAL HIGH (ref 0.00–0.07)
Basophils Absolute: 0 10*3/uL (ref 0.0–0.1)
Basophils Relative: 0 %
Eosinophils Absolute: 0 10*3/uL (ref 0.0–0.5)
Eosinophils Relative: 0 %
HCT: 47.4 % (ref 39.0–52.0)
Hemoglobin: 15.5 g/dL (ref 13.0–17.0)
Immature Granulocytes: 6 %
Lymphocytes Relative: 11 %
Lymphs Abs: 0.4 10*3/uL — ABNORMAL LOW (ref 0.7–4.0)
MCH: 31.2 pg (ref 26.0–34.0)
MCHC: 32.7 g/dL (ref 30.0–36.0)
MCV: 95.4 fL (ref 80.0–100.0)
Monocytes Absolute: 0.5 10*3/uL (ref 0.1–1.0)
Monocytes Relative: 14 %
Neutro Abs: 2.4 10*3/uL (ref 1.7–7.7)
Neutrophils Relative %: 69 %
Platelets: DECREASED 10*3/uL (ref 150–400)
RBC: 4.97 MIL/uL (ref 4.22–5.81)
RDW: 13.8 % (ref 11.5–15.5)
WBC: 3.5 10*3/uL — ABNORMAL LOW (ref 4.0–10.5)
nRBC: 0 % (ref 0.0–0.2)

## 2019-05-01 LAB — PROTIME-INR
INR: 2.3 — ABNORMAL HIGH (ref 0.8–1.2)
Prothrombin Time: 25.3 seconds — ABNORMAL HIGH (ref 11.4–15.2)

## 2019-05-01 NOTE — Discharge Instructions (Addendum)
Continue to take your medications as prescribed. Follow-up with Coumadin clinic for monitoring of your Coumadin levels. Keep Foley catheter in place until follow-up with your urologist on November 6 as scheduled. Return to the emergency department if any concerning signs or symptoms develop such as fevers, weakness, altered mental status, persistent vomiting, or loss of consciousness.

## 2019-05-01 NOTE — ED Notes (Signed)
Please call wife Kiah Homeyer at [phone]

## 2019-05-01 NOTE — ED Notes (Signed)
Pt continuing to get out of bed, pulled IV out, blood on floor/bed dripping from penis. Pt put in bed x3.

## 2019-05-01 NOTE — ED Triage Notes (Signed)
EMS stated, [facility] and has pulled out his foley catheter in the last 4 days 4 times for urinary retention. Fell this morning in the BR after pulling out his catheter again. Only complaint is neck pain. Pt takes Warfarin.

## 2019-05-01 NOTE — Telephone Encounter (Signed)
Nursing called at 1:30pm on Saturday to request order to replace Peter Singh's foley catheter.  He has a diagnosis of urinary retention and had been I/O cathing himself at home per nursing.  He had some bleeding when this occurred, but it has stopped now and nurse feels comfortable putting catheter back in.  Order given, but also advised that if he pulls out the catheter again, we can go to I/O caths on regular intervals instead which may decrease risk of UTIs anyway vs foley.

## 2019-05-01 NOTE — ED Provider Notes (Signed)
[facility] EMERGENCY DEPARTMENT Provider Note   CSN: [id number] Arrival date & time: 05/01/19  [id number]     History   Chief Complaint Chief Complaint  Patient presents with  . Fall  . Neck Pain   Level 5 caveat due to dementia  HPI Peter Singh is a 85 y.o. male with history of A. fib on Coumadin, dementia, hyperlipidemia, hypertension presents sent from assisted living facility for evaluation after fall.  Per triage note, the patient has pulled out his indwelling Foley catheter 4 times in the last 4 days.  He has the catheter in place due to urinary retention.  He reportedly fell this morning in the bathroom after pulling out his catheter again but he tells me he does not remember this.  He is unsure if he hit his head or lost consciousness.  He does not know if he pulled out his Foley catheter or if his facility did.  On my assessment he has no complaints.  Denies headache, vision changes, numbness or weakness, abdominal pain, nausea, or vomiting.  He does have some bleeding noted around the urethral meatus.  He tells me he has been compliant with his home medications.  He is oriented to person, knows that he is at [facility], knows that Zackariya Bihl is the president.  He knows the month is October but he thinks the year is 2010.     The history is provided by the patient.    Past Medical History:  Diagnosis Date  . Atrial fibrillation ([place])   . Dementia ([place])   . Diverticulosis of colon (without mention of hemorrhage)   . Hyperlipemia   . Hypertension   . Melanoma ([place])   . Personal history of colonic polyps 1999 & 2004   adenomatous polyps  . Ventricular hypertrophy     Patient Active Problem List   Diagnosis Date Noted  . Other neutropenia ([place]) 11/16/2018  . Chronic diastolic heart failure ([place]) 06/08/2018  . Encounter for therapeutic drug monitoring 03/19/2017  . Urinary retention due to benign prostatic hyperplasia 10/14/2016  . Renal  mass, right 10/14/2016  . Mild dementia ([place]) 10/14/2016  . Sepsis secondary to UTI ([place]) 10/07/2016  . Thrombocytopenia ([place]) 10/07/2016  . Hereditary and idiopathic peripheral neuropathy 07/06/2014  . Essential hypertension 03/24/2014  . Hyperlipidemia 03/24/2014  . Chronic anticoagulation 03/24/2014  . Atrial fibrillation ([place]) 05/17/2013    Past Surgical History:  Procedure Laterality Date  . KNEE SURGERY     right  . PILONIDAL CYST DRAINAGE    . schwanoma tumor lumbar spine    . SPINE SURGERY     tumor removed  . TONSILLECTOMY AND ADENOIDECTOMY          Home Medications    Prior to Admission medications   Medication Sig Start Date End Date Taking? Authorizing Provider  acetaminophen (TYLENOL) 500 MG tablet Take 500 mg by mouth 2 (two) times daily. Document area of discomfort and effectiveness of medication.     [provider]  atorvastatin (LIPITOR) 20 MG tablet Take 20 mg by mouth every evening.     [provider]  cholecalciferol (VITAMIN D3) 25 MCG (1000 UT) tablet Take 1,000 Units by mouth 2 (two) times daily.    [provider]  gabapentin (NEURONTIN) 300 MG capsule Take 300 mg by mouth at bedtime as needed (pain).    [provider]  hydrochlorothiazide (MICROZIDE) 12.5 MG capsule Take 12.5 mg by mouth every other  day.    [provider]  methenamine (MANDELAMINE) 1 g tablet Take 1,000 mg by mouth 2 (two) times daily.    [provider]  thiamine 100 MG tablet Take 1 tablet (100 mg total) by mouth daily. 03/14/16   Kinnie Feil, MD  vitamin C (ASCORBIC ACID) 500 MG tablet Take 500 mg by mouth 2 (two) times daily.    [provider]  warfarin (COUMADIN) 7.5 MG tablet TAKE 1 TABLET DAILY OR AS DIRECTED. 04/11/19   [name], MD    Family History Family History  Problem Relation Age of Onset  . Stroke Father   . Neuropathy Neg Hx     Social History Social History   Tobacco Use  . Smoking  status: Former Smoker    Packs/day: 3.00    Types: Cigarettes    Quit date: 06/09/1975    Years since quitting: 43.9  . Smokeless tobacco: Never Used  Substance Use Topics  . Alcohol use: Yes    Alcohol/week: 0.0 standard drinks    Comment: 2 drinks per day  . Drug use: No     Allergies   Penicillins   Review of Systems Review of Systems  Unable to perform ROS: Dementia     Physical Exam Updated Vital Signs BP (!) 145/57 (BP Location: Right Arm)   Pulse (!) 53   Temp 98 F (36.7 C)   Resp 18   Ht 6\' 7"  (2.007 m)   Wt 124.7 kg   SpO2 99%   BMI 30.98 kg/m   Physical Exam Vitals signs and nursing note reviewed.  Constitutional:      General: He is not in acute distress.    Appearance: He is well-developed.  HENT:     Head: Normocephalic and atraumatic.     Comments: No Battle's signs, no raccoon's eyes, no rhinorrhea. No hemotympanum. No tenderness to palpation of the face or skull. No deformity, crepitus, or swelling noted.  Eyes:     General:        Right eye: No discharge.        Left eye: No discharge.     Extraocular Movements: Extraocular movements intact.     Conjunctiva/sclera: Conjunctivae normal.     Pupils: Pupils are equal, round, and reactive to light.  Neck:     Musculoskeletal: Neck supple.     Vascular: No JVD.     Trachea: No tracheal deviation.     Comments: C-collar in place.  No midline cervical spine tenderness, no deformity, crepitus, or step-off Cardiovascular:     Rate and Rhythm: Normal rate and regular rhythm.     Pulses: Normal pulses.     Heart sounds: Normal heart sounds.  Pulmonary:     Effort: Pulmonary effort is normal.     Breath sounds: Normal breath sounds.  Abdominal:     General: Bowel sounds are normal. There is no distension.     Palpations: Abdomen is soft.     Tenderness: There is no abdominal tenderness. There is no guarding or rebound.  Genitourinary:    Comments: Bleeding noted from the urethral meatus  Musculoskeletal:     Comments: No midline thoracic or lumbar spine tenderness, no parathoracic or paralumbar muscle tenderness.  No deformity, crepitus, or step-off.  Pelvis appears stable.  5/5 strength BUE and BLE major muscle groups.  Skin:    General: Skin is warm and dry.     Findings: No erythema.  Neurological:  Mental Status: He is alert.     Cranial Nerves: No cranial nerve deficit.     Sensory: No sensory deficit.     Comments: Fluent speech with no evidence of dysarthria or aphasia, no facial droop.  He is mostly oriented but appears mildly confused.  Cranial nerves II through XII tested and intact.  Moves extremities spontaneously with out difficulty.  Sensation intact to light touch of face and extremities.  Good muscle tone.  Psychiatric:        Behavior: Behavior normal.      ED Treatments / Results  Labs (all labs ordered are listed, but only abnormal results are displayed) Labs Reviewed  COMPREHENSIVE METABOLIC PANEL - Abnormal; Notable for the following components:      Result Value   Glucose, Bld 178 (*)    BUN 27 (*)    Creatinine, Ser 1.44 (*)    Total Bilirubin 1.5 (*)    GFR calc non Af Amer 44 (*)    GFR calc Af Amer 51 (*)    All other components within normal limits  CBC WITH DIFFERENTIAL/PLATELET - Abnormal; Notable for the following components:   WBC 3.5 (*)    Lymphs Abs 0.4 (*)    Abs Immature Granulocytes 0.20 (*)    All other components within normal limits  URINALYSIS, ROUTINE W REFLEX MICROSCOPIC - Abnormal; Notable for the following components:   Color, Urine RED (*)    APPearance TURBID (*)    Glucose, UA   (*)    Value: TEST NOT REPORTED DUE TO COLOR INTERFERENCE OF URINE PIGMENT   Hgb urine dipstick   (*)    Value: TEST NOT REPORTED DUE TO COLOR INTERFERENCE OF URINE PIGMENT   Bilirubin Urine   (*)    Value: TEST NOT REPORTED DUE TO COLOR INTERFERENCE OF URINE PIGMENT   Ketones, ur   (*)    Value: TEST NOT REPORTED DUE TO COLOR  INTERFERENCE OF URINE PIGMENT   Protein, ur   (*)    Value: TEST NOT REPORTED DUE TO COLOR INTERFERENCE OF URINE PIGMENT   Nitrite   (*)    Value: TEST NOT REPORTED DUE TO COLOR INTERFERENCE OF URINE PIGMENT   Leukocytes,Ua   (*)    Value: TEST NOT REPORTED DUE TO COLOR INTERFERENCE OF URINE PIGMENT   All other components within normal limits  PROTIME-INR - Abnormal; Notable for the following components:   Prothrombin Time 25.3 (*)    INR 2.3 (*)    All other components within normal limits  URINALYSIS, MICROSCOPIC (REFLEX) - Abnormal; Notable for the following components:   Bacteria, UA MANY (*)    All other components within normal limits  URINE CULTURE    EKG EKG Interpretation  Date/Time:  Sunday May 01 2019 11:12:49 EDT Ventricular Rate:  76 PR Interval:    QRS Duration: 104 QT Interval:  390 QTC Calculation: 439 R Axis:   -66 Text Interpretation:  Atrial fibrillation Inferior infarct, old Anterior infarct, old Confirmed by Gerlene Fee [phone]) on 05/01/2019 11:38:02 AM   Radiology Ct Head Wo Contrast  Result Date: 05/01/2019 CLINICAL DATA:  Fall. On anticoagulation. EXAM: CT HEAD WITHOUT CONTRAST CT CERVICAL SPINE WITHOUT CONTRAST TECHNIQUE: Multidetector CT imaging of the head and cervical spine was performed following the standard protocol without intravenous contrast. Multiplanar CT image reconstructions of the cervical spine were also generated. COMPARISON:  MRI brain 12/02/2018 FINDINGS: CT HEAD FINDINGS Brain: No evidence of acute infarction, hemorrhage, hydrocephalus,  extra-axial collection or mass lesion/mass effect. There is mild diffuse low-attenuation within the subcortical and periventricular white matter compatible with chronic microvascular disease. Prominence of the sulci and ventricles compatible with brain atrophy. Vascular: No hyperdense vessel or unexpected calcification. Skull: Normal. Negative for fracture or focal lesion. Sinuses/Orbits: No acute  finding. Other: None. CT CERVICAL SPINE FINDINGS Alignment: Normal. Skull base and vertebrae: No acute fracture. No primary bone lesion or focal pathologic process. Soft tissues and spinal canal: No prevertebral fluid or swelling. No visible canal hematoma. Disc levels: Multi level disc space narrowing and endplate spurring is identified throughout the cervical spine. Upper chest: Negative. Other: None IMPRESSION: 1. No acute intracranial abnormalities. 2. Chronic small vessel ischemic change and brain atrophy. 3. No evidence for cervical spine fracture. 4. Cervical degenerative disc disease. Electronically Signed   By: Kerby Moors M.D.   On: 05/01/2019 11:36   Ct Cervical Spine Wo Contrast  Result Date: 05/01/2019 CLINICAL DATA:  Fall. On anticoagulation. EXAM: CT HEAD WITHOUT CONTRAST CT CERVICAL SPINE WITHOUT CONTRAST TECHNIQUE: Multidetector CT imaging of the head and cervical spine was performed following the standard protocol without intravenous contrast. Multiplanar CT image reconstructions of the cervical spine were also generated. COMPARISON:  MRI brain 12/02/2018 FINDINGS: CT HEAD FINDINGS Brain: No evidence of acute infarction, hemorrhage, hydrocephalus, extra-axial collection or mass lesion/mass effect. There is mild diffuse low-attenuation within the subcortical and periventricular white matter compatible with chronic microvascular disease. Prominence of the sulci and ventricles compatible with brain atrophy. Vascular: No hyperdense vessel or unexpected calcification. Skull: Normal. Negative for fracture or focal lesion. Sinuses/Orbits: No acute finding. Other: None. CT CERVICAL SPINE FINDINGS Alignment: Normal. Skull base and vertebrae: No acute fracture. No primary bone lesion or focal pathologic process. Soft tissues and spinal canal: No prevertebral fluid or swelling. No visible canal hematoma. Disc levels: Multi level disc space narrowing and endplate spurring is identified throughout the  cervical spine. Upper chest: Negative. Other: None IMPRESSION: 1. No acute intracranial abnormalities. 2. Chronic small vessel ischemic change and brain atrophy. 3. No evidence for cervical spine fracture. 4. Cervical degenerative disc disease. Electronically Signed   By: Kerby Moors M.D.   On: 05/01/2019 11:36    Procedures Procedures (including critical care time)  Medications Ordered in ED Medications - No data to display   Initial Impression / Assessment and Plan / ED Course  I have reviewed the triage vital signs and the nursing notes.  Pertinent labs & imaging results that were available during my care of the patient were reviewed by me and considered in my medical decision making (see chart for details).        Patient presents sent from skilled nursing facility for evaluation after fall.  He is afebrile, intermittently hypertensive in the ED but vital signs overall stable and he is quite well-appearing.  He is nontoxic in appearance.  No focal neurologic deficits noted.  No evidence of serious head injury, no midline spine tenderness, no evidence of serious trauma to the chest or abdomen.  However with his history of dementia he is an unreliable historian and we will obtain CT of the head and neck to rule out acute abnormality to the head or cervical spine.  He has also been pulling out his Foley catheter per his facility.  On chart review there is a note from his gerontologist that states that if a new Foley catheter cannot be placed then he can be switched to regular I&O cathing instead.  Lab work reviewed by me shows no leukocytosis, no anemia, no metabolic derangements.  He has a chronic leukopenia which appears to be at baseline.  Also has very mild elevation in his BUN and creatinine which could be secondary to mild dehydration but he is tolerating p.o. intake in the ED.  UA difficult to analyze due to blood which is likely coming from the urethral meatus due to pulling out his  Foley catheter, though does show many bacteria and greater than 50 WBCs on microscopy.  However in the absence of leukocytosis, suprapubic tenderness on examination, or complaint of urinary symptoms or dysuria this is likely colonized.  We will hold off on treating for UTI at this time and instead will elect for culture.  Imaging today shows no evidence of acute intracranial abnormality or cervical spine injury.  2:17 PM I spoke with the patient's wife Maverick Ponting on the phone.  She states that the patient was living with her in independent living but on Thursday was switched to the skilled nursing portion of their facility due to worsening dementia and worsening difficulty with ambulation.  She states that he has had a slow gradual decline over the last several months/year.  He was seen and evaluated by his PCP yesterday.  He had a Foley catheter placed 10 to 14 days ago due to urinary retention; she states that previously he had been I&O cathing for years but was having difficulty with this so the Foley catheter was placed.  Patient sees Dr. Diona Fanti with urology and has an appointment to follow-up with him on November 6.  She feels that he is receiving excellent care at his skilled nursing facility and feels comfortable for him to be discharged back there.  Also notes that he is at his mental status baseline.  On reevaluation patient is resting comfortably no apparent distress.  Repeat physical examination is reassuring.  He feels comfortable with discharge back to his facility.  Discussed strict ED return precautions.  Patient verbalized understanding of and agreement with plan and patient stable for discharge home at this time.  Patient was also seen and evaluated Dr. Sedonia Small who agrees with assessment and plan at this time.  Final Clinical Impressions(s) / ED Diagnoses   Final diagnoses:  Fall, initial encounter  Problem with Foley catheter, initial encounter [facility])    ED Discharge Orders     None       Renita Papa, PA-C 05/01/19 1635    Maudie Flakes, MD 05/03/19 [phone]

## 2019-05-01 NOTE — ED Triage Notes (Signed)
Pt. Has dementia, oriented x 4.

## 2019-05-01 NOTE — ED Notes (Signed)
Pt wife [name] [phone]

## 2019-05-01 NOTE — ED Notes (Signed)
All appropriate discharge materials reviewed at length with patient. Time for questions provided. Pt has no other questions at this time and verbalizes understanding of all provided materials.  

## 2019-05-01 NOTE — ED Notes (Signed)
Report given to david Rn at [facility].

## 2019-05-01 NOTE — ED Notes (Signed)
Patient assisted from wheelchair onto bed with 2 person assist. Patient having bleeding continued from penis.

## 2019-05-02 DIAGNOSIS — M62562 Muscle wasting and atrophy, not elsewhere classified, left lower leg: Secondary | ICD-10-CM | POA: Diagnosis not present

## 2019-05-02 DIAGNOSIS — R278 Other lack of coordination: Secondary | ICD-10-CM | POA: Diagnosis not present

## 2019-05-02 DIAGNOSIS — R2689 Other abnormalities of gait and mobility: Secondary | ICD-10-CM | POA: Diagnosis not present

## 2019-05-02 DIAGNOSIS — M62561 Muscle wasting and atrophy, not elsewhere classified, right lower leg: Secondary | ICD-10-CM | POA: Diagnosis not present

## 2019-05-03 ENCOUNTER — Encounter: Payer: Self-pay | Admitting: Internal Medicine

## 2019-05-03 ENCOUNTER — Non-Acute Institutional Stay (SKILLED_NURSING_FACILITY): Payer: Medicare Other | Admitting: Internal Medicine

## 2019-05-03 DIAGNOSIS — R338 Other retention of urine: Secondary | ICD-10-CM

## 2019-05-03 DIAGNOSIS — I10 Essential (primary) hypertension: Secondary | ICD-10-CM | POA: Diagnosis not present

## 2019-05-03 DIAGNOSIS — R601 Generalized edema: Secondary | ICD-10-CM | POA: Diagnosis not present

## 2019-05-03 DIAGNOSIS — R310 Gross hematuria: Secondary | ICD-10-CM | POA: Diagnosis not present

## 2019-05-03 DIAGNOSIS — I5032 Chronic diastolic (congestive) heart failure: Secondary | ICD-10-CM | POA: Diagnosis not present

## 2019-05-03 DIAGNOSIS — Z7901 Long term (current) use of anticoagulants: Secondary | ICD-10-CM | POA: Diagnosis not present

## 2019-05-03 DIAGNOSIS — R410 Disorientation, unspecified: Secondary | ICD-10-CM | POA: Diagnosis not present

## 2019-05-03 DIAGNOSIS — E7849 Other hyperlipidemia: Secondary | ICD-10-CM

## 2019-05-03 DIAGNOSIS — F0150 Vascular dementia without behavioral disturbance: Secondary | ICD-10-CM | POA: Diagnosis not present

## 2019-05-03 DIAGNOSIS — R2689 Other abnormalities of gait and mobility: Secondary | ICD-10-CM | POA: Diagnosis not present

## 2019-05-03 DIAGNOSIS — N2889 Other specified disorders of kidney and ureter: Secondary | ICD-10-CM

## 2019-05-03 DIAGNOSIS — N401 Enlarged prostate with lower urinary tract symptoms: Secondary | ICD-10-CM | POA: Diagnosis not present

## 2019-05-03 DIAGNOSIS — R8271 Bacteriuria: Secondary | ICD-10-CM | POA: Diagnosis not present

## 2019-05-03 DIAGNOSIS — M62562 Muscle wasting and atrophy, not elsewhere classified, left lower leg: Secondary | ICD-10-CM | POA: Diagnosis not present

## 2019-05-03 DIAGNOSIS — R278 Other lack of coordination: Secondary | ICD-10-CM | POA: Diagnosis not present

## 2019-05-03 DIAGNOSIS — I4891 Unspecified atrial fibrillation: Secondary | ICD-10-CM | POA: Diagnosis not present

## 2019-05-03 DIAGNOSIS — M6389 Disorders of muscle in diseases classified elsewhere, multiple sites: Secondary | ICD-10-CM | POA: Diagnosis not present

## 2019-05-03 DIAGNOSIS — F0280 Dementia in other diseases classified elsewhere without behavioral disturbance: Secondary | ICD-10-CM | POA: Diagnosis not present

## 2019-05-03 DIAGNOSIS — R41841 Cognitive communication deficit: Secondary | ICD-10-CM | POA: Diagnosis not present

## 2019-05-03 DIAGNOSIS — M62561 Muscle wasting and atrophy, not elsewhere classified, right lower leg: Secondary | ICD-10-CM | POA: Diagnosis not present

## 2019-05-03 LAB — URINE CULTURE: Culture: >=100000 — AB

## 2019-05-03 NOTE — Progress Notes (Signed)
Patient ID: Peter Singh, male   DOB: [birth year], 85 y.o.   MRN: [id number]   Provider:  Rexene Edison. Mariea Clonts, D.O., C.M.D. Location:  [facility] Room Number: 142-A Place of Service:  SNF (31)  PCP: Gayland Curry, DO Patient Care Team: Gayland Curry, DO as PCP - General (Geriatric Medicine) [name], MD as PCP - Cardiology (Cardiology)  Extended Emergency Contact Information Primary Emergency Contact: Wojtkiewicz,[name] Address: [address]          [place], [place] [address] [name] of [place] Phone: [phone] Mobile Phone: [phone] Relation: Spouse Secondary Emergency Contact: Walberg,Kanoa Address: [place] [address] [name] of [place] Phone: [phone] Mobile Phone: [phone] Relation: Son  Code Status: DNR; has a MOST that needs to be readdressed now that he's in health care:  Reads DNR, limited additional interventions, determine abx, trial IVFs, trial feeding tube  Goals of Care: Advanced Directive information Advanced Directives 05/03/2019  Does Patient Have a Medical Advance Directive? Yes  Type of Advance Directive Out of facility DNR (pink MOST or yellow form);Living will  Does patient want to make changes to medical advance directive? No - Patient declined  Copy of [place] in Chart? -  Pre-existing out of facility DNR order (yellow form or pink MOST form) Yellow form placed in chart (order not valid for inpatient use);Pink MOST form placed in chart (order not valid for inpatient use)    Chief Complaint  Patient presents with  . New Admit To SNF    New Admission at [facility]     HPI: Patient is a 85 y.o. male seen today for admission to [facility] for long term care.  He has a h/o dementia, recurrent falls, chronic diastolic heart failure, peripheral neuropathy, thrombocytopenia, htn, hyperlipidemia,  right renal mass and urinary retention due to BPH.  He's been in rehab on several prior occasions and has been confused there, as well.  He was admitted 10/22 after some visits by [facility] clinic nurse where his wife had expressed increased caregiver burnout.  Resident was having falls and also increased issues with skin breakdown and candidal infections with his incontinence.  Since coming, he's been confused and has pulled out the foley on 3 occasions.  First 10/22, then 10/24 at which time I was called and advised that if he pulls it out again, to do I/O caths b/c he's at less risk of infection with that and he keeps traumatizing his urethra.  Unfortunately, the 3rd time, he apparently pulled it out at his bed but then made his way to the restroom and fell by the sink--he c/o neck pain--this was all on 10/25 and was sent out due to that hand continued bleeding from his penis.  Fortunately, no cervical fx was identified on imaging at the ED after on call sent him out.  He does have cervical degenerative disc disease (neck arthritis).  The CT does note chronic small vessel ischemic change and brain atrophy appropriate with his dementia.  INR at the ED was 2.3.  Urine culture done at the ED did show E coli >100K.  He has no clear urinary tract symptoms--no suprapubic or flank pain or tenderness, no fever, no pain in his penis that he is aware of or admits to at this time.  His catheter is back in place and still has bloody  urine.  He is to see his urologist coming up for further management.  Date was not in chart.  He had no urinary symptoms he would admit to when seen.  He just kept saying he wound up over there when he came for pictures and his wife had something to do and there was some kind of misunderstanding that he was still here.  He had his MMSE done 10/26 which showed 21/30 and failed clock drawing.    His coumadin for afib has been managed by Dr. Daneen Schick at cardiology per some orders we received  but, when we contacted his office to notify them that we would take that over at the nursing home, they said it had been a long time since they'd managed his INR though a prescription in his chart said the order came from cardiology. His next INR is due 11/12 unless he winds up on antibiotics and we need to check sooner.    His toenails have been noted to be thick and bloody around them.  He has dry scaly skin and seborrhea.  Past Medical History:  Diagnosis Date  . Adult failure to thrive    Per incoming records from [facility]  . Allergic rhinitis    Per incoming records from [facility]  . Atrial fibrillation ([place])   . BPH (benign prostatic hyperplasia)    Per incoming records from [facility]  . Cancer of kidney [facility])    Right, Per incoming records from [facility]  . Cataract    Per incoming records from [facility]  . Cerebrovascular accident, late effects    Per incoming records from [facility]  . Chronic kidney disease, stage III (moderate)    Per incoming records from [facility]  . Claudication [facility])    Per incoming records from [facility]  . Cognitive impairment    Mild, Per incoming records from [facility]  . Constipation    Per incoming records from [facility]  . Dementia ([place])   . Diarrhea    Better off Aricept, Per incoming records from [facility]  . Diastolic dysfunction    on 2D Echo 07/2009 and 2016, Per incoming records from [facility]  . Diverticulosis    Mild, Per incoming records from [facility]  . Diverticulosis of colon (without mention of hemorrhage)   . ED (erectile dysfunction)    Per incoming records from [facility]  . History of Coumadin therapy    Per incoming records from [facility]  . History of  echocardiogram 09/2014   Per incoming records from [facility]  . Hyperlipemia   . Hypertension   . Kidney mass    Per incoming records from [facility]  . Lower leg edema    Per incoming records from [facility]  . Melanoma ([place])   . Neuropathy    Per incoming records from [facility]  . OA (osteoarthritis)    Per incoming records from [facility]  . Peripheral neuropathy    Per incoming records from [facility]  . Personal history of colonic polyps 1999 & 2004   adenomatous polyps  . Pulmonary arterial hypertension ([place])    Per incoming records from [facility]  . Rosacea    Per incoming records from [facility]  . Thrombocytopenia ([place]) 2017   Per incoming records from  [facility]  . UTI (urinary tract infection)    Sepsis, Per incoming records from [facility]  . Ventricular hypertrophy   . Walker as ambulation aid    Per incoming records from [facility]   Past Surgical History:  Procedure Laterality Date  . APPENDECTOMY     Per incoming records from [facility]  . CATARACT EXTRACTION     Per incoming records from [facility]  . KNEE SURGERY     right  . PILONIDAL CYST DRAINAGE    . POLYPECTOMY     Per incoming records from [facility]  . schwanoma tumor lumbar spine    . SPINE SURGERY     tumor removed  . THORACIC 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, Per incoming records from [facility]  . TONSILLECTOMY AND ADENOIDECTOMY      reports that he quit smoking about 43 years ago. His smoking use included cigarettes. He smoked 3.00 packs per day. He has never used smokeless tobacco. He reports current alcohol use. He reports that he does not use drugs. Social History   Socioeconomic History  . Marital  status: Married    Spouse name: Not on file  . Number of children: 2  . Years of education: Not on file  . Highest education level: Bachelor's degree (e.g., BA, AB, BS)  Occupational History  . Occupation: retired  Scientific laboratory technician  . Financial resource strain: Not on file  . Food insecurity    Worry: Not on file    Inability: Not on file  . Transportation needs    Medical: Not on file    Non-medical: Not on file  Tobacco Use  . Smoking status: Former Smoker    Packs/day: 3.00    Types: Cigarettes    Quit date: 06/09/1975    Years since quitting: 43.9  . Smokeless tobacco: Never Used  Substance and Sexual Activity  . Alcohol use: Yes    Alcohol/week: 0.0 standard drinks    Comment: 2 drinks per day  . Drug use: No  . Sexual activity: Not on file  Lifestyle  . Physical activity    Days per week: Not on file    Minutes per session: Not on file  . Stress: Not on file  Relationships  . Social Herbalist on phone: Not on file    Gets together: Not on file    Attends religious service: Not on file    Active member of club or organization: Not on file    Attends meetings of clubs or organizations: Not on file    Relationship status: Not on file  . Intimate partner violence    Fear of current or ex partner: Not on file    Emotionally abused: Not on file    Physically abused: Not on file    Forced sexual activity: Not on file  Other Topics Concern  . Not on file  Social History Narrative   Patient is married with 2 children, 3 grandchildren    Patient is retired Engineer, maintenance (IT)   Patient lives at [place]:  dependent in [place] History  Problem Relation Age of Onset  . Stroke Father   . CVA Father   . Tuberculosis Father   . Neuropathy Neg Hx     Health Maintenance  Topic Date Due  . INFLUENZA VACCINE  02/05/2019  .  TETANUS/TDAP  01/06/2022  . PNA vac Low Risk Adult  Completed    Allergies  Allergen Reactions  . Penicillins  Rash    Has patient had a PCN reaction causing immediate rash, facial/tongue/throat swelling, SOB or lightheadedness with hypotension: unknown Has patient had a PCN reaction causing severe rash involving mucus membranes or skin necrosis: unknown Has patient had a PCN reaction that required hospitalization : unknown Has patient had a PCN reaction occurring within the last 10 years: no If all of the above answers are "NO", then may proceed with Cephalosporin use.     Outpatient Encounter Medications as of 05/03/2019  Medication Sig  . acetaminophen (TYLENOL) 500 MG tablet Take 500 mg by mouth 2 (two) times daily. Scheduled and every 4 hours as needed (ending 05/05/2019)Document area of discomfort and effectiveness of medication.  Marland Kitchen atorvastatin (LIPITOR) 20 MG tablet Take 20 mg by mouth every evening.   . bisacodyl (DULCOLAX) 10 MG suppository Place 10 mg rectally as needed for moderate constipation. X 2 days when needed  . cholecalciferol (VITAMIN D3) 25 MCG (1000 UT) tablet Take 2,000 Units by mouth daily.   . DULoxetine (CYMBALTA) 30 MG capsule Take 30 mg by mouth daily.  . hydrochlorothiazide (MICROZIDE) 12.5 MG capsule Take 12.5 mg by mouth every other day.  . methenamine (MANDELAMINE) 1 g tablet Take 1,000 mg by mouth 2 (two) times daily.  Marland Kitchen warfarin (COUMADIN) 7.5 MG tablet Take 7.5 mg by mouth daily. EXCEPT 1/2 tablet on Mon, Tue, Thu, and Sat  . [DISCONTINUED] gabapentin (NEURONTIN) 300 MG capsule Take 300 mg by mouth at bedtime as needed (pain).  . [DISCONTINUED] thiamine 100 MG tablet Take 1 tablet (100 mg total) by mouth daily.  . [DISCONTINUED] vitamin C (ASCORBIC ACID) 500 MG tablet Take 500 mg by mouth 2 (two) times daily.  . [DISCONTINUED] warfarin (COUMADIN) 7.5 MG tablet TAKE 1 TABLET DAILY OR AS DIRECTED.   No facility-administered encounter medications on file as of 05/03/2019.     Review of Systems  Constitutional: Negative for activity change, appetite change, chills  and fever.  HENT: Negative for congestion and sore throat.   Eyes: Negative for visual disturbance.  Respiratory: Negative for chest tightness and shortness of breath.   Cardiovascular: Positive for leg swelling. Negative for chest pain and palpitations.  Gastrointestinal: Negative for abdominal pain, constipation, diarrhea, nausea and vomiting.  Genitourinary: Positive for hematuria. Negative for decreased urine volume and dysuria.       Foley in place with dark orange-red urine  Musculoskeletal: Positive for gait problem. Negative for arthralgias and back pain.  Skin: Negative for color change.       Scaly areas on his scalp and legs    Vitals:   05/03/19 1044  BP: 131/71  Pulse: 77  Resp: 19  Temp: 98.3 F (36.8 C)  SpO2: 97%  Weight: 290 lb (131.5 kg)  Height: '6\' 7"'  (2.007 m)   Body mass index is 32.67 kg/m. Physical Exam Vitals signs and nursing note reviewed.  Constitutional:      General: He is not in acute distress.    Appearance: He is obese. He is not toxic-appearing.  HENT:     Head: Normocephalic and atraumatic.     Right Ear: External ear normal.     Left Ear: External ear normal.     Nose: Nose normal.     Mouth/Throat:     Pharynx: Oropharynx is clear.  Eyes:     Extraocular Movements:  Extraocular movements intact.     Pupils: Pupils are equal, round, and reactive to light.  Neck:     Musculoskeletal: Neck supple.  Cardiovascular:     Rate and Rhythm: Rhythm irregular.     Heart sounds: No murmur.  Pulmonary:     Effort: Pulmonary effort is normal.     Breath sounds: Normal breath sounds. No wheezing, rhonchi or rales.  Abdominal:     General: Bowel sounds are normal. There is no distension.     Palpations: Abdomen is soft. There is no mass.     Tenderness: There is no abdominal tenderness. There is no guarding or rebound.  Musculoskeletal: Normal range of motion.     Right lower leg: Edema present.     Left lower leg: Edema present.      Comments: Nonpitting edema  Lymphadenopathy:     Cervical: No cervical adenopathy.  Skin:    General: Skin is warm and dry.     Comments: Dry scaly plaques on scalp, lower legs; thickened, discolored toenails with blood around them  Neurological:     General: No focal deficit present.     Mental Status: He is alert. He is disoriented.     Sensory: No sensory deficit.     Gait: Gait abnormal.     Comments: Requiring frequent reorientation and notes being made for him to help this     Labs reviewed: Basic Metabolic Panel: Recent Labs    12/10/18 03/22/19 0808 05/01/19 1037  NA 141 140 140  K 4.4 4.2 4.4  CL  --  104 103  CO2  --  28 25  GLUCOSE  --  206* 178*  BUN 16 17 27*  CREATININE 1.1 1.30* 1.44*  CALCIUM  --  10.3 10.3   Liver Function Tests: Recent Labs    12/10/18 03/22/19 0808 05/01/19 1037  AST '15 17 26  ' ALT '13 15 21  ' ALKPHOS 65 73 65  BILITOT  --  1.2 1.5*  PROT  --  6.4* 6.8  ALBUMIN  --  4.1 4.0   No results for input(s): LIPASE, AMYLASE in the last 8760 hours. No results for input(s): AMMONIA in the last 8760 hours. CBC: Recent Labs    03/22/19 0808 05/01/19 1037  WBC 2.3* 3.5*  NEUTROABS 1.1* 2.4  HGB 16.0 15.5  HCT 48.2 47.4  MCV 95.4 95.4  PLT 82* PLATELET CLUMPS NOTED ON SMEAR, COUNT APPEARS DECREASED   Cardiac Enzymes: No results for input(s): CKTOTAL, CKMB, CKMBINDEX, TROPONINI in the last 8760 hours. BNP: Invalid input(s): POCBNP Lab Results  Component Value Date   HGBA1C 6.0 (H) 07/19/2014   Lab Results  Component Value Date   TSH 1.080 07/04/2014   Lab Results  Component Value Date   VITAMINB12 386 11/05/2016   Lab Results  Component Value Date   FOLATE >20.0 11/05/2016   No results found for: IRON, TIBC, FERRITIN  Imaging and Procedures obtained prior to SNF admission: Ct Head Wo Contrast  Result Date: 05/01/2019 CLINICAL DATA:  Fall. On anticoagulation. EXAM: CT HEAD WITHOUT CONTRAST CT CERVICAL SPINE WITHOUT  CONTRAST TECHNIQUE: Multidetector CT imaging of the head and cervical spine was performed following the standard protocol without intravenous contrast. Multiplanar CT image reconstructions of the cervical spine were also generated. COMPARISON:  MRI brain 12/02/2018 FINDINGS: CT HEAD FINDINGS Brain: No evidence of acute infarction, hemorrhage, hydrocephalus, extra-axial collection or mass lesion/mass effect. There is mild diffuse low-attenuation within the subcortical and periventricular white  matter compatible with chronic microvascular disease. Prominence of the sulci and ventricles compatible with brain atrophy. Vascular: No hyperdense vessel or unexpected calcification. Skull: Normal. Negative for fracture or focal lesion. Sinuses/Orbits: No acute finding. Other: None. CT CERVICAL SPINE FINDINGS Alignment: Normal. Skull base and vertebrae: No acute fracture. No primary bone lesion or focal pathologic process. Soft tissues and spinal canal: No prevertebral fluid or swelling. No visible canal hematoma. Disc levels: Multi level disc space narrowing and endplate spurring is identified throughout the cervical spine. Upper chest: Negative. Other: None IMPRESSION: 1. No acute intracranial abnormalities. 2. Chronic small vessel ischemic change and brain atrophy. 3. No evidence for cervical spine fracture. 4. Cervical degenerative disc disease. Electronically Signed   By: Kerby Moors M.D.   On: 05/01/2019 11:36   Ct Cervical Spine Wo Contrast  Result Date: 05/01/2019 CLINICAL DATA:  Fall. On anticoagulation. EXAM: CT HEAD WITHOUT CONTRAST CT CERVICAL SPINE WITHOUT CONTRAST TECHNIQUE: Multidetector CT imaging of the head and cervical spine was performed following the standard protocol without intravenous contrast. Multiplanar CT image reconstructions of the cervical spine were also generated. COMPARISON:  MRI brain 12/02/2018 FINDINGS: CT HEAD FINDINGS Brain: No evidence of acute infarction, hemorrhage,  hydrocephalus, extra-axial collection or mass lesion/mass effect. There is mild diffuse low-attenuation within the subcortical and periventricular white matter compatible with chronic microvascular disease. Prominence of the sulci and ventricles compatible with brain atrophy. Vascular: No hyperdense vessel or unexpected calcification. Skull: Normal. Negative for fracture or focal lesion. Sinuses/Orbits: No acute finding. Other: None. CT CERVICAL SPINE FINDINGS Alignment: Normal. Skull base and vertebrae: No acute fracture. No primary bone lesion or focal pathologic process. Soft tissues and spinal canal: No prevertebral fluid or swelling. No visible canal hematoma. Disc levels: Multi level disc space narrowing and endplate spurring is identified throughout the cervical spine. Upper chest: Negative. Other: None IMPRESSION: 1. No acute intracranial abnormalities. 2. Chronic small vessel ischemic change and brain atrophy. 3. No evidence for cervical spine fracture. 4. Cervical degenerative disc disease. Electronically Signed   By: Kerby Moors M.D.   On: 05/01/2019 11:36    Assessment/Plan 1. Delirium -seems this has been improving as time goes on here -due to change in location--has no other s/s of true UTI -? Amount of alcohol intake at home prior to admission  2. Vascular dementia without behavioral disturbance ([place]) -has been progressing and his wife no longer is able to care for him at home due to his stature, falls, and progressing ADL needs -he's not on dementia medications -prior PCP notes indicate thinking vascular vs. Frontotemporal dementia--I have not seen the disinhibition or behavioral concerns, but I have only met him briefly on a few occasions so we will monitor and may need to change the diagnosis thought it will not affect tx  3. Chronic diastolic heart failure (HCC) -no signs of exacerbation, only on hctz qod  -?benefit from compression socks and being off hctz -if having chf,  would do better with torsemide, perhaps  4. Renal mass, right -last [place] of abdomen in epic with solid-appearing lesion off the upper pole of the right kidney measures 2.9 x 2.4 x 2.1 cm compatible with the solid lesions seen on prior CT 03/30/17--has been followed by [name] for thrombocytopenia in the past -follows with urology -when seen in rehab previously, there were no plans for further investigation or tx  5. Urinary retention due to benign prostatic hyperplasia -was admitted with a foley when he'd been doing I/O caths  himself at home (which I'd imagine was becoming more challenging and less hygienic with his cognitive loss)--he has pulled it out three times now with balloon inflated and has been having traumatic hematuria as a result -of course, when he went to the ED, a UA c+s was done which grew out pansensitive E coli.   -he has no symptoms to report, is afebrile without suprapubic, abdominal or flank tenderness -I opted not to treat due to this and improvements in his cognition -note that the result was incidentally found in the chart  6. Gross hematuria -due to trauma from pulling out foley x 3 with balloon inflated -staff have secured it better with a device and leg bag -his mentation is improving and he's adjusting so, hopefully, this will not recur -he is to f/u with urology  7. Atrial fibrillation, unspecified type ([place]) -continues on afib 7.79m daily except 1/2 tablet on mon, tues, thurs, and sat -next INR 11/12  8. Chronic anticoagulation -for afib with goal 2-3  9. Essential hypertension -bp well controlled with hctz qod  10. Other hyperlipidemia -remains on lipitor 245mdaily for this  11. Bacteriuria -noted on urine culture in ED presumably done due to his cognition and his hematuria though he'd had trauma  Family/ staff Communication: discussed with snf nurse  Labs/tests ordered:  No new; need to readdress MOST at care plan meeting  TiMetamoraReed,  D.O. [facility] [address] [place] [address] Phone (Mon-Fri 8am-5pm):  [phone] Call:  [phone] follow prompts after 5pm & weekends Office Phone:  [phone] Fax:  [phone]

## 2019-05-04 ENCOUNTER — Telehealth: Payer: Self-pay | Admitting: Internal Medicine

## 2019-05-04 ENCOUNTER — Telehealth: Payer: Self-pay | Admitting: *Deleted

## 2019-05-04 DIAGNOSIS — R278 Other lack of coordination: Secondary | ICD-10-CM | POA: Diagnosis not present

## 2019-05-04 DIAGNOSIS — M6389 Disorders of muscle in diseases classified elsewhere, multiple sites: Secondary | ICD-10-CM | POA: Diagnosis not present

## 2019-05-04 DIAGNOSIS — M62561 Muscle wasting and atrophy, not elsewhere classified, right lower leg: Secondary | ICD-10-CM | POA: Diagnosis not present

## 2019-05-04 DIAGNOSIS — R2689 Other abnormalities of gait and mobility: Secondary | ICD-10-CM | POA: Diagnosis not present

## 2019-05-04 DIAGNOSIS — R41841 Cognitive communication deficit: Secondary | ICD-10-CM | POA: Diagnosis not present

## 2019-05-04 DIAGNOSIS — M62562 Muscle wasting and atrophy, not elsewhere classified, left lower leg: Secondary | ICD-10-CM | POA: Diagnosis not present

## 2019-05-04 NOTE — Telephone Encounter (Signed)
Called and spoke with Sunday Spillers [phone] with Dr. Grayland Ormond office and she stated that the last Coumadin Check for patient was 2017 but she will make note of message.  Dr. Mariea Clonts aware.

## 2019-05-04 NOTE — Telephone Encounter (Signed)
Please call Dr. Grayland Ormond office and let them know that Peter Singh is now in SNF and will be a long-term resident.  Typically, these folks do not go out for INR checks once they are admitted to long-term care and [facility] is happy to manage his coumadin here.  I see his next INR is 11/12 and we can take care of that.    [name], D.O. [facility] [address], [place] [address] Cell Phone (Mon-Fri 8am-5pm):  [phone] On Call:  [phone] & follow prompts after 5pm & weekends Office Phone:  [phone] Office Fax:  [phone]

## 2019-05-04 NOTE — Telephone Encounter (Signed)
Post ED Visit - Positive Culture Follow-up  Culture report reviewed by antimicrobial stewardship pharmacist: [place] Team []  Elenor Quinones, Pharm.D. []  Heide Guile, Pharm.D., BCPS AQ-ID []  Parks Neptune, Pharm.D., BCPS []  Alycia Rossetti, Pharm.D., BCPS []  [place], Pharm.D., BCPS, AAHIVP []  Legrand Como, Pharm.D., BCPS, AAHIVP [x]  Salome Arnt, PharmD, BCPS []  Johnnette Gourd, PharmD, BCPS []  Hughes Better, PharmD, BCPS []  Leeroy Cha, PharmD []  Laqueta Linden, PharmD, BCPS []  Albertina Parr, PharmD  [place] Team []  Leodis Sias, PharmD []  Lindell Spar, PharmD []  Royetta Asal, PharmD []  Graylin Shiver, Rph []  Rema Fendt) Glennon Mac, PharmD []  Arlyn Dunning, PharmD []  Netta Cedars, PharmD []  Dia Sitter, PharmD []  Leone Haven, PharmD []  Gretta Arab, PharmD []  Theodis Shove, PharmD []  Peggyann Juba, PharmD []  Reuel Boom, PharmD   Positive urine culture No UTI symptoms, likely colonized and no further patient follow-up is required at this time.  Harlon Flor [facility] 05/04/2019, 11:47 AM

## 2019-05-05 DIAGNOSIS — R278 Other lack of coordination: Secondary | ICD-10-CM | POA: Diagnosis not present

## 2019-05-05 DIAGNOSIS — R2689 Other abnormalities of gait and mobility: Secondary | ICD-10-CM | POA: Diagnosis not present

## 2019-05-05 DIAGNOSIS — M6389 Disorders of muscle in diseases classified elsewhere, multiple sites: Secondary | ICD-10-CM | POA: Diagnosis not present

## 2019-05-05 DIAGNOSIS — M62562 Muscle wasting and atrophy, not elsewhere classified, left lower leg: Secondary | ICD-10-CM | POA: Diagnosis not present

## 2019-05-05 DIAGNOSIS — R41841 Cognitive communication deficit: Secondary | ICD-10-CM | POA: Diagnosis not present

## 2019-05-05 DIAGNOSIS — M62561 Muscle wasting and atrophy, not elsewhere classified, right lower leg: Secondary | ICD-10-CM | POA: Diagnosis not present

## 2019-05-06 DIAGNOSIS — R278 Other lack of coordination: Secondary | ICD-10-CM | POA: Diagnosis not present

## 2019-05-06 DIAGNOSIS — M62561 Muscle wasting and atrophy, not elsewhere classified, right lower leg: Secondary | ICD-10-CM | POA: Diagnosis not present

## 2019-05-06 DIAGNOSIS — R41841 Cognitive communication deficit: Secondary | ICD-10-CM | POA: Diagnosis not present

## 2019-05-06 DIAGNOSIS — M6389 Disorders of muscle in diseases classified elsewhere, multiple sites: Secondary | ICD-10-CM | POA: Diagnosis not present

## 2019-05-06 DIAGNOSIS — R2689 Other abnormalities of gait and mobility: Secondary | ICD-10-CM | POA: Diagnosis not present

## 2019-05-06 DIAGNOSIS — M62562 Muscle wasting and atrophy, not elsewhere classified, left lower leg: Secondary | ICD-10-CM | POA: Diagnosis not present

## 2019-05-07 DIAGNOSIS — M6389 Disorders of muscle in diseases classified elsewhere, multiple sites: Secondary | ICD-10-CM | POA: Diagnosis not present

## 2019-05-07 DIAGNOSIS — R8271 Bacteriuria: Secondary | ICD-10-CM | POA: Insufficient documentation

## 2019-05-07 DIAGNOSIS — F0150 Vascular dementia without behavioral disturbance: Secondary | ICD-10-CM | POA: Insufficient documentation

## 2019-05-07 DIAGNOSIS — R2689 Other abnormalities of gait and mobility: Secondary | ICD-10-CM | POA: Diagnosis not present

## 2019-05-07 DIAGNOSIS — M62562 Muscle wasting and atrophy, not elsewhere classified, left lower leg: Secondary | ICD-10-CM | POA: Diagnosis not present

## 2019-05-07 DIAGNOSIS — R278 Other lack of coordination: Secondary | ICD-10-CM | POA: Diagnosis not present

## 2019-05-07 DIAGNOSIS — M62561 Muscle wasting and atrophy, not elsewhere classified, right lower leg: Secondary | ICD-10-CM | POA: Diagnosis not present

## 2019-05-07 DIAGNOSIS — R41841 Cognitive communication deficit: Secondary | ICD-10-CM | POA: Diagnosis not present

## 2019-05-09 ENCOUNTER — Non-Acute Institutional Stay (SKILLED_NURSING_FACILITY): Payer: Medicare Other | Admitting: Adult Health

## 2019-05-09 ENCOUNTER — Encounter: Payer: Self-pay | Admitting: Adult Health

## 2019-05-09 DIAGNOSIS — R2689 Other abnormalities of gait and mobility: Secondary | ICD-10-CM | POA: Diagnosis not present

## 2019-05-09 DIAGNOSIS — R338 Other retention of urine: Secondary | ICD-10-CM | POA: Diagnosis not present

## 2019-05-09 DIAGNOSIS — Z9189 Other specified personal risk factors, not elsewhere classified: Secondary | ICD-10-CM | POA: Diagnosis not present

## 2019-05-09 DIAGNOSIS — I4891 Unspecified atrial fibrillation: Secondary | ICD-10-CM

## 2019-05-09 DIAGNOSIS — R278 Other lack of coordination: Secondary | ICD-10-CM | POA: Diagnosis not present

## 2019-05-09 DIAGNOSIS — I6789 Other cerebrovascular disease: Secondary | ICD-10-CM | POA: Diagnosis not present

## 2019-05-09 DIAGNOSIS — N401 Enlarged prostate with lower urinary tract symptoms: Secondary | ICD-10-CM | POA: Diagnosis not present

## 2019-05-09 DIAGNOSIS — G6289 Other specified polyneuropathies: Secondary | ICD-10-CM | POA: Diagnosis not present

## 2019-05-09 DIAGNOSIS — R601 Generalized edema: Secondary | ICD-10-CM | POA: Diagnosis not present

## 2019-05-09 DIAGNOSIS — R41841 Cognitive communication deficit: Secondary | ICD-10-CM | POA: Diagnosis not present

## 2019-05-09 DIAGNOSIS — F0150 Vascular dementia without behavioral disturbance: Secondary | ICD-10-CM | POA: Diagnosis not present

## 2019-05-09 DIAGNOSIS — M62562 Muscle wasting and atrophy, not elsewhere classified, left lower leg: Secondary | ICD-10-CM | POA: Diagnosis not present

## 2019-05-09 DIAGNOSIS — M6389 Disorders of muscle in diseases classified elsewhere, multiple sites: Secondary | ICD-10-CM | POA: Diagnosis not present

## 2019-05-09 DIAGNOSIS — I10 Essential (primary) hypertension: Secondary | ICD-10-CM

## 2019-05-09 DIAGNOSIS — F0280 Dementia in other diseases classified elsewhere without behavioral disturbance: Secondary | ICD-10-CM | POA: Diagnosis not present

## 2019-05-09 DIAGNOSIS — M62561 Muscle wasting and atrophy, not elsewhere classified, right lower leg: Secondary | ICD-10-CM | POA: Diagnosis not present

## 2019-05-09 NOTE — Progress Notes (Signed)
Location:  Occupational psychologist of Service:  SNF (31) Provider:   Cindi Carbon, ANP [place] [phone]  Gayland Curry, DO  Patient Care Team: Gayland Curry, DO as PCP - General (Geriatric Medicine) [name], MD as PCP - Cardiology (Cardiology)  Extended Emergency Contact Information Primary Emergency Contact: Napp,[name] Address: [address]          [place], [place] [address] [name] of [place] Phone: [phone] Mobile Phone: [phone] Relation: Spouse Secondary Emergency Contact: Tome,Altan Address: [place] [address] [name] of [place] Phone: [phone] Mobile Phone: [phone] Relation: Son  Code Status:  DNR Goals of care: Advanced Directive information Advanced Directives 05/03/2019  Does Patient Have a Medical Advance Directive? Yes  Type of Advance Directive Out of facility DNR (pink MOST or yellow form);Living will  Does patient want to make changes to medical advance directive? No - Patient declined  Copy of [place] in Chart? -  Pre-existing out of facility DNR order (yellow form or pink MOST form) Yellow form placed in chart (order not valid for inpatient use);Pink MOST form placed in chart (order not valid for inpatient use)     Chief Complaint  Patient presents with   Acute Visit    f/u admission to skilled care, foley issues    HPI:  Pt is a 85 y.o. male seen today for an acute visit after follow up for a skilled care admission to [facility]. He was admitted due to progressive dementia and caregiver burnout (wife) on 04/28/19 .  He is doing well and has no acute complaints. He is working with therapy and ambulating short distances with a walker. He has foley catheter in place and last week experienced some bleeding due to pulling it out but this has subsided. He denies any abd pain, dysuria,  fever, frequency, back pain, etc. He was in the ER on 10/25 due to a fall with neck pain and a urine was obtained which grew >100,000 colonies of Ecoli. This was not treated as he did not have symptoms. No further issues have developed. He makes attempts to get up without help and is a high fall risk. He has a caregivers with him and fall prec in place.  He is going to see urology this week due to a hx of bph with retention. He previously self catheterized but now has an indwelling foley. He has not pulled at the catheter anymore per the nurse.    Past Medical History:  Diagnosis Date   Adult failure to thrive    Per incoming records from [facility]   Allergic rhinitis    Per incoming records from [facility]   Atrial fibrillation [facility])    BPH (benign prostatic hyperplasia)    Per incoming records from [facility])    Right, Per incoming records from [place]    Per incoming records from [facility]   Cerebrovascular accident, late effects    Per incoming records from [facility]   Chronic kidney disease, stage III (moderate)    Per incoming records from [facility])    Per incoming records from [facility]   Cognitive impairment    Mild, Per incoming records from [facility]  Constipation    Per incoming records from [facility]   Dementia [facility])    Diarrhea    Better off Aricept, Per incoming records from [facility]   Diastolic dysfunction    on 2D Echo 07/2009 and 2016, Per incoming records from [facility]   Diverticulosis    Mild, Per incoming records from [facility]   Diverticulosis of colon (without mention of hemorrhage)    ED (erectile dysfunction)    Per incoming records from [facility]   History of Coumadin therapy    Per incoming records from [facility]   History of echocardiogram 09/2014   Per incoming records from [facility]   Hyperlipemia    Hypertension    Kidney mass    Per incoming records from [place] leg edema    Per incoming records from [facility])    Neuropathy    Per incoming records from [place] (osteoarthritis)    Per incoming records from [facility]   Peripheral neuropathy    Per incoming records from [place] history of colonic [place] & 2004   adenomatous polyps   Pulmonary arterial hypertension ([place])    Per incoming records from [place]    Per incoming records from [facility]   Thrombocytopenia [facility]) 2017   Per incoming records from [facility]   UTI (urinary tract infection)    Sepsis, Per incoming records from [place] hypertrophy    Gilford Rile as ambulation aid    Per incoming records from [facility]   Past Surgical History:  Procedure Laterality Date   APPENDECTOMY     Per incoming records from [place]     Per incoming records from [place]     right   PILONIDAL CYST DRAINAGE     POLYPECTOMY     Per incoming records from [place] tumor lumbar spine     SPINE SURGERY     tumor removed   THORACIC 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, Per incoming records from [place]      Allergies  Allergen Reactions   Penicillins Rash    Has patient had a PCN reaction causing immediate rash, facial/tongue/throat swelling, SOB or  lightheadedness with hypotension: unknown Has patient had a PCN reaction causing severe rash involving mucus membranes or skin necrosis: unknown Has patient had a PCN reaction that required hospitalization : unknown Has patient had a PCN reaction occurring within the last 10 years: no If all of the above answers are "NO", then may proceed with Cephalosporin use.     Outpatient Encounter Medications as of 05/09/2019  Medication Sig   acetaminophen (TYLENOL) 500 MG tablet Take 500 mg by mouth 2 (two) times daily. Scheduled and every 4 hours as needed (ending 05/05/2019)Document area of discomfort and effectiveness of medication.   atorvastatin (LIPITOR) 20 MG tablet Take 20 mg by mouth every evening.    bisacodyl (DULCOLAX) 10 MG suppository Place 10 mg rectally as needed for moderate constipation. X 2 days when needed   cholecalciferol (VITAMIN D3) 25 MCG (1000 UT) tablet Take 2,000 Units by mouth daily.  DULoxetine (CYMBALTA) 30 MG capsule Take 30 mg by mouth daily.   hydrochlorothiazide (MICROZIDE) 12.5 MG capsule Take 12.5 mg by mouth every other day.   methenamine (MANDELAMINE) 1 g tablet Take 1,000 mg by mouth 2 (two) times daily.   warfarin (COUMADIN) 7.5 MG tablet Take 7.5 mg by mouth daily. EXCEPT 1/2 tablet on Mon, Tue, Thu, and Sat   No facility-administered encounter medications on file as of 05/09/2019.     Review of Systems  Constitutional: Negative for activity change, appetite change, chills, diaphoresis, fatigue, fever and unexpected weight change.  Respiratory: Negative for cough, shortness of breath, wheezing and stridor.   Cardiovascular: Positive for leg swelling. Negative for chest pain and palpitations.  Gastrointestinal: Negative for abdominal distention, abdominal pain, constipation and diarrhea.  Genitourinary: Negative for difficulty urinating, discharge, dysuria, flank pain, frequency, hematuria and penile pain.  Musculoskeletal: Positive for gait  problem. Negative for arthralgias, back pain, joint swelling and myalgias.  Neurological: Positive for weakness and numbness (feet). Negative for dizziness, seizures, syncope, facial asymmetry, speech difficulty and headaches.  Hematological: Negative for adenopathy. Does not bruise/bleed easily.  Psychiatric/Behavioral: Positive for confusion. Negative for agitation and behavioral problems.    Immunization History  Administered Date(s) Administered   Influenza-Unspecified 04/17/2014, 05/01/2016, 04/15/2017   Pneumococcal Conjugate-13 04/20/2014   Pneumococcal Polysaccharide-23 07/08/2003, 03/31/2011   Pneumococcal-Unspecified 01/26/2004   Td 08/23/2007   Tdap 01/07/2012   Zoster 07/17/2005   Zoster Recombinat (Shingrix) 10/05/2017, 12/09/2017   Pertinent  Health Maintenance Due  Topic Date Due   INFLUENZA VACCINE  Completed   PNA vac Low Risk Adult  Completed   Fall Risk  05/07/2019 05/03/2019  Risk for fall due to : History of fall(s);Impaired balance/gait;Impaired mobility;Mental status change;Medication side effect History of fall(s);Impaired balance/gait;Impaired mobility;Medication side effect;Mental status change  Follow up Falls evaluation completed;Education provided;Falls prevention discussed Falls evaluation completed;Education provided;Falls prevention discussed  Comment done at [facility] all addressed at [place]:    There were no vitals filed for this visit. There is no height or weight on file to calculate BMI. Physical Exam Vitals signs and nursing note reviewed.  Constitutional:      General: He is not in acute distress.    Appearance: He is not diaphoretic.  HENT:     Head: Normocephalic and atraumatic.  Neck:     Thyroid: No thyromegaly.     Vascular: No JVD.     Trachea: No tracheal deviation.  Cardiovascular:     Rate and Rhythm: Normal rate and regular rhythm.     Heart sounds: No murmur.  Pulmonary:      Effort: Pulmonary effort is normal. No respiratory distress.     Breath sounds: Normal breath sounds. No wheezing.  Abdominal:     General: Bowel sounds are normal. There is no distension.     Palpations: Abdomen is soft.     Tenderness: There is no abdominal tenderness. There is no right CVA tenderness or left CVA tenderness.  Musculoskeletal:     Right lower leg: Edema present.     Left lower leg: Edema present.  Lymphadenopathy:     Cervical: No cervical adenopathy.  Skin:    General: Skin is warm and dry.  Neurological:     General: No focal deficit present.     Mental Status: He is alert. Mental status is at baseline.  Psychiatric:        Mood and Affect: Mood normal.  Labs reviewed: Recent Labs    12/10/18 03/22/19 0808 05/01/19 1037  NA 141 140 140  K 4.4 4.2 4.4  CL  --  104 103  CO2  --  28 25  GLUCOSE  --  206* 178*  BUN 16 17 27*  CREATININE 1.1 1.30* 1.44*  CALCIUM  --  10.3 10.3   Recent Labs    12/10/18 03/22/19 0808 05/01/19 1037  AST 15 17 26   ALT 13 15 21   ALKPHOS 65 73 65  BILITOT  --  1.2 1.5*  PROT  --  6.4* 6.8  ALBUMIN  --  4.1 4.0   Recent Labs    03/22/19 0808 05/01/19 1037  WBC 2.3* 3.5*  NEUTROABS 1.1* 2.4  HGB 16.0 15.5  HCT 48.2 47.4  MCV 95.4 95.4  PLT 82* PLATELET CLUMPS NOTED ON SMEAR, COUNT APPEARS DECREASED   Lab Results  Component Value Date   TSH 1.080 07/04/2014   Lab Results  Component Value Date   HGBA1C 6.0 (H) 07/19/2014   Lab Results  Component Value Date   CHOL 137 05/24/2018   HDL 31 (A) 05/24/2018   LDLCALC 64 05/24/2018   TRIG 210 (A) 05/24/2018    Significant Diagnostic Results in last 30 days:  Ct Head Wo Contrast  Result Date: 05/01/2019 CLINICAL DATA:  Fall. On anticoagulation. EXAM: CT HEAD WITHOUT CONTRAST CT CERVICAL SPINE WITHOUT CONTRAST TECHNIQUE: Multidetector CT imaging of the head and cervical spine was performed following the standard protocol without intravenous contrast.  Multiplanar CT image reconstructions of the cervical spine were also generated. COMPARISON:  MRI brain 12/02/2018 FINDINGS: CT HEAD FINDINGS Brain: No evidence of acute infarction, hemorrhage, hydrocephalus, extra-axial collection or mass lesion/mass effect. There is mild diffuse low-attenuation within the subcortical and periventricular white matter compatible with chronic microvascular disease. Prominence of the sulci and ventricles compatible with brain atrophy. Vascular: No hyperdense vessel or unexpected calcification. Skull: Normal. Negative for fracture or focal lesion. Sinuses/Orbits: No acute finding. Other: None. CT CERVICAL SPINE FINDINGS Alignment: Normal. Skull base and vertebrae: No acute fracture. No primary bone lesion or focal pathologic process. Soft tissues and spinal canal: No prevertebral fluid or swelling. No visible canal hematoma. Disc levels: Multi level disc space narrowing and endplate spurring is identified throughout the cervical spine. Upper chest: Negative. Other: None IMPRESSION: 1. No acute intracranial abnormalities. 2. Chronic small vessel ischemic change and brain atrophy. 3. No evidence for cervical spine fracture. 4. Cervical degenerative disc disease. Electronically Signed   By: Kerby Moors M.D.   On: 05/01/2019 11:36   Ct Cervical Spine Wo Contrast  Result Date: 05/01/2019 CLINICAL DATA:  Fall. On anticoagulation. EXAM: CT HEAD WITHOUT CONTRAST CT CERVICAL SPINE WITHOUT CONTRAST TECHNIQUE: Multidetector CT imaging of the head and cervical spine was performed following the standard protocol without intravenous contrast. Multiplanar CT image reconstructions of the cervical spine were also generated. COMPARISON:  MRI brain 12/02/2018 FINDINGS: CT HEAD FINDINGS Brain: No evidence of acute infarction, hemorrhage, hydrocephalus, extra-axial collection or mass lesion/mass effect. There is mild diffuse low-attenuation within the subcortical and periventricular white matter  compatible with chronic microvascular disease. Prominence of the sulci and ventricles compatible with brain atrophy. Vascular: No hyperdense vessel or unexpected calcification. Skull: Normal. Negative for fracture or focal lesion. Sinuses/Orbits: No acute finding. Other: None. CT CERVICAL SPINE FINDINGS Alignment: Normal. Skull base and vertebrae: No acute fracture. No primary bone lesion or focal pathologic process. Soft tissues and spinal canal: No prevertebral fluid or  swelling. No visible canal hematoma. Disc levels: Multi level disc space narrowing and endplate spurring is identified throughout the cervical spine. Upper chest: Negative. Other: None IMPRESSION: 1. No acute intracranial abnormalities. 2. Chronic small vessel ischemic change and brain atrophy. 3. No evidence for cervical spine fracture. 4. Cervical degenerative disc disease. Electronically Signed   By: Kerby Moors M.D.   On: 05/01/2019 11:36    Assessment/Plan 1. Vascular dementia without behavioral disturbance ([place]) He has had slow progressive course. Not currently on mediations for this problem, at this point I am not sure he would benefit.  Previous notes indicate a possibility of frontotemporal dementia. Noted inappropriate comment to me today during our visit which was easily redirected.  05/02/19 MMSE 21/30 failed clock Continue PT and OT to establish a routine in skilled care.   2. Atrial fibrillation, unspecified type ([place]) Rate controlled Continue coumadin with INR due 11/12 for CVA risk reduction   3. Urinary retention due to benign prostatic hyperplasia He seems to be doing much better with the indwelling catheter. No s/s of infection. F/U with urology as indicated.   4. Essential hypertension Controlled with current regimen. Compression hose ordered due to edema.     Family/ staff Communication: discussed with nursing supervisor Labs/tests ordered:   INR due 11/12

## 2019-05-10 DIAGNOSIS — M62561 Muscle wasting and atrophy, not elsewhere classified, right lower leg: Secondary | ICD-10-CM | POA: Diagnosis not present

## 2019-05-10 DIAGNOSIS — R41841 Cognitive communication deficit: Secondary | ICD-10-CM | POA: Diagnosis not present

## 2019-05-10 DIAGNOSIS — M6389 Disorders of muscle in diseases classified elsewhere, multiple sites: Secondary | ICD-10-CM | POA: Diagnosis not present

## 2019-05-10 DIAGNOSIS — R278 Other lack of coordination: Secondary | ICD-10-CM | POA: Diagnosis not present

## 2019-05-10 DIAGNOSIS — F0280 Dementia in other diseases classified elsewhere without behavioral disturbance: Secondary | ICD-10-CM | POA: Diagnosis not present

## 2019-05-10 DIAGNOSIS — M62562 Muscle wasting and atrophy, not elsewhere classified, left lower leg: Secondary | ICD-10-CM | POA: Diagnosis not present

## 2019-05-11 DIAGNOSIS — F0280 Dementia in other diseases classified elsewhere without behavioral disturbance: Secondary | ICD-10-CM | POA: Diagnosis not present

## 2019-05-11 DIAGNOSIS — R41841 Cognitive communication deficit: Secondary | ICD-10-CM | POA: Diagnosis not present

## 2019-05-11 DIAGNOSIS — M62561 Muscle wasting and atrophy, not elsewhere classified, right lower leg: Secondary | ICD-10-CM | POA: Diagnosis not present

## 2019-05-11 DIAGNOSIS — M62562 Muscle wasting and atrophy, not elsewhere classified, left lower leg: Secondary | ICD-10-CM | POA: Diagnosis not present

## 2019-05-11 DIAGNOSIS — M6389 Disorders of muscle in diseases classified elsewhere, multiple sites: Secondary | ICD-10-CM | POA: Diagnosis not present

## 2019-05-11 DIAGNOSIS — R278 Other lack of coordination: Secondary | ICD-10-CM | POA: Diagnosis not present

## 2019-05-12 DIAGNOSIS — M62562 Muscle wasting and atrophy, not elsewhere classified, left lower leg: Secondary | ICD-10-CM | POA: Diagnosis not present

## 2019-05-12 DIAGNOSIS — M6389 Disorders of muscle in diseases classified elsewhere, multiple sites: Secondary | ICD-10-CM | POA: Diagnosis not present

## 2019-05-12 DIAGNOSIS — F0280 Dementia in other diseases classified elsewhere without behavioral disturbance: Secondary | ICD-10-CM | POA: Diagnosis not present

## 2019-05-12 DIAGNOSIS — M62561 Muscle wasting and atrophy, not elsewhere classified, right lower leg: Secondary | ICD-10-CM | POA: Diagnosis not present

## 2019-05-12 DIAGNOSIS — R41841 Cognitive communication deficit: Secondary | ICD-10-CM | POA: Diagnosis not present

## 2019-05-12 DIAGNOSIS — R278 Other lack of coordination: Secondary | ICD-10-CM | POA: Diagnosis not present

## 2019-05-13 DIAGNOSIS — C641 Malignant neoplasm of right kidney, except renal pelvis: Secondary | ICD-10-CM | POA: Diagnosis not present

## 2019-05-13 DIAGNOSIS — M6389 Disorders of muscle in diseases classified elsewhere, multiple sites: Secondary | ICD-10-CM | POA: Diagnosis not present

## 2019-05-13 DIAGNOSIS — M62561 Muscle wasting and atrophy, not elsewhere classified, right lower leg: Secondary | ICD-10-CM | POA: Diagnosis not present

## 2019-05-13 DIAGNOSIS — R3914 Feeling of incomplete bladder emptying: Secondary | ICD-10-CM | POA: Diagnosis not present

## 2019-05-13 DIAGNOSIS — F0280 Dementia in other diseases classified elsewhere without behavioral disturbance: Secondary | ICD-10-CM | POA: Diagnosis not present

## 2019-05-13 DIAGNOSIS — M62562 Muscle wasting and atrophy, not elsewhere classified, left lower leg: Secondary | ICD-10-CM | POA: Diagnosis not present

## 2019-05-13 DIAGNOSIS — R41841 Cognitive communication deficit: Secondary | ICD-10-CM | POA: Diagnosis not present

## 2019-05-13 DIAGNOSIS — N401 Enlarged prostate with lower urinary tract symptoms: Secondary | ICD-10-CM | POA: Diagnosis not present

## 2019-05-13 DIAGNOSIS — R278 Other lack of coordination: Secondary | ICD-10-CM | POA: Diagnosis not present

## 2019-05-16 DIAGNOSIS — R278 Other lack of coordination: Secondary | ICD-10-CM | POA: Diagnosis not present

## 2019-05-16 DIAGNOSIS — F0280 Dementia in other diseases classified elsewhere without behavioral disturbance: Secondary | ICD-10-CM | POA: Diagnosis not present

## 2019-05-16 DIAGNOSIS — R41841 Cognitive communication deficit: Secondary | ICD-10-CM | POA: Diagnosis not present

## 2019-05-16 DIAGNOSIS — M62561 Muscle wasting and atrophy, not elsewhere classified, right lower leg: Secondary | ICD-10-CM | POA: Diagnosis not present

## 2019-05-16 DIAGNOSIS — M62562 Muscle wasting and atrophy, not elsewhere classified, left lower leg: Secondary | ICD-10-CM | POA: Diagnosis not present

## 2019-05-16 DIAGNOSIS — M6389 Disorders of muscle in diseases classified elsewhere, multiple sites: Secondary | ICD-10-CM | POA: Diagnosis not present

## 2019-05-17 DIAGNOSIS — M62562 Muscle wasting and atrophy, not elsewhere classified, left lower leg: Secondary | ICD-10-CM | POA: Diagnosis not present

## 2019-05-17 DIAGNOSIS — R278 Other lack of coordination: Secondary | ICD-10-CM | POA: Diagnosis not present

## 2019-05-17 DIAGNOSIS — M62561 Muscle wasting and atrophy, not elsewhere classified, right lower leg: Secondary | ICD-10-CM | POA: Diagnosis not present

## 2019-05-17 DIAGNOSIS — M6389 Disorders of muscle in diseases classified elsewhere, multiple sites: Secondary | ICD-10-CM | POA: Diagnosis not present

## 2019-05-17 DIAGNOSIS — R41841 Cognitive communication deficit: Secondary | ICD-10-CM | POA: Diagnosis not present

## 2019-05-17 DIAGNOSIS — F0280 Dementia in other diseases classified elsewhere without behavioral disturbance: Secondary | ICD-10-CM | POA: Diagnosis not present

## 2019-05-18 DIAGNOSIS — R278 Other lack of coordination: Secondary | ICD-10-CM | POA: Diagnosis not present

## 2019-05-18 DIAGNOSIS — M6389 Disorders of muscle in diseases classified elsewhere, multiple sites: Secondary | ICD-10-CM | POA: Diagnosis not present

## 2019-05-18 DIAGNOSIS — R41841 Cognitive communication deficit: Secondary | ICD-10-CM | POA: Diagnosis not present

## 2019-05-18 DIAGNOSIS — M62562 Muscle wasting and atrophy, not elsewhere classified, left lower leg: Secondary | ICD-10-CM | POA: Diagnosis not present

## 2019-05-18 DIAGNOSIS — M62561 Muscle wasting and atrophy, not elsewhere classified, right lower leg: Secondary | ICD-10-CM | POA: Diagnosis not present

## 2019-05-18 DIAGNOSIS — Z20828 Contact with and (suspected) exposure to other viral communicable diseases: Secondary | ICD-10-CM | POA: Diagnosis not present

## 2019-05-18 DIAGNOSIS — Z9189 Other specified personal risk factors, not elsewhere classified: Secondary | ICD-10-CM | POA: Diagnosis not present

## 2019-05-18 DIAGNOSIS — F0280 Dementia in other diseases classified elsewhere without behavioral disturbance: Secondary | ICD-10-CM | POA: Diagnosis not present

## 2019-05-19 DIAGNOSIS — R41841 Cognitive communication deficit: Secondary | ICD-10-CM | POA: Diagnosis not present

## 2019-05-19 DIAGNOSIS — Z7901 Long term (current) use of anticoagulants: Secondary | ICD-10-CM | POA: Diagnosis not present

## 2019-05-19 DIAGNOSIS — D6949 Other primary thrombocytopenia: Secondary | ICD-10-CM | POA: Diagnosis not present

## 2019-05-19 DIAGNOSIS — M62561 Muscle wasting and atrophy, not elsewhere classified, right lower leg: Secondary | ICD-10-CM | POA: Diagnosis not present

## 2019-05-19 DIAGNOSIS — M6389 Disorders of muscle in diseases classified elsewhere, multiple sites: Secondary | ICD-10-CM | POA: Diagnosis not present

## 2019-05-19 DIAGNOSIS — F0280 Dementia in other diseases classified elsewhere without behavioral disturbance: Secondary | ICD-10-CM | POA: Diagnosis not present

## 2019-05-19 DIAGNOSIS — R278 Other lack of coordination: Secondary | ICD-10-CM | POA: Diagnosis not present

## 2019-05-19 DIAGNOSIS — M62562 Muscle wasting and atrophy, not elsewhere classified, left lower leg: Secondary | ICD-10-CM | POA: Diagnosis not present

## 2019-05-19 DIAGNOSIS — I4891 Unspecified atrial fibrillation: Secondary | ICD-10-CM | POA: Diagnosis not present

## 2019-05-19 LAB — LIPID PANEL
Cholesterol: 123 (ref 0–200)
HDL: 30 — AB (ref 35–70)
LDL Cholesterol: 62
Triglycerides: 154 (ref 40–160)

## 2019-05-19 LAB — CBC AND DIFFERENTIAL
HCT: 52 (ref 41–53)
Hemoglobin: 17.5 (ref 13.5–17.5)
Platelets: 139 — AB (ref 150–399)
WBC: 3.7

## 2019-05-19 LAB — TSH: TSH: 0.84 (ref 0.41–5.90)

## 2019-05-19 LAB — HEPATIC FUNCTION PANEL
ALT: 19 (ref 10–40)
AST: 19 (ref 14–40)
Alkaline Phosphatase: 77 (ref 25–125)
Bilirubin, Total: 2.1

## 2019-05-19 LAB — BASIC METABOLIC PANEL
BUN: 27 — AB (ref 4–21)
CO2: 26 — AB (ref 13–22)
Chloride: 107 (ref 99–108)
Creatinine: 1.2 (ref 0.6–1.3)
Glucose: 119
Potassium: 4.1 (ref 3.4–5.3)
Sodium: 142 (ref 137–147)

## 2019-05-19 LAB — PSA: PSA: 4.928

## 2019-05-19 LAB — CBC: RBC: 5.6 — AB (ref 3.87–5.11)

## 2019-05-20 ENCOUNTER — Telehealth: Payer: Self-pay | Admitting: *Deleted

## 2019-05-20 DIAGNOSIS — R278 Other lack of coordination: Secondary | ICD-10-CM | POA: Diagnosis not present

## 2019-05-20 DIAGNOSIS — R41841 Cognitive communication deficit: Secondary | ICD-10-CM | POA: Diagnosis not present

## 2019-05-20 DIAGNOSIS — M62562 Muscle wasting and atrophy, not elsewhere classified, left lower leg: Secondary | ICD-10-CM | POA: Diagnosis not present

## 2019-05-20 DIAGNOSIS — M6389 Disorders of muscle in diseases classified elsewhere, multiple sites: Secondary | ICD-10-CM | POA: Diagnosis not present

## 2019-05-20 DIAGNOSIS — F0280 Dementia in other diseases classified elsewhere without behavioral disturbance: Secondary | ICD-10-CM | POA: Diagnosis not present

## 2019-05-20 DIAGNOSIS — M62561 Muscle wasting and atrophy, not elsewhere classified, right lower leg: Secondary | ICD-10-CM | POA: Diagnosis not present

## 2019-05-20 NOTE — Telephone Encounter (Signed)
Pt had an INR due on 11/12, called Erin from [facility] who stated that they were now managing pt's INR because he is now in the skilled nursing facility at [facility]. Junie Panning, stated that they checked his INR yesterday and that Dr. Mariea Clonts gave dosing instructions. Informed her to let [facility] know if pt is discharged and needs to get his INR followed up and managed.

## 2019-05-23 DIAGNOSIS — M62562 Muscle wasting and atrophy, not elsewhere classified, left lower leg: Secondary | ICD-10-CM | POA: Diagnosis not present

## 2019-05-23 DIAGNOSIS — M6389 Disorders of muscle in diseases classified elsewhere, multiple sites: Secondary | ICD-10-CM | POA: Diagnosis not present

## 2019-05-23 DIAGNOSIS — R41841 Cognitive communication deficit: Secondary | ICD-10-CM | POA: Diagnosis not present

## 2019-05-23 DIAGNOSIS — M62561 Muscle wasting and atrophy, not elsewhere classified, right lower leg: Secondary | ICD-10-CM | POA: Diagnosis not present

## 2019-05-23 DIAGNOSIS — F0280 Dementia in other diseases classified elsewhere without behavioral disturbance: Secondary | ICD-10-CM | POA: Diagnosis not present

## 2019-05-23 DIAGNOSIS — R278 Other lack of coordination: Secondary | ICD-10-CM | POA: Diagnosis not present

## 2019-05-24 DIAGNOSIS — R41841 Cognitive communication deficit: Secondary | ICD-10-CM | POA: Diagnosis not present

## 2019-05-24 DIAGNOSIS — R278 Other lack of coordination: Secondary | ICD-10-CM | POA: Diagnosis not present

## 2019-05-24 DIAGNOSIS — M6389 Disorders of muscle in diseases classified elsewhere, multiple sites: Secondary | ICD-10-CM | POA: Diagnosis not present

## 2019-05-24 DIAGNOSIS — M62561 Muscle wasting and atrophy, not elsewhere classified, right lower leg: Secondary | ICD-10-CM | POA: Diagnosis not present

## 2019-05-24 DIAGNOSIS — Z9189 Other specified personal risk factors, not elsewhere classified: Secondary | ICD-10-CM | POA: Diagnosis not present

## 2019-05-24 DIAGNOSIS — F0280 Dementia in other diseases classified elsewhere without behavioral disturbance: Secondary | ICD-10-CM | POA: Diagnosis not present

## 2019-05-24 DIAGNOSIS — M62562 Muscle wasting and atrophy, not elsewhere classified, left lower leg: Secondary | ICD-10-CM | POA: Diagnosis not present

## 2019-05-25 DIAGNOSIS — F0280 Dementia in other diseases classified elsewhere without behavioral disturbance: Secondary | ICD-10-CM | POA: Diagnosis not present

## 2019-05-25 DIAGNOSIS — R278 Other lack of coordination: Secondary | ICD-10-CM | POA: Diagnosis not present

## 2019-05-25 DIAGNOSIS — R41841 Cognitive communication deficit: Secondary | ICD-10-CM | POA: Diagnosis not present

## 2019-05-25 DIAGNOSIS — M62561 Muscle wasting and atrophy, not elsewhere classified, right lower leg: Secondary | ICD-10-CM | POA: Diagnosis not present

## 2019-05-25 DIAGNOSIS — M6389 Disorders of muscle in diseases classified elsewhere, multiple sites: Secondary | ICD-10-CM | POA: Diagnosis not present

## 2019-05-25 DIAGNOSIS — M62562 Muscle wasting and atrophy, not elsewhere classified, left lower leg: Secondary | ICD-10-CM | POA: Diagnosis not present

## 2019-05-26 DIAGNOSIS — I482 Chronic atrial fibrillation, unspecified: Secondary | ICD-10-CM | POA: Diagnosis not present

## 2019-05-26 DIAGNOSIS — M62562 Muscle wasting and atrophy, not elsewhere classified, left lower leg: Secondary | ICD-10-CM | POA: Diagnosis not present

## 2019-05-26 DIAGNOSIS — M62561 Muscle wasting and atrophy, not elsewhere classified, right lower leg: Secondary | ICD-10-CM | POA: Diagnosis not present

## 2019-05-26 DIAGNOSIS — F0280 Dementia in other diseases classified elsewhere without behavioral disturbance: Secondary | ICD-10-CM | POA: Diagnosis not present

## 2019-05-26 DIAGNOSIS — R41841 Cognitive communication deficit: Secondary | ICD-10-CM | POA: Diagnosis not present

## 2019-05-26 DIAGNOSIS — R278 Other lack of coordination: Secondary | ICD-10-CM | POA: Diagnosis not present

## 2019-05-26 DIAGNOSIS — M6389 Disorders of muscle in diseases classified elsewhere, multiple sites: Secondary | ICD-10-CM | POA: Diagnosis not present

## 2019-05-26 DIAGNOSIS — Z7901 Long term (current) use of anticoagulants: Secondary | ICD-10-CM | POA: Diagnosis not present

## 2019-05-27 ENCOUNTER — Non-Acute Institutional Stay (SKILLED_NURSING_FACILITY): Payer: Medicare Other | Admitting: Adult Health

## 2019-05-27 DIAGNOSIS — Z7901 Long term (current) use of anticoagulants: Secondary | ICD-10-CM

## 2019-05-27 DIAGNOSIS — I4811 Longstanding persistent atrial fibrillation: Secondary | ICD-10-CM

## 2019-05-30 ENCOUNTER — Encounter: Payer: Self-pay | Admitting: Adult Health

## 2019-05-30 ENCOUNTER — Non-Acute Institutional Stay (SKILLED_NURSING_FACILITY): Payer: Medicare Other | Admitting: Adult Health

## 2019-05-30 DIAGNOSIS — R338 Other retention of urine: Secondary | ICD-10-CM | POA: Diagnosis not present

## 2019-05-30 DIAGNOSIS — F0280 Dementia in other diseases classified elsewhere without behavioral disturbance: Secondary | ICD-10-CM | POA: Diagnosis not present

## 2019-05-30 DIAGNOSIS — N401 Enlarged prostate with lower urinary tract symptoms: Secondary | ICD-10-CM | POA: Diagnosis not present

## 2019-05-30 DIAGNOSIS — F0150 Vascular dementia without behavioral disturbance: Secondary | ICD-10-CM

## 2019-05-30 DIAGNOSIS — I5032 Chronic diastolic (congestive) heart failure: Secondary | ICD-10-CM

## 2019-05-30 DIAGNOSIS — I872 Venous insufficiency (chronic) (peripheral): Secondary | ICD-10-CM | POA: Diagnosis not present

## 2019-05-30 DIAGNOSIS — I4811 Longstanding persistent atrial fibrillation: Secondary | ICD-10-CM

## 2019-05-30 DIAGNOSIS — M62561 Muscle wasting and atrophy, not elsewhere classified, right lower leg: Secondary | ICD-10-CM | POA: Diagnosis not present

## 2019-05-30 DIAGNOSIS — I10 Essential (primary) hypertension: Secondary | ICD-10-CM | POA: Diagnosis not present

## 2019-05-30 DIAGNOSIS — N2889 Other specified disorders of kidney and ureter: Secondary | ICD-10-CM | POA: Diagnosis not present

## 2019-05-30 DIAGNOSIS — R278 Other lack of coordination: Secondary | ICD-10-CM | POA: Diagnosis not present

## 2019-05-30 DIAGNOSIS — R41841 Cognitive communication deficit: Secondary | ICD-10-CM | POA: Diagnosis not present

## 2019-05-30 DIAGNOSIS — M62562 Muscle wasting and atrophy, not elsewhere classified, left lower leg: Secondary | ICD-10-CM | POA: Diagnosis not present

## 2019-05-30 DIAGNOSIS — M6389 Disorders of muscle in diseases classified elsewhere, multiple sites: Secondary | ICD-10-CM | POA: Diagnosis not present

## 2019-05-30 NOTE — Progress Notes (Signed)
Location:  Occupational psychologist of Service:  SNF (31) Provider:   Cindi Carbon, ANP [place] [phone]   Gayland Curry, DO  Patient Care Team: Gayland Curry, DO as PCP - General (Geriatric Medicine) [name], MD as PCP - Cardiology (Cardiology)  Extended Emergency Contact Information Primary Emergency Contact: Roache,[name] Address: [address]          [place], [place] [address] [name] of [place] Phone: [phone] Mobile Phone: [phone] Relation: Spouse Secondary Emergency Contact: Mckenzie,Sartaj Address: [place] [address] [name] of [place] Phone: [phone] Mobile Phone: [phone] Relation: Son  Code Status: DNR  Goals of care: Advanced Directive information Advanced Directives 05/03/2019  Does Patient Have a Medical Advance Directive? Yes  Type of Advance Directive Out of facility DNR (pink MOST or yellow form);Living will  Does patient want to make changes to medical advance directive? No - Patient declined  Copy of [place] in Chart? -  Pre-existing out of facility DNR order (yellow form or pink MOST form) Yellow form placed in chart (order not valid for inpatient use);Pink MOST form placed in chart (order not valid for inpatient use)     Chief Complaint  Patient presents with   Acute Visit    coumadin management    HPI:  Pt is a 85 y.o. male seen today for an acute visit for coumadin management due to hx of afib and CVA. INR returned at 3.8.  Resident denies any issues but has underlying dementia. He has a catheter in place and the nurse has noticed some mild bleeding from the site and in the urine bag. Vitals are stable.    Past Medical History:  Diagnosis Date   Adult failure to thrive    Per incoming records from [facility]   Allergic rhinitis    Per incoming records from [facility]   Atrial fibrillation [facility])    BPH (benign prostatic hyperplasia)    Per incoming records from [place] [facility])    Right, Per incoming records from [place]    Per incoming records from [facility]   Cerebrovascular accident, late effects    Per incoming records from [place]   Chronic kidney disease, stage III (moderate)    Per incoming records from [facility])    Per incoming records from [facility]   Cognitive impairment    Mild, Per incoming records [facility]   Constipation    Per incoming records from [facility]   Dementia [facility])    Diarrhea    Better off Aricept, Per incoming records from [facility]   Diastolic dysfunction    on 2D Echo 07/2009 and 2016, Per incoming records from [facility]   Diverticulosis    Mild, Per incoming records from [facility]   Diverticulosis of colon (without mention of hemorrhage)    ED (erectile dysfunction)    Per incoming records from [facility]   History of Coumadin therapy    Per incoming records from [facility]   History of echocardiogram 09/2014   Per incoming records from [facility]   Hyperlipemia    Hypertension    Kidney mass  Per incoming records from [place] leg edema    Per incoming records from [facility])    Neuropathy    Per incoming records from [facility]   OA (osteoarthritis)    Per incoming records from [facility]   Peripheral neuropathy    Per incoming records from [place] history of colonic [place] & 2004   adenomatous polyps   Pulmonary arterial  hypertension ([place])    Per incoming records from [place]    Per incoming records from [facility]   Thrombocytopenia [facility]) 2017   Per incoming records from [facility]   UTI (urinary tract infection)    Sepsis, Per incoming records from [place] as ambulation aid    Per incoming records from [facility]   Past Surgical History:  Procedure Laterality Date   APPENDECTOMY     Per incoming records from [place]     Per incoming records from [place]     right   PILONIDAL CYST DRAINAGE     POLYPECTOMY     Per incoming records from [place] tumor lumbar spine     SPINE SURGERY     tumor removed   THORACIC 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     Outpatient Encounter Medications as of 05/27/2019  Medication Sig   acetaminophen (TYLENOL) 500 MG tablet Take 500 mg by mouth 2 (two) times daily. Scheduled and every 4 hours as needed (ending 05/05/2019)Document area of discomfort and effectiveness of medication.   atorvastatin (LIPITOR) 20 MG tablet Take 20 mg by mouth every evening.    bisacodyl (DULCOLAX) 10 MG  suppository Place 10 mg rectally as needed for moderate constipation. X 2 days when needed   cholecalciferol (VITAMIN D3) 25 MCG (1000 UT) tablet Take 2,000 Units by mouth daily.    DULoxetine (CYMBALTA) 30 MG capsule Take 30 mg by mouth daily.   hydrochlorothiazide (MICROZIDE) 12.5 MG capsule Take 12.5 mg by mouth every other day.   methenamine (MANDELAMINE) 1 g tablet Take 1,000 mg by mouth 2 (two) times daily.   warfarin (COUMADIN) 7.5 MG tablet Take 7.5 mg by mouth daily. EXCEPT 1/2 tablet on Mon, Tue, Thu, and Sat   No facility-administered encounter medications on file as of 05/27/2019.     Review of Systems  Constitutional: Negative for activity change, appetite change, diaphoresis, fatigue, fever and unexpected weight change.  Hematological: Bruises/bleeds easily.    Immunization History  Administered Date(s) Administered   Influenza-Unspecified 04/17/2014, 05/01/2016, 04/15/2017   Pneumococcal Conjugate-13 04/20/2014   Pneumococcal Polysaccharide-23 07/08/2003, 03/31/2011   Pneumococcal-Unspecified 01/26/2004   Td 08/23/2007  Tdap 01/07/2012   Zoster 07/17/2005   Zoster Recombinat (Shingrix) 10/05/2017, 12/09/2017   Pertinent  Health Maintenance Due  Topic Date Due   INFLUENZA VACCINE  Completed   PNA vac Low Risk Adult  Completed   Fall Risk  05/07/2019 05/03/2019  Risk for fall due to : History of fall(s);Impaired balance/gait;Impaired mobility;Mental status change;Medication side effect History of fall(s);Impaired balance/gait;Impaired mobility;Medication side effect;Mental status change  Follow up Falls evaluation completed;Education provided;Falls prevention discussed Falls evaluation completed;Education provided;Falls prevention discussed  [facility] all addressed at [place]:    There were no vitals filed for this visit. There is no height or weight on file to calculate BMI. Physical Exam Vitals  signs and nursing note reviewed.  Neurological:     Mental Status: He is alert.     Labs reviewed: Recent Labs    12/10/18 03/22/19 0808 05/01/19 1037  NA 141 140 140  K 4.4 4.2 4.4  CL  --  104 103  CO2  --  28 25  GLUCOSE  --  206* 178*  BUN 16 17 27*  CREATININE 1.1 1.30* 1.44*  CALCIUM  --  10.3 10.3   Recent Labs    12/10/18 03/22/19 0808 05/01/19 1037  AST 15 17 26   ALT 13 15 21   ALKPHOS 65 73 65  BILITOT  --  1.2 1.5*  PROT  --  6.4* 6.8  ALBUMIN  --  4.1 4.0   Recent Labs    03/22/19 0808 05/01/19 1037  WBC 2.3* 3.5*  NEUTROABS 1.1* 2.4  HGB 16.0 15.5  HCT 48.2 47.4  MCV 95.4 95.4  PLT 82* PLATELET CLUMPS NOTED ON SMEAR, COUNT APPEARS DECREASED   Lab Results  Component Value Date   TSH 1.080 07/04/2014   Lab Results  Component Value Date   HGBA1C 6.0 (H) 07/19/2014   Lab Results  Component Value Date   CHOL 137 05/24/2018   HDL 31 (A) 05/24/2018   LDLCALC 64 05/24/2018   TRIG 210 (A) 05/24/2018    Significant Diagnostic Results in last 30 days:  Ct Head Wo Contrast  Result Date: 05/01/2019 CLINICAL DATA:  Fall. On anticoagulation. EXAM: CT HEAD WITHOUT CONTRAST CT CERVICAL SPINE WITHOUT CONTRAST TECHNIQUE: Multidetector CT imaging of the head and cervical spine was performed following the standard protocol without intravenous contrast. Multiplanar CT image reconstructions of the cervical spine were also generated. COMPARISON:  MRI brain 12/02/2018 FINDINGS: CT HEAD FINDINGS Brain: No evidence of acute infarction, hemorrhage, hydrocephalus, extra-axial collection or mass lesion/mass effect. There is mild diffuse low-attenuation within the subcortical and periventricular white matter compatible with chronic microvascular disease. Prominence of the sulci and ventricles compatible with brain atrophy. Vascular: No hyperdense vessel or unexpected calcification. Skull: Normal. Negative for fracture or focal lesion. Sinuses/Orbits: No acute finding.  Other: None. CT CERVICAL SPINE FINDINGS Alignment: Normal. Skull base and vertebrae: No acute fracture. No primary bone lesion or focal pathologic process. Soft tissues and spinal canal: No prevertebral fluid or swelling. No visible canal hematoma. Disc levels: Multi level disc space narrowing and endplate spurring is identified throughout the cervical spine. Upper chest: Negative. Other: None IMPRESSION: 1. No acute intracranial abnormalities. 2. Chronic small vessel ischemic change and brain atrophy. 3. No evidence for cervical spine fracture. 4. Cervical degenerative disc disease. Electronically Signed   By: Kerby Moors M.D.   On: 05/01/2019 11:36   Ct Cervical Spine Wo Contrast  Result Date: 05/01/2019 CLINICAL DATA:  Fall. On anticoagulation. EXAM: CT HEAD WITHOUT CONTRAST CT CERVICAL SPINE WITHOUT CONTRAST TECHNIQUE: Multidetector CT imaging of the head and cervical spine was performed following the standard protocol without intravenous contrast. Multiplanar CT image reconstructions of the cervical spine were also generated. COMPARISON:  MRI brain 12/02/2018 FINDINGS: CT HEAD FINDINGS Brain: No evidence of acute infarction, hemorrhage, hydrocephalus, extra-axial collection or mass lesion/mass effect. There is mild diffuse low-attenuation within the subcortical and periventricular white matter compatible with chronic microvascular disease. Prominence of the sulci and ventricles compatible with brain atrophy. Vascular: No hyperdense vessel or unexpected calcification. Skull: Normal. Negative for fracture or focal lesion. Sinuses/Orbits: No acute finding. Other: None. CT CERVICAL SPINE FINDINGS Alignment: Normal. Skull base and vertebrae: No acute fracture. No primary bone lesion or focal pathologic process. Soft tissues and spinal canal: No prevertebral fluid or swelling. No visible canal hematoma. Disc levels: Multi level disc space narrowing and endplate spurring is identified throughout the cervical  spine. Upper chest: Negative. Other: None IMPRESSION: 1. No acute intracranial abnormalities. 2. Chronic small vessel ischemic change and brain atrophy. 3. No evidence for cervical spine fracture. 4. Cervical degenerative disc disease. Electronically Signed   By: Kerby Moors M.D.   On: 05/01/2019 11:36    Assessment/Plan 1. Chronic anticoagulation CHADS VASC score 7 Decrease coumadin to 7 mg Sun, W, and Friday. 3 mg M, T, [place], and Sat. INr in 1 week due to rising INR. Monitor for increased bleeding from catheter and report.   2. Longstanding persistent atrial fibrillation ([place]) CVA risk reduction with coumadin as above. Rate controlled currently without meds.     Family/ staff Communication: staff  Labs/tests ordered:  INR 1 week

## 2019-05-30 NOTE — Progress Notes (Signed)
Location:  Occupational psychologist of Service:  SNF (31) Provider:   Cindi Carbon, ANP [place] [phone]   Gayland Curry, DO  Patient Care Team: Gayland Curry, DO as PCP - General (Geriatric Medicine) [name], MD as PCP - Cardiology (Cardiology)  Extended Emergency Contact Information Primary Emergency Contact: Taranto,[name] Address: [address]          [place], [place] [address] [name] of [place] Phone: [phone] Mobile Phone: [phone] Relation: Spouse Secondary Emergency Contact: Voth,Trust Address: [place] [address] [name] of [place] Phone: [phone] Mobile Phone: [phone] Relation: Son  Code Status:  DNR Goals of care: Advanced Directive information Advanced Directives 05/03/2019  Does Patient Have a Medical Advance Directive? Yes  Type of Advance Directive Out of facility DNR (pink MOST or yellow form);Living will  Does patient want to make changes to medical advance directive? No - Patient declined  Copy of [place] in Chart? -  Pre-existing out of facility DNR order (yellow form or pink MOST form) Yellow form placed in chart (order not valid for inpatient use);Pink MOST form placed in chart (order not valid for inpatient use)     Chief Complaint  Patient presents with  . Medical Management of Chronic Issues    HPI:  Pt is a 85 y.o. male seen today for medical management of chronic diseases.    He is adjusting well to the skilled care environment. He has no acute complaints.   He is ambulating with a walker and able to assist with his care and feed himself. MMSE 21/30 05/02/19 with a hx of probable vascular dementia. 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   He has chronic edema in both legs and is wearing compress hose  which helps.   BPH with chronic indwelling foley: no urinary symptoms, draining dark amber urine 200 cc over night  Hx of right renal mass with active surveillance by urology. No symptoms CT 06/16/18 revealed right upper pole renal mass measuring 3.8 x 2.2 cm with no adenopathy or renal vein involvement.   BP controlled  Afib: rate controlled and on coumadin for CVA risk reduction.      Past Medical History:  Diagnosis Date  . Adult failure to thrive    Per incoming records from [facility]  . Allergic rhinitis    Per incoming records from [facility]  . Atrial fibrillation ([place])   . BPH (benign prostatic hyperplasia)    Per incoming records from [facility]  . Cancer of kidney [facility])    Right, Per incoming records from [facility]  . Cataract    Per incoming records from [facility]  . Cerebrovascular accident, late effects    Per incoming records from [facility]  . Chronic kidney disease, stage III (moderate)    Per incoming records from [facility]  . Claudication [facility])    Per incoming records from [facility]  . Cognitive impairment    Mild, Per incoming records from [facility]  . Constipation    Per incoming records from [facility]  . Dementia ([place])   . Diarrhea    Better off Aricept, Per incoming records from [facility]  . Diastolic  dysfunction    on 2D Echo 07/2009 and 2016, Per incoming records from [facility]  . Diverticulosis    Mild, Per incoming records from [facility]  . Diverticulosis of colon (without mention of hemorrhage)   . ED (erectile dysfunction)    Per incoming records from [facility]  . History of Coumadin therapy    Per incoming records from [facility]  . History of echocardiogram 09/2014   Per  incoming records from [facility]  . Hyperlipemia   . Hypertension   . Kidney mass    Per incoming records from [facility]  . Lower leg edema    Per incoming records from [facility]  . Melanoma ([place])   . Neuropathy    Per incoming records from [facility]  . OA (osteoarthritis)    Per incoming records from [facility]  . Peripheral neuropathy    Per incoming records from [facility]  . Personal history of colonic polyps 1999 & 2004   adenomatous polyps  . Pulmonary arterial hypertension ([place])    Per incoming records from [facility]  . Rosacea    Per incoming records from [facility]  . Thrombocytopenia ([place]) 2017   Per incoming records from [facility]  . UTI (urinary tract infection)    Sepsis, Per incoming records from [facility]  . Ventricular hypertrophy   . Walker as ambulation aid    Per incoming records from [employer]   Past Surgical History:  Procedure Laterality Date  . APPENDECTOMY     Per incoming records from [facility]  . CATARACT EXTRACTION     Per incoming records from [place]  . KNEE SURGERY     right  . PILONIDAL CYST DRAINAGE    . POLYPECTOMY     Per incoming records from [facility]  . schwanoma tumor lumbar spine    . SPINE SURGERY     tumor removed  . THORACIC 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, Per incoming records from [facility]  . TONSILLECTOMY AND ADENOIDECTOMY      Allergies  Allergen Reactions  . Penicillins Rash    Has patient had a PCN reaction causing immediate rash, facial/tongue/throat swelling, SOB or lightheadedness with hypotension: unknown Has patient had a PCN reaction causing severe rash involving mucus membranes or skin necrosis: unknown Has patient had  a PCN reaction that required hospitalization : unknown Has patient had a PCN reaction occurring within the last 10 years: no If all of the above answers are "NO", then may proceed with Cephalosporin use.     Outpatient Encounter Medications as of 05/30/2019  Medication Sig  . acetaminophen (TYLENOL) 500 MG tablet Take 500 mg by mouth 2 (two) times daily. Scheduled and every 4 hours as needed (ending 05/05/2019)Document area of discomfort and effectiveness of medication.  Marland Kitchen atorvastatin (LIPITOR) 20 MG tablet Take 20 mg by mouth every evening.   . bisacodyl (DULCOLAX) 10 MG suppository Place 10 mg rectally as needed for moderate constipation. X 2 days when needed  . cholecalciferol (VITAMIN D3) 25 MCG (1000 UT) tablet Take 2,000 Units by mouth daily.   . DULoxetine (CYMBALTA) 30 MG capsule Take 30 mg by mouth daily.  . hydrochlorothiazide (MICROZIDE) 12.5 MG capsule Take 12.5 mg by mouth every other day.  . methenamine (MANDELAMINE) 1 g tablet Take 1,000  mg by mouth 2 (two) times daily.  Marland Kitchen warfarin (COUMADIN) 1 MG tablet Take 7 mg by mouth every other day. Sun, Wed, Fri  . warfarin (COUMADIN) 3 MG tablet Take 3 mg by mouth every other day. Sammuel Cooper, [place], Sat   No facility-administered encounter medications on file as of 05/30/2019.     Review of Systems  Constitutional: Negative for activity change, appetite change, chills, diaphoresis, fatigue, fever and unexpected weight change.  Respiratory: Negative for cough, shortness of breath, wheezing and stridor.   Cardiovascular: Positive for leg swelling. Negative for chest pain and palpitations.  Gastrointestinal: Negative for abdominal distention, abdominal pain, constipation and diarrhea.  Genitourinary: Negative for difficulty urinating and dysuria.  Musculoskeletal: Positive for gait problem. Negative for arthralgias, back pain, joint swelling and myalgias.  Neurological: Positive for tremors (noted to hands occasionally ). Negative for  dizziness, seizures, syncope, facial asymmetry, speech difficulty, weakness and headaches.  Hematological: Negative for adenopathy. Bruises/bleeds easily.  Psychiatric/Behavioral: Positive for confusion. Negative for agitation and behavioral problems.    Immunization History  Administered Date(s) Administered  . Influenza-Unspecified 04/17/2014, 05/01/2016, 04/15/2017  . Pneumococcal Conjugate-13 04/20/2014  . Pneumococcal Polysaccharide-23 07/08/2003, 03/31/2011  . Pneumococcal-Unspecified 01/26/2004  . Td 08/23/2007  . Tdap 01/07/2012  . Zoster 07/17/2005  . Zoster Recombinat (Shingrix) 10/05/2017, 12/09/2017   Pertinent  Health Maintenance Due  Topic Date Due  . INFLUENZA VACCINE  Completed  . PNA vac Low Risk Adult  Completed   Fall Risk  05/07/2019 05/03/2019  Risk for fall due to : History of fall(s);Impaired balance/gait;Impaired mobility;Mental status change;Medication side effect History of fall(s);Impaired balance/gait;Impaired mobility;Medication side effect;Mental status change  Follow up Falls evaluation completed;Education provided;Falls prevention discussed Falls evaluation completed;Education provided;Falls prevention discussed  [facility] at [facility] all addressed at [place]:    Vitals:   05/30/19 1422  Weight: 272 lb 4.8 oz (123.5 kg)   Body mass index is 30.68 kg/m. Physical Exam Constitutional:      General: He is not in acute distress.    Appearance: He is not diaphoretic.  HENT:     Head: Normocephalic and atraumatic.  Neck:     Thyroid: No thyromegaly.     Vascular: No JVD.     Trachea: No tracheal deviation.  Cardiovascular:     Rate and Rhythm: Normal rate. Rhythm irregular.     Heart sounds: No murmur.  Pulmonary:     Effort: Pulmonary effort is normal. No respiratory distress.     Breath sounds: Normal breath sounds. No wheezing.  Abdominal:     General: Bowel sounds are normal. There is no distension.      Palpations: Abdomen is soft.     Tenderness: There is no abdominal tenderness.  Musculoskeletal:     Right lower leg: Edema present.     Left lower leg: Edema present.  Lymphadenopathy:     Cervical: No cervical adenopathy.  Skin:    General: Skin is warm and dry.  Neurological:     General: No focal deficit present.     Mental Status: He is alert. Mental status is at baseline.     Comments: Oriented x 2. Pill rolling tremor to both hands     Labs reviewed: Recent Labs    12/10/18 03/22/19 0808 05/01/19 1037  NA 141 140 140  K 4.4 4.2 4.4  CL  --  104 103  CO2  --  28 25  GLUCOSE  --  206*  178*  BUN 16 17 27*  CREATININE 1.1 1.30* 1.44*  CALCIUM  --  10.3 10.3   Recent Labs    12/10/18 03/22/19 0808 05/01/19 1037  AST 15 17 26   ALT 13 15 21   ALKPHOS 65 73 65  BILITOT  --  1.2 1.5*  PROT  --  6.4* 6.8  ALBUMIN  --  4.1 4.0   Recent Labs    03/22/19 0808 05/01/19 1037  WBC 2.3* 3.5*  NEUTROABS 1.1* 2.4  HGB 16.0 15.5  HCT 48.2 47.4  MCV 95.4 95.4  PLT 82* PLATELET CLUMPS NOTED ON SMEAR, COUNT APPEARS DECREASED   Lab Results  Component Value Date   TSH 1.080 07/04/2014   Lab Results  Component Value Date   HGBA1C 6.0 (H) 07/19/2014   Lab Results  Component Value Date   CHOL 137 05/24/2018   HDL 31 (A) 05/24/2018   LDLCALC 64 05/24/2018   TRIG 210 (A) 05/24/2018    Significant Diagnostic Results in last 30 days:  Ct Head Wo Contrast  Result Date: 05/01/2019 CLINICAL DATA:  Fall. On anticoagulation. EXAM: CT HEAD WITHOUT CONTRAST CT CERVICAL SPINE WITHOUT CONTRAST TECHNIQUE: Multidetector CT imaging of the head and cervical spine was performed following the standard protocol without intravenous contrast. Multiplanar CT image reconstructions of the cervical spine were also generated. COMPARISON:  MRI brain 12/02/2018 FINDINGS: CT HEAD FINDINGS Brain: No evidence of acute infarction, hemorrhage, hydrocephalus, extra-axial collection or mass  lesion/mass effect. There is mild diffuse low-attenuation within the subcortical and periventricular white matter compatible with chronic microvascular disease. Prominence of the sulci and ventricles compatible with brain atrophy. Vascular: No hyperdense vessel or unexpected calcification. Skull: Normal. Negative for fracture or focal lesion. Sinuses/Orbits: No acute finding. Other: None. CT CERVICAL SPINE FINDINGS Alignment: Normal. Skull base and vertebrae: No acute fracture. No primary bone lesion or focal pathologic process. Soft tissues and spinal canal: No prevertebral fluid or swelling. No visible canal hematoma. Disc levels: Multi level disc space narrowing and endplate spurring is identified throughout the cervical spine. Upper chest: Negative. Other: None IMPRESSION: 1. No acute intracranial abnormalities. 2. Chronic small vessel ischemic change and brain atrophy. 3. No evidence for cervical spine fracture. 4. Cervical degenerative disc disease. Electronically Signed   By: Kerby Moors M.D.   On: 05/01/2019 11:36   Ct Cervical Spine Wo Contrast  Result Date: 05/01/2019 CLINICAL DATA:  Fall. On anticoagulation. EXAM: CT HEAD WITHOUT CONTRAST CT CERVICAL SPINE WITHOUT CONTRAST TECHNIQUE: Multidetector CT imaging of the head and cervical spine was performed following the standard protocol without intravenous contrast. Multiplanar CT image reconstructions of the cervical spine were also generated. COMPARISON:  MRI brain 12/02/2018 FINDINGS: CT HEAD FINDINGS Brain: No evidence of acute infarction, hemorrhage, hydrocephalus, extra-axial collection or mass lesion/mass effect. There is mild diffuse low-attenuation within the subcortical and periventricular white matter compatible with chronic microvascular disease. Prominence of the sulci and ventricles compatible with brain atrophy. Vascular: No hyperdense vessel or unexpected calcification. Skull: Normal. Negative for fracture or focal lesion.  Sinuses/Orbits: No acute finding. Other: None. CT CERVICAL SPINE FINDINGS Alignment: Normal. Skull base and vertebrae: No acute fracture. No primary bone lesion or focal pathologic process. Soft tissues and spinal canal: No prevertebral fluid or swelling. No visible canal hematoma. Disc levels: Multi level disc space narrowing and endplate spurring is identified throughout the cervical spine. Upper chest: Negative. Other: None IMPRESSION: 1. No acute intracranial abnormalities. 2. Chronic small vessel ischemic change and brain atrophy. 3.  No evidence for cervical spine fracture. 4. Cervical degenerative disc disease. Electronically Signed   By: Kerby Moors M.D.   On: 05/01/2019 11:36    Assessment/Plan 1. Vascular dementia without behavioral disturbance ([place]) Adjusting well to the skilled unit.  Continue supportive care in the skilled environment. Monitor for worsening behaviors and provide redirection if indicated.  Pill rolling tremor noted on exam but this does not interfere with his care or functional status at this time.   2. Longstanding persistent atrial fibrillation (HCC) Rate is controlled without meds Continue coumadin for CVA risk reduction   3. Chronic diastolic heart failure (HCC) Compensated. Continue compression hose, heart healthy diet, and monitor weight and bp.  4. Essential hypertension Controlled, continue hctz 12.5 mg qod  5. Urinary retention due to benign prostatic hyperplasia Doing well with foley in  Place followed by urology  6. Renal mass, right No new symptoms, followed by urology   7. Venous insufficiency Continue compression hose and elevation    Family/ staff Communication: I called and introduced myself to his wife [name]. We discussed that he needs an updated most form. She is coming by today to take him to the dentist and so the staff will leave a new form for her to fill out. The one we have is only a copy and dated 2017.  Labs/tests ordered:   NA

## 2019-05-31 DIAGNOSIS — Z9189 Other specified personal risk factors, not elsewhere classified: Secondary | ICD-10-CM | POA: Diagnosis not present

## 2019-05-31 DIAGNOSIS — Q6689 Other  specified congenital deformities of feet: Secondary | ICD-10-CM | POA: Diagnosis not present

## 2019-05-31 DIAGNOSIS — B351 Tinea unguium: Secondary | ICD-10-CM | POA: Diagnosis not present

## 2019-06-01 DIAGNOSIS — M6389 Disorders of muscle in diseases classified elsewhere, multiple sites: Secondary | ICD-10-CM | POA: Diagnosis not present

## 2019-06-01 DIAGNOSIS — R41841 Cognitive communication deficit: Secondary | ICD-10-CM | POA: Diagnosis not present

## 2019-06-01 DIAGNOSIS — F0280 Dementia in other diseases classified elsewhere without behavioral disturbance: Secondary | ICD-10-CM | POA: Diagnosis not present

## 2019-06-01 DIAGNOSIS — R278 Other lack of coordination: Secondary | ICD-10-CM | POA: Diagnosis not present

## 2019-06-01 DIAGNOSIS — M62562 Muscle wasting and atrophy, not elsewhere classified, left lower leg: Secondary | ICD-10-CM | POA: Diagnosis not present

## 2019-06-01 DIAGNOSIS — M62561 Muscle wasting and atrophy, not elsewhere classified, right lower leg: Secondary | ICD-10-CM | POA: Diagnosis not present

## 2019-06-02 ENCOUNTER — Other Ambulatory Visit: Payer: Self-pay

## 2019-06-02 ENCOUNTER — Emergency Department (HOSPITAL_COMMUNITY)
Admission: EM | Admit: 2019-06-02 | Discharge: 2019-06-02 | Disposition: A | Discharge status: Home / Self Care | Payer: Medicare Other | Attending: Emergency Medicine | Admitting: Emergency Medicine

## 2019-06-02 ENCOUNTER — Encounter (HOSPITAL_COMMUNITY): Payer: Self-pay

## 2019-06-02 DIAGNOSIS — N183 Chronic kidney disease, stage 3 unspecified: Secondary | ICD-10-CM | POA: Diagnosis not present

## 2019-06-02 DIAGNOSIS — I130 Hypertensive heart and chronic kidney disease with heart failure and stage 1 through stage 4 chronic kidney disease, or unspecified chronic kidney disease: Secondary | ICD-10-CM | POA: Insufficient documentation

## 2019-06-02 DIAGNOSIS — F0390 Unspecified dementia without behavioral disturbance: Secondary | ICD-10-CM | POA: Diagnosis not present

## 2019-06-02 DIAGNOSIS — D689 Coagulation defect, unspecified: Secondary | ICD-10-CM | POA: Diagnosis not present

## 2019-06-02 DIAGNOSIS — Z79899 Other long term (current) drug therapy: Secondary | ICD-10-CM | POA: Diagnosis not present

## 2019-06-02 DIAGNOSIS — Z87891 Personal history of nicotine dependence: Secondary | ICD-10-CM | POA: Diagnosis not present

## 2019-06-02 DIAGNOSIS — I5032 Chronic diastolic (congestive) heart failure: Secondary | ICD-10-CM | POA: Insufficient documentation

## 2019-06-02 DIAGNOSIS — R319 Hematuria, unspecified: Secondary | ICD-10-CM | POA: Diagnosis not present

## 2019-06-02 DIAGNOSIS — I10 Essential (primary) hypertension: Secondary | ICD-10-CM | POA: Diagnosis not present

## 2019-06-02 DIAGNOSIS — R339 Retention of urine, unspecified: Secondary | ICD-10-CM | POA: Insufficient documentation

## 2019-06-02 DIAGNOSIS — Z7901 Long term (current) use of anticoagulants: Secondary | ICD-10-CM | POA: Diagnosis not present

## 2019-06-02 DIAGNOSIS — Z85528 Personal history of other malignant neoplasm of kidney: Secondary | ICD-10-CM | POA: Insufficient documentation

## 2019-06-02 LAB — PROTIME-INR
INR: 2.9 — ABNORMAL HIGH (ref 0.8–1.2)
Prothrombin Time: 30.4 seconds — ABNORMAL HIGH (ref 11.4–15.2)

## 2019-06-02 LAB — URINALYSIS, ROUTINE W REFLEX MICROSCOPIC
Bilirubin Urine: NEGATIVE
Glucose, UA: 50 mg/dL — AB
Ketones, ur: 5 mg/dL — AB
Nitrite: NEGATIVE
Protein, ur: >=300 mg/dL — AB
RBC / HPF: >50 RBC/hpf — ABNORMAL HIGH (ref 0–5)
Specific Gravity, Urine: 1.018 (ref 1.005–1.030)
pH: 6 (ref 5.0–8.0)

## 2019-06-02 LAB — CBC WITH DIFFERENTIAL/PLATELET
Abs Immature Granulocytes: 0.09 10*3/uL — ABNORMAL HIGH (ref 0.00–0.07)
Basophils Absolute: 0 10*3/uL (ref 0.0–0.1)
Basophils Relative: 0 %
Eosinophils Absolute: 0 10*3/uL (ref 0.0–0.5)
Eosinophils Relative: 0 %
HCT: 46.2 % (ref 39.0–52.0)
Hemoglobin: 15 g/dL (ref 13.0–17.0)
Immature Granulocytes: 2 %
Lymphocytes Relative: 13 %
Lymphs Abs: 0.5 10*3/uL — ABNORMAL LOW (ref 0.7–4.0)
MCH: 30.5 pg (ref 26.0–34.0)
MCHC: 32.5 g/dL (ref 30.0–36.0)
MCV: 93.9 fL (ref 80.0–100.0)
Monocytes Absolute: 0.5 10*3/uL (ref 0.1–1.0)
Monocytes Relative: 12 %
Neutro Abs: 3 10*3/uL (ref 1.7–7.7)
Neutrophils Relative %: 73 %
Platelets: 78 10*3/uL — ABNORMAL LOW (ref 150–400)
RBC: 4.92 MIL/uL (ref 4.22–5.81)
RDW: 14.3 % (ref 11.5–15.5)
WBC: 4.1 10*3/uL (ref 4.0–10.5)
nRBC: 0 % (ref 0.0–0.2)

## 2019-06-02 LAB — BASIC METABOLIC PANEL
Anion gap: 11 (ref 5–15)
BUN: 20 mg/dL (ref 8–23)
CO2: 23 mmol/L (ref 22–32)
Calcium: 10.1 mg/dL (ref 8.9–10.3)
Chloride: 104 mmol/L (ref 98–111)
Creatinine, Ser: 1.32 mg/dL — ABNORMAL HIGH (ref 0.61–1.24)
GFR calc Af Amer: 57 mL/min — ABNORMAL LOW (ref >60)
GFR calc non Af Amer: 49 mL/min — ABNORMAL LOW (ref >60)
Glucose, Bld: 125 mg/dL — ABNORMAL HIGH (ref 70–99)
Potassium: 3.8 mmol/L (ref 3.5–5.1)
Sodium: 138 mmol/L (ref 135–145)

## 2019-06-02 NOTE — ED Triage Notes (Signed)
Arrived by [facility] from [facility]. No output from indwelling catheter over past 4-5 hours. Hematuria X6 days.

## 2019-06-02 NOTE — ED Provider Notes (Signed)
[place] DEPT Provider Note   CSN: [id number] Arrival date & time: 06/02/19  [id number]     History   Chief Complaint Chief Complaint  Patient presents with  . Urinary Retention    HPI Peter Singh is a 85 y.o. male.     Patient here by EMS from [facility] for evaluation and treatment of urinary retention. EMS reports the nursing facility staff attempted to place a foley catheter x 2 for urinary retention since this afternoon without success. He has a history of BPH with indwelling foley catheter that had no output over 5 hours. The patient reports lower abdominal pain and the need to urinate. History of dementia. No reported fever, vomiting or bleeding.  The history is provided by the patient and the EMS personnel. No language interpreter was used.    Past Medical History:  Diagnosis Date  . Adult failure to thrive    Per incoming records from [facility]  . Allergic rhinitis    Per incoming records from [facility]  . Atrial fibrillation ([place])   . BPH (benign prostatic hyperplasia)    Per incoming records from [facility]  . Cancer of kidney [facility])    Right, Per incoming records from [facility]  . Cataract    Per incoming records from [facility]  . Cerebrovascular accident, late effects    Per incoming records from [facility]  . Chronic kidney disease, stage III (moderate)    Per incoming records from [facility]  . Claudication [facility])    Per incoming records from [facility]  . Cognitive impairment    Mild, Per incoming records from [facility]  . Constipation    Per incoming records from [facility]  . Dementia ([place])   . Diarrhea    Better off Aricept, Per incoming records from [facility]  . Diastolic dysfunction    on 2D Echo 07/2009 and 2016, Per  incoming records from [facility]  . Diverticulosis    Mild, Per incoming records from [facility]  . Diverticulosis of colon (without mention of hemorrhage)   . ED (erectile dysfunction)    Per incoming records from [facility]  . History of Coumadin therapy    Per incoming records from [facility]  . History of echocardiogram 09/2014   Per incoming records from [facility]  . Hyperlipemia   . Hypertension   . Kidney mass    Per incoming records from [facility]  . Lower leg edema    Per incoming records from [facility]  . Melanoma ([place])   . Neuropathy    Per incoming records from [facility]  . OA (osteoarthritis)    Per incoming records from [facility]  . Peripheral neuropathy    Per incoming records from [facility]  . Personal history of colonic polyps 1999 & 2004   adenomatous polyps  . Pulmonary arterial hypertension ([place])    Per incoming records from [facility]  . Rosacea    Per incoming records from [facility]  . Thrombocytopenia ([place]) 2017   Per incoming records from [facility]  . UTI (urinary tract infection)    Sepsis, Per incoming records from [facility]  . Ventricular hypertrophy   . Walker as ambulation aid    Per incoming records from [employer]  Patient Active Problem List   Diagnosis Date Noted  . Vascular dementia without behavioral disturbance ([place]) 05/07/2019  . Bacteriuria 05/07/2019  . Other neutropenia ([place]) 11/16/2018  . Chronic diastolic heart failure ([place]) 06/08/2018  . Encounter for therapeutic drug monitoring 03/19/2017  . Urinary retention due to benign prostatic hyperplasia 10/14/2016  . Renal mass, right 10/14/2016  . Mild dementia ([place]) 10/14/2016  . Sepsis secondary to UTI ([place]) 10/07/2016   . Thrombocytopenia ([place]) 10/07/2016  . Hereditary and idiopathic peripheral neuropathy 07/06/2014  . Essential hypertension 03/24/2014  . Hyperlipidemia 03/24/2014  . Chronic anticoagulation 03/24/2014  . Atrial fibrillation ([place]) 05/17/2013    Past Surgical History:  Procedure Laterality Date  . APPENDECTOMY     Per incoming records from [facility]  . CATARACT EXTRACTION     Per incoming records from [facility]  . KNEE SURGERY     right  . PILONIDAL CYST DRAINAGE    . POLYPECTOMY     Per incoming records from [facility]  . schwanoma tumor lumbar spine    . SPINE SURGERY     tumor removed  . THORACIC 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, Per incoming records from [facility]  . TONSILLECTOMY AND ADENOIDECTOMY          Home Medications    Prior to Admission medications   Medication Sig Start Date End Date Taking? Authorizing Provider  acetaminophen (TYLENOL) 500 MG tablet Take 500 mg by mouth 2 (two) times daily. Scheduled and every 4 hours as needed (ending 05/05/2019)Document area of discomfort and effectiveness of medication.    [provider]  atorvastatin (LIPITOR) 20 MG tablet Take 20 mg by mouth every evening.     [provider]  bisacodyl (DULCOLAX) 10 MG suppository Place 10 mg rectally as needed for moderate constipation. X 2 days when needed    [provider]  cholecalciferol (VITAMIN D3) 25 MCG (1000 UT) tablet Take 2,000 Units by mouth daily.     [provider]  DULoxetine (CYMBALTA) 30 MG capsule Take 30 mg by mouth daily.    [provider]  hydrochlorothiazide (MICROZIDE) 12.5 MG capsule Take 12.5 mg by mouth every other day.    [provider]  methenamine (MANDELAMINE) 1 g tablet Take 1,000 mg by mouth 2 (two) times daily.    [provider]  warfarin (COUMADIN) 1 MG tablet Take 7 mg by mouth every other  day. Nancy Fetter, Wed, Fri    [provider]  warfarin (COUMADIN) 3 MG tablet Take 3 mg by mouth every other day. Sammuel Cooper, [place], Sat    [provider]    Family History Family History  Problem Relation Age of Onset  . Stroke Father   . CVA Father   . Tuberculosis Father   . Neuropathy Neg Hx     Social History Social History   Tobacco Use  . Smoking status: Former Smoker    Packs/day: 3.00    Types: Cigarettes    Quit date: 06/09/1975    Years since quitting: 44.0  . Smokeless tobacco: Never Used  Substance Use Topics  . Alcohol use: Yes    Alcohol/week: 0.0 standard drinks    Comment: 2 drinks per day  . Drug use: No     Allergies   Penicillins   Review of Systems Review of Systems  Constitutional: Negative for fever.  Respiratory: Negative for shortness of breath.  Cardiovascular: Negative for chest pain.  Gastrointestinal: Positive for abdominal pain. Negative for vomiting.  Genitourinary: Positive for difficulty urinating.  Musculoskeletal: Negative for myalgias.     Physical Exam Updated Vital Signs There were no vitals taken for this visit.  Physical Exam Vitals signs and nursing note reviewed.  Constitutional:      Appearance: He is well-developed.     Comments: Uncomfortable appearing.  HENT:     Head: Normocephalic.  Neck:     Musculoskeletal: Normal range of motion and neck supple.  Cardiovascular:     Rate and Rhythm: Normal rate and regular rhythm.  Pulmonary:     Effort: Pulmonary effort is normal.     Breath sounds: Normal breath sounds. No wheezing, rhonchi or rales.  Abdominal:     General: Bowel sounds are normal.     Palpations: Abdomen is soft.     Tenderness: There is abdominal tenderness (Suprapubic, with mild distention). There is no guarding or rebound.  Musculoskeletal: Normal range of motion.  Skin:    General: Skin is warm and dry.     Findings: No rash.  Neurological:     Mental Status: He is alert.       ED Treatments / Results  Labs (all labs ordered are listed, but only abnormal results are displayed) Labs Reviewed  URINE CULTURE  URINALYSIS, ROUTINE W REFLEX MICROSCOPIC    EKG None  Radiology No results found.  Procedures Procedures (including critical care time)  Medications Ordered in ED Medications - No data to display   Initial Impression / Assessment and Plan / ED Course  I have reviewed the triage vital signs and the nursing notes.  Pertinent labs & imaging results that were available during my care of the patient were reviewed by me and considered in my medical decision making (see chart for details).        Patient to ED with urinary retention from [place].   Contact [facility]: Orie Fisherman RN - patient has indwelling foley, last changed last month (changed monthly), hematuria x 6 days with clogging of the foley today and no urine output for 5 hours. Attempt to change foley were unsuccessful. No change from baseline dementia. No fever, no vomiting.   Foley inserted without difficulty by RN. Grossly bloody urine without clotting. Chart reviewed. Last UTI with positive culture for E. coli one month ago. Question as to whether this was treated with abx. He is not ill appearing, no fever tonight, no leukocytosis. Will wait for culture to result and his provider at [facility] will be made aware via discharge instructions. Labs otherwise without significant finding.  He can be discharged back to facility. Recommend urology follow up or primary care for review of cultures.    Final Clinical Impressions(s) / ED Diagnoses   Final diagnoses:  None   1. Urinary retention 2. Coagulopathy  ED Discharge Orders    None       Charlann Lange, PA-C 06/02/19 0350    Maudie Flakes, MD 06/02/19 [phone]

## 2019-06-02 NOTE — Discharge Instructions (Addendum)
The urine sample tonight was sent for culture and will need to be reviewed by your doctor in 24-48 hours. This will determine if you need an antibiotic or not for urinary tract infection.

## 2019-06-03 DIAGNOSIS — I4891 Unspecified atrial fibrillation: Secondary | ICD-10-CM | POA: Diagnosis not present

## 2019-06-03 DIAGNOSIS — Z7901 Long term (current) use of anticoagulants: Secondary | ICD-10-CM | POA: Diagnosis not present

## 2019-06-04 LAB — URINE CULTURE: Culture: >=100000 — AB

## 2019-06-05 ENCOUNTER — Telehealth: Payer: Self-pay | Admitting: Adult Health

## 2019-06-05 ENCOUNTER — Telehealth: Payer: Self-pay | Admitting: Nurse Practitioner

## 2019-06-05 NOTE — Telephone Encounter (Signed)
Late entry for Friday 05/03/2019 call- INR 2.8, states he has been on current dose for several months. No changes in medication, diet. No abnormal bleeding or bruising noted. Will continue current regimen and follow up Thursday 06/09/2019 as this is generally INR days. Sent to Cindi Carbon NP for [employer]

## 2019-06-05 NOTE — Progress Notes (Signed)
ED Antimicrobial Stewardship Positive Culture Follow Up   Peter Singh is an 85 y.o. male who presented to [facility] on 06/02/2019 with a chief complaint of  Chief Complaint  Patient presents with  . Urinary Retention    Recent Results (from the past 720 hour(s))  Urine culture     Status: Abnormal   Collection Time: 06/02/19 12:49 AM   Specimen: Urine, Clean Catch  Result Value Ref Range Status   Specimen Description   Final    Urine Performed at [facility] [address] [place], [place] [address]    Special Requests   Final    NONE Performed at [facility], [place] [address], [place], [place] [address]    Culture >=100,000 COLONIES/mL ESCHERICHIA COLI (A)  Final   Report Status 06/04/2019 FINAL  Final   Organism ID, Bacteria ESCHERICHIA COLI (A)  Final      Susceptibility   Escherichia coli - MIC*    AMPICILLIN 8 SENSITIVE Sensitive     CEFAZOLIN <=4 SENSITIVE Sensitive     CEFEPIME <=1 SENSITIVE Sensitive     CEFTAZIDIME <=1 SENSITIVE Sensitive     CEFTRIAXONE <=1 SENSITIVE Sensitive     CIPROFLOXACIN <=0.25 SENSITIVE Sensitive     GENTAMICIN <=1 SENSITIVE Sensitive     IMIPENEM <=0.25 SENSITIVE Sensitive     TRIMETH/SULFA <=20 SENSITIVE Sensitive     AMPICILLIN/SULBACTAM <=2 SENSITIVE Sensitive     PIP/TAZO <=4 SENSITIVE Sensitive     Extended ESBL NEGATIVE Sensitive     * >=100,000 COLONIES/mL ESCHERICHIA COLI    [x]  Patient discharged originally without antimicrobial agent and treatment is now indicated  New antibiotic prescription: Keflex 500 mg PO tid x 5 days (#15) Patient needs Urology follow-up  ED Provider: Rebbeca Paul, PA-C   [facility], [name] A 06/05/2019, 2:40 PM Clinical Pharmacist [phone]

## 2019-06-06 ENCOUNTER — Encounter: Payer: Self-pay | Admitting: Adult Health

## 2019-06-06 ENCOUNTER — Non-Acute Institutional Stay (SKILLED_NURSING_FACILITY): Payer: Medicare Other | Admitting: Adult Health

## 2019-06-06 ENCOUNTER — Telehealth: Payer: Self-pay | Admitting: *Deleted

## 2019-06-06 DIAGNOSIS — R41841 Cognitive communication deficit: Secondary | ICD-10-CM | POA: Diagnosis not present

## 2019-06-06 DIAGNOSIS — R338 Other retention of urine: Secondary | ICD-10-CM | POA: Diagnosis not present

## 2019-06-06 DIAGNOSIS — M62562 Muscle wasting and atrophy, not elsewhere classified, left lower leg: Secondary | ICD-10-CM | POA: Diagnosis not present

## 2019-06-06 DIAGNOSIS — R278 Other lack of coordination: Secondary | ICD-10-CM | POA: Diagnosis not present

## 2019-06-06 DIAGNOSIS — F0280 Dementia in other diseases classified elsewhere without behavioral disturbance: Secondary | ICD-10-CM | POA: Diagnosis not present

## 2019-06-06 DIAGNOSIS — N2889 Other specified disorders of kidney and ureter: Secondary | ICD-10-CM | POA: Diagnosis not present

## 2019-06-06 DIAGNOSIS — M6389 Disorders of muscle in diseases classified elsewhere, multiple sites: Secondary | ICD-10-CM | POA: Diagnosis not present

## 2019-06-06 DIAGNOSIS — N401 Enlarged prostate with lower urinary tract symptoms: Secondary | ICD-10-CM | POA: Diagnosis not present

## 2019-06-06 DIAGNOSIS — N3001 Acute cystitis with hematuria: Secondary | ICD-10-CM | POA: Diagnosis not present

## 2019-06-06 DIAGNOSIS — R310 Gross hematuria: Secondary | ICD-10-CM

## 2019-06-06 DIAGNOSIS — M62561 Muscle wasting and atrophy, not elsewhere classified, right lower leg: Secondary | ICD-10-CM | POA: Diagnosis not present

## 2019-06-06 NOTE — Progress Notes (Signed)
Location:  Occupational psychologist of Service:  SNF (31) Provider:   Cindi Carbon, ANP [place] [phone]   Gayland Curry, DO  Patient Care Team: Gayland Curry, DO as PCP - General (Geriatric Medicine) [name], MD as PCP - Cardiology (Cardiology)  Extended Emergency Contact Information Primary Emergency Contact: Uemura,[name] Address: [address]          [place], [place] [address] [name] of [place] Phone: [phone] Mobile Phone: [phone] Relation: Spouse Secondary Emergency Contact: Vanderburg,Monico Address: [place] [address] [name] of [place] Phone: [phone] Mobile Phone: [phone] Relation: Son  Code Status:  DNR Goals of care: Advanced Directive information Advanced Directives 05/03/2019  Does Patient Have a Medical Advance Directive? Yes  Type of Advance Directive Out of facility DNR (pink MOST or yellow form);Living will  Does patient want to make changes to medical advance directive? No - Patient declined  Copy of [place] in Chart? -  Pre-existing out of facility DNR order (yellow form or pink MOST form) Yellow form placed in chart (order not valid for inpatient use);Pink MOST form placed in chart (order not valid for inpatient use)     Chief Complaint  Patient presents with   Acute Visit    f/u ER visit for clogged foley    HPI:  Pt is a 85 y.o. male seen today for an acute visit for f/u ER visit. He was transferred to the ER on 06/02/19 due to an obstructed foley. The staff attempted to change the cath without success. He was feeling pain and pressure and was sent to the ER. He had been experiencing blood in the foley bag prior to the obstruction per nsg notes. No fever or other urinary complaints. The ER staff obtained a urine specimen which grew >100,000 colonies of E coli. He was placed on Cipro for 7 days on 11/30.   He denies any bladder discomfort at this time. He is drinking well, staff report 250 cc for day shift. He is drinking and eating well per staff.  No fever. They reported increased confusion during the time of the obstruction but this is not unusual for him. His catheter today had dark brown urine.    Past Medical History:  Diagnosis Date   Adult failure to thrive    Per incoming records from [facility]   Allergic rhinitis    Per incoming records from [facility]   Atrial fibrillation [facility])    BPH (benign prostatic hyperplasia)    Per incoming records from [place] of kidney [facility])    Right, Per incoming records from [place]    Per incoming records from [facility]   Cerebrovascular accident, late effects    Per incoming records from [place]   Chronic kidney disease, stage III (moderate)    Per incoming records from [place] [facility])    Per incoming records from [facility]   Cognitive impairment    Mild, Per incoming records from [facility]   Constipation    Per incoming records from [facility]   Dementia [facility])    Diarrhea    Better off Aricept, Per incoming records from [facility]   Diastolic dysfunction    on 2D Echo  07/2009 and 2016, Per incoming records from [facility]   Diverticulosis    Mild, Per incoming records from [facility]   Diverticulosis of colon (without mention of hemorrhage)    ED (erectile dysfunction)    Per incoming records from [facility]   History of Coumadin therapy    Per incoming records from [facility]   History of echocardiogram 09/2014   Per incoming records from [facility]   Hyperlipemia    Hypertension    Kidney mass     Per incoming records from [place] leg edema    Per incoming records from [facility])    Neuropathy    Per incoming records from [place] (osteoarthritis)    Per incoming records from [facility]   Peripheral neuropathy    Per incoming records from [place] history of colonic [place] & 2004   adenomatous polyps   Pulmonary arterial hypertension ([place])    Per incoming records from [place]    Per incoming records from [facility]   Thrombocytopenia [facility]) 2017   Per incoming records from [facility]   UTI (urinary tract infection)    Sepsis, Per incoming records from [place] hypertrophy    [place] Rile as ambulation aid    Per incoming records from [facility]   Past Surgical History:  Procedure Laterality Date   APPENDECTOMY     Per incoming records from [place]     Per incoming records from [place]     right   PILONIDAL CYST DRAINAGE     POLYPECTOMY     Per incoming records from [place] tumor lumbar spine     SPINE SURGERY     tumor removed   THORACIC 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, Per incoming records from [place]      Allergies  Allergen Reactions   Penicillins Rash    Has patient had a PCN reaction causing immediate rash, facial/tongue/throat swelling, SOB or lightheadedness with hypotension: unknown Has patient had a PCN reaction causing severe rash involving mucus membranes or skin necrosis: unknown Has patient had a PCN reaction that required hospitalization : unknown Has patient had a PCN reaction occurring within  the last 10 years: no If all of the above answers are "NO", then may proceed with Cephalosporin use.     Outpatient Encounter Medications as of 06/06/2019  Medication Sig   ciprofloxacin (CIPRO) 500 MG tablet Take 500 mg by mouth 2 (two) times daily.   saccharomyces boulardii (FLORASTOR) 250 MG capsule Take 250 mg by mouth 2 (two) times daily.   warfarin (COUMADIN) 1 MG tablet Take 7 mg by mouth every other day. 7 mg Sun, W, Fri.   warfarin (COUMADIN) 1 MG tablet Take 3 mg by mouth every other day. 3 mg M, T, Th, Sat   acetaminophen (TYLENOL) 500 MG tablet Take 500 mg by mouth 2 (two) times daily. Scheduled and every 4 hours as needed (ending 05/05/2019)Document area of discomfort and effectiveness of medication.   atorvastatin (LIPITOR) 20 MG tablet Take 20 mg by mouth every evening.    bisacodyl (DULCOLAX) 10 MG suppository Place  10 mg rectally as needed for moderate constipation. X 2 days when needed   cholecalciferol (VITAMIN D3) 25 MCG (1000 UT) tablet Take 2,000 Units by mouth daily.    DULoxetine (CYMBALTA) 30 MG capsule Take 30 mg by mouth daily.   hydrochlorothiazide (MICROZIDE) 12.5 MG capsule Take 12.5 mg by mouth every other day.   methenamine (MANDELAMINE) 1 g tablet Take 1,000 mg by mouth 2 (two) times daily.   [DISCONTINUED] warfarin (COUMADIN) 7.5 MG tablet Take 3.75-7.5 mg by mouth See admin instructions. 7.5MG  Sunday, Wednesday, Friday 3.75 MG Monday, Tuesday, Thursday, saturday   No facility-administered encounter medications on file as of 06/06/2019.     Review of Systems  Constitutional: Negative for activity change, appetite change, chills, diaphoresis, fatigue and fever.  Cardiovascular: Negative for leg swelling.  Genitourinary: Positive for hematuria and scrotal swelling. Negative for difficulty urinating, discharge, dysuria, flank pain, frequency, penile pain, penile swelling, testicular pain and urgency.  Musculoskeletal: Positive for gait problem.    Psychiatric/Behavioral: Positive for confusion.    Immunization History  Administered Date(s) Administered   Influenza-Unspecified 04/17/2014, 05/01/2016, 04/15/2017   Pneumococcal Conjugate-13 04/20/2014   Pneumococcal Polysaccharide-23 07/08/2003, 03/31/2011   Pneumococcal-Unspecified 01/26/2004   Td 08/23/2007   Tdap 01/07/2012   Zoster 07/17/2005   Zoster Recombinat (Shingrix) 10/05/2017, 12/09/2017   Pertinent  Health Maintenance Due  Topic Date Due   INFLUENZA VACCINE  Completed   PNA vac Low Risk Adult  Completed   Fall Risk  05/07/2019 05/03/2019  Risk for fall due to : History of fall(s);Impaired balance/gait;Impaired mobility;Mental status change;Medication side effect History of fall(s);Impaired balance/gait;Impaired mobility;Medication side effect;Mental status change  Follow up Falls evaluation completed;Education provided;Falls prevention discussed Falls evaluation completed;Education provided;Falls prevention discussed  Comment [facility] at [facility] all addressed at [place]:    Vitals:   06/06/19 1636  Temp: 97.7 F (36.5 C)   There is no height or weight on file to calculate BMI. Physical Exam Vitals signs and nursing note reviewed.  Constitutional:      Appearance: Normal appearance.  Abdominal:     Hernia: There is no hernia in the left inguinal area or right inguinal area.  Genitourinary:    Pubic Area: No rash.      Penis: Normal.      Scrotum/Testes:        Right: Swelling present. Mass or tenderness not present.        Left: Mass not present.     Epididymis:     Right: Normal.  Musculoskeletal:     Right lower leg: Edema present.     Left lower leg: Edema present.  Lymphadenopathy:     Lower Body: No right inguinal adenopathy. No left inguinal adenopathy.  Skin:    General: Skin is warm and dry.  Neurological:     Mental Status: He is alert.     Labs reviewed: Recent Labs    03/22/19 0808  05/01/19 1037 06/02/19 0132  NA 140 140 138  K 4.2 4.4 3.8  CL 104 103 104  CO2 28 25 23   GLUCOSE 206* 178* 125*  BUN 17 27* 20  CREATININE 1.30* 1.44* 1.32*  CALCIUM 10.3 10.3 10.1   Recent Labs    12/10/18 03/22/19 0808 05/01/19 1037  AST 15 17 26   ALT 13 15 21   ALKPHOS 65 73 65  BILITOT  --  1.2 1.5*  PROT  --  6.4* 6.8  ALBUMIN  --  4.1 4.0  Recent Labs    03/22/19 0808 05/01/19 1037 06/02/19 0132  WBC 2.3* 3.5* 4.1  NEUTROABS 1.1* 2.4 3.0  HGB 16.0 15.5 15.0  HCT 48.2 47.4 46.2  MCV 95.4 95.4 93.9  PLT 82* PLATELET CLUMPS NOTED ON SMEAR, COUNT APPEARS DECREASED 78*   Lab Results  Component Value Date   TSH 1.080 07/04/2014   Lab Results  Component Value Date   HGBA1C 6.0 (H) 07/19/2014   Lab Results  Component Value Date   CHOL 137 05/24/2018   HDL 31 (A) 05/24/2018   LDLCALC 64 05/24/2018   TRIG 210 (A) 05/24/2018    Significant Diagnostic Results in last 30 days:  No results found.  Assessment/Plan 1. Urinary retention due to benign prostatic hyperplasia Recommend flushing cath with 30 cc of NS q shift as needed for low out put or obstruction. Monitor out put regularly and address to prevent obstruction. Keep coude caths available if his cath is obstructed or dislodged. This is in hopes to prevent further ER visits.   2. Gross hematuria INR is therapeutic at this time. Possibly this was due to the [facility] which grew in his urine but he also has a hx of pulling at the cath and a right renal mass followed by urology.   3. Acute cystitis with hematuria Complete 7 day course of Cipro previously ordered. INR in the morning.  4. Renal mass, right Active surveillance by urology, f/u 12/11    Family/ staff Communication: staff  Labs/tests ordered:  INR in am

## 2019-06-06 NOTE — Telephone Encounter (Signed)
Post ED Visit - Positive Culture Follow-up: Successful Patient Follow-Up  Culture assessed and recommendations reviewed by:  []  Elenor Quinones, Pharm.D. []  Heide Guile, Pharm.D., BCPS AQ-ID []  Parks Neptune, Pharm.D., BCPS []  Alycia Rossetti, Pharm.D., BCPS []  [place]man, [place].D., BCPS, AAHIVP []  Legrand Como, Pharm.D., BCPS, AAHIVP []  Salome Arnt, PharmD, BCPS []  Johnnette Gourd, PharmD, BCPS []  Hughes Better, PharmD, BCPS []  Leeroy Cha, PharmD Reuel Boom, Pharm D  Positive urine culture  [x]  Patient discharged without antimicrobial prescription and treatment is now indicated []  Organism is resistant to prescribed ED discharge antimicrobial []  Patient with positive blood cultures  Changes discussed with ED provider Eugene Gavia, PA-C New antibiotic prescription Keflex 500mg  TID x 5 days Called to [place] patient wife, date 06/06/2019, time [place], [place] 06/06/2019, 9:35 AM

## 2019-06-07 DIAGNOSIS — R41841 Cognitive communication deficit: Secondary | ICD-10-CM | POA: Diagnosis not present

## 2019-06-07 DIAGNOSIS — G6289 Other specified polyneuropathies: Secondary | ICD-10-CM | POA: Diagnosis not present

## 2019-06-07 DIAGNOSIS — F0150 Vascular dementia without behavioral disturbance: Secondary | ICD-10-CM | POA: Diagnosis not present

## 2019-06-07 DIAGNOSIS — I6789 Other cerebrovascular disease: Secondary | ICD-10-CM | POA: Diagnosis not present

## 2019-06-07 DIAGNOSIS — F0280 Dementia in other diseases classified elsewhere without behavioral disturbance: Secondary | ICD-10-CM | POA: Diagnosis not present

## 2019-06-07 DIAGNOSIS — M6389 Disorders of muscle in diseases classified elsewhere, multiple sites: Secondary | ICD-10-CM | POA: Diagnosis not present

## 2019-06-07 DIAGNOSIS — R601 Generalized edema: Secondary | ICD-10-CM | POA: Diagnosis not present

## 2019-06-07 DIAGNOSIS — R278 Other lack of coordination: Secondary | ICD-10-CM | POA: Diagnosis not present

## 2019-06-08 DIAGNOSIS — R41841 Cognitive communication deficit: Secondary | ICD-10-CM | POA: Diagnosis not present

## 2019-06-08 DIAGNOSIS — F0280 Dementia in other diseases classified elsewhere without behavioral disturbance: Secondary | ICD-10-CM | POA: Diagnosis not present

## 2019-06-08 DIAGNOSIS — M6389 Disorders of muscle in diseases classified elsewhere, multiple sites: Secondary | ICD-10-CM | POA: Diagnosis not present

## 2019-06-08 DIAGNOSIS — R278 Other lack of coordination: Secondary | ICD-10-CM | POA: Diagnosis not present

## 2019-06-08 DIAGNOSIS — F0150 Vascular dementia without behavioral disturbance: Secondary | ICD-10-CM | POA: Diagnosis not present

## 2019-06-08 DIAGNOSIS — R601 Generalized edema: Secondary | ICD-10-CM | POA: Diagnosis not present

## 2019-06-09 DIAGNOSIS — F0150 Vascular dementia without behavioral disturbance: Secondary | ICD-10-CM | POA: Diagnosis not present

## 2019-06-09 DIAGNOSIS — R278 Other lack of coordination: Secondary | ICD-10-CM | POA: Diagnosis not present

## 2019-06-09 DIAGNOSIS — F0280 Dementia in other diseases classified elsewhere without behavioral disturbance: Secondary | ICD-10-CM | POA: Diagnosis not present

## 2019-06-09 DIAGNOSIS — M6389 Disorders of muscle in diseases classified elsewhere, multiple sites: Secondary | ICD-10-CM | POA: Diagnosis not present

## 2019-06-09 DIAGNOSIS — R601 Generalized edema: Secondary | ICD-10-CM | POA: Diagnosis not present

## 2019-06-09 DIAGNOSIS — R41841 Cognitive communication deficit: Secondary | ICD-10-CM | POA: Diagnosis not present

## 2019-06-10 ENCOUNTER — Non-Acute Institutional Stay (SKILLED_NURSING_FACILITY): Payer: Medicare Other | Admitting: Adult Health

## 2019-06-10 ENCOUNTER — Encounter: Payer: Self-pay | Admitting: Adult Health

## 2019-06-10 DIAGNOSIS — I4891 Unspecified atrial fibrillation: Secondary | ICD-10-CM | POA: Diagnosis not present

## 2019-06-10 DIAGNOSIS — I4811 Longstanding persistent atrial fibrillation: Secondary | ICD-10-CM | POA: Diagnosis not present

## 2019-06-10 DIAGNOSIS — Z7901 Long term (current) use of anticoagulants: Secondary | ICD-10-CM | POA: Diagnosis not present

## 2019-06-10 LAB — POCT INR: INR: 2.8 — AB (ref 0.9–1.1)

## 2019-06-10 LAB — PROTIME-INR: Protime: 29.7 — AB (ref 10.0–13.8)

## 2019-06-10 NOTE — Progress Notes (Signed)
Location:  Occupational psychologist of Service:  SNF (31) Provider:   Cindi Carbon, ANP [place] [phone]   Gayland Curry, DO  Patient Care Team: Gayland Curry, DO as PCP - General (Geriatric Medicine) [name], MD as PCP - Cardiology (Cardiology)  Extended Emergency Contact Information Primary Emergency Contact: Buffa,[name] Address: [address]          [place],  [address] [name] of [place] Phone: [phone] Mobile Phone: [phone] Relation: Spouse Secondary Emergency Contact: Frerking,Onyekachi Address: [place] [address] [name] of [place] Phone: [phone] Mobile Phone: [phone] Relation: Son  Code Status:  DNR Goals of care: Advanced Directive information Advanced Directives 05/03/2019  Does Patient Have a Medical Advance Directive? Yes  Type of Advance Directive Out of facility DNR (pink MOST or yellow form);Living will  Does patient want to make changes to medical advance directive? No - Patient declined  Copy of [place] in Chart? -  Pre-existing out of facility DNR order (yellow form or pink MOST form) Yellow form placed in chart (order not valid for inpatient use);Pink MOST form placed in chart (order not valid for inpatient use)     Chief Complaint  Patient presents with   Acute Visit    couamdin management    HPI:  Pt is a 85 y.o. male seen today for an acute visit for coumadin management. He has a hx of longstanding afib and is taking coumadin 3 mg qd, which was reduced due to antibiotic therapy.  Currently on Cipro for UTI >100,000 colonies of e coli. 7 days of tx started on 11/29  Lab Results  Component Value Date   INR 2.8 (A) 06/10/2019   INR 2.9 (H) 06/02/2019   INR 2.3 (H) 05/01/2019   PROTIME 29.7 (A) 06/10/2019   PROTIME 22.2 (A) 10/13/2016   PROTIME 25.6 (A) 04/03/2016     Past Medical History:   Diagnosis Date   Adult failure to thrive    Per incoming records from [facility]   Allergic rhinitis    Per incoming records from [facility]   Atrial fibrillation [facility])    BPH (benign prostatic hyperplasia)    Per incoming records from [place] [facility])    Right, Per incoming records from [place]    [facility]   Cerebrovascular accident, late effects    Per incoming records from [place]   Chronic kidney disease, stage III (moderate)    Per incoming records from [facility])    Per incoming records from [facility]   Cognitive impairment    Mild, Per incoming records from [facility]   Constipation    Per incoming records from [facility]   Dementia [facility])    Diarrhea    Better off Aricept, Per incoming records from [facility]   Diastolic dysfunction    on 2D Echo 07/2009 and 2016, Per incoming records from [facility]   Diverticulosis    Mild, Per incoming records from [facility]   Diverticulosis of colon (without mention of hemorrhage)    ED (erectile dysfunction)    Per incoming records from [facility]   History of Coumadin therapy  Per incoming records from [facility]   History of echocardiogram 09/2014   Per incoming records from [facility]   Hyperlipemia    Hypertension    Kidney mass    Per incoming records from [place] leg edema    Per incoming records from [facility])    Neuropathy    Per incoming records from [place] (osteoarthritis)    Per incoming records from [facility]   Peripheral  neuropathy    Per incoming records from [place] history of colonic [place] & 2004   adenomatous polyps   Pulmonary arterial hypertension ([place])    Per incoming records from [place]    Per incoming records from [facility]   Thrombocytopenia [facility]) 2017   Per incoming records from [facility]   UTI (urinary tract infection)    Sepsis, Per incoming records from [place] as ambulation aid    Per incoming records from [facility]   Past Surgical History:  Procedure Laterality Date   APPENDECTOMY     Per incoming records from [place]     Per incoming records from [place]     right   PILONIDAL CYST DRAINAGE     POLYPECTOMY     Per incoming records from [place] tumor lumbar spine     SPINE SURGERY     tumor removed   THORACIC 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, Per incoming records from [place]      Allergies  Allergen Reactions   Penicillins Rash    Has patient had a PCN reaction causing immediate rash, facial/tongue/throat swelling, SOB or lightheadedness with hypotension: unknown Has patient had a PCN reaction causing severe rash involving mucus membranes or skin necrosis: unknown Has patient had a PCN reaction that required hospitalization : unknown Has patient had a PCN reaction occurring within the last 10 years: no If all of the above answers are "NO", then may proceed with Cephalosporin use.     Outpatient Encounter Medications as of 06/10/2019  Medication Sig   acetaminophen (TYLENOL) 500 MG tablet Take 500 mg by mouth 2 (two) times daily. Scheduled and every 4 hours as needed (ending  05/05/2019)Document area of discomfort and effectiveness of medication.   atorvastatin (LIPITOR) 20 MG tablet Take 20 mg by mouth every evening.    bisacodyl (DULCOLAX) 10 MG suppository Place 10 mg rectally as needed for moderate constipation. X 2 days when needed   cholecalciferol (VITAMIN D3) 25 MCG (1000 UT) tablet Take 2,000 Units by mouth daily.    ciprofloxacin (CIPRO) 500 MG tablet Take 500 mg by mouth 2 (two) times daily.   DULoxetine (CYMBALTA) 30 MG capsule Take 30 mg by mouth daily.   hydrochlorothiazide (MICROZIDE) 12.5 MG capsule Take 12.5 mg by mouth every other day.   methenamine (MANDELAMINE) 1 g tablet Take 1,000 mg by mouth 2 (two) times daily.   saccharomyces boulardii (FLORASTOR) 250 MG capsule Take 250 mg by mouth 2 (two) times daily.   warfarin (COUMADIN) 1 MG tablet Take 3 mg by mouth daily at 6 PM.    [DISCONTINUED] warfarin (COUMADIN) 1 MG tablet  Take 7 mg by mouth every other day. 7 mg Sun, W, Fri.   No facility-administered encounter medications on file as of 06/10/2019.     Review of Systems  Musculoskeletal: Positive for gait problem.  Hematological: Does not bruise/bleed easily.  Psychiatric/Behavioral: Positive for confusion.    Immunization History  Administered Date(s) Administered   Influenza-Unspecified 04/17/2014, 05/01/2016, 04/15/2017   Pneumococcal Conjugate-13 04/20/2014   Pneumococcal Polysaccharide-23 07/08/2003, 03/31/2011   Pneumococcal-Unspecified 01/26/2004   Td 08/23/2007   Tdap 01/07/2012   Zoster 07/17/2005   Zoster Recombinat (Shingrix) 10/05/2017, 12/09/2017   Pertinent  Health Maintenance Due  Topic Date Due   INFLUENZA VACCINE  Completed   PNA vac Low Risk Adult  Completed   Fall Risk  05/07/2019 05/03/2019  Risk for fall due to : History of fall(s);Impaired balance/gait;Impaired mobility;Mental status change;Medication side effect History of fall(s);Impaired balance/gait;Impaired mobility;Medication side  effect;Mental status change  Follow up Falls evaluation completed;Education provided;Falls prevention discussed Falls evaluation completed;Education provided;Falls prevention discussed  [facility] all addressed at [place]:    There were no vitals filed for this visit. There is no height or weight on file to calculate BMI. Physical Exam Vitals signs reviewed.  Neurological:     Mental Status: He is alert.     Labs reviewed: Recent Labs    03/22/19 0808 05/01/19 1037 06/02/19 0132  NA 140 140 138  K 4.2 4.4 3.8  CL 104 103 104  CO2 28 25 23   GLUCOSE 206* 178* 125*  BUN 17 27* 20  CREATININE 1.30* 1.44* 1.32*  CALCIUM 10.3 10.3 10.1   Recent Labs    12/10/18 03/22/19 0808 05/01/19 1037  AST 15 17 26   ALT 13 15 21   ALKPHOS 65 73 65  BILITOT  --  1.2 1.5*  PROT  --  6.4* 6.8  ALBUMIN  --  4.1 4.0   Recent Labs    03/22/19 0808 05/01/19 1037 06/02/19 0132  WBC 2.3* 3.5* 4.1  NEUTROABS 1.1* 2.4 3.0  HGB 16.0 15.5 15.0  HCT 48.2 47.4 46.2  MCV 95.4 95.4 93.9  PLT 82* PLATELET CLUMPS NOTED ON SMEAR, COUNT APPEARS DECREASED 78*   Lab Results  Component Value Date   TSH 1.080 07/04/2014   Lab Results  Component Value Date   HGBA1C 6.0 (H) 07/19/2014   Lab Results  Component Value Date   CHOL 137 05/24/2018   HDL 31 (A) 05/24/2018   LDLCALC 64 05/24/2018   TRIG 210 (A) 05/24/2018    Significant Diagnostic Results in last 30 days:  No results found.  Assessment/Plan 1. Chronic anticoagulation Continue coumadin 3 mg qd and recheck INR in 1 week   2. Longstanding persistent atrial fibrillation (HCC) CHA2DS2-VASc score 6 Rate is controlled, coumadin is for CVA risk reduction     Family/ staff Communication: staff  Labs/tests ordered:  INR 1 week

## 2019-06-15 DIAGNOSIS — F0150 Vascular dementia without behavioral disturbance: Secondary | ICD-10-CM | POA: Diagnosis not present

## 2019-06-15 DIAGNOSIS — R41841 Cognitive communication deficit: Secondary | ICD-10-CM | POA: Diagnosis not present

## 2019-06-15 DIAGNOSIS — M6389 Disorders of muscle in diseases classified elsewhere, multiple sites: Secondary | ICD-10-CM | POA: Diagnosis not present

## 2019-06-15 DIAGNOSIS — F0280 Dementia in other diseases classified elsewhere without behavioral disturbance: Secondary | ICD-10-CM | POA: Diagnosis not present

## 2019-06-15 DIAGNOSIS — R278 Other lack of coordination: Secondary | ICD-10-CM | POA: Diagnosis not present

## 2019-06-15 DIAGNOSIS — R601 Generalized edema: Secondary | ICD-10-CM | POA: Diagnosis not present

## 2019-06-16 DIAGNOSIS — R41841 Cognitive communication deficit: Secondary | ICD-10-CM | POA: Diagnosis not present

## 2019-06-16 DIAGNOSIS — F0280 Dementia in other diseases classified elsewhere without behavioral disturbance: Secondary | ICD-10-CM | POA: Diagnosis not present

## 2019-06-16 DIAGNOSIS — R601 Generalized edema: Secondary | ICD-10-CM | POA: Diagnosis not present

## 2019-06-16 DIAGNOSIS — F0150 Vascular dementia without behavioral disturbance: Secondary | ICD-10-CM | POA: Diagnosis not present

## 2019-06-16 DIAGNOSIS — M6389 Disorders of muscle in diseases classified elsewhere, multiple sites: Secondary | ICD-10-CM | POA: Diagnosis not present

## 2019-06-16 DIAGNOSIS — R278 Other lack of coordination: Secondary | ICD-10-CM | POA: Diagnosis not present

## 2019-06-17 DIAGNOSIS — C641 Malignant neoplasm of right kidney, except renal pelvis: Secondary | ICD-10-CM | POA: Diagnosis not present

## 2019-06-17 LAB — POCT INR: INR: 2.9 — AB (ref <=1.1)

## 2019-06-23 ENCOUNTER — Non-Acute Institutional Stay (SKILLED_NURSING_FACILITY): Payer: Medicare Other | Admitting: Adult Health

## 2019-06-23 ENCOUNTER — Encounter: Payer: Self-pay | Admitting: Adult Health

## 2019-06-23 DIAGNOSIS — M25561 Pain in right knee: Secondary | ICD-10-CM

## 2019-06-23 DIAGNOSIS — I10 Essential (primary) hypertension: Secondary | ICD-10-CM

## 2019-06-23 DIAGNOSIS — D696 Thrombocytopenia, unspecified: Secondary | ICD-10-CM

## 2019-06-23 DIAGNOSIS — R338 Other retention of urine: Secondary | ICD-10-CM

## 2019-06-23 DIAGNOSIS — Z0000 Encounter for general adult medical examination without abnormal findings: Secondary | ICD-10-CM | POA: Diagnosis not present

## 2019-06-23 DIAGNOSIS — G8929 Other chronic pain: Secondary | ICD-10-CM

## 2019-06-23 DIAGNOSIS — N401 Enlarged prostate with lower urinary tract symptoms: Secondary | ICD-10-CM

## 2019-06-23 DIAGNOSIS — F0150 Vascular dementia without behavioral disturbance: Secondary | ICD-10-CM

## 2019-06-23 DIAGNOSIS — M25562 Pain in left knee: Secondary | ICD-10-CM

## 2019-06-23 NOTE — Patient Instructions (Signed)
Peter Singh , Thank you for taking time to come for your Medicare Wellness Visit. I appreciate your ongoing commitment to your health goals. Please review the following plan we discussed and let me know if I can assist you in the future.   Screening recommendations/referrals: Colonoscopy aged out Recommended yearly ophthalmology/optometry visit for glaucoma screening and checkup Recommended yearly dental visit for hygiene and checkup  Vaccinations: Influenza vaccine completed Pneumococcal vaccine up to date Tdap vaccine up to date Shingles vaccine up to date  Advanced directives: reviewed  Conditions/risks identified: fall risk  Next appointment: 1 year for AWV  Preventive Care 41 Years and Older, Male Preventive care refers to lifestyle choices and visits with your health care provider that can promote health and wellness. What does preventive care include?  A yearly physical exam. This is also called an annual well check.  Dental exams once or twice a year.  Routine eye exams. Ask your health care provider how often you should have your eyes checked.  Personal lifestyle choices, including:  Daily care of your teeth and gums.  Regular physical activity.  Eating a healthy diet.  Avoiding tobacco and drug use.  Limiting alcohol use.  Practicing safe sex.  Taking low doses of aspirin every day.  Taking vitamin and mineral supplements as recommended by your health care provider. What happens during an annual well check? The services and screenings done by your health care provider during your annual well check will depend on your age, overall health, lifestyle risk factors, and family history of disease. Counseling  Your health care provider may ask you questions about your:  Alcohol use.  Tobacco use.  Drug use.  Emotional well-being.  Home and relationship well-being.  Sexual activity.  Eating habits.  History of falls.  Memory and ability to  understand (cognition).  Work and work Statistician. Screening  You may have the following tests or measurements:  Height, weight, and BMI.  Blood pressure.  Lipid and cholesterol levels. These may be checked every 5 years, or more frequently if you are over 52 years old.  Skin check.  Lung cancer screening. You may have this screening every year starting at age 74 if you have a 30-pack-year history of smoking and currently smoke or have quit within the past 15 years.  Fecal occult blood test (FOBT) of the stool. You may have this test every year starting at age 63.  Flexible sigmoidoscopy or colonoscopy. You may have a sigmoidoscopy every 5 years or a colonoscopy every 10 years starting at age 57.  Prostate cancer screening. Recommendations will vary depending on your family history and other risks.  Hepatitis C blood test.  Hepatitis B blood test.  Sexually transmitted disease (STD) testing.  Diabetes screening. This is done by checking your blood sugar (glucose) after you have not eaten for a while (fasting). You may have this done every 1-3 years.  Abdominal aortic aneurysm (AAA) screening. You may need this if you are a current or former smoker.  Osteoporosis. You may be screened starting at age 69 if you are at high risk. Talk with your health care provider about your test results, treatment options, and if necessary, the need for more tests. Vaccines  Your health care provider may recommend certain vaccines, such as:  Influenza vaccine. This is recommended every year.  Tetanus, diphtheria, and acellular pertussis (Tdap, Td) vaccine. You may need a Td booster every 10 years.  Zoster vaccine. You may need this after age  60.  Pneumococcal 13-valent conjugate (PCV13) vaccine. One dose is recommended after age 35.  Pneumococcal polysaccharide (PPSV23) vaccine. One dose is recommended after age 94. Talk to your health care provider about which screenings and vaccines you  need and how often you need them. This information is not intended to replace advice given to you by your health care provider. Make sure you discuss any questions you have with your health care provider. Document Released: 07/20/2015 Document Revised: 03/12/2016 Document Reviewed: 04/24/2015 Elsevier Interactive Patient Education  2017 [place] Prevention in the Home Falls can cause injuries. They can happen to people of all ages. There are many things you can do to make your home safe and to help prevent falls. What can I do on the outside of my home?  Regularly fix the edges of walkways and driveways and fix any cracks.  Remove anything that might make you trip as you walk through a door, such as a raised step or threshold.  Trim any bushes or trees on the path to your home.  Use bright outdoor lighting.  Clear any walking paths of anything that might make someone trip, such as rocks or tools.  Regularly check to see if handrails are loose or broken. Make sure that both sides of any steps have handrails.  Any raised decks and porches should have guardrails on the edges.  Have any leaves, snow, or ice cleared regularly.  Use sand or salt on walking paths during winter.  Clean up any spills in your garage right away. This includes oil or grease spills. What can I do in the bathroom?  Use night lights.  Install grab bars by the toilet and in the tub and shower. Do not use towel bars as grab bars.  Use non-skid mats or decals in the tub or shower.  If you need to sit down in the shower, use a plastic, non-slip stool.  Keep the floor dry. Clean up any water that spills on the floor as soon as it happens.  Remove soap buildup in the tub or shower regularly.  Attach bath mats securely with double-sided non-slip rug tape.  Do not have throw rugs and other things on the floor that can make you trip. What can I do in the bedroom?  Use night lights.  Make sure  that you have a light by your bed that is easy to reach.  Do not use any sheets or blankets that are too big for your bed. They should not hang down onto the floor.  Have a firm chair that has side arms. You can use this for support while you get dressed.  Do not have throw rugs and other things on the floor that can make you trip. What can I do in the kitchen?  Clean up any spills right away.  Avoid walking on wet floors.  Keep items that you use a lot in easy-to-reach places.  If you need to reach something above you, use a strong step stool that has a grab bar.  Keep electrical cords out of the way.  Do not use floor polish or wax that makes floors slippery. If you must use wax, use non-skid floor wax.  Do not have throw rugs and other things on the floor that can make you trip. What can I do with my stairs?  Do not leave any items on the stairs.  Make sure that there are handrails on both sides of the stairs and  use them. Fix handrails that are broken or loose. Make sure that handrails are as long as the stairways.  Check any carpeting to make sure that it is firmly attached to the stairs. Fix any carpet that is loose or worn.  Avoid having throw rugs at the top or bottom of the stairs. If you do have throw rugs, attach them to the floor with carpet tape.  Make sure that you have a light switch at the top of the stairs and the bottom of the stairs. If you do not have them, ask someone to add them for you. What else can I do to help prevent falls?  Wear shoes that:  Do not have high heels.  Have rubber bottoms.  Are comfortable and fit you well.  Are closed at the toe. Do not wear sandals.  If you use a stepladder:  Make sure that it is fully opened. Do not climb a closed stepladder.  Make sure that both sides of the stepladder are locked into place.  Ask someone to hold it for you, if possible.  Clearly mark and make sure that you can see:  Any grab bars or  handrails.  First and last steps.  Where the edge of each step is.  Use tools that help you move around (mobility aids) if they are needed. These include:  Canes.  Walkers.  Scooters.  Crutches.  Turn on the lights when you go into a dark area. Replace any light bulbs as soon as they burn out.  Set up your furniture so you have a clear path. Avoid moving your furniture around.  If any of your floors are uneven, fix them.  If there are any pets around you, be aware of where they are.  Review your medicines with your doctor. Some medicines can make you feel dizzy. This can increase your chance of falling. Ask your doctor what other things that you can do to help prevent falls. This information is not intended to replace advice given to you by your health care provider. Make sure you discuss any questions you have with your health care provider. Document Released: 04/19/2009 Document Revised: 11/29/2015 Document Reviewed: 07/28/2014 Elsevier Interactive Patient Education  2017 [employer].

## 2019-06-23 NOTE — Progress Notes (Signed)
Location:  Occupational psychologist of Service:  SNF (31) Provider:  Cindi Carbon, ANP [place] [phone]  Gayland Curry, DO  Patient Care Team: Gayland Curry, DO as PCP - General (Geriatric Medicine) [name], MD as PCP - Cardiology (Cardiology)  Extended Emergency Contact Information Primary Emergency Contact: Strum,[name] Address: [address]          [place], [place] [address] [name] of [place] Phone: [phone] Mobile Phone: [phone] Relation: Spouse Secondary Emergency Contact: Volner,Claudius Address: [place] [address] [name] of [place] Phone: [phone] Mobile Phone: [phone] Relation: Son  Code Status:  DNR Goals of care: Advanced Directive information Advanced Directives 06/23/2019  Does Patient Have a Medical Advance Directive? Yes  Type of Paramedic of [place];Living will;Out of facility DNR (pink MOST or yellow form)  Does patient want to make changes to medical advance directive? No - Patient declined  Copy of [place] in Chart? Yes - validated most recent copy scanned in chart (See row information)  Pre-existing out of facility DNR order (yellow form or pink MOST form) -     Chief Complaint  Patient presents with  . Medical Management of Chronic Issues    HPI:  Pt is a 85 y.o. male seen today for medical management of chronic diseases.    Vascular dementia: He seems to be adjusting well to the skilled care unit. He has no concerns for behavioral issues at this time. MMSE 21/30 with a failed clock. He is confused about his current status in the skilled care unit and sometimes requests to go home. He previously lived with his wife and she is no longer able to care for him.   BPH with retention: last night he had a since of urgency. The nurse flushed his catheter with 320 cc return of clear  yellow urine. He now denies any discomfort and is afebrile. He was treated for a UTI with cipro in November.   Thrombocytopenia: no reports of bleeding, currently on coumadin Lab Results  Component Value Date   PLT 78 (L) 06/02/2019   Chronic knee pain: controlled with Tylenol. Ambulating with a walker  Bp controlled in review of readings in epic  Past Medical History:  Diagnosis Date  . Adult failure to thrive    Per incoming records from [facility]  . Allergic rhinitis    Per incoming records from [facility]  . Atrial fibrillation ([place])   . BPH (benign prostatic hyperplasia)    Per incoming records from [facility]  . Cancer of kidney [facility])    Right, Per incoming records from [facility]  . Cataract    Per incoming records from [facility]  . Cerebrovascular accident, late effects    Per incoming records from [facility]  . Chronic kidney disease, stage III (moderate)    Per incoming records from [facility]  . Claudication [facility])    Per incoming records from [facility]  . Cognitive impairment    Mild, Per incoming records from [facility]  . Constipation    Per incoming records from [facility]  . Dementia ([place])   . Diarrhea    Better off Aricept, Per incoming records from [facility]  . Diastolic dysfunction    on 2D Echo 07/2009  and 2016, Per incoming records from [facility]  . Diverticulosis    Mild, Per incoming records from [facility]  . Diverticulosis of colon (without mention of hemorrhage)   . ED (erectile dysfunction)    Per incoming records from [facility]  . History of Coumadin therapy    Per incoming records from [facility]  . History of echocardiogram 09/2014   Per incoming records from [facility]   . Hyperlipemia   . Hypertension   . Kidney mass    Per incoming records from [facility]  . Lower leg edema    Per incoming records from [facility]  . Melanoma ([place])   . Neuropathy    Per incoming records from [facility]  . OA (osteoarthritis)    Per incoming records from [facility]  . Peripheral neuropathy    Per incoming records from [facility]  . Personal history of colonic polyps 1999 & 2004   adenomatous polyps  . Pulmonary arterial hypertension ([place])    Per incoming records from [facility]  . Rosacea    Per incoming records from [facility]  . Thrombocytopenia ([place]) 2017   Per incoming records from [facility]  . UTI (urinary tract infection)    Sepsis, Per incoming records from [facility]  . Ventricular hypertrophy   . Walker as ambulation aid    Per incoming records from [employer]   Past Surgical History:  Procedure Laterality Date  . APPENDECTOMY     Per incoming records from [facility]  . CATARACT EXTRACTION     Per incoming records from [facility]  . KNEE SURGERY     right  . PILONIDAL CYST DRAINAGE    . POLYPECTOMY     Per incoming records from [facility]  . schwanoma tumor lumbar spine    . SPINE SURGERY     tumor removed  . THORACIC 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, Per incoming records from [facility]  . TONSILLECTOMY AND ADENOIDECTOMY      Allergies  Allergen Reactions  . Penicillins Rash    Has patient had a PCN reaction causing immediate rash, facial/tongue/throat swelling, SOB or lightheadedness with hypotension: unknown Has patient had a PCN reaction causing severe rash involving mucus membranes or skin necrosis: unknown Has patient had a PCN reaction that required hospitalization :  unknown Has patient had a PCN reaction occurring within the last 10 years: no If all of the above answers are "NO", then may proceed with Cephalosporin use.     Outpatient Encounter Medications as of 06/23/2019  Medication Sig  . acetaminophen (TYLENOL) 500 MG tablet Take 500 mg by mouth 2 (two) times daily. Scheduled and every 4 hours as needed (ending 05/05/2019)Document area of discomfort and effectiveness of medication.  Marland Kitchen atorvastatin (LIPITOR) 20 MG tablet Take 20 mg by mouth every evening.   . bisacodyl (DULCOLAX) 10 MG suppository Place 10 mg rectally as needed for moderate constipation. X 2 days when needed  . cholecalciferol (VITAMIN D3) 25 MCG (1000 UT) tablet Take 2,000 Units by mouth daily.   . DULoxetine (CYMBALTA) 30 MG capsule Take 30 mg by mouth daily.  . hydrochlorothiazide (MICROZIDE) 12.5 MG capsule Take 12.5 mg by mouth every other day.  . methenamine (MANDELAMINE) 1 g tablet Take 1,000 mg by mouth 2 (two) times daily.  Marland Kitchen  warfarin (COUMADIN) 1 MG tablet Take 3 mg by mouth daily at 6 PM.    No facility-administered encounter medications on file as of 06/23/2019.    Review of Systems  Constitutional: Negative for activity change, appetite change, chills, diaphoresis, fatigue, fever and unexpected weight change.  Respiratory: Negative for cough, shortness of breath, wheezing and stridor.   Cardiovascular: Positive for leg swelling. Negative for chest pain and palpitations.  Gastrointestinal: Negative for abdominal distention, abdominal pain, constipation and diarrhea.  Genitourinary: Negative for difficulty urinating and dysuria.       Urgency now resolved  Musculoskeletal: Positive for gait problem. Negative for arthralgias, back pain, joint swelling and myalgias.       Chronic knee pain  Skin: Negative for rash.  Neurological: Negative for dizziness, seizures, syncope, facial asymmetry, speech difficulty, weakness and headaches.  Hematological: Negative for  adenopathy. Does not bruise/bleed easily.  Psychiatric/Behavioral: Positive for confusion. Negative for agitation and behavioral problems.    Immunization History  Administered Date(s) Administered  . Influenza-Unspecified 04/17/2014, 05/01/2016, 04/15/2019  . Pneumococcal Conjugate-13 04/20/2014  . Pneumococcal Polysaccharide-23 07/08/2003, 03/31/2011  . Pneumococcal-Unspecified 01/26/2004  . Td 08/23/2007  . Tdap 01/07/2012  . Zoster 07/17/2005  . Zoster Recombinat (Shingrix) 10/05/2017, 12/09/2017   Pertinent  Health Maintenance Due  Topic Date Due  . INFLUENZA VACCINE  Completed  . PNA vac Low Risk Adult  Completed   Fall Risk  06/23/2019 05/07/2019 05/03/2019  Falls in the past year? 1 - -  Number falls in past yr: 1 - -  Injury with Fall? 1 - -  Risk for fall due to : History of fall(s);Impaired balance/gait;Impaired mobility History of fall(s);Impaired balance/gait;Impaired mobility;Mental status change;Medication side effect History of fall(s);Impaired balance/gait;Impaired mobility;Medication side effect;Mental status change  Follow up Falls evaluation completed Falls evaluation completed;Education provided;Falls prevention discussed Falls evaluation completed;Education provided;Falls prevention discussed  Comment - [facility] all addressed at [place]:    Vitals:   06/23/19 1026  Weight: 246 lb 3.2 oz (111.7 kg)   Body mass index is 27.74 kg/m.  Wt Readings from Last 3 Encounters:  06/23/19 246 lb 3.2 oz (111.7 kg)  06/23/19 246 lb 3.2 oz (111.7 kg)  06/02/19 280 lb (127 kg)  reading of 280 in epic is not likely accurate  Physical Exam Constitutional:      General: He is not in acute distress.    Appearance: He is not diaphoretic.  HENT:     Head: Normocephalic and atraumatic.  Neck:     Thyroid: No thyromegaly.     Vascular: No JVD.     Trachea: No tracheal deviation.  Cardiovascular:     Rate and Rhythm: Normal  rate. Rhythm irregular.     Heart sounds: No murmur.  Pulmonary:     Effort: Pulmonary effort is normal. No respiratory distress.     Breath sounds: Normal breath sounds. No wheezing.  Abdominal:     General: Bowel sounds are normal. There is no distension.     Palpations: Abdomen is soft.     Tenderness: There is no abdominal tenderness. There is no right CVA tenderness or left CVA tenderness.  Genitourinary:    Comments: Leg bag with clear yellow urine Musculoskeletal:     Cervical back: Normal range of motion and neck supple.     Right lower leg: Edema present.     Left lower leg: Edema present.  Lymphadenopathy:     Cervical: No cervical adenopathy.  Skin:    General: Skin is warm and dry.  Neurological:     General: No focal deficit present.     Mental Status: He is alert. Mental status is at baseline.  Psychiatric:        Mood and Affect: Mood normal.     Labs reviewed: Recent Labs    03/22/19 0808 05/01/19 1037 06/02/19 0132  NA 140 140 138  K 4.2 4.4 3.8  CL 104 103 104  CO2 28 25 23   GLUCOSE 206* 178* 125*  BUN 17 27* 20  CREATININE 1.30* 1.44* 1.32*  CALCIUM 10.3 10.3 10.1   Recent Labs    12/10/18 0000 03/22/19 0808 05/01/19 1037  AST 15 17 26   ALT 13 15 21   ALKPHOS 65 73 65  BILITOT  --  1.2 1.5*  PROT  --  6.4* 6.8  ALBUMIN  --  4.1 4.0   Recent Labs    03/22/19 0808 05/01/19 1037 06/02/19 0132  WBC 2.3* 3.5* 4.1  NEUTROABS 1.1* 2.4 3.0  HGB 16.0 15.5 15.0  HCT 48.2 47.4 46.2  MCV 95.4 95.4 93.9  PLT 82* PLATELET CLUMPS NOTED ON SMEAR, COUNT APPEARS DECREASED 78*   Lab Results  Component Value Date   TSH 1.080 07/04/2014   Lab Results  Component Value Date   HGBA1C 6.0 (H) 07/19/2014   Lab Results  Component Value Date   CHOL 137 05/24/2018   HDL 31 (A) 05/24/2018   LDLCALC 64 05/24/2018   TRIG 210 (A) 05/24/2018    Significant Diagnostic Results in last 30 days:  No results found.  Assessment/Plan 1. Vascular dementia  without behavioral disturbance (HCC) Moderate stage Doing well in skilled care Continue supportive care. He is no longer on memory medications and his dementia had been progressing which necessitated his move to the unit.   2. Essential hypertension Controlled Continue hctz 12.5 mg qod   3. Urinary retention due to benign prostatic hyperplasia Continue catheter management which includes the nurses monitoring output and patency of the catheter and flushing when needed.   4. Thrombocytopenia ([place]) He is followed by [name] for this and for leukocytopenia. The differential includes ITP, myleodysplasia, and liver disease due to etoh. Continue to monitor periodically, noted on anticoagulation for afib.   5. Chronic pain of both knees Controlled, continue scheduled tylenol    Family/ staff Communication:discussed with his nurse  Labs/tests ordered:  INR 12/22

## 2019-06-23 NOTE — Progress Notes (Signed)
Subjective:   Peter Singh is a 85 y.o. male who presents for Medicare Annual/Subsequent preventive examination. He resides in skilled care at [facility].   Review of Systems:   Cardiac Risk Factors include: advanced age (>85men, >37 women);sedentary lifestyle;male gender;hypertension     Objective:    Vitals: Wt 246 lb 3.2 oz (111.7 kg)   BMI 27.74 kg/m   Body mass index is 27.74 kg/m.  Advanced Directives 06/23/2019 05/03/2019 05/01/2019 10/14/2016 10/07/2016 10/07/2016 05/22/2016  Does Patient Have a Medical Advance Directive? Yes Yes Yes Yes Yes Yes Yes  Type of Paramedic of [place];Living will;Out of facility DNR (pink MOST or yellow form) Out of facility DNR (pink MOST or yellow form);Living will Out of facility DNR (pink MOST or yellow form) Out of facility DNR (pink MOST or yellow form);Living will;Healthcare Power of [place] of facility DNR (pink MOST or yellow form);Living will [place];Living will;Out of facility DNR (pink MOST or yellow form)  Does patient want to make changes to medical advance directive? No - Patient declined No - Patient declined - - No - Patient declined No - Patient declined No - Patient declined  Copy of [place] in Chart? Yes - validated most recent copy scanned in chart (See row information) - - Yes No - copy requested - Yes  Pre-existing out of facility DNR order (yellow form or pink MOST form) - Yellow form placed in chart (order not valid for inpatient use);Pink MOST form placed in chart (order not valid for inpatient use) - Yellow form placed in chart (order not valid for inpatient use);Pink MOST form placed in chart (order not valid for inpatient use) - Yellow form placed in chart (order not valid for inpatient use);Pink MOST form placed in chart (order not valid for inpatient use) -    Tobacco Social History   Tobacco Use    Smoking Status Former Smoker  . Packs/day: 3.00  . Types: Cigarettes  . Quit date: 06/09/1975  . Years since quitting: 44.0  Smokeless Tobacco Never Used     Counseling given: Not Answered   Clinical Intake:  Pre-visit preparation completed: No  Pain : No/denies pain Pain Score: 0-No pain        How often do you need to have someone help you when you read instructions, pamphlets, or other written materials from your doctor or pharmacy?: 3 - Sometimes What is the last grade level you completed in school?: 4 year degree  Interpreter Needed?: No  Information entered by :: Cindi Carbon NP  Past Medical History:  Diagnosis Date  . Adult failure to thrive    Per incoming records from [facility]  . Allergic rhinitis    Per incoming records from [facility]  . Atrial fibrillation ([place])   . BPH (benign prostatic hyperplasia)    Per incoming records from [facility]  . Cancer of kidney [facility])    Right, Per incoming records from [facility]  . Cataract    Per incoming records from [facility]  . Cerebrovascular accident, late effects    Per incoming records from [facility]  . Chronic kidney disease, stage III (moderate)    Per [facility] records from [facility]  . Claudication [facility])    Per incoming records from [facility]  . Cognitive impairment    Mild, Per incoming records from [facility]  . Constipation    Per incoming records from [facility]  . Dementia ([place])   . Diarrhea    Better off Aricept, Per incoming records from [facility]  . Diastolic dysfunction    on 2D Echo 07/2009 and 2016, Per incoming records from [facility]  . Diverticulosis    Mild, Per incoming records from [facility]  . Diverticulosis of colon (without mention of hemorrhage)   . ED  (erectile dysfunction)    Per incoming records from [facility]  . History of Coumadin therapy    Per incoming records from [facility]  . History of echocardiogram 09/2014   Per incoming records from [facility]  . Hyperlipemia   . Hypertension   . Kidney mass    Per incoming records from [facility]  . Lower leg edema    Per incoming records from [facility]  . Melanoma ([place])   . Neuropathy    Per incoming records from [facility]  . OA (osteoarthritis)    Per incoming records from [facility]  . Peripheral neuropathy    Per incoming records from [facility]  . Personal history of colonic polyps 1999 & 2004   adenomatous polyps  . Pulmonary arterial hypertension ([place])    Per incoming records from [facility]  . Rosacea    Per incoming records from [facility]  . Thrombocytopenia ([place]) 2017   Per incoming records from [facility]  . UTI (urinary tract infection)    Sepsis, Per incoming records from [facility]  . Ventricular hypertrophy   . Walker as ambulation aid    Per incoming records from [employer] [facility]   Past Surgical History:  Procedure Laterality Date  . APPENDECTOMY     Per incoming records from [facility]  . CATARACT EXTRACTION     Per incoming records from [facility]  . KNEE SURGERY     right  . PILONIDAL CYST DRAINAGE    . POLYPECTOMY     Per incoming records from [facility]  . schwanoma tumor lumbar spine    . SPINE SURGERY     tumor removed  . THORACIC 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, Per incoming records from [facility]  . TONSILLECTOMY AND ADENOIDECTOMY     Family History  Problem Relation Age of Onset  . Stroke Father   . CVA Father   . Tuberculosis  Father   . Neuropathy Neg Hx    Social History   Socioeconomic History  . Marital status: Married    Spouse name: Not on file  . Number of children: 2  . Years of education: Not on file  . Highest education level: Bachelor's degree (e.g., BA, AB, BS)  Occupational History  . Occupation: retired  Tobacco Use  . Smoking status: Former Smoker    Packs/day: 3.00    Types: Cigarettes    Quit date: 06/09/1975    Years since quitting: 44.0  . Smokeless tobacco: Never Used  Substance and Sexual Activity  . Alcohol use: Yes    Alcohol/week: 0.0 standard drinks    Comment: 2 drinks per day  . Drug use: No  . Sexual activity: Not on file  Other Topics Concern  . Not on file  Social History Narrative   Patient is married with 2 children, 3 grandchildren  Patient is retired Engineer, maintenance (IT)   Patient lives at [place] Strain:   . Difficulty of Paying Living Expenses: Not on file  Food Insecurity:   . Worried About Charity fundraiser in the Last Year: Not on file  . Ran Out of Food in the Last Year: Not on file  Transportation Needs:   . Lack of Transportation (Medical): Not on file  . Lack of Transportation (Non-Medical): Not on file  Physical Activity:   . Days of Exercise per Week: Not on file  . Minutes of Exercise per Session: Not on file  Stress:   . Feeling of Stress : Not on file  Social Connections:   . Frequency of Communication with Friends and Family: Not on file  . Frequency of Social Gatherings with Friends and Family: Not on file  . Attends Religious Services: Not on file  . Active Member of Clubs or Organizations: Not on file  . Attends Archivist Meetings: Not on file  . Marital Status: Not on file    Outpatient Encounter Medications as of 06/23/2019  Medication Sig  . acetaminophen (TYLENOL) 500 MG tablet Take 500 mg by mouth 2 (two) times daily. Scheduled and every 4 hours as needed (ending  05/05/2019)Document area of discomfort and effectiveness of medication.  Marland Kitchen atorvastatin (LIPITOR) 20 MG tablet Take 20 mg by mouth every evening.   . bisacodyl (DULCOLAX) 10 MG suppository Place 10 mg rectally as needed for moderate constipation. X 2 days when needed  . cholecalciferol (VITAMIN D3) 25 MCG (1000 UT) tablet Take 2,000 Units by mouth daily.   . DULoxetine (CYMBALTA) 30 MG capsule Take 30 mg by mouth daily.  . hydrochlorothiazide (MICROZIDE) 12.5 MG capsule Take 12.5 mg by mouth every other day.  . methenamine (MANDELAMINE) 1 g tablet Take 1,000 mg by mouth 2 (two) times daily.  Marland Kitchen warfarin (COUMADIN) 1 MG tablet Take 3 mg by mouth daily at 6 PM.   . [DISCONTINUED] ciprofloxacin (CIPRO) 500 MG tablet Take 500 mg by mouth 2 (two) times daily.  . [DISCONTINUED] saccharomyces boulardii (FLORASTOR) 250 MG capsule Take 250 mg by mouth 2 (two) times daily.   No facility-administered encounter medications on file as of 06/23/2019.    Activities of Daily Living In your present state of health, do you have any difficulty performing the following activities: 06/23/2019  Hearing? N  Vision? N  Difficulty concentrating or making decisions? Y  Walking or climbing stairs? Y  Dressing or bathing? Y  Doing errands, shopping? Y  Preparing Food and eating ? Y  Using the Toilet? Y  In the past six months, have you accidently leaked urine? Y  Do you have problems with loss of bowel control? Y  Managing your Medications? Y  Managing your Finances? Y  Housekeeping or managing your Housekeeping? Y  Some recent data might be hidden    Patient Care Team: Gayland Curry, DO as PCP - General (Geriatric Medicine) [name], MD as PCP - Cardiology (Cardiology)   Assessment:   This is a routine wellness examination for [place].  Exercise Activities and Dietary recommendations Current Exercise Habits: The patient does not participate in regular exercise at present, Exercise limited by:  neurologic condition(s);orthopedic condition(s)  Goals    . DIET - EAT MORE FRUITS AND VEGETABLES       Fall Risk Fall Risk  06/23/2019 05/07/2019 05/03/2019  Falls in the past  year? 1 - -  Number falls in past yr: 1 - -  Injury with Fall? 1 - -  Risk for fall due to : History of fall(s);Impaired balance/gait;Impaired mobility History of fall(s);Impaired balance/gait;Impaired mobility;Mental status change;Medication side effect History of fall(s);Impaired balance/gait;Impaired mobility;Medication side effect;Mental status change  Follow up Falls evaluation completed Falls evaluation completed;Education provided;Falls prevention discussed Falls evaluation completed;Education provided;Falls prevention discussed  Comment - [facility] at [place]   Is the patient's home free of loose throw rugs in walkways, pet beds, electrical cords, etc?   yes      Grab bars in the bathroom? yes      Handrails on the stairs?   yes      Adequate lighting?   yes  Timed Get Up and Go Performed: not able to perform  Depression Screen PHQ 2/9 Scores 06/23/2019  PHQ - 2 Score 0    Cognitive Function MMSE - Mini Mental State Exam 06/23/2019 11/23/2017 11/05/2016  Orientation to time 1 2 5   Orientation to Place 4 5 5   Registration 3 3 3   Attention/ Calculation 5 5 5   Recall 0 2 1  Language- name 2 objects 2 2 2   Language- repeat 1 1 1   Language- follow 3 step command 3 3 3   Language- read & follow direction 1 1 1   Write a sentence 0 1 1  Copy design 0 1 1  Total score 20 26 28    Montreal Cognitive Assessment  07/26/2014  Visuospatial/ Executive (0/5) 3  Naming (0/3) 3  Attention: Read list of digits (0/2) 2  Attention: Read list of letters (0/1) 1  Attention: Serial 7 subtraction starting at 100 (0/3) 3  Language: Repeat phrase (0/2) 2  Language : Fluency (0/1) 1  Abstraction (0/2) 2  Delayed Recall (0/5) 0  Orientation (0/6) 5  Total 22  Adjusted Score (based on  education) 22      Immunization History  Administered Date(s) Administered  . Influenza-Unspecified 04/17/2014, 05/01/2016, 04/15/2019  . Pneumococcal Conjugate-13 04/20/2014  . Pneumococcal Polysaccharide-23 07/08/2003, 03/31/2011  . Pneumococcal-Unspecified 01/26/2004  . Td 08/23/2007  . Tdap 01/07/2012  . Zoster 07/17/2005  . Zoster Recombinat (Shingrix) 10/05/2017, 12/09/2017    Qualifies for Shingles Vaccine? Up to date  Screening Tests Health Maintenance  Topic Date Due  . TETANUS/TDAP  01/06/2022  . INFLUENZA VACCINE  Completed  . PNA vac Low Risk Adult  Completed   Cancer Screenings: Lung: Low Dose CT Chest recommended if Age 29-80 years, 30 pack-year currently smoking OR have quit w/in 15years. Patient does not qualify. Colorectal: aged out  Additional Screenings: aged out Hepatitis C Screening:      Plan:     I have personally reviewed and noted the following in the patient's chart:   . Medical and social history . Use of alcohol, tobacco or illicit drugs  . Current medications and supplements . Functional ability and status . Nutritional status . Physical activity . Advanced directives . List of other physicians . Hospitalizations, surgeries, and ER visits in previous 12 months . Vitals . Screenings to include cognitive, depression, and falls . Referrals and appointments  In addition, I have reviewed and discussed with patient certain preventive protocols, quality metrics, and best practice recommendations. A written personalized care plan for preventive services as well as general preventive health recommendations were provided to patient.     [name], NP  06/23/2019

## 2019-06-24 ENCOUNTER — Encounter: Payer: Self-pay | Admitting: Adult Health

## 2019-06-24 DIAGNOSIS — M25569 Pain in unspecified knee: Secondary | ICD-10-CM | POA: Insufficient documentation

## 2019-06-24 DIAGNOSIS — R601 Generalized edema: Secondary | ICD-10-CM | POA: Diagnosis not present

## 2019-06-24 DIAGNOSIS — R41841 Cognitive communication deficit: Secondary | ICD-10-CM | POA: Diagnosis not present

## 2019-06-24 DIAGNOSIS — R278 Other lack of coordination: Secondary | ICD-10-CM | POA: Diagnosis not present

## 2019-06-24 DIAGNOSIS — F0150 Vascular dementia without behavioral disturbance: Secondary | ICD-10-CM | POA: Diagnosis not present

## 2019-06-24 DIAGNOSIS — F0280 Dementia in other diseases classified elsewhere without behavioral disturbance: Secondary | ICD-10-CM | POA: Diagnosis not present

## 2019-06-24 DIAGNOSIS — M6389 Disorders of muscle in diseases classified elsewhere, multiple sites: Secondary | ICD-10-CM | POA: Diagnosis not present

## 2019-06-27 DIAGNOSIS — R41841 Cognitive communication deficit: Secondary | ICD-10-CM | POA: Diagnosis not present

## 2019-06-27 DIAGNOSIS — F0280 Dementia in other diseases classified elsewhere without behavioral disturbance: Secondary | ICD-10-CM | POA: Diagnosis not present

## 2019-06-27 DIAGNOSIS — R278 Other lack of coordination: Secondary | ICD-10-CM | POA: Diagnosis not present

## 2019-06-27 DIAGNOSIS — R601 Generalized edema: Secondary | ICD-10-CM | POA: Diagnosis not present

## 2019-06-27 DIAGNOSIS — F0150 Vascular dementia without behavioral disturbance: Secondary | ICD-10-CM | POA: Diagnosis not present

## 2019-06-27 DIAGNOSIS — M6389 Disorders of muscle in diseases classified elsewhere, multiple sites: Secondary | ICD-10-CM | POA: Diagnosis not present

## 2019-06-28 LAB — PROTIME-INR: Protime: 17.1 — AB (ref 10.0–13.8)

## 2019-06-28 LAB — POCT INR: INR: 1.4 — AB (ref 0.9–1.1)

## 2019-07-10 DIAGNOSIS — Z9189 Other specified personal risk factors, not elsewhere classified: Secondary | ICD-10-CM | POA: Diagnosis not present

## 2019-07-10 DIAGNOSIS — Z20828 Contact with and (suspected) exposure to other viral communicable diseases: Secondary | ICD-10-CM | POA: Diagnosis not present

## 2019-07-12 DIAGNOSIS — Z23 Encounter for immunization: Secondary | ICD-10-CM | POA: Diagnosis not present

## 2019-07-14 DIAGNOSIS — I482 Chronic atrial fibrillation, unspecified: Secondary | ICD-10-CM | POA: Diagnosis not present

## 2019-07-18 DIAGNOSIS — Z20828 Contact with and (suspected) exposure to other viral communicable diseases: Secondary | ICD-10-CM | POA: Diagnosis not present

## 2019-07-18 DIAGNOSIS — Z9189 Other specified personal risk factors, not elsewhere classified: Secondary | ICD-10-CM | POA: Diagnosis not present

## 2019-07-18 LAB — NOVEL CORONAVIRUS, NAA: SARS-CoV-2, NAA: POSITIVE

## 2019-07-21 ENCOUNTER — Telehealth: Payer: Self-pay | Admitting: Adult Health

## 2019-07-21 DIAGNOSIS — I4891 Unspecified atrial fibrillation: Secondary | ICD-10-CM | POA: Diagnosis not present

## 2019-07-21 DIAGNOSIS — Z7901 Long term (current) use of anticoagulants: Secondary | ICD-10-CM

## 2019-07-22 NOTE — Telephone Encounter (Signed)
The nurse called and reported an INR of 1.7 .  He was 1.6 on 07/14/19.  He is currently taking 4 mg of coumadin qd. Adjustments made see MAR and recheck INR in 1 week.

## 2019-07-26 ENCOUNTER — Non-Acute Institutional Stay (SKILLED_NURSING_FACILITY): Payer: Medicare Other | Admitting: Internal Medicine

## 2019-07-26 ENCOUNTER — Encounter: Payer: Self-pay | Admitting: Internal Medicine

## 2019-07-26 DIAGNOSIS — F0150 Vascular dementia without behavioral disturbance: Secondary | ICD-10-CM | POA: Diagnosis not present

## 2019-07-26 DIAGNOSIS — R310 Gross hematuria: Secondary | ICD-10-CM | POA: Diagnosis not present

## 2019-07-26 DIAGNOSIS — D696 Thrombocytopenia, unspecified: Secondary | ICD-10-CM | POA: Diagnosis not present

## 2019-07-26 DIAGNOSIS — Z66 Do not resuscitate: Secondary | ICD-10-CM | POA: Diagnosis not present

## 2019-07-26 DIAGNOSIS — R338 Other retention of urine: Secondary | ICD-10-CM | POA: Diagnosis not present

## 2019-07-26 DIAGNOSIS — N401 Enlarged prostate with lower urinary tract symptoms: Secondary | ICD-10-CM | POA: Diagnosis not present

## 2019-07-26 DIAGNOSIS — U071 COVID-19: Secondary | ICD-10-CM

## 2019-07-26 DIAGNOSIS — N2889 Other specified disorders of kidney and ureter: Secondary | ICD-10-CM

## 2019-07-27 DIAGNOSIS — D6949 Other primary thrombocytopenia: Secondary | ICD-10-CM | POA: Diagnosis not present

## 2019-07-27 DIAGNOSIS — R3914 Feeling of incomplete bladder emptying: Secondary | ICD-10-CM | POA: Diagnosis not present

## 2019-07-27 DIAGNOSIS — R319 Hematuria, unspecified: Secondary | ICD-10-CM | POA: Diagnosis not present

## 2019-07-27 DIAGNOSIS — Z79899 Other long term (current) drug therapy: Secondary | ICD-10-CM | POA: Diagnosis not present

## 2019-07-27 DIAGNOSIS — N390 Urinary tract infection, site not specified: Secondary | ICD-10-CM | POA: Diagnosis not present

## 2019-07-27 DIAGNOSIS — D649 Anemia, unspecified: Secondary | ICD-10-CM | POA: Diagnosis not present

## 2019-07-27 DIAGNOSIS — I10 Essential (primary) hypertension: Secondary | ICD-10-CM | POA: Diagnosis not present

## 2019-07-27 DIAGNOSIS — E785 Hyperlipidemia, unspecified: Secondary | ICD-10-CM | POA: Diagnosis not present

## 2019-07-27 NOTE — Progress Notes (Signed)
Location:   [facility] Room Number: 156A Place of Service:  SNF (31) Provider:  Fumiye Lubben L. Mariea Clonts, D.O., C.M.D.  Gayland Curry, DO  Patient Care Team: Gayland Curry, DO as PCP - General (Geriatric Medicine) [name], MD as PCP - Cardiology (Cardiology)  Extended Emergency Contact Information Primary Emergency Contact: Dolbow,[name] Address: [address]          [place], [place] [address] [name] of [place] Phone: [phone] Mobile Phone: [phone] Relation: Spouse Secondary Emergency Contact: Meeker,Anup Address: [place] [address] [name] of [place] Phone: [phone] Mobile Phone: [phone] Relation: Son  Code Status:  DNR Goals of care: Advanced Directive information Advanced Directives 07/26/2019  Does Patient Have a Medical Advance Directive? Yes  Type of Paramedic of [place];Out of facility DNR (pink MOST or yellow form)  Does patient want to make changes to medical advance directive? No - Patient declined  Copy of [place] in Chart? Yes - validated most recent copy scanned in chart (See row information)  Pre-existing out of facility DNR order (yellow form or pink MOST form) -     Chief Complaint  Patient presents with  . Medical Management of Chronic Issues     Routine visit     HPI:  Pt is a 86 y.o. 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, right renal mass for which intervention has been declined due to his comorbidities and advanced age, chronic anticoagulation with coumadin due to the afib  seen today for medical management of chronic diseases.  Unfortunately, he was transferred to the covid unit due to positive test.  He was doing ok outside of increased confusion and some hematuria noted in his catheter bag  that was relayed to me as having cleared up--when I looked at the notes, it was actually recorded as still amber or orange.  He was having urinary frequency.  sats and vitals normal.  No cough or sob.  No hypoxia.  No fever.  Pt has catheter so maybe having bladder spasms causing "frequency" sensation.  Nurse changed catheter so okd to send urine due to its appearance and his delirium (though it could be related to his change in location and covid diagnosis).  Past Medical History:  Diagnosis Date  . Adult failure to thrive    Per incoming records from [facility]  . Allergic rhinitis    Per incoming records from [facility]  . Atrial fibrillation ([place])   . BPH (benign prostatic hyperplasia)    Per incoming records from [facility]  . Cancer of kidney [facility])    Right, Per incoming records from [facility]  . Cataract    Per incoming records from [facility]  . Cerebrovascular accident, late effects    Per incoming records from [facility]  . Chronic kidney disease, stage III (moderate)    Per incoming records from [facility]  . Claudication [facility])    Per incoming records from [facility]  . Cognitive impairment    Mild, Per incoming records from [facility]  . Constipation    Per incoming records from [facility]  . Dementia ([place])   . Diarrhea    Better off Aricept, Per incoming records from [facility]  . Diastolic dysfunction    on 2D Echo 07/2009 and 2016, Per incoming records from [facility]  . Diverticulosis    Mild, Per incoming records from [facility]  . Diverticulosis of colon (without mention of hemorrhage)   . ED (erectile dysfunction)    Per incoming records from [facility]  . History of Coumadin therapy    Per incoming records from [facility]  . History of echocardiogram 09/2014   Per incoming records from [facility]  . Hyperlipemia   . Hypertension   . Kidney mass    Per incoming records from [facility]  . Lower leg edema    Per incoming records from [facility]  . Melanoma ([place])   . Neuropathy    Per incoming records from [facility]  . OA (osteoarthritis)    Per incoming records from [facility]  . Peripheral neuropathy    Per incoming records from [facility]  . Personal history of colonic polyps 1999 & 2004   adenomatous polyps  . Pulmonary arterial hypertension ([place])    Per incoming records from [facility]  . Rosacea    Per incoming records from [facility]  . Thrombocytopenia ([place]) 2017   Per incoming records from [facility]  . UTI (urinary tract infection)    Sepsis, Per incoming records from [facility]  . Ventricular hypertrophy   . Walker as ambulation aid    Per incoming records from [employer] [facility]   Past Surgical History:  Procedure Laterality Date  . APPENDECTOMY     Per incoming records from [facility]  . CATARACT EXTRACTION     Per incoming records from [facility]  . KNEE SURGERY     right  . PILONIDAL CYST DRAINAGE    . POLYPECTOMY     Per incoming records from [facility]  . schwanoma tumor lumbar spine    . SPINE SURGERY     tumor removed  . THORACIC 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, Per incoming records from [facility]  . TONSILLECTOMY AND ADENOIDECTOMY      Allergies  Allergen Reactions  . Penicillins Rash    Has patient had a PCN reaction causing immediate rash, facial/tongue/throat swelling, SOB or lightheadedness with hypotension: unknown Has patient had a PCN reaction causing severe rash involving  mucus membranes or skin necrosis: unknown Has patient had a PCN reaction that required hospitalization : unknown Has patient had a PCN reaction occurring within the last 10 years: no If all of the above answers are "NO", then may proceed with Cephalosporin use.     Outpatient Encounter Medications as of 07/26/2019  Medication Sig  . acetaminophen (TYLENOL) 500 MG tablet Take 500 mg by mouth 2 (two) times daily. Scheduled and every 4 hours as needed (ending 05/05/2019)Document area of discomfort and effectiveness of medication.  Marland Kitchen atorvastatin (LIPITOR) 20 MG tablet Take 20 mg by mouth every evening.   . bisacodyl (DULCOLAX) 10 MG suppository Place 10 mg rectally as needed for moderate constipation. X 2 days when needed  . cholecalciferol (VITAMIN D3) 25 MCG (1000 UT) tablet Take 2,000 Units by mouth daily.   . DULoxetine (CYMBALTA) 30 MG capsule Take 30 mg by mouth daily.  . hydrochlorothiazide (MICROZIDE) 12.5 MG capsule Take 12.5 mg by mouth every other day.  . methenamine (MANDELAMINE)  1 g tablet Take 1,000 mg by mouth 2 (two) times daily.  Marland Kitchen warfarin (COUMADIN) 4 MG tablet Take 4 mg by mouth every other day. Give 4 mg M, W, F, Sunday  . warfarin (COUMADIN) 5 MG tablet Take 5 mg by mouth every other day. Give 5 mg Tuesday, Thursday, and Saturday  . [DISCONTINUED] methenamine (HIPREX) 1 g tablet    No facility-administered encounter medications on file as of 07/26/2019.    Review of Systems  Constitutional: Negative for chills, fever and malaise/fatigue.  HENT: Negative for congestion and sore throat.   Eyes: Negative for blurred vision.  Respiratory: Negative for cough and shortness of breath.   Cardiovascular: Positive for leg swelling. Negative for chest pain and palpitations.  Gastrointestinal: Negative for abdominal pain, blood in stool, constipation, diarrhea and melena.  Genitourinary: Positive for frequency and hematuria. Negative for dysuria, flank pain and urgency.        Catheter in place; has orange urine (has renal mass)  Musculoskeletal: Negative for falls.  Skin: Negative for rash.  Neurological: Negative for dizziness and loss of consciousness.  Psychiatric/Behavioral: Positive for memory loss. Negative for depression. The patient is not nervous/anxious and does not have insomnia.     Immunization History  Administered Date(s) Administered  . Influenza-Unspecified 04/17/2014, 05/01/2016, 04/15/2019  . Moderna SARS-COVID-2 Vaccination 07/12/2019  . Pneumococcal Conjugate-13 04/20/2014  . Pneumococcal Polysaccharide-23 07/08/2003, 03/31/2011  . Pneumococcal-Unspecified 01/26/2004  . Td 08/23/2007  . Tdap 01/07/2012  . Zoster 07/17/2005  . Zoster Recombinat (Shingrix) 10/05/2017, 12/09/2017   Pertinent  Health Maintenance Due  Topic Date Due  . INFLUENZA VACCINE  Completed  . PNA vac Low Risk Adult  Completed   Fall Risk  06/23/2019 05/07/2019 05/03/2019  Falls in the past year? 1 - -  Number falls in past yr: 1 - -  Injury with Fall? 1 - -  Risk for fall due to : History of fall(s);Impaired balance/gait;Impaired mobility History of fall(s);Impaired balance/gait;Impaired mobility;Mental status change;Medication side effect History of fall(s);Impaired balance/gait;Impaired mobility;Medication side effect;Mental status change  Follow up Falls evaluation completed Falls evaluation completed;Education provided;Falls prevention discussed Falls evaluation completed;Education provided;Falls prevention discussed  Comment - [facility] at [facility] all addressed at [place]:    Vitals:   07/26/19 1103  BP: 113/78  Pulse: 67  Temp: (!) 97.3 F (36.3 C)  SpO2: 96%  Weight: 262 lb 6.4 oz (119 kg)  Height: 6\' 7"  (2.007 m)   Body mass index is 29.56 kg/m. Physical Exam Vitals reviewed.  Constitutional:      General: He is not in acute distress.    Appearance: He is not ill-appearing or toxic-appearing.  HENT:      Head: Normocephalic and atraumatic.  Cardiovascular:     Rate and Rhythm: Rhythm irregular.     Heart sounds: No murmur.  Pulmonary:     Effort: Pulmonary effort is normal.     Breath sounds: Normal breath sounds. No rales.  Abdominal:     General: Bowel sounds are normal.     Palpations: Abdomen is soft.  Musculoskeletal:        General: Normal range of motion.     Right lower leg: Edema present.     Left lower leg: Edema present.  Skin:    General: Skin is warm and dry.  Neurological:     Mental Status: He is alert.     Cranial Nerves: No cranial nerve deficit.     Motor:  Weakness present.     Gait: Gait abnormal.     Comments: More confused than baseline in new room  Psychiatric:        Mood and Affect: Mood normal.     Labs reviewed: Recent Labs    03/22/19 0808 03/22/19 0808 05/01/19 1037 05/19/19 0000 06/02/19 0132  NA 140   < > 140 142 138  K 4.2   < > 4.4 4.1 3.8  CL 104   < > 103 107 104  CO2 28   < > 25 26* 23  GLUCOSE 206*  --  178*  --  125*  BUN 17   < > 27* 27* 20  CREATININE 1.30*  --  1.44* 1.2 1.32*  CALCIUM 10.3  --  10.3  --  10.1   < > = values in this interval not displayed.   Recent Labs    03/22/19 0808 05/01/19 1037 05/19/19 0000  AST 17 26 19   ALT 15 21 19   ALKPHOS 73 65 77  BILITOT 1.2 1.5*  --   PROT 6.4* 6.8  --   ALBUMIN 4.1 4.0  --    Recent Labs    03/22/19 0808 03/22/19 0808 05/01/19 1037 05/19/19 0000 06/02/19 0132  WBC 2.3*  --  3.5* 3.7 4.1  NEUTROABS 1.1*  --  2.4  --  3.0  HGB 16.0  --  15.5 17.5 15.0  HCT 48.2   < > 47.4 52 46.2  MCV 95.4  --  95.4  --  93.9  PLT 82*  --  PLATELET CLUMPS NOTED ON SMEAR, COUNT APPEARS DECREASED 139* 78*   < > = values in this interval not displayed.   Lab Results  Component Value Date   TSH 0.84 05/19/2019   Lab Results  Component Value Date   HGBA1C 6.0 (H) 07/19/2014   Lab Results  Component Value Date   CHOL 123 05/19/2019   HDL 30 (A) 05/19/2019   LDLCALC 62  05/19/2019   TRIG 154 05/19/2019    Assessment/Plan 1. Lab test positive for detection of COVID-19 virus -so far, asymptomatic outside of delirium which is multifactorial  2. Vascular dementia without behavioral disturbance (HCC) -advanced, requiring snf care  3. Gross hematuria -may be due to his coumadin, low platelets and renal mass but there is concern for uti due to his frequency sensation and appearance of urine so culture was sent  4. Renal mass, right -pt and his wife opted not to further investigate of treat this due to comorbidities -could be causing recurrent hematuria he's been getting  And likely colonized with bacteria due to chronic indwelling foley  5. Urinary retention due to benign prostatic hyperplasia -continues with chronic foley  6. Thrombocytopenia ([place]) -much lower than baseline, on coumadin so high risk of bleeding with this level--monitor  7. DNR (do not resuscitate) - Do not attempt resuscitation (DNR)  Family/ staff Communication: discussed with nurse managers  Labs/tests ordered:  ua c+s at nursing request   [place]. Keagan Brislin, D.O. [facility] [address], [place] [address] Cell Phone (Mon-Fri 8am-5pm):  [phone] On Call:  [phone] & follow prompts after 5pm & weekends Office Phone:  [phone] Office Fax:  [phone]

## 2019-07-28 ENCOUNTER — Encounter: Payer: Self-pay | Admitting: Adult Health

## 2019-07-28 ENCOUNTER — Non-Acute Institutional Stay (SKILLED_NURSING_FACILITY): Payer: Medicare Other | Admitting: Adult Health

## 2019-07-28 DIAGNOSIS — U071 COVID-19: Secondary | ICD-10-CM | POA: Diagnosis not present

## 2019-07-28 DIAGNOSIS — Z7901 Long term (current) use of anticoagulants: Secondary | ICD-10-CM | POA: Diagnosis not present

## 2019-07-28 DIAGNOSIS — T83511D Infection and inflammatory reaction due to indwelling urethral catheter, subsequent encounter: Secondary | ICD-10-CM

## 2019-07-28 DIAGNOSIS — I482 Chronic atrial fibrillation, unspecified: Secondary | ICD-10-CM | POA: Diagnosis not present

## 2019-07-28 DIAGNOSIS — D696 Thrombocytopenia, unspecified: Secondary | ICD-10-CM

## 2019-07-28 DIAGNOSIS — N390 Urinary tract infection, site not specified: Secondary | ICD-10-CM

## 2019-07-28 DIAGNOSIS — D709 Neutropenia, unspecified: Secondary | ICD-10-CM | POA: Diagnosis not present

## 2019-07-28 LAB — BASIC METABOLIC PANEL
BUN: 20 (ref 4–21)
CO2: 27 — AB (ref 13–22)
Chloride: 101 (ref 99–108)
Creatinine: 1.2 (ref 0.6–1.3)
Glucose: 104
Potassium: 4 (ref 3.4–5.3)
Sodium: 137 (ref 137–147)

## 2019-07-28 LAB — PROTIME-INR: Protime: 26 — AB (ref 10.0–13.8)

## 2019-07-28 LAB — CBC AND DIFFERENTIAL
HCT: 42 (ref 41–53)
Hemoglobin: 13.9 (ref 13.5–17.5)
Neutrophils Absolute: 2
Platelets: 85 — AB (ref 150–399)
WBC: 2.7

## 2019-07-28 LAB — COMPREHENSIVE METABOLIC PANEL: Calcium: 10.2 (ref 8.7–10.7)

## 2019-07-28 LAB — POCT INR: INR: 2.4 — AB (ref 0.9–1.1)

## 2019-07-28 LAB — CBC: RBC: 4.62 (ref 3.87–5.11)

## 2019-07-28 NOTE — Progress Notes (Addendum)
Location:  Occupational psychologist of Service:  SNF (31) Provider:   Cindi Carbon, ANP [place] [phone]   Gayland Curry, DO  Patient Care Team: Gayland Curry, DO as PCP - General (Geriatric Medicine) [name], MD as PCP - Cardiology (Cardiology)  Extended Emergency Contact Information Primary Emergency Contact: Swango,[name] Address: [address]          [place], [place] [address] [name] of [place] Phone: [phone] Mobile Phone: [phone] Relation: Spouse Secondary Emergency Contact: Yanes,Deny Address: [place] [address] [name] of [place] Phone: [phone] Mobile Phone: [phone] Relation: Son  Code Status:  DNR Goals of care: Advanced Directive information Advanced Directives 07/26/2019  Does Patient Have a Medical Advance Directive? Yes  Type of Paramedic of [place];Out of facility DNR (pink MOST or yellow form)  Does patient want to make changes to medical advance directive? No - Patient declined  Copy of [place] in Chart? Yes - validated most recent copy scanned in chart (See row information)  Pre-existing out of facility DNR order (yellow form or pink MOST form) -     Chief Complaint  Patient presents with  . Acute Visit    covid pos, difficulty obtaining 02 sat    HPI:  Pt is a 86 y.o. male seen today for an acute visit for difficulty obtaining an 02 sat and hx of covid positive. He tested positive for covid on 1/11. He has been in isolation at [facility] on the covid unit. The nurse reports that he has not had any sob, cough, fever, sore throat, diarrhea, nausea, loss of taste or smell. He does have low appetite and has lost 7 lbs since Jan but if you review the weight from Dec he may not have lost weight. Most intakes are 50-75 or 76-100 %. Urine output is adequate.  See below.  Wt Readings  from Last 3 Encounters:  07/28/19 255 lb (115.7 kg)  07/26/19 262 lb 6.4 oz (119 kg)  06/23/19 246 lb 3.2 oz (111.7 kg)   The nurse is having trouble obtaining an 02 sat because his fingers are cold. We obtained a facial sensor and the reading was 92%.  He remains in no distress and is not cyanotic. BP ok.   He is on coumadin for a hx of afib. INR was 2.4 1/21  He is on keflex for UTI. Symptoms were frequency and orange color urine. He has a catheter due bph.  UA showed: UA C and S leuk est 500, Nitrite 2+ protein 2+, glucose 1+, 3+ blood, RBC TNC, WBC TNC, Bacteria 4+ >100,000 colonies gram neg rods, 90,000 colonies gram pos cocci  He does not have any urinary symptoms at this time. No back pain or fever. Urine is clear yellow.   He has a hx of thrombocytopenia/neutropenia and is followed by [name]. No bone bx performed. Differential was ITP vs myelodysplasia. He also has a hx of renal mass on the right , followed by urology.  Lab Results  Component Value Date   PLT 85 (A) 07/28/2019   Lab Results  Component Value Date   WBC 2.7 07/28/2019     Past Medical History:  Diagnosis Date  . Adult failure to thrive    Per incoming records from [facility]  . Allergic rhinitis    Per incoming  records from [facility]  . Atrial fibrillation ([place])   . BPH (benign prostatic hyperplasia)    Per incoming records from [facility]  . Cancer of kidney [facility])    Right, Per incoming records from [facility]  . Cataract    Per incoming records from [facility]  . Cerebrovascular accident, late effects    Per incoming records from [facility]  . Chronic kidney disease, stage III (moderate)    Per incoming records from [facility]  . Claudication [facility])    Per incoming records from [facility]  . Cognitive impairment    Mild, Per incoming records from [facility]  . Constipation    Per incoming records from [facility]  . Dementia ([place])   . Diarrhea    Better off Aricept, Per incoming records from [facility]  . Diastolic dysfunction    on 2D Echo 07/2009 and 2016, Per incoming records from [facility]  . Diverticulosis    Mild, Per incoming records from [facility]  . Diverticulosis of colon (without mention of hemorrhage)   . ED (erectile dysfunction)    Per incoming records from [facility]  . History of Coumadin therapy    Per incoming records from [facility]  . History of echocardiogram 09/2014   Per incoming records from [facility]  . Hyperlipemia   . Hypertension   . Kidney mass    Per incoming records from [facility]  . Lower leg edema    Per incoming records from [facility]  . Melanoma ([place])   . Neuropathy    Per incoming records from [facility]  . OA (osteoarthritis)    Per incoming records from [facility]  . Peripheral neuropathy    Per incoming records from [facility]  . Personal history of colonic polyps 1999 & 2004   adenomatous polyps  . Pulmonary arterial hypertension ([place])    Per incoming records from [facility]  . Rosacea    Per incoming records from [facility]  . Thrombocytopenia ([place]) 2017   Per incoming records from [facility]  . UTI (urinary tract infection)    Sepsis, Per incoming records from [facility]  . Ventricular hypertrophy   . Walker as ambulation aid    Per incoming records from [employer]   Past Surgical History:  Procedure Laterality Date  . APPENDECTOMY     Per incoming records from [facility]  . CATARACT EXTRACTION     Per incoming records from [facility]  .  KNEE SURGERY     right  . PILONIDAL CYST DRAINAGE    . POLYPECTOMY     Per incoming records from [facility]  . schwanoma tumor lumbar spine    . SPINE SURGERY     tumor removed  . THORACIC 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, Per incoming records from [facility]  . TONSILLECTOMY AND ADENOIDECTOMY      Allergies  Allergen Reactions  . Penicillins Rash    Has patient had a PCN reaction causing immediate rash, facial/tongue/throat swelling, SOB or lightheadedness with hypotension: unknown Has patient had a PCN reaction causing severe rash involving mucus membranes or skin necrosis: unknown Has patient had a PCN reaction that required hospitalization : unknown Has patient had a PCN  reaction occurring within the last 10 years: no If all of the above answers are "NO", then may proceed with Cephalosporin use.     Outpatient Encounter Medications as of 07/28/2019  Medication Sig  . cephALEXin (KEFLEX) 500 MG capsule Take 500 mg by mouth 2 (two) times daily.  Marland Kitchen acetaminophen (TYLENOL) 500 MG tablet Take 500 mg by mouth 2 (two) times daily. Scheduled and every 4 hours as needed (ending 05/05/2019)Document area of discomfort and effectiveness of medication.  Marland Kitchen atorvastatin (LIPITOR) 20 MG tablet Take 20 mg by mouth every evening.   . bisacodyl (DULCOLAX) 10 MG suppository Place 10 mg rectally as needed for moderate constipation. X 2 days when needed  . cholecalciferol (VITAMIN D3) 25 MCG (1000 UT) tablet Take 2,000 Units by mouth daily.   . DULoxetine (CYMBALTA) 30 MG capsule Take 30 mg by mouth daily.  . hydrochlorothiazide (MICROZIDE) 12.5 MG capsule Take 12.5 mg by mouth every other day.  . methenamine (MANDELAMINE) 1 g tablet Take 1,000 mg by mouth 2 (two) times daily.  Marland Kitchen warfarin (COUMADIN) 4 MG tablet Take 4 mg by mouth every other day. Give 4 mg M, W, F, Sunday  . warfarin (COUMADIN) 5 MG tablet Take 5 mg by mouth every other  day. Give 5 mg Tuesday, Thursday, and Saturday   No facility-administered encounter medications on file as of 07/28/2019.    Review of Systems  Constitutional: Positive for appetite change and unexpected weight change. Negative for activity change, chills, diaphoresis, fatigue and fever.  Respiratory: Negative for cough, shortness of breath, wheezing and stridor.   Cardiovascular: Positive for leg swelling. Negative for chest pain and palpitations.  Gastrointestinal: Negative for abdominal distention, abdominal pain, constipation and diarrhea.  Genitourinary: Negative for difficulty urinating, dysuria, flank pain, frequency, hematuria, penile swelling and scrotal swelling.  Musculoskeletal: Positive for gait problem. Negative for arthralgias, back pain, joint swelling and myalgias.  Neurological: Negative for dizziness, seizures, syncope, facial asymmetry, speech difficulty, weakness and headaches.  Hematological: Negative for adenopathy. Does not bruise/bleed easily.  Psychiatric/Behavioral: Positive for confusion. Negative for agitation and behavioral problems.    Immunization History  Administered Date(s) Administered  . Influenza-Unspecified 04/17/2014, 05/01/2016, 04/15/2019  . Moderna SARS-COVID-2 Vaccination 07/12/2019  . Pneumococcal Conjugate-13 04/20/2014  . Pneumococcal Polysaccharide-23 07/08/2003, 03/31/2011  . Pneumococcal-Unspecified 01/26/2004  . Td 08/23/2007  . Tdap 01/07/2012  . Zoster 07/17/2005  . Zoster Recombinat (Shingrix) 10/05/2017, 12/09/2017   Pertinent  Health Maintenance Due  Topic Date Due  . INFLUENZA VACCINE  Completed  . PNA vac Low Risk Adult  Completed   Fall Risk  06/23/2019 05/07/2019 05/03/2019  Falls in the past year? 1 - -  Number falls in past yr: 1 - -  Injury with Fall? 1 - -  Risk for fall due to : History of fall(s);Impaired balance/gait;Impaired mobility History of fall(s);Impaired balance/gait;Impaired mobility;Mental status  change;Medication side effect History of fall(s);Impaired balance/gait;Impaired mobility;Medication side effect;Mental status change  Follow up Falls evaluation completed Falls evaluation completed;Education provided;Falls prevention discussed Falls evaluation completed;Education provided;Falls prevention discussed  Comment - done at [facility] all addressed at [place]:    Vitals:   07/28/19 1652  BP: 134/80  Pulse: 96  Resp: 18  Temp: (!) 97 F (36.1 C)  SpO2: 92%  Weight: 255 lb (115.7 kg)   Body mass index is 28.73 kg/m. Physical Exam Vitals and nursing note reviewed.  Constitutional:      General: He is not in acute  distress.    Appearance: He is not diaphoretic.  HENT:     Head: Normocephalic and atraumatic.     Nose: Nose normal. No congestion.     Mouth/Throat:     Mouth: Mucous membranes are moist.     Pharynx: Oropharynx is clear. No oropharyngeal exudate.  Neck:     Thyroid: No thyromegaly.     Vascular: No JVD.     Trachea: No tracheal deviation.  Cardiovascular:     Rate and Rhythm: Normal rate. Rhythm irregular.     Heart sounds: No murmur.  Pulmonary:     Effort: Pulmonary effort is normal. No respiratory distress.     Breath sounds: Normal breath sounds. No wheezing.  Abdominal:     General: Bowel sounds are normal. There is no distension.     Palpations: Abdomen is soft.     Tenderness: There is no abdominal tenderness.  Musculoskeletal:     Right lower leg: Edema present.     Left lower leg: Edema present.  Lymphadenopathy:     Cervical: No cervical adenopathy.  Skin:    General: Skin is warm and dry.     Comments: No cyanosis  Neurological:     General: No focal deficit present.     Mental Status: He is alert. Mental status is at baseline.     Cranial Nerves: No cranial nerve deficit.  Psychiatric:        Mood and Affect: Mood normal.     Labs reviewed: Recent Labs    03/22/19 0808 03/22/19 0808  05/01/19 1037 05/01/19 1037 05/19/19 0000 06/02/19 0132 07/28/19 0000  NA 140   < > 140  --  142 138 137  K 4.2   < > 4.4   < > 4.1 3.8 4.0  CL 104   < > 103   < > 107 104 101  CO2 28   < > 25   < > 26* 23 27*  GLUCOSE 206*  --  178*  --   --  125*  --   BUN 17   < > 27*  --  27* 20 20  CREATININE 1.30*  --  1.44*  --  1.2 1.32* 1.2  CALCIUM 10.3   < > 10.3  --   --  10.1 10.2   < > = values in this interval not displayed.   Recent Labs    03/22/19 0808 05/01/19 1037 05/19/19 0000  AST 17 26 19   ALT 15 21 19   ALKPHOS 73 65 77  BILITOT 1.2 1.5*  --   PROT 6.4* 6.8  --   ALBUMIN 4.1 4.0  --    Recent Labs    03/22/19 0808 03/22/19 0808 05/01/19 1037 05/01/19 1037 05/19/19 0000 06/02/19 0132 07/28/19 0000  WBC 2.3*  --  3.5*  --  3.7 4.1 2.7  NEUTROABS 1.1*   < > 2.4  --   --  3.0 2  HGB 16.0  --  15.5   < > 17.5 15.0 13.9  HCT 48.2   < > 47.4   < > 52 46.2 42  MCV 95.4  --  95.4  --   --  93.9  --   PLT 82*  --  PLATELET CLUMPS NOTED ON SMEAR, COUNT APPEARS DECREASED   < > 139* 78* 85*   < > = values in this interval not displayed.   Lab Results  Component Value Date   TSH 0.84 05/19/2019  Lab Results  Component Value Date   HGBA1C 6.0 (H) 07/19/2014   Lab Results  Component Value Date   CHOL 123 05/19/2019   HDL 30 (A) 05/19/2019   LDLCALC 62 05/19/2019   TRIG 154 05/19/2019    Significant Diagnostic Results in last 30 days:  No results found.  Assessment/Plan 1. Urinary tract infection associated with indwelling urethral catheter, subsequent encounter Seems to be improved Continue Keflex 500 mg bid to complete 7 day course and await final culture report.  2. COVID-19 virus infection We were able to obtain an 02 sat with the nose sensor at 92% on room air. He is hemodynamically stable and in no resp distress. He appears to have mild symptoms with decreased appetite and occasional body aches per the nurse. Will maintain isolation for at least 14  days and continue to monitor. Labs at this time do not indicate the need for IV hydration. He should continue to drink fluids regularly   3. Thrombocytopenia (HCC) Unchanged from previous numbers. No excessive bleeding or bruising. Followed by [name].   4. Neutropenia, unspecified type ([place]) WBC slightly lower than previous check. 4.1 to now 2.7. However, in review of epic records he was as low as 2.3 with ab neut count 1.1. Would continue to monitor at this time.   5. Long term use of anticoagulation Continue current dose of coumadin for CVA risk reduction  Recheck INR Monday due to antibiotic use.     Family/ staff Communication: discussed with the resident and his nurse.   Labs/tests ordered:  INR

## 2019-07-29 NOTE — Addendum Note (Signed)
Addended by: Barnie Mort on: 07/29/2019 11:36 AM   Modules accepted: Level of Service

## 2019-08-01 DIAGNOSIS — I4891 Unspecified atrial fibrillation: Secondary | ICD-10-CM | POA: Diagnosis not present

## 2019-08-01 DIAGNOSIS — Z7901 Long term (current) use of anticoagulants: Secondary | ICD-10-CM | POA: Diagnosis not present

## 2019-08-01 LAB — PROTIME-INR: Protime: 28.1 — AB (ref 10.0–13.8)

## 2019-08-01 LAB — POCT INR: INR: 2.6 — AB (ref 0.9–1.1)

## 2019-08-05 ENCOUNTER — Ambulatory Visit: Payer: Medicare Other | Admitting: Interventional Cardiology

## 2019-08-08 ENCOUNTER — Non-Acute Institutional Stay (SKILLED_NURSING_FACILITY): Payer: Medicare Other | Admitting: Adult Health

## 2019-08-08 ENCOUNTER — Encounter: Payer: Self-pay | Admitting: Adult Health

## 2019-08-08 DIAGNOSIS — I4811 Longstanding persistent atrial fibrillation: Secondary | ICD-10-CM | POA: Diagnosis not present

## 2019-08-08 DIAGNOSIS — Z7901 Long term (current) use of anticoagulants: Secondary | ICD-10-CM

## 2019-08-08 DIAGNOSIS — I482 Chronic atrial fibrillation, unspecified: Secondary | ICD-10-CM | POA: Diagnosis not present

## 2019-08-08 LAB — PROTIME-INR: Protime: 30.5 — AB (ref 10.0–13.8)

## 2019-08-08 LAB — POCT INR: INR: 2.9 — AB (ref <=1.1)

## 2019-08-08 NOTE — Progress Notes (Signed)
Location:  Occupational psychologist of Service:  SNF (31) Provider:   Cindi Carbon, ANP [place] [phone]   Gayland Curry, DO  Patient Care Team: Gayland Curry, DO as PCP - General (Geriatric Medicine) [name], MD as PCP - Cardiology (Cardiology)  Extended Emergency Contact Information Primary Emergency Contact: Vannote,[name] Address: [address]          [place], [place] [address] [name] of [place] Phone: [phone] Mobile Phone: [phone] Relation: Spouse Secondary Emergency Contact: Ganser,Jaydrian Address: [place] [address] [name] of [place] Phone: [phone] Mobile Phone: [phone] Relation: Son  Code Status:  DNR Goals of care: Advanced Directive information Advanced Directives 07/26/2019  Does Patient Have a Medical Advance Directive? Yes  Type of Paramedic of [place];Out of facility DNR (pink MOST or yellow form)  Does patient want to make changes to medical advance directive? No - Patient declined  Copy of [place] in Chart? Yes - validated most recent copy scanned in chart (See row information)  Pre-existing out of facility DNR order (yellow form or pink MOST form) -     Chief Complaint  Patient presents with  . Acute Visit    anticoagulation    HPI:  Pt is a 86 y.o. male seen today for an acute visit for coumadin management. He has a hx of longstanding afib and is taking coumadin. He has a hx of urinary retention and has a foley. Complete keflex for UTI on 1/27.  He did have blood in the catheter bag and needed his foley changed due to blockage but this has resoled. No more bleeding or hematuria.  Lab Results  Component Value Date   INR 2.9 (A) 08/08/2019   INR 2.6 (A) 08/01/2019   INR 2.4 (A) 07/28/2019   PROTIME 30.5 (A) 08/08/2019   PROTIME 28.1 (A) 08/01/2019   PROTIME 26.0 (A)  07/28/2019     Past Medical History:  Diagnosis Date  . Adult failure to thrive    Per incoming records from [facility]  . Allergic rhinitis    Per incoming records from [facility]  . Atrial fibrillation ([place])   . BPH (benign prostatic hyperplasia)    Per incoming records from [facility]  . Cancer of kidney [facility])    Right, Per incoming records from [facility]  . Cataract    Per incoming records from [facility]  . Cerebrovascular accident, late effects    Per incoming records from [facility]  . Chronic kidney disease, stage III (moderate)    Per incoming records from [facility]  . Claudication [facility])    Per incoming records from [facility]  . Cognitive impairment    Mild, Per incoming records from [facility]  . Constipation    Per incoming records from [facility]  . Dementia ([place])   . Diarrhea    Better off Aricept, Per incoming records from [facility]  . Diastolic dysfunction    on 2D Echo 07/2009 and 2016, Per incoming records from [facility]  . Diverticulosis    Mild, Per incoming records from [facility]  . Diverticulosis of colon (without mention of hemorrhage)   . ED (erectile dysfunction)    Per incoming records from [facility]  .  History of Coumadin therapy    [facility]  . History of echocardiogram 09/2014   Per incoming records from [facility]  . Hyperlipemia   . Hypertension   . Kidney mass    Per incoming records from [facility]  . Lower leg edema    Per incoming records from [facility]  . Melanoma ([place])   . Neuropathy    Per incoming records from [facility]  . OA (osteoarthritis)    Per incoming records from  [facility]  . Peripheral neuropathy    Per incoming records from [facility]  . Personal history of colonic polyps 1999 & 2004   adenomatous polyps  . Pulmonary arterial hypertension ([place])    Per incoming records from [facility]  . Rosacea    Per incoming records from [facility]  . Thrombocytopenia ([place]) 2017   Per incoming records from [facility]  . UTI (urinary tract infection)    Sepsis, Per incoming records from [facility]  . Ventricular hypertrophy   . Walker as ambulation aid    Per incoming records from [employer]   Past Surgical History:  Procedure Laterality Date  . APPENDECTOMY     Per incoming records from [facility]  . CATARACT EXTRACTION     Per incoming records from [facility]  . KNEE SURGERY     right  . PILONIDAL CYST DRAINAGE    . POLYPECTOMY     Per incoming records from [facility]  . schwanoma tumor lumbar spine    . SPINE SURGERY     tumor removed  . THORACIC 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, Per incoming records from [facility]  . TONSILLECTOMY AND ADENOIDECTOMY      Allergies  Allergen Reactions  . Penicillins Rash    Has patient had a PCN reaction causing immediate rash, facial/tongue/throat swelling, SOB or lightheadedness with hypotension: unknown Has patient had a PCN reaction causing severe rash involving mucus membranes or skin necrosis: unknown Has patient had a PCN reaction that required hospitalization : unknown Has patient had a PCN reaction occurring within the last 10 years: no If all of the above answers are "NO", then may proceed with Cephalosporin use.     Outpatient Encounter Medications as of 08/08/2019  Medication Sig  . acetaminophen (TYLENOL) 500 MG tablet Take 500 mg by mouth 2 (two) times daily. Scheduled and every 4  hours as needed (ending 05/05/2019)Document area of discomfort and effectiveness of medication.  Marland Kitchen atorvastatin (LIPITOR) 20 MG tablet Take 20 mg by mouth every evening.   . bisacodyl (DULCOLAX) 10 MG suppository Place 10 mg rectally as needed for moderate constipation. X 2 days when needed  . cholecalciferol (VITAMIN D3) 25 MCG (1000 UT) tablet Take 2,000 Units by mouth daily.   . DULoxetine (CYMBALTA) 30 MG capsule Take 30 mg by mouth daily.  . hydrochlorothiazide (MICROZIDE) 12.5 MG capsule Take 12.5 mg by mouth every other day.  . methenamine (MANDELAMINE) 1 g tablet Take 1,000 mg by mouth 2 (two) times daily.  Marland Kitchen warfarin (COUMADIN) 4 MG tablet Take 4 mg by mouth every other day. Give 4 mg M, W, F, Sunday  . warfarin (COUMADIN) 5 MG tablet Take 5 mg by mouth every other day. Give 5 mg Tuesday, Thursday, and Saturday  . [DISCONTINUED] cephALEXin (KEFLEX) 500 MG  capsule Take 500 mg by mouth 2 (two) times daily.   No facility-administered encounter medications on file as of 08/08/2019.    Review of Systems  Musculoskeletal: Positive for gait problem.  Hematological: Does not bruise/bleed easily.  Psychiatric/Behavioral: Positive for confusion.    Immunization History  Administered Date(s) Administered  . Influenza-Unspecified 04/17/2014, 05/01/2016, 04/15/2019  . Moderna SARS-COVID-2 Vaccination 07/12/2019  . Pneumococcal Conjugate-13 04/20/2014  . Pneumococcal Polysaccharide-23 07/08/2003, 03/31/2011  . Pneumococcal-Unspecified 01/26/2004  . Td 08/23/2007  . Tdap 01/07/2012  . Zoster 07/17/2005  . Zoster Recombinat (Shingrix) 10/05/2017, 12/09/2017   Pertinent  Health Maintenance Due  Topic Date Due  . INFLUENZA VACCINE  Completed  . PNA vac Low Risk Adult  Completed   Fall Risk  06/23/2019 05/07/2019 05/03/2019  Falls in the past year? 1 - -  Number falls in past yr: 1 - -  Injury with Fall? 1 - -  Risk for fall due to : History of fall(s);Impaired balance/gait;Impaired  mobility History of fall(s);Impaired balance/gait;Impaired mobility;Mental status change;Medication side effect History of fall(s);Impaired balance/gait;Impaired mobility;Medication side effect;Mental status change  Follow up Falls evaluation completed Falls evaluation completed;Education provided;Falls prevention discussed Falls evaluation completed;Education provided;Falls prevention discussed  [facility] - [facility] at [place]:    There were no vitals filed for this visit. There is no height or weight on file to calculate BMI. Physical Exam Vitals reviewed.  Neurological:     Mental Status: He is alert.     Labs reviewed: Recent Labs    03/22/19 0808 03/22/19 0808 05/01/19 1037 05/01/19 1037 05/19/19 0000 06/02/19 0132 07/28/19 0000  NA 140   < > 140  --  142 138 137  K 4.2   < > 4.4   < > 4.1 3.8 4.0  CL 104   < > 103   < > 107 104 101  CO2 28   < > 25   < > 26* 23 27*  GLUCOSE 206*  --  178*  --   --  125*  --   BUN 17   < > 27*  --  27* 20 20  CREATININE 1.30*  --  1.44*  --  1.2 1.32* 1.2  CALCIUM 10.3   < > 10.3  --   --  10.1 10.2   < > = values in this interval not displayed.   Recent Labs    03/22/19 0808 05/01/19 1037 05/19/19 0000  AST 17 26 19   ALT 15 21 19   ALKPHOS 73 65 77  BILITOT 1.2 1.5*  --   PROT 6.4* 6.8  --   ALBUMIN 4.1 4.0  --    Recent Labs    03/22/19 0808 03/22/19 0808 05/01/19 1037 05/01/19 1037 05/19/19 0000 06/02/19 0132 07/28/19 0000  WBC 2.3*  --  3.5*  --  3.7 4.1 2.7  NEUTROABS 1.1*   < > 2.4  --   --  3.0 2  HGB 16.0  --  15.5   < > 17.5 15.0 13.9  HCT 48.2   < > 47.4   < > 52 46.2 42  MCV 95.4  --  95.4  --   --  93.9  --   PLT 82*  --  PLATELET CLUMPS NOTED ON SMEAR, COUNT APPEARS DECREASED   < > 139* 78* 85*   < > = values in this interval not displayed.   Lab Results  Component Value Date  TSH 0.84 05/19/2019   Lab Results  Component Value Date   HGBA1C  6.0 (H) 07/19/2014   Lab Results  Component Value Date   CHOL 123 05/19/2019   HDL 30 (A) 05/19/2019   LDLCALC 62 05/19/2019   TRIG 154 05/19/2019    Significant Diagnostic Results in last 30 days:  No results found.  Assessment/Plan 1. Chronic anticoagulation INR therapeutic but trending upward. Continue current dose and recheck in 1 week.  2. Longstanding persistent atrial fibrillation (HCC) CHA2DS2-VASc score 6 Rate is controlled, coumadin is for CVA risk reduction     Family/ staff Communication: staff  Labs/tests ordered:  INR 1 week

## 2019-08-09 DIAGNOSIS — Z23 Encounter for immunization: Secondary | ICD-10-CM | POA: Diagnosis not present

## 2019-08-15 ENCOUNTER — Non-Acute Institutional Stay (SKILLED_NURSING_FACILITY): Payer: Medicare Other | Admitting: Adult Health

## 2019-08-15 DIAGNOSIS — Z7901 Long term (current) use of anticoagulants: Secondary | ICD-10-CM | POA: Diagnosis not present

## 2019-08-15 DIAGNOSIS — I4811 Longstanding persistent atrial fibrillation: Secondary | ICD-10-CM

## 2019-08-15 DIAGNOSIS — I4891 Unspecified atrial fibrillation: Secondary | ICD-10-CM | POA: Diagnosis not present

## 2019-08-15 LAB — POCT INR: INR: 3 — AB (ref 0.9–1.1)

## 2019-08-15 LAB — PROTIME-INR: Protime: 31.2 — AB (ref 10.0–13.8)

## 2019-08-17 ENCOUNTER — Telehealth: Payer: Self-pay | Admitting: Interventional Cardiology

## 2019-08-17 NOTE — Telephone Encounter (Signed)
Patient's wife is calling stating she spoke with [facility] after canceling the patient's in office appointment with Dr. Tamala Julian and they advised her to request a virtual appointment. The patient's wife is not sure if the patient has access to my chart in order to do a video chat. They only want the virtual appointment with Dr. Tamala Julian not a PA. The patient is requesting we reach out to [facility] in regards to scheduling this appointment. The phone number is [phone]. Please advise.

## 2019-08-17 NOTE — Telephone Encounter (Signed)
Spoke with representative from [facility] and scheduled pt for a virtual visit on 4/8 with Dr. Tamala Julian.

## 2019-08-18 ENCOUNTER — Encounter: Payer: Self-pay | Admitting: Adult Health

## 2019-08-18 ENCOUNTER — Non-Acute Institutional Stay (SKILLED_NURSING_FACILITY): Payer: Medicare Other | Admitting: Adult Health

## 2019-08-18 DIAGNOSIS — L309 Dermatitis, unspecified: Secondary | ICD-10-CM

## 2019-08-18 DIAGNOSIS — F0150 Vascular dementia without behavioral disturbance: Secondary | ICD-10-CM | POA: Diagnosis not present

## 2019-08-18 DIAGNOSIS — R251 Tremor, unspecified: Secondary | ICD-10-CM | POA: Diagnosis not present

## 2019-08-18 DIAGNOSIS — L97511 Non-pressure chronic ulcer of other part of right foot limited to breakdown of skin: Secondary | ICD-10-CM | POA: Diagnosis not present

## 2019-08-18 DIAGNOSIS — R9430 Abnormal result of cardiovascular function study, unspecified: Secondary | ICD-10-CM | POA: Diagnosis not present

## 2019-08-18 DIAGNOSIS — R338 Other retention of urine: Secondary | ICD-10-CM | POA: Diagnosis not present

## 2019-08-18 DIAGNOSIS — N401 Enlarged prostate with lower urinary tract symptoms: Secondary | ICD-10-CM

## 2019-08-18 NOTE — Progress Notes (Signed)
Location:  Occupational psychologist of Service:  SNF (31) Provider:   Cindi Carbon, ANP [place] [phone]   Gayland Curry, DO  Patient Care Team: Gayland Curry, DO as PCP - General (Geriatric Medicine) [name], MD as PCP - Cardiology (Cardiology)  Extended Emergency Contact Information Primary Emergency Contact: Fabro,[name] Address: [address]          [place], [place] [address] [name] of [place] Phone: [phone] Mobile Phone: [phone] Relation: Spouse Secondary Emergency Contact: Culbreath,Derric Address: [place] [address] [name] of [place] Phone: [phone] Mobile Phone: [phone] Relation: Son  Code Status:  DNR Goals of care: Advanced Directive information Advanced Directives 07/26/2019  Does Patient Have a Medical Advance Directive? Yes  Type of Paramedic of [place];Out of facility DNR (pink MOST or yellow form)  Does patient want to make changes to medical advance directive? No - Patient declined  Copy of [place] in Chart? Yes - validated most recent copy scanned in chart (See row information)  Pre-existing out of facility DNR order (yellow form or pink MOST form) -     Chief Complaint  Patient presents with  . Acute Visit    long term anticoagulation     HPI:  Pt is a 86 y.o. male seen today for an acute visit for coumadin management. He has a hx of longstanding afib and is taking coumadin. No issues with excessive bleeding or bruising are reported.  Lab Results  Component Value Date   INR 3.0 (A) 08/15/2019   INR 2.9 (A) 08/08/2019   INR 2.6 (A) 08/01/2019   PROTIME 31.2 (A) 08/15/2019   PROTIME 30.5 (A) 08/08/2019   PROTIME 28.1 (A) 08/01/2019     Past Medical History:  Diagnosis Date  . Adult failure to thrive    Per incoming records from [facility]  . Allergic  rhinitis    Per incoming records from [facility]  . Atrial fibrillation ([place])   . BPH (benign prostatic hyperplasia)    Per incoming records from [facility]  . Cancer of kidney [facility])    Right, Per incoming records from [facility]  . Cataract    Per incoming records from [facility]  . Cerebrovascular accident, late effects    Per incoming records from [facility]  . Chronic kidney disease, stage III (moderate)    Per incoming records from [facility]  . Claudication [facility])    Per incoming records from [facility]  . Cognitive impairment    Mild, Per incoming records from [facility]  . Constipation    Per incoming records from [facility]  . Dementia ([place])   . Diarrhea    Better off Aricept, Per incoming records from [facility]  . Diastolic dysfunction    on 2D Echo 07/2009 and 2016, Per incoming records from [facility]  . Diverticulosis    Mild, Per incoming records from [facility]  . Diverticulosis of colon (without mention of hemorrhage)   . ED (erectile dysfunction)    Per incoming records from [facility]  . History of Coumadin therapy    Per incoming records from [facility]  . History of echocardiogram 09/2014   Per incoming records from [facility]  .  Hyperlipemia   . Hypertension   . Kidney mass    Per incoming records from [facility]  . Lower leg edema    Per incoming records from [facility]  . Melanoma ([place])   . Neuropathy    Per incoming records from [facility]  . OA (osteoarthritis)    Per incoming records from [facility]  . Peripheral neuropathy    Per incoming records from [facility]  . Personal history of colonic polyps 1999 & 2004    adenomatous polyps  . Pulmonary arterial hypertension ([place])    Per incoming records from [facility]  . Rosacea    Per incoming records from [facility]  . Thrombocytopenia ([place]) 2017   Per incoming records from [facility]  . UTI (urinary tract infection)    Sepsis, Per incoming records from [facility]  . Ventricular hypertrophy   . Walker as ambulation aid    Per incoming records from [employer]   Past Surgical History:  Procedure Laterality Date  . APPENDECTOMY     Per incoming records from [facility]  . CATARACT EXTRACTION     Per incoming records from [facility]  . KNEE SURGERY     right  . PILONIDAL CYST DRAINAGE    . POLYPECTOMY     Per incoming records from [facility]  . schwanoma tumor lumbar spine    . SPINE SURGERY     tumor removed  . THORACIC 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, Per incoming records from [facility]  . TONSILLECTOMY AND ADENOIDECTOMY      Allergies  Allergen Reactions  . Penicillins Rash    Has patient had a PCN reaction causing immediate rash, facial/tongue/throat swelling, SOB or lightheadedness with hypotension: unknown Has patient had a PCN reaction causing severe rash involving mucus membranes or skin necrosis: unknown Has patient had a PCN reaction that required hospitalization : unknown Has patient had a PCN reaction occurring within the last 10 years: no If all of the above answers are "NO", then may proceed with Cephalosporin use.     Outpatient Encounter Medications as of 08/15/2019  Medication Sig  . acetaminophen (TYLENOL) 500 MG tablet Take 500 mg by mouth 2 (two) times daily. Scheduled and every 4 hours as needed (ending 05/05/2019)Document area of discomfort and effectiveness of medication.  Marland Kitchen atorvastatin (LIPITOR) 20 MG tablet Take 20 mg by mouth every  evening.   . bisacodyl (DULCOLAX) 10 MG suppository Place 10 mg rectally as needed for moderate constipation. X 2 days when needed  . cholecalciferol (VITAMIN D3) 25 MCG (1000 UT) tablet Take 2,000 Units by mouth daily.   . DULoxetine (CYMBALTA) 30 MG capsule Take 30 mg by mouth daily.  . hydrochlorothiazide (MICROZIDE) 12.5 MG capsule Take 12.5 mg by mouth every other day.  . methenamine (MANDELAMINE) 1 g tablet Take 1,000 mg by mouth 2 (two) times daily.  Marland Kitchen warfarin (COUMADIN) 4 MG tablet Take 4 mg by mouth every other day. Give 4 mg M, W, F, Sunday  . warfarin (COUMADIN) 5 MG tablet Take 5 mg by mouth every other day. Give 5 mg Tuesday, Thursday, and Saturday   No facility-administered encounter medications on file as of 08/15/2019.    Review of Systems  Musculoskeletal: Positive for gait problem.  Hematological: Does not bruise/bleed easily.  Psychiatric/Behavioral: Positive for confusion.    Immunization  History  Administered Date(s) Administered  . Influenza-Unspecified 04/17/2014, 05/01/2016, 04/15/2019  . Moderna SARS-COVID-2 Vaccination 07/12/2019, 08/09/2019  . Pneumococcal Conjugate-13 04/20/2014  . Pneumococcal Polysaccharide-23 07/08/2003, 03/31/2011  . Pneumococcal-Unspecified 01/26/2004  . Td 08/23/2007  . Tdap 01/07/2012  . Zoster 07/17/2005  . Zoster Recombinat (Shingrix) 10/05/2017, 12/09/2017   Pertinent  Health Maintenance Due  Topic Date Due  . INFLUENZA VACCINE  Completed  . PNA vac Low Risk Adult  Completed   Fall Risk  06/23/2019 05/07/2019 05/03/2019  Falls in the past year? 1 - -  Number falls in past yr: 1 - -  Injury with Fall? 1 - -  Risk for fall due to : History of fall(s);Impaired balance/gait;Impaired mobility History of fall(s);Impaired balance/gait;Impaired mobility;Mental status change;Medication side effect History of fall(s);Impaired balance/gait;Impaired mobility;Medication side effect;Mental status change  Follow up Falls evaluation  completed Falls evaluation completed;Education provided;Falls prevention discussed Falls evaluation completed;Education provided;Falls prevention discussed  [facility] - [facility] at [place]:    There were no vitals filed for this visit. There is no height or weight on file to calculate BMI. Physical Exam Vitals reviewed.  Neurological:     Mental Status: He is alert.     Labs reviewed: Recent Labs    03/22/19 0808 03/22/19 0808 05/01/19 1037 05/01/19 1037 05/19/19 0000 06/02/19 0132 07/28/19 0000  NA 140   < > 140  --  142 138 137  K 4.2   < > 4.4   < > 4.1 3.8 4.0  CL 104   < > 103   < > 107 104 101  CO2 28   < > 25   < > 26* 23 27*  GLUCOSE 206*  --  178*  --   --  125*  --   BUN 17   < > 27*  --  27* 20 20  CREATININE 1.30*  --  1.44*  --  1.2 1.32* 1.2  CALCIUM 10.3   < > 10.3  --   --  10.1 10.2   < > = values in this interval not displayed.   Recent Labs    03/22/19 0808 05/01/19 1037 05/19/19 0000  AST 17 26 19   ALT 15 21 19   ALKPHOS 73 65 77  BILITOT 1.2 1.5*  --   PROT 6.4* 6.8  --   ALBUMIN 4.1 4.0  --    Recent Labs    03/22/19 0808 03/22/19 0808 05/01/19 1037 05/01/19 1037 05/19/19 0000 06/02/19 0132 07/28/19 0000  WBC 2.3*  --  3.5*  --  3.7 4.1 2.7  NEUTROABS 1.1*   < > 2.4  --   --  3.0 2  HGB 16.0  --  15.5   < > 17.5 15.0 13.9  HCT 48.2   < > 47.4   < > 52 46.2 42  MCV 95.4  --  95.4  --   --  93.9  --   PLT 82*  --  PLATELET CLUMPS NOTED ON SMEAR, COUNT APPEARS DECREASED   < > 139* 78* 85*   < > = values in this interval not displayed.   Lab Results  Component Value Date   TSH 0.84 05/19/2019   Lab Results  Component Value Date   HGBA1C 6.0 (H) 07/19/2014   Lab Results  Component Value Date   CHOL 123 05/19/2019   HDL 30 (A) 05/19/2019   LDLCALC 62 05/19/2019   TRIG 154 05/19/2019  Significant Diagnostic Results in last 30 days:  No results found.   Assessment/Plan 1. Chronic anticoagulation INR therapeutic but trending upward. Continue current dose and recheck 2/12  2. Longstanding persistent atrial fibrillation (HCC) CHA2DS2-VASc score 6 Rate is controlled, coumadin is for CVA risk reduction     Family/ staff Communication: staff  Labs/tests ordered:  INR 2/12

## 2019-08-18 NOTE — Progress Notes (Signed)
Location:  Occupational psychologist of Service:  SNF (31) Provider:   Cindi Carbon, ANP [place] [phone]   Gayland Curry, DO  Patient Care Team: Gayland Curry, DO as PCP - General (Geriatric Medicine) [name], MD as PCP - Cardiology (Cardiology)  Extended Emergency Contact Information Primary Emergency Contact: Benkert,[name] Address: [address]          [place], [place] [address] [name] of [place] Phone: [phone] Mobile Phone: [phone] Relation: Spouse Secondary Emergency Contact: Bara,Romney Address: [place] [address] [name] of [place] Phone: [phone] Mobile Phone: [phone] Relation: Son  Code Status:  DNR Goals of care: Advanced Directive information Advanced Directives 07/26/2019  Does Patient Have a Medical Advance Directive? Yes  Type of Paramedic of [place];Out of facility DNR (pink MOST or yellow form)  Does patient want to make changes to medical advance directive? No - Patient declined  Copy of [place] in Chart? Yes - validated most recent copy scanned in chart (See row information)  Pre-existing out of facility DNR order (yellow form or pink MOST form) -     Chief Complaint  Patient presents with  . Medical Management of Chronic Issues    HPI:  Pt is a 86 y.o. male seen today for medical management of chronic diseases.    He has a dry itchy rash to his low back   Nurse reports right 5th toe wound not resolved with skin prep treatment. No pain to this area is reported.   He is walking with a walker and getting showers in his room with assistance by a personal care giver. He has a pill rolling tremor. No rigidity and bradykinesia. It does not interfere with his ADLs. He continues to comment on how beautiful his caregivers and can be flirty.  Continues with a catheter due to urinary  retention associated with BPH. No urinary symptoms are reported.      Past Surgical History:  Procedure Laterality Date  . APPENDECTOMY     Per incoming records from [facility]  . CATARACT EXTRACTION     Per incoming records from [facility]  . KNEE SURGERY     right  . PILONIDAL CYST DRAINAGE    . POLYPECTOMY     Per incoming records from [facility]  . schwanoma tumor lumbar spine    . SPINE SURGERY     tumor removed  . THORACIC 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, Per incoming records from [facility]  . TONSILLECTOMY AND ADENOIDECTOMY      Allergies  Allergen Reactions  . Penicillins Rash    Has patient had a PCN reaction causing immediate rash, facial/tongue/throat swelling, SOB or lightheadedness with hypotension: unknown Has patient had a PCN reaction causing severe rash involving mucus membranes or skin necrosis: unknown Has patient had a PCN reaction that required hospitalization : unknown Has patient had a PCN reaction occurring within the last 10 years: no If all of the above answers are "NO", then may proceed with Cephalosporin use.     Outpatient Encounter Medications as of 08/18/2019  Medication Sig  . triamcinolone lotion (KENALOG) 0.1 % Apply 1 application topically 2 (two) times daily. X 7 days then bid prn  . acetaminophen (TYLENOL) 500 MG tablet Take 500 mg by mouth  2 (two) times daily. Scheduled and every 4 hours as needed (ending 05/05/2019)Document area of discomfort and effectiveness of medication.  Marland Kitchen atorvastatin (LIPITOR) 20 MG tablet Take 20 mg by mouth every evening.   . bisacodyl (DULCOLAX) 10 MG suppository Place 10 mg rectally as needed for moderate constipation. X 2 days when needed  . cholecalciferol (VITAMIN D3) 25 MCG (1000 UT) tablet Take 2,000 Units by mouth daily.   . DULoxetine (CYMBALTA) 30 MG capsule Take 30 mg by mouth daily.  .  hydrochlorothiazide (MICROZIDE) 12.5 MG capsule Take 12.5 mg by mouth every other day.  . methenamine (MANDELAMINE) 1 g tablet Take 1,000 mg by mouth 2 (two) times daily.  Marland Kitchen warfarin (COUMADIN) 4 MG tablet Take 4 mg by mouth every other day. Give 4 mg M, W, F, Sunday  . warfarin (COUMADIN) 5 MG tablet Take 5 mg by mouth every other day. Give 5 mg Tuesday, Thursday, and Saturday   No facility-administered encounter medications on file as of 08/18/2019.        Labs reviewed:  Recent Labs    03/22/19 0808 05/01/19 1037 05/19/19 0000  AST 17 26 19   ALT 15 21 19   ALKPHOS 73 65 77  BILITOT 1.2 1.5*  --   PROT 6.4* 6.8  --   ALBUMIN 4.1 4.0  --    Recent Labs    03/22/19 0808 03/22/19 0808 05/01/19 1037 05/01/19 1037 05/19/19 0000 06/02/19 0132 07/28/19 0000  WBC 2.3*  --  3.5*  --  3.7 4.1 2.7  NEUTROABS 1.1*   < > 2.4  --   --  3.0 2  HGB 16.0  --  15.5   < > 17.5 15.0 13.9  HCT 48.2   < > 47.4   < > 52 46.2 42  MCV 95.4  --  95.4  --   --  93.9  --   PLT 82*  --  PLATELET CLUMPS NOTED ON SMEAR, COUNT APPEARS DECREASED   < > 139* 78* 85*   < > = values in this interval not displayed.   Lab Results  Component Value Date   TSH 0.84 05/19/2019   Lab Results  Component Value Date   HGBA1C 6.0 (H) 07/19/2014   Lab Results  Component Value Date   CHOL 123 05/19/2019   HDL 30 (A) 05/19/2019   LDLCALC 62 05/19/2019   TRIG 154 05/19/2019     Past Medical History:  Diagnosis Date  . Adult failure to thrive    Per incoming records from [facility]  . Allergic rhinitis    Per incoming records from [facility]  . Atrial fibrillation ([place])   . BPH (benign prostatic hyperplasia)    Per incoming records from [facility]  . Cancer of kidney [facility])    Right, Per incoming records from [facility]  . Cataract    Per incoming records from [facility]  . Cerebrovascular accident, late effects      Per incoming records from [facility]  . Chronic kidney disease, stage III (moderate)    Per incoming records from [facility]  . Claudication [facility])    Per incoming records from [facility]  . Cognitive impairment    Mild, Per incoming records from [facility]  . Constipation    Per incoming records from [facility]  . Dementia ([place])   . Diarrhea    Better off Aricept, Per incoming  records from [facility]  . Diastolic dysfunction    on 2D Echo 07/2009 and 2016, Per incoming records from [facility]  . Diverticulosis    Mild, Per incoming records from [facility]  . Diverticulosis of colon (without mention of hemorrhage)   . ED (erectile dysfunction)    Per incoming records from [facility]  . History of Coumadin therapy    Per incoming records from [facility]  . History of echocardiogram 09/2014   Per incoming records from [facility]  . Hyperlipemia   . Hypertension   . Kidney mass    Per incoming records from [facility]  . Lower leg edema    Per incoming records from [facility]  . Melanoma ([place])   . Neuropathy    Per incoming records from [facility]  . OA (osteoarthritis)    Per incoming records from [facility]  . Peripheral neuropathy    Per incoming records from [facility]  . Personal history of colonic polyps 1999 & 2004   adenomatous polyps  . Pulmonary arterial hypertension ([place])    Per incoming records from [facility]  . Rosacea    Per incoming records from [facility]  . Thrombocytopenia ([place]) 2017   Per incoming records from [facility]  . UTI (urinary tract infection)    Sepsis, Per incoming records from [facility]  . Ventricular  hypertrophy   . Walker as ambulation aid    Per incoming records from [employer] [facility]   Past Surgical History:  Procedure Laterality Date  . APPENDECTOMY     Per incoming records from [facility]  . CATARACT EXTRACTION     Per incoming records from [facility]  . KNEE SURGERY     right  . PILONIDAL CYST DRAINAGE    . POLYPECTOMY     Per incoming records from [facility]  . schwanoma tumor lumbar spine    . SPINE SURGERY     tumor removed  . THORACIC 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, Per incoming records from [facility]  . TONSILLECTOMY AND ADENOIDECTOMY      Allergies  Allergen Reactions  . Penicillins Rash    Has patient had a PCN reaction causing immediate rash, facial/tongue/throat swelling, SOB or lightheadedness with hypotension: unknown Has patient had a PCN reaction causing severe rash involving mucus membranes or skin necrosis: unknown Has patient had a PCN reaction that required hospitalization : unknown Has patient had a PCN reaction occurring within the last 10 years: no If all of the above answers are "NO", then may proceed with Cephalosporin use.     Outpatient Encounter Medications as of 08/18/2019  Medication Sig  . triamcinolone lotion (KENALOG) 0.1 % Apply 1 application topically 2 (two) times daily. X 7 days then bid prn  . acetaminophen (TYLENOL) 500 MG tablet Take 500 mg by mouth 2 (two) times daily. Scheduled and every 4 hours as needed (ending 05/05/2019)Document area of discomfort and effectiveness of medication.  Marland Kitchen atorvastatin (LIPITOR) 20 MG tablet Take 20 mg by mouth every evening.   . bisacodyl (DULCOLAX) 10 MG suppository Place 10 mg rectally as needed for moderate constipation. X 2 days when needed  . cholecalciferol (VITAMIN D3) 25 MCG (1000 UT) tablet Take 2,000 Units by mouth daily.   . DULoxetine (CYMBALTA) 30 MG capsule Take 30  mg by mouth  daily.  . hydrochlorothiazide (MICROZIDE) 12.5 MG capsule Take 12.5 mg by mouth every other day.  . methenamine (MANDELAMINE) 1 g tablet Take 1,000 mg by mouth 2 (two) times daily.  Marland Kitchen warfarin (COUMADIN) 4 MG tablet Take 4 mg by mouth every other day. Give 4 mg M, W, F, Sunday  . warfarin (COUMADIN) 5 MG tablet Take 5 mg by mouth every other day. Give 5 mg Tuesday, Thursday, and Saturday   No facility-administered encounter medications on file as of 08/18/2019.    Review of Systems  Constitutional: Negative for activity change, appetite change, chills, diaphoresis, fatigue, fever and unexpected weight change.  Respiratory: Negative for cough, shortness of breath, wheezing and stridor.   Cardiovascular: Positive for leg swelling. Negative for chest pain and palpitations.  Gastrointestinal: Negative for abdominal distention, abdominal pain, constipation and diarrhea.  Genitourinary: Negative for difficulty urinating, discharge, dysuria, penile pain and testicular pain.  Musculoskeletal: Positive for gait problem. Negative for arthralgias, back pain, joint swelling and myalgias.  Skin: Positive for rash and wound.  Neurological: Positive for tremors (pill rolling). Negative for dizziness, seizures, syncope, facial asymmetry, speech difficulty, weakness and headaches.  Hematological: Negative for adenopathy. Does not bruise/bleed easily.  Psychiatric/Behavioral: Positive for confusion. Negative for agitation and behavioral problems (makes comments about the way women look).    Immunization History  Administered Date(s) Administered  . Influenza-Unspecified 04/17/2014, 05/01/2016, 04/15/2019  . Moderna SARS-COVID-2 Vaccination 07/12/2019, 08/09/2019  . Pneumococcal Conjugate-13 04/20/2014  . Pneumococcal Polysaccharide-23 07/08/2003, 03/31/2011  . Pneumococcal-Unspecified 01/26/2004  . Td 08/23/2007  . Tdap 01/07/2012  . Zoster 07/17/2005  . Zoster Recombinat (Shingrix) 10/05/2017,  12/09/2017   Pertinent  Health Maintenance Due  Topic Date Due  . INFLUENZA VACCINE  Completed  . PNA vac Low Risk Adult  Completed   Fall Risk  06/23/2019 05/07/2019 05/03/2019  Falls in the past year? 1 - -  Number falls in past yr: 1 - -  Injury with Fall? 1 - -  Risk for fall due to : History of fall(s);Impaired balance/gait;Impaired mobility History of fall(s);Impaired balance/gait;Impaired mobility;Mental status change;Medication side effect History of fall(s);Impaired balance/gait;Impaired mobility;Medication side effect;Mental status change  Follow up Falls evaluation completed Falls evaluation completed;Education provided;Falls prevention discussed Falls evaluation completed;Education provided;Falls prevention discussed  Comment - done at [facility] all addressed at [place]:    Vitals:   08/18/19 1629  Weight: 255 lb 4.8 oz (115.8 kg)   Body mass index is 28.76 kg/m.  Wt Readings from Last 3 Encounters:  08/18/19 255 lb 4.8 oz (115.8 kg)  07/28/19 255 lb (115.7 kg)  07/26/19 262 lb 6.4 oz (119 kg)    Physical Exam Constitutional:      General: He is not in acute distress.    Appearance: He is not diaphoretic.  HENT:     Head: Normocephalic and atraumatic.  Neck:     Thyroid: No thyromegaly.     Vascular: No JVD.     Trachea: No tracheal deviation.  Cardiovascular:     Rate and Rhythm: Normal rate. Rhythm irregular.     Heart sounds: No murmur.  Pulmonary:     Effort: Pulmonary effort is normal. No respiratory distress.     Breath sounds: Normal breath sounds. No wheezing.  Abdominal:     General: Bowel sounds are normal. There is no distension.     Palpations: Abdomen is soft.     Tenderness: There is no abdominal tenderness.  Musculoskeletal:     Right lower leg: Edema (+1) present.     Left lower leg: Edema (+1) present.  Lymphadenopathy:     Cervical: No cervical adenopathy.  Skin:    General: Skin is warm and dry.      Findings: Rash (maculopapular to low back ) present.     Comments: Right 5th toe with necrotic wound. No drainage or tenderness.   Neurological:     Mental Status: He is alert and oriented to person, place, and time.     Cranial Nerves: No cranial nerve deficit.  Psychiatric:        Mood and Affect: Mood normal.     Labs reviewed: Recent Labs    03/22/19 0808 03/22/19 0808 05/01/19 1037 05/01/19 1037 05/19/19 0000 06/02/19 0132 07/28/19 0000  NA 140   < > 140  --  142 138 137  K 4.2   < > 4.4   < > 4.1 3.8 4.0  CL 104   < > 103   < > 107 104 101  CO2 28   < > 25   < > 26* 23 27*  GLUCOSE 206*  --  178*  --   --  125*  --   BUN 17   < > 27*  --  27* 20 20  CREATININE 1.30*  --  1.44*  --  1.2 1.32* 1.2  CALCIUM 10.3   < > 10.3  --   --  10.1 10.2   < > = values in this interval not displayed.   Recent Labs    03/22/19 0808 05/01/19 1037 05/19/19 0000  AST 17 26 19   ALT 15 21 19   ALKPHOS 73 65 77  BILITOT 1.2 1.5*  --   PROT 6.4* 6.8  --   ALBUMIN 4.1 4.0  --    Recent Labs    03/22/19 0808 03/22/19 0808 05/01/19 1037 05/01/19 1037 05/19/19 0000 06/02/19 0132 07/28/19 0000  WBC 2.3*  --  3.5*  --  3.7 4.1 2.7  NEUTROABS 1.1*   < > 2.4  --   --  3.0 2  HGB 16.0  --  15.5   < > 17.5 15.0 13.9  HCT 48.2   < > 47.4   < > 52 46.2 42  MCV 95.4  --  95.4  --   --  93.9  --   PLT 82*  --  PLATELET CLUMPS NOTED ON SMEAR, COUNT APPEARS DECREASED   < > 139* 78* 85*   < > = values in this interval not displayed.   Lab Results  Component Value Date   TSH 0.84 05/19/2019   Lab Results  Component Value Date   HGBA1C 6.0 (H) 07/19/2014   Lab Results  Component Value Date   CHOL 123 05/19/2019   HDL 30 (A) 05/19/2019   LDLCALC 62 05/19/2019   TRIG 154 05/19/2019    Significant Diagnostic Results in last 30 days:  No results found.  Assessment/Plan 1. Dermatitis Likely due to dry skin Triamcinolone 0.1 % to back bid x 7 days then bid prn rash  2. Ulcer  of right foot, limited to breakdown of skin ([place]) Noted to right 5th toe. Black ulceration which is non healing.  Continue to monitor wound. Check A1C and ABI.   3. Vascular dementia without behavioral disturbance ([place]) Has had a progression in dementia over the past year but has adjusted well to skilled care.  Continues on cymbalta  for mood stabilization. Has no s/e.  Continue current regimen.    4. Urinary retention due to benign prostatic hyperplasia No current issues. Continue catheter maintenance at [facility] and methenamine prescribed by urology for UTI prevention.   5. Pill rolling tremor  Noted to bilateral hands. No rigidity or bradykinesia noted. Continue to monitor.   Family/ staff Communication: staff  Labs/tests ordered:  ABI to BLE A1C and INR 2/15

## 2019-08-22 DIAGNOSIS — I482 Chronic atrial fibrillation, unspecified: Secondary | ICD-10-CM | POA: Diagnosis not present

## 2019-08-22 DIAGNOSIS — E119 Type 2 diabetes mellitus without complications: Secondary | ICD-10-CM | POA: Diagnosis not present

## 2019-08-22 LAB — HEMOGLOBIN A1C: Hemoglobin A1C: 6

## 2019-08-22 LAB — PROTIME-INR: Protime: 25.1 — AB (ref 10.0–13.8)

## 2019-08-22 LAB — POCT INR: INR: 2.3 — AB (ref <=1.1)

## 2019-09-02 ENCOUNTER — Non-Acute Institutional Stay (SKILLED_NURSING_FACILITY): Payer: Medicare Other | Admitting: Adult Health

## 2019-09-02 ENCOUNTER — Encounter: Payer: Self-pay | Admitting: Adult Health

## 2019-09-02 DIAGNOSIS — I739 Peripheral vascular disease, unspecified: Secondary | ICD-10-CM | POA: Diagnosis not present

## 2019-09-02 DIAGNOSIS — L97511 Non-pressure chronic ulcer of other part of right foot limited to breakdown of skin: Secondary | ICD-10-CM | POA: Diagnosis not present

## 2019-09-02 NOTE — Progress Notes (Signed)
Location:  Occupational psychologist of Service:  SNF (31) Provider:   Cindi Carbon, ANP [place] [phone]   Gayland Curry, DO  Patient Care Team: Gayland Curry, DO as PCP - General (Geriatric Medicine) [name], MD as PCP - Cardiology (Cardiology)  Extended Emergency Contact Information Primary Emergency Contact: Weinel,[name] Address: [address]          [place], [place] [address] [name] of [place] Phone: [phone] Mobile Phone: [phone] Relation: Spouse Secondary Emergency Contact: Kerwin,Cage Address: [place] [address] [name] of [place] Phone: [phone] Mobile Phone: [phone] Relation: Son  Code Status:  DNR Goals of care: Advanced Directive information Advanced Directives 07/26/2019  Does Patient Have a Medical Advance Directive? Yes  Type of Paramedic of [place];Out of facility DNR (pink MOST or yellow form)  Does patient want to make changes to medical advance directive? No - Patient declined  Copy of [place] in Chart? Yes - validated most recent copy scanned in chart (See row information)  Pre-existing out of facility DNR order (yellow form or pink MOST form) -     Chief Complaint  Patient presents with  . Acute Visit    f/u foot wound and pvd    HPI:  Pt is a 86 y.o. male seen today for an acute visit for f/u regarding pvd and foot ulcer. He was noted to have a black ulceration to the right fifth toe. This has been treated with skin prep and has resolved. He has not had any pain to his feet or claudication. No further wounds are noted to his feet. He is ambulating with a walker. BP is ranging 130-140 range. LDL 62 A1C 6% 08/22/19  ABI 08/18/19: Left 0.62 moderate disease, Right 1.05 WNL   Past Medical History:  Diagnosis Date  . Adult failure to thrive    Per incoming records from  [facility]  . Allergic rhinitis    Per incoming records from [facility]  . Atrial fibrillation ([place])   . BPH (benign prostatic hyperplasia)    Per incoming records from [facility]  . Cancer of kidney [facility])    Right, Per incoming records from [facility]  . Cataract    Per incoming records from [facility]  . Cerebrovascular accident, late effects    Per incoming records from [facility]  . Chronic kidney disease, stage III (moderate)    Per incoming records from [facility]  . Claudication [facility])    Per incoming records from [facility]  . Cognitive impairment    Mild, Per incoming records from [facility]  . Constipation    Per incoming records from [facility]  . Dementia ([place])   . Diarrhea    Better off Aricept, Per incoming records from [facility]  . Diastolic dysfunction    on 2D Echo 07/2009 and 2016, Per incoming records from [facility]  . Diverticulosis    Mild, Per incoming records from [facility]  . Diverticulosis of colon (without mention of hemorrhage)   . ED (erectile dysfunction)    Per incoming records from [facility]  . History of Coumadin therapy    Per incoming records from [facility]  . History of echocardiogram 09/2014   Per  incoming records from [facility]  . Hyperlipemia   . Hypertension   . Kidney mass    Per incoming records from [facility]  . Lower leg edema    Per incoming records from [facility]  . Melanoma ([place])   . Neuropathy    Per incoming records from [facility]  . OA (osteoarthritis)    Per incoming records from [facility]  . Peripheral neuropathy    Per incoming records from [facility]  .  Personal history of colonic polyps 1999 & 2004   adenomatous polyps  . Pulmonary arterial hypertension ([place])    Per incoming records from [facility]  . Rosacea    Per incoming records from [facility]  . Thrombocytopenia ([place]) 2017   Per incoming records from [facility]  . UTI (urinary tract infection)    Sepsis, Per incoming records from [facility]  . Ventricular hypertrophy   . Walker as ambulation aid    Per incoming records from [employer]   Past Surgical History:  Procedure Laterality Date  . APPENDECTOMY     Per incoming records from [facility]  . CATARACT EXTRACTION     Per incoming records from [facility]  . KNEE SURGERY     right  . PILONIDAL CYST DRAINAGE    . POLYPECTOMY     Per incoming records from [facility]  . schwanoma tumor lumbar spine    . SPINE SURGERY     tumor removed  . THORACIC 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, Per incoming records from [facility]  . TONSILLECTOMY AND ADENOIDECTOMY      Allergies  Allergen Reactions  . Penicillins Rash    Has patient had a PCN reaction causing immediate rash, facial/tongue/throat swelling, SOB or lightheadedness with hypotension: unknown Has patient had a PCN reaction causing severe rash involving mucus membranes or skin necrosis: unknown Has patient had a PCN reaction that required hospitalization : unknown Has patient had a PCN reaction occurring within the last 10 years: no If all of the above answers are "NO", then may proceed with Cephalosporin use.     Outpatient Encounter Medications as of 09/02/2019  Medication Sig  . acetaminophen (TYLENOL) 500 MG tablet Take 500 mg by mouth 2 (two) times daily. Scheduled and every 4 hours as needed (ending 05/05/2019)Document area of discomfort and effectiveness of medication.  Marland Kitchen atorvastatin  (LIPITOR) 20 MG tablet Take 20 mg by mouth every evening.   . bisacodyl (DULCOLAX) 10 MG suppository Place 10 mg rectally as needed for moderate constipation. X 2 days when needed  . cholecalciferol (VITAMIN D3) 25 MCG (1000 UT) tablet Take 2,000 Units by mouth daily.   . DULoxetine (CYMBALTA) 30 MG capsule Take 30 mg by mouth daily.  . hydrochlorothiazide (MICROZIDE) 12.5 MG capsule Take 12.5 mg by mouth every other day.  . methenamine (MANDELAMINE) 1 g tablet Take 1,000 mg by mouth 2 (two) times daily.  Marland Kitchen triamcinolone lotion (KENALOG) 0.1 % Apply 1 application topically 2 (two) times daily. X 7 days then bid prn  . warfarin (COUMADIN) 4 MG tablet Take 4 mg by mouth every other day. Give 4 mg M, W, F, Sunday  . warfarin (COUMADIN) 5 MG tablet Take 5 mg by mouth every other day. Give 5 mg Tuesday, Thursday, and Saturday   No facility-administered encounter medications on file as  of 09/02/2019.    Review of Systems  Constitutional: Negative for activity change, appetite change, chills, diaphoresis, fatigue, fever and unexpected weight change.  Respiratory: Negative for cough, shortness of breath, wheezing and stridor.   Cardiovascular: Positive for leg swelling. Negative for chest pain and palpitations.  Gastrointestinal: Negative for abdominal distention, abdominal pain, constipation and diarrhea.  Genitourinary: Negative for difficulty urinating and dysuria.  Musculoskeletal: Positive for gait problem. Negative for arthralgias, back pain, joint swelling and myalgias.  Neurological: Negative for dizziness, seizures, syncope, facial asymmetry, speech difficulty, weakness and headaches.  Hematological: Negative for adenopathy. Does not bruise/bleed easily.  Psychiatric/Behavioral: Positive for confusion. Negative for agitation and behavioral problems.    Immunization History  Administered Date(s) Administered  . Influenza-Unspecified 04/17/2014, 05/01/2016, 04/15/2019  . Moderna  SARS-COVID-2 Vaccination 07/12/2019, 08/09/2019  . Pneumococcal Conjugate-13 04/20/2014  . Pneumococcal Polysaccharide-23 07/08/2003, 03/31/2011  . Pneumococcal-Unspecified 01/26/2004  . Td 08/23/2007  . Tdap 01/07/2012  . Zoster 07/17/2005  . Zoster Recombinat (Shingrix) 10/05/2017, 12/09/2017   Pertinent  Health Maintenance Due  Topic Date Due  . INFLUENZA VACCINE  Completed  . PNA vac Low Risk Adult  Completed   Fall Risk  06/23/2019 05/07/2019 05/03/2019  Falls in the past year? 1 - -  Number falls in past yr: 1 - -  Injury with Fall? 1 - -  Risk for fall due to : History of fall(s);Impaired balance/gait;Impaired mobility History of fall(s);Impaired balance/gait;Impaired mobility;Mental status change;Medication side effect History of fall(s);Impaired balance/gait;Impaired mobility;Medication side effect;Mental status change  Follow up Falls evaluation completed Falls evaluation completed;Education provided;Falls prevention discussed Falls evaluation completed;Education provided;Falls prevention discussed  Comment - done at [facility] all addressed at [place]:    There were no vitals filed for this visit. There is no height or weight on file to calculate BMI. Physical Exam Constitutional:      General: He is not in acute distress.    Appearance: He is not diaphoretic.  HENT:     Head: Normocephalic and atraumatic.  Neck:     Thyroid: No thyromegaly.     Vascular: No carotid bruit or JVD.     Trachea: No tracheal deviation.  Cardiovascular:     Rate and Rhythm: Normal rate and regular rhythm.     Pulses:          Dorsalis pedis pulses are 2+ on the right side and 1+ on the left side.     Heart sounds: No murmur.  Pulmonary:     Effort: Pulmonary effort is normal. No respiratory distress.     Breath sounds: Normal breath sounds. No wheezing.  Abdominal:     General: Bowel sounds are normal. There is no distension.     Palpations: Abdomen  is soft.     Tenderness: There is no abdominal tenderness.  Musculoskeletal:     Cervical back: Normal range of motion and neck supple.     Right foot: Normal range of motion. No deformity.     Left foot: Normal range of motion. No deformity.     Comments: +1 BLE edema  Feet:     Right foot:     Skin integrity: Skin integrity normal.     Left foot:     Skin integrity: Skin integrity normal.  Lymphadenopathy:     Cervical: No cervical adenopathy.  Skin:    General: Skin is warm and dry.  Neurological:     General: No focal deficit present.  Mental Status: He is alert. Mental status is at baseline.     Labs reviewed: Recent Labs    03/22/19 0808 03/22/19 0808 05/01/19 1037 05/01/19 1037 05/19/19 0000 06/02/19 0132 07/28/19 0000  NA 140   < > 140  --  142 138 137  K 4.2   < > 4.4   < > 4.1 3.8 4.0  CL 104   < > 103   < > 107 104 101  CO2 28   < > 25   < > 26* 23 27*  GLUCOSE 206*  --  178*  --   --  125*  --   BUN 17   < > 27*  --  27* 20 20  CREATININE 1.30*  --  1.44*  --  1.2 1.32* 1.2  CALCIUM 10.3   < > 10.3  --   --  10.1 10.2   < > = values in this interval not displayed.   Recent Labs    03/22/19 0808 05/01/19 1037 05/19/19 0000  AST 17 26 19   ALT 15 21 19   ALKPHOS 73 65 77  BILITOT 1.2 1.5*  --   PROT 6.4* 6.8  --   ALBUMIN 4.1 4.0  --    Recent Labs    03/22/19 0808 03/22/19 0808 05/01/19 1037 05/01/19 1037 05/19/19 0000 06/02/19 0132 07/28/19 0000  WBC 2.3*  --  3.5*  --  3.7 4.1 2.7  NEUTROABS 1.1*   < > 2.4  --   --  3.0 2  HGB 16.0  --  15.5   < > 17.5 15.0 13.9  HCT 48.2   < > 47.4   < > 52 46.2 42  MCV 95.4  --  95.4  --   --  93.9  --   PLT 82*  --  PLATELET CLUMPS NOTED ON SMEAR, COUNT APPEARS DECREASED   < > 139* 78* 85*   < > = values in this interval not displayed.   Lab Results  Component Value Date   TSH 0.84 05/19/2019   Lab Results  Component Value Date   HGBA1C 6 08/22/2019   Lab Results  Component Value Date    CHOL 123 05/19/2019   HDL 30 (A) 05/19/2019   LDLCALC 62 05/19/2019   TRIG 154 05/19/2019    Significant Diagnostic Results in last 30 days:  No results found.  Assessment/Plan 1. Peripheral vascular disease ([place]) To the left lower ext No current pain or wounds BP controlled A1C 6% Currently on anticoagulation for afib with therapeutic INR LDL at goal 62 on lipitor Continue to monitor  2. Ulcer of right foot, limited to breakdown of skin ([name]) Healed     Family/ staff Communication: discussed with resident  Labs/tests ordered:  NA

## 2019-09-08 ENCOUNTER — Non-Acute Institutional Stay (SKILLED_NURSING_FACILITY): Payer: Medicare Other | Admitting: Adult Health

## 2019-09-08 ENCOUNTER — Encounter: Payer: Self-pay | Admitting: Adult Health

## 2019-09-08 DIAGNOSIS — N401 Enlarged prostate with lower urinary tract symptoms: Secondary | ICD-10-CM | POA: Diagnosis not present

## 2019-09-08 DIAGNOSIS — R338 Other retention of urine: Secondary | ICD-10-CM

## 2019-09-08 DIAGNOSIS — I4811 Longstanding persistent atrial fibrillation: Secondary | ICD-10-CM | POA: Diagnosis not present

## 2019-09-08 DIAGNOSIS — D696 Thrombocytopenia, unspecified: Secondary | ICD-10-CM | POA: Diagnosis not present

## 2019-09-08 DIAGNOSIS — F0150 Vascular dementia without behavioral disturbance: Secondary | ICD-10-CM

## 2019-09-08 DIAGNOSIS — I5032 Chronic diastolic (congestive) heart failure: Secondary | ICD-10-CM

## 2019-09-08 DIAGNOSIS — I10 Essential (primary) hypertension: Secondary | ICD-10-CM

## 2019-09-08 NOTE — Progress Notes (Signed)
Location:  Occupational psychologist of Service:  SNF (31) Provider:   Cindi Carbon, ANP [place] [phone]  Gayland Curry, DO  Patient Care Team: Gayland Curry, DO as PCP - General (Geriatric Medicine) [name], MD as PCP - Cardiology (Cardiology)  Extended Emergency Contact Information Primary Emergency Contact: Smedberg,[name] Address: [address]          [place], [place] [address] [name] of [place] Phone: [phone] Mobile Phone: [phone] Relation: Spouse Secondary Emergency Contact: Cuen,Troi Address: [place] [address] [name] of [place] Phone: [phone] Mobile Phone: [phone] Relation: Son  Code Status:  DNR Goals of care: Advanced Directive information Advanced Directives 07/26/2019  Does Patient Have a Medical Advance Directive? Yes  Type of Paramedic of [place];Out of facility DNR (pink MOST or yellow form)  Does patient want to make changes to medical advance directive? No - Patient declined  Copy of [place] in Chart? Yes - validated most recent copy scanned in chart (See row information)  Pre-existing out of facility DNR order (yellow form or pink MOST form) -     Chief Complaint  Patient presents with  . Medical Management of Chronic Issues    HPI:  Pt is a 86 y.o. male seen today for medical management of chronic diseases.    The nurses continue to have issues with his catheter which clogs after changing it due to bleeding. The bleeding subsides but sometimes it can lead to several changes and flushes. They have notified his urologist and there is an apt later this month to address the need for a suprapubic catheter. At this time he has no bleeding, fever, bladder pain, back pain or frequency.  His care giver is at the bedside and she reports there are times where he is slower when he walks.  No issues with increased rigidity or tremor. He has a pill rolling tremor to both hands that has not changed and does not affect his care per the staff. He continues to be able to feed himself, communicate, and ambulate. Has memory loss and did not tolerate meds for this in the past.    Functional status: ambulatory with a walker. Continent of stool. Past Medical History:  Diagnosis Date  . Adult failure to thrive    Per incoming [facility]  . Allergic rhinitis    Per incoming records from [facility]  . Atrial fibrillation ([place])   . BPH (benign prostatic hyperplasia)    Per incoming records from [facility]  . Cancer of kidney [facility])    Right, Per incoming records from [facility]  . Cataract    Per incoming records from [facility]  . Cerebrovascular accident, late effects    Per incoming records from [facility]  . Chronic kidney disease, stage III (moderate)    Per incoming records from [facility]  . Claudication [facility])    Per incoming records from [facility]  . Cognitive impairment    Mild, Per incoming records from [facility]  . Constipation    Per incoming records from [facility]  . Dementia ([place])   . Diarrhea    Better off Aricept, Per incoming records from [facility]  . Diastolic dysfunction    on 2D Echo 07/2009  and 2016, Per incoming records from [facility]  . Diverticulosis    Mild, Per incoming records from [facility]  . Diverticulosis of colon (without mention of hemorrhage)   . ED (erectile dysfunction)    Per incoming records from [facility]  . History of Coumadin therapy    Per incoming records from [facility]  . History of echocardiogram 09/2014   Per incoming records from [facility]  .  Hyperlipemia   . Hypertension   . Kidney mass    Per incoming records from [facility]  . Lower leg edema    Per incoming records from [facility]  . Melanoma ([place])   . Neuropathy    Per incoming records from [facility]  . OA (osteoarthritis)    Per incoming records from [facility]  . Peripheral neuropathy    Per incoming records from [facility]  . Personal history of colonic polyps 1999 & 2004   adenomatous polyps  . Pulmonary arterial hypertension ([place])    Per incoming records from [facility]  . Rosacea    Per incoming records from [facility]  . Thrombocytopenia ([place]) 2017   Per incoming records from [facility]  . UTI (urinary tract infection)    Sepsis, Per incoming records from [facility]  . Ventricular hypertrophy   . Walker as ambulation aid    Per incoming records from [employer]   Past Surgical History:  Procedure Laterality Date  . APPENDECTOMY     Per incoming records from [facility]  . CATARACT EXTRACTION     Per incoming records from [facility]  . KNEE SURGERY     right  . PILONIDAL CYST DRAINAGE    . POLYPECTOMY     Per incoming records from [facility]  . schwanoma tumor lumbar spine    . SPINE SURGERY     tumor removed  . THORACIC 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, Per incoming records from [facility]  . TONSILLECTOMY AND ADENOIDECTOMY      Allergies  Allergen Reactions  . Penicillins Rash    Has patient had a PCN reaction causing immediate rash, facial/tongue/throat swelling, SOB or lightheadedness with hypotension: unknown Has patient had a PCN reaction causing severe rash involving mucus membranes or skin necrosis: unknown Has patient had a PCN reaction that required hospitalization :  unknown Has patient had a PCN reaction occurring within the last 10 years: no If all of the above answers are "NO", then may proceed with Cephalosporin use.     Outpatient Encounter Medications as of 09/08/2019  Medication Sig  . acetaminophen (TYLENOL) 500 MG tablet Take 500 mg by mouth 2 (two) times daily. Scheduled and every 4 hours as needed (ending 05/05/2019)Document area of discomfort and effectiveness of medication.  Marland Kitchen atorvastatin (LIPITOR) 20 MG tablet Take 20 mg by mouth every evening.   . cholecalciferol (VITAMIN D3) 25 MCG (1000 UT) tablet Take 2,000 Units by mouth daily.   . DULoxetine (CYMBALTA) 30 MG capsule Take 30 mg by mouth daily.  . hydrochlorothiazide (MICROZIDE) 12.5 MG capsule Take 12.5 mg by mouth every other day.  . methenamine (MANDELAMINE) 1 g tablet Take 1,000 mg by mouth 2 (two) times daily.  Marland Kitchen triamcinolone lotion (KENALOG) 0.1 % Apply 1 application topically 2 (two) times daily as needed.   . warfarin (COUMADIN)  4 MG tablet Take 4 mg by mouth every other day. Give 4 mg M, W, F, Sunday  . warfarin (COUMADIN) 5 MG tablet Take 5 mg by mouth every other day. Give 5 mg Tuesday, Thursday, and Saturday  . [DISCONTINUED] bisacodyl (DULCOLAX) 10 MG suppository Place 10 mg rectally as needed for moderate constipation. X 2 days when needed   No facility-administered encounter medications on file as of 09/08/2019.    Review of Systems  Constitutional: Negative for activity change, appetite change, chills, diaphoresis, fatigue, fever and unexpected weight change.  HENT: Negative for congestion.   Respiratory: Negative for cough, shortness of breath, wheezing and stridor.   Cardiovascular: Positive for leg swelling. Negative for chest pain and palpitations.  Gastrointestinal: Negative for abdominal distention, abdominal pain, constipation and diarrhea.  Genitourinary: Positive for hematuria (intermittent with cath changes). Negative for difficulty urinating and dysuria.   Musculoskeletal: Positive for gait problem. Negative for arthralgias, back pain, joint swelling and myalgias.  Neurological: Positive for tremors (pill rolling to both hands). Negative for dizziness, seizures, syncope, facial asymmetry, speech difficulty, weakness and headaches.  Hematological: Negative for adenopathy. Does not bruise/bleed easily.  Psychiatric/Behavioral: Positive for confusion. Negative for agitation and behavioral problems.    Immunization History  Administered Date(s) Administered  . Influenza-Unspecified 04/17/2014, 05/01/2016, 04/15/2019  . Moderna SARS-COVID-2 Vaccination 07/12/2019, 08/09/2019  . Pneumococcal Conjugate-13 04/20/2014  . Pneumococcal Polysaccharide-23 07/08/2003, 03/31/2011  . Pneumococcal-Unspecified 01/26/2004  . Td 08/23/2007  . Tdap 01/07/2012  . Zoster 07/17/2005  . Zoster Recombinat (Shingrix) 10/05/2017, 12/09/2017   Pertinent  Health Maintenance Due  Topic Date Due  . INFLUENZA VACCINE  Completed  . PNA vac Low Risk Adult  Completed   Fall Risk  06/23/2019 05/07/2019 05/03/2019  Falls in the past year? 1 - -  Number falls in past yr: 1 - -  Injury with Fall? 1 - -  Risk for fall due to : History of fall(s);Impaired balance/gait;Impaired mobility History of fall(s);Impaired balance/gait;Impaired mobility;Mental status change;Medication side effect History of fall(s);Impaired balance/gait;Impaired mobility;Medication side effect;Mental status change  Follow up Falls evaluation completed Falls evaluation completed;Education provided;Falls prevention discussed Falls evaluation completed;Education provided;Falls prevention discussed  [facility] - [facility] all addressed at [place]:    Vitals:   09/08/19 1114  Weight: 254 lb 3.2 oz (115.3 kg)   Body mass index is 28.64 kg/m. Physical Exam Vitals and nursing note reviewed.  Constitutional:      General: He is not in acute distress.     Appearance: He is not diaphoretic.  HENT:     Head: Normocephalic and atraumatic.  Neck:     Thyroid: No thyromegaly.     Vascular: No JVD.     Trachea: No tracheal deviation.  Cardiovascular:     Rate and Rhythm: Normal rate. Rhythm irregular.     Heart sounds: No murmur.  Pulmonary:     Effort: Pulmonary effort is normal. No respiratory distress.     Breath sounds: Normal breath sounds. No wheezing.  Abdominal:     General: Bowel sounds are normal. There is no distension.     Palpations: Abdomen is soft.     Tenderness: There is no abdominal tenderness. There is no right CVA tenderness or left CVA tenderness.  Musculoskeletal:        General: No swelling, tenderness, deformity or signs of injury.     Comments: Trace BLE edema   Lymphadenopathy:     Cervical: No cervical  adenopathy.  Skin:    General: Skin is warm and dry.  Neurological:     General: No focal deficit present.     Mental Status: He is alert. Mental status is at baseline.     Comments: No rigidity or tremor noted on exam.      Labs reviewed: Recent Labs    03/22/19 0808 03/22/19 0808 05/01/19 1037 05/01/19 1037 05/19/19 0000 06/02/19 0132 07/28/19 0000  NA 140   < > 140  --  142 138 137  K 4.2   < > 4.4   < > 4.1 3.8 4.0  CL 104   < > 103   < > 107 104 101  CO2 28   < > 25   < > 26* 23 27*  GLUCOSE 206*  --  178*  --   --  125*  --   BUN 17   < > 27*  --  27* 20 20  CREATININE 1.30*  --  1.44*  --  1.2 1.32* 1.2  CALCIUM 10.3   < > 10.3  --   --  10.1 10.2   < > = values in this interval not displayed.   Recent Labs    03/22/19 0808 05/01/19 1037 05/19/19 0000  AST 17 26 19   ALT 15 21 19   ALKPHOS 73 65 77  BILITOT 1.2 1.5*  --   PROT 6.4* 6.8  --   ALBUMIN 4.1 4.0  --    Recent Labs    03/22/19 0808 03/22/19 0808 05/01/19 1037 05/01/19 1037 05/19/19 0000 06/02/19 0132 07/28/19 0000  WBC 2.3*  --  3.5*  --  3.7 4.1 2.7  NEUTROABS 1.1*   < > 2.4  --   --  3.0 2  HGB 16.0  --   15.5   < > 17.5 15.0 13.9  HCT 48.2   < > 47.4   < > 52 46.2 42  MCV 95.4  --  95.4  --   --  93.9  --   PLT 82*  --  PLATELET CLUMPS NOTED ON SMEAR, COUNT APPEARS DECREASED   < > 139* 78* 85*   < > = values in this interval not displayed.   Lab Results  Component Value Date   TSH 0.84 05/19/2019   Lab Results  Component Value Date   HGBA1C 6 08/22/2019   Lab Results  Component Value Date   CHOL 123 05/19/2019   HDL 30 (A) 05/19/2019   LDLCALC 62 05/19/2019   TRIG 154 05/19/2019    Significant Diagnostic Results in last 30 days:  No results found.  Assessment/Plan 1. Vascular dementia without behavioral disturbance ([place]) Continue supportive care in the skilled environment.   2. Longstanding persistent atrial fibrillation ([place]) Rate controlled without medication. Continue coumadin for CVA risk reduction.   3. Chronic diastolic heart failure ([place]) Compensated. Continue hctz 12.5 mg qod and compression hose.   4. Essential hypertension Controlled, continue current medications.   5. Urinary retention due to benign prostatic hyperplasia F/U with urology see HPI. No acute issues at this time.  Continue methenamine 1 gram bid for UTI prevention   6. Thrombocytopenia ([place]) Due to possible myelodysplasia vs alcoholism.  Followed by hematology. Plts will need to be monitored closely due to coumadin usage.  Lab Results  Component Value Date   PLT 85 (A) 07/28/2019     Family/ staff Communication: nurse, caretaker and resident   Labs/tests ordered:  NA

## 2019-09-19 ENCOUNTER — Non-Acute Institutional Stay (SKILLED_NURSING_FACILITY): Payer: Medicare Other | Admitting: Adult Health

## 2019-09-19 ENCOUNTER — Encounter: Payer: Self-pay | Admitting: Adult Health

## 2019-09-19 DIAGNOSIS — I4811 Longstanding persistent atrial fibrillation: Secondary | ICD-10-CM | POA: Diagnosis not present

## 2019-09-19 DIAGNOSIS — Z7901 Long term (current) use of anticoagulants: Secondary | ICD-10-CM | POA: Diagnosis not present

## 2019-09-19 DIAGNOSIS — I4891 Unspecified atrial fibrillation: Secondary | ICD-10-CM | POA: Diagnosis not present

## 2019-09-19 LAB — POCT INR: INR: 3 — AB (ref 0.9–1.1)

## 2019-09-19 LAB — PROTIME-INR: Protime: 30.8 — AB (ref 10.0–13.8)

## 2019-09-19 NOTE — Progress Notes (Signed)
Location:  Occupational psychologist of Service:  SNF (31) Provider:   Cindi Carbon, ANP [place] [phone]   Gayland Curry, DO  Patient Care Team: Gayland Curry, DO as PCP - General (Geriatric Medicine) [name], MD as PCP - Cardiology (Cardiology)  Extended Emergency Contact Information Primary Emergency Contact: Ohmer,[name] Address: [address]          [place], [place] [address] [name] of [place] Phone: [phone] Mobile Phone: [phone] Relation: Spouse Secondary Emergency Contact: Nalepa,Bacilio Address: [place] [address] [name] of [place] Phone: [phone] Mobile Phone: [phone] Relation: Son  Code Status:  DNR Goals of care: Advanced Directive information Advanced Directives 07/26/2019  Does Patient Have a Medical Advance Directive? Yes  Type of Paramedic of [place];Out of facility DNR (pink MOST or yellow form)  Does patient want to make changes to medical advance directive? No - Patient declined  Copy of [place] in Chart? Yes - validated most recent copy scanned in chart (See row information)  Pre-existing out of facility DNR order (yellow form or pink MOST form) -     Chief Complaint  Patient presents with  . Acute Visit    long term anticoagulation     HPI:  Pt is a 86 y.o. male seen today for an acute visit for coumadin management. He has a hx of longstanding afib and is taking coumadin. No issues with excessive bleeding or bruising are reported.  Lab Results  Component Value Date   INR 3.0 (A) 09/19/2019   INR 2.3 (A) 08/22/2019   INR 3.0 (A) 08/15/2019   PROTIME 30.8 (A) 09/19/2019   PROTIME 25.1 (A) 08/22/2019   PROTIME 31.2 (A) 08/15/2019     Past Medical History:  Diagnosis Date  . Adult failure to thrive    Per incoming records from [facility]  . Allergic  rhinitis    Per [facility]  . Atrial fibrillation ([place])   . BPH (benign prostatic hyperplasia)    Per incoming records from [facility]  . Cancer of kidney [facility])    Right, Per incoming records from [facility]  . Cataract    Per incoming records from [facility]  . Cerebrovascular accident, late effects    Per incoming records from [facility]  . Chronic kidney disease, stage III (moderate)    Per incoming records from [facility]  . Claudication [facility])    Per incoming records from [facility]  . Cognitive impairment    Mild, Per incoming records from [facility]  . Constipation    Per incoming records from [facility]  . Dementia ([place])   . Diarrhea    Better off Aricept, Per incoming records from [facility]  . Diastolic dysfunction    on 2D Echo 07/2009 and 2016, Per incoming records from [facility]  . Diverticulosis    Mild, Per incoming records from [facility]  . Diverticulosis of colon (without mention of hemorrhage)   . ED (erectile dysfunction)    Per incoming records from [facility]  . History of Coumadin therapy    Per incoming records from [facility]  . History of echocardiogram 09/2014   Per incoming records from [facility]  .  Hyperlipemia   . Hypertension   . Kidney mass    Per incoming records from [facility]  . Lower leg edema    Per incoming records from [facility]  . Melanoma ([place])   . Neuropathy    Per incoming records from [facility]  . OA (osteoarthritis)    Per incoming records from [facility]  . Peripheral neuropathy    Per incoming records from [facility]  . Personal history of colonic polyps 1999 & 2004     adenomatous polyps  . Pulmonary arterial hypertension ([place])    Per incoming records from [facility]  . Rosacea    Per incoming records from [facility]  . Thrombocytopenia ([place]) 2017   Per incoming records from [facility]  . UTI (urinary tract infection)    Sepsis, Per incoming records from [facility]  . Ventricular hypertrophy   . Walker as ambulation aid    Per incoming records from [employer]   Past Surgical History:  Procedure Laterality Date  . APPENDECTOMY     Per incoming records from [facility]  . CATARACT EXTRACTION     Per incoming records from [facility]  . KNEE SURGERY     right  . PILONIDAL CYST DRAINAGE    . POLYPECTOMY     Per incoming records from [facility]  . schwanoma tumor lumbar spine    . SPINE SURGERY     tumor removed  . THORACIC 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, Per incoming records from [facility]  . TONSILLECTOMY AND ADENOIDECTOMY      Allergies  Allergen Reactions  . Penicillins Rash    Has patient had a PCN reaction causing immediate rash, facial/tongue/throat swelling, SOB or lightheadedness with hypotension: unknown Has patient had a PCN reaction causing severe rash involving mucus membranes or skin necrosis: unknown Has patient had a PCN reaction that required hospitalization : unknown Has patient had a PCN reaction occurring within the last 10 years: no If all of the above answers are "NO", then may proceed with Cephalosporin use.     Outpatient Encounter Medications as of 09/19/2019  Medication Sig  . acetaminophen (TYLENOL) 500 MG tablet Take 500 mg by mouth 2 (two) times daily. Scheduled and every 4 hours as needed (ending 05/05/2019)Document area of discomfort and effectiveness of medication.  Marland Kitchen atorvastatin (LIPITOR) 20 MG tablet Take 20 mg by mouth every  evening.   . cholecalciferol (VITAMIN D3) 25 MCG (1000 UT) tablet Take 2,000 Units by mouth daily.   . DULoxetine (CYMBALTA) 30 MG capsule Take 30 mg by mouth daily.  . hydrochlorothiazide (MICROZIDE) 12.5 MG capsule Take 12.5 mg by mouth every other day.  . methenamine (MANDELAMINE) 1 g tablet Take 1,000 mg by mouth 2 (two) times daily.  Marland Kitchen triamcinolone lotion (KENALOG) 0.1 % Apply 1 application topically 2 (two) times daily as needed.   . warfarin (COUMADIN) 4 MG tablet Take 4 mg by mouth every other day. Give 4 mg M, W, F, Sunday  . warfarin (COUMADIN) 5 MG tablet Take 5 mg by mouth every other day. Give 5 mg Tuesday, Thursday, and Saturday   No facility-administered encounter medications on file as of 09/19/2019.    Review of Systems  Musculoskeletal: Positive for gait problem.  Hematological: Does not bruise/bleed easily.  Psychiatric/Behavioral: Positive for confusion.    Immunization History  Administered Date(s) Administered  . Influenza-Unspecified 04/17/2014, 05/01/2016, 04/15/2019  . Moderna SARS-COVID-2 Vaccination 07/12/2019, 08/09/2019  . Pneumococcal Conjugate-13 04/20/2014  . Pneumococcal Polysaccharide-23 07/08/2003, 03/31/2011  . Pneumococcal-Unspecified 01/26/2004  . Td 08/23/2007  . Tdap 01/07/2012  . Zoster 07/17/2005  . Zoster Recombinat (Shingrix) 10/05/2017, 12/09/2017   Pertinent  Health Maintenance Due  Topic Date Due  . INFLUENZA VACCINE  Completed  . PNA vac Low Risk Adult  Completed   Fall Risk  06/23/2019 05/07/2019 05/03/2019  Falls in the past year? 1 - -  Number falls in past yr: 1 - -  Injury with Fall? 1 - -  Risk for fall due to : History of fall(s);Impaired balance/gait;Impaired mobility History of fall(s);Impaired balance/gait;Impaired mobility;Mental status change;Medication side effect History of fall(s);Impaired balance/gait;Impaired mobility;Medication side effect;Mental status change  Follow up Falls evaluation completed Falls  evaluation completed;Education provided;Falls prevention discussed Falls evaluation completed;Education provided;Falls prevention discussed  [facility] at [place]:    There were no vitals filed for this visit. There is no height or weight on file to calculate BMI. Physical Exam Vitals reviewed.  Neurological:     Mental Status: He is alert.     Labs reviewed: Recent Labs    03/22/19 0808 03/22/19 0808 05/01/19 1037 05/01/19 1037 05/19/19 0000 06/02/19 0132 07/28/19 0000  NA 140   < > 140  --  142 138 137  K 4.2   < > 4.4   < > 4.1 3.8 4.0  CL 104   < > 103   < > 107 104 101  CO2 28   < > 25   < > 26* 23 27*  GLUCOSE 206*  --  178*  --   --  125*  --   BUN 17   < > 27*  --  27* 20 20  CREATININE 1.30*  --  1.44*  --  1.2 1.32* 1.2  CALCIUM 10.3   < > 10.3  --   --  10.1 10.2   < > = values in this interval not displayed.   Recent Labs    03/22/19 0808 05/01/19 1037 05/19/19 0000  AST 17 26 19   ALT 15 21 19   ALKPHOS 73 65 77  BILITOT 1.2 1.5*  --   PROT 6.4* 6.8  --   ALBUMIN 4.1 4.0  --    Recent Labs    03/22/19 0808 03/22/19 0808 05/01/19 1037 05/01/19 1037 05/19/19 0000 06/02/19 0132 07/28/19 0000  WBC 2.3*  --  3.5*  --  3.7 4.1 2.7  NEUTROABS 1.1*   < > 2.4  --   --  3.0 2  HGB 16.0  --  15.5   < > 17.5 15.0 13.9  HCT 48.2   < > 47.4   < > 52 46.2 42  MCV 95.4  --  95.4  --   --  93.9  --   PLT 82*  --  PLATELET CLUMPS NOTED ON SMEAR, COUNT APPEARS DECREASED   < > 139* 78* 85*   < > = values in this interval not displayed.   Lab Results  Component Value Date   TSH 0.84 05/19/2019   Lab Results  Component Value Date   HGBA1C 6 08/22/2019   Lab Results  Component Value Date   CHOL 123 05/19/2019   HDL 30 (A) 05/19/2019   LDLCALC 62 05/19/2019   TRIG 154 05/19/2019  Significant Diagnostic Results in last 30 days:  No results found.  Assessment/Plan 1. Chronic  anticoagulation INR therapeutic but border line at 3.0. trended down on its own last check  Continue current dose and recheck INR 1 week   2. Longstanding persistent atrial fibrillation (HCC) CHA2DS2-VASc score 6 Rate is controlled, coumadin is for CVA risk reduction     Family/ staff Communication: staff  Labs/tests ordered:  INR 1 week

## 2019-09-26 ENCOUNTER — Non-Acute Institutional Stay (SKILLED_NURSING_FACILITY): Payer: Medicare Other | Admitting: Adult Health

## 2019-09-26 ENCOUNTER — Encounter: Payer: Self-pay | Admitting: Adult Health

## 2019-09-26 DIAGNOSIS — R3914 Feeling of incomplete bladder emptying: Secondary | ICD-10-CM | POA: Diagnosis not present

## 2019-09-26 DIAGNOSIS — N401 Enlarged prostate with lower urinary tract symptoms: Secondary | ICD-10-CM | POA: Diagnosis not present

## 2019-09-26 DIAGNOSIS — I4891 Unspecified atrial fibrillation: Secondary | ICD-10-CM | POA: Diagnosis not present

## 2019-09-26 DIAGNOSIS — I4811 Longstanding persistent atrial fibrillation: Secondary | ICD-10-CM

## 2019-09-26 DIAGNOSIS — Z7901 Long term (current) use of anticoagulants: Secondary | ICD-10-CM | POA: Diagnosis not present

## 2019-09-26 LAB — POCT INR: INR: 2.7 — AB (ref 0.9–1.1)

## 2019-09-26 LAB — PROTIME-INR: Protime: 29 — AB (ref 10.0–13.8)

## 2019-09-26 NOTE — Progress Notes (Signed)
Location:  Occupational psychologist of Service:  SNF (31) Provider:   Cindi Carbon, ANP [place] [phone]   Gayland Curry, DO  Patient Care Team: Gayland Curry, DO as PCP - General (Geriatric Medicine) [name], MD as PCP - Cardiology (Cardiology)  Extended Emergency Contact Information Primary Emergency Contact: Hedlund,[name] Address: [address]          [place], [place] [address] [name] of [place] Phone: [phone] Mobile Phone: [phone] Relation: Spouse Secondary Emergency Contact: Salts,Christoher Address: [place] [address] [name] of [place] Phone: [phone] Mobile Phone: [phone] Relation: Son  Code Status:  DNR Goals of care: Advanced Directive information Advanced Directives 07/26/2019  Does Patient Have a Medical Advance Directive? Yes  Type of Paramedic of [place];Out of facility DNR (pink MOST or yellow form)  Does patient want to make changes to medical advance directive? No - Patient declined  Copy of [place] in Chart? Yes - validated most recent copy scanned in chart (See row information)  Pre-existing out of facility DNR order (yellow form or pink MOST form) -     Chief Complaint  Patient presents with  . Acute Visit    coumadin management    HPI:  Pt is a 86 y.o. male seen today for an acute visit for coumadin management. He has a hx of longstanding afib and is taking coumadin. No issues with excessive bleeding or bruising are reported. He does have a past hx of bleeding from his foley bag after insertion and is going to urology today to see about an suprapubic catheter. Lab Results  Component Value Date   INR 2.7 (A) 09/26/2019   INR 3.0 (A) 09/19/2019   INR 2.3 (A) 08/22/2019   PROTIME 29.0 (A) 09/26/2019   PROTIME 30.8 (A) 09/19/2019   PROTIME 25.1 (A) 08/22/2019     Past  Medical History:  Diagnosis Date  . Adult failure to thrive    Per incoming records from [facility]  . Allergic rhinitis    Per incoming records from [facility]  . Atrial fibrillation ([place])   . BPH (benign prostatic hyperplasia)    Per incoming records from [facility]  . Cancer of kidney [facility])    Right, Per incoming records from [facility]  . Cataract    Per incoming records from [facility]  . Cerebrovascular accident, late effects    Per incoming records from [facility]  . Chronic kidney disease, stage III (moderate)    Per incoming records from [facility]  . Claudication [facility])    Per incoming records from [facility]  . Cognitive impairment    Mild, Per incoming records from [facility]  . Constipation    Per incoming records from [facility]  . Dementia ([place])   . Diarrhea    Better off Aricept, Per incoming records from [facility]  . Diastolic dysfunction    on 2D Echo 07/2009 and 2016, Per incoming records from [facility]  . Diverticulosis    Mild, Per incoming records from [facility]  . Diverticulosis of colon (without mention of hemorrhage)   . ED (erectile dysfunction)    Per incoming records from [facility]  . History of Coumadin therapy    Per  incoming records from [facility]  . History of echocardiogram 09/2014   Per incoming records from [facility]  . Hyperlipemia   . Hypertension   . Kidney mass    Per incoming records from [facility]  . Lower leg edema    Per incoming records from [facility]  . Melanoma ([place])   . Neuropathy    Per incoming records from [facility]  . OA (osteoarthritis)    Per incoming records from [facility]   . Peripheral neuropathy    Per incoming records from [facility]  . Personal history of colonic polyps 1999 & 2004   adenomatous polyps  . Pulmonary arterial hypertension ([place])    Per incoming records from [facility]  . Rosacea    Per incoming records from [facility]  . Thrombocytopenia ([place]) 2017   Per incoming records from [facility]  . UTI (urinary tract infection)    Sepsis, Per incoming records from [facility]  . Ventricular hypertrophy   . Walker as ambulation aid    Per incoming records from [employer]   Past Surgical History:  Procedure Laterality Date  . APPENDECTOMY     Per incoming records from [facility]  . CATARACT EXTRACTION     Per incoming records from [facility]  . KNEE SURGERY     right  . PILONIDAL CYST DRAINAGE    . POLYPECTOMY     Per incoming records from [facility]  . schwanoma tumor lumbar spine    . SPINE SURGERY     tumor removed  . THORACIC 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, Per incoming records from [facility]  . TONSILLECTOMY AND ADENOIDECTOMY      Allergies  Allergen Reactions  . Penicillins Rash    Has patient had a PCN reaction causing immediate rash, facial/tongue/throat swelling, SOB or lightheadedness with hypotension: unknown Has patient had a PCN reaction causing severe rash involving mucus membranes or skin necrosis: unknown Has patient had a PCN reaction that required hospitalization : unknown Has patient had a PCN reaction occurring within the last 10 years: no If all of the above answers are "NO", then may proceed with Cephalosporin use.     Outpatient Encounter Medications as of 09/26/2019  Medication Sig  . acetaminophen (TYLENOL) 500 MG tablet Take 500 mg by mouth 2 (two) times daily. Scheduled and every 4 hours as needed (ending  05/05/2019)Document area of discomfort and effectiveness of medication.  Marland Kitchen atorvastatin (LIPITOR) 20 MG tablet Take 20 mg by mouth every evening.   . cholecalciferol (VITAMIN D3) 25 MCG (1000 UT) tablet Take 2,000 Units by mouth daily.   . DULoxetine (CYMBALTA) 30 MG capsule Take 30 mg by mouth daily.  . hydrochlorothiazide (MICROZIDE) 12.5 MG capsule Take 12.5 mg by mouth every other day.  . methenamine (MANDELAMINE) 1 g tablet Take 1,000 mg by mouth 2 (two) times daily.  Marland Kitchen triamcinolone lotion (KENALOG) 0.1 % Apply 1 application topically 2 (two) times daily as needed.   . warfarin (COUMADIN) 4 MG tablet Take 4 mg by mouth every other day. Give 4 mg M, W, F, Sunday  . warfarin (COUMADIN) 5 MG tablet Take 5 mg by mouth every other day. Give 5 mg Tuesday, Thursday, and Saturday   No facility-administered encounter medications on file as of 09/26/2019.    Review of Systems  Musculoskeletal: Positive for gait problem.  Hematological: Does not bruise/bleed easily.  Psychiatric/Behavioral: Positive for confusion.    Immunization History  Administered Date(s) Administered  . Influenza-Unspecified 04/17/2014, 05/01/2016, 04/15/2019  . Moderna SARS-COVID-2 Vaccination 07/12/2019, 08/09/2019  . Pneumococcal Conjugate-13 04/20/2014  . Pneumococcal Polysaccharide-23 07/08/2003, 03/31/2011  . Pneumococcal-Unspecified 01/26/2004  . Td 08/23/2007  . Tdap 01/07/2012  . Zoster 07/17/2005  . Zoster Recombinat (Shingrix) 10/05/2017, 12/09/2017   Pertinent  Health Maintenance Due  Topic Date Due  . INFLUENZA VACCINE  Completed  . PNA vac Low Risk Adult  Completed   Fall Risk  06/23/2019 05/07/2019 05/03/2019  Falls in the past year? 1 - -  Number falls in past yr: 1 - -  Injury with Fall? 1 - -  Risk for fall due to : History of fall(s);Impaired balance/gait;Impaired mobility History of fall(s);Impaired balance/gait;Impaired mobility;Mental status change;Medication side effect History of  fall(s);Impaired balance/gait;Impaired mobility;Medication side effect;Mental status change  Follow up Falls evaluation completed Falls evaluation completed;Education provided;Falls prevention discussed Falls evaluation completed;Education provided;Falls prevention discussed  Comment - [facility] all addressed at [place]:    There were no vitals filed for this visit. There is no height or weight on file to calculate BMI. Physical Exam Vitals reviewed.  Neurological:     Mental Status: He is alert.     Labs reviewed: Recent Labs    03/22/19 0808 03/22/19 0808 05/01/19 1037 05/01/19 1037 05/19/19 0000 06/02/19 0132 07/28/19 0000  NA 140   < > 140  --  142 138 137  K 4.2   < > 4.4   < > 4.1 3.8 4.0  CL 104   < > 103   < > 107 104 101  CO2 28   < > 25   < > 26* 23 27*  GLUCOSE 206*  --  178*  --   --  125*  --   BUN 17   < > 27*  --  27* 20 20  CREATININE 1.30*  --  1.44*  --  1.2 1.32* 1.2  CALCIUM 10.3   < > 10.3  --   --  10.1 10.2   < > = values in this interval not displayed.   Recent Labs    03/22/19 0808 05/01/19 1037 05/19/19 0000  AST 17 26 19   ALT 15 21 19   ALKPHOS 73 65 77  BILITOT 1.2 1.5*  --   PROT 6.4* 6.8  --   ALBUMIN 4.1 4.0  --    Recent Labs    03/22/19 0808 03/22/19 0808 05/01/19 1037 05/01/19 1037 05/19/19 0000 06/02/19 0132 07/28/19 0000  WBC 2.3*  --  3.5*  --  3.7 4.1 2.7  NEUTROABS 1.1*   < > 2.4  --   --  3.0 2  HGB 16.0  --  15.5   < > 17.5 15.0 13.9  HCT 48.2   < > 47.4   < > 52 46.2 42  MCV 95.4  --  95.4  --   --  93.9  --   PLT 82*  --  PLATELET CLUMPS NOTED ON SMEAR, COUNT APPEARS DECREASED   < > 139* 78* 85*   < > = values in this interval not displayed.   Lab Results  Component Value Date   TSH 0.84 05/19/2019   Lab Results  Component Value Date   HGBA1C 6 08/22/2019   Lab Results  Component Value Date  CHOL 123 05/19/2019   HDL 30 (A) 05/19/2019   LDLCALC 62 05/19/2019    TRIG 154 05/19/2019    Significant Diagnostic Results in last 30 days:  No results found.  Assessment/Plan 1. Chronic anticoagulation Continue Coumadin 4 mg M, W, F, Sunday and 5 mg Tuesday, Thurs, and Saturday  2. Longstanding persistent atrial fibrillation (HCC) CHA2DS2-VASc score 6 Rate is controlled, coumadin is for CVA risk reduction     Family/ staff Communication: staff  Labs/tests ordered:  INR in 2 weeks

## 2019-09-27 ENCOUNTER — Other Ambulatory Visit (HOSPITAL_COMMUNITY): Payer: Self-pay | Admitting: Urology

## 2019-09-27 DIAGNOSIS — R339 Retention of urine, unspecified: Secondary | ICD-10-CM

## 2019-10-04 ENCOUNTER — Other Ambulatory Visit: Payer: Self-pay | Admitting: Radiology

## 2019-10-06 ENCOUNTER — Other Ambulatory Visit: Payer: Self-pay

## 2019-10-06 ENCOUNTER — Ambulatory Visit (HOSPITAL_COMMUNITY)
Admission: RE | Admit: 2019-10-06 | Discharge: 2019-10-06 | Disposition: A | Discharge status: Home / Self Care | Payer: Medicare Other | Source: Ambulatory Visit | Attending: Urology | Admitting: Urology

## 2019-10-06 ENCOUNTER — Encounter (HOSPITAL_COMMUNITY): Payer: Self-pay

## 2019-10-06 DIAGNOSIS — R339 Retention of urine, unspecified: Secondary | ICD-10-CM | POA: Diagnosis not present

## 2019-10-06 DIAGNOSIS — G629 Polyneuropathy, unspecified: Secondary | ICD-10-CM | POA: Insufficient documentation

## 2019-10-06 DIAGNOSIS — N401 Enlarged prostate with lower urinary tract symptoms: Secondary | ICD-10-CM | POA: Insufficient documentation

## 2019-10-06 DIAGNOSIS — Z79899 Other long term (current) drug therapy: Secondary | ICD-10-CM | POA: Insufficient documentation

## 2019-10-06 DIAGNOSIS — Z85820 Personal history of malignant melanoma of skin: Secondary | ICD-10-CM | POA: Insufficient documentation

## 2019-10-06 DIAGNOSIS — I129 Hypertensive chronic kidney disease with stage 1 through stage 4 chronic kidney disease, or unspecified chronic kidney disease: Secondary | ICD-10-CM | POA: Diagnosis not present

## 2019-10-06 DIAGNOSIS — E785 Hyperlipidemia, unspecified: Secondary | ICD-10-CM | POA: Insufficient documentation

## 2019-10-06 DIAGNOSIS — Z823 Family history of stroke: Secondary | ICD-10-CM | POA: Insufficient documentation

## 2019-10-06 DIAGNOSIS — Z8673 Personal history of transient ischemic attack (TIA), and cerebral infarction without residual deficits: Secondary | ICD-10-CM | POA: Insufficient documentation

## 2019-10-06 DIAGNOSIS — F0390 Unspecified dementia without behavioral disturbance: Secondary | ICD-10-CM | POA: Diagnosis not present

## 2019-10-06 DIAGNOSIS — M1990 Unspecified osteoarthritis, unspecified site: Secondary | ICD-10-CM | POA: Insufficient documentation

## 2019-10-06 DIAGNOSIS — I4891 Unspecified atrial fibrillation: Secondary | ICD-10-CM | POA: Insufficient documentation

## 2019-10-06 DIAGNOSIS — I272 Pulmonary hypertension, unspecified: Secondary | ICD-10-CM | POA: Insufficient documentation

## 2019-10-06 DIAGNOSIS — N183 Chronic kidney disease, stage 3 unspecified: Secondary | ICD-10-CM | POA: Insufficient documentation

## 2019-10-06 DIAGNOSIS — Z85528 Personal history of other malignant neoplasm of kidney: Secondary | ICD-10-CM | POA: Insufficient documentation

## 2019-10-06 DIAGNOSIS — Z87891 Personal history of nicotine dependence: Secondary | ICD-10-CM | POA: Insufficient documentation

## 2019-10-06 DIAGNOSIS — Z7901 Long term (current) use of anticoagulants: Secondary | ICD-10-CM | POA: Insufficient documentation

## 2019-10-06 DIAGNOSIS — Z880 Allergy status to penicillin: Secondary | ICD-10-CM | POA: Diagnosis not present

## 2019-10-06 DIAGNOSIS — R338 Other retention of urine: Secondary | ICD-10-CM | POA: Diagnosis not present

## 2019-10-06 LAB — CBC WITH DIFFERENTIAL/PLATELET
Abs Immature Granulocytes: 0.03 10*3/uL (ref 0.00–0.07)
Basophils Absolute: 0 10*3/uL (ref 0.0–0.1)
Basophils Relative: 0 %
Eosinophils Absolute: 0 10*3/uL (ref 0.0–0.5)
Eosinophils Relative: 1 %
HCT: 43.9 % (ref 39.0–52.0)
Hemoglobin: 13.7 g/dL (ref 13.0–17.0)
Immature Granulocytes: 1 %
Lymphocytes Relative: 28 %
Lymphs Abs: 0.7 10*3/uL (ref 0.7–4.0)
MCH: 29.3 pg (ref 26.0–34.0)
MCHC: 31.2 g/dL (ref 30.0–36.0)
MCV: 94 fL (ref 80.0–100.0)
Monocytes Absolute: 0.3 10*3/uL (ref 0.1–1.0)
Monocytes Relative: 14 %
Neutro Abs: 1.4 10*3/uL — ABNORMAL LOW (ref 1.7–7.7)
Neutrophils Relative %: 56 %
Platelets: 109 10*3/uL — ABNORMAL LOW (ref 150–400)
RBC: 4.67 MIL/uL (ref 4.22–5.81)
RDW: 15.4 % (ref 11.5–15.5)
WBC: 2.4 10*3/uL — ABNORMAL LOW (ref 4.0–10.5)
nRBC: 0 % (ref 0.0–0.2)

## 2019-10-06 LAB — PROTIME-INR
INR: 1.3 — ABNORMAL HIGH (ref 0.8–1.2)
Prothrombin Time: 16.3 seconds — ABNORMAL HIGH (ref 11.4–15.2)

## 2019-10-06 LAB — BASIC METABOLIC PANEL
Anion gap: 9 (ref 5–15)
BUN: 19 mg/dL (ref 8–23)
CO2: 25 mmol/L (ref 22–32)
Calcium: 9.6 mg/dL (ref 8.9–10.3)
Chloride: 103 mmol/L (ref 98–111)
Creatinine, Ser: 1.01 mg/dL (ref 0.61–1.24)
GFR calc Af Amer: >60 mL/min (ref >60)
GFR calc non Af Amer: >60 mL/min (ref >60)
Glucose, Bld: 104 mg/dL — ABNORMAL HIGH (ref 70–99)
Potassium: 4 mmol/L (ref 3.5–5.1)
Sodium: 137 mmol/L (ref 135–145)

## 2019-10-06 MED ORDER — MIDAZOLAM HCL 2 MG/2ML IJ SOLN
INTRAMUSCULAR | Status: AC | PRN
  Administered 2019-10-06 (×2): 0.5 mg via INTRAVENOUS
  Administered 2019-10-06: 1 mg via INTRAVENOUS

## 2019-10-06 MED ORDER — FLUMAZENIL 0.5 MG/5ML IV SOLN
INTRAVENOUS | Status: AC
  Filled 2019-10-06: qty 5

## 2019-10-06 MED ORDER — NALOXONE HCL 0.4 MG/ML IJ SOLN
INTRAMUSCULAR | Status: AC
  Filled 2019-10-06: qty 1

## 2019-10-06 MED ORDER — SODIUM CHLORIDE 0.9 % IV SOLN: INTRAVENOUS | Status: DC

## 2019-10-06 MED ORDER — FENTANYL CITRATE (PF) 100 MCG/2ML IJ SOLN
INTRAMUSCULAR | Status: AC | PRN
  Administered 2019-10-06: 50 ug via INTRAVENOUS
  Administered 2019-10-06 (×2): 25 ug via INTRAVENOUS

## 2019-10-06 MED ORDER — CIPROFLOXACIN IN D5W 400 MG/200ML IV SOLN
INTRAVENOUS | Status: AC
  Filled 2019-10-06: qty 200

## 2019-10-06 MED ORDER — LIDOCAINE HCL (PF) 1 % IJ SOLN
INTRAMUSCULAR | Status: AC | PRN
  Administered 2019-10-06: 10 mL via INTRADERMAL

## 2019-10-06 MED ORDER — MIDAZOLAM HCL 2 MG/2ML IJ SOLN
INTRAMUSCULAR | Status: AC
  Filled 2019-10-06: qty 4

## 2019-10-06 MED ORDER — CIPROFLOXACIN IN D5W 400 MG/200ML IV SOLN
400.0000 mg | INTRAVENOUS | Status: AC
  Administered 2019-10-06: 400 mg via INTRAVENOUS

## 2019-10-06 MED ORDER — FENTANYL CITRATE (PF) 100 MCG/2ML IJ SOLN
INTRAMUSCULAR | Status: AC
  Filled 2019-10-06: qty 4

## 2019-10-06 NOTE — Sedation Documentation (Signed)
100 cc NS instilled in bladder via foley. Foley clamped.

## 2019-10-06 NOTE — Procedures (Signed)
Interventional Radiology Procedure Note  Procedure: Placement of a 10F suprapubic catheter.   Complications: None  Estimated Blood Loss: None  Recommendations: - DC home - Return to IR in 4-6 weeks for catheter upsize  Signed,  Criselda Peaches, MD

## 2019-10-06 NOTE — H&P (Signed)
Chief Complaint: Patient was seen in consultation today for urinary retention/suprapublic catheter placement.  Referring Physician(s): Dahlstedt, Annie Main  Supervising Physician: Jacqulynn Cadet  Patient Status: [facility] - Out-pt  History of Present Illness: Peter Singh is a 86 y.o. male with a past medical history of hypertension, hyperlipidemia, ventricular hypertrophy, pulmonary hypertension, atrial fibrillation on chronic anticoagulation with Coumadin, CVA, dementia, diverticulosis, right renal cancer, CKD stage III, cystitis, ED, BPH, thrombocytopenia, peripheral neuropathy, OA, melanoma, and cataracts. He has urinary retention managed by Dr. Diona Fanti, who recommends suprapubic catheter placement at this time due to incomplete bladder emptying.  IR requested by Dr. Diona Fanti for possible image-guided suprapubic catheter placement.  Patient awake and alert sitting in bed. Accompanied by wife at bedside. History difficult to obtain due to history of dementia.  Last dose Coumadin 10/01/2019.   Past Medical History:  Diagnosis Date  . Adult failure to thrive    Per incoming records from [facility]  . Allergic rhinitis    Per incoming records from [facility]  . Atrial fibrillation ([place])   . BPH (benign prostatic hyperplasia)    Per incoming records from [facility]  . Cancer of kidney [facility])    Right, Per incoming records from [facility]  . Cataract    Per incoming records from [facility]  . Cerebrovascular accident, late effects    Per incoming records from [facility]  . Chronic kidney disease, stage III (moderate)    Per incoming records from [facility]  . Claudication [facility])    Per incoming records from [facility]  . Cognitive impairment    Mild, Per incoming records from [facility]  . Constipation    Per incoming  records from [facility]  . Dementia ([place])   . Diarrhea    Better off Aricept, Per incoming records from [facility]  . Diastolic dysfunction    on 2D Echo 07/2009 and 2016, Per incoming records from [facility]  . Diverticulosis    Mild, Per incoming records from [facility]  . Diverticulosis of colon (without mention of hemorrhage)   . ED (erectile dysfunction)    Per incoming records from [facility]  . History of Coumadin therapy    Per incoming records from [facility]  . History of echocardiogram 09/2014   Per incoming records from [facility]  . Hyperlipemia   . Hypertension   . Kidney mass    Per incoming records from [facility]  . Lower leg edema    Per incoming records from [facility]  . Melanoma ([place])   . Neuropathy    Per incoming records from [facility]  . OA (osteoarthritis)    Per incoming records from [facility]  . Peripheral neuropathy    Per incoming records from [facility]  . Personal history of colonic polyps 1999 & 2004   adenomatous polyps  . Pulmonary arterial hypertension ([place])    Per incoming records from [facility]  . Rosacea    Per incoming records from [facility]  . Thrombocytopenia ([place]) 2017   Per incoming records from [facility]  . UTI (urinary tract infection)    Sepsis, Per incoming records from [facility]  . Ventricular hypertrophy   . Walker as ambulation aid    Per incoming records from [employer]  Past Surgical History:  Procedure Laterality Date  . APPENDECTOMY     Per incoming records from [facility]  . CATARACT EXTRACTION     Per incoming records from [facility]  . KNEE SURGERY     right  . PILONIDAL CYST DRAINAGE     . POLYPECTOMY     Per incoming records from [facility]  . schwanoma tumor lumbar spine    . SPINE SURGERY     tumor removed  . THORACIC 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, Per incoming records from [facility]  . TONSILLECTOMY AND ADENOIDECTOMY      Allergies: Penicillins  Medications: Prior to Admission medications   Medication Sig Start Date End Date Taking? Authorizing Provider  acetaminophen (TYLENOL) 500 MG tablet Take 500 mg by mouth 2 (two) times daily. Scheduled and every 4 hours as needed (ending 05/05/2019)Document area of discomfort and effectiveness of medication.    [provider]  atorvastatin (LIPITOR) 20 MG tablet Take 20 mg by mouth every evening.     [provider]  cholecalciferol (VITAMIN D3) 25 MCG (1000 UT) tablet Take 2,000 Units by mouth daily.     [provider]  DULoxetine (CYMBALTA) 30 MG capsule Take 30 mg by mouth daily.    [provider]  hydrochlorothiazide (MICROZIDE) 12.5 MG capsule Take 12.5 mg by mouth every other day.    [provider]  methenamine (MANDELAMINE) 1 g tablet Take 1,000 mg by mouth 2 (two) times daily.    [provider]  triamcinolone lotion (KENALOG) 0.1 % Apply 1 application topically 2 (two) times daily as needed.     [provider]  warfarin (COUMADIN) 4 MG tablet Take 4 mg by mouth every other day. Give 4 mg M, W, F, Sunday    [provider]  warfarin (COUMADIN) 5 MG tablet Take 5 mg by mouth every other day. Give 5 mg Tuesday, Thursday, and Saturday    [provider]     Family History  Problem Relation Age of Onset  . Stroke Father   . CVA Father   . Tuberculosis Father   . Neuropathy Neg Hx     Social History   Socioeconomic History  . Marital status: Married    Spouse name: Not on file  . Number of children: 2  . Years of education: Not on file  . Highest education  level: Bachelor's degree (e.g., BA, AB, BS)  Occupational History  . Occupation: retired  Tobacco Use  . Smoking status: Former Smoker    Packs/day: 3.00    Types: Cigarettes    Quit date: 06/09/1975    Years since quitting: 44.3  . Smokeless tobacco: Never Used  Substance and Sexual Activity  . Alcohol use: Yes    Alcohol/week: 0.0 standard drinks    Comment: 2-3 drinks per day  . Drug use: No  . Sexual activity: Not on file  Other Topics Concern  . Not on file  Social History Narrative   Patient is married with 2 children, 3 grandchildren    Patient is retired Engineer, maintenance (IT)   Patient lives at [place] Strain:   . Difficulty of Paying Living Expenses:   Food Insecurity:   . Worried About Charity fundraiser in the Last Year:   . Arboriculturist in the Last Year:   Transportation Needs:   .  Lack of Transportation (Medical):   Marland Kitchen Lack of Transportation (Non-Medical):   Physical Activity:   . Days of Exercise per Week:   . Minutes of Exercise per Session:   Stress:   . Feeling of Stress :   Social Connections:   . Frequency of Communication with Friends and Family:   . Frequency of Social Gatherings with Friends and Family:   . Attends Religious Services:   . Active Member of Clubs or Organizations:   . Attends Archivist Meetings:   Marland Kitchen Marital Status:      Review of Systems: A 12 point ROS discussed and pertinent positives are indicated in the HPI above.  All other systems are negative.  Review of Systems  Unable to perform ROS: Dementia    Vital Signs: BP 126/79 (BP Location: Right Arm)   Pulse 89   Temp 97.7 F (36.5 C) (Oral)   Resp 18   SpO2 100%   Physical Exam Vitals and nursing note reviewed.  Constitutional:      General: He is not in acute distress.    Appearance: Normal appearance.  Cardiovascular:     Rate and Rhythm: Normal rate and regular rhythm.     Heart sounds: Normal heart  sounds. No murmur.  Pulmonary:     Effort: Pulmonary effort is normal. No respiratory distress.     Breath sounds: Normal breath sounds. No wheezing.  Skin:    General: Skin is warm and dry.  Neurological:     Mental Status: He is alert.      MD Evaluation Airway: WNL Heart: WNL Abdomen: WNL Chest/ Lungs: WNL ASA  Classification: 3 Mallampati/Airway Score: Two   Imaging: No results found.  Labs:  CBC: Recent Labs    05/01/19 1037 05/19/19 0000 06/02/19 0132 07/28/19 0000  WBC 3.5* 3.7 4.1 2.7  HGB 15.5 17.5 15.0 13.9  HCT 47.4 52 46.2 42  PLT PLATELET CLUMPS NOTED ON SMEAR, COUNT APPEARS DECREASED 139* 78* 85*    COAGS: Recent Labs    08/15/19 0000 08/22/19 0000 09/19/19 0000 09/26/19 0000  INR 3.0* 2.3* 3.0* 2.7*    BMP: Recent Labs    03/22/19 0808 03/22/19 0808 05/01/19 1037 05/19/19 0000 06/02/19 0132 07/28/19 0000  NA 140   < > 140 142 138 137  K 4.2   < > 4.4 4.1 3.8 4.0  CL 104   < > 103 107 104 101  CO2 28   < > 25 26* 23 27*  GLUCOSE 206*  --  178*  --  125*  --   BUN 17   < > 27* 27* 20 20  CALCIUM 10.3  --  10.3  --  10.1 10.2  CREATININE 1.30*  --  1.44* 1.2 1.32* 1.2  GFRNONAA 50*  --  44*  --  49*  --   GFRAA 58*  --  51*  --  57*  --    < > = values in this interval not displayed.    LIVER FUNCTION TESTS: Recent Labs    12/10/18 0000 03/22/19 0808 05/01/19 1037 05/19/19 0000  BILITOT  --  1.2 1.5*  --   AST 15 17 26 19   ALT 13 15 21 19   ALKPHOS 65 73 65 77  PROT  --  6.4* 6.8  --   ALBUMIN  --  4.1 4.0  --      Assessment and Plan:  Urinary retention. Plan for image-guided suprapubic catheter placement today  in IR. Patient is NPO. Afebrile. Last dose Coumadin 10/01/2019- ok to proceed per IR protocol. INR pending.  Risks and benefits discussed with the patient including bleeding, infection, damage to adjacent structures, and sepsis. All of the patient's questions were answered, patient is agreeable to  proceed. Consent obtained by patient's wife, Peter Singh, signed and in chart.   Thank you for this interesting consult.  I greatly enjoyed meeting Peter Singh and look forward to participating in their care.  A copy of this report was sent to the requesting provider on this date.  Electronically Signed: Earley Abide, PA-C 10/06/2019, 10:09 AM   I spent a total of 40 Minutes in face to face in clinical consultation, greater than 50% of which was counseling/coordinating care for urinary retention/suprapublic catheter placement.

## 2019-10-06 NOTE — Discharge Instructions (Signed)
Please call Interventional Radiology clinic [phone] with any questions or concerns.  You may remove your dressing and shower tomorrow.  Please change dressing daily.  Record urine output every shift.  Moderate Conscious Sedation, Adult, Care After These instructions provide you with information about caring for yourself after your procedure. Your health care provider may also give you more specific instructions. Your treatment has been planned according to current medical practices, but problems sometimes occur. Call your health care provider if you have any problems or questions after your procedure. What can I expect after the procedure? After your procedure, it is common:  To feel sleepy for several hours.  To feel clumsy and have poor balance for several hours.  To have poor judgment for several hours.  To vomit if you eat too soon. Follow these instructions at home: For at least 24 hours after the procedure:   Do not: ? Participate in activities where you could fall or become injured. ? Drive. ? Use heavy machinery. ? Drink alcohol. ? Take sleeping pills or medicines that cause drowsiness. ? Make important decisions or sign legal documents. ? Take care of children on your own.  Rest. Eating and drinking  Follow the diet recommended by your health care provider.  If you vomit: ? Drink water, juice, or soup when you can drink without vomiting. ? Make sure you have little or no nausea before eating solid foods. General instructions  Have a responsible adult stay with you until you are awake and alert.  Take over-the-counter and prescription medicines only as told by your health care provider.  If you smoke, do not smoke without supervision.  Keep all follow-up visits as told by your health care provider. This is important. Contact a health care provider if:  You keep feeling nauseous or you keep vomiting.  You feel light-headed.  You develop a  rash.  You have a fever. Get help right away if:  You have trouble breathing. This information is not intended to replace advice given to you by your health care provider. Make sure you discuss any questions you have with your health care provider. Document Revised: 06/05/2017 Document Reviewed: 10/13/2015 Elsevier Patient Education  [place] A suprapubic catheter is a flexible tube that is used to drain urine from the bladder into a collection bag outside the body. The catheter is inserted into the bladder through a small opening in the lower abdomen, above the pubic bone (suprapubic area) and a few inches below your belly button (navel). A tiny balloon filled with germ-free (sterile) water helps to keep the catheter in place. The collection bag must be emptied at least once a day and cleaned at least every other day. The collection bag can be put beside your bed at night and attached to your leg during the day. You may have a large collection bag to use at night and a smaller one to use during the day. Your suprapubic catheter may need to be changed every 4-6 weeks, or as often as recommended by your health care provider. Healing of the tract where the catheter is placed can take 6 weeks to 6 months. During that time, your health care provider may change your catheter. Once the tract is well healed, you or a caregiver will change your suprapubic catheter at home. What are the risks? This catheter is safe to use. However, problems can occur, including:  Blocked urine flow. This can occur if the  catheter stops working, or if you have a blood clot in your bladder or in the catheter.  Irritation of the skin around the catheter.  Infection. This can happen if bacteria gets into your bladder. How to care for the skin around the catheter Follow your health care provider's instructions on caring for your skin.  Use a clean washcloth and soapy water to  clean the skin around your catheter every day. Pat the area dry with a clean paper towel.  Do not pull on the catheter.  Do not use ointment or lotion on this area, unless told by your health care provider.  Check the skin around the catheter every day for signs of infection. Check for: ? Redness, swelling, or pain. ? Fluid or blood. ? Warmth. ? Pus or a bad smell. How to empty and clean the collection bag Empty the large collection bag every 8 hours. Empty the small collection bag when it is about ? full. Clean the collection bag every 2-3 days, or as often as told by your health care provider. To do this: 1. Wash your hands with soap and water. If soap and water are not available, use hand sanitizer. 2. Disconnect the bag from the catheter and immediately attach a new bag to the catheter. 3. Hold the used bag over the toilet or another container. 4. Turn the valve (spigot) at the bottom of the bag to empty the urine. Empty the used bag completely. ? Do not touch the opening of the spigot. ? Do not let the opening touch the toilet or container. 5. Close the spigot tightly when the bag is empty. 6. Clean the used bag in one of the following methods: ? According to the manufacturer's instructions. ? As told by your health care provider. 7. Let the bag dry completely. Put it in a clean plastic bag before storing it. General tips   Always wash your hands before and after caring for your catheter and collection bag. Use a mild, fragrance-free soap. If soap and water are not available, use hand sanitizer.  Clean the outside of the catheter with soap and water as often as told by your health care provider.  Always make sure there are no twists or kinks in the catheter tube.  Always make sure there are no leaks in the catheter or collection bag.  Always wear the leg bag below your knee.  Make sure the overnight drainage bag is always lower than the level of your bladder, but do not let  it touch the floor. Before you go to sleep, hang the bag inside a wastebasket that is covered by a clean plastic bag.  Drink enough fluid to keep your urine pale yellow.  Do not take baths, swim, or use a hot tub until your health care provider approves. Ask your health care provider if you may take showers. Contact a heath care provider if:  You leak urine.  You have redness, swelling, or pain around your catheter.  You have fluid or blood coming from your catheter opening.  Your catheter opening feels warm to the touch.  You have pus or a bad smell coming from your catheter opening.  You have a fever or chills.  Your urine flow slows down.  Your urine becomes cloudy or smelly. Get help right away if:  Your catheter comes out.  You have: ? Nausea. ? Back pain. ? Difficulty changing your catheter. ? Blood in your urine. ? No urine flow for 1  hour. Summary  A suprapubic catheter is a flexible tube that is used to drain urine from the bladder into a collection bag outside the body.  Your suprapubic catheter may need to be changed every 4-6 weeks, or as recommended by your health care provider.  Follow instructions on how to change the catheter and how to empty and clean the collection bag.  Always wash your hands before and after caring for your catheter and collection bag. Drink enough fluid to keep your urine pale yellow.  Get help right away if you have difficulty changing your catheter or if there is blood in your urine. This information is not intended to replace advice given to you by your health care provider. Make sure you discuss any questions you have with your health care provider. Document Revised: 10/14/2018 Document Reviewed: 07/28/2018 Elsevier Patient Education  [place].

## 2019-10-12 NOTE — Progress Notes (Signed)
Virtual Visit via Video Note   This visit type was conducted due to national recommendations for restrictions regarding the COVID-19 Pandemic (e.g. social distancing) in an effort to limit this patient's exposure and mitigate transmission in our community.  Due to his co-morbid illnesses, this patient is at least at moderate risk for complications without adequate follow up.  This format is felt to be most appropriate for this patient at this time.  All issues noted in this document were discussed and addressed.  A limited physical exam was performed with this format.  Please refer to the patient's chart for his consent to telehealth for [facility].   The patient was identified using 2 identifiers.  Date:  10/13/2019   ID:  Peter Singh, DOB 1931/07/10, MRN [id number]  Patient Location: Home Provider Location: Office  PCP:  Gayland Curry, DO  Cardiologist:  Sinclair Grooms, MD  Electrophysiologist:  None   Evaluation Performed:  Follow-Up Visit  Chief Complaint: Chronic atrial fibrillation and anticoagulation  History of Present Illness:    Peter Singh is a 86 y.o. male with chronic atrial fibrillation, chronic anticoagulation therapy, essential hypertension, chronic stable diastolic heart failure.  [name] is recumbent in bed at [employer].  We are on a video visit with [facility].  He denies orthopnea.  He is lying comfortably in his bed during this visit.  He is not short of breath.  He is repeatedly complementary to me.  He has no cardiac complaints or questions.  He has not noticed blood in his urine or stool.  His medications are given on regular schedule.  He denies lower extremity swelling.  He has not had palpitations or sensation of chest discomfort.  The patient does not have symptoms concerning for COVID-19 infection (fever, chills, cough, or new shortness of breath).    Past Medical History:  Diagnosis Date  . Adult failure to thrive    Per incoming  records from [facility]  . Allergic rhinitis    Per incoming records from [facility]  . Atrial fibrillation ([place])   . BPH (benign prostatic hyperplasia)    Per incoming records from [facility]  . Cancer of kidney [facility])    Right, Per incoming records from [facility]  . Cataract    Per incoming records from [facility]  . Cerebrovascular accident, late effects    Per incoming records from [facility]  . Chronic kidney disease, stage III (moderate)    Per incoming records from [facility]  . Claud[facility])    Per incoming records from [facility]  . Cognitive impairment    Mild, Per incoming records from [facility]  . Constipation    Per incoming records from [facility]  . Dementia ([place])   . Diarrhea    Better off Aricept, Per incoming records from [facility]  . Diastolic dysfunction    on 2D Echo 07/2009 and 2016, Per incoming records from [facility]  . Diverticulosis    Mild, Per incoming records from [facility]  . Diverticulosis of colon (without mention of hemorrhage)   . ED (erectile dysfunction)    Per incoming records from [facility]  . History of Coumadin therapy    Per incoming records from [facility]  . History of echocardiogram 09/2014   Per incoming records from [facility]  . Hyperlipemia   .  Hypertension   . Kidney mass    Per incoming records from [facility]  . Lower leg edema    Per incoming records from [facility]  . Melanoma ([place])   . Neuropathy    Per incoming records from [facility]  . OA (osteoarthritis)    Per incoming records from [facility]  . Peripheral neuropathy    Per incoming records from [facility]  . Personal history of colonic polyps 1999 & 2004   adenomatous polyps  . Pulmonary arterial hypertension ([place])    Per incoming records from [facility]  . Rosacea    Per incoming records from [facility]  . Thrombocytopenia ([place]) 2017   Per incoming records from [facility]  . UTI (urinary tract infection)    Sepsis, Per incoming records from [facility]  . Ventricular hypertrophy   . Walker as ambulation aid    Per incoming records from [employer]   Past Surgical History:  Procedure Laterality Date  . APPENDECTOMY     Per incoming records from [facility]  . CATARACT EXTRACTION     Per incoming records from [facility]  . KNEE SURGERY     right  . PILONIDAL CYST DRAINAGE    . POLYPECTOMY     Per incoming records from [facility]  . schwanoma tumor lumbar spine    . SPINE SURGERY     tumor removed  . THORACIC 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, Per incoming records from [facility]  . TONSILLECTOMY AND ADENOIDECTOMY       Current Meds  Medication Sig  . acetaminophen (TYLENOL) 500 MG tablet Take 500 mg by mouth 2 (two) times daily. Scheduled and every 4 hours as needed (ending 05/05/2019)Document area of discomfort and effectiveness of medication.  Marland Kitchen atorvastatin (LIPITOR) 20 MG tablet Take 20 mg by mouth every evening.   . cholecalciferol (VITAMIN D3) 25 MCG (1000 UT) tablet Take 2,000 Units by mouth daily.   . DULoxetine (CYMBALTA) 30 MG capsule Take 30 mg by mouth daily.  . hydrochlorothiazide (MICROZIDE) 12.5 MG capsule Take 12.5 mg by mouth every other day.  . methenamine (MANDELAMINE) 1 g tablet Take 1,000 mg by mouth 2 (two) times daily.  Marland Kitchen triamcinolone lotion (KENALOG) 0.1 % Apply 1 application topically 2 (two) times daily as needed.   . warfarin (COUMADIN) 4 MG tablet Take 4 mg by  mouth every other day. Give 4 mg M, W, F, Sunday  . warfarin (COUMADIN) 5 MG tablet Take 5 mg by mouth every other day. Give 5 mg Tuesday, Thursday, and Saturday     Allergies:   Penicillins   Social History   Tobacco Use  . Smoking status: Former Smoker    Packs/day: 3.00    Types: Cigarettes    Quit date: 06/09/1975    Years since quitting: 44.3  . Smokeless tobacco: Never Used  Substance Use Topics  . Alcohol use: Yes    Alcohol/week: 0.0 standard drinks    Comment: 2-3 drinks per day  . Drug use: No     Family Hx: The patient's family history includes CVA in his father; Stroke in his father; Tuberculosis in his father. There is no history of Neuropathy.  ROS:   Please see the history of present illness.    Appetite is stable.  Weight is not changed.  He is  down 16 pounds compared to a week ago therefore there is some error.  A month ago he weighed 254 as he does today.  The 270 pound weight was likely an error. All other systems reviewed and are negative.   Prior CV studies:   The following studies were reviewed today:  No recent or new imaging data.  Labs/Other Tests and Data Reviewed:    EKG:  An ECG dated October 2020 was personally reviewed today and demonstrated:  The tracing demonstrates atrial fibrillation, slow ventricular response, poor R wave progression V1 through V6, left axis deviation.  Recent Labs: 05/19/2019: ALT 19; TSH 0.84 10/06/2019: BUN 19; Creatinine, Ser 1.01; Hemoglobin 13.7; Platelets 109; Potassium 4.0; Sodium 137   Recent Lipid Panel Lab Results  Component Value Date/Time   CHOL 123 05/19/2019 12:00 AM   TRIG 154 05/19/2019 12:00 AM   HDL 30 (A) 05/19/2019 12:00 AM   LDLCALC 62 05/19/2019 12:00 AM    Wt Readings from Last 3 Encounters:  10/13/19 254 lb 6.4 oz (115.4 kg)  10/06/19 270 lb (122.5 kg)  09/08/19 254 lb 3.2 oz (115.3 kg)     Objective:    Vital Signs:  BP (!) 156/90   Pulse 71   Ht 6\' 6"  (1.981 m)   Wt 254 lb 6.4  oz (115.4 kg)   BMI 29.40 kg/m    VITAL SIGNS:  reviewed GEN:  no acute distress EYES:  sclerae anicteric, EOMI - Extraocular Movements Intact RESPIRATORY:  normal respiratory effort, symmetric expansion  ASSESSMENT & PLAN:    1. Atrial fibrillation, unspecified type ([place])   2. Chronic diastolic heart failure ([place])   3. Chronic anticoagulation   4. Essential hypertension   5. Encounter for therapeutic drug monitoring   6. Educated about COVID-19 virus infection    PLAN: 1. Assume continued chronic atrial for ablation with rate control as outlined above with heart rate. 2. No clinical evidence of vascular pulmonary congestion as patient is not short of breath and has no significant swelling according to him and the facility. 3. Continue Coumadin therapy with INR in the 2.0 range if possible. 4. Target blood pressure 140/80 mmHg or less.  Continue HCTZ, if systolic remains above 99991111 mmHg, would recommend consideration of amlodipine 2.5 mg to 5 mg daily. 5. COVID-19 vaccine has been received.  His facility has an excellent COVID-19 prevention protocol.   Unless there are changes in the cardiovascular status, we will have an annual visit in 12 months.  Happy to see you earlier or have a virtual visit if there are changes or concerns.  COVID-19 Education: The signs and symptoms of COVID-19 were discussed with the patient and how to seek care for testing (follow up with PCP or arrange E-visit).  The importance of social distancing was discussed today.  Time:   Today, I have spent 12 minutes with the patient with telehealth technology discussing the above problems.     Medication Adjustments/Labs and Tests Ordered: Current medicines are reviewed at length with the patient today.  Concerns regarding medicines are outlined above.   Tests Ordered: No orders of the defined types were placed in this encounter.   Medication Changes: No orders of the defined types were placed in this  encounter.   Follow Up:  In Person in 12 month(s)  Signed, Sinclair Grooms, MD  10/13/2019 1:36 PM    [facility]

## 2019-10-13 ENCOUNTER — Telehealth (INDEPENDENT_AMBULATORY_CARE_PROVIDER_SITE_OTHER): Payer: Medicare Other | Admitting: Interventional Cardiology

## 2019-10-13 ENCOUNTER — Other Ambulatory Visit: Payer: Self-pay

## 2019-10-13 ENCOUNTER — Encounter: Payer: Self-pay | Admitting: Interventional Cardiology

## 2019-10-13 VITALS — BP 156/90 | HR 71 | Ht 78.0 in | Wt 254.4 lb

## 2019-10-13 DIAGNOSIS — Z7901 Long term (current) use of anticoagulants: Secondary | ICD-10-CM

## 2019-10-13 DIAGNOSIS — I5032 Chronic diastolic (congestive) heart failure: Secondary | ICD-10-CM

## 2019-10-13 DIAGNOSIS — I4891 Unspecified atrial fibrillation: Secondary | ICD-10-CM | POA: Diagnosis not present

## 2019-10-13 DIAGNOSIS — I110 Hypertensive heart disease with heart failure: Secondary | ICD-10-CM

## 2019-10-13 DIAGNOSIS — Z87891 Personal history of nicotine dependence: Secondary | ICD-10-CM | POA: Diagnosis not present

## 2019-10-13 DIAGNOSIS — Z7189 Other specified counseling: Secondary | ICD-10-CM | POA: Diagnosis not present

## 2019-10-13 DIAGNOSIS — I4819 Other persistent atrial fibrillation: Secondary | ICD-10-CM | POA: Diagnosis not present

## 2019-10-13 DIAGNOSIS — I10 Essential (primary) hypertension: Secondary | ICD-10-CM

## 2019-10-13 LAB — POCT INR: INR: 1.6 — AB (ref 0.9–1.1)

## 2019-10-13 NOTE — Patient Instructions (Signed)

## 2019-10-17 DIAGNOSIS — R3914 Feeling of incomplete bladder emptying: Secondary | ICD-10-CM | POA: Diagnosis not present

## 2019-10-17 DIAGNOSIS — N401 Enlarged prostate with lower urinary tract symptoms: Secondary | ICD-10-CM | POA: Diagnosis not present

## 2019-10-18 ENCOUNTER — Encounter: Payer: Self-pay | Admitting: Internal Medicine

## 2019-10-18 ENCOUNTER — Non-Acute Institutional Stay (SKILLED_NURSING_FACILITY): Payer: Medicare Other | Admitting: Internal Medicine

## 2019-10-18 DIAGNOSIS — Z7901 Long term (current) use of anticoagulants: Secondary | ICD-10-CM

## 2019-10-18 DIAGNOSIS — R338 Other retention of urine: Secondary | ICD-10-CM | POA: Diagnosis not present

## 2019-10-18 DIAGNOSIS — I4811 Longstanding persistent atrial fibrillation: Secondary | ICD-10-CM | POA: Diagnosis not present

## 2019-10-18 DIAGNOSIS — N2889 Other specified disorders of kidney and ureter: Secondary | ICD-10-CM | POA: Diagnosis not present

## 2019-10-18 DIAGNOSIS — Z9359 Other cystostomy status: Secondary | ICD-10-CM

## 2019-10-18 DIAGNOSIS — I5032 Chronic diastolic (congestive) heart failure: Secondary | ICD-10-CM | POA: Diagnosis not present

## 2019-10-18 DIAGNOSIS — F0150 Vascular dementia without behavioral disturbance: Secondary | ICD-10-CM

## 2019-10-18 DIAGNOSIS — N401 Enlarged prostate with lower urinary tract symptoms: Secondary | ICD-10-CM

## 2019-10-18 NOTE — Progress Notes (Signed)
Patient ID: Peter Singh, male   DOB: 18-Dec-1931, 86 y.o.   MRN: [id number]  Location:  [facility] Room Number: [id number] Place of Service:  SNF ([phone]) Provider:   Gayland Curry, DO  Patient Care Team: Gayland Curry, DO as PCP - General (Geriatric Medicine) [name], MD as PCP - Cardiology (Cardiology)  Extended Emergency Contact Information Primary Emergency Contact: Korinek,[name] Address: [address]          [place], [place] [address] [name] of [place] Phone: [phone] Mobile Phone: [phone] Relation: Spouse Secondary Emergency Contact: Duplechain,Harris Address: [place] [address] [name] of [place] Phone: [phone] Mobile Phone: [phone] Relation: Son  Code Status:  DNR Goals of care: Advanced Directive information Advanced Directives 10/06/2019  Does Patient Have a Medical Advance Directive? Yes  Type of Paramedic of [place];Living will  Does patient want to make changes to medical advance directive? No - Patient declined  Copy of [place] in Chart? No - copy requested  Pre-existing out of facility DNR order (yellow form or pink MOST form) -     Chief Complaint  Patient presents with  . Medical Management of Chronic Issues    Routine Visit    HPI:  Pt is a 86 y.o. male with h/o htn, afib, PVD, chronic diastolic chf, vascular dementia, peripheral neuropathy, hyperlipidemia, right renal mass (palliative approach), thrombocytopenia, and recently increased difficulty with urinary retention for which he had a suprapubic catheter inserted seen today for medical management of chronic diseases.  He is doing well.  Nursing reports only a minor concern at suprapubic cath insertion site--some redness.  He uses the restroom himself and pulls his pants up over the area.  When nurse and I evaluated the area, there was some  nontender erythema and we discovered that all but one of the sutures holding the catheter at the opening had been detached.  The securing device had kept it in place; however, and there were no complications.  He denied pain.  The device is working well.  He has a f/u with urology for the first change and upgrade of the catheter.  [name] walks with his rolling walker with skis and caregiver, Olin Hauser, was taking him to see the ducks in the pond.    He had no complaints.     Past Medical History:  Diagnosis Date  . Adult failure to thrive    Per incoming records from [facility]  . Allergic rhinitis    Per incoming records from [facility]  . Atrial fibrillation ([place])   . BPH (benign prostatic hyperplasia)    Per incoming records from [facility]  . Cancer of kidney [facility])    Right, Per incoming records from [facility]  . Cataract    Per incoming records from [facility]  . Cerebrovascular accident, late effects    Per incoming records from [facility]  . Chronic kidney disease, stage III (moderate)    Per incoming records from [facility]  . [facility])    Per incoming records from [facility]  . Cognitive impairment    Mild, Per incoming records from [facility]  . Constipation    Per incoming records from [facility]  . Dementia ([place])   . Diarrhea    Better off  Aricept, Per incoming records from [facility]  . Diastolic dysfunction    on 2D Echo 07/2009 and 2016, Per incoming records from [facility]  . Diverticulosis    Mild, Per incoming records from [facility]  . Diverticulosis of colon (without mention of hemorrhage)   . ED (erectile dysfunction)    Per incoming records from [facility]  . History of Coumadin therapy    Per incoming records from  [facility]  . History of echocardiogram 09/2014   Per incoming records from [facility]  . Hyperlipemia   . Hypertension   . Kidney mass    Per incoming records from [facility]  . Lower leg edema    Per incoming records from [facility]  . Melanoma ([place])   . Neuropathy    Per incoming records from [facility]  . OA (osteoarthritis)    Per incoming records from [facility]  . Peripheral neuropathy    Per incoming records from [facility]  . Personal history of colonic polyps 1999 & 2004   adenomatous polyps  . Pulmonary arterial hypertension ([place])    Per incoming records from [facility]  . Rosacea    Per incoming records from [facility]  . Thrombocytopenia ([place]) 2017   Per incoming records from [facility]  . UTI (urinary tract infection)    Sepsis, Per incoming records from [facility]  . Ventricular hypertrophy   . Walker as ambulation aid    Per incoming records from [employer] [facility]   Past Surgical History:  Procedure Laterality Date  . APPENDECTOMY     Per incoming records from [facility]  . CATARACT EXTRACTION     Per incoming records from [facility]  . KNEE SURGERY     right  . PILONIDAL CYST DRAINAGE    . POLYPECTOMY     Per incoming records from [facility]  . schwanoma tumor lumbar spine    . SPINE SURGERY     tumor removed  . THORACIC 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, Per incoming records from [facility]  . TONSILLECTOMY AND ADENOIDECTOMY      Allergies  Allergen Reactions  . Penicillins Rash    Has patient had a PCN reaction causing immediate rash, facial/tongue/throat swelling, SOB or lightheadedness with hypotension: unknown Has patient had a PCN reaction causing severe  rash involving mucus membranes or skin necrosis: unknown Has patient had a PCN reaction that required hospitalization : unknown Has patient had a PCN reaction occurring within the last 10 years: no If all of the above answers are "NO", then may proceed with Cephalosporin use.     Outpatient Encounter Medications as of 10/18/2019  Medication Sig  . acetaminophen (TYLENOL) 500 MG tablet Take 500 mg by mouth 2 (two) times daily. Scheduled and every 4 hours as needed (ending 05/05/2019)Document area of discomfort and effectiveness of medication.  Marland Kitchen atorvastatin (LIPITOR) 20 MG tablet Take 20 mg by mouth every evening.   . cholecalciferol (VITAMIN D3) 25 MCG (1000 UT) tablet Take 2,000 Units by mouth daily.   . DULoxetine (CYMBALTA) 30 MG capsule Take 30 mg by mouth daily.  . hydrochlorothiazide (MICROZIDE) 12.5 MG capsule Take 12.5 mg by mouth every other day.  . methenamine (MANDELAMINE) 1 g tablet Take 1,000 mg by mouth 2 (two) times daily.  Marland Kitchen triamcinolone  lotion (KENALOG) 0.1 % Apply 1 application topically 2 (two) times daily as needed.   . warfarin (COUMADIN) 4 MG tablet Give 4 mg Monday, Wednesday, Friday, Sunday  . warfarin (COUMADIN) 5 MG tablet Give 5 mg Tuesday, Thursday, and Saturday    No facility-administered encounter medications on file as of 10/18/2019.    Review of Systems  Constitutional: Negative for activity change, appetite change, chills and fever.  HENT: Negative for congestion.   Eyes: Negative for visual disturbance.  Respiratory: Negative for cough and shortness of breath.   Gastrointestinal: Negative for abdominal pain, constipation, diarrhea, nausea and vomiting.  Genitourinary: Negative for dysuria.       Suprapubic cath in place with erythema around it, no tenderness, all but one suture pulled out, but remains overall secure--nurse adjusted locking device to prevent it from pulling out if last one breaks loose, dark yellow urine in bag  Musculoskeletal: Positive  for gait problem. Negative for back pain.       Shuffling gait  Skin:       Brownish irregular plaque of right cheek  Neurological: Negative for dizziness and weakness.  Psychiatric/Behavioral: Positive for confusion. Negative for agitation, hallucinations and sleep disturbance. The patient is not nervous/anxious.        Always pleasant per nursing and he was very polite for his visit    Immunization History  Administered Date(s) Administered  . Influenza-Unspecified 04/17/2014, 05/01/2016, 04/15/2019  . Moderna SARS-COVID-2 Vaccination 07/12/2019, 08/09/2019  . Pneumococcal Conjugate-13 04/20/2014  . Pneumococcal Polysaccharide-23 07/08/2003, 03/31/2011  . Pneumococcal-Unspecified 01/26/2004  . Td 08/23/2007  . Tdap 01/07/2012  . Zoster 07/17/2005  . Zoster Recombinat (Shingrix) 10/05/2017, 12/09/2017   Pertinent  Health Maintenance Due  Topic Date Due  . INFLUENZA VACCINE  02/05/2020  . PNA vac Low Risk Adult  Completed   Fall Risk  06/23/2019 05/07/2019 05/03/2019  Falls in the past year? 1 - -  Number falls in past yr: 1 - -  Injury with Fall? 1 - -  Risk for fall due to : History of fall(s);Impaired balance/gait;Impaired mobility History of fall(s);Impaired balance/gait;Impaired mobility;Mental status change;Medication side effect History of fall(s);Impaired balance/gait;Impaired mobility;Medication side effect;Mental status change  Follow up Falls evaluation completed Falls evaluation completed;Education provided;Falls prevention discussed Falls evaluation completed;Education provided;Falls prevention discussed  Comment - [facility] all addressed at [place]:    Vitals:   10/18/19 1021  BP: (!) 156/96  Pulse: 71  Resp: (!) 21  Temp: (!) 97.5 F (36.4 C)  TempSrc: Oral  SpO2: 96%  Weight: 254 lb (115.2 kg)  Height: 6\' 7"  (2.007 m)   Body mass index is 28.61 kg/m. Physical Exam Vitals reviewed.  Constitutional:       General: He is not in acute distress.    Appearance: Normal appearance. He is not toxic-appearing.  HENT:     Head: Normocephalic and atraumatic.  Cardiovascular:     Rate and Rhythm: Rhythm irregular.  Pulmonary:     Effort: Pulmonary effort is normal.     Breath sounds: Normal breath sounds. No rales.  Abdominal:     General: Bowel sounds are normal.     Palpations: Abdomen is soft.  Genitourinary:    Comments: See ROS above Musculoskeletal:        General: Normal range of motion.     Comments: Shuffling gait, wearing compression socks with improvement in edema of feet  Skin:    Comments: See ROS above  Neurological:     General: No focal deficit present.     Mental Status: He is alert.     Cranial Nerves: No cranial nerve deficit.     Motor: No weakness.     Gait: Gait abnormal.  Psychiatric:        Mood and Affect: Mood normal.     Comments: Pleasant and sociable     Labs reviewed: Recent Labs    05/01/19 1037 05/19/19 0000 06/02/19 0132 07/28/19 0000 10/06/19 1010  NA 140   < > 138 137 137  K 4.4   < > 3.8 4.0 4.0  CL 103   < > 104 101 103  CO2 25   < > 23 27* 25  GLUCOSE 178*  --  125*  --  104*  BUN 27*   < > 20 20 19   CREATININE 1.44*   < > 1.32* 1.2 1.01  CALCIUM 10.3  --  10.1 10.2 9.6   < > = values in this interval not displayed.   Recent Labs    03/22/19 0808 05/01/19 1037 05/19/19 0000  AST 17 26 19   ALT 15 21 19   ALKPHOS 73 65 77  BILITOT 1.2 1.5*  --   PROT 6.4* 6.8  --   ALBUMIN 4.1 4.0  --    Recent Labs    05/01/19 1037 05/19/19 0000 06/02/19 0132 07/28/19 0000 10/06/19 1010  WBC 3.5*   < > 4.1 2.7 2.4*  NEUTROABS 2.4  --  3.0 2 1.4*  HGB 15.5   < > 15.0 13.9 13.7  HCT 47.4   < > 46.2 42 43.9  MCV 95.4  --  93.9  --  94.0  PLT PLATELET CLUMPS NOTED ON SMEAR, COUNT APPEARS DECREASED   < > 78* 85* 109*   < > = values in this interval not displayed.   Lab Results  Component Value Date   TSH 0.84 05/19/2019   Lab Results    Component Value Date   HGBA1C 6 08/22/2019   Lab Results  Component Value Date   CHOL 123 05/19/2019   HDL 30 (A) 05/19/2019   LDLCALC 62 05/19/2019   TRIG 154 05/19/2019    Significant Diagnostic Results in last 30 days:  CT IMAGE GUIDED FLUID DRAIN BY CATHETER  Result Date: 10/06/2019 INDICATION: 86 year old male with urinary retention and chronic indwelling Foley catheter. He presents for suprapubic catheter placement. EXAM: Suprapubic catheter placement (CT-guided drain) COMPARISON:  None. MEDICATIONS: None ANESTHESIA/SEDATION: Fentanyl 100 mcg IV; Versed 2 mg IV Moderate Sedation Time:  26 minutes The patient was continuously monitored during the procedure by the interventional radiology nurse under my direct supervision. CONTRAST:  None FLUOROSCOPY TIME:  None COMPLICATIONS: None immediate. PROCEDURE: Informed written consent was obtained from the patient after a thorough discussion of the procedural risks, benefits and alternatives. All questions were addressed. A timeout was performed prior to the initiation of the procedure. A planning axial CT scan was performed. The bladder was identified. The overlying skin was marked and then sterilely prepped and draped in the standard fashion using chlorhexidine skin prep. Local anesthesia was attained by infiltration with 1% lidocaine. A small dermatotomy was made. Under intermittent CT guidance, an 18 gauge trocar needle was advanced through the low anterior abdominal wall and into the bladder. A 0.035 guidewire was then coiled within the bladder. The trocar needle was removed. The skin tract was dilated to 12 [place]. A Cook 12 [place] all-purpose drainage catheter was  advanced over the wire and formed in the urinary bladder. The catheter was connected to gravity bag drainage and secured to the skin with an 0 Prolene suture. Post placement CT imaging demonstrates a well-positioned suprapubic catheter in the bladder. IMPRESSION: CT-guided placement of a  12 French suprapubic catheter. PLAN: Patient should return Interventional Radiology in 6 weeks for tube exchange and up size. Signed, Criselda Peaches, MD, [facility] Electronically Signed   By: Jacqulynn Cadet M.D.   On: 10/06/2019 14:38    Assessment/Plan 1. Urinary retention due to benign prostatic hyperplasia -just s/p suprapubic catheter with Dr. Consepcion Hearing well outside of sutures coming undone--nursing has secured device well since pt pulls pants up himself when toileting -some erythema at base, but suspect more traumatic not infectious and would prefer not to give abx again with multiple rounds historically for "UTIs"  2. Suprapubic catheter [facility]) -now in place, keep f/u for first change and upsize with Dr. Diona Fanti as planned  3. Longstanding persistent atrial fibrillation (HCC) -rate is controlled w/o meds and he's asymptomatic, on warfarin   4. Chronic anticoagulation -cont warfarin regimen of 4mg  alternating with 5mg  for goal INR 2-3 for his afib--CHA2DS2vasc of 6 -I was unable to find a reason (outside of lack of reversibility in event of fall with head trauma) why he's not on a NOAC instead   5. Vascular dementia without behavioral disturbance (HCC) -moderate stage, remains social and pleasant, able to take himself to the restroom, help with his bathing and dressing with cues and feed himself, participates in activities -last MMSE was 21/30 in the fall, failed clock -not on meds for cognition, but maintain bp, lipid control   6. Chronic diastolic heart failure (HCC) -clinically euvolemic with current regimen, cont same and monitor  7. Renal mass, right -to be managed palliatively, present for several years now  Family/ staff Communication: discussed with snf nurse  Labs/tests ordered:  No new  Angelys Yetman L. Lisa-Marie Rueger, D.O. [facility] [address], [place] [address] Cell Phone (Mon-Fri 8am-5pm):  [phone] On Call:  [phone] & follow prompts after 5pm & weekends Office Phone:  [phone] Office Fax:  [phone]

## 2019-10-20 ENCOUNTER — Encounter: Payer: Self-pay | Admitting: Internal Medicine

## 2019-10-20 ENCOUNTER — Encounter: Payer: Self-pay | Admitting: Adult Health

## 2019-10-20 ENCOUNTER — Non-Acute Institutional Stay (SKILLED_NURSING_FACILITY): Payer: Medicare Other | Admitting: Adult Health

## 2019-10-20 DIAGNOSIS — I4891 Unspecified atrial fibrillation: Secondary | ICD-10-CM | POA: Diagnosis not present

## 2019-10-20 DIAGNOSIS — I4811 Longstanding persistent atrial fibrillation: Secondary | ICD-10-CM

## 2019-10-20 DIAGNOSIS — Z7901 Long term (current) use of anticoagulants: Secondary | ICD-10-CM | POA: Diagnosis not present

## 2019-10-20 LAB — POCT INR: INR: 2.2 — AB (ref 0.9–1.1)

## 2019-10-20 LAB — PROTIME-INR: Protime: 24.4 — AB (ref 10.0–13.8)

## 2019-10-20 NOTE — Progress Notes (Signed)
Location:  Occupational psychologist of Service:  SNF (31) Provider:   Cindi Carbon, ANP [place] [phone]   Gayland Curry, DO  Patient Care Team: Gayland Curry, DO as PCP - General (Geriatric Medicine) [name], MD as PCP - Cardiology (Cardiology)  Extended Emergency Contact Information Primary Emergency Contact: Bales,[name] Address: [address]          [place], [place] [address] [name] of [place] Phone: [phone] Mobile Phone: [phone] Relation: Spouse Secondary Emergency Contact: Nunes,Mihailo Address: [place] [address] [name] of [place] Phone: [phone] Mobile Phone: [phone] Relation: Son  Code Status:  DNR Goals of care: Advanced Directive information Advanced Directives 10/06/2019  Does Patient Have a Medical Advance Directive? Yes  Type of Paramedic of [place];Living will  Does patient want to make changes to medical advance directive? No - Patient declined  Copy of [place] in Chart? No - copy requested  Pre-existing out of facility DNR order (yellow form or pink MOST form) -     Chief Complaint  Patient presents with  . Acute Visit    coumadin management    HPI:  Pt is a 86 y.o. male seen today for an acute visit for coumadin management. He has a hx of longstanding afib and is taking coumadin. No issues with excessive bleeding or bruising are reported. Coumadin had been previously on hold due to s/p cath insertion but is now back at previous alternating dosing as below. Lab Results  Component Value Date   INR 2.2 (A) 10/20/2019   INR 1.6 (A) 10/13/2019   INR 1.3 (H) 10/06/2019   PROTIME 24.4 (A) 10/20/2019   PROTIME 29.0 (A) 09/26/2019   PROTIME 30.8 (A) 09/19/2019     Past Medical History:  Diagnosis Date  . Adult failure to thrive    Per incoming records [facility]  . Allergic rhinitis    Per incoming records from [facility]  . Atrial fibrillation ([place])   . BPH (benign prostatic hyperplasia)    Per incoming records from [facility]  . Cancer of kidney [facility])    Right, Per incoming records from [facility]  . Cataract    Per incoming records from [facility]  . Cerebrovascular accident, late effects    Per incoming records from [facility]  . Chronic kidney disease, stage III (moderate)    Per incoming records from [facility]  . Claudication [facility])    Per incoming records from [facility]  . Cognitive impairment    Mild, Per incoming records from [facility]  . Constipation    Per incoming records from [facility]  . Dementia ([place])   . Diarrhea    Better off Aricept, Per incoming records from [facility]  . Diastolic dysfunction    on 2D Echo 07/2009 and 2016, Per incoming records from [facility]  . Diverticulosis    Mild, Per incoming records from [facility]  . Diverticulosis of colon (without mention of hemorrhage)   . ED (erectile dysfunction)    Per incoming records from [facility]  . History of Coumadin therapy    Per incoming records from [facility]  . History of echocardiogram 09/2014   Per incoming records from [facility]  . Hyperlipemia   . Hypertension   . Kidney mass    Per incoming records from [facility]  . Lower leg edema    Per incoming records from [facility]  . Melanoma ([place])   . Neuropathy    Per incoming records from [facility]  . OA (osteoarthritis)    Per incoming records from [facility]  . Peripheral neuropathy    Per incoming records from [facility]  . Personal history of  colonic polyps 1999 & 2004   adenomatous polyps  . Pulmonary arterial hypertension ([place])    Per incoming records from [facility]  . Rosacea    Per incoming records from [facility]  . Thrombocytopenia ([place]) 2017   Per incoming records from [facility]  . UTI (urinary tract infection)    Sepsis, Per incoming records from [facility]  . Ventricular hypertrophy   . Walker as ambulation aid    Per incoming records from [employer]   Past Surgical History:  Procedure Laterality Date  . APPENDECTOMY     Per incoming records from [facility]  . CATARACT EXTRACTION     Per incoming records from [facility]  . KNEE SURGERY     right  . PILONIDAL CYST DRAINAGE    . POLYPECTOMY     Per incoming records from [facility]  . schwanoma tumor lumbar spine    . SPINE SURGERY     tumor removed  . THORACIC 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, Per incoming records from [facility]  . TONSILLECTOMY AND ADENOIDECTOMY      Allergies  Allergen Reactions  . Penicillins Rash    Has patient had a PCN reaction causing immediate rash, facial/tongue/throat swelling, SOB or lightheadedness with hypotension: unknown Has patient had a PCN reaction causing severe rash involving mucus membranes or skin necrosis: unknown Has patient had a PCN reaction that required hospitalization : unknown Has patient had a PCN reaction occurring within the last 10 years: no If all of the above answers are "NO", then may proceed with Cephalosporin use.     Outpatient Encounter Medications as of 10/20/2019  Medication Sig  . acetaminophen (TYLENOL) 500 MG tablet Take 500 mg by mouth 2 (two) times daily. Scheduled and every 4 hours as needed (ending 05/05/2019)Document area of discomfort and effectiveness of medication.  Marland Kitchen atorvastatin (LIPITOR) 20 MG tablet  Take 20 mg by mouth every evening.   . cholecalciferol (VITAMIN D3) 25 MCG (1000 UT) tablet Take 2,000 Units by mouth daily.   . DULoxetine (CYMBALTA) 30 MG capsule Take 30 mg by mouth daily.  . hydrochlorothiazide (MICROZIDE) 12.5 MG capsule Take 12.5 mg by mouth every other day.  . methenamine (MANDELAMINE) 1 g tablet Take 1,000 mg by mouth 2 (two) times daily.  Marland Kitchen triamcinolone lotion (KENALOG) 0.1 % Apply 1 application topically 2 (two) times daily as needed.   . warfarin (COUMADIN) 4 MG tablet Give 4 mg Monday, Wednesday, Friday, Sunday  . warfarin (COUMADIN) 5 MG tablet Give 5 mg Tuesday, Thursday, and Saturday    No facility-administered encounter medications on file as of 10/20/2019.    Review of Systems  Musculoskeletal: Positive for gait problem.  Hematological: Does not bruise/bleed easily.  Psychiatric/Behavioral: Positive for confusion.    Immunization History  Administered Date(s) Administered  . Influenza-Unspecified 04/17/2014, 05/01/2016, 04/15/2019  . Moderna SARS-COVID-2  Vaccination 07/12/2019, 08/09/2019  . Pneumococcal Conjugate-13 04/20/2014  . Pneumococcal Polysaccharide-23 07/08/2003, 03/31/2011  . Pneumococcal-Unspecified 01/26/2004  . Td 08/23/2007  . Tdap 01/07/2012  . Zoster 07/17/2005  . Zoster Recombinat (Shingrix) 10/05/2017, 12/09/2017   Pertinent  Health Maintenance Due  Topic Date Due  . INFLUENZA VACCINE  02/05/2020  . PNA vac Low Risk Adult  Completed   Fall Risk  06/23/2019 05/07/2019 05/03/2019  Falls in the past year? 1 - -  Number falls in past yr: 1 - -  Injury with Fall? 1 - -  Risk for fall due to : History of fall(s);Impaired balance/gait;Impaired mobility History of fall(s);Impaired balance/gait;Impaired mobility;Mental status change;Medication side effect History of fall(s);Impaired balance/gait;Impaired mobility;Medication side effect;Mental status change  Follow up Falls evaluation completed Falls evaluation completed;Education  provided;Falls prevention discussed Falls evaluation completed;Education provided;Falls prevention discussed  [facility] - [facility] all [facility] at [place]:    There were no vitals filed for this visit. There is no height or weight on file to calculate BMI. Physical Exam Vitals reviewed.  Neurological:     Mental Status: He is alert.     Labs reviewed: Recent Labs    05/01/19 1037 05/19/19 0000 06/02/19 0132 07/28/19 0000 10/06/19 1010  NA 140   < > 138 137 137  K 4.4   < > 3.8 4.0 4.0  CL 103   < > 104 101 103  CO2 25   < > 23 27* 25  GLUCOSE 178*  --  125*  --  104*  BUN 27*   < > 20 20 19   CREATININE 1.44*   < > 1.32* 1.2 1.01  CALCIUM 10.3  --  10.1 10.2 9.6   < > = values in this interval not displayed.   Recent Labs    03/22/19 0808 05/01/19 1037 05/19/19 0000  AST 17 26 19   ALT 15 21 19   ALKPHOS 73 65 77  BILITOT 1.2 1.5*  --   PROT 6.4* 6.8  --   ALBUMIN 4.1 4.0  --    Recent Labs    05/01/19 1037 05/19/19 0000 06/02/19 0132 07/28/19 0000 10/06/19 1010  WBC 3.5*   < > 4.1 2.7 2.4*  NEUTROABS 2.4  --  3.0 2 1.4*  HGB 15.5   < > 15.0 13.9 13.7  HCT 47.4   < > 46.2 42 43.9  MCV 95.4  --  93.9  --  94.0  PLT PLATELET CLUMPS NOTED ON SMEAR, COUNT APPEARS DECREASED   < > 78* 85* 109*   < > = values in this interval not displayed.   Lab Results  Component Value Date   TSH 0.84 05/19/2019   Lab Results  Component Value Date   HGBA1C 6 08/22/2019   Lab Results  Component Value Date   CHOL 123 05/19/2019   HDL 30 (A) 05/19/2019   LDLCALC 62 05/19/2019   TRIG 154 05/19/2019    Significant Diagnostic Results in last 30 days:  CT IMAGE GUIDED FLUID DRAIN BY CATHETER  Result Date: 10/06/2019 INDICATION: 86 year old male with urinary retention and chronic indwelling Foley catheter. He presents for suprapubic catheter placement. EXAM: Suprapubic catheter placement (CT-guided drain) COMPARISON:  None.  MEDICATIONS: None ANESTHESIA/SEDATION: Fentanyl 100 mcg IV; Versed 2 mg IV Moderate Sedation Time:  26 minutes The patient was continuously monitored during the procedure by the interventional radiology nurse under my direct supervision. CONTRAST:  None FLUOROSCOPY TIME:  None COMPLICATIONS: None  immediate. PROCEDURE: Informed written consent was obtained from the patient after a thorough discussion of the procedural risks, benefits and alternatives. All questions were addressed. A timeout was performed prior to the initiation of the procedure. A planning axial CT scan was performed. The bladder was identified. The overlying skin was marked and then sterilely prepped and draped in the standard fashion using chlorhexidine skin prep. Local anesthesia was attained by infiltration with 1% lidocaine. A small dermatotomy was made. Under intermittent CT guidance, an 18 gauge trocar needle was advanced through the low anterior abdominal wall and into the bladder. A 0.035 guidewire was then coiled within the bladder. The trocar needle was removed. The skin tract was dilated to 12 [place]. A Cook 12 [place] all-purpose drainage catheter was advanced over the wire and formed in the urinary bladder. The catheter was connected to gravity bag drainage and secured to the skin with an 0 Prolene suture. Post placement CT imaging demonstrates a well-positioned suprapubic catheter in the bladder. IMPRESSION: CT-guided placement of a 12 French suprapubic catheter. PLAN: Patient should return Interventional Radiology in 6 weeks for tube exchange and up size. Signed, Criselda Peaches, MD, [place] [facility] Radiology Electronically Signed   By: Jacqulynn Cadet M.D.   On: 10/06/2019 14:38    Assessment/Plan 1. Chronic anticoagulation Continue Coumadin 4 mg M, W, F, Sunday and 5 mg Tuesday, Thurs, and Saturday  2. Longstanding persistent atrial fibrillation (HCC) CHA2DS2-VASc score  6 Rate is controlled, coumadin is for CVA risk reduction     Family/ staff Communication: staff  Labs/tests ordered:  INR in 2 weeks

## 2019-10-21 ENCOUNTER — Other Ambulatory Visit (HOSPITAL_COMMUNITY): Payer: Self-pay | Admitting: Interventional Radiology

## 2019-10-21 DIAGNOSIS — R102 Pelvic and perineal pain: Secondary | ICD-10-CM

## 2019-10-30 DIAGNOSIS — Z9359 Other cystostomy status: Secondary | ICD-10-CM | POA: Insufficient documentation

## 2019-11-03 ENCOUNTER — Encounter: Payer: Self-pay | Admitting: Adult Health

## 2019-11-03 ENCOUNTER — Non-Acute Institutional Stay (SKILLED_NURSING_FACILITY): Payer: Medicare Other | Admitting: Adult Health

## 2019-11-03 DIAGNOSIS — I4811 Longstanding persistent atrial fibrillation: Secondary | ICD-10-CM | POA: Diagnosis not present

## 2019-11-03 DIAGNOSIS — Z7901 Long term (current) use of anticoagulants: Secondary | ICD-10-CM

## 2019-11-03 DIAGNOSIS — I4821 Permanent atrial fibrillation: Secondary | ICD-10-CM | POA: Diagnosis not present

## 2019-11-03 DIAGNOSIS — I4891 Unspecified atrial fibrillation: Secondary | ICD-10-CM | POA: Diagnosis not present

## 2019-11-03 LAB — PROTIME-INR: Protime: 27.8 — AB (ref 10.0–13.8)

## 2019-11-03 LAB — POCT INR: INR: 2.6 — AB (ref 0.9–1.1)

## 2019-11-03 NOTE — Progress Notes (Signed)
Location:  Occupational psychologist of Service:  SNF (31) Provider:   Cindi Carbon, ANP [place] [phone]   Gayland Curry, DO  Patient Care Team: Gayland Curry, DO as PCP - General (Geriatric Medicine) [name], MD as PCP - Cardiology (Cardiology)  Extended Emergency Contact Information Primary Emergency Contact: Babic,[name] Address: [address]          [place], [place] [address] [name] of [place] Phone: [phone] Mobile Phone: [phone] Relation: Spouse Secondary Emergency Contact: Taher,Raquan Address: [place] [address] [name] of [place] Phone: [phone] Mobile Phone: [phone] Relation: Son  Code Status:  DNR Goals of care: Advanced Directive information Advanced Directives 10/06/2019  Does Patient Have a Medical Advance Directive? Yes  Type of Paramedic of [place];Living will  Does patient want to make changes to medical advance directive? No - Patient declined  Copy of [place] in Chart? No - copy requested  Pre-existing out of facility DNR order (yellow form or pink MOST form) -     Chief Complaint  Patient presents with  . Acute Visit    coumadin management    HPI:  Pt is a 86 y.o. male seen today for an acute visit for coumadin management. He has a hx of longstanding afib and is taking coumadin. No issues with excessive bleeding or bruising are reported. He has a suprapubic cath in place at this time with no current issues.  Lab Results  Component Value Date   INR 2.6 (A) 11/03/2019   INR 2.2 (A) 10/20/2019   INR 1.6 (A) 10/13/2019   PROTIME 27.8 (A) 11/03/2019   PROTIME 24.4 (A) 10/20/2019   PROTIME 29.0 (A) 09/26/2019     Past Medical History:  Diagnosis Date  . Adult failure to thrive    Per incoming records from [facility]  . Allergic rhinitis    Per incoming  records from [facility]  . Atrial fibrillation ([place])   . BPH (benign prostatic hyperplasia)    Per incoming records from [facility]  . Cancer of kidney [facility])    Right, Per incoming records from [facility]  . Cataract    Per incoming records from [facility]  . Cerebrovascular accident, late effects    Per incoming records from [facility]  . Chronic kidney disease, stage III (moderate)    Per incoming records from [facility]  . Claudication [facility])    Per incoming records from [facility]  . Cognitive impairment    Mild, Per incoming records from [facility]  . Constipation    Per incoming records from [facility]  . Dementia ([place])   . Diarrhea    Better off Aricept, Per incoming records from [facility]  . Diastolic dysfunction    on 2D Echo 07/2009 and 2016, Per incoming records from [facility]  . Diverticulosis    Mild, Per incoming records from [facility]  . Diverticulosis of colon (without mention of hemorrhage)   . ED (erectile dysfunction)    Per incoming records from [facility]  . History of Coumadin therapy    Per incoming records from [facility]  . History of echocardiogram 09/2014   Per incoming records from [facility]  . Hyperlipemia   .  Hypertension   . Kidney mass    Per incoming records from [facility]  . Lower leg edema    Per incoming records from [facility]  . Melanoma ([place])   . Neuropathy    Per incoming records from [facility]  . OA (osteoarthritis)    Per incoming records from [facility]  . Peripheral neuropathy    Per incoming records from [facility]  . Personal history of colonic polyps 1999 & 2004   adenomatous polyps  .  Pulmonary arterial hypertension ([place])    Per incoming records from [facility]  . Rosacea    Per incoming records from [facility]  . Thrombocytopenia ([place]) 2017   Per incoming records from [facility]  . UTI (urinary tract infection)    Sepsis, Per incoming records from [facility]  . Ventricular hypertrophy   . Walker as ambulation aid    Per incoming records from [employer]   Past Surgical History:  Procedure Laterality Date  . APPENDECTOMY     Per incoming records from [facility]  . CATARACT EXTRACTION     Per incoming records from [facility]  . KNEE SURGERY     right  . PILONIDAL CYST DRAINAGE    . POLYPECTOMY     Per incoming records from [facility]  . schwanoma tumor lumbar spine    . SPINE SURGERY     tumor removed  . THORACIC 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, Per incoming records from [facility]  . TONSILLECTOMY AND ADENOIDECTOMY      Allergies  Allergen Reactions  . Penicillins Rash    Has patient had a PCN reaction causing immediate rash, facial/tongue/throat swelling, SOB or lightheadedness with hypotension: unknown Has patient had a PCN reaction causing severe rash involving mucus membranes or skin necrosis: unknown Has patient had a PCN reaction that required hospitalization : unknown Has patient had a PCN reaction occurring within the last 10 years: no If all of the above answers are "NO", then may proceed with Cephalosporin use.     Outpatient Encounter Medications as of 11/03/2019  Medication Sig  . acetaminophen (TYLENOL) 500 MG tablet Take 500 mg by mouth 2 (two) times daily. Scheduled and every 4 hours as needed (ending 05/05/2019)Document area of discomfort and effectiveness of medication.  Marland Kitchen atorvastatin (LIPITOR) 20 MG tablet Take 20 mg by mouth every evening.   .  cholecalciferol (VITAMIN D3) 25 MCG (1000 UT) tablet Take 2,000 Units by mouth daily.   . DULoxetine (CYMBALTA) 30 MG capsule Take 30 mg by mouth daily.  . hydrochlorothiazide (MICROZIDE) 12.5 MG capsule Take 12.5 mg by mouth every other day.  . methenamine (MANDELAMINE) 1 g tablet Take 1,000 mg by mouth 2 (two) times daily.  Marland Kitchen triamcinolone lotion (KENALOG) 0.1 % Apply 1 application topically 2 (two) times daily as needed.   . warfarin (COUMADIN) 4 MG tablet Give 4 mg Monday, Wednesday, Friday, Sunday  . warfarin (COUMADIN) 5 MG tablet Give 5 mg Tuesday, Thursday, and Saturday    No facility-administered encounter medications on file as of 11/03/2019.    Review of Systems  Musculoskeletal: Positive for gait problem.  Hematological: Does not bruise/bleed easily.  Psychiatric/Behavioral: Positive for confusion.    Immunization History  Administered Date(s) Administered  . Influenza-Unspecified 04/17/2014, 05/01/2016, 04/15/2019  . Moderna SARS-COVID-2 Vaccination 07/12/2019, 08/09/2019  . Pneumococcal Conjugate-13  04/20/2014  . Pneumococcal Polysaccharide-23 07/08/2003, 03/31/2011  . Pneumococcal-Unspecified 01/26/2004  . Td 08/23/2007  . Tdap 01/07/2012  . Zoster 07/17/2005  . Zoster Recombinat (Shingrix) 10/05/2017, 12/09/2017   Pertinent  Health Maintenance Due  Topic Date Due  . INFLUENZA VACCINE  02/05/2020  . PNA vac Low Risk Adult  Completed   Fall Risk  10/30/2019 06/23/2019 05/07/2019 05/03/2019  Falls in the past year? - 1 - -  Number falls in past yr: - 1 - -  Injury with Fall? - 1 - -  Risk for fall due to : History of fall(s);Impaired balance/gait;Impaired mobility;Mental status change History of fall(s);Impaired balance/gait;Impaired mobility History of fall(s);Impaired balance/gait;Impaired mobility;Mental status change;Medication side effect History of fall(s);Impaired balance/gait;Impaired mobility;Medication side effect;Mental status change  Follow up Falls  evaluation completed;Education provided;Falls prevention discussed Falls evaluation completed Falls evaluation completed;Education provided;Falls prevention discussed Falls evaluation completed;Education provided;Falls prevention discussed  Comment - [facility] at [place]:    There were no vitals filed for this visit. There is no height or weight on file to calculate BMI. Physical Exam Vitals reviewed.  Neurological:     Mental Status: He is alert.     Labs reviewed: Recent Labs    05/01/19 1037 05/19/19 0000 06/02/19 0132 07/28/19 0000 10/06/19 1010  NA 140   < > 138 137 137  K 4.4   < > 3.8 4.0 4.0  CL 103   < > 104 101 103  CO2 25   < > 23 27* 25  GLUCOSE 178*  --  125*  --  104*  BUN 27*   < > 20 20 19   CREATININE 1.44*   < > 1.32* 1.2 1.01  CALCIUM 10.3  --  10.1 10.2 9.6   < > = values in this interval not displayed.   Recent Labs    03/22/19 0808 05/01/19 1037 05/19/19 0000  AST 17 26 19   ALT 15 21 19   ALKPHOS 73 65 77  BILITOT 1.2 1.5*  --   PROT 6.4* 6.8  --   ALBUMIN 4.1 4.0  --    Recent Labs    05/01/19 1037 05/19/19 0000 06/02/19 0132 07/28/19 0000 10/06/19 1010  WBC 3.5*   < > 4.1 2.7 2.4*  NEUTROABS 2.4  --  3.0 2 1.4*  HGB 15.5   < > 15.0 13.9 13.7  HCT 47.4   < > 46.2 42 43.9  MCV 95.4  --  93.9  --  94.0  PLT PLATELET CLUMPS NOTED ON SMEAR, COUNT APPEARS DECREASED   < > 78* 85* 109*   < > = values in this interval not displayed.   Lab Results  Component Value Date   TSH 0.84 05/19/2019   Lab Results  Component Value Date   HGBA1C 6 08/22/2019   Lab Results  Component Value Date   CHOL 123 05/19/2019   HDL 30 (A) 05/19/2019   LDLCALC 62 05/19/2019   TRIG 154 05/19/2019    Significant Diagnostic Results in last 30 days:  CT IMAGE GUIDED FLUID DRAIN BY CATHETER  Result Date: 10/06/2019 INDICATION: 86 year old male with urinary retention and chronic indwelling Foley  catheter. He presents for suprapubic catheter placement. EXAM: Suprapubic catheter placement (CT-guided drain) COMPARISON:  None. MEDICATIONS: None ANESTHESIA/SEDATION: Fentanyl 100 mcg IV; Versed 2 mg IV Moderate Sedation Time:  26 minutes The patient was continuously monitored during the procedure by the interventional radiology nurse under my  direct supervision. CONTRAST:  None FLUOROSCOPY TIME:  None COMPLICATIONS: None immediate. PROCEDURE: Informed written consent was obtained from the patient after a thorough discussion of the procedural risks, benefits and alternatives. All questions were addressed. A timeout was performed prior to the initiation of the procedure. A planning axial CT scan was performed. The bladder was identified. The overlying skin was marked and then sterilely prepped and draped in the standard fashion using chlorhexidine skin prep. Local anesthesia was attained by infiltration with 1% lidocaine. A small dermatotomy was made. Under intermittent CT guidance, an 18 gauge trocar needle was advanced through the low anterior abdominal wall and into the bladder. A 0.035 guidewire was then coiled within the bladder. The trocar needle was removed. The skin tract was dilated to 12 [place]. A Cook 12 [place] all-purpose drainage catheter was advanced over the wire and formed in the urinary bladder. The catheter was connected to gravity bag drainage and secured to the skin with an 0 Prolene suture. Post placement CT imaging demonstrates a well-positioned suprapubic catheter in the bladder. IMPRESSION: CT-guided placement of a 12 French suprapubic catheter. PLAN: Patient should return Interventional Radiology in 6 weeks for tube exchange and up size. Signed, Criselda Peaches, MD, [place] [facility] Radiology Electronically Signed   By: Jacqulynn Cadet M.D.   On: 10/06/2019 14:38    Assessment/Plan 1. Chronic anticoagulation Continue Coumadin 4 mg  M, W, F, Sunday and 5 mg Tuesday, Thurs, and Saturday  2. Longstanding persistent atrial fibrillation (HCC) CHA2DS2-VASc score 6 Rate is controlled, coumadin is for CVA risk reduction     Family/ staff Communication: staff  Labs/tests ordered:  INR in 2 weeks

## 2019-11-13 ENCOUNTER — Encounter: Payer: Self-pay | Admitting: Internal Medicine

## 2019-11-13 NOTE — Progress Notes (Signed)
Shanon Brow called me on call with concerns about resident's BP.  It was 198/99 with recheck after some deep breathing of 188/93.  Earlier was 191/100.  He wasn't feeling too well all day with poor appetite (unusual for him) and an episode of vomiting.  Now "feels fair" outside of his frequent complaint of penile pain though he has a suprapubic cath.  Clonidine 0.2mg  given once with BP recheck in 30 mins.

## 2019-11-14 ENCOUNTER — Encounter: Payer: Self-pay | Admitting: Adult Health

## 2019-11-14 ENCOUNTER — Non-Acute Institutional Stay (SKILLED_NURSING_FACILITY): Payer: Medicare Other | Admitting: Adult Health

## 2019-11-14 DIAGNOSIS — R339 Retention of urine, unspecified: Secondary | ICD-10-CM

## 2019-11-14 DIAGNOSIS — I10 Essential (primary) hypertension: Secondary | ICD-10-CM

## 2019-11-14 DIAGNOSIS — N3000 Acute cystitis without hematuria: Secondary | ICD-10-CM | POA: Diagnosis not present

## 2019-11-14 DIAGNOSIS — R3914 Feeling of incomplete bladder emptying: Secondary | ICD-10-CM | POA: Diagnosis not present

## 2019-11-14 DIAGNOSIS — K5901 Slow transit constipation: Secondary | ICD-10-CM | POA: Diagnosis not present

## 2019-11-14 DIAGNOSIS — I158 Other secondary hypertension: Secondary | ICD-10-CM | POA: Diagnosis not present

## 2019-11-14 DIAGNOSIS — D649 Anemia, unspecified: Secondary | ICD-10-CM | POA: Diagnosis not present

## 2019-11-14 DIAGNOSIS — R110 Nausea: Secondary | ICD-10-CM | POA: Diagnosis not present

## 2019-11-14 LAB — CBC AND DIFFERENTIAL
HCT: 39 — AB (ref 41–53)
Hemoglobin: 13.2 — AB (ref 13.5–17.5)
Neutrophils Absolute: 8
Platelets: 133 — AB (ref 150–399)
WBC: 9.4

## 2019-11-14 LAB — BASIC METABOLIC PANEL
BUN: 48 — AB (ref 4–21)
CO2: 21 (ref 13–22)
Chloride: 105 (ref 99–108)
Creatinine: 2.7 — AB (ref 0.6–1.3)
Glucose: 131
Potassium: 4.3 (ref 3.4–5.3)
Sodium: 143 (ref 137–147)

## 2019-11-14 LAB — COMPREHENSIVE METABOLIC PANEL: Calcium: 10.1 (ref 8.7–10.7)

## 2019-11-14 LAB — CBC: RBC: 4.4 (ref 3.87–5.11)

## 2019-11-14 NOTE — Progress Notes (Signed)
Location:  Occupational psychologist of Service:  SNF (31) Provider:   Cindi Carbon, ANP [place] [phone]   Gayland Curry, DO  Patient Care Team: Gayland Curry, DO as PCP - General (Geriatric Medicine) [name], MD as PCP - Cardiology (Cardiology)  Extended Emergency Contact Information Primary Emergency Contact: Leasure,[name] Address: [address]          [place], [place] [address] [name] of [place] Phone: [phone] Mobile Phone: [phone] Relation: Spouse Secondary Emergency Contact: Fischman,Samvel Address: [place] [address] [name] of [place] Phone: [phone] Mobile Phone: [phone] Relation: Son  Code Status:  DNR Goals of care: Advanced Directive information Advanced Directives 10/06/2019  Does Patient Have a Medical Advance Directive? Yes  Type of Paramedic of [place];Living will  Does patient want to make changes to medical advance directive? No - Patient declined  Copy of [place] in Chart? No - copy requested  Pre-existing out of facility DNR order (yellow form or pink MOST form) -     Chief Complaint  Patient presents with  . Acute Visit    high bp and not feeling well    HPI:  Pt is a 86 y.o. male seen today for an acute visit for high bp and not feeling well. Mr. Anstey has a hx of renal carcinoma, BPH, urinary retention and is s/p suprapubic catheter placement in April of 2021. The nurse called the oncall [facility] provider on 5/9 indicating that the catheter was leaking, the patient had vomited, and his BP was 170/102.  The nurse was instructed to notify urology regarding the catheter issues and give Clonidine. Urology oncall provider gave an order to flush with 10 cc of NS as this is a smaller lumen cath the plan was to exchange for a large one later. The catheter drained small amts of urine but was  recorded as "large" over night only because it leaked on the diaper. This morning his bp improved to 157/84, pulse 98.  He feels nauseous and appears pale. He denies any bladder discomfort, back pain, has no fever, abd pain, cough, congestion, sob, etc. He has underlying vascular dementia which makes it hard for him to communicate his needs at times. He had a poor appetite this morning. On exam I could palpate the bladder at the umbilicus. I asked the nurse to scan the bladder with our scanner which showed 963ml.  We notified urology and he has an apt at 11am.  Records indicate that he is having bowel movement every 3-4 days.  Last adequate BM was 5/6. Past Medical History:  Diagnosis Date  . Adult failure to thrive    Per incoming records [facility]  . Allergic rhinitis    Per incoming records from [facility]  . Atrial fibrillation ([place])   . BPH (benign prostatic hyperplasia)    Per incoming records from [facility]  . Cancer of kidney [facility])    Right, Per incoming records from [facility]  . Cataract    Per incoming records from [facility]  . Cerebrovascular accident, late effects    Per incoming records from [facility]  . Chronic kidney disease, stage III (moderate)    Per incoming records from [facility]  . Claudication [facility])    Per incoming records from  [employer]  . Cognitive impairment    Mild, Per incoming records from [facility]  . Constipation    Per incoming records from [facility]  . Dementia ([place])   . Diarrhea    Better off Aricept, Per incoming records from [facility]  . Diastolic dysfunction    on 2D Echo 07/2009 and 2016, Per incoming records from [facility]  . Diverticulosis    Mild, Per incoming records from [facility]  . Diverticulosis of colon  (without mention of hemorrhage)   . ED (erectile dysfunction)    Per incoming records from [facility]  . History of Coumadin therapy    Per incoming records from [facility]  . History of echocardiogram 09/2014   Per incoming records from [facility]  . Hyperlipemia   . Hypertension   . Kidney mass    Per incoming records from [facility]  . Lower leg edema    Per incoming records from [facility]  . Melanoma ([place])   . Neuropathy    Per incoming records from [facility]  . OA (osteoarthritis)    Per incoming records from [facility]  . Peripheral neuropathy    Per incoming records from [facility]  . Personal history of colonic polyps 1999 & 2004   adenomatous polyps  . Pulmonary arterial hypertension ([place])    Per incoming records from [facility]  . Rosacea    Per incoming records from [facility]  . Thrombocytopenia ([place]) 2017   Per incoming records from [facility]  . UTI (urinary tract infection)    Sepsis, Per incoming records from [facility]  . Ventricular hypertrophy   . Walker as ambulation aid    Per incoming records from [employer]   Past Surgical History:  Procedure Laterality Date  . APPENDECTOMY     Per incoming records from [facility]  . CATARACT EXTRACTION     Per incoming records from [facility]  . KNEE SURGERY     right  . PILONIDAL CYST DRAINAGE    . POLYPECTOMY     [facility]  . schwanoma tumor lumbar spine    . SPINE SURGERY     tumor removed  . THORACIC 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, Per incoming records from [facility]  . TONSILLECTOMY AND ADENOIDECTOMY      Allergies  Allergen Reactions  . Penicillins Rash     Has patient had a PCN reaction causing immediate rash, facial/tongue/throat swelling, SOB or lightheadedness with hypotension: unknown Has patient had a PCN reaction causing severe rash involving mucus membranes or skin necrosis: unknown Has patient had a PCN reaction that required hospitalization : unknown Has patient had a PCN reaction occurring within the last 10 years: no If all of the above answers are "NO", then may proceed with Cephalosporin use.     Outpatient Encounter Medications as of 11/14/2019  Medication Sig  . acetaminophen (TYLENOL) 500 MG tablet Take 500 mg by mouth 2 (two) times daily. Scheduled and every 4 hours as needed (ending 05/05/2019)Document area of discomfort and effectiveness of medication.  Marland Kitchen atorvastatin (LIPITOR) 20 MG tablet Take 20 mg by mouth every evening.   . cholecalciferol (VITAMIN D3) 25 MCG (1000 UT) tablet Take 2,000 Units by mouth daily.   . DULoxetine (  CYMBALTA) 30 MG capsule Take 30 mg by mouth daily.  . hydrochlorothiazide (MICROZIDE) 12.5 MG capsule Take 12.5 mg by mouth every other day.  . methenamine (MANDELAMINE) 1 g tablet Take 1,000 mg by mouth 2 (two) times daily.  Marland Kitchen triamcinolone lotion (KENALOG) 0.1 % Apply 1 application topically 2 (two) times daily as needed.   . warfarin (COUMADIN) 4 MG tablet Give 4 mg Monday, Wednesday, Friday, Sunday  . warfarin (COUMADIN) 5 MG tablet Give 5 mg Tuesday, Thursday, and Saturday    No facility-administered encounter medications on file as of 11/14/2019.    Review of Systems  Constitutional: Positive for activity change and appetite change. Negative for chills, diaphoresis, fatigue, fever and unexpected weight change.  HENT: Negative for congestion.   Respiratory: Negative for cough, shortness of breath, wheezing and stridor.   Cardiovascular: Positive for leg swelling. Negative for chest pain and palpitations.  Gastrointestinal: Positive for abdominal distention and constipation. Negative for  abdominal pain and diarrhea.  Genitourinary: Positive for decreased urine volume and difficulty urinating. Negative for discharge, dysuria, flank pain, frequency, hematuria, penile pain, penile swelling, scrotal swelling and testicular pain.  Musculoskeletal: Positive for gait problem. Negative for arthralgias, back pain, joint swelling and myalgias.  Neurological: Positive for weakness. Negative for dizziness, seizures, syncope, facial asymmetry, speech difficulty and headaches.  Hematological: Negative for adenopathy. Does not bruise/bleed easily.  Psychiatric/Behavioral: Positive for confusion. Negative for agitation and behavioral problems.    Immunization History  Administered Date(s) Administered  . Influenza-Unspecified 04/17/2014, 05/01/2016, 04/15/2019  . Moderna SARS-COVID-2 Vaccination 07/12/2019, 08/09/2019  . Pneumococcal Conjugate-13 04/20/2014  . Pneumococcal Polysaccharide-23 07/08/2003, 03/31/2011  . Pneumococcal-Unspecified 01/26/2004  . Td 08/23/2007  . Tdap 01/07/2012  . Zoster 07/17/2005  . Zoster Recombinat (Shingrix) 10/05/2017, 12/09/2017   Pertinent  Health Maintenance Due  Topic Date Due  . INFLUENZA VACCINE  02/05/2020  . PNA vac Low Risk Adult  Completed   Fall Risk  10/30/2019 06/23/2019 05/07/2019 05/03/2019  Falls in the past year? - 1 - -  Number falls in past yr: - 1 - -  Injury with Fall? - 1 - -  Risk for fall due to : History of fall(s);Impaired balance/gait;Impaired mobility;Mental status change History of fall(s);Impaired balance/gait;Impaired mobility History of fall(s);Impaired balance/gait;Impaired mobility;Mental status change;Medication side effect History of fall(s);Impaired balance/gait;Impaired mobility;Medication side effect;Mental status change  Follow up Falls evaluation completed;Education provided;Falls prevention discussed Falls evaluation completed Falls evaluation completed;Education provided;Falls prevention discussed Falls  evaluation completed;Education provided;Falls prevention discussed  Comment - - [facility] at [facility] all addressed at [place]:    Vitals:   11/14/19 1003  BP: (!) 157/84  Pulse: 98   There is no height or weight on file to calculate BMI. Physical Exam Vitals and nursing note reviewed.  Constitutional:      General: He is not in acute distress.    Appearance: He is not diaphoretic.  HENT:     Head: Normocephalic and atraumatic.     Mouth/Throat:     Mouth: Mucous membranes are moist.     Pharynx: Oropharynx is clear. No oropharyngeal exudate.  Eyes:     Conjunctiva/sclera: Conjunctivae normal.     Pupils: Pupils are equal, round, and reactive to light.  Neck:     Thyroid: No thyromegaly.     Vascular: No JVD.     Trachea: No tracheal deviation.  Cardiovascular:     Rate and Rhythm: Normal rate. Rhythm irregular.     Heart  sounds: No murmur.  Pulmonary:     Effort: Pulmonary effort is normal. No respiratory distress.     Breath sounds: Normal breath sounds. No wheezing.  Abdominal:     General: Bowel sounds are normal. There is no distension (bladder palpated at the umbilicus).     Palpations: Abdomen is soft.     Tenderness: There is no abdominal tenderness. There is no right CVA tenderness, left CVA tenderness or guarding.  Genitourinary:    Penis: Normal.      Comments: S/p cath wth dark yellow urine, leaking around the site.  Musculoskeletal:     Right lower leg: Edema present.     Left lower leg: Edema present.  Lymphadenopathy:     Cervical: No cervical adenopathy.  Skin:    General: Skin is warm and dry.     Coloration: Skin is pale.  Neurological:     Mental Status: He is alert and oriented to person, place, and time.     Cranial Nerves: No cranial nerve deficit.  Psychiatric:        Mood and Affect: Mood normal.     Labs reviewed: Recent Labs    05/01/19 1037 05/19/19 0000 06/02/19 0132 07/28/19 0000 10/06/19 1010    NA 140   < > 138 137 137  K 4.4   < > 3.8 4.0 4.0  CL 103   < > 104 101 103  CO2 25   < > 23 27* 25  GLUCOSE 178*  --  125*  --  104*  BUN 27*   < > 20 20 19   CREATININE 1.44*   < > 1.32* 1.2 1.01  CALCIUM 10.3  --  10.1 10.2 9.6   < > = values in this interval not displayed.   Recent Labs    03/22/19 0808 05/01/19 1037 05/19/19 0000  AST 17 26 19   ALT 15 21 19   ALKPHOS 73 65 77  BILITOT 1.2 1.5*  --   PROT 6.4* 6.8  --   ALBUMIN 4.1 4.0  --    Recent Labs    05/01/19 1037 05/19/19 0000 06/02/19 0132 07/28/19 0000 10/06/19 1010  WBC 3.5*   < > 4.1 2.7 2.4*  NEUTROABS 2.4  --  3.0 2 1.4*  HGB 15.5   < > 15.0 13.9 13.7  HCT 47.4   < > 46.2 42 43.9  MCV 95.4  --  93.9  --  94.0  PLT PLATELET CLUMPS NOTED ON SMEAR, COUNT APPEARS DECREASED   < > 78* 85* 109*   < > = values in this interval not displayed.   Lab Results  Component Value Date   TSH 0.84 05/19/2019   Lab Results  Component Value Date   HGBA1C 6 08/22/2019   Lab Results  Component Value Date   CHOL 123 05/19/2019   HDL 30 (A) 05/19/2019   LDLCALC 62 05/19/2019   TRIG 154 05/19/2019    Significant Diagnostic Results in last 30 days:  No results found.  Assessment/Plan   1. Urinary retention Due to clogged catheter which is likely why his bp was high and he was nauseous. Stat referral to urology. Nurse was able to flush the catheter and it is currently draining slowly.   2. Essential hypertension Improved, due to #1 with no focal deficits  3. Nausea Due to #1 Zofran 4 mg q 8 hr prn   4. Slow transit constipation Give dulcolax supp today Start miralax daily in the am  Family/ staff Communication: discussed with the nurse that will call urology and the pt's wife   Labs/tests ordered:  BMP CBC UA C and S

## 2019-11-17 ENCOUNTER — Non-Acute Institutional Stay (SKILLED_NURSING_FACILITY): Payer: Medicare Other | Admitting: Adult Health

## 2019-11-17 ENCOUNTER — Encounter: Payer: Self-pay | Admitting: Adult Health

## 2019-11-17 DIAGNOSIS — D649 Anemia, unspecified: Secondary | ICD-10-CM | POA: Diagnosis not present

## 2019-11-17 DIAGNOSIS — E860 Dehydration: Secondary | ICD-10-CM | POA: Diagnosis not present

## 2019-11-17 DIAGNOSIS — Z22322 Carrier or suspected carrier of Methicillin resistant Staphylococcus aureus: Secondary | ICD-10-CM | POA: Diagnosis not present

## 2019-11-17 DIAGNOSIS — R339 Retention of urine, unspecified: Secondary | ICD-10-CM | POA: Diagnosis not present

## 2019-11-17 DIAGNOSIS — R7989 Other specified abnormal findings of blood chemistry: Secondary | ICD-10-CM | POA: Diagnosis not present

## 2019-11-17 DIAGNOSIS — I4891 Unspecified atrial fibrillation: Secondary | ICD-10-CM | POA: Diagnosis not present

## 2019-11-17 DIAGNOSIS — N3000 Acute cystitis without hematuria: Secondary | ICD-10-CM | POA: Diagnosis not present

## 2019-11-17 DIAGNOSIS — D72829 Elevated white blood cell count, unspecified: Secondary | ICD-10-CM | POA: Diagnosis not present

## 2019-11-17 DIAGNOSIS — Z7901 Long term (current) use of anticoagulants: Secondary | ICD-10-CM | POA: Diagnosis not present

## 2019-11-17 LAB — BASIC METABOLIC PANEL
BUN: 39 — AB (ref 4–21)
CO2: 26 — AB (ref 13–22)
Chloride: 104 (ref 99–108)
Creatinine: 1 (ref 0.6–1.3)
Glucose: 118
Potassium: 3.9 (ref 3.4–5.3)
Sodium: 143 (ref 137–147)

## 2019-11-17 LAB — POCT INR: INR: 3.3 — AB (ref 0.9–1.1)

## 2019-11-17 LAB — CBC AND DIFFERENTIAL
HCT: 41 (ref 41–53)
Hemoglobin: 13.5 (ref 13.5–17.5)
Platelets: 138 — AB (ref 150–399)
WBC: 3.1

## 2019-11-17 LAB — PROTIME-INR: Protime: 33.2 — AB (ref 10.0–13.8)

## 2019-11-17 LAB — CBC: RBC: 4.54 (ref 3.87–5.11)

## 2019-11-17 LAB — COMPREHENSIVE METABOLIC PANEL: Calcium: 10.7 (ref 8.7–10.7)

## 2019-11-17 NOTE — Progress Notes (Signed)
Location:  Occupational psychologist of Service:  SNF (31) Provider:   Cindi Carbon, ANP [place] [phone]  Gayland Curry, DO  Patient Care Team: Gayland Curry, DO as PCP - General (Geriatric Medicine) [name], MD as PCP - Cardiology (Cardiology)  Extended Emergency Contact Information Primary Emergency Contact: Ramus,[name] Address: [address]          [place], [place] [address] [name] of [place] Phone: [phone] Mobile Phone: [phone] Relation: Spouse Secondary Emergency Contact: Staup,Rayven Address: [place] [address] [name] of [place] Phone: [phone] Mobile Phone: [phone] Relation: Son  Code Status:  DNR Goals of care: Advanced Directive information Advanced Directives 10/06/2019  Does Patient Have a Medical Advance Directive? Yes  Type of Paramedic of [place];Living will  Does patient want to make changes to medical advance directive? No - Patient declined  Copy of [place] in Chart? No - copy requested  Pre-existing out of facility DNR order (yellow form or pink MOST form) -     Chief Complaint  Patient presents with  . Acute Visit    coumadin management and pale color    HPI:  Pt is a 86 y.o. male seen today for an acute visit for coumadin management and pale color. I walked into his room to assess him prior to dosing his coumadin. His color was pale and appeared ill. His fingers were white at the tips and cold. We obtained his bp which was 135/70 sitting and 91/66 standing. Pulse in the 80s both readings. His sats were not obtainable due to his cold fingers so 2 liters of oxygen was applied. He has a suprapubic catheter in place due to a hx of BPH. It was placed in April and has a small lumen. A larger bore cath will be placed on 5/17.  He saw urology due to clogging of the catheter with urinary  retention and htn on 5/10. They recommended flushing the catheter and started him on cefpodoxime for a UTI. The final culture returned 11/17/2019 growing 100,000 colonies of MRSA and they changed his antibiotic to Doxycycline. Labs were drawn on 5/10 which showed BUN 48/Cr 2.7 likely due to the post renal obstructive process with the clogged catheter. Repeat BMP 11/17/2019 BUN 38 and Cr was 1.01. CBC draw 11/17/19 WBC 3.1, plt 138, Hgb 13.5.  He is not having any SOB or CP. No fever or back pain. He remains alert with confusion which is his baseline. Just more muted personality and sleeping more.  His catheter was clogged last evening again and the nurse flushed it and 1400 cc returned.    He is on coumadin for a hx of Afib.  Lab Results  Component Value Date   INR 3.3 (A) 11/17/2019   INR 2.6 (A) 11/03/2019   INR 2.2 (A) 10/20/2019   PROTIME 33.2 (A) 11/17/2019   PROTIME 27.8 (A) 11/03/2019   PROTIME 24.4 (A) 10/20/2019     Past Medical History:  Diagnosis Date  . Adult failure to thrive    Per incoming records from [facility]  . Allergic rhinitis    Per incoming records from [facility]  . Atrial fibrillation ([place])   . BPH (benign prostatic hyperplasia)    Per incoming records from [facility]  . Cancer of kidney ([place])    Right,  Per incoming records from [facility]  . Cataract    Per incoming records from [facility]  . Cerebrovascular accident, late effects    Per incoming records from [facility]  . Chronic kidney disease, stage III (moderate)    Per incoming records from [facility]  . Claudication [facility])    Per incoming records from [facility]  . Cognitive impairment    Mild, Per incoming records from [facility]  . Constipation    Per incoming records from [facility]  . Dementia ([place])   . Diarrhea    Better off  Aricept, Per incoming records from [facility]  . Diastolic dysfunction    on 2D Echo 07/2009 and 2016, Per incoming records from [facility]  . Diverticulosis    Mild, Per incoming records from [facility]  . Diverticulosis of colon (without mention of hemorrhage)   . ED (erectile dysfunction)    Per incoming records from [facility]  . History of Coumadin therapy    Per incoming records from [facility]  . History of echocardiogram 09/2014   Per incoming records from [facility]  . Hyperlipemia   . Hypertension   . Kidney mass    Per incoming records from [facility]  . Lower leg edema    Per incoming records from [facility]  . Melanoma ([place])   . Neuropathy    Per incoming records from [facility]  . OA (osteoarthritis)    Per incoming records from [facility]  . Peripheral neuropathy    Per incoming records from [facility]  . Personal history of colonic polyps 1999 & 2004   adenomatous polyps  . Pulmonary arterial hypertension ([place])    Per incoming records from [facility]  . Rosacea    Per incoming records from [facility]  . Thrombocytopenia ([place]) 2017   Per incoming records from [facility]  . UTI (urinary tract infection)    Sepsis, Per incoming records from [facility]  . Ventricular hypertrophy   . Walker as ambulation aid    Per incoming records from [facility]   Past Surgical History:  Procedure Laterality Date  . APPENDECTOMY     Per incoming records from [facility]  . CATARACT EXTRACTION     Per incoming records from [facility]  . KNEE SURGERY     right  . PILONIDAL CYST DRAINAGE    . POLYPECTOMY     Per incoming records from [facility]  . schwanoma  tumor lumbar spine    . SPINE SURGERY     tumor removed  . THORACIC 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, Per incoming records from [facility]  . TONSILLECTOMY AND ADENOIDECTOMY      Allergies  Allergen Reactions  . Penicillins Rash    Has patient had a PCN reaction causing immediate rash, facial/tongue/throat swelling, SOB or lightheadedness with hypotension: unknown Has patient had a PCN reaction causing severe rash involving mucus membranes or skin necrosis: unknown Has patient had a PCN reaction that required hospitalization : unknown Has patient had a PCN reaction occurring within the last 10 years: no If all of the above answers are "NO", then may proceed with Cephalosporin use.     Outpatient Encounter Medications as of 11/17/2019  Medication Sig  .  acetaminophen (TYLENOL) 500 MG tablet Take 500 mg by mouth 2 (two) times daily. Scheduled and every 4 hours as needed (ending 05/05/2019)Document area of discomfort and effectiveness of medication.  Marland Kitchen atorvastatin (LIPITOR) 20 MG tablet Take 20 mg by mouth every evening.   . cholecalciferol (VITAMIN D3) 25 MCG (1000 UT) tablet Take 2,000 Units by mouth daily.   . DULoxetine (CYMBALTA) 30 MG capsule Take 30 mg by mouth daily.  . hydrochlorothiazide (MICROZIDE) 12.5 MG capsule Take 12.5 mg by mouth every other day.  . methenamine (MANDELAMINE) 1 g tablet Take 1,000 mg by mouth 2 (two) times daily.  Marland Kitchen triamcinolone lotion (KENALOG) 0.1 % Apply 1 application topically 2 (two) times daily as needed.   . warfarin (COUMADIN) 4 MG tablet Give 4 mg Monday, Wednesday, Friday, Sunday  . warfarin (COUMADIN) 5 MG tablet Give 5 mg Tuesday, Thursday, and Saturday    No facility-administered encounter medications on file as of 11/17/2019.    Review of Systems  Constitutional: Positive for activity change. Negative for appetite change, chills, diaphoresis, fatigue, fever and unexpected weight change.  HENT:  Negative for congestion.   Respiratory: Negative for cough, shortness of breath, wheezing and stridor.   Cardiovascular: Positive for leg swelling. Negative for chest pain and palpitations.  Gastrointestinal: Negative for abdominal distention, constipation, diarrhea and nausea.  Genitourinary: Positive for decreased urine volume and difficulty urinating. Negative for dysuria, flank pain, frequency, genital sores, penile pain, penile swelling, scrotal swelling, testicular pain and urgency.  Musculoskeletal: Positive for gait problem.  Skin: Negative for wound.  Neurological: Positive for weakness.  Psychiatric/Behavioral: Positive for confusion. Negative for agitation and behavioral problems.    Immunization History  Administered Date(s) Administered  . Influenza-Unspecified 04/17/2014, 05/01/2016, 04/15/2019  . Moderna SARS-COVID-2 Vaccination 07/12/2019, 08/09/2019  . Pneumococcal Conjugate-13 04/20/2014  . Pneumococcal Polysaccharide-23 07/08/2003, 03/31/2011  . Pneumococcal-Unspecified 01/26/2004  . Td 08/23/2007  . Tdap 01/07/2012  . Zoster 07/17/2005  . Zoster Recombinat (Shingrix) 10/05/2017, 12/09/2017   Pertinent  Health Maintenance Due  Topic Date Due  . INFLUENZA VACCINE  02/05/2020  . PNA vac Low Risk Adult  Completed   Fall Risk  10/30/2019 06/23/2019 05/07/2019 05/03/2019  Falls in the past year? - 1 - -  Number falls in past yr: - 1 - -  Injury with Fall? - 1 - -  Risk for fall due to : History of fall(s);Impaired balance/gait;Impaired mobility;Mental status change History of fall(s);Impaired balance/gait;Impaired mobility History of fall(s);Impaired balance/gait;Impaired mobility;Mental status change;Medication side effect History of fall(s);Impaired balance/gait;Impaired mobility;Medication side effect;Mental status change  Follow up Falls evaluation completed;Education provided;Falls prevention discussed Falls evaluation completed Falls evaluation completed;Education  provided;Falls prevention discussed Falls evaluation completed;Education provided;Falls prevention discussed  Comment - - [facility] at [facility] all addressed at [place]:    Vitals:   11/17/19 1109  BP: 91/66  Pulse: 89   There is no height or weight on file to calculate BMI. Physical Exam Vitals and nursing note reviewed.  Constitutional:      General: He is not in acute distress.    Appearance: He is not diaphoretic.  HENT:     Head: Normocephalic and atraumatic.     Mouth/Throat:     Mouth: Mucous membranes are moist.     Pharynx: Oropharynx is clear.  Neck:     Thyroid: No thyromegaly.     Vascular: No JVD.     Trachea: No tracheal deviation.  Cardiovascular:     Rate  and Rhythm: Normal rate. Rhythm irregular.     Heart sounds: No murmur.  Pulmonary:     Effort: Pulmonary effort is normal. No respiratory distress.     Breath sounds: Normal breath sounds. No wheezing.  Abdominal:     General: Bowel sounds are normal. There is no distension.     Palpations: Abdomen is soft. There is no mass.     Tenderness: There is no abdominal tenderness. There is no right CVA tenderness or left CVA tenderness.     Hernia: No hernia is present.  Genitourinary:    Penis: Normal.   Musculoskeletal:     Comments: BLE edema +1  Lymphadenopathy:     Cervical: No cervical adenopathy.  Skin:    General: Skin is warm and dry.     Coloration: Skin is pale.  Neurological:     General: No focal deficit present.     Mental Status: He is alert. Mental status is at baseline.     Cranial Nerves: No cranial nerve deficit.  Psychiatric:     Comments: flat     Labs reviewed: Recent Labs    05/01/19 1037 05/19/19 0000 06/02/19 0132 06/02/19 0132 07/28/19 0000 10/06/19 1010 11/14/19 0000  NA 140   < > 138  --  137 137 143  K 4.4   < > 3.8   < > 4.0 4.0 4.3  CL 103   < > 104   < > 101 103 105  CO2 25   < > 23   < > 27* 25 21  GLUCOSE 178*  --  125*  --    --  104*  --   BUN 27*   < > 20  --  20 19 48*  CREATININE 1.44*   < > 1.32*  --  1.2 1.01 2.7*  CALCIUM 10.3  --  10.1   < > 10.2 9.6 10.1   < > = values in this interval not displayed.   Recent Labs    03/22/19 0808 05/01/19 1037 05/19/19 0000  AST 17 26 19   ALT 15 21 19   ALKPHOS 73 65 77  BILITOT 1.2 1.5*  --   PROT 6.4* 6.8  --   ALBUMIN 4.1 4.0  --    Recent Labs    05/01/19 1037 05/19/19 0000 06/02/19 0132 07/28/19 0000 10/06/19 1010 11/14/19 0000  WBC 3.5*   < > 4.1 2.7 2.4* 9.4  NEUTROABS 2.4  --  3.0 2 1.4* 8  HGB 15.5   < > 15.0 13.9 13.7 13.2*  HCT 47.4   < > 46.2 42 43.9 39*  MCV 95.4  --  93.9  --  94.0  --   PLT PLATELET CLUMPS NOTED ON SMEAR, COUNT APPEARS DECREASED   < > 78* 85* 109* 133*   < > = values in this interval not displayed.   Lab Results  Component Value Date   TSH 0.84 05/19/2019   Lab Results  Component Value Date   HGBA1C 6 08/22/2019   Lab Results  Component Value Date   CHOL 123 05/19/2019   HDL 30 (A) 05/19/2019   LDLCALC 62 05/19/2019   TRIG 154 05/19/2019    Significant Diagnostic Results in last 30 days:  No results found.  Assessment/Plan 1. MRSA (methicillin resistant staph aureus) culture positive Urine culture positive  Urology has started Doxycycline 100 mg bid x 7 days, will place pt on contact isolation   2. Acute cystitis without hematuria  Due to #1  3. Dehydration Begin NS at 100 cc/hr x 1 liter. I placed a 22 gauge IV in the right forearm and secure with tape.   4. Urinary retention This appears to be an intermittent issue with clogging of the catheter tubing. I have asked the nurse to assess patency and check his bladder with the bladder scanner qshift until he has the larger catheter put in on 5/17  5. Chronic anticoagulation Hold coumadin 5/13 and 5/14. Resume coumadin at current dosing and recheck INR on 5/16     Family/ staff Communication: discussed with his nurse  Labs/tests ordered:  BMP  when IV complete

## 2019-11-18 ENCOUNTER — Other Ambulatory Visit: Payer: Self-pay | Admitting: Radiology

## 2019-11-18 DIAGNOSIS — D649 Anemia, unspecified: Secondary | ICD-10-CM | POA: Diagnosis not present

## 2019-11-18 DIAGNOSIS — I10 Essential (primary) hypertension: Secondary | ICD-10-CM | POA: Diagnosis not present

## 2019-11-20 DIAGNOSIS — Z7901 Long term (current) use of anticoagulants: Secondary | ICD-10-CM | POA: Diagnosis not present

## 2019-11-20 DIAGNOSIS — I4891 Unspecified atrial fibrillation: Secondary | ICD-10-CM | POA: Diagnosis not present

## 2019-11-21 ENCOUNTER — Other Ambulatory Visit: Payer: Self-pay

## 2019-11-21 ENCOUNTER — Other Ambulatory Visit (HOSPITAL_COMMUNITY): Payer: Medicare Other

## 2019-11-21 ENCOUNTER — Telehealth: Payer: Self-pay | Admitting: Internal Medicine

## 2019-11-21 ENCOUNTER — Ambulatory Visit (HOSPITAL_COMMUNITY)
Admission: RE | Admit: 2019-11-21 | Discharge: 2019-11-21 | Disposition: A | Discharge status: Home / Self Care | Payer: Medicare Other | Source: Ambulatory Visit | Attending: Interventional Radiology | Admitting: Interventional Radiology

## 2019-11-21 DIAGNOSIS — I4891 Unspecified atrial fibrillation: Secondary | ICD-10-CM | POA: Insufficient documentation

## 2019-11-21 DIAGNOSIS — Z435 Encounter for attention to cystostomy: Secondary | ICD-10-CM | POA: Insufficient documentation

## 2019-11-21 DIAGNOSIS — Z79899 Other long term (current) drug therapy: Secondary | ICD-10-CM | POA: Insufficient documentation

## 2019-11-21 DIAGNOSIS — R102 Pelvic and perineal pain: Secondary | ICD-10-CM | POA: Diagnosis not present

## 2019-11-21 DIAGNOSIS — I129 Hypertensive chronic kidney disease with stage 1 through stage 4 chronic kidney disease, or unspecified chronic kidney disease: Secondary | ICD-10-CM | POA: Diagnosis not present

## 2019-11-21 DIAGNOSIS — N390 Urinary tract infection, site not specified: Secondary | ICD-10-CM | POA: Insufficient documentation

## 2019-11-21 DIAGNOSIS — F0390 Unspecified dementia without behavioral disturbance: Secondary | ICD-10-CM | POA: Insufficient documentation

## 2019-11-21 DIAGNOSIS — Z7901 Long term (current) use of anticoagulants: Secondary | ICD-10-CM | POA: Diagnosis not present

## 2019-11-21 DIAGNOSIS — Z8673 Personal history of transient ischemic attack (TIA), and cerebral infarction without residual deficits: Secondary | ICD-10-CM | POA: Insufficient documentation

## 2019-11-21 DIAGNOSIS — Z87891 Personal history of nicotine dependence: Secondary | ICD-10-CM | POA: Diagnosis not present

## 2019-11-21 DIAGNOSIS — D649 Anemia, unspecified: Secondary | ICD-10-CM | POA: Diagnosis not present

## 2019-11-21 DIAGNOSIS — E785 Hyperlipidemia, unspecified: Secondary | ICD-10-CM | POA: Diagnosis not present

## 2019-11-21 DIAGNOSIS — C641 Malignant neoplasm of right kidney, except renal pelvis: Secondary | ICD-10-CM | POA: Insufficient documentation

## 2019-11-21 DIAGNOSIS — N183 Chronic kidney disease, stage 3 unspecified: Secondary | ICD-10-CM | POA: Insufficient documentation

## 2019-11-21 DIAGNOSIS — N400 Enlarged prostate without lower urinary tract symptoms: Secondary | ICD-10-CM | POA: Diagnosis not present

## 2019-11-21 DIAGNOSIS — I739 Peripheral vascular disease, unspecified: Secondary | ICD-10-CM | POA: Diagnosis not present

## 2019-11-21 DIAGNOSIS — G629 Polyneuropathy, unspecified: Secondary | ICD-10-CM | POA: Diagnosis not present

## 2019-11-21 DIAGNOSIS — I10 Essential (primary) hypertension: Secondary | ICD-10-CM | POA: Diagnosis not present

## 2019-11-21 HISTORY — PX: IR CATHETER TUBE CHANGE: IMG717

## 2019-11-21 LAB — BASIC METABOLIC PANEL
BUN: 24 — AB (ref 4–21)
BUN: 24 — AB (ref 4–21)
CO2: 25 — AB (ref 13–22)
CO2: 25 — AB (ref 13–22)
Chloride: 107 (ref 99–108)
Chloride: 107 (ref 99–108)
Creatinine: 0.8 (ref 0.6–1.3)
Creatinine: 0.8 (ref 0.6–1.3)
Glucose: 97
Potassium: 4.1 (ref 3.4–5.3)
Potassium: 4.1 (ref 3.4–5.3)
Sodium: 144 (ref 137–147)
Sodium: 144 (ref 137–147)

## 2019-11-21 LAB — CBC
HCT: 43.6 % (ref 39.0–52.0)
Hemoglobin: 13.6 g/dL (ref 13.0–17.0)
MCH: 29.2 pg (ref 26.0–34.0)
MCHC: 31.2 g/dL (ref 30.0–36.0)
MCV: 93.8 fL (ref 80.0–100.0)
Platelets: 154 10*3/uL (ref 150–400)
RBC: 4.65 MIL/uL (ref 4.22–5.81)
RDW: 14.7 % (ref 11.5–15.5)
WBC: 2.7 10*3/uL — ABNORMAL LOW (ref 4.0–10.5)
nRBC: 0 % (ref 0.0–0.2)

## 2019-11-21 LAB — PROTIME-INR
INR: 2.6 — ABNORMAL HIGH (ref 0.8–1.2)
Prothrombin Time: 26.9 seconds — ABNORMAL HIGH (ref 11.4–15.2)

## 2019-11-21 LAB — COMPREHENSIVE METABOLIC PANEL: Calcium: 10.5 (ref 8.7–10.7)

## 2019-11-21 MED ORDER — LIDOCAINE HCL 1 % IJ SOLN
INTRAMUSCULAR | Status: AC
  Filled 2019-11-21: qty 20

## 2019-11-21 MED ORDER — LIDOCAINE HCL 1 % IJ SOLN
INTRAMUSCULAR | Status: AC | PRN
  Administered 2019-11-21: 10 mL

## 2019-11-21 MED ORDER — CIPROFLOXACIN IN D5W 400 MG/200ML IV SOLN: 400.0000 mg | Freq: Once | INTRAVENOUS | Status: DC

## 2019-11-21 MED ORDER — SODIUM CHLORIDE 0.9 % IV SOLN: INTRAVENOUS | Status: DC

## 2019-11-21 MED ORDER — MIDAZOLAM HCL 2 MG/2ML IJ SOLN
INTRAMUSCULAR | Status: AC | PRN
  Administered 2019-11-21: 0.5 mg via INTRAVENOUS

## 2019-11-21 MED ORDER — LIDOCAINE VISCOUS HCL 2 % MT SOLN
OROMUCOSAL | Status: AC
  Filled 2019-11-21: qty 15

## 2019-11-21 MED ORDER — FENTANYL CITRATE (PF) 100 MCG/2ML IJ SOLN
INTRAMUSCULAR | Status: AC | PRN
  Administered 2019-11-21 (×2): 25 ug via INTRAVENOUS

## 2019-11-21 MED ORDER — FENTANYL CITRATE (PF) 100 MCG/2ML IJ SOLN
INTRAMUSCULAR | Status: AC
  Filled 2019-11-21: qty 2

## 2019-11-21 MED ORDER — MIDAZOLAM HCL 2 MG/2ML IJ SOLN
INTRAMUSCULAR | Status: AC
  Filled 2019-11-21: qty 2

## 2019-11-21 NOTE — Discharge Instructions (Signed)
Suprapubic Catheter Home Guide °A suprapubic catheter is a flexible tube that is used to drain urine from the bladder into a collection bag outside the body. The catheter is inserted into the bladder through a small opening in the lower abdomen, above the pubic bone (suprapubic area) and a few inches below your belly button (navel). A tiny balloon filled with germ-free (sterile) water helps to keep the catheter in place. °The collection bag must be emptied at least once a day and cleaned at least every other day. The collection bag can be put beside your bed at night and attached to your leg during the day. You may have a large collection bag to use at night and a smaller one to use during the day. °Your suprapubic catheter may need to be changed every 4-6 weeks, or as often as recommended by your health care provider. Healing of the tract where the catheter is placed can take 6 weeks to 6 months. During that time, your health care provider may change your catheter. Once the tract is well healed, you or a caregiver will change your suprapubic catheter at home. °What are the risks? °This catheter is safe to use. However, problems can occur, including: °· Blocked urine flow. This can occur if the catheter stops working, or if you have a blood clot in your bladder or in the catheter. °· Irritation of the skin around the catheter. °· Infection. This can happen if bacteria gets into your bladder. °Supplies needed: °· Two pairs of sterile gloves. °· Paper towels. °· Catheter. °· Two syringes. °· Sterile water. °· Sterile cleaning solution. °· Lubricant. °· Collection bags. °How to change the catheter ° °1. Drink plenty of fluids during the hours before you change the catheter. °2. Wash your hands with soap and water. If soap and water are not available, use hand sanitizer. °3. Draw up sterile water into a syringe to have ready to fill the new catheter balloon. The amount will depend on the size of the balloon. °4. Have  all of your supplies ready and close to you on a paper towel. °5. Lie on your back, sitting slightly upright so that you can see the catheter and opening. °6. Put on sterile gloves. °7. Clean the skin around the catheter opening using the sterile cleaning solution. °8. Remove the water from the balloon in the catheter using a syringe. °9. Slowly remove the catheter. If the catheter seems stuck, or if you have difficulty removing it: °? Do not pull on it. °? Call your health care provider right away. °10. Place the old catheter on a paper towel to discard later. °11. Take off the used gloves, and put on a new pair. °12. Put lubricant on the end of the new catheter that will go into your bladder. °13. Clean the skin around the catheter opening using the sterile cleaning solution. °14. Gently slide the catheter through the opening in your abdomen and into the tract that leads to your bladder. °15. Wait for some urine to start flowing through the catheter. °16. When urine starts to flow through the catheter, attach the collection bag to the end of the catheter. Make sure the connection is tight. °17. Use a syringe to fill the catheter balloon with sterile water. Fill to the amount directed by your health care provider. °18. Remove the gloves and wash your hands with soap and water. °How to care for the skin around the catheter °Follow your health care provider's instructions on   caring for your skin. °· Use a clean washcloth and soapy water to clean the skin around your catheter every day. Pat the area dry with a clean paper towel. °· Do not pull on the catheter. °· Do not use ointment or lotion on this area, unless told by your health care provider. °· Check the skin around the catheter every day for signs of infection. Check for: °? Redness, swelling, or pain. °? Fluid or blood. °? Warmth. °? Pus or a bad smell. °How to empty and clean the collection bag °Empty the large collection bag every 8 hours. Empty the small  collection bag when it is about ? full. Clean the collection bag every 2-3 days, or as often as told by your health care provider. To do this: °1. Wash your hands with soap and water. If soap and water are not available, use hand sanitizer. °2. Disconnect the bag from the catheter and immediately attach a new bag to the catheter. °3. Hold the used bag over the toilet or another container. °4. Turn the valve (spigot) at the bottom of the bag to empty the urine. Empty the used bag completely. °? Do not touch the opening of the spigot. °? Do not let the opening touch the toilet or container. °5. Close the spigot tightly when the bag is empty. °6. Clean the used bag in one of the following methods: °? According to the manufacturer's instructions. °? As told by your health care provider. °7. Let the bag dry completely. Put it in a clean plastic bag before storing it. °General tips ° °· Always wash your hands before and after caring for your catheter and collection bag. Use a mild, fragrance-free soap. If soap and water are not available, use hand sanitizer. °· Clean the outside of the catheter with soap and water as often as told by your health care provider. °· Always make sure there are no twists or kinks in the catheter tube. °· Always make sure there are no leaks in the catheter or collection bag. °· Always wear the leg bag below your knee. °· Make sure the overnight drainage bag is always lower than the level of your bladder, but do not let it touch the floor. Before you go to sleep, hang the bag inside a wastebasket that is covered by a clean plastic bag. °· Drink enough fluid to keep your urine pale yellow. °· Do not take baths, swim, or use a hot tub until your health care provider approves. Ask your health care provider if you may take showers. °Contact a heath care provider if: °· You leak urine. °· You have redness, swelling, or pain around your catheter. °· You have fluid or blood coming from your catheter  opening. °· Your catheter opening feels warm to the touch. °· You have pus or a bad smell coming from your catheter opening. °· You have a fever or chills. °· Your urine flow slows down. °· Your urine becomes cloudy or smelly. °Get help right away if: °· Your catheter comes out. °· You have: °? Nausea. °? Back pain. °? Difficulty changing your catheter. °? Blood in your urine. °? No urine flow for 1 hour. °Summary °· A suprapubic catheter is a flexible tube that is used to drain urine from the bladder into a collection bag outside the body. °· Your suprapubic catheter may need to be changed every 4-6 weeks, or as recommended by your health care provider. °· Follow instructions on how to   change the catheter and how to empty and clean the collection bag. °· Always wash your hands before and after caring for your catheter and collection bag. Drink enough fluid to keep your urine pale yellow. °· Get help right away if you have difficulty changing your catheter or if there is blood in your urine. °This information is not intended to replace advice given to you by your health care provider. Make sure you discuss any questions you have with your health care provider. °Document Revised: 10/14/2018 Document Reviewed: 07/28/2018 °Elsevier Patient Education © 2020 [employer] ° °

## 2019-11-21 NOTE — Procedures (Signed)
Interventional Radiology Procedure Note  Procedure: Routine upsize of supra-pubic catheter.  New 7F pigtail catheter.    Complications: None  Recommendations:  - 1 hr dc home - May upsize in clinic   Signed,  Dulcy Fanny. Earleen Newport, DO

## 2019-11-21 NOTE — Telephone Encounter (Signed)
INR 2.74.   Continue current coumadin and recheck INR on 5/21 just after abx complete Resident otherwise was still looking a bit under the weather and did not want to be cleaned up when nurse offered.  VS nl.  Maymie Brunke L. Zev Blue, D.O. [facility] [address], [place] [address] Cell Phone (Mon-Fri 8am-5pm):  [phone] On Call:  [phone] & follow prompts after 5pm & weekends Office Phone:  [phone] Office Fax:  [phone]

## 2019-11-21 NOTE — Sedation Documentation (Signed)
Patient given 25 mg IV Fentanyl for pain with adhesive removal prior to sterile prep, patient moaning, verbally reassured.

## 2019-11-21 NOTE — Consult Note (Signed)
Chief Complaint: Recurrent UTI and suprapubic catheter clogging  Referring Physician(s): C. Wert NP and Dr. Diona Fanti  Supervising Physician: Corrie Mckusick  Patient Status: [facility] - Out-pt  History of Present Illness: [facility] is a 86 y.o. male History a fib ( on coumadin), renal cell carcinoma  BPH with continued clogging of his existing catheter and recurrent UTI. Team is requesting exchange and upsize of existing suprapubic catheter.   Past [place] History:  Diagnosis Date  . Adult failure to thrive    Per incoming records from [facility]  . Allergic rhinitis    Per incoming records from [facility]  . Atrial fibrillation ([place])   . BPH (benign prostatic hyperplasia)    Per incoming records from [facility]  . Cancer of kidney [facility])    Right, Per incoming records from [facility]  . Cataract    Per incoming records from [facility]  . Cerebrovascular accident, late effects    Per incoming records from [facility]  . Chronic kidney disease, stage III (moderate)    Per incoming records from [facility]  . Claudication [facility])    Per incoming records from [facility]  . Cognitive impairment    Mild, Per incoming records from [facility]  . Constipation    Per incoming records from [facility]  . Dementia ([place])   . Diarrhea    Better off Aricept, Per incoming records from [facility]  . Diastolic dysfunction    on 2D Echo 07/2009 and 2016, Per incoming records from [facility]  . Diverticulosis    Mild, Per incoming records from [facility]  . Diverticulosis of colon (without mention of hemorrhage)   . ED (erectile dysfunction)    Per incoming records from [facility]  . History of Coumadin therapy    Per incoming records from [facility]  . History of echocardiogram 09/2014   Per incoming records from [facility]  . Hyperlipemia   . Hypertension   . Kidney mass    Per incoming records from [facility]  . Lower leg edema    Per [facility]  . Melanoma ([place])   . Neuropathy    Per incoming records from [facility]  . OA (osteoarthritis)    Per incoming records from [facility]  . Peripheral neuropathy    Per incoming records from [facility]  . Personal history of colonic polyps 1999 & 2004   adenomatous polyps  . Pulmonary arterial hypertension ([place])    Per incoming records from [facility]  . Rosacea    Per incoming records from [facility]  . Thrombocytopenia ([place]) 2017   Per incoming records from [facility]  . UTI (urinary tract infection)    Sepsis, Per incoming records from [facility]  . Ventricular hypertrophy   . Walker as ambulation aid    Per incoming records from [facility]    Past [facility] History:  Procedure Laterality Date  . APPENDECTOMY     Per incoming records from [facility]  . CATARACT EXTRACTION     Per incoming records from [facility]  . KNEE SURGERY     right  . PILONIDAL CYST DRAINAGE    . POLYPECTOMY     Per incoming records from [facility]  .  schwanoma tumor lumbar spine    . SPINE SURGERY     tumor removed  . THORACIC 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, Per incoming records from [facility]  . TONSILLECTOMY AND ADENOIDECTOMY      Allergies: Penicillins  Medications: Prior to Admission medications   Medication Sig Start Date End Date Taking? Authorizing Provider  atorvastatin (LIPITOR) 20 MG tablet Take 20 mg by mouth every evening.    Yes [provider]    cholecalciferol (VITAMIN D3) 25 MCG (1000 UT) tablet Take 2,000 Units by mouth daily.    Yes [provider]  DULoxetine (CYMBALTA) 30 MG capsule Take 30 mg by mouth daily.   Yes [provider]  methenamine (MANDELAMINE) 1 g tablet Take 1,000 mg by mouth 2 (two) times daily.   Yes [provider]  warfarin (COUMADIN) 4 MG tablet Give 4 mg Monday, Wednesday, Friday, Sunday   Yes [provider]  acetaminophen (TYLENOL) 500 MG tablet Take 500 mg by mouth 2 (two) times daily. Scheduled and every 4 hours as needed (ending 05/05/2019)Document area of discomfort and effectiveness of medication.    [provider]  hydrochlorothiazide (MICROZIDE) 12.5 MG capsule Take 12.5 mg by mouth every other day.    [provider]  triamcinolone lotion (KENALOG) 0.1 % Apply 1 application topically 2 (two) times daily as needed.     [provider]  warfarin (COUMADIN) 5 MG tablet Give 5 mg Tuesday, Thursday, and Saturday     [provider]     Family History  Problem Relation Age of Onset  . Stroke Father   . CVA Father   . Tuberculosis Father   . Neuropathy Neg Hx     Social History   Socioeconomic History  . Marital status: Married    Spouse name: Not on file  . Number of children: 2  . Years of education: Not on file  . Highest education level: Bachelor's degree (e.g., BA, AB, BS)  Occupational History  . Occupation: retired  Tobacco Use  . Smoking status: Former Smoker    Packs/day: 3.00    Types: Cigarettes    Quit date: 06/09/1975    Years since quitting: 44.4  . Smokeless tobacco: Never Used  Substance and Sexual Activity  . Alcohol use: Yes    Alcohol/week: 0.0 standard drinks    Comment: 2-3 drinks per day  . Drug use: No  . Sexual activity: Not on file  Other Topics Concern  . Not on file  Social History Narrative   Patient is married with 2 children, 3 grandchildren    Patient is retired Engineer, maintenance (IT)   Patient  lives at [place] Strain:   . Difficulty of Paying Living Expenses:   Food Insecurity:   . Worried About Charity fundraiser in [place] Last Year:   . Arboriculturist in [place] Last Year:   Transportation Needs:   . Film/video editor ([place]):   Marland Kitchen Lack of Transportation (Non-[place]):   Physical Activity:   . Days of Exercise per Week:   . Minutes of Exercise per Session:   Stress:   . Feeling of Stress :   Social Connections:   . Frequency of Communication with Friends and Family:   . Frequency of Social Gatherings with Friends and Family:   . Attends Religious Services:   . Active Member of Clubs  or Organizations:   . Attends Archivist Meetings:   Marland Kitchen Marital Status:     Review of Systems: A 12 point ROS discussed and pertinent positives are indicated in Peter HPI above.  All other systems are negative.  Review of Systems  Constitutional: Negative for fever.  HENT: Negative for congestion.   Respiratory: Negative for cough and shortness of breath.   Cardiovascular: Negative for chest pain.  Gastrointestinal: Negative for abdominal pain.  Neurological: Negative for headaches.  Psychiatric/Behavioral: Negative for behavioral problems and confusion.    Vital Signs: BP (!) 145/65   Pulse (!) 50   Temp 97.6 F (36.4 C) (Skin)   Resp 20   Ht 6\' 7"  (2.007 m)   Wt 250 lb (113.4 kg)   SpO2 96%   BMI 28.16 kg/m   Physical Exam Vitals and nursing note reviewed.  Constitutional:      Appearance: He is well-developed.  HENT:     Head: Normocephalic.  Cardiovascular:     Rate and Rhythm: Normal rate and regular rhythm.     Heart sounds: Normal heart sounds.  Pulmonary:     Effort: Pulmonary effort is normal.  Genitourinary:    Comments: Supra pubic catheter present Musculoskeletal:        General: Normal range of motion.     Cervical back: Normal range of motion.  Skin:    General: Skin is dry.    Neurological:     Mental Status: He is alert and oriented to person, place, and time.     Imaging: No results found.  Labs:  CBC: Recent Labs    07/28/19 0000 10/06/19 1010 11/14/19 0000 11/21/19 1340  WBC 2.7 2.4* 9.4 2.7*  HGB 13.9 13.7 13.2* 13.6  HCT 42 43.9 39* 43.6  PLT 85* 109* 133* 154    COAGS: Recent Labs    10/20/19 0000 11/03/19 0000 11/17/19 0000 11/21/19 1340  INR 2.2* 2.6* 3.3* 2.6*    BMP: Recent Labs    03/22/19 0808 03/22/19 0808 05/01/19 1037 05/19/19 0000 06/02/19 0132 07/28/19 0000 10/06/19 1010 11/14/19 0000  NA 140   < > 140   < > 138 137 137 143  K 4.2   < > 4.4   < > 3.8 4.0 4.0 4.3  CL 104   < > 103   < > 104 101 103 105  CO2 28   < > 25   < > 23 27* 25 21  GLUCOSE 206*  --  178*  --  125*  --  104*  --   BUN 17   < > 27*   < > 20 20 19  48*  CALCIUM 10.3   < > 10.3  --  10.1 10.2 9.6 10.1  CREATININE 1.30*   < > 1.44*   < > 1.32* 1.2 1.01 2.7*  GFRNONAA 50*  --  44*  --  49*  --  >60  --   GFRAA 58*  --  51*  --  57*  --  >60  --    < > = values in this interval not displayed.    LIVER FUNCTION TESTS: Recent Labs    12/10/18 0000 03/22/19 0808 05/01/19 1037 05/19/19 0000  BILITOT  --  1.2 1.5*  --   AST 15 17 26 19   ALT 13 15 21 19   ALKPHOS 65 73 65 77  PROT  --  6.4* 6.8  --   ALBUMIN  --  4.1  4.0  --     TUMOR MARKERS: No results for input(s): AFPTM, CEA, CA199, CHROMGRNA in [place] last 8760 hours.  Assessment and Plan:  86 y.o, male outpatient. History a fib ( on coumadin), renal cell carcinoma  BPH with continued clogging of his existing catheter and recurrent UTI. Team is requesting exchange and upsize of existing suprapubic catheter.  Pertinent Imaging None since placement  Pertinent IR History 4.1.21 - Placement of suprapubic catheter - 12 Fr  Pertinent Allergies PCN  INR 2.6  All labs are within acceptable parameters.  Patient is afebrile. Patient is on coumadin for a fib   Risks and benefits  discussed with [place] patient including bleeding, infection, damage to adjacent structures, bowel perforation/fistula connection, and sepsis.  All of [place] patient's and [place] patient's wife's  questions were answered, patient is agreeable to proceed.  Consent signed and in chart.   Thank you for this interesting consult.  I greatly enjoyed meeting SACARIO REETZ and look forward to participating in their care.  A copy of this report was sent to [place] requesting provider on this date.  Electronically Signed: Avel Peace, NP 11/21/2019, 3:31 PM   I spent a total of    25 Minutes in face to face in clinical consultation, greater than 50% of which was counseling/coordinating care for supra pubic catheter upsize

## 2019-11-23 ENCOUNTER — Encounter: Payer: Self-pay | Admitting: Internal Medicine

## 2019-11-24 ENCOUNTER — Encounter: Payer: Self-pay | Admitting: Adult Health

## 2019-11-24 ENCOUNTER — Non-Acute Institutional Stay (SKILLED_NURSING_FACILITY): Payer: Medicare Other | Admitting: Adult Health

## 2019-11-24 DIAGNOSIS — R339 Retention of urine, unspecified: Secondary | ICD-10-CM | POA: Diagnosis not present

## 2019-11-24 DIAGNOSIS — I10 Essential (primary) hypertension: Secondary | ICD-10-CM

## 2019-11-24 DIAGNOSIS — Z7901 Long term (current) use of anticoagulants: Secondary | ICD-10-CM

## 2019-11-24 DIAGNOSIS — I4891 Unspecified atrial fibrillation: Secondary | ICD-10-CM | POA: Diagnosis not present

## 2019-11-24 DIAGNOSIS — E860 Dehydration: Secondary | ICD-10-CM | POA: Diagnosis not present

## 2019-11-24 LAB — POCT INR: INR: 2.7 — AB (ref 0.9–1.1)

## 2019-11-24 LAB — PROTIME-INR: Protime: 29.1 — AB (ref 10.0–13.8)

## 2019-11-24 NOTE — Progress Notes (Signed)
Location:  Occupational psychologist of Service:  SNF (31) Provider:   Cindi Carbon, ANP [place] [phone]   Gayland Curry, DO  Patient Care Team: Gayland Curry, DO as PCP - General (Geriatric Medicine) [name], MD as PCP - Cardiology (Cardiology)  Extended Emergency Contact Information Primary Emergency Contact: Cates,[name] Address: [address]          [place], [place] [address] [name] of [place] Phone: [phone] Mobile Phone: [phone] Relation: Spouse Secondary Emergency Contact: Margerum,Noriel Address: [place] [address] [name] of [place] Phone: [phone] Mobile Phone: [phone] Relation: Son  Code Status:  DNR Goals of care: Advanced Directive information Advanced Directives 11/21/2019  Does Patient Have a Medical Advance Directive? Yes  Type of Paramedic of [place];Living will  Does patient want to make changes to medical advance directive? -  Copy of [place] in Chart? No - copy requested  Pre-existing out of facility DNR order (yellow form or pink MOST form) -     Chief Complaint  Patient presents with  . Acute Visit    f/u dehydration     HPI:  Pt is a 86 y.o. male seen today for an acute visit for f/u regarding dehydration. He was seen on 5/10 due to urinary retention associated with a new s/p cath placed by IR due to BPH with urinary retention. The tubing was small and easily clogged. He was in acute renal failure due to post obstructive process and was orthostatic and given 2 liter of fluid on 5/13 and 5/14.  The catheter was flushed and flow improved. On 5/17 he went back to IR and was changed to a 16 Fr cath and tolerated this well.  The catheter is draining well with no issues of bladder pain or discomfort. His care giver reports that he is walking slower and attributes this to a fall he took  a few weeks ago which left him with some bruising to his right hip and low back. He denies any back or leg pain. He remains ambulatory with the walker. He completed 7 days of doxycycline for MRSA UTI 5/13-5/20 and is off isolation. HCTZ is on on hold.  5/10 BUN 48.3, Cr 2.7 Ca 10.9 5/13 BUN 38.7 Cr 1.02 Ca 10.7 5/17 BUN 24.4, Cr 0.84, Na 144, K 4.1 Ca 10.5   He continues to have a pale color to his skin. Hands are always cold and fingers tips are white. No pain or loss of sensation is reported. CBC was WNL  He is on coumadin for afib. No reports of bleeding or excessive bruising other than the listed bruises above.  Lab Results  Component Value Date   INR 2.7 (A) 11/24/2019   INR 2.6 (H) 11/21/2019   INR 3.3 (A) 11/17/2019   PROTIME 29.1 (A) 11/24/2019   PROTIME 33.2 (A) 11/17/2019   PROTIME 27.8 (A) 11/03/2019    Sats are recorded 90-96% and he is not having any cough, wheeze, or difficulty breathing   Past Medical History:  Diagnosis Date  . Adult failure to thrive    Per incoming records from [facility]  . Allergic rhinitis    Per incoming records from [facility]  . Atrial fibrillation ([place])   . BPH (benign prostatic hyperplasia)    Per incoming records from [facility]  .  Cancer of kidney [facility])    Right, Per incoming records from [facility]  . Cataract    Per incoming records from [facility]  . Cerebrovascular accident, late effects    Per incoming records from [facility]  . Chronic kidney disease, stage III (moderate)    Per incoming records from [facility]  . Claudication [facility])    Per incoming records from [facility]  . Cognitive impairment    Mild, Per incoming records from [facility]  . Constipation    Per incoming records from [facility]  . Dementia ([place])   . Diarrhea    Better off Aricept, Per  incoming records from [facility]  . Diastolic dysfunction    on 2D Echo 07/2009 and 2016, Per incoming records from [facility]  . Diverticulosis    Mild, Per incoming records from [facility]  . Diverticulosis of colon (without mention of hemorrhage)   . ED (erectile dysfunction)    Per incoming records from [facility]  . History of Coumadin therapy    Per incoming records from [facility]  . History of echocardiogram 09/2014   Per incoming records from [facility]  . Hyperlipemia   . Hypertension   . Kidney mass    Per incoming records from [facility]  . Lower leg edema    Per incoming records from [facility]  . Melanoma ([place])   . Neuropathy    Per incoming records from [facility]  . OA (osteoarthritis)    Per incoming records from [facility]  . Peripheral neuropathy    Per incoming records from [facility]  . Personal history of colonic polyps 1999 & 2004   adenomatous polyps  . Pulmonary arterial hypertension ([place])    Per incoming records from [facility]  . Rosacea    Per incoming records from [facility]  . Thrombocytopenia ([place]) 2017   Per incoming records from [facility]  . UTI (urinary tract infection)    Sepsis, Per incoming records from [facility]  . Ventricular hypertrophy   . Walker as ambulation aid    Per incoming records from [employer] [facility]   Past Surgical History:  Procedure Laterality Date  . APPENDECTOMY     Per incoming records from [facility]  . CATARACT EXTRACTION     Per incoming records from [facility]  . IR CATHETER TUBE CHANGE  11/21/2019  . KNEE SURGERY     right  . PILONIDAL CYST DRAINAGE    . POLYPECTOMY     Per incoming records from [facility]  . schwanoma tumor lumbar spine    . SPINE SURGERY     tumor removed  . THORACIC 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, Per incoming records from [facility]  . TONSILLECTOMY AND ADENOIDECTOMY      Allergies  Allergen Reactions  . Penicillins Rash    Has patient had a PCN reaction causing immediate rash, facial/tongue/throat swelling, SOB or lightheadedness with hypotension: unknown Has patient had a PCN reaction causing severe rash involving mucus membranes or skin necrosis: unknown Has patient had a PCN reaction that required hospitalization : unknown Has patient had a PCN reaction occurring within the last 10 years: no If all of the above answers are "NO", then may proceed with Cephalosporin  use.     Outpatient Encounter Medications as of 11/24/2019  Medication Sig  . acetaminophen (TYLENOL) 500 MG tablet Take 500 mg by mouth 2 (two) times daily. Scheduled and every 4 hours as needed (ending 05/05/2019)Document area of discomfort and effectiveness of medication.  Marland Kitchen atorvastatin (LIPITOR) 20 MG tablet Take 20 mg by mouth every evening.   . cholecalciferol (VITAMIN D3) 25 MCG (1000 UT) tablet Take 2,000 Units by mouth daily.   . DULoxetine (CYMBALTA) 30 MG capsule Take 30 mg by mouth daily.  . methenamine (MANDELAMINE) 1 g tablet Take 1,000 mg by mouth 2 (two) times daily.  Marland Kitchen triamcinolone lotion (KENALOG) 0.1 % Apply 1 application topically 2 (two) times daily as needed.   . warfarin (COUMADIN) 4 MG tablet Give 4 mg Monday, Wednesday, Friday, Sunday  . warfarin (COUMADIN) 5 MG tablet Give 5 mg Tuesday, Thursday, and Saturday   . [DISCONTINUED] hydrochlorothiazide (MICROZIDE) 12.5 MG capsule Take 12.5 mg by mouth every other day.   No facility-administered encounter medications on file as of 11/24/2019.    Review of Systems  Constitutional: Negative for activity change (moving slower), appetite change, chills, diaphoresis, fatigue,  fever and unexpected weight change.  HENT: Negative for congestion.   Respiratory: Negative for cough, shortness of breath, wheezing and stridor.   Cardiovascular: Positive for leg swelling. Negative for chest pain and palpitations.  Gastrointestinal: Negative for abdominal distention, abdominal pain, anal bleeding, blood in stool, constipation, diarrhea, nausea, rectal pain and vomiting.  Genitourinary: Negative for decreased urine volume, difficulty urinating and dysuria.  Musculoskeletal: Positive for gait problem. Negative for arthralgias, back pain, joint swelling and myalgias.  Skin: Negative for wound.       Bruises to hip and back   Neurological: Negative for dizziness, seizures, syncope, facial asymmetry, speech difficulty, weakness and headaches.  Hematological: Negative for adenopathy. Does not bruise/bleed easily.  Psychiatric/Behavioral: Negative for agitation, behavioral problems and confusion.    Immunization History  Administered Date(s) Administered  . Influenza-Unspecified 04/17/2014, 05/01/2016, 04/15/2019  . Moderna SARS-COVID-2 Vaccination 07/12/2019, 08/09/2019  . Pneumococcal Conjugate-13 04/20/2014  . Pneumococcal Polysaccharide-23 07/08/2003, 03/31/2011  . Pneumococcal-Unspecified 01/26/2004  . Td 08/23/2007  . Tdap 01/07/2012  . Zoster 07/17/2005  . Zoster Recombinat (Shingrix) 10/05/2017, 12/09/2017   Pertinent  Health Maintenance Due  Topic Date Due  . INFLUENZA VACCINE  02/05/2020  . PNA vac Low Risk Adult  Completed   Fall Risk  10/30/2019 06/23/2019 05/07/2019 05/03/2019  Falls in the past year? - 1 - -  Number falls in past yr: - 1 - -  Injury with Fall? - 1 - -  Risk for fall due to : History of fall(s);Impaired balance/gait;Impaired mobility;Mental status change History of fall(s);Impaired balance/gait;Impaired mobility History of fall(s);Impaired balance/gait;Impaired mobility;Mental status change;Medication side effect History of fall(s);Impaired  balance/gait;Impaired mobility;Medication side effect;Mental status change  Follow up Falls evaluation completed;Education provided;Falls prevention discussed Falls evaluation completed Falls evaluation completed;Education provided;Falls prevention discussed Falls evaluation completed;Education provided;Falls prevention discussed  Comment - - [facility] at [facility] all addressed at [place]:    Vitals:   11/24/19 1111  BP: (!) 148/65  Pulse: 66   There is no height or weight on file to calculate BMI. Physical Exam Vitals and nursing note reviewed.  Constitutional:      General: He is not in acute distress.    Appearance: He is not diaphoretic.  HENT:     Head: Normocephalic and atraumatic.  Neck:  Thyroid: No thyromegaly.     Vascular: No JVD.     Trachea: No tracheal deviation.  Cardiovascular:     Rate and Rhythm: Normal rate. Rhythm irregular.     Pulses:          Radial pulses are 2+ on the right side and 2+ on the left side.       Dorsalis pedis pulses are 2+ on the right side and 2+ on the left side.     Heart sounds: No murmur.  Pulmonary:     Effort: Pulmonary effort is normal. No respiratory distress.     Breath sounds: Normal breath sounds. No wheezing.  Abdominal:     General: Bowel sounds are normal. There is no distension.     Palpations: Abdomen is soft.     Tenderness: There is no abdominal tenderness.  Genitourinary:    Comments: S/p cath draining clear yellow urine Musculoskeletal:     Lumbar back: Normal. No swelling, edema, deformity, tenderness or bony tenderness. Negative right straight leg raise test and negative left straight leg raise test.     Left hip: Normal.     Right lower leg: Edema (+1) present.     Left lower leg: Edema (+1) present.  Lymphadenopathy:     Cervical: No cervical adenopathy.  Skin:    General: Skin is warm and dry.     Coloration: Skin is pale.     Comments: Ecchymoses to right hip and lumbar  spine. Not tender to touch. Pale finger tips with normal sensation and movement to both hands.   Neurological:     General: No focal deficit present.     Mental Status: He is alert. Mental status is at baseline.     Cranial Nerves: No cranial nerve deficit.     Labs reviewed: Recent Labs    05/01/19 1037 05/19/19 0000 06/02/19 0132 07/28/19 0000 10/06/19 1010 11/14/19 0000 11/17/19 0700  NA 140   < > 138   < > 137 143 143  K 4.4   < > 3.8   < > 4.0 4.3 3.9  CL 103   < > 104   < > 103 105 104  CO2 25   < > 23   < > 25 21 26*  GLUCOSE 178*  --  125*  --  104*  --   --   BUN 27*   < > 20   < > 19 48* 39*  CREATININE 1.44*   < > 1.32*   < > 1.01 2.7* 1.0  CALCIUM 10.3  --  10.1   < > 9.6 10.1 10.7   < > = values in this interval not displayed.   Recent Labs    03/22/19 0808 05/01/19 1037 05/19/19 0000  AST 17 26 19   ALT 15 21 19   ALKPHOS 73 65 77  BILITOT 1.2 1.5*  --   PROT 6.4* 6.8  --   ALBUMIN 4.1 4.0  --    Recent Labs    05/19/19 0000 06/02/19 0132 06/02/19 0132 07/28/19 0000 07/28/19 0000 10/06/19 1010 11/14/19 0000 11/17/19 0700 11/21/19 1340  WBC   < > 4.1   < > 2.7   < > 2.4* 9.4 3.1 2.7*  NEUTROABS  --  3.0  --  2  --  1.4* 8  --   --   HGB   < > 15.0   < > 13.9   < > 13.7 13.2* 13.5 13.6  HCT   < > 46.2   < > 42   < > 43.9 39* 41 43.6  MCV  --  93.9  --   --   --  94.0  --   --  93.8  PLT   < > 78*   < > 85*   < > 109* 133* 138* 154   < > = values in this interval not displayed.   Lab Results  Component Value Date   TSH 0.84 05/19/2019   Lab Results  Component Value Date   HGBA1C 6 08/22/2019   Lab Results  Component Value Date   CHOL 123 05/19/2019   HDL 30 (A) 05/19/2019   LDLCALC 62 05/19/2019   TRIG 154 05/19/2019    Significant Diagnostic Results in last 30 days:  IR Catheter Tube Change  Result Date: 11/21/2019 INDICATION: 86 year old male with a history of urinary retention. A prior suprapubic catheter has been placed  10/06/2019. EXAM: IMAGE GUIDED EXCHANGE AND UP SIZE OF SUPRAPUBIC CATHETER COMPARISON:  None. MEDICATIONS: None. ANESTHESIA/SEDATION: Fentanyl 50 mcg IV; Versed 0.5 mg IV Moderate Sedation Time:  10 minutes The patient was continuously monitored during the procedure by the interventional radiology nurse under my direct supervision. CONTRAST:  10 cc-administered into the collecting system(s) FLUOROSCOPY TIME:  Fluoroscopy Time: 0 minutes 12 seconds (0.2 mGy). COMPLICATIONS: None PROCEDURE: Informed written consent was obtained from the patient after a thorough discussion of the procedural risks, benefits and alternatives. All questions were addressed. Maximal Sterile Barrier Technique was utilized including caps, mask, sterile gowns, sterile gloves, sterile drape, hand hygiene and skin antiseptic. A timeout was performed prior to the initiation of the procedure. Patient was positioned supine position on the fluoroscopy table. Scout images were acquired. Contrast was injected through the indwelling suprapubic catheter confirming location within the urinary bladder. 1% lidocaine was used for local anesthesia. Catheter was then ligated. Amplatz wire was placed into the urinary bladder. Catheter was removed. Lidocaine jelly was then injected into the subcutaneous tract. 14 French dilation was performed. A 16 [place] Thal drain was then placed into the urinary bladder. Wire was removed, pigtail catheter was formed and locked. Contrast was injected confirming location within the urinary bladder. Catheter was sutured in position.  Attached to gravity drainage. Final image was stored. Patient tolerated the procedure well and remained hemodynamically stable throughout. No complications were encountered and no significant blood loss. IMPRESSION: Status post image guided exchange and up size of suprapubic catheter to a new 16 French pigtail catheter. Signed, Dulcy Fanny. Dellia Nims, RPVI [facility] Inter[facility] Radiology  [facility] Radiology PLAN: The indwelling catheter can be removed with simple amputation of the catheter and withdrawal, at the point when office based exchanges are initiated with balloon retention Foley catheter. Electronically Signed   By: Corrie Mckusick D.O.   On: 11/21/2019 17:21    Assessment/Plan  1. Urinary retention Resoled, continue cath maintenance per [facility] protocol Follow up with urology next month as indicated.   2. Dehydration Improved.   3. Essential hypertension Off hctz due to period dehydration and now with mild elevated Ca. Will continue to monitor edema and bp which both are adequate at this time.   4. Hypercalcemia Mild, possibly due to dehydration and/or hctz use Will recheck in one to re evaluate  5. Current use of long term anticoagulation Therapeutic, continue current dose of coumadin.  Recheck INR in 1 week     Family/ staff Communication: nurse   Labs/tests ordered:  INR in 1 week, BMP in two weeks

## 2019-11-25 ENCOUNTER — Encounter: Payer: Self-pay | Admitting: Internal Medicine

## 2019-12-01 ENCOUNTER — Encounter: Payer: Self-pay | Admitting: Adult Health

## 2019-12-01 ENCOUNTER — Non-Acute Institutional Stay (SKILLED_NURSING_FACILITY): Payer: Medicare Other | Admitting: Adult Health

## 2019-12-01 DIAGNOSIS — Z7901 Long term (current) use of anticoagulants: Secondary | ICD-10-CM

## 2019-12-01 DIAGNOSIS — I4811 Longstanding persistent atrial fibrillation: Secondary | ICD-10-CM

## 2019-12-01 DIAGNOSIS — I482 Chronic atrial fibrillation, unspecified: Secondary | ICD-10-CM | POA: Diagnosis not present

## 2019-12-01 LAB — PROTIME-INR: Protime: 31.9 — AB (ref 10.0–13.8)

## 2019-12-01 LAB — POCT INR: INR: 3.1 — AB (ref <=1.1)

## 2019-12-01 NOTE — Progress Notes (Signed)
Location:  Occupational psychologist of Service:  SNF (31) Provider:   Cindi Carbon, ANP [place] [phone]   Gayland Curry, DO  Patient Care Team: Gayland Curry, DO as PCP - General (Geriatric Medicine) [name], MD as PCP - Cardiology (Cardiology)  Extended Emergency Contact Information Primary Emergency Contact: Aultman,[name] Address: [address]          [place], [place] [address] [name] of [place] Phone: [phone] Mobile Phone: [phone] Relation: Spouse Secondary Emergency Contact: Pinkard,Jaysiah Address: [place] [address] [name] of [place] Phone: [phone] Mobile Phone: [phone] Relation: Son  Code Status:  DNR Goals of care: Advanced Directive information Advanced Directives 11/21/2019  Does Patient Have a Medical Advance Directive? Yes  Type of Paramedic of [place];Living will  Does patient want to make changes to medical advance directive? -  Copy of [place] in Chart? No - copy requested  Pre-existing out of facility DNR order (yellow form or pink MOST form) -     Chief Complaint  Patient presents with  . Acute Visit    coumadin management     HPI:  Pt is a 86 y.o. male seen today for an acute visit for coumadin management. He has a hx of longstanding afib and is taking coumadin. No issues with excessive bleeding or bruising are reported. He has a suprapubic cath in place at this time with no current issues.  Lab Results  Component Value Date   INR 3.1 (A) 12/01/2019   INR 2.7 (A) 11/24/2019   INR 2.6 (H) 11/21/2019   PROTIME 31.9 (A) 12/01/2019   PROTIME 29.1 (A) 11/24/2019   PROTIME 33.2 (A) 11/17/2019     Past Medical History:  Diagnosis Date  . Adult failure to thrive    Per incoming records from [facility]  . Allergic rhinitis    Per incoming records from  [facility]  . Atrial fibrillation ([place])   . BPH (benign prostatic hyperplasia)    Per incoming records from [facility]  . Cancer of kidney [facility])    Right, Per incoming records from [facility]  . Cataract    Per incoming records from [facility]  . Cerebrovascular accident, late effects    Per incoming records from [facility]  . Chronic kidney disease, stage III (moderate)    Per incoming records from [facility]  . [facility])    Per incoming records from [facility]  . Cognitive impairment    Mild, Per incoming records from [facility]  . Constipation    Per incoming records from [facility]  . Dementia ([place])   . Diarrhea    Better off Aricept, Per incoming records from [facility]  . Diastolic dysfunction    on 2D Echo 07/2009 and 2016, Per incoming records from [facility]  . Diverticulosis    Mild, Per incoming records from [facility]  . Diverticulosis of colon (without mention of hemorrhage)   . ED (erectile dysfunction)    Per incoming records from [facility]  . History of Coumadin therapy    Per incoming records from [facility]  . History of echocardiogram 09/2014   Per incoming records from [facility]  . Hyperlipemia   . Hypertension   .  Kidney mass    Per incoming records from [facility]  . Lower leg edema    Per incoming records from [facility]  . Melanoma ([place])   . Neuropathy    Per incoming records from [facility]  . OA (osteoarthritis)    Per incoming records from [facility]  . Peripheral neuropathy    Per incoming records from [facility]  . Personal history of colonic polyps 1999 & 2004   adenomatous polyps  . Pulmonary  arterial hypertension ([place])    Per incoming records from [facility]  . Rosacea    Per incoming records from [facility]  . Thrombocytopenia ([place]) 2017   Per incoming records from [facility]  . UTI (urinary tract infection)    Sepsis, Per incoming records from [facility]  . Ventricular hypertrophy   . Walker as ambulation aid    Per incoming records from [employer]   Past Surgical History:  Procedure Laterality Date  . APPENDECTOMY     Per incoming records from [facility]  . CATARACT EXTRACTION     Per incoming records from [facility]  . IR CATHETER TUBE CHANGE  11/21/2019  . KNEE SURGERY     right  . PILONIDAL CYST DRAINAGE    . POLYPECTOMY     Per incoming records from [facility]  . schwanoma tumor lumbar spine    . SPINE SURGERY     tumor removed  . THORACIC 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, Per incoming records from [facility]  . TONSILLECTOMY AND ADENOIDECTOMY      Allergies  Allergen Reactions  . Penicillins Rash    Has patient had a PCN reaction causing immediate rash, facial/tongue/throat swelling, SOB or lightheadedness with hypotension: unknown Has patient had a PCN reaction causing severe rash involving mucus membranes or skin necrosis: unknown Has patient had a PCN reaction that required hospitalization : unknown Has patient had a PCN reaction occurring within the last 10 years: no If all of the above answers are "NO", then may proceed with Cephalosporin use.     Outpatient Encounter Medications as of 12/01/2019  Medication Sig  . acetaminophen (TYLENOL) 500 MG tablet Take 500 mg by mouth 2 (two) times daily. Scheduled and every 4 hours as needed (ending 05/05/2019)Document area of discomfort and effectiveness of medication.  Marland Kitchen atorvastatin (LIPITOR) 20 MG tablet Take 20 mg by mouth  every evening.   . cholecalciferol (VITAMIN D3) 25 MCG (1000 UT) tablet Take 2,000 Units by mouth daily.   . DULoxetine (CYMBALTA) 30 MG capsule Take 30 mg by mouth daily.  . methenamine (MANDELAMINE) 1 g tablet Take 1,000 mg by mouth 2 (two) times daily.  Marland Kitchen triamcinolone lotion (KENALOG) 0.1 % Apply 1 application topically 2 (two) times daily as needed.   . warfarin (COUMADIN) 4 MG tablet Give 4 mg Monday, Wednesday, Friday, Sunday  . [DISCONTINUED] warfarin (COUMADIN) 5 MG tablet Give 5 mg Tuesday, Thursday, and Saturday    No facility-administered encounter medications on file as of 12/01/2019.    Review of Systems  Musculoskeletal: Positive for gait problem.  Hematological: Does not bruise/bleed easily.  Psychiatric/Behavioral: Positive for confusion.    Immunization History  Administered Date(s) Administered  . Influenza-Unspecified 04/17/2014, 05/01/2016, 04/15/2019  . Moderna SARS-COVID-2 Vaccination 07/12/2019, 08/09/2019  . Pneumococcal Conjugate-13 04/20/2014  . Pneumococcal Polysaccharide-23 07/08/2003, 03/31/2011  . Pneumococcal-Unspecified  01/26/2004  . Td 08/23/2007  . Tdap 01/07/2012  . Zoster 07/17/2005  . Zoster Recombinat (Shingrix) 10/05/2017, 12/09/2017   Pertinent  Health Maintenance Due  Topic Date Due  . INFLUENZA VACCINE  02/05/2020  . PNA vac Low Risk Adult  Completed   Fall Risk  10/30/2019 06/23/2019 05/07/2019 05/03/2019  Falls in the past year? - 1 - -  Number falls in past yr: - 1 - -  Injury with Fall? - 1 - -  Risk for fall due to : History of fall(s);Impaired balance/gait;Impaired mobility;Mental status change History of fall(s);Impaired balance/gait;Impaired mobility History of fall(s);Impaired balance/gait;Impaired mobility;Mental status change;Medication side effect History of fall(s);Impaired balance/gait;Impaired mobility;Medication side effect;Mental status change  Follow up Falls evaluation completed;Education provided;Falls prevention  discussed Falls evaluation completed Falls evaluation completed;Education provided;Falls prevention discussed Falls evaluation completed;Education provided;Falls prevention discussed  Comment - - [facility] all addressed at [place]:    There were no vitals filed for this visit. There is no height or weight on file to calculate BMI. Physical Exam Vitals reviewed.  Neurological:     Mental Status: He is alert.     Labs reviewed: Recent Labs    05/01/19 1037 05/19/19 0000 06/02/19 0132 07/28/19 0000 10/06/19 1010 11/14/19 0000 11/17/19 0700  NA 140   < > 138   < > 137 143 143  K 4.4   < > 3.8   < > 4.0 4.3 3.9  CL 103   < > 104   < > 103 105 104  CO2 25   < > 23   < > 25 21 26*  GLUCOSE 178*  --  125*  --  104*  --   --   BUN 27*   < > 20   < > 19 48* 39*  CREATININE 1.44*   < > 1.32*   < > 1.01 2.7* 1.0  CALCIUM 10.3  --  10.1   < > 9.6 10.1 10.7   < > = values in this interval not displayed.   Recent Labs    03/22/19 0808 05/01/19 1037 05/19/19 0000  AST 17 26 19   ALT 15 21 19   ALKPHOS 73 65 77  BILITOT 1.2 1.5*  --   PROT 6.4* 6.8  --   ALBUMIN 4.1 4.0  --    Recent Labs    05/19/19 0000 06/02/19 0132 06/02/19 0132 07/28/19 0000 07/28/19 0000 10/06/19 1010 11/14/19 0000 11/17/19 0700 11/21/19 1340  WBC   < > 4.1   < > 2.7   < > 2.4* 9.4 3.1 2.7*  NEUTROABS  --  3.0  --  2  --  1.4* 8  --   --   HGB   < > 15.0   < > 13.9   < > 13.7 13.2* 13.5 13.6  HCT   < > 46.2   < > 42   < > 43.9 39* 41 43.6  MCV  --  93.9  --   --   --  94.0  --   --  93.8  PLT   < > 78*   < > 85*   < > 109* 133* 138* 154   < > = values in this interval not displayed.   Lab Results  Component Value Date   TSH 0.84 05/19/2019   Lab Results  Component Value Date   HGBA1C 6 08/22/2019   Lab Results  Component Value Date  CHOL 123 05/19/2019   HDL 30 (A) 05/19/2019   LDLCALC 62 05/19/2019   TRIG 154 05/19/2019    Significant  Diagnostic Results in last 30 days:  IR Catheter Tube Change  Result Date: 11/21/2019 INDICATION: 86 year old male with a history of urinary retention. A prior suprapubic catheter has been placed 10/06/2019. EXAM: IMAGE GUIDED EXCHANGE AND UP SIZE OF SUPRAPUBIC CATHETER COMPARISON:  None. MEDICATIONS: None. ANESTHESIA/SEDATION: Fentanyl 50 mcg IV; Versed 0.5 mg IV Moderate Sedation Time:  10 minutes The patient was continuously monitored during the procedure by the interventional radiology nurse under my direct supervision. CONTRAST:  10 cc-administered into the collecting system(s) FLUOROSCOPY TIME:  Fluoroscopy Time: 0 minutes 12 seconds (0.2 mGy). COMPLICATIONS: None PROCEDURE: Informed written consent was obtained from the patient after a thorough discussion of the procedural risks, benefits and alternatives. All questions were addressed. Maximal Sterile Barrier Technique was utilized including caps, mask, sterile gowns, sterile gloves, sterile drape, hand hygiene and skin antiseptic. A timeout was performed prior to the initiation of the procedure. Patient was positioned supine position on the fluoroscopy table. Scout images were acquired. Contrast was injected through the indwelling suprapubic catheter confirming location within the urinary bladder. 1% lidocaine was used for local anesthesia. Catheter was then ligated. Amplatz wire was placed into the urinary bladder. Catheter was removed. Lidocaine jelly was then injected into the subcutaneous tract. 78 French dilation was performed. A 16 [place] Thal drain was then placed into the urinary bladder. Wire was removed, pigtail catheter was formed and locked. Contrast was injected confirming location within the urinary bladder. Catheter was sutured in position.  Attached to gravity drainage. Final image was stored. Patient tolerated the procedure well and remained hemodynamically stable throughout. No complications were encountered and no significant  blood loss. IMPRESSION: Status post image guided exchange and up size of suprapubic catheter to a new 16 French pigtail catheter. Signed, Dulcy Fanny. Dellia Nims, RPVI [facility] Radiology PLAN: The indwelling catheter can be removed with simple amputation of the catheter and withdrawal, at the point when office based exchanges are initiated with balloon retention Foley catheter. Electronically Signed   By: Corrie Mckusick D.O.   On: 11/21/2019 17:21    Assessment/Plan 1. Chronic anticoagulation Reduce coumadin to 4 mg qd and recheck INR in 1 week   2. Longstanding persistent atrial fibrillation (HCC) CHA2DS2-VASc score 6 Rate is controlled, coumadin is for CVA risk reduction     Family/ staff Communication: staff  Labs/tests ordered:  INR in 1 week

## 2019-12-07 ENCOUNTER — Encounter: Payer: Self-pay | Admitting: Internal Medicine

## 2019-12-07 DIAGNOSIS — E559 Vitamin D deficiency, unspecified: Secondary | ICD-10-CM | POA: Diagnosis not present

## 2019-12-07 DIAGNOSIS — R5383 Other fatigue: Secondary | ICD-10-CM | POA: Diagnosis not present

## 2019-12-07 LAB — VITAMIN D 25 HYDROXY (VIT D DEFICIENCY, FRACTURES): Vit D, 25-Hydroxy: 19.7

## 2019-12-08 ENCOUNTER — Non-Acute Institutional Stay (SKILLED_NURSING_FACILITY): Payer: Medicare Other | Admitting: Adult Health

## 2019-12-08 ENCOUNTER — Encounter: Payer: Self-pay | Admitting: Adult Health

## 2019-12-08 DIAGNOSIS — R338 Other retention of urine: Secondary | ICD-10-CM | POA: Diagnosis not present

## 2019-12-08 DIAGNOSIS — D696 Thrombocytopenia, unspecified: Secondary | ICD-10-CM

## 2019-12-08 DIAGNOSIS — I4811 Longstanding persistent atrial fibrillation: Secondary | ICD-10-CM | POA: Diagnosis not present

## 2019-12-08 DIAGNOSIS — D649 Anemia, unspecified: Secondary | ICD-10-CM | POA: Diagnosis not present

## 2019-12-08 DIAGNOSIS — I482 Chronic atrial fibrillation, unspecified: Secondary | ICD-10-CM | POA: Diagnosis not present

## 2019-12-08 DIAGNOSIS — N401 Enlarged prostate with lower urinary tract symptoms: Secondary | ICD-10-CM

## 2019-12-08 DIAGNOSIS — Z7901 Long term (current) use of anticoagulants: Secondary | ICD-10-CM | POA: Diagnosis not present

## 2019-12-08 DIAGNOSIS — L819 Disorder of pigmentation, unspecified: Secondary | ICD-10-CM | POA: Diagnosis not present

## 2019-12-08 LAB — BASIC METABOLIC PANEL
BUN: 14 (ref 4–21)
BUN: 14 (ref 4–21)
CO2: 20 (ref 13–22)
CO2: 20 (ref 13–22)
Chloride: 105 (ref 99–108)
Chloride: 105 (ref 99–108)
Creatinine: 0.9 (ref 0.6–1.3)
Creatinine: 0.9 (ref 0.6–1.3)
Glucose: 90
Glucose: 90
Potassium: 4.8 (ref 3.4–5.3)
Potassium: 4.8 (ref 3.4–5.3)
Sodium: 141 (ref 137–147)
Sodium: 141 (ref 137–147)

## 2019-12-08 LAB — PROTIME-INR: Protime: 27.2 — AB (ref 10.0–13.8)

## 2019-12-08 LAB — COMPREHENSIVE METABOLIC PANEL
Calcium: 10.6 (ref 8.7–10.7)
Calcium: 10.6 (ref 8.7–10.7)

## 2019-12-08 LAB — POCT INR: INR: 2.5 — AB (ref 0.9–1.1)

## 2019-12-08 NOTE — Progress Notes (Signed)
Location:  Occupational psychologist of Service:  SNF (31) Provider: Cindi Carbon, ANP [place] [phone]   Gayland Curry, DO  Patient Care Team: Gayland Curry, DO as PCP - General (Geriatric Medicine) [name], MD as PCP - Cardiology (Cardiology)  Extended Emergency Contact Information Primary Emergency Contact: Malizia,[name] Address: [address]          [place], [place] [address] [name] of [place] Phone: [phone] Mobile Phone: [phone] Relation: Spouse Secondary Emergency Contact: Zehring,Shishir Address: [place] [address] [name] of [place] Phone: [phone] Mobile Phone: [phone] Relation: Son  Code Status:  DNR Goals of care: Advanced Directive information Advanced Directives 11/21/2019  Does Patient Have a Medical Advance Directive? Yes  Type of Paramedic of [place];Living will  Does patient want to make changes to medical advance directive? -  Copy of [place] in Chart? No - copy requested  Pre-existing out of facility DNR order (yellow form or pink MOST form) -     Chief Complaint  Patient presents with  . Acute Visit    coumadin management and elevated PTH    HPI:  Pt is a 86 y.o. male seen today for an acute visit for coumadin management and elevated calcium and PTH.   Resident is on coumadin for CVA risk reduction due to afib. No recent issues with bruising or bleeding are noted. Hx of thrombocytopenia, last check 138 on 11/17/19 Lab Results  Component Value Date   INR 2.5 (A) 12/08/2019   INR 3.1 (A) 12/01/2019   INR 2.7 (A) 11/24/2019   PROTIME 27.2 (A) 12/08/2019   PROTIME 31.9 (A) 12/01/2019   PROTIME 29.1 (A) 11/24/2019     Ca has been elevated slight but trending down 5/14 11.0, 5/17 10.5.  This occurred in the setting of ARF associated with urinary retention due to clogged  catheter and decreased intake.  He was treated with IVF and his s/p cath was exchanged for a larger bore cath on 5/17. Of note patient has a hx of renal mass that is followed by urology.   BUN/Cr 5/10 48.3/2.7, 24.4/0.84 5/17  Pt feels well by his account. No dizziness, bladder pain, distention, fever, cough, sob, chest pain, or other symptoms.   Has pale color to fingers with occasional blue discoloration present for 4-5 months off and on since he had covid    Off hctz since 5/13. BP and weight are ok Intact PTH 86.15 and Vit D 19.7 Past Medical History:  Diagnosis Date  . Adult failure to thrive    Per incoming records from [facility]  . Allergic rhinitis    Per incoming records from [facility]  . Atrial fibrillation ([place])   . BPH (benign prostatic hyperplasia)    Per incoming records from [facility]  . Cancer of kidney [facility])    Right, Per incoming records from [facility]  . Cataract    Per incoming records from [facility]  . Cerebrovascular accident, late effects    Per incoming records from [facility]  . Chronic kidney disease, stage III (moderate)    Per incoming records from [facility]  . Claudication [facility])    Per incoming records from [facility]  . Cognitive impairment    Mild, Per incoming records from [facility]  . Constipation    Per incoming records from [facility]  . Dementia ([place])   . Diarrhea    Better off Aricept, Per incoming records from [facility]  . Diastolic dysfunction    on 2D Echo 07/2009 and 2016, Per incoming records from [facility]  . Diverticulosis    Mild, Per incoming records from [facility]  . Diverticulosis of colon (without mention of hemorrhage)   . ED (erectile dysfunction)    Per incoming records from [facility]  . History of Coumadin therapy    Per incoming records from [facility]  . History of echocardiogram 09/2014   Per incoming records from [facility]  . Hyperlipemia   . Hypertension   . Kidney mass    Per incoming records from [facility]  . Lower leg edema    Per incoming records from [facility]  . Melanoma ([place])   . Neuropathy    Per incoming records from [facility]  . OA (osteoarthritis)    Per incoming records from [facility]  . Peripheral neuropathy    Per incoming records from [facility]  . Personal history of colonic polyps 1999 & 2004   adenomatous polyps  . Pulmonary arterial hypertension ([place])    Per incoming records from [facility]  . Rosacea    Per incoming records from [facility]  . Thrombocytopenia ([place]) 2017   Per incoming records from [facility]  . UTI (urinary tract infection)    Sepsis, Per incoming records from [facility]  . Ventricular hypertrophy   . Walker as ambulation aid    Per incoming records from [employer]   Past Surgical History:  Procedure Laterality Date  . APPENDECTOMY     Per incoming records from [facility]  . CATARACT EXTRACTION     Per incoming records from [facility]  . IR CATHETER TUBE CHANGE  11/21/2019  . KNEE SURGERY     right  . PILONIDAL CYST DRAINAGE    . POLYPECTOMY     Per incoming records from [facility]  . schwanoma tumor lumbar spine    . SPINE SURGERY     tumor removed  . THORACIC 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, Per incoming records from [facility]  . TONSILLECTOMY AND ADENOIDECTOMY      Allergies  Allergen Reactions  . Penicillins Rash    Has patient had a PCN reaction causing immediate rash,  facial/tongue/throat swelling, SOB or lightheadedness with hypotension: unknown Has patient had a PCN reaction causing severe rash involving mucus membranes or skin necrosis: unknown Has patient had a PCN reaction that required hospitalization : unknown Has patient had a PCN reaction occurring within the last 10 years: no If all of the above answers are "NO", then may proceed with Cephalosporin use.     Outpatient Encounter Medications as of 12/08/2019  Medication Sig  . acetaminophen (TYLENOL) 500 MG tablet Take 500 mg by mouth 2 (two) times daily. Scheduled and every 4 hours as needed (ending 05/05/2019)Document area of discomfort and effectiveness of medication.  Marland Kitchen atorvastatin (LIPITOR) 20 MG tablet Take 20 mg by mouth every evening.   . cholecalciferol (VITAMIN D3) 25 MCG (1000 UT) tablet Take 2,000 Units by mouth daily.   . DULoxetine (CYMBALTA) 30 MG capsule Take 30 mg by mouth  daily.  . methenamine (MANDELAMINE) 1 g tablet Take 1,000 mg by mouth 2 (two) times daily.  Marland Kitchen triamcinolone lotion (KENALOG) 0.1 % Apply 1 application topically 2 (two) times daily as needed.   . warfarin (COUMADIN) 4 MG tablet 4 mg daily.    No facility-administered encounter medications on file as of 12/08/2019.    Review of Systems  Constitutional: Negative for activity change, appetite change, chills, diaphoresis, fatigue, fever and unexpected weight change.  Respiratory: Negative for cough, shortness of breath, wheezing and stridor.   Cardiovascular: Negative for chest pain, palpitations and leg swelling.  Gastrointestinal: Negative for abdominal distention, abdominal pain, constipation and diarrhea.  Genitourinary: Negative for difficulty urinating and dysuria.  Musculoskeletal: Positive for gait problem. Negative for arthralgias, back pain, joint swelling and myalgias.  Neurological: Negative for dizziness, seizures, syncope, facial asymmetry, speech difficulty, weakness and headaches.  Hematological:  Negative for adenopathy. Does not bruise/bleed easily.  Psychiatric/Behavioral: Positive for confusion. Negative for agitation and behavioral problems.    Immunization History  Administered Date(s) Administered  . Influenza-Unspecified 04/17/2014, 05/01/2016, 04/15/2019  . Moderna SARS-COVID-2 Vaccination 07/12/2019, 08/09/2019  . Pneumococcal Conjugate-13 04/20/2014  . Pneumococcal Polysaccharide-23 07/08/2003, 03/31/2011  . Pneumococcal-Unspecified 01/26/2004  . Td 08/23/2007  . Tdap 01/07/2012  . Zoster 07/17/2005  . Zoster Recombinat (Shingrix) 10/05/2017, 12/09/2017   Pertinent  Health Maintenance Due  Topic Date Due  . INFLUENZA VACCINE  02/05/2020  . PNA vac Low Risk Adult  Completed   Fall Risk  10/30/2019 06/23/2019 05/07/2019 05/03/2019  Falls in the past year? - 1 - -  Number falls in past yr: - 1 - -  Injury with Fall? - 1 - -  Risk for fall due to : History of fall(s);Impaired balance/gait;Impaired mobility;Mental status change History of fall(s);Impaired balance/gait;Impaired mobility History of fall(s);Impaired balance/gait;Impaired mobility;Mental status change;Medication side effect History of fall(s);Impaired balance/gait;Impaired mobility;Medication side effect;Mental status change  Follow up Falls evaluation completed;Education provided;Falls prevention discussed Falls evaluation completed Falls evaluation completed;Education provided;Falls prevention discussed Falls evaluation completed;Education provided;Falls prevention discussed  Comment - - [facility] all addressed at [place]:    There were no vitals filed for this visit. There is no height or weight on file to calculate BMI. Physical Exam Constitutional:      General: He is not in acute distress.    Appearance: He is not diaphoretic.  HENT:     Head: Normocephalic and atraumatic.     Nose: Nose normal.     Mouth/Throat:     Pharynx: Oropharynx is clear.  Neck:      Thyroid: No thyromegaly.     Vascular: No JVD.     Trachea: No tracheal deviation.  Cardiovascular:     Rate and Rhythm: Normal rate and regular rhythm.     Heart sounds: No murmur.  Pulmonary:     Effort: Pulmonary effort is normal. No respiratory distress.     Breath sounds: Normal breath sounds. No wheezing.  Abdominal:     General: Bowel sounds are normal. There is no distension.     Palpations: Abdomen is soft.     Tenderness: There is no abdominal tenderness (slightly tender at the bladder). There is no right CVA tenderness or left CVA tenderness.     Comments: Clear yellow urine in the bag  Musculoskeletal:     Cervical back: No rigidity.     Right lower leg: No edema.     Left lower leg: No edema.  Lymphadenopathy:     Cervical: No cervical adenopathy.  Skin:    General: Skin is warm and dry.  Neurological:     General: No focal deficit present.     Mental Status: He is alert. Mental status is at baseline.     Comments: Oriented to self and placed     Labs reviewed: Recent Labs    05/01/19 1037 05/19/19 0000 06/02/19 0132 07/28/19 0000 10/06/19 1010 10/06/19 1010 11/14/19 0000 11/17/19 0700 11/21/19 0000  NA 140   < > 138   < > 137  --  143 143 144  K 4.4   < > 3.8   < > 4.0   < > 4.3 3.9 4.1  CL 103   < > 104   < > 103   < > 105 104 107  CO2 25   < > 23   < > 25   < > 21 26* 25*  GLUCOSE 178*  --  125*  --  104*  --   --   --   --   BUN 27*   < > 20   < > 19  --  48* 39* 24*  CREATININE 1.44*   < > 1.32*   < > 1.01  --  2.7* 1.0 0.8  CALCIUM 10.3  --  10.1   < > 9.6  --  10.1 10.7  --    < > = values in this interval not displayed.   Recent Labs    03/22/19 0808 05/01/19 1037 05/19/19 0000  AST 17 26 19   ALT 15 21 19   ALKPHOS 73 65 77  BILITOT 1.2 1.5*  --   PROT 6.4* 6.8  --   ALBUMIN 4.1 4.0  --    Recent Labs    05/19/19 0000 06/02/19 0132 06/02/19 0132 07/28/19 0000 07/28/19 0000 10/06/19 1010 11/14/19 0000 11/17/19 0700  11/21/19 1340  WBC   < > 4.1   < > 2.7   < > 2.4* 9.4 3.1 2.7*  NEUTROABS  --  3.0  --  2  --  1.4* 8  --   --   HGB   < > 15.0   < > 13.9   < > 13.7 13.2* 13.5 13.6  HCT   < > 46.2   < > 42   < > 43.9 39* 41 43.6  MCV  --  93.9  --   --   --  94.0  --   --  93.8  PLT   < > 78*   < > 85*   < > 109* 133* 138* 154   < > = values in this interval not displayed.   Lab Results  Component Value Date   TSH 0.84 05/19/2019   Lab Results  Component Value Date   HGBA1C 6 08/22/2019   Lab Results  Component Value Date   CHOL 123 05/19/2019   HDL 30 (A) 05/19/2019   LDLCALC 62 05/19/2019   TRIG 154 05/19/2019    Significant Diagnostic Results in last 30 days:  IR Catheter Tube Change  Result Date: 11/21/2019 INDICATION: 86 year old male with a history of urinary retention. A prior suprapubic catheter has been placed 10/06/2019. EXAM: IMAGE GUIDED EXCHANGE AND UP SIZE OF SUPRAPUBIC CATHETER COMPARISON:  None. MEDICATIONS: None. ANESTHESIA/SEDATION: Fentanyl 50 mcg IV; Versed 0.5 mg IV Moderate Sedation Time:  10 minutes The patient was continuously monitored during the procedure by the interventional radiology nurse under  my direct supervision. CONTRAST:  10 cc-administered into the collecting system(s) FLUOROSCOPY TIME:  Fluoroscopy Time: 0 minutes 12 seconds (0.2 mGy). COMPLICATIONS: None PROCEDURE: Informed written consent was obtained from the patient after a thorough discussion of the procedural risks, benefits and alternatives. All questions were addressed. Maximal Sterile Barrier Technique was utilized including caps, mask, sterile gowns, sterile gloves, sterile drape, hand hygiene and skin antiseptic. A timeout was performed prior to the initiation of the procedure. Patient was positioned supine position on the fluoroscopy table. Scout images were acquired. Contrast was injected through the indwelling suprapubic catheter confirming location within the urinary bladder. 1% lidocaine was used  for local anesthesia. Catheter was then ligated. Amplatz wire was placed into the urinary bladder. Catheter was removed. Lidocaine jelly was then injected into the subcutaneous tract. 13 French dilation was performed. A 16 [place] Thal drain was then placed into the urinary bladder. Wire was removed, pigtail catheter was formed and locked. Contrast was injected confirming location within the urinary bladder. Catheter was sutured in position.  Attached to gravity drainage. Final image was stored. Patient tolerated the procedure well and remained hemodynamically stable throughout. No complications were encountered and no significant blood loss. IMPRESSION: Status post image guided exchange and up size of suprapubic catheter to a new 16 French pigtail catheter. Signed, Dulcy Fanny. Dellia Nims, RPVI [facility] Radiology PLAN: The indwelling catheter can be removed with simple amputation of the catheter and withdrawal, at the point when office based exchanges are initiated with balloon retention Foley catheter. Electronically Signed   By: Corrie Mckusick D.O.   On: 11/21/2019 17:21    Assessment/Plan  1. Longstanding persistent atrial fibrillation (HCC) Rate is controlled without meds  2. Current use of long term anticoagulation Continue current dose of coumadin recheck INR   3. Hypercalcemia Improved with IVF and off hctz Noted elevation in intact PTH, ionized ca pending Likely secondary and not primary related. Await Ca and if needed may try Calcitriol   4. Urinary retention due to benign prostatic hyperplasia No issues with s/p cath in place, followed by urology  Bladder scanned with 30ml  5. Thrombocytopenia (HCC) Chronic issue that is unchanged, followed by Dr Alen Blew ?myleodysplasia  6. Discoloration of the finger   Unclear etiology, not painful. Sats and other vitals are WNL. Pt does not appear toxic. Will check CXR   Family/ staff  Communication: discussed with his nurse and Dr. Mariea Clonts   Labs/tests ordered:  CXR today, INR in 3 weeks

## 2019-12-09 ENCOUNTER — Encounter: Payer: Self-pay | Admitting: Internal Medicine

## 2019-12-09 DIAGNOSIS — J811 Chronic pulmonary edema: Secondary | ICD-10-CM | POA: Diagnosis not present

## 2019-12-19 DIAGNOSIS — R3914 Feeling of incomplete bladder emptying: Secondary | ICD-10-CM | POA: Diagnosis not present

## 2019-12-19 DIAGNOSIS — N401 Enlarged prostate with lower urinary tract symptoms: Secondary | ICD-10-CM | POA: Diagnosis not present

## 2019-12-23 ENCOUNTER — Non-Acute Institutional Stay (SKILLED_NURSING_FACILITY): Payer: Medicare Other | Admitting: Adult Health

## 2019-12-23 ENCOUNTER — Encounter: Payer: Self-pay | Admitting: Adult Health

## 2019-12-23 ENCOUNTER — Encounter: Payer: Self-pay | Admitting: Internal Medicine

## 2019-12-23 DIAGNOSIS — Z9359 Other cystostomy status: Secondary | ICD-10-CM | POA: Diagnosis not present

## 2019-12-23 DIAGNOSIS — D696 Thrombocytopenia, unspecified: Secondary | ICD-10-CM

## 2019-12-23 DIAGNOSIS — I4811 Longstanding persistent atrial fibrillation: Secondary | ICD-10-CM | POA: Diagnosis not present

## 2019-12-23 DIAGNOSIS — R338 Other retention of urine: Secondary | ICD-10-CM

## 2019-12-23 DIAGNOSIS — N2889 Other specified disorders of kidney and ureter: Secondary | ICD-10-CM

## 2019-12-23 DIAGNOSIS — N401 Enlarged prostate with lower urinary tract symptoms: Secondary | ICD-10-CM | POA: Diagnosis not present

## 2019-12-23 DIAGNOSIS — F0150 Vascular dementia without behavioral disturbance: Secondary | ICD-10-CM | POA: Diagnosis not present

## 2019-12-23 DIAGNOSIS — I5032 Chronic diastolic (congestive) heart failure: Secondary | ICD-10-CM | POA: Diagnosis not present

## 2019-12-23 NOTE — Progress Notes (Signed)
Location:  Occupational psychologist of Service:  SNF (31) Provider:   Cindi Singh, ANP [place] [phone]   Peter Curry, DO  Patient Care Team: Peter Curry, DO as PCP - General (Geriatric Medicine) Peter Crome, MD as PCP - Cardiology (Cardiology)  Extended Emergency Contact Information Primary Emergency Contact: Singh,Peter Address: [address]          [place], [place] [address] Peter Singh of [place] Phone: [phone] Mobile Phone: [phone] Relation: Spouse Secondary Emergency Contact: Singh,Peter Address: [place] [address] Peter Singh of [place] Phone: [phone] Mobile Phone: [phone] Relation: Son  Code Status:  DNR Goals of care: Advanced Directive information Advanced Directives 11/21/2019  Does Patient Have a Medical Advance Directive? Yes  Type of Paramedic of [place];Living will  Does patient want to make changes to medical advance directive? -  Copy of [place] in Chart? No - copy requested  Pre-existing out of facility DNR order (yellow form or pink MOST form) -     Chief Complaint  Patient presents with  . Medical Management of Chronic Issues    HPI:  Pt is a 86 y.o. male seen today for medical management of chronic diseases.    [name] has a hx of vascular dementia and resides in skilled care. He is doing well and has no acute complaints. In review of matrix notes in he did have a fall that was mechanical in nature earlier in the week and sustained a large skin tear with bruising to the RUA. The area is covered with a polymem and change by the staff. There is no concern for fever or drainage at this time.  He continues on coumadin due to afib with no acute bleeding episodes  He has a hx of a right renal mass followed by urology. No pain or hematuria reported CT 06/16/18 revealed right  upper pole renal mass measuring 3.8 x 2.2 cm with no adenopathy or renal vein involvement.   He is also followed by [name] for low plt and low wbc, thought to be possible myelodysplasia. Conservative monitoring has been the plan of care due to his dementia.   He had a s/p cath put in April with exchange for a larger bore cath in May. This seems to be draining well with no bladder pain or fever.   He was having an issue with blue/white finger tips. Sats were normal but difficult to obtain. This resolved without incidence  CHF: has chronic unchanged edema in both feet and wears compression hose. HCTZ stopped due to dehydration and elevated calcium.  He has no sob, doe, pnd, or change in edema. Weight is trending upward but there is uncertainty in the readings  Functional status: ambulatory with a walker, has s/p cath Past Medical History:  Diagnosis Date  . Adult failure to thrive    Per incoming records from [facility]  . Allergic rhinitis    Per incoming records from [facility]  . Atrial fibrillation ([place])   . BPH (benign prostatic hyperplasia)    Per incoming records from [facility]  . Cancer of kidney [facility])    Right, Per incoming records from [facility]  . Cataract    Per incoming records from [facility]  . Cerebrovascular accident, late effects    Per incoming  records from [facility]  . Chronic kidney disease, stage III (moderate)    Per incoming records from [facility]  . Claudication [facility])    Per incoming records from [facility]  . Cognitive impairment    Mild, Per incoming records from [facility]  . Constipation    Per incoming records from [facility]  . Dementia ([place])   . Diarrhea    Better off Aricept, Per incoming records from [facility]  . Diastolic dysfunction    on 2D Echo 07/2009  and 2016, Per incoming records from [facility]  . Diverticulosis    Mild, Per incoming records from [facility]  . Diverticulosis of colon (without mention of hemorrhage)   . ED (erectile dysfunction)    Per incoming records from [facility]  . History of Coumadin therapy    Per incoming records from [facility]  . History of echocardiogram 09/2014   Per incoming records from [facility]  . Hyperlipemia   . Hypertension   . Kidney mass    Per incoming records from [facility]  . Lower leg edema    Per incoming records from [facility]  . Melanoma ([place])   . Neuropathy    Per incoming records from [facility]  . OA (osteoarthritis)    Per incoming records from [facility]  . Peripheral neuropathy    Per incoming records from [facility]  . Personal history of colonic polyps 1999 & 2004   adenomatous polyps  . Pulmonary arterial hypertension ([place])    Per incoming records from [facility]  . Rosacea    Per incoming records from [facility]  . Thrombocytopenia ([place]) 2017   Per incoming records from [facility]  . UTI (urinary tract infection)    Sepsis, Per incoming records from [facility]  . Ventricular hypertrophy   . Walker as ambulation aid    Per incoming records from [employer]   Past Surgical History:  Procedure Laterality Date  . APPENDECTOMY     Per incoming records from [facility]  . CATARACT EXTRACTION     Per incoming records from [facility]  . IR CATHETER TUBE CHANGE  11/21/2019  . KNEE SURGERY     right  . PILONIDAL CYST DRAINAGE    . POLYPECTOMY     Per incoming records from [facility]  . schwanoma tumor lumbar spine    . SPINE SURGERY     tumor removed  . THORACIC  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, Per incoming records from [facility]  . TONSILLECTOMY AND ADENOIDECTOMY      Allergies  Allergen Reactions  . Penicillins Rash    Has patient had a PCN reaction causing immediate rash, facial/tongue/throat swelling, SOB or lightheadedness with hypotension: unknown Has patient had a PCN reaction causing severe rash involving mucus membranes or skin necrosis: unknown Has patient had a PCN reaction that required hospitalization : unknown Has patient had a PCN reaction occurring within the last 10 years: no If all of the above answers are "NO", then may proceed with Cephalosporin use.     Outpatient Encounter Medications as of 12/23/2019  Medication Sig  . acetaminophen (TYLENOL) 500 MG tablet Take 500 mg by mouth 2 (two) times daily. Scheduled and every 4 hours as needed (ending 05/05/2019)Document  area of discomfort and effectiveness of medication.  Marland Kitchen atorvastatin (LIPITOR) 20 MG tablet Take 20 mg by mouth every evening.   . cholecalciferol (VITAMIN D3) 25 MCG (1000 UT) tablet Take 2,000 Units by mouth daily.   . DULoxetine (CYMBALTA) 30 MG capsule Take 30 mg by mouth daily.  . methenamine (MANDELAMINE) 1 g tablet Take 1,000 mg by mouth 2 (two) times daily.  . polyethylene glycol (MIRALAX / GLYCOLAX) 17 g packet Take 17 g by mouth daily.  Marland Kitchen triamcinolone lotion (KENALOG) 0.1 % Apply 1 application topically 2 (two) times daily as needed.   . warfarin (COUMADIN) 4 MG tablet 4 mg daily.    No facility-administered encounter medications on file as of 12/23/2019.    Review of Systems  Constitutional: Negative for activity change, appetite change, chills, diaphoresis, fatigue, fever and unexpected weight change.  Respiratory: Negative for cough, shortness of breath, wheezing and stridor.   Cardiovascular: Positive for leg swelling. Negative for chest pain and palpitations.  Gastrointestinal: Negative for abdominal  distention, abdominal pain, constipation and diarrhea.  Genitourinary: Negative for difficulty urinating, dysuria, flank pain and frequency.  Musculoskeletal: Positive for gait problem. Negative for arthralgias, back pain, joint swelling and myalgias.  Neurological: Negative for dizziness, seizures, syncope, facial asymmetry, speech difficulty, weakness and headaches.  Hematological: Negative for adenopathy. Does not bruise/bleed easily.  Psychiatric/Behavioral: Positive for confusion. Negative for agitation and behavioral problems.    Immunization History  Administered Date(s) Administered  . Influenza-Unspecified 04/17/2014, 05/01/2016, 04/15/2019  . Moderna SARS-COVID-2 Vaccination 07/12/2019, 08/09/2019  . Pneumococcal Conjugate-13 04/20/2014  . Pneumococcal Polysaccharide-23 07/08/2003, 03/31/2011  . Pneumococcal-Unspecified 01/26/2004  . Td 08/23/2007  . Tdap 01/07/2012  . Zoster 07/17/2005  . Zoster Recombinat (Shingrix) 10/05/2017, 12/09/2017   Pertinent  Health Maintenance Due  Topic Date Due  . INFLUENZA VACCINE  02/05/2020  . PNA vac Low Risk Adult  Completed   Fall Risk  10/30/2019 06/23/2019 05/07/2019 05/03/2019  Falls in the past year? - 1 - -  Number falls in past yr: - 1 - -  Injury with Fall? - 1 - -  Risk for fall due to : History of fall(s);Impaired balance/gait;Impaired mobility;Mental status change History of fall(s);Impaired balance/gait;Impaired mobility History of fall(s);Impaired balance/gait;Impaired mobility;Mental status change;Medication side effect History of fall(s);Impaired balance/gait;Impaired mobility;Medication side effect;Mental status change  Follow up Falls evaluation completed;Education provided;Falls prevention discussed Falls evaluation completed Falls evaluation completed;Education provided;Falls prevention discussed Falls evaluation completed;Education provided;Falls prevention discussed  Comment - - [facility] at [facility] all addressed at  [place]:    Vitals:   12/23/19 1200  Weight: 259 lb 12.8 oz (117.8 kg)   Body mass index is 29.27 kg/m.  Wt Readings from Last 3 Encounters:  12/23/19 259 lb 12.8 oz (117.8 kg)  11/21/19 250 lb (113.4 kg)  10/18/19 254 lb (115.2 kg)    Physical Exam Vitals and nursing note reviewed.  Constitutional:      General: He is not in acute distress.    Appearance: He is not diaphoretic.  HENT:     Head: Normocephalic and atraumatic.     Mouth/Throat:     Mouth: Mucous membranes are moist.     Pharynx: Oropharynx is clear. No oropharyngeal exudate.  Neck:     Thyroid: No thyromegaly.     Vascular: No JVD.     Trachea: No tracheal deviation.  Cardiovascular:     Rate and Rhythm: Normal rate. Rhythm irregular.     Heart sounds:  No murmur heard.   Pulmonary:     Effort: Pulmonary effort is normal. No respiratory distress.     Breath sounds: Normal breath sounds. No wheezing.     Comments: No cyanosis.  Abdominal:     General: Bowel sounds are normal. There is no distension.     Palpations: Abdomen is soft.     Tenderness: There is no abdominal tenderness.  Musculoskeletal:     Right lower leg: Edema (+1) present.     Left lower leg: Edema (+1) present.  Lymphadenopathy:     Cervical: No cervical adenopathy.  Skin:    General: Skin is warm and dry.  Neurological:     General: No focal deficit present.     Mental Status: He is alert. Mental status is at baseline.     Cranial Nerves: No cranial nerve deficit.  Psychiatric:        Mood and Affect: Mood normal.     Labs reviewed: Recent Labs    05/01/19 1037 05/19/19 0000 06/02/19 0132 07/28/19 0000 10/06/19 1010 11/14/19 0000 11/17/19 0700 11/17/19 0700 11/21/19 0000 11/21/19 0600 12/08/19 0000  NA 140   < > 138   < > 137   < > 143   < > 144 144 141  141  K 4.4   < > 3.8   < > 4.0   < > 3.9   < > 4.1 4.1 4.8  4.8  CL 103   < > 104   < > 103   < > 104   < > 107 107 105  105    CO2 25   < > 23   < > 25   < > 26*   < > 25* 25* 20  20  GLUCOSE 178*  --  125*  --  104*  --   --   --   --   --   --   BUN 27*   < > 20   < > 19   < > 39*   < > 24* 24* 14  14  CREATININE 1.44*   < > 1.32*   < > 1.01   < > 1.0   < > 0.8 0.8 0.9  0.9  CALCIUM 10.3  --  10.1   < > 9.6   < > 10.7  --   --  10.5 10.6  10.6   < > = values in this interval not displayed.   Recent Labs    03/22/19 0808 05/01/19 1037 05/19/19 0000  AST 17 26 19   ALT 15 21 19   ALKPHOS 73 65 77  BILITOT 1.2 1.5*  --   PROT 6.4* 6.8  --   ALBUMIN 4.1 4.0  --    Recent Labs    05/19/19 0000 06/02/19 0132 06/02/19 0132 07/28/19 0000 07/28/19 0000 10/06/19 1010 11/14/19 0000 11/17/19 0700 11/21/19 1340  WBC   < > 4.1   < > 2.7   < > 2.4* 9.4 3.1 2.7*  NEUTROABS  --  3.0  --  2  --  1.4* 8  --   --   HGB   < > 15.0   < > 13.9   < > 13.7 13.2* 13.5 13.6  HCT   < > 46.2   < > 42   < > 43.9 39* 41 43.6  MCV  --  93.9  --   --   --  94.0  --   --  93.8  PLT   < > 78*   < > 85*   < > 109* 133* 138* 154   < > = values in this interval not displayed.   Lab Results  Component Value Date   TSH 0.84 05/19/2019   Lab Results  Component Value Date   HGBA1C 6 08/22/2019   Lab Results  Component Value Date   CHOL 123 05/19/2019   HDL 30 (A) 05/19/2019   LDLCALC 62 05/19/2019   TRIG 154 05/19/2019    Significant Diagnostic Results in last 30 days:  No results found.  Assessment/Plan 1. Urinary retention due to benign prostatic hyperplasia S/p suprapubic catheter  2. Suprapubic catheter ([place]) [facility] to provide cath changes, urology apt next month Monitor output and for bladder distention/cath patency  3. Chronic diastolic heart failure (HCC) His weight is trending upward, may need to start low dose lasix if this continues. He is doing well at this time with compression hose.   4. Longstanding persistent atrial fibrillation (HCC) Rate is controlled. Continues on coumadin for CVA risk  reduction INR due next week   5. Vascular dementia without behavioral disturbance (HCC) Moderate No longer on medications for this issue and seems to be doing well in the skilled care setting.   6. Thrombocytopenia ([place]) Lab Results  Component Value Date   PLT 154 11/21/2019   Unchanged, note pt is on coumadin   7. Renal mass, right Followed by urology for surveillance  No current symptoms      Labs/tests ordered:  NA

## 2019-12-29 ENCOUNTER — Encounter: Payer: Self-pay | Admitting: Adult Health

## 2019-12-29 ENCOUNTER — Non-Acute Institutional Stay (SKILLED_NURSING_FACILITY): Payer: Medicare Other | Admitting: Adult Health

## 2019-12-29 DIAGNOSIS — Z7901 Long term (current) use of anticoagulants: Secondary | ICD-10-CM | POA: Diagnosis not present

## 2019-12-29 DIAGNOSIS — I4811 Longstanding persistent atrial fibrillation: Secondary | ICD-10-CM | POA: Diagnosis not present

## 2019-12-29 LAB — PROTIME-INR
Protime: 18.7 — AB (ref 10.0–13.8)
Protime: 31 — AB (ref 10.0–13.8)

## 2019-12-29 LAB — POCT INR
INR: 1.6 — AB (ref 0.9–1.1)
INR: 3 — AB (ref 0.9–1.1)

## 2019-12-29 NOTE — Progress Notes (Signed)
This encounter was created in error - please disregard.

## 2019-12-29 NOTE — Progress Notes (Signed)
Location:  Occupational psychologist of Service:  SNF (31) Provider:   Cindi Carbon, ANP [place] [phone]   Gayland Curry, DO  Patient Care Team: Gayland Curry, DO as PCP - General (Geriatric Medicine) [name], MD as PCP - Cardiology (Cardiology)  Extended Emergency Contact Information Primary Emergency Contact: Mcadoo,[name] Address: [address]          [place], [place] [address] [name] of [place] Phone: [phone] Mobile Phone: [phone] Relation: Spouse Secondary Emergency Contact: Henrickson,Lillie Address: [place] [address] [name] of [place] Phone: [phone] Mobile Phone: [phone] Relation: Son  Code Status:  DNR Goals of care: Advanced Directive information Advanced Directives 11/21/2019  Does Patient Have a Medical Advance Directive? Yes  Type of Paramedic of [place];Living will  Does patient want to make changes to medical advance directive? -  Copy of [place] in Chart? No - copy requested  Pre-existing out of facility DNR order (yellow form or pink MOST form) -     Chief Complaint  Patient presents with  . Acute Visit    coumadin management    HPI:  Pt is a 86 y.o. male seen today for an acute visit for coumadin management. He has a hx of longstanding afib and is taking coumadin. No issues with excessive bleeding or bruising are reported. He has a suprapubic cath in place at this time with no current issues.  Lab Results  Component Value Date   INR 1.6 (A) 12/29/2019   INR 2.5 (A) 12/08/2019   INR 3.1 (A) 12/01/2019   PROTIME 18.7 (A) 12/29/2019   PROTIME 27.2 (A) 12/08/2019   PROTIME 31.9 (A) 12/01/2019     Past Medical History:  Diagnosis Date  . Adult failure to thrive    Per incoming records from [facility]  . Allergic rhinitis    Per incoming records from  [facility]  . Atrial fibrillation ([place])   . BPH (benign prostatic hyperplasia)    Per incoming records from [facility]  . Cancer of kidney [facility])    Right, Per incoming records from [facility]  . Cataract    Per incoming records from [facility]  . Cerebrovascular accident, late effects    Per incoming records from [facility]  . Chronic kidney disease, stage III (moderate)    Per incoming records from [facility]  . Claudication [facility])    Per incoming records from [facility]  . Cognitive impairment    Mild, Per incoming records from [facility]  . Constipation    Per incoming records from [facility]  . Dementia ([place])   . Diarrhea    Better off Aricept, Per incoming records from [facility]  . Diastolic dysfunction    on 2D Echo 07/2009 and 2016, Per incoming records from [facility]  . Diverticulosis    Mild, Per incoming records from [facility]  . Diverticulosis of colon (without mention of hemorrhage)   . ED (erectile dysfunction)    Per incoming records from [facility]  . History of Coumadin therapy    Per incoming records from [facility]  . History of echocardiogram 09/2014   Per incoming records from [facility]  . Hyperlipemia   . Hypertension   .  Kidney mass    Per incoming records from [facility]  . Lower leg edema    Per incoming records from [facility]  . Melanoma ([place])   . Neuropathy    Per incoming records from [facility]  . OA (osteoarthritis)    Per incoming records from [facility]  . Peripheral neuropathy    Per incoming records from [facility]  . Personal history of colonic polyps 1999 & 2004   adenomatous polyps  . Pulmonary  arterial hypertension ([place])    Per incoming records from [facility]  . Rosacea    Per incoming records from [facility]  . Thrombocytopenia ([place]) 2017   Per incoming records from [facility]  . UTI (urinary tract infection)    Sepsis, Per incoming records from [facility]  . Ventricular hypertrophy   . Walker as ambulation aid    Per incoming records from [employer]   Past Surgical History:  Procedure Laterality Date  . APPENDECTOMY     Per incoming records from [facility]  . CATARACT EXTRACTION     Per incoming records from [facility]  . IR CATHETER TUBE CHANGE  11/21/2019  . KNEE SURGERY     right  . PILONIDAL CYST DRAINAGE    . POLYPECTOMY     Per incoming records from [facility]  . schwanoma tumor lumbar spine    . SPINE SURGERY     tumor removed  . THORACIC 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, Per incoming records from [facility]  . TONSILLECTOMY AND ADENOIDECTOMY      Allergies  Allergen Reactions  . Penicillins Rash    Has patient had a PCN reaction causing immediate rash, facial/tongue/throat swelling, SOB or lightheadedness with hypotension: unknown Has patient had a PCN reaction causing severe rash involving mucus membranes or skin necrosis: unknown Has patient had a PCN reaction that required hospitalization : unknown Has patient had a PCN reaction occurring within the last 10 years: no If all of the above answers are "NO", then may proceed with Cephalosporin use.     Outpatient Encounter Medications as of 12/29/2019  Medication Sig  . acetaminophen (TYLENOL) 500 MG tablet Take 500 mg by mouth 2 (two) times daily. Scheduled and every 4 hours as needed (ending 05/05/2019)Document area of discomfort and effectiveness of medication.  Marland Kitchen atorvastatin (LIPITOR) 20 MG tablet Take 20 mg by mouth  every evening.   . cholecalciferol (VITAMIN D3) 25 MCG (1000 UT) tablet Take 2,000 Units by mouth daily.   . DULoxetine (CYMBALTA) 30 MG capsule Take 30 mg by mouth daily.  . methenamine (MANDELAMINE) 1 g tablet Take 1,000 mg by mouth 2 (two) times daily.  . polyethylene glycol (MIRALAX / GLYCOLAX) 17 g packet Take 17 g by mouth daily.  Marland Kitchen warfarin (COUMADIN) 4 MG tablet 4 mg every other day. On tues, thurs, and saturday  . warfarin (COUMADIN) 5 MG tablet Take 5 mg by mouth every other day. 5 mg on Monday, Wed, Friday, and Sunday  . triamcinolone lotion (KENALOG) 0.1 % Apply 1 application topically 2 (two) times daily as needed.    No facility-administered encounter medications on file as of 12/29/2019.    Review of Systems  Musculoskeletal: Positive for gait problem.  Hematological: Does not bruise/bleed easily.  Psychiatric/Behavioral: Positive for confusion.    Immunization History  Administered Date(s) Administered  .  Influenza-Unspecified 04/17/2014, 05/01/2016, 04/15/2019  . Moderna SARS-COVID-2 Vaccination 07/12/2019, 08/09/2019  . Pneumococcal Conjugate-13 04/20/2014  . Pneumococcal Polysaccharide-23 07/08/2003, 03/31/2011  . Pneumococcal-Unspecified 01/26/2004  . Td 08/23/2007  . Tdap 01/07/2012  . Zoster 07/17/2005  . Zoster Recombinat (Shingrix) 10/05/2017, 12/09/2017   Pertinent  Health Maintenance Due  Topic Date Due  . INFLUENZA VACCINE  02/05/2020  . PNA vac Low Risk Adult  Completed   Fall Risk  10/30/2019 06/23/2019 05/07/2019 05/03/2019  Falls in the past year? - 1 - -  Number falls in past yr: - 1 - -  Injury with Fall? - 1 - -  Risk for fall due to : History of fall(s);Impaired balance/gait;Impaired mobility;Mental status change History of fall(s);Impaired balance/gait;Impaired mobility History of fall(s);Impaired balance/gait;Impaired mobility;Mental status change;Medication side effect History of fall(s);Impaired balance/gait;Impaired mobility;Medication side  effect;Mental status change  Follow up Falls evaluation completed;Education provided;Falls prevention discussed Falls evaluation completed Falls evaluation completed;Education provided;Falls prevention discussed Falls evaluation completed;Education provided;Falls prevention discussed  Comment - [facility] [place]:    There were no vitals filed for this visit. There is no height or weight on file to calculate BMI. Physical Exam Vitals reviewed.  Neurological:     Mental Status: He is alert.     Labs reviewed: Recent Labs    05/01/19 1037 05/19/19 0000 06/02/19 0132 07/28/19 0000 10/06/19 1010 11/14/19 0000 11/17/19 0700 11/17/19 0700 11/21/19 0000 11/21/19 0600 12/08/19 0000  NA 140   < > 138   < > 137   < > 143   < > 144 144 141  141  K 4.4   < > 3.8   < > 4.0   < > 3.9   < > 4.1 4.1 4.8  4.8  CL 103   < > 104   < > 103   < > 104   < > 107 107 105  105  CO2 25   < > 23   < > 25   < > 26*   < > 25* 25* 20  20  GLUCOSE 178*  --  125*  --  104*  --   --   --   --   --   --   BUN 27*   < > 20   < > 19   < > 39*   < > 24* 24* 14  14  CREATININE 1.44*   < > 1.32*   < > 1.01   < > 1.0   < > 0.8 0.8 0.9  0.9  CALCIUM 10.3  --  10.1   < > 9.6   < > 10.7  --   --  10.5 10.6  10.6   < > = values in this interval not displayed.   Recent Labs    03/22/19 0808 05/01/19 1037 05/19/19 0000  AST 17 26 19   ALT 15 21 19   ALKPHOS 73 65 77  BILITOT 1.2 1.5*  --   PROT 6.4* 6.8  --   ALBUMIN 4.1 4.0  --    Recent Labs    05/19/19 0000 06/02/19 0132 06/02/19 0132 07/28/19 0000 07/28/19 0000 10/06/19 1010 11/14/19 0000 11/17/19 0700 11/21/19 1340  WBC   < > 4.1   < > 2.7   < > 2.4* 9.4 3.1 2.7*  NEUTROABS  --  3.0  --  2  --  1.4* 8  --   --  HGB   < > 15.0   < > 13.9   < > 13.7 13.2* 13.5 13.6  HCT   < > 46.2   < > 42   < > 43.9 39* 41 43.6  MCV  --  93.9  --   --   --  94.0  --   --  93.8  PLT   < > 78*    < > 85*   < > 109* 133* 138* 154   < > = values in this interval not displayed.   Lab Results  Component Value Date   TSH 0.84 05/19/2019   Lab Results  Component Value Date   HGBA1C 6 08/22/2019   Lab Results  Component Value Date   CHOL 123 05/19/2019   HDL 30 (A) 05/19/2019   LDLCALC 62 05/19/2019   TRIG 154 05/19/2019    Significant Diagnostic Results in last 30 days:  No results found.  Assessment/Plan 1. Chronic anticoagulation Increase Coumadin to 5 mg M, W, F, and Sun. 4 gm Tues and Thurs, and Sat.  Recheck INR in 1 week   2. Longstanding persistent atrial fibrillation (HCC) CHA2DS2-VASc score 6 Rate is controlled, coumadin is for CVA risk reduction     Family/ staff Communication: staff  Labs/tests ordered:  INR in 1 week

## 2020-01-19 DIAGNOSIS — I482 Chronic atrial fibrillation, unspecified: Secondary | ICD-10-CM | POA: Diagnosis not present

## 2020-01-19 LAB — PROTIME-INR: Protime: 25.7 — AB (ref 10.0–13.8)

## 2020-01-19 LAB — POCT INR: INR: 2.2 — AB (ref <=1.1)

## 2020-01-25 DIAGNOSIS — D485 Neoplasm of uncertain behavior of skin: Secondary | ICD-10-CM | POA: Diagnosis not present

## 2020-01-25 DIAGNOSIS — L814 Other melanin hyperpigmentation: Secondary | ICD-10-CM | POA: Diagnosis not present

## 2020-01-25 DIAGNOSIS — L570 Actinic keratosis: Secondary | ICD-10-CM | POA: Diagnosis not present

## 2020-02-02 ENCOUNTER — Non-Acute Institutional Stay (SKILLED_NURSING_FACILITY): Payer: Medicare Other | Admitting: Adult Health

## 2020-02-02 ENCOUNTER — Encounter: Payer: Self-pay | Admitting: Adult Health

## 2020-02-02 DIAGNOSIS — I5032 Chronic diastolic (congestive) heart failure: Secondary | ICD-10-CM

## 2020-02-02 DIAGNOSIS — I4811 Longstanding persistent atrial fibrillation: Secondary | ICD-10-CM

## 2020-02-02 DIAGNOSIS — I482 Chronic atrial fibrillation, unspecified: Secondary | ICD-10-CM | POA: Diagnosis not present

## 2020-02-02 DIAGNOSIS — I739 Peripheral vascular disease, unspecified: Secondary | ICD-10-CM

## 2020-02-02 DIAGNOSIS — I10 Essential (primary) hypertension: Secondary | ICD-10-CM | POA: Diagnosis not present

## 2020-02-02 DIAGNOSIS — L570 Actinic keratosis: Secondary | ICD-10-CM | POA: Diagnosis not present

## 2020-02-02 DIAGNOSIS — N401 Enlarged prostate with lower urinary tract symptoms: Secondary | ICD-10-CM

## 2020-02-02 DIAGNOSIS — R338 Other retention of urine: Secondary | ICD-10-CM

## 2020-02-02 DIAGNOSIS — Z7901 Long term (current) use of anticoagulants: Secondary | ICD-10-CM | POA: Diagnosis not present

## 2020-02-02 LAB — POCT INR: INR: 1.9 — AB (ref <=1.1)

## 2020-02-03 NOTE — Progress Notes (Signed)
Location:  Occupational psychologist of Service:  SNF (31) Provider:   Cindi Carbon, ANP [place] [phone]  Peter Curry, DO  Patient Care Team: Peter Curry, DO as PCP - General (Geriatric Medicine) [name], MD as PCP - Cardiology (Cardiology)  Extended Emergency Contact Information Primary Emergency Contact: Flight,[name] Address: [address]          [place], [place] [address] Peter Singh Phone: [phone] Mobile Phone: [phone] Relation: Spouse Secondary Emergency Contact: Singh,Peter Address: [place] [address] Peter Litter of [place] Phone: [phone] Mobile Phone: [phone] Relation: Son  Code Status:  DNR Goals of care: Advanced Directive information Advanced Directives 11/21/2019  Does Patient Have a Medical Advance Directive? Yes  Type of Paramedic of [place];Living will  Does patient want to make changes to medical advance directive? -  Copy of Peter Singh in Chart? No - copy requested  Pre-existing out of facility DNR order (yellow form or pink MOST form) -     Chief Complaint  Patient presents with  . Medical Management of Chronic Issues    HPI:  Pt is a 86 y.o. male seen today for medical management of chronic diseases.   [name] reports that he is doing well and appreciates the care at [facility]. He remains ambulatory and participates in activities. In review of the notes he has AKs to his scalp and right cheek. He was offered treatment by the dermatologist but his wife declined at this time.   Afib on chronic coumadin: no current s/e. Rate is controlled.  Lab Results  Component Value Date   INR 1.9 (A) 02/02/2020   INR 2.2 (A) 01/19/2020   INR 1.6 (A) 12/29/2019   INR 3.0 (A) 12/29/2019   PROTIME 25.7 (A) 01/19/2020   PROTIME 18.7 (A) 12/29/2019   PROTIME 31.0 (A) 12/29/2019    PVD: no pain reported in his legs. No wounds. BP controlled.  08/18/19: ABI Right lower ext: WNL 1.05 Left lower ext: moderate arterial disease 0.62 Lab Results  Component Value Date   LDLCALC 62 05/19/2019   Has a s/p cath that is draining well due to prior hx of urinary retention associated with BPH. No current urinary symptoms or fever are noted.    BP controlled off hctz due to elevated Calcium and low bp  CHF hx of diastolic: carries edema in his legs and wears compression hose. No change in weight or edema. No sob or cp, no doe or pnd.  Past Medical History:  Diagnosis Date  . Adult failure to thrive    Per incoming records from [facility]  . Allergic rhinitis    Per incoming records from [facility]  . Atrial fibrillation ([place])   . BPH (benign prostatic hyperplasia)    Per incoming records from [facility]  . Cancer of kidney [facility])    Right, Per incoming records from [facility]  . Cataract    Per incoming records from [facility]  . Cerebrovascular accident, late effects    Per incoming records from [facility]  . Chronic kidney disease, stage III (moderate)    Per incoming records from [facility]  . Claudication [facility])    Per incoming records from [facility]  . Cognitive impairment    Mild, Per incoming  records from [facility]  . Constipation    Per incoming records from [facility]  . Dementia ([place])   . Diarrhea    Better off Aricept, Per incoming records from [facility]  . Diastolic dysfunction    on 2D Echo 07/2009 and 2016, Per incoming records from [facility]  . Diverticulosis    Mild, Per incoming records from [facility]  . Diverticulosis of colon (without mention of hemorrhage)   . ED (erectile dysfunction)    Per incoming records from  [facility]  . History of Coumadin therapy    Per incoming records from [facility]  . History of echocardiogram 09/2014   Per incoming records from [facility]  . Hyperlipemia   . Hypertension   . Kidney mass    Per incoming records from [facility]  . Lower leg edema    Per incoming records from [facility]  . Melanoma ([place])   . Neuropathy    Per incoming records from [facility]  . OA (osteoarthritis)    Per incoming records from [facility]  . Peripheral neuropathy    Per incoming records from [facility]  . Personal history of colonic polyps 1999 & 2004   adenomatous polyps  . Pulmonary arterial hypertension ([place])    Per incoming records from [facility]  . Rosacea    Per incoming records from [facility]  . Thrombocytopenia ([place]) 2017   Per incoming records from [facility]  . UTI (urinary tract infection)    Sepsis, Per incoming records from [facility]  . Ventricular hypertrophy   . Walker as ambulation aid    Per incoming records from [employer] [facility]   Past Surgical History:  Procedure Laterality Date  . APPENDECTOMY     Per incoming records from [facility]  . CATARACT EXTRACTION     Per incoming records from [facility]  . IR CATHETER TUBE CHANGE  11/21/2019  . KNEE SURGERY     right  . PILONIDAL CYST DRAINAGE    . POLYPECTOMY     Per incoming records from [facility]  . schwanoma tumor lumbar spine    . SPINE SURGERY     tumor removed  . THORACIC 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, Per incoming records from [facility]  . TONSILLECTOMY AND ADENOIDECTOMY      Allergies  Allergen Reactions  . Penicillins Rash    Has patient had a PCN reaction causing immediate rash,  facial/tongue/throat swelling, SOB or lightheadedness with hypotension: unknown Has patient had a PCN reaction causing severe rash involving mucus membranes or skin necrosis: unknown Has patient had a PCN reaction that required hospitalization : unknown Has patient had a PCN reaction occurring within the last 10 years: no If all of the above answers are "NO", then may proceed with Cephalosporin use.     Outpatient Encounter Medications as of 02/02/2020  Medication Sig  . acetaminophen (TYLENOL) 500 MG tablet Take 500 mg by mouth 2 (two) times daily. Scheduled and every 4 hours as needed (ending 05/05/2019)Document area of discomfort and effectiveness of medication.  Marland Kitchen atorvastatin (LIPITOR) 20 MG tablet Take 20 mg by mouth every evening.   . cholecalciferol (VITAMIN D3) 25 MCG (1000 UT) tablet Take 2,000 Units by mouth daily.   . DULoxetine (CYMBALTA) 30 MG capsule Take  30 mg by mouth daily.  . methenamine (MANDELAMINE) 1 g tablet Take 1,000 mg by mouth 2 (two) times daily.  . mineral oil-hydrophilic petrolatum (AQUAPHOR) ointment Apply topically daily. Right cheek and scalp  . polyethylene glycol (MIRALAX / GLYCOLAX) 17 g packet Take 17 g by mouth daily.  Marland Kitchen triamcinolone lotion (KENALOG) 0.1 % Apply 1 application topically 2 (two) times daily as needed.   . warfarin (COUMADIN) 5 MG tablet Take 5 mg by mouth daily.   . [DISCONTINUED] warfarin (COUMADIN) 4 MG tablet 4 mg every other day. On tues, thurs, and saturday   No facility-administered encounter medications on file as of 02/02/2020.    Review of Systems  Constitutional: Negative for activity change, appetite change, chills, diaphoresis, fatigue, fever and unexpected weight change.  Respiratory: Negative for cough, shortness of breath, wheezing and stridor.   Cardiovascular: Positive for leg swelling. Negative for chest pain and palpitations.  Gastrointestinal: Negative for abdominal distention, abdominal pain, constipation and  diarrhea.  Genitourinary: Negative for difficulty urinating and dysuria.  Musculoskeletal: Positive for gait problem. Negative for arthralgias, back pain, joint swelling and myalgias.  Neurological: Negative for dizziness, seizures, syncope, facial asymmetry, speech difficulty, weakness and headaches.  Hematological: Negative for adenopathy. Does not bruise/bleed easily.  Psychiatric/Behavioral: Positive for confusion. Negative for agitation and behavioral problems.    Immunization History  Administered Date(s) Administered  . Influenza-Unspecified 04/17/2014, 05/01/2016, 04/15/2019  . Moderna SARS-COVID-2 Vaccination 07/12/2019, 08/09/2019  . Pneumococcal Conjugate-13 04/20/2014  . Pneumococcal Polysaccharide-23 07/08/2003, 03/31/2011  . Pneumococcal-Unspecified 01/26/2004  . Td 08/23/2007  . Tdap 01/07/2012  . Zoster 07/17/2005  . Zoster Recombinat (Shingrix) 10/05/2017, 12/09/2017   Pertinent  Health Maintenance Due  Topic Date Due  . INFLUENZA VACCINE  02/05/2020  . PNA vac Low Risk Adult  Completed   Fall Risk  10/30/2019 06/23/2019 05/07/2019 05/03/2019  Falls in the past year? - 1 - -  Number falls in past yr: - 1 - -  Injury with Fall? - 1 - -  Risk for fall due to : History of fall(s);Impaired balance/gait;Impaired mobility;Mental status change History of fall(s);Impaired balance/gait;Impaired mobility History of fall(s);Impaired balance/gait;Impaired mobility;Mental status change;Medication side effect History of fall(s);Impaired balance/gait;Impaired mobility;Medication side effect;Mental status change  Follow up Falls evaluation completed;Education provided;Falls prevention discussed Falls evaluation completed Falls evaluation completed;Education provided;Falls prevention discussed Falls evaluation completed;Education provided;Falls prevention discussed  Comment - [facility] at [place]:    There were no vitals  filed for this visit. There is no height or weight on file to calculate BMI. Physical Exam Vitals and nursing note reviewed.  Constitutional:      General: He is not in acute distress.    Appearance: He is not diaphoretic.  HENT:     Head: Normocephalic and atraumatic.     Mouth/Throat:     Mouth: Mucous membranes are moist.     Pharynx: Oropharynx is clear.  Neck:     Thyroid: No thyromegaly.     Vascular: No JVD.     Trachea: No tracheal deviation.  Cardiovascular:     Rate and Rhythm: Normal rate. Rhythm irregular.     Heart sounds: No murmur heard.   Pulmonary:     Effort: Pulmonary effort is normal. No respiratory distress.     Breath sounds: Normal breath sounds. No wheezing.  Abdominal:     General: Bowel sounds are normal. There is no distension.     Palpations: Abdomen  is soft.     Tenderness: There is no abdominal tenderness.  Musculoskeletal:     Right lower leg: Edema (+1) present.     Left lower leg: Edema (+1) present.  Lymphadenopathy:     Cervical: No cervical adenopathy.  Skin:    General: Skin is warm and dry.     Comments: AK noted to right cheek and scalp  Neurological:     General: No focal deficit present.     Mental Status: He is alert. Mental status is at baseline.     Comments: Oriented x 2. MAE  Psychiatric:        Mood and Affect: Mood normal.     Labs reviewed: Recent Labs    05/01/19 1037 05/19/19 0000 06/02/19 0132 07/28/19 0000 10/06/19 1010 11/14/19 0000 11/17/19 0000 11/17/19 0700 11/21/19 0000 11/21/19 0600 12/08/19 0000  NA 140   < > 138   < > 137   < >   < >  --  144 144 141  141  K 4.4   < > 3.8   < > 4.0   < >   < >  --  4.1 4.1 4.8  4.8  CL 103   < > 104   < > 103   < >   < >  --  107 107 105  105  CO2 25   < > 23   < > 25   < >   < >  --  25* 25* 20  20  GLUCOSE 178*  --  125*  --  104*  --   --   --   --   --   --   BUN 27*   < > 20   < > 19   < >   < >  --  24* 24* 14  14  CREATININE 1.44*   < > 1.32*   < >  1.01   < >   < >  --  0.8 0.8 0.9  0.9  CALCIUM 10.3  --  10.1   < > 9.6   < >  --  10.7  --  10.5 10.6  10.6   < > = values in this interval not displayed.   Recent Labs    03/22/19 0808 05/01/19 1037 05/19/19 0000  AST 17 26 19   ALT 15 21 19   ALKPHOS 73 65 77  BILITOT 1.2 1.5*  --   PROT 6.4* 6.8  --   ALBUMIN 4.1 4.0  --    Recent Labs    05/19/19 0000 06/02/19 0132 06/02/19 0132 07/28/19 0000 07/28/19 0000 10/06/19 1010 11/14/19 0000 11/17/19 0700 11/21/19 1340  WBC   < > 4.1   < > 2.7   < > 2.4* 9.4 3.1 2.7*  NEUTROABS  --  3.0  --  2  --  1.4* 8  --   --   HGB   < > 15.0   < > 13.9   < > 13.7 13.2* 13.5 13.6  HCT   < > 46.2   < > 42   < > 43.9 39* 41 43.6  MCV  --  93.9  --   --   --  94.0  --   --  93.8  PLT   < > 78*   < > 85*   < > 109* 133* 138* 154   < > = values in this interval not displayed.  Lab Results  Component Value Date   TSH 0.84 05/19/2019   Lab Results  Component Value Date   HGBA1C 6 08/22/2019   Lab Results  Component Value Date   CHOL 123 05/19/2019   HDL 30 (A) 05/19/2019   LDLCALC 62 05/19/2019   TRIG 154 05/19/2019    Significant Diagnostic Results in last 30 days:  No results found.  Assessment/Plan 1. Longstanding persistent atrial fibrillation (HCC) Rate is controlled without medication   2. Chronic anticoagulation Continue coumadin 5 mg qd and repeat INR in two weeks   3. Peripheral vascular disease ([place]) Continues on lipitor therapy. No wounds or other issues.   4. Chronic diastolic heart failure ([place]) Appears well compensated without diuretic therapy.   5. Essential hypertension Controlled off hctz  6. Urinary retention due to benign prostatic hyperplasia Continues with s/p cath managed by the nursing staff at [facility] [place] to monitor for symptoms of infection or obstruction   7. AK to scalp and cheek right  Continue aquaphor recommended by the dermatologist.    Family/ staff Communication:  resident and his nurse  Labs/tests ordered:  INR in two weeks

## 2020-02-06 ENCOUNTER — Encounter: Payer: Self-pay | Admitting: Adult Health

## 2020-02-06 ENCOUNTER — Non-Acute Institutional Stay (SKILLED_NURSING_FACILITY): Payer: Medicare Other | Admitting: Adult Health

## 2020-02-06 DIAGNOSIS — Z7901 Long term (current) use of anticoagulants: Secondary | ICD-10-CM

## 2020-02-06 DIAGNOSIS — I4811 Longstanding persistent atrial fibrillation: Secondary | ICD-10-CM

## 2020-02-06 LAB — POCT INR: INR: 1.3 — AB (ref <=1.1)

## 2020-02-06 NOTE — Progress Notes (Signed)
Location:  Occupational psychologist of Service:  SNF (31) Provider:   Cindi Carbon, ANP [place] [phone]   Gayland Curry, DO  Patient Care Team: Gayland Curry, DO as PCP - General (Geriatric Medicine) [name], MD as PCP - Cardiology (Cardiology)  Extended Emergency Contact Information Primary Emergency Contact: Fresquez,[name] Address: [address]          [place], [place] [address] [name] of [place] Phone: [phone] Mobile Phone: [phone] Relation: Spouse Secondary Emergency Contact: Paden,Saifullah Address: [place] [address] [name] of [place] Phone: [phone] Mobile Phone: [phone] Relation: Son  Code Status:  DNR Goals of care: Advanced Directive information Advanced Directives 11/21/2019  Does Patient Have a Medical Advance Directive? Yes  Type of Paramedic of [place];Living will  Does patient want to make changes to medical advance directive? -  Copy of [place] in Chart? No - copy requested  Pre-existing out of facility DNR order (yellow form or pink MOST form) -     Chief Complaint  Patient presents with  . Acute Visit    coumadin management    HPI:  Pt is a 86 y.o. male seen today for an acute visit for coumadin management. He has a hx of longstanding afib and is taking coumadin. No issues with excessive bleeding or bruising are reported.Unfortunatley Mr. Ferrante missed 3 days of coumadin. There are no current symptoms of stroke or otherwise reported.  Lab Results  Component Value Date   INR 1.3 (A) 02/06/2020   INR 1.9 (A) 02/02/2020   INR 2.2 (A) 01/19/2020   PROTIME 25.7 (A) 01/19/2020   PROTIME 18.7 (A) 12/29/2019   PROTIME 31.0 (A) 12/29/2019     Past Medical History:  Diagnosis Date  . Adult failure to thrive    Per incoming records from [facility]  . Allergic  rhinitis    Per incoming records from [facility]  . Atrial fibrillation ([place])   . BPH (benign prostatic hyperplasia)    Per incoming records from [facility]  . Cancer of kidney [facility])    Right, Per incoming records from [facility]  . Cataract    Per incoming records from [facility]  . Cerebrovascular accident, late effects    Per incoming records from [facility]  . Chronic kidney disease, stage III (moderate)    Per incoming records from [facility]  . Claudication [facility])    Per incoming records from [facility]  . Cognitive impairment    Mild, Per incoming records from [facility]  . Constipation    Per incoming records from [facility]  . Dementia ([place])   . Diarrhea    Better off Aricept, Per incoming records from [facility]  . Diastolic dysfunction    on 2D Echo 07/2009 and 2016, Per incoming records from [facility]  . Diverticulosis    Mild, Per incoming records from [facility]  . Diverticulosis of colon (without mention of hemorrhage)   . ED (erectile dysfunction)    Per incoming records from [facility]  . History of Coumadin therapy    Per incoming records from [facility]  . History of echocardiogram 09/2014   Per incoming records from [facility]  . Hyperlipemia   .  Hypertension   . Kidney mass    Per incoming records from [facility]  . Lower leg edema    Per incoming records from [facility]  . Melanoma ([place])   . Neuropathy    Per incoming records from [facility]  . OA (osteoarthritis)    Per incoming records from [facility]  . Peripheral neuropathy    Per incoming records from [facility]  . Personal history of colonic polyps 1999 & 2004     adenomatous polyps  . Pulmonary arterial hypertension ([place])    Per incoming records from [facility]  . Rosacea    Per incoming records from [facility]  . Thrombocytopenia ([place]) 2017   Per incoming records from [facility]  . UTI (urinary tract infection)    Sepsis, Per incoming records from [facility]  . Ventricular hypertrophy   . Walker as ambulation aid    Per incoming records from [employer]   Past Surgical History:  Procedure Laterality Date  . APPENDECTOMY     Per incoming records from [facility]  . CATARACT EXTRACTION     Per incoming records from [facility]  . IR CATHETER TUBE CHANGE  11/21/2019  . KNEE SURGERY     right  . PILONIDAL CYST DRAINAGE    . POLYPECTOMY     Per incoming records from [facility]  . schwanoma tumor lumbar spine    . SPINE SURGERY     tumor removed  . THORACIC 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, Per incoming records from [facility]  . TONSILLECTOMY AND ADENOIDECTOMY      Allergies  Allergen Reactions  . Penicillins Rash    Has patient had a PCN reaction causing immediate rash, facial/tongue/throat swelling, SOB or lightheadedness with hypotension: unknown Has patient had a PCN reaction causing severe rash involving mucus membranes or skin necrosis: unknown Has patient had a PCN reaction that required hospitalization : unknown Has patient had a PCN reaction occurring within the last 10 years: no If all of the above answers are "NO", then may proceed with Cephalosporin use.     Outpatient Encounter Medications as of 02/06/2020  Medication Sig  . acetaminophen (TYLENOL) 500 MG tablet Take 500 mg by mouth 2 (two) times daily. Scheduled and every 4 hours as needed (ending 05/05/2019)Document area of discomfort and effectiveness of medication.  Marland Kitchen atorvastatin (LIPITOR)  20 MG tablet Take 20 mg by mouth every evening.   . cholecalciferol (VITAMIN D3) 25 MCG (1000 UT) tablet Take 2,000 Units by mouth daily.   . DULoxetine (CYMBALTA) 30 MG capsule Take 30 mg by mouth daily.  . methenamine (MANDELAMINE) 1 g tablet Take 1,000 mg by mouth 2 (two) times daily.  . mineral oil-hydrophilic petrolatum (AQUAPHOR) ointment Apply topically daily. Right cheek and scalp  . polyethylene glycol (MIRALAX / GLYCOLAX) 17 g packet Take 17 g by mouth daily.  Marland Kitchen triamcinolone lotion (KENALOG) 0.1 % Apply 1 application topically 2 (two) times daily as needed.   . warfarin (COUMADIN) 5 MG tablet Take 5 mg by mouth daily.    No facility-administered encounter medications on file as of 02/06/2020.    Review of Systems  Musculoskeletal: Positive for gait problem.  Hematological: Does not bruise/bleed easily.  Psychiatric/Behavioral: Positive for confusion.    Immunization History  Administered Date(s) Administered  . Influenza-Unspecified 04/17/2014, 05/01/2016, 04/15/2019  .  Moderna SARS-COVID-2 Vaccination 07/12/2019, 08/09/2019  . Pneumococcal Conjugate-13 04/20/2014  . Pneumococcal Polysaccharide-23 07/08/2003, 03/31/2011  . Pneumococcal-Unspecified 01/26/2004  . Td 08/23/2007  . Tdap 01/07/2012  . Zoster 07/17/2005  . Zoster Recombinat (Shingrix) 10/05/2017, 12/09/2017   Pertinent  Health Maintenance Due  Topic Date Due  . INFLUENZA VACCINE  02/05/2020  . PNA vac Low Risk Adult  Completed   Fall Risk  10/30/2019 06/23/2019 05/07/2019 05/03/2019  Falls in the past year? - 1 - -  Number falls in past yr: - 1 - -  Injury with Fall? - 1 - -  Risk for fall due to : History of fall(s);Impaired balance/gait;Impaired mobility;Mental status change History of fall(s);Impaired balance/gait;Impaired mobility History of fall(s);Impaired balance/gait;Impaired mobility;Mental status change;Medication side effect History of fall(s);Impaired balance/gait;Impaired mobility;Medication side  effect;Mental status change  Follow up Falls evaluation completed;Education provided;Falls prevention discussed Falls evaluation completed Falls evaluation completed;Education provided;Falls prevention discussed Falls evaluation completed;Education provided;Falls prevention discussed  Comment - - [facility] all addressed at [place]:    There were no vitals filed for this visit. There is no height or weight on file to calculate BMI. Physical Exam Vitals reviewed.  Neurological:     Mental Status: He is alert.     Labs reviewed: Recent Labs    05/01/19 1037 05/19/19 0000 06/02/19 0132 07/28/19 0000 10/06/19 1010 11/14/19 0000 11/17/19 0000 11/17/19 0700 11/21/19 0000 11/21/19 0600 12/08/19 0000  NA 140   < > 138   < > 137   < >   < >  --  144 144 141  141  K 4.4   < > 3.8   < > 4.0   < >   < >  --  4.1 4.1 4.8  4.8  CL 103   < > 104   < > 103   < >   < >  --  107 107 105  105  CO2 25   < > 23   < > 25   < >   < >  --  25* 25* 20  20  GLUCOSE 178*  --  125*  --  104*  --   --   --   --   --   --   BUN 27*   < > 20   < > 19   < >   < >  --  24* 24* 14  14  CREATININE 1.44*   < > 1.32*   < > 1.01   < >   < >  --  0.8 0.8 0.9  0.9  CALCIUM 10.3  --  10.1   < > 9.6   < >  --  10.7  --  10.5 10.6  10.6   < > = values in this interval not displayed.   Recent Labs    03/22/19 0808 05/01/19 1037 05/19/19 0000  AST 17 26 19   ALT 15 21 19   ALKPHOS 73 65 77  BILITOT 1.2 1.5*  --   PROT 6.4* 6.8  --   ALBUMIN 4.1 4.0  --    Recent Labs    05/19/19 0000 06/02/19 0132 06/02/19 0132 07/28/19 0000 07/28/19 0000 10/06/19 1010 11/14/19 0000 11/17/19 0700 11/21/19 1340  WBC   < > 4.1   < > 2.7   < > 2.4* 9.4 3.1 2.7*  NEUTROABS  --  3.0  --  2  --  1.4* 8  --   --  HGB   < > 15.0   < > 13.9   < > 13.7 13.2* 13.5 13.6  HCT   < > 46.2   < > 42   < > 43.9 39* 41 43.6  MCV  --  93.9  --   --   --  94.0  --   --  93.8  PLT   < >  78*   < > 85*   < > 109* 133* 138* 154   < > = values in this interval not displayed.   Lab Results  Component Value Date   TSH 0.84 05/19/2019   Lab Results  Component Value Date   HGBA1C 6 08/22/2019   Lab Results  Component Value Date   CHOL 123 05/19/2019   HDL 30 (A) 05/19/2019   LDLCALC 62 05/19/2019   TRIG 154 05/19/2019    Significant Diagnostic Results in last 30 days:  No results found.  Assessment/Plan 1. Chronic anticoagulation Give Coumadin 7.5 mg today 8/2 and tomorrow 8/3, then resume 5 mg qd then recheck INR in 1 week   2. Longstanding persistent atrial fibrillation (HCC) CHA2DS2-VASc score 6 Rate is controlled, coumadin is for CVA risk reduction     Family/ staff Communication: staff  Labs/tests ordered:  INR in 1 week

## 2020-02-09 DIAGNOSIS — Z85828 Personal history of other malignant neoplasm of skin: Secondary | ICD-10-CM | POA: Diagnosis not present

## 2020-02-09 DIAGNOSIS — D0439 Carcinoma in situ of skin of other parts of face: Secondary | ICD-10-CM | POA: Diagnosis not present

## 2020-02-09 DIAGNOSIS — L821 Other seborrheic keratosis: Secondary | ICD-10-CM | POA: Diagnosis not present

## 2020-02-09 DIAGNOSIS — L570 Actinic keratosis: Secondary | ICD-10-CM | POA: Diagnosis not present

## 2020-02-09 DIAGNOSIS — D1801 Hemangioma of skin and subcutaneous tissue: Secondary | ICD-10-CM | POA: Diagnosis not present

## 2020-02-09 DIAGNOSIS — C44612 Basal cell carcinoma of skin of right upper limb, including shoulder: Secondary | ICD-10-CM | POA: Diagnosis not present

## 2020-02-09 DIAGNOSIS — Z85820 Personal history of malignant melanoma of skin: Secondary | ICD-10-CM | POA: Diagnosis not present

## 2020-02-13 DIAGNOSIS — I482 Chronic atrial fibrillation, unspecified: Secondary | ICD-10-CM | POA: Diagnosis not present

## 2020-02-13 DIAGNOSIS — Z7901 Long term (current) use of anticoagulants: Secondary | ICD-10-CM | POA: Diagnosis not present

## 2020-02-17 DIAGNOSIS — Z9189 Other specified personal risk factors, not elsewhere classified: Secondary | ICD-10-CM | POA: Diagnosis not present

## 2020-02-20 DIAGNOSIS — Z7901 Long term (current) use of anticoagulants: Secondary | ICD-10-CM | POA: Diagnosis not present

## 2020-02-20 LAB — PROTIME-INR: Protime: 23.1 — AB (ref 10.0–13.8)

## 2020-02-20 LAB — POCT INR: INR: 1.9 — AB (ref 0.9–1.1)

## 2020-02-23 ENCOUNTER — Non-Acute Institutional Stay (SKILLED_NURSING_FACILITY): Payer: Medicare Other | Admitting: Adult Health

## 2020-02-23 ENCOUNTER — Encounter: Payer: Self-pay | Admitting: Adult Health

## 2020-02-23 DIAGNOSIS — Z9359 Other cystostomy status: Secondary | ICD-10-CM | POA: Diagnosis not present

## 2020-02-23 DIAGNOSIS — F0150 Vascular dementia without behavioral disturbance: Secondary | ICD-10-CM | POA: Diagnosis not present

## 2020-02-23 DIAGNOSIS — E7849 Other hyperlipidemia: Secondary | ICD-10-CM

## 2020-02-23 DIAGNOSIS — I4811 Longstanding persistent atrial fibrillation: Secondary | ICD-10-CM | POA: Diagnosis not present

## 2020-02-23 DIAGNOSIS — I5032 Chronic diastolic (congestive) heart failure: Secondary | ICD-10-CM

## 2020-02-23 DIAGNOSIS — B351 Tinea unguium: Secondary | ICD-10-CM

## 2020-02-23 DIAGNOSIS — L089 Local infection of the skin and subcutaneous tissue, unspecified: Secondary | ICD-10-CM

## 2020-02-23 NOTE — Progress Notes (Signed)
Location:  Occupational psychologist of Service:  SNF (31) Provider:   Cindi Carbon, ANP [place] [phone]  Gayland Curry, DO  Patient Care Team: Gayland Curry, DO as PCP - General (Geriatric Medicine) [name], MD as PCP - Cardiology (Cardiology)  Extended Emergency Contact Information Primary Emergency Contact: Sacks,[name] Address: [address]          [place], [place] [address] [name] of [place] Phone: [phone] Mobile Phone: [phone] Relation: Spouse Secondary Emergency Contact: Matos,Maurie Address: [place] [address] [name] of [place] Phone: [phone] Mobile Phone: [phone] Relation: Son  Code Status:  DNR Goals of care: Advanced Directive information Advanced Directives 11/21/2019  Does Patient Have a Medical Advance Directive? Yes  Type of Paramedic of [place];Living will  Does patient want to make changes to medical advance directive? -  Copy of [place] in Chart? No - copy requested  Pre-existing out of facility DNR order (yellow form or pink MOST form) -     Chief Complaint  Patient presents with  . Medical Management of Chronic Issues    HPI:  Pt is a 86 y.o. male seen today for medical management of chronic diseases.    The nurse asked me to look at his s/p site as there is some mild erythema and tenderness but no fever or purulent drainage. The cath is draining well with no urinary symptoms.   Afib on chronic coumadin: no current s/e. Rate is controlled.  Lab Results  Component Value Date   INR 1.9 (A) 02/20/2020   INR 1.3 (A) 02/06/2020   INR 1.9 (A) 02/02/2020   PROTIME 23.1 (A) 02/20/2020   PROTIME 25.7 (A) 01/19/2020   PROTIME 18.7 (A) 12/29/2019   PROTIME 31.0 (A) 12/29/2019   His toenails are thick and getting long. He had an avulsion of the left 3rd toenail.   No issues  with weight gain, edema, sob, pnd, etc  Noted hx of vascular dementia, requiring skilled care. At times he makes inappropriate comments towards women but is generally polite and does not have any aggressive behaviors.   Past Medical History:  Diagnosis Date  . Adult failure to thrive    Per incoming records from [facility]  . Allergic rhinitis    Per incoming records from [facility]  . Atrial fibrillation ([place])   . BPH (benign prostatic hyperplasia)    Per incoming records from [facility]  . Cancer of kidney [facility])    Right, Per incoming records from [facility]  . Cataract    Per incoming records from [facility]  . Cerebrovascular accident, late effects    Per incoming records from [facility]  . Chronic kidney disease, stage III (moderate)    Per incoming records from [facility]  . Claudication [facility])    Per incoming records from [facility]  . Cognitive impairment    Mild, Per incoming records from [facility]  . Constipation    Per incoming records from [facility]  . Dementia ([place])   . Diarrhea    Better off Aricept, Per incoming records from [facility]  . Diastolic dysfunction    on 2D Echo 07/2009 and 2016, Per incoming records from [facility]  . Diverticulosis    Mild,  Per incoming records from [facility]  . Diverticulosis of colon (without mention of hemorrhage)   . ED (erectile dysfunction)    Per incoming records from [facility]  . History of Coumadin therapy    Per incoming records from [facility]  . History of echocardiogram 09/2014   Per incoming records from [facility]  . Hyperlipemia   . Hypertension   . Kidney mass    Per incoming records from [facility]  . Lower leg edema     Per incoming records from [facility]  . Melanoma ([place])   . Neuropathy    Per incoming records from [facility]  . OA (osteoarthritis)    Per incoming records from [facility]  . Peripheral neuropathy    Per incoming records from [facility]  . Personal history of colonic polyps 1999 & 2004   adenomatous polyps  . Pulmonary arterial hypertension ([place])    Per incoming records from [facility]  . Rosacea    Per incoming records from [facility]  . Thrombocytopenia ([place]) 2017   Per incoming records from [facility]  . UTI (urinary tract infection)    Sepsis, Per incoming records from [facility]  . Ventricular hypertrophy   . Walker as ambulation aid    Per incoming records from [employer]   Past Surgical History:  Procedure Laterality Date  . APPENDECTOMY     Per incoming records from [facility]  . CATARACT EXTRACTION     Per incoming records from [facility]  . IR CATHETER TUBE CHANGE  11/21/2019  . KNEE SURGERY     right  . PILONIDAL CYST DRAINAGE    . POLYPECTOMY     Per incoming records from [facility]  . schwanoma tumor lumbar spine    . SPINE SURGERY     tumor removed  . THORACIC 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, Per incoming records from [facility]  . TONSILLECTOMY AND ADENOIDECTOMY      Allergies  Allergen Reactions  . Penicillins Rash    Has patient had a PCN reaction causing immediate rash, facial/tongue/throat swelling, SOB or lightheadedness with hypotension: unknown Has patient had a PCN reaction causing severe rash involving mucus membranes or skin necrosis: unknown Has patient had a PCN reaction that required hospitalization : unknown Has patient had a PCN reaction occurring within the last 10 years: no If all of the  above answers are "NO", then may proceed with Cephalosporin use.     Outpatient Encounter Medications as of 02/23/2020  Medication Sig  . acetaminophen (TYLENOL) 500 MG tablet Take 500 mg by mouth 2 (two) times daily. Scheduled and every 4 hours as needed (ending 05/05/2019)Document area of discomfort and effectiveness of medication.  Marland Kitchen atorvastatin (LIPITOR) 20 MG tablet Take 20 mg by mouth every evening.   . cholecalciferol (VITAMIN D3) 25 MCG (1000 UT) tablet Take 2,000 Units by mouth daily.   . DULoxetine (CYMBALTA) 30 MG capsule Take 30 mg by mouth daily.  . fluorouracil (EFUDEX) 5 % cream Apply topically 2 (two) times daily. Apply to right cheek x 4 weeks  . methenamine (MANDELAMINE) 1 g tablet Take 1,000 mg by mouth 2 (two) times daily.  . mineral oil-hydrophilic petrolatum (AQUAPHOR) ointment Apply topically daily. Right cheek and scalp  . polyethylene glycol (MIRALAX / GLYCOLAX) 17 g packet Take  17 g by mouth daily.  Marland Kitchen triamcinolone lotion (KENALOG) 0.1 % Apply 1 application topically 2 (two) times daily as needed.   . warfarin (COUMADIN) 5 MG tablet Take 5 mg by mouth daily.    No facility-administered encounter medications on file as of 02/23/2020.    Review of Systems  Constitutional: Negative for activity change, appetite change, chills, diaphoresis, fatigue, fever and unexpected weight change.  Respiratory: Negative for cough, shortness of breath, wheezing and stridor.   Cardiovascular: Positive for leg swelling. Negative for chest pain and palpitations.  Gastrointestinal: Negative for abdominal distention, abdominal pain, constipation and diarrhea.  Genitourinary: Negative for difficulty urinating and dysuria.  Musculoskeletal: Positive for gait problem. Negative for arthralgias, back pain, joint swelling and myalgias.  Skin: Positive for rash.  Neurological: Negative for dizziness, seizures, syncope, facial asymmetry, speech difficulty, weakness and headaches.    Hematological: Negative for adenopathy. Does not bruise/bleed easily.  Psychiatric/Behavioral: Positive for confusion. Negative for agitation and behavioral problems.    Immunization History  Administered Date(s) Administered  . Influenza-Unspecified 04/17/2014, 05/01/2016, 04/15/2019  . Moderna SARS-COVID-2 Vaccination 07/12/2019, 08/09/2019  . Pneumococcal Conjugate-13 04/20/2014  . Pneumococcal Polysaccharide-23 07/08/2003, 03/31/2011  . Pneumococcal-Unspecified 01/26/2004  . Td 08/23/2007  . Tdap 01/07/2012  . Zoster 07/17/2005  . Zoster Recombinat (Shingrix) 10/05/2017, 12/09/2017   Pertinent  Health Maintenance Due  Topic Date Due  . INFLUENZA VACCINE  02/05/2020  . PNA vac Low Risk Adult  Completed   Fall Risk  10/30/2019 06/23/2019 05/07/2019 05/03/2019  Falls in the past year? - 1 - -  Number falls in past yr: - 1 - -  Injury with Fall? - 1 - -  Risk for fall due to : History of fall(s);Impaired balance/gait;Impaired mobility;Mental status change History of fall(s);Impaired balance/gait;Impaired mobility History of fall(s);Impaired balance/gait;Impaired mobility;Mental status change;Medication side effect History of fall(s);Impaired balance/gait;Impaired mobility;Medication side effect;Mental status change  Follow up Falls evaluation completed;Education provided;Falls prevention discussed Falls evaluation completed Falls evaluation completed;Education provided;Falls prevention discussed Falls evaluation completed;Education provided;Falls prevention discussed  Comment - - [facility] all [facility] at [place]:    Vitals:   02/23/20 1028  Weight: 258 lb (117 kg)   Body mass index is 29.06 kg/m. Physical Exam Vitals and nursing note reviewed.  Constitutional:      General: He is not in acute distress.    Appearance: He is not diaphoretic.  HENT:     Head: Normocephalic and atraumatic.     Mouth/Throat:     Mouth: Mucous  membranes are moist.     Pharynx: Oropharynx is clear.  Neck:     Thyroid: No thyromegaly.     Vascular: No JVD.     Trachea: No tracheal deviation.  Cardiovascular:     Rate and Rhythm: Normal rate. Rhythm irregular.     Heart sounds: No murmur heard.   Pulmonary:     Effort: Pulmonary effort is normal. No respiratory distress.     Breath sounds: Normal breath sounds. No wheezing.  Abdominal:     General: Bowel sounds are normal. There is no distension.     Palpations: Abdomen is soft.     Tenderness: There is no abdominal tenderness.  Musculoskeletal:     Right lower leg: Edema (+1) present.     Left lower leg: Edema (+1) present.       Feet:  Feet:     Right foot:     Skin integrity: Skin integrity normal.  Toenail Condition: Right toenails are abnormally thick. Fungal disease present.    Left foot:     Skin integrity: Skin integrity normal.     Toenail Condition: Left toenails are abnormally thick. Fungal disease present. Lymphadenopathy:     Cervical: No cervical adenopathy.  Skin:    General: Skin is warm and dry.     Comments: Erythema to surrounding s/p site with mild tenderness. No drainage noted.   Neurological:     General: No focal deficit present.     Mental Status: He is alert. Mental status is at baseline.     Comments: Oriented x 2. MAE  Psychiatric:        Mood and Affect: Mood normal.     Labs reviewed: Recent Labs    05/01/19 1037 05/19/19 0000 06/02/19 0132 07/28/19 0000 10/06/19 1010 11/14/19 0000 11/17/19 0000 11/17/19 0700 11/21/19 0000 11/21/19 0600 12/08/19 0000  NA 140   < > 138   < > 137   < > 143  --  144  144  --  141  141  K 4.4   < > 3.8   < > 4.0   < > 3.9  --  4.1  4.1  --  4.8  4.8  CL 103   < > 104   < > 103   < > 104  --  107  107  --  105  105  CO2 25   < > 23   < > 25   < > 26*  --  25*  25*  --  20  20  GLUCOSE 178*  --  125*  --  104*  --   --   --   --   --   --   BUN 27*   < > 20   < > 19   < > 39*  --   24*  24*  --  14  14  CREATININE 1.44*   < > 1.32*   < > 1.01   < > 1.0  --  0.8  0.8  --  0.9  0.9  CALCIUM 10.3  --  10.1   < > 9.6   < >  --  10.7  --  10.5 10.6  10.6   < > = values in this interval not displayed.   Recent Labs    03/22/19 0808 05/01/19 1037 05/19/19 0000  AST 17 26 19   ALT 15 21 19   ALKPHOS 73 65 77  BILITOT 1.2 1.5*  --   PROT 6.4* 6.8  --   ALBUMIN 4.1 4.0  --    Recent Labs    05/19/19 0000 06/02/19 0132 06/02/19 0132 07/28/19 0000 07/28/19 0000 10/06/19 1010 11/14/19 0000 11/17/19 0700 11/21/19 1340  WBC   < > 4.1   < > 2.7   < > 2.4* 9.4 3.1 2.7*  NEUTROABS  --  3.0  --  2  --  1.4* 8  --   --   HGB   < > 15.0   < > 13.9   < > 13.7 13.2* 13.5 13.6  HCT   < > 46.2   < > 42   < > 43.9 39* 41 43.6  MCV  --  93.9  --   --   --  94.0  --   --  93.8  PLT   < > 78*   < > 85*   < > 109* 133*  138* 154   < > = values in this interval not displayed.   Lab Results  Component Value Date   TSH 0.84 05/19/2019   Lab Results  Component Value Date   HGBA1C 6 08/22/2019   Lab Results  Component Value Date   CHOL 123 05/19/2019   HDL 30 (A) 05/19/2019   LDLCALC 62 05/19/2019   TRIG 154 05/19/2019    Significant Diagnostic Results in last 30 days:  No results found.  Assessment/Plan  1. Skin infection Bactroban bid x 1 week to cath site  2. Suprapubic catheter ([place]) Functioning well. Continue maintenance care by [facility] staff. Recommend dating stat locks and foley bags.   3. Longstanding persistent atrial fibrillation (HCC) Rate is controlled. Continues on coumadin with no s/e for CVA risk reduction. INR due next week   4. Chronic diastolic heart failure (HCC) Compensated, not currently on diuretics.   5. Vascular dementia without behavioral disturbance (HCC) Moderate Continue supportive care in the skilled care unit.   6. Toenail fungus Needs referral to podiatry Recommend toe sleeve for left 3rd toe    Family/ staff  Communication: resident and his nurse  Labs/tests ordered:  INR 1 week

## 2020-02-24 DIAGNOSIS — Z9189 Other specified personal risk factors, not elsewhere classified: Secondary | ICD-10-CM | POA: Diagnosis not present

## 2020-02-24 DIAGNOSIS — Z20828 Contact with and (suspected) exposure to other viral communicable diseases: Secondary | ICD-10-CM | POA: Diagnosis not present

## 2020-02-27 DIAGNOSIS — Z7901 Long term (current) use of anticoagulants: Secondary | ICD-10-CM | POA: Diagnosis not present

## 2020-02-27 LAB — POCT INR: INR: 1.5 — AB (ref <=1.1)

## 2020-03-02 ENCOUNTER — Encounter: Payer: Self-pay | Admitting: Internal Medicine

## 2020-03-02 ENCOUNTER — Telehealth: Payer: Self-pay | Admitting: Internal Medicine

## 2020-03-02 DIAGNOSIS — N390 Urinary tract infection, site not specified: Secondary | ICD-10-CM | POA: Diagnosis not present

## 2020-03-02 DIAGNOSIS — D649 Anemia, unspecified: Secondary | ICD-10-CM | POA: Diagnosis not present

## 2020-03-02 DIAGNOSIS — Z20828 Contact with and (suspected) exposure to other viral communicable diseases: Secondary | ICD-10-CM | POA: Diagnosis not present

## 2020-03-02 DIAGNOSIS — I5032 Chronic diastolic (congestive) heart failure: Secondary | ICD-10-CM | POA: Diagnosis not present

## 2020-03-02 DIAGNOSIS — R6889 Other general symptoms and signs: Secondary | ICD-10-CM | POA: Diagnosis not present

## 2020-03-02 DIAGNOSIS — R0989 Other specified symptoms and signs involving the circulatory and respiratory systems: Secondary | ICD-10-CM | POA: Diagnosis not present

## 2020-03-02 DIAGNOSIS — Z9189 Other specified personal risk factors, not elsewhere classified: Secondary | ICD-10-CM | POA: Diagnosis not present

## 2020-03-02 LAB — BASIC METABOLIC PANEL
BUN: 18 (ref 4–21)
CO2: 23 — AB (ref 13–22)
Chloride: 105 (ref 99–108)
Creatinine: 0.8 (ref 0.6–1.3)
Glucose: 100
Potassium: 3.9 (ref 3.4–5.3)
Sodium: 139 (ref 137–147)

## 2020-03-02 LAB — CBC: RBC: 5.05 (ref 3.87–5.11)

## 2020-03-02 LAB — COMPREHENSIVE METABOLIC PANEL: Calcium: 10 (ref 8.7–10.7)

## 2020-03-02 LAB — CBC AND DIFFERENTIAL
HCT: 45 (ref 41–53)
Hemoglobin: 15 (ref 13.5–17.5)
Platelets: 93 — AB (ref 150–399)
WBC: 5

## 2020-03-02 NOTE — Telephone Encounter (Signed)
Nurse manager sent me tiger text this afternoon with concerns about [name] not acting right.  He is having full body jerky movements.  BP was high at 190/90, HR 80, POX dropped to 86RA and took 4L to get it to 92.  Edema to BL LE.  Fingertips were cool, but forearms hot.  Felt warm overall but temp normal/afebrile.  Alert and oriented to his baseline.  Groaning in room when I called her back.  She also noted his urine from his suprapubic being particularly dark even last night/tea-colored, but he has 300cc in the bag and there are no reported problems with the suprapubic working properly at this point.  He's been having difficulty with the ostomy itself for the suprapubic and there were concerns for infection possibly--lots of drainage and he's supposed to be seeing urology soon about it.  He also reports feeling achy and his nailbeds are blue to white per nurse.    Recommended isolation and covid swab, but facility on routine swabbing due to prior employee positive within two weeks so recently swabbed already.  Stat cbc, bmp, bnp, UA c+s and 2 view CXR.  Push fluids just for tonight b/c already has some peripheral edema.    Await results.   Pt has most form for DNR, limited additional interventions, fluids for defined period, feeding tube defined period.  This had been done with Dr. Virgina Jock.  Clem, his wife, has previously been hesitant to send him out to the hospital under normal circumstances.  She was present and Junie Panning was talking with her.

## 2020-03-05 ENCOUNTER — Encounter: Payer: Self-pay | Admitting: Adult Health

## 2020-03-05 ENCOUNTER — Non-Acute Institutional Stay (SKILLED_NURSING_FACILITY): Payer: Medicare Other | Admitting: Adult Health

## 2020-03-05 DIAGNOSIS — D649 Anemia, unspecified: Secondary | ICD-10-CM | POA: Diagnosis not present

## 2020-03-05 DIAGNOSIS — Z7901 Long term (current) use of anticoagulants: Secondary | ICD-10-CM | POA: Diagnosis not present

## 2020-03-05 DIAGNOSIS — I4811 Longstanding persistent atrial fibrillation: Secondary | ICD-10-CM | POA: Diagnosis not present

## 2020-03-05 DIAGNOSIS — T83010A Breakdown (mechanical) of cystostomy catheter, initial encounter: Secondary | ICD-10-CM

## 2020-03-05 DIAGNOSIS — D696 Thrombocytopenia, unspecified: Secondary | ICD-10-CM

## 2020-03-05 DIAGNOSIS — I10 Essential (primary) hypertension: Secondary | ICD-10-CM | POA: Diagnosis not present

## 2020-03-05 LAB — CBC
RBC: 4.75 (ref 3.87–5.11)
RBC: 4.75 (ref 3.87–5.11)

## 2020-03-05 LAB — PROTIME-INR: Protime: 25.5 — AB (ref 10.0–13.8)

## 2020-03-05 LAB — CBC AND DIFFERENTIAL
HCT: 43 (ref 41–53)
Hemoglobin: 14.4 (ref 13.5–17.5)
Hemoglobin: 14.4 (ref 13.5–17.5)
Platelets: 113 — AB (ref 150–399)
Platelets: 113 — AB (ref 150–399)
WBC: 3.2
WBC: 3.2

## 2020-03-05 LAB — POCT INR
INR: 2.2 — AB (ref 0.9–1.1)
INR: 2.2 — AB (ref <=1.1)

## 2020-03-05 NOTE — Progress Notes (Signed)
Location:  Occupational psychologist of Service:  SNF (31) Provider:   Cindi Carbon, ANP [place] [phone]   Gayland Curry, DO  Patient Care Team: Gayland Curry, DO as PCP - General (Geriatric Medicine) [name], MD as PCP - Cardiology (Cardiology)  Extended Emergency Contact Information Primary Emergency Contact: Salyers,[name] Address: [address]          [place], [place] [address] [name] of [place] Phone: [phone] Mobile Phone: [phone] Relation: Spouse Secondary Emergency Contact: Mattia,Anjelo Address: [place] [address] [name] of [place] Phone: [phone] Mobile Phone: [phone] Relation: Son  Code Status:  DNR Goals of care: Advanced Directive information Advanced Directives 11/21/2019  Does Patient Have a Medical Advance Directive? Yes  Type of Paramedic of [place];Living will  Does patient want to make changes to medical advance directive? -  Copy of [place] in Chart? No - copy requested  Pre-existing out of facility DNR order (yellow form or pink MOST form) -     Chief Complaint  Patient presents with  . Acute Visit    f/u decreased sats and fever     HPI:  Pt is a 86 y.o. male seen today for an acute visit for f/u regarding decreased 02 sats and fever.  The oncall [facility] provider was notified due to change of condition for Mr. Troy with body jerking movements, high bp 190/90, temp 99.3, and low pulse of 86%. His finger tips were blue and pale. A covid swab was ordered which was negative. CXR showed no pna with mild pulmonary congestion, UA C and S showed 3 specimens suggesting likely contamination. CBC and BMP were unremarkable from baseline.  He has a suprapubic catheter due to urinary retention and this was flushed and a large mucus plug was noted. He had 300 cc of clear urine in the bag at the  time. We do not think the low 02 sats were accurate due to his pale fingers and lack of correlation between the HR count manually vs the pulse oximetry. He was placed on oxygen during that time as well and placed on isolation. He has had these symptoms before which were related to obstruction of his catheter. For my visit today he is feeling well, no bladder pain, no fever, vitals are stable, and no additional jerking movements.  He has had some issues with a small bleb that is tender near his s/p stoma, as well as mild erythema/tenerness to the site. The area was treated with bactroban on 8/19 with no change in the erythema or tenderness.   His INR today is 2.2. He takes coumadin for CVA risk reduction.   Past Medical History:  Diagnosis Date  . Adult failure to thrive    Per incoming records from [facility]  . Allergic rhinitis    Per incoming records from [facility]  . Atrial fibrillation ([place])   . BPH (benign prostatic hyperplasia)    Per incoming records from [facility]  . Cancer of kidney [facility])    Right, Per incoming records from [facility]  . Cataract    Per incoming records from [facility]  . Cerebrovascular accident, late effects    Per incoming records from [facility]  . Chronic kidney disease, stage III (moderate)    Per incoming records  from [facility]  . Claudication [facility])    Per incoming records from [facility]  . Cognitive impairment    Mild, Per incoming records from [facility]  . Constipation    Per incoming records from [facility]  . Dementia ([place])   . Diarrhea    Better off Aricept, Per incoming records from [facility]  . Diastolic dysfunction    on 2D Echo 07/2009 and 2016, Per incoming records from [facility]  . Diverticulosis    Mild, Per incoming records from  [facility]  . Diverticulosis of colon (without mention of hemorrhage)   . ED (erectile dysfunction)    Per [facility] records [facility]  . History of Coumadin therapy    Per incoming records from [facility]  . History of echocardiogram 09/2014   Per incoming records from [facility]  . Hyperlipemia   . Hypertension   . Kidney mass    Per incoming records from [facility]  . Lower leg edema    Per incoming records from [facility]  . Melanoma ([place])   . Neuropathy    Per incoming records from [facility]  . OA (osteoarthritis)    Per incoming records from [facility]  . Peripheral neuropathy    Per incoming records from [facility]  . Personal history of colonic polyps 1999 & 2004   adenomatous polyps  . Pulmonary arterial hypertension ([place])    Per incoming records from [facility]  . Rosacea    Per incoming records from [facility]  . Thrombocytopenia ([place]) 2017   Per incoming records from [facility]  . UTI (urinary tract infection)    Sepsis, Per incoming records from [facility]  . Ventricular hypertrophy   . Walker as ambulation aid    Per incoming records from [facility]   Past Surgical History:  Procedure Laterality Date  . APPENDECTOMY     Per incoming records from [facility]  . CATARACT EXTRACTION     Per incoming records from [facility]  . IR CATHETER TUBE CHANGE  11/21/2019  . KNEE SURGERY     right  . PILONIDAL CYST DRAINAGE    . POLYPECTOMY     Per incoming records from [facility]  . schwanoma tumor lumbar spine    . SPINE SURGERY     tumor removed  . THORACIC 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, Per incoming records from [facility]  .  TONSILLECTOMY AND ADENOIDECTOMY      Allergies  Allergen Reactions  . Penicillins Rash    Has patient had a PCN reaction causing immediate rash, facial/tongue/throat swelling, SOB or lightheadedness with hypotension: unknown Has patient had a PCN reaction causing severe rash involving mucus membranes or skin necrosis: unknown Has patient had a PCN reaction that required hospitalization : unknown Has patient had a PCN reaction occurring within the last 10 years: no If all of the above answers are "NO", then may proceed with Cephalosporin use.     Outpatient Encounter Medications as of 03/05/2020  Medication Sig  . warfarin (COUMADIN) 6 MG tablet Take 6 mg by mouth daily.  Marland Kitchen acetaminophen (TYLENOL) 500 MG tablet Take 500 mg by mouth 2 (two) times daily. Scheduled and every 4 hours as needed (ending 05/05/2019)Document area of discomfort and effectiveness of  medication.  Marland Kitchen atorvastatin (LIPITOR) 20 MG tablet Take 20 mg by mouth every evening.   . cholecalciferol (VITAMIN D3) 25 MCG (1000 UT) tablet Take 2,000 Units by mouth daily.   . DULoxetine (CYMBALTA) 30 MG capsule Take 30 mg by mouth daily.  . fluorouracil (EFUDEX) 5 % cream Apply topically 2 (two) times daily. Apply to right cheek x 4 weeks  . methenamine (MANDELAMINE) 1 g tablet Take 1,000 mg by mouth 2 (two) times daily.  . mineral oil-hydrophilic petrolatum (AQUAPHOR) ointment Apply topically daily. Right cheek and scalp  . polyethylene glycol (MIRALAX / GLYCOLAX) 17 g packet Take 17 g by mouth daily.  Marland Kitchen triamcinolone lotion (KENALOG) 0.1 % Apply 1 application topically 2 (two) times daily as needed.   . [DISCONTINUED] warfarin (COUMADIN) 5 MG tablet Take 5 mg by mouth daily.    No facility-administered encounter medications on file as of 03/05/2020.    Review of Systems  Constitutional: Negative for activity change, appetite change, chills, diaphoresis, fatigue, fever and unexpected weight change.  Respiratory: Negative for  cough, shortness of breath, wheezing and stridor.   Cardiovascular: Positive for leg swelling. Negative for chest pain and palpitations.  Gastrointestinal: Negative for abdominal distention, abdominal pain, constipation and diarrhea.  Genitourinary: Negative for decreased urine volume, difficulty urinating, discharge, dysuria, flank pain, penile pain, penile swelling and testicular pain.  Musculoskeletal: Positive for gait problem. Negative for arthralgias, back pain, joint swelling and myalgias.  Skin: Positive for color change (mild erythema around s/p site).  Neurological: Negative for dizziness, seizures, syncope, facial asymmetry, speech difficulty, weakness and headaches.  Hematological: Negative for adenopathy. Does not bruise/bleed easily.  Psychiatric/Behavioral: Positive for confusion. Negative for agitation and behavioral problems.    Immunization History  Administered Date(s) Administered  . Influenza-Unspecified 04/17/2014, 05/01/2016, 04/15/2019  . Moderna SARS-COVID-2 Vaccination 07/12/2019, 08/09/2019  . Pneumococcal Conjugate-13 04/20/2014  . Pneumococcal Polysaccharide-23 07/08/2003, 03/31/2011  . Pneumococcal-Unspecified 01/26/2004  . Td 08/23/2007  . Tdap 01/07/2012  . Zoster 07/17/2005  . Zoster Recombinat (Shingrix) 10/05/2017, 12/09/2017   Pertinent  Health Maintenance Due  Topic Date Due  . INFLUENZA VACCINE  02/05/2020  . PNA vac Low Risk Adult  Completed   Fall Risk  10/30/2019 06/23/2019 05/07/2019 05/03/2019  Falls in the past year? - 1 - -  Number falls in past yr: - 1 - -  Injury with Fall? - 1 - -  Risk for fall due to : History of fall(s);Impaired balance/gait;Impaired mobility;Mental status change History of fall(s);Impaired balance/gait;Impaired mobility History of fall(s);Impaired balance/gait;Impaired mobility;Mental status change;Medication side effect History of fall(s);Impaired balance/gait;Impaired mobility;Medication side effect;Mental status  change  Follow up Falls evaluation completed;Education provided;Falls prevention discussed Falls evaluation completed Falls evaluation completed;Education provided;Falls prevention discussed Falls evaluation completed;Education provided;Falls prevention discussed  Comment - - [facility] all addressed at [place]:    There were no vitals filed for this visit. There is no height or weight on file to calculate BMI. Physical Exam Vitals and nursing note reviewed.  Constitutional:      General: He is not in acute distress.    Appearance: He is not diaphoretic.  HENT:     Head: Normocephalic and atraumatic.     Nose: Nose normal. No congestion.  Eyes:     Conjunctiva/sclera: Conjunctivae normal.     Pupils: Pupils are equal, round, and reactive to light.  Neck:     Thyroid: No thyromegaly.     Vascular: No JVD.  Trachea: No tracheal deviation.  Cardiovascular:     Rate and Rhythm: Normal rate. Rhythm irregular.     Heart sounds: No murmur heard.   Pulmonary:     Effort: Pulmonary effort is normal. No respiratory distress.     Breath sounds: Normal breath sounds. No wheezing.     Comments: No cyanosis Abdominal:     General: Bowel sounds are normal. There is no distension.     Palpations: Abdomen is soft.     Tenderness: There is no abdominal tenderness. There is no right CVA tenderness or guarding.  Musculoskeletal:     Cervical back: Normal range of motion and neck supple.     Comments: BLE edema +1  Lymphadenopathy:     Cervical: No cervical adenopathy.  Skin:    General: Skin is warm and dry.     Comments: Right cheek with erythema to skin lesion being treated by derm  S/p site with mild erythema, no drainage. Small bleb at the stoma site 100% red.   Neurological:     Mental Status: He is alert and oriented to person, place, and time.     Cranial Nerves: No cranial nerve deficit.     Labs reviewed: Recent Labs     05/01/19 1037 05/19/19 0000 06/02/19 0132 07/28/19 0000 10/06/19 1010 11/14/19 0000 11/17/19 0000 11/17/19 0700 11/21/19 0000 11/21/19 0600 12/08/19 0000  NA 140   < > 138   < > 137   < > 143  --  144  144  --  141  141  K 4.4   < > 3.8   < > 4.0   < > 3.9  --  4.1  4.1  --  4.8  4.8  CL 103   < > 104   < > 103   < > 104  --  107  107  --  105  105  CO2 25   < > 23   < > 25   < > 26*  --  25*  25*  --  20  20  GLUCOSE 178*  --  125*  --  104*  --   --   --   --   --   --   BUN 27*   < > 20   < > 19   < > 39*  --  24*  24*  --  14  14  CREATININE 1.44*   < > 1.32*   < > 1.01   < > 1.0  --  0.8  0.8  --  0.9  0.9  CALCIUM 10.3  --  10.1   < > 9.6   < >  --  10.7  --  10.5 10.6  10.6   < > = values in this interval not displayed.   Recent Labs    03/22/19 0808 05/01/19 1037 05/19/19 0000  AST 17 26 19   ALT 15 21 19   ALKPHOS 73 65 77  BILITOT 1.2 1.5*  --   PROT 6.4* 6.8  --   ALBUMIN 4.1 4.0  --    Recent Labs    05/19/19 0000 06/02/19 0132 06/02/19 0132 07/28/19 0000 07/28/19 0000 10/06/19 1010 11/14/19 0000 11/17/19 0700 11/21/19 1340  WBC   < > 4.1   < > 2.7   < > 2.4* 9.4 3.1 2.7*  NEUTROABS  --  3.0  --  2  --  1.4* 8  --   --  HGB   < > 15.0   < > 13.9   < > 13.7 13.2* 13.5 13.6  HCT   < > 46.2   < > 42   < > 43.9 39* 41 43.6  MCV  --  93.9  --   --   --  94.0  --   --  93.8  PLT   < > 78*   < > 85*   < > 109* 133* 138* 154   < > = values in this interval not displayed.   Lab Results  Component Value Date   TSH 0.84 05/19/2019   Lab Results  Component Value Date   HGBA1C 6 08/22/2019   Lab Results  Component Value Date   CHOL 123 05/19/2019   HDL 30 (A) 05/19/2019   LDLCALC 62 05/19/2019   TRIG 154 05/19/2019    Significant Diagnostic Results in last 30 days:  No results found.  Assessment/Plan 1. Suprapubic catheter dysfunction, initial encounter [facility]) His symptoms were likely due to a clogged catheter as this has happened at  least twice before with a similar presentation. Once his catheter was flushed his symptoms improved. No other acute findings have been noted and his bladder is scanned at 0 cc.  Will add order for prn cathter flush with 30-60 cc NS. He will need to f/u with urology regarding questions about the stoma of his s/p cath. It does not appear to be infected at this time. May discontinue isolation.    2. Thrombocytopenia ([place]) Noted on CBC and unchanged, followed by Dr Alen Blew.   3. Longstanding persistent atrial fibrillation (HCC) Rate controlled without medication  4. Chronic anticoagulation Continue coumadin 6 mg qd and repeat INR in 1 week     Family/ staff Communication: nurse  Labs/tests ordered:  INR in 1 week

## 2020-03-07 ENCOUNTER — Encounter: Payer: Self-pay | Admitting: Internal Medicine

## 2020-03-12 DIAGNOSIS — Z7901 Long term (current) use of anticoagulants: Secondary | ICD-10-CM | POA: Diagnosis not present

## 2020-03-12 LAB — POCT INR: INR: 2.6 — AB (ref 0.9–1.1)

## 2020-03-16 ENCOUNTER — Encounter: Payer: Self-pay | Admitting: Internal Medicine

## 2020-03-26 ENCOUNTER — Non-Acute Institutional Stay (SKILLED_NURSING_FACILITY): Payer: Medicare Other | Admitting: Adult Health

## 2020-03-26 ENCOUNTER — Encounter: Payer: Self-pay | Admitting: Adult Health

## 2020-03-26 DIAGNOSIS — I4811 Longstanding persistent atrial fibrillation: Secondary | ICD-10-CM

## 2020-03-26 DIAGNOSIS — N401 Enlarged prostate with lower urinary tract symptoms: Secondary | ICD-10-CM

## 2020-03-26 DIAGNOSIS — F0150 Vascular dementia without behavioral disturbance: Secondary | ICD-10-CM | POA: Diagnosis not present

## 2020-03-26 DIAGNOSIS — N2889 Other specified disorders of kidney and ureter: Secondary | ICD-10-CM

## 2020-03-26 DIAGNOSIS — E7849 Other hyperlipidemia: Secondary | ICD-10-CM | POA: Diagnosis not present

## 2020-03-26 DIAGNOSIS — R338 Other retention of urine: Secondary | ICD-10-CM

## 2020-03-26 DIAGNOSIS — Z7901 Long term (current) use of anticoagulants: Secondary | ICD-10-CM | POA: Diagnosis not present

## 2020-03-26 LAB — POCT INR: INR: 3 — AB (ref <=1.1)

## 2020-03-26 NOTE — Progress Notes (Signed)
Location:  Occupational psychologist of Service:  SNF (31) Provider:   Cindi Carbon, ANP [place] [phone]   Gayland Curry, DO  Patient Care Team: Gayland Curry, DO as PCP - General (Geriatric Medicine) [name], MD as PCP - Cardiology (Cardiology)  Extended Emergency Contact Information Primary Emergency Contact: Gellner,[name] Address: [address]          [place], [place] [address] [name] of [place] Phone: [phone] Mobile Phone: [phone] Relation: Spouse Secondary Emergency Contact: Tienda,Othal Address: [place] [address] [name] of [place] Phone: [phone] Mobile Phone: [phone] Relation: Son  Code Status:  DNR Goals of care: Advanced Directive information Advanced Directives 11/21/2019  Does Patient Have a Medical Advance Directive? Yes  Type of Paramedic of [place];Living will  Does patient want to make changes to medical advance directive? -  Copy of [place] in Chart? No - copy requested  Pre-existing out of facility DNR order (yellow form or pink MOST form) -     Chief Complaint  Patient presents with   Medical Management of Chronic Issues    HPI:  Pt is a 86 y.o. male seen today for medical management of chronic diseases.    Currently on coumadin for CVA risk reduction due to afib. No reports of bleeding or bruising.  Lab Results  Component Value Date   INR 3.0 (A) 03/26/2020   INR 2.2 (A) 03/05/2020   INR 2.2 (A) 03/05/2020   PROTIME 25.5 (A) 03/05/2020   PROTIME 23.1 (A) 02/20/2020   PROTIME 25.7 (A) 01/19/2020   Vascular dementia: remains pleasant and ambulatory, makes inappropriate comments at times.  MMSE 18/30 on 07/22/19  He has an s/p cath due to urinary retention associated with BPH. He has a hx of blockage to the cath but at this time it is draining well and there are no  symptoms of bladder pain or discomfort  Noted hx of right renal mass followed by Urology  Functional status: ambulatory with a walker, has s/p cath Past Medical History:  Diagnosis Date   Adult failure to thrive    Per incoming records from [facility]   Allergic rhinitis    Per incoming records from [facility]   Atrial fibrillation [facility])    BPH (benign prostatic hyperplasia)    Per incoming records from [place] of kidney [facility])    Right, Per incoming records from [place]    Per [facility]   Cerebrovascular accident, late effects    Per incoming records from [place]   Chronic kidney disease, stage III (moderate)    Per incoming records from [facility])    Per [facility] records from [facility]   Cognitive impairment    Mild, Per incoming records from [facility]   Constipation    Per incoming records from [facility]   Dementia [facility])    Diarrhea    Better off Aricept, Per incoming records from [facility]   Diastolic dysfunction    on 2D Echo 07/2009 and 2016, Per incoming records from [facility]   Diverticulosis    Mild, Per incoming records from [facility]   Diverticulosis of colon (without mention of  hemorrhage)    ED (erectile dysfunction)    Per incoming records from [facility]   History of Coumadin therapy    Per incoming records from [facility]   History of echocardiogram 09/2014   Per incoming records from [facility]   Hyperlipemia    Hypertension    Kidney mass    Per incoming records from [place] leg edema    Per incoming records from [place] [facility])     Neuropathy    Per incoming records from [facility]   OA (osteoarthritis)    Per incoming records from [facility]   Peripheral neuropathy    Per incoming records from [place] history of colonic [place] & 2004   adenomatous polyps   Pulmonary arterial hypertension ([place])    Per incoming records from [place]    Per incoming records from [facility]   Thrombocytopenia [facility]) 2017   Per incoming records from [facility]   UTI (urinary tract infection)    Sepsis, Per incoming records from [place] as ambulation aid    Per incoming records from [facility]   Past Surgical History:  Procedure Laterality Date   APPENDECTOMY     Per incoming records from [place]     Per incoming records from [facility]   IR [place]  11/21/2019   KNEE SURGERY     right   PILONIDAL CYST DRAINAGE     POLYPECTOMY     Per incoming records from [place] tumor lumbar spine     SPINE SURGERY     tumor removed   THORACIC 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, Per incoming records from [place]      Allergies  Allergen Reactions   Penicillins Rash    Has patient had a PCN reaction causing immediate rash, facial/tongue/throat swelling, SOB or lightheadedness with hypotension: unknown Has patient had a PCN reaction causing severe rash involving mucus membranes or skin necrosis: unknown Has patient had a PCN reaction that required hospitalization : unknown Has patient had a PCN reaction occurring within the last 10 years: no If all of the above answers are "NO", then may proceed with Cephalosporin use.      Outpatient Encounter Medications as of 03/26/2020  Medication Sig   acetaminophen (TYLENOL) 500 MG tablet Take 500 mg by mouth 2 (two) times daily. Scheduled and every 4 hours as needed (ending 05/05/2019)Document area of discomfort and effectiveness of medication.   atorvastatin (LIPITOR) 20 MG tablet Take 20 mg by mouth every evening.    cholecalciferol (VITAMIN D3) 25 MCG (1000 UT) tablet Take 2,000 Units by mouth daily.    DULoxetine (CYMBALTA) 30 MG capsule Take 30 mg by mouth daily.   methenamine (MANDELAMINE) 1 g tablet Take 1,000 mg by mouth 2 (two) times daily.   mineral oil-hydrophilic petrolatum (AQUAPHOR) ointment Apply topically daily. Right cheek and scalp   polyethylene glycol (MIRALAX / GLYCOLAX) 17 g packet Take 17 g by mouth daily.   triamcinolone lotion (KENALOG) 0.1 % Apply 1 application topically 2 (two) times daily as needed.    warfarin (COUMADIN) 6 MG tablet Take 6 mg by mouth  daily.   No facility-administered encounter medications on file as of 03/26/2020.    Review of Systems  Constitutional: Negative for activity change, appetite change, chills, diaphoresis, fatigue, fever and unexpected weight change.  Respiratory: Negative for cough, shortness of breath, wheezing and stridor.   Cardiovascular: Positive for leg swelling. Negative for chest pain and palpitations.  Gastrointestinal: Negative for abdominal distention, abdominal pain, constipation and diarrhea.  Genitourinary: Negative for difficulty urinating, dysuria, flank pain and frequency.  Musculoskeletal: Positive for gait problem. Negative for arthralgias, back pain, joint swelling and myalgias.  Neurological: Negative for dizziness, seizures, syncope, facial asymmetry, speech difficulty, weakness and headaches.  Hematological: Negative for adenopathy. Does not bruise/bleed easily.  Psychiatric/Behavioral: Positive for confusion. Negative for agitation and behavioral problems.    Immunization  History  Administered Date(s) Administered   Influenza-Unspecified 04/17/2014, 05/01/2016, 04/15/2019   Moderna SARS-COVID-2 Vaccination 07/12/2019, 08/09/2019   Pneumococcal Conjugate-13 04/20/2014   Pneumococcal Polysaccharide-23 07/08/2003, 03/31/2011   Pneumococcal-Unspecified 01/26/2004   Td 08/23/2007   Tdap 01/07/2012   Zoster 07/17/2005   Zoster Recombinat (Shingrix) 10/05/2017, 12/09/2017   Pertinent  Health Maintenance Due  Topic Date Due   INFLUENZA VACCINE  02/05/2020   PNA vac Low Risk Adult  Completed   Fall Risk  10/30/2019 06/23/2019 05/07/2019 05/03/2019  Falls in the past year? - 1 - -  Number falls in past yr: - 1 - -  Injury with Fall? - 1 - -  Risk for fall due to : History of fall(s);Impaired balance/gait;Impaired mobility;Mental status change History of fall(s);Impaired balance/gait;Impaired mobility History of fall(s);Impaired balance/gait;Impaired mobility;Mental status change;Medication side effect History of fall(s);Impaired balance/gait;Impaired mobility;Medication side effect;Mental status change  Follow up Falls evaluation completed;Education provided;Falls prevention discussed Falls evaluation completed Falls evaluation completed;Education provided;Falls prevention discussed Falls evaluation completed;Education provided;Falls prevention discussed  Comment - - [facility] at [place]:    Vitals:   03/26/20 1808  Weight: 260 lb 3.2 oz (118 kg)   Body mass index is 29.31 kg/m.  Wt Readings from Last 3 Encounters:  03/26/20 260 lb 3.2 oz (118 kg)  02/23/20 258 lb (117 kg)  12/23/19 259 lb 12.8 oz (117.8 kg)    Physical Exam Vitals and nursing note reviewed.  Constitutional:      General: He is not in acute distress.    Appearance: He is not diaphoretic.  HENT:     Head: Normocephalic and atraumatic.     Mouth/Throat:     Mouth: Mucous membranes are moist.     Pharynx: Oropharynx  is clear. No oropharyngeal exudate.  Neck:     Thyroid: No thyromegaly.     Vascular: No JVD.     Trachea: No tracheal deviation.  Cardiovascular:     Rate and Rhythm: Normal rate. Rhythm irregular.     Heart sounds: No murmur heard.   Pulmonary:     Effort: Pulmonary effort is normal. No respiratory distress.     Breath sounds: Normal breath sounds. No wheezing.     Comments: No cyanosis.  Abdominal:     General: Bowel sounds are normal. There is no distension.     Palpations: Abdomen is soft.     Tenderness: There is no abdominal tenderness.  Musculoskeletal:     Right lower leg: Edema (+1) present.     Left lower leg: Edema (+1) present.  Lymphadenopathy:     Cervical: No cervical adenopathy.  Skin:    General: Skin is warm  and dry.  Neurological:     General: No focal deficit present.     Mental Status: He is alert. Mental status is at baseline.     Cranial Nerves: No cranial nerve deficit.  Psychiatric:        Mood and Affect: Mood normal.     Labs reviewed: Recent Labs    05/01/19 1037 05/19/19 0000 06/02/19 0132 07/28/19 0000 10/06/19 1010 11/14/19 0000 11/17/19 0700 11/21/19 0000 11/21/19 0600 12/08/19 0000 03/02/20 0000  NA 140   < > 138   < > 137   < >  --  144   144  --  141   141 139  K 4.4   < > 3.8   < > 4.0   < >  --  4.1   4.1  --  4.8   4.8 3.9  CL 103   < > 104   < > 103   < >  --  107   107  --  105   105 105  CO2 25   < > 23   < > 25   < >  --  25*   25*  --  20   20 23*  GLUCOSE 178*  --  125*  --  104*  --   --   --   --   --   --   BUN 27*   < > 20   < > 19   < >  --  24*   24*  --  14   14 18   CREATININE 1.44*   < > 1.32*   < > 1.01   < >  --  0.8   0.8  --  0.9   0.9 0.8  CALCIUM 10.3  --  10.1   < > 9.6   < >   < >  --  10.5 10.6   10.6 10.0   < > = values in this interval not displayed.   Recent Labs    05/01/19 1037 05/19/19 0000  AST 26 19  ALT 21 19  ALKPHOS 65 77  BILITOT 1.5*  --   PROT 6.8  --   ALBUMIN 4.0  --     Recent Labs    05/19/19 0000 06/02/19 0132 06/02/19 0132 07/28/19 0000 07/28/19 0000 10/06/19 1010 11/14/19 0000 11/17/19 0700 11/21/19 1340 03/02/20 0000 03/05/20 0000  WBC   < > 4.1   < > 2.7   < > 2.4* 9.4   < > 2.7* 5.0 3.2   3.2  NEUTROABS  --  3.0  --  2  --  1.4* 8  --   --   --   --   HGB   < > 15.0   < > 13.9   < > 13.7 13.2*   < > 13.6 15.0 14.4   14.4  HCT   < > 46.2   < > 42   < > 43.9 39*   < > 43.6 45 43  MCV  --  93.9  --   --   --  94.0  --   --  93.8  --   --   PLT   < > 78*   < > 85*   < > 109* 133*   < > 154 93* 113*   113*   < > = values in this interval not displayed.   Lab Results  Component Value Date  TSH 0.84 05/19/2019   Lab Results  Component Value Date   HGBA1C 6 08/22/2019   Lab Results  Component Value Date   CHOL 123 05/19/2019   HDL 30 (A) 05/19/2019   LDLCALC 62 05/19/2019   TRIG 154 05/19/2019    Significant Diagnostic Results in last 30 days:  No results found.  Assessment/Plan  1. Longstanding persistent atrial fibrillation ([place]) Rate controlled with out medication.   2. Chronic anticoagulation Continues on Coumadin with no s/e Continue coumadin 6 mg qd and recheck INR in 2 weeks   3. Urinary retention due to benign prostatic hyperplasia Continue s/p cath [facility] Continue methenamine 1 gram bid  4. Vascular dementia without behavioral disturbance (HCC) Moderate  Currently on Cymbalta with no s/s of depression or pain.  5. Other hyperlipidemia Lab Results  Component Value Date   LDLCALC 62 05/19/2019   Continue Lipitor 20 mg qd   6. Renal mass, right CT 06/16/18 revealed right upper pole renal mass measuring 3.8 x 2.2 cm with no adenopathy or renal vein involvement.  Followed urology     Labs/tests ordered:  NA

## 2020-03-28 DIAGNOSIS — Z20828 Contact with and (suspected) exposure to other viral communicable diseases: Secondary | ICD-10-CM | POA: Diagnosis not present

## 2020-03-28 DIAGNOSIS — Z9189 Other specified personal risk factors, not elsewhere classified: Secondary | ICD-10-CM | POA: Diagnosis not present

## 2020-03-30 ENCOUNTER — Encounter: Payer: Self-pay | Admitting: Internal Medicine

## 2020-03-30 ENCOUNTER — Telehealth: Payer: Self-pay

## 2020-03-30 NOTE — Telephone Encounter (Signed)
I called to give him labs from 03/12/2020 and to check to see if he had a recheck of his INR on 03/26/2020 . I left a message on the voicemail. The note with the labs states Continue current dose of coumadin 6mg  PO daily. Recheck PT/INR in 2 weeks 03/26/2020... to Ok Edwards /NP

## 2020-04-02 NOTE — Telephone Encounter (Signed)
He lives in [place].

## 2020-04-03 DIAGNOSIS — Z20828 Contact with and (suspected) exposure to other viral communicable diseases: Secondary | ICD-10-CM | POA: Diagnosis not present

## 2020-04-03 DIAGNOSIS — Z9189 Other specified personal risk factors, not elsewhere classified: Secondary | ICD-10-CM | POA: Diagnosis not present

## 2020-04-09 ENCOUNTER — Encounter: Payer: Self-pay | Admitting: Adult Health

## 2020-04-09 ENCOUNTER — Non-Acute Institutional Stay (SKILLED_NURSING_FACILITY): Payer: Medicare Other | Admitting: Adult Health

## 2020-04-09 DIAGNOSIS — Z9189 Other specified personal risk factors, not elsewhere classified: Secondary | ICD-10-CM | POA: Diagnosis not present

## 2020-04-09 DIAGNOSIS — Z7901 Long term (current) use of anticoagulants: Secondary | ICD-10-CM

## 2020-04-09 DIAGNOSIS — I4811 Longstanding persistent atrial fibrillation: Secondary | ICD-10-CM

## 2020-04-09 DIAGNOSIS — Z20828 Contact with and (suspected) exposure to other viral communicable diseases: Secondary | ICD-10-CM | POA: Diagnosis not present

## 2020-04-09 LAB — PROTIME-INR: Protime: 28.7 — AB (ref 10.0–13.8)

## 2020-04-09 LAB — POCT INR
INR: 2.6 — AB (ref 0.9–1.1)
INR: 2.6 — AB (ref 0.9–1.1)
INR: 2.6 — AB (ref <=1.1)

## 2020-04-09 NOTE — Progress Notes (Signed)
Location:  Occupational psychologist of Service:  SNF (31) Provider:  Cindi Carbon, ANP [place] [phone]   Gayland Curry, DO  Patient Care Team: Gayland Curry, DO as PCP - General (Geriatric Medicine) [name], MD as PCP - Cardiology (Cardiology)  Extended Emergency Contact Information Primary Emergency Contact: Standen,[name] Address: [address]          [place],  [address] [name] of [place] Phone: [phone] Mobile Phone: [phone] Relation: Spouse Secondary Emergency Contact: Boney,Honorio Address: [place] [address] [name] of [place] Phone: [phone] Mobile Phone: [phone] Relation: Son  Code Status:  DNR Goals of care: Advanced Directive information Advanced Directives 11/21/2019  Does Patient Have a Medical Advance Directive? Yes  Type of Paramedic of [place];Living will  Does patient want to make changes to medical advance directive? -  Copy of [place] in Chart? No - copy requested  Pre-existing out of facility DNR order (yellow form or pink MOST form) -     Chief Complaint  Patient presents with  . Acute Visit    anticoaguation    HPI:  Pt is a 86 y.o. male seen today for an acute visit for anticoagulation. He is currently on coumadin at 6 mg qd with no reports of bleeding or bruising.  Lab Results  Component Value Date   INR 2.6 (A) 04/09/2020   INR 3.0 (A) 03/26/2020   INR 2.6 (A) 03/12/2020   PROTIME 28.7 (A) 04/09/2020   PROTIME 25.5 (A) 03/05/2020   PROTIME 23.1 (A) 02/20/2020    CHA2DS2-VASc Score = 5      Past Medical History:  Diagnosis Date  . Adult failure to thrive    Per incoming records from [facility]  . Allergic rhinitis    Per incoming records from [facility]  . Atrial fibrillation ([place])   . BPH (benign prostatic  hyperplasia)    Per incoming records from [facility]  . Cancer of kidney [facility])    Right, Per incoming records from [facility]  . Cataract    Per incoming records from [facility]  . Cerebrovascular accident, late effects    Per incoming records from [facility]  . Chronic kidney disease, stage III (moderate) ([place])    Per [facility] records from [facility]  . Claudication [facility])    Per incoming records from [facility]  . Cognitive impairment    Mild, Per incoming records from [facility]  . Constipation    Per incoming records from [facility]  . Dementia ([place])   . Diarrhea    Better off Aricept, Per incoming records from [facility]  . Diastolic dysfunction    on 2D Echo 07/2009 and 2016, Per incoming records from [facility]  . Diverticulosis    Mild, Per incoming records from [facility]  . Diverticulosis of colon (without mention of hemorrhage)   . ED (erectile dysfunction)    Per incoming records from [facility]  . History of Coumadin therapy    Per incoming records from [facility]  . History of echocardiogram 09/2014   Per incoming records from [facility]  . Hyperlipemia   . Hypertension   . Kidney mass    Per incoming records from [facility]  . Lower leg edema    Per incoming records from [facility]  . Melanoma ([place])   . Neuropathy    Per incoming records from [facility]  . OA (osteoarthritis)    Per incoming records from [facility]  . Peripheral neuropathy    Per incoming records from [facility]  . Personal history of colonic polyps 1999 & 2004   adenomatous polyps  . Pulmonary arterial hypertension ([place])    Per incoming records from [facility]  . Rosacea    Per incoming records from [facility]  . Thrombocytopenia ([place]) 2017   Per incoming records from [facility]  . UTI (urinary tract infection)    Sepsis, Per incoming records from [facility]  . Ventricular hypertrophy   . Walker as ambulation aid    Per incoming records from [employer]   Past Surgical History:  Procedure Laterality Date  . APPENDECTOMY     Per incoming records from [facility]  . CATARACT EXTRACTION     Per incoming records from [facility]  . IR CATHETER TUBE CHANGE  11/21/2019  . KNEE SURGERY     right  . PILONIDAL CYST DRAINAGE    . POLYPECTOMY     Per incoming records from [facility]  . schwanoma tumor lumbar spine    . SPINE SURGERY     tumor removed  . THORACIC 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, Per incoming records from [facility]  . TONSILLECTOMY AND ADENOIDECTOMY      Allergies  Allergen Reactions  . Penicillins Rash    Has patient had a PCN reaction causing immediate rash, facial/tongue/throat swelling, SOB or lightheadedness with hypotension: unknown Has patient had a PCN reaction causing severe rash involving mucus membranes or skin necrosis: unknown Has patient had a PCN reaction that required hospitalization : unknown Has patient had a PCN reaction occurring within the last 10 years: no If all of the above answers are "NO", then may proceed with Cephalosporin use.     Outpatient Encounter Medications as of 04/09/2020  Medication Sig  . acetaminophen (TYLENOL) 500 MG tablet Take 500 mg by mouth 2 (two) times daily. Scheduled and every 4 hours as needed (ending 05/05/2019)Document area of discomfort and effectiveness of medication.  Marland Kitchen atorvastatin (LIPITOR) 20 MG tablet Take 20 mg by mouth every evening.   . cholecalciferol (VITAMIN D3) 25 MCG (1000 UT) tablet Take  2,000 Units by mouth daily.   . DULoxetine (CYMBALTA) 30 MG capsule Take 30 mg by mouth daily.  . methenamine (MANDELAMINE) 1 g tablet Take 1,000 mg by mouth 2 (two) times daily.  . mineral oil-hydrophilic petrolatum (AQUAPHOR) ointment Apply topically daily. Right cheek and scalp  . polyethylene glycol (MIRALAX / GLYCOLAX) 17 g packet Take 17 g by mouth daily.  Marland Kitchen triamcinolone lotion (KENALOG) 0.1 % Apply 1 application topically 2 (two) times daily as needed.   . warfarin (COUMADIN) 6 MG tablet Take 6 mg by mouth daily.   No facility-administered encounter medications on file as of 04/09/2020.    Review of Systems  Constitutional: Negative for activity change, appetite change, chills, diaphoresis, fatigue, fever and unexpected weight change.  Cardiovascular: Positive for leg swelling. Negative for chest pain and palpitations.  Hematological: Does not bruise/bleed easily.    Immunization History  Administered Date(s) Administered  . Influenza-Unspecified 04/17/2014, 05/01/2016, 04/15/2019  .  Moderna SARS-COVID-2 Vaccination 07/12/2019, 08/09/2019  . Pneumococcal Conjugate-13 04/20/2014  . Pneumococcal Polysaccharide-23 07/08/2003, 03/31/2011  . Pneumococcal-Unspecified 01/26/2004  . Td 08/23/2007  . Tdap 01/07/2012  . Zoster 07/17/2005  . Zoster Recombinat (Shingrix) 10/05/2017, 12/09/2017   Pertinent  Health Maintenance Due  Topic Date Due  . INFLUENZA VACCINE  02/05/2020  . PNA vac Low Risk Adult  Completed   Fall Risk  10/30/2019 06/23/2019 05/07/2019 05/03/2019  Falls in the past year? - 1 - -  Number falls in past yr: - 1 - -  Injury with Fall? - 1 - -  Risk for fall due to : History of fall(s);Impaired balance/gait;Impaired mobility;Mental status change History of fall(s);Impaired balance/gait;Impaired mobility History of fall(s);Impaired balance/gait;Impaired mobility;Mental status change;Medication side effect History of fall(s);Impaired balance/gait;Impaired  mobility;Medication side effect;Mental status change  Follow up Falls evaluation completed;Education provided;Falls prevention discussed Falls evaluation completed Falls evaluation completed;Education provided;Falls prevention discussed Falls evaluation completed;Education provided;Falls prevention discussed  Comment - - [facility] at [place]:    There were no vitals filed for this visit. There is no height or weight on file to calculate BMI. Physical Exam Vitals and nursing note reviewed.  Neurological:     Mental Status: He is alert. Mental status is at baseline.  Psychiatric:        Mood and Affect: Mood normal.     Labs reviewed: Recent Labs    05/01/19 1037 05/19/19 0000 06/02/19 0132 07/28/19 0000 10/06/19 1010 11/14/19 0000 11/17/19 0700 11/21/19 0000 11/21/19 0600 12/08/19 0000 03/02/20 0000  NA 140   < > 138   < > 137   < >  --  144  144  --  141  141 139  K 4.4   < > 3.8   < > 4.0   < >  --  4.1  4.1  --  4.8  4.8 3.9  CL 103   < > 104   < > 103   < >  --  107  107  --  105  105 105  CO2 25   < > 23   < > 25   < >  --  25*  25*  --  20  20 23*  GLUCOSE 178*  --  125*  --  104*  --   --   --   --   --   --   BUN 27*   < > 20   < > 19   < >  --  24*  24*  --  14  14 18   CREATININE 1.44*   < > 1.32*   < > 1.01   < >  --  0.8  0.8  --  0.9  0.9 0.8  CALCIUM 10.3  --  10.1   < > 9.6   < >   < >  --  10.5 10.6  10.6 10.0   < > = values in this interval not displayed.   Recent Labs    05/01/19 1037 05/19/19 0000  AST 26 19  ALT 21 19  ALKPHOS 65 77  BILITOT 1.5*  --   PROT 6.8  --   ALBUMIN 4.0  --    Recent Labs    05/19/19 0000 06/02/19 0132 06/02/19 0132 07/28/19 0000 07/28/19 0000 10/06/19 1010 11/14/19 0000 11/17/19 0700 11/21/19 1340 03/02/20 0000 03/05/20 0000  WBC   < > 4.1   < >  2.7   < > 2.4* 9.4   < > 2.7* 5.0 3.2  3.2  NEUTROABS  --  3.0  --  2  --  1.4* 8  --   --    --   --   HGB   < > 15.0   < > 13.9   < > 13.7 13.2*   < > 13.6 15.0 14.4  14.4  HCT   < > 46.2   < > 42   < > 43.9 39*   < > 43.6 45 43  MCV  --  93.9  --   --   --  94.0  --   --  93.8  --   --   PLT   < > 78*   < > 85*   < > 109* 133*   < > 154 93* 113*  113*   < > = values in this interval not displayed.   Lab Results  Component Value Date   TSH 0.84 05/19/2019   Lab Results  Component Value Date   HGBA1C 6 08/22/2019   Lab Results  Component Value Date   CHOL 123 05/19/2019   HDL 30 (A) 05/19/2019   LDLCALC 62 05/19/2019   TRIG 154 05/19/2019    Significant Diagnostic Results in last 30 days:  No results found.  Assessment/Plan 1. Longstanding persistent atrial fibrillation (HCC) Rate is controlled without medications   2. Chronic anticoagulation INR therapeutic Continue coumadin at 6 mg qd and recheck in 2 weeks for CVA risk reduction    Family/ staff Communication: nurse Labs/tests ordered:  INR 2 weeks

## 2020-04-12 ENCOUNTER — Encounter: Payer: Self-pay | Admitting: Internal Medicine

## 2020-04-16 DIAGNOSIS — Z9189 Other specified personal risk factors, not elsewhere classified: Secondary | ICD-10-CM | POA: Diagnosis not present

## 2020-04-16 DIAGNOSIS — Z20828 Contact with and (suspected) exposure to other viral communicable diseases: Secondary | ICD-10-CM | POA: Diagnosis not present

## 2020-04-19 ENCOUNTER — Non-Acute Institutional Stay (SKILLED_NURSING_FACILITY): Payer: Medicare Other | Admitting: Adult Health

## 2020-04-19 ENCOUNTER — Encounter: Payer: Self-pay | Admitting: Adult Health

## 2020-04-19 DIAGNOSIS — R338 Other retention of urine: Secondary | ICD-10-CM | POA: Diagnosis not present

## 2020-04-19 DIAGNOSIS — I4811 Longstanding persistent atrial fibrillation: Secondary | ICD-10-CM | POA: Diagnosis not present

## 2020-04-19 DIAGNOSIS — N401 Enlarged prostate with lower urinary tract symptoms: Secondary | ICD-10-CM | POA: Diagnosis not present

## 2020-04-19 DIAGNOSIS — I5032 Chronic diastolic (congestive) heart failure: Secondary | ICD-10-CM | POA: Diagnosis not present

## 2020-04-19 DIAGNOSIS — F0150 Vascular dementia without behavioral disturbance: Secondary | ICD-10-CM | POA: Diagnosis not present

## 2020-04-19 DIAGNOSIS — Z9359 Other cystostomy status: Secondary | ICD-10-CM

## 2020-04-19 DIAGNOSIS — K5901 Slow transit constipation: Secondary | ICD-10-CM | POA: Diagnosis not present

## 2020-04-19 NOTE — Progress Notes (Signed)
Location:  Occupational psychologist of Service:  SNF (31) Provider:   Cindi Carbon, ANP [facility] [phone]   Peter Singh, Peter Singh  Patient Care Team: Peter Singh, Peter Singh as PCP - General (Geriatric Medicine) [name], MD as PCP - Cardiology (Cardiology)  Extended Emergency Contact Information Primary Emergency Contact: Peter Singh,Peter Singh Address: [address]          [place], [place] [address] [name] of [place] Phone: [phone] Mobile Phone: [phone] Relation: Spouse Secondary Emergency Contact: Peter Singh,Peter Singh Address: [place] [address] [name] of [place] Phone: [phone] Mobile Phone: [phone] Relation: Son  Code Status:  DNR Goals of care: Advanced Directive information Advanced Directives 11/21/2019  Does Patient Have a Medical Advance Directive? Yes  Type of Paramedic of [place];Living will  Does patient want to make changes to medical advance directive? -  Copy of [place] in Chart? No - copy requested  Pre-existing out of facility DNR order (yellow form or pink MOST form) -     Chief Complaint  Patient presents with   Medical Management of Chronic Issues    HPI:  Pt is a 86 y.o. male seen today for medical management of chronic diseases.    Peter Singh resides in skilled care due to progressive dementia. He remains ambulatory with a walker and participates in activities. He is quite pleasant and there are no acute concerns regarding his care. Occasionally he makes inappropriate comments to the staff in a flirtatious way. He is on Cymbalta for depression and will be starting a GDR as his mood has been stable with no s/s of depression.   He has a s/p cath due to urinary retention and periodically this bleeds as he is on Coumadin for afib.   No change in weight, edema, sob, cp, etc.   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 which was treated with Effudex  Past Medical History:  Diagnosis Date   Adult failure to thrive    Per incoming records from [facility]   Allergic rhinitis    Per incoming records from [facility]   Atrial fibrillation [facility])    BPH (benign prostatic hyperplasia)    Per incoming records from [place] of kidney [facility])    Right, Per incoming records from [place]    Per incoming records from [facility]   Cerebrovascular accident, late effects    Per incoming records from [place]   Chronic kidney disease, stage III (moderate) ([place])    Per incoming records from [place] [facility])    Per incoming records from [facility]   Cognitive impairment    Mild, Per incoming records from [facility]   Constipation    Per incoming records from [facility]   Dementia [facility])    Diarrhea    Better off Aricept, Per incoming records from [facility]   Diastolic dysfunction    on 2D Echo 07/2009 and 2016, Per incoming records from [facility]   Diverticulosis    Mild, Per incoming records from [facility]   Diverticulosis of colon (without mention of hemorrhage)    ED (erectile dysfunction)    Per incoming records  from [facility]   History of Coumadin therapy    Per incoming records from [facility]   History of echocardiogram 09/2014   Per incoming records from [facility]   Hyperlipemia    Hypertension    Kidney mass    Per incoming records from [place] leg edema    Per incoming records from [place])    Neuropathy    Per incoming records from [place] (osteoarthritis)    Per incoming records from [facility]   Peripheral neuropathy    Per incoming records from [place] history of colonic [place] & 2004   adenomatous polyps   Pulmonary arterial hypertension ([place])    Per incoming records from [place]    Per incoming records from [facility]   Thrombocytopenia [facility]) 2017   Per incoming records from [facility]   UTI (urinary tract infection)    Sepsis, Per incoming records from [place] as ambulation aid    Per incoming records from [facility]   Past Surgical History:  Procedure Laterality Date   APPENDECTOMY     Per incoming records from [place]     Per incoming records from [place]  11/21/2019   KNEE SURGERY     right   PILONIDAL CYST DRAINAGE     POLYPECTOMY     Per incoming records from [place] tumor lumbar spine     SPINE SURGERY     tumor removed   THORACIC 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, Per incoming records from [place]      Allergies  Allergen Reactions   Penicillins Rash    Has patient had a PCN reaction causing immediate rash, facial/tongue/throat swelling, SOB or lightheadedness with hypotension: unknown Has patient had a PCN reaction causing severe rash involving mucus membranes or skin necrosis: unknown Has patient had a PCN reaction that required hospitalization : unknown Has patient had a PCN reaction occurring within the last 10 years: no If all of the above answers are "NO", then may proceed with Cephalosporin use.     Outpatient Encounter Medications as of 04/19/2020  Medication Sig     DULoxetine (CYMBALTA) 20 MG capsule Take 20 mg by mouth daily.   acetaminophen (TYLENOL) 500 MG tablet Take 500 mg by mouth 2 (two) times daily. Scheduled and every 4 hours as needed (ending 05/05/2019)Document area of discomfort and effectiveness of medication.   atorvastatin (LIPITOR) 20 MG tablet Take 20 mg by mouth every evening.    cholecalciferol (VITAMIN D3) 25 MCG (1000 UT) tablet Take 2,000 Units by mouth daily.    methenamine (MANDELAMINE) 1 g tablet Take 1,000 mg by mouth 2 (two) times daily.   mineral oil-hydrophilic petrolatum (AQUAPHOR) ointment Apply topically daily. Right cheek and scalp   polyethylene glycol (MIRALAX / GLYCOLAX) 17 g packet Take 17 g by mouth daily.   triamcinolone lotion (KENALOG) 0.1 % Apply 1 application topically 2 (two) times daily as needed.    warfarin (COUMADIN) 6 MG tablet Take 6 mg by mouth daily.   [DISCONTINUED] DULoxetine (CYMBALTA) 30 MG capsule Take 30 mg  by mouth daily.   No facility-administered encounter medications on file as of 04/19/2020.    Review of Systems  Constitutional: Negative for activity change, appetite change, chills, diaphoresis, fatigue, fever and unexpected weight change.  Respiratory: Negative for cough, shortness of breath, wheezing and stridor.   Cardiovascular: Positive for leg swelling. Negative for chest pain and palpitations.  Gastrointestinal: Negative for abdominal distention, abdominal pain, constipation and diarrhea.  Genitourinary: Negative for difficulty urinating, discharge, dysuria and hematuria.       Cath site bleeds periodically  Musculoskeletal: Positive for gait problem. Negative for arthralgias, back pain, joint swelling and myalgias.  Neurological: Negative for dizziness, seizures, syncope, facial asymmetry, speech difficulty, weakness and headaches.  Hematological: Negative for adenopathy. Does not bruise/bleed easily.  Psychiatric/Behavioral: Positive for confusion. Negative for  agitation and behavioral problems.    Immunization History  Administered Date(s) Administered   Influenza-Unspecified 04/17/2014, 05/01/2016, 04/15/2019   Moderna SARS-COVID-2 Vaccination 07/12/2019, 08/09/2019   Pneumococcal Conjugate-13 04/20/2014   Pneumococcal Polysaccharide-23 07/08/2003, 03/31/2011   Pneumococcal-Unspecified 01/26/2004   Td 08/23/2007   Tdap 01/07/2012   Zoster 07/17/2005   Zoster Recombinat (Shingrix) 10/05/2017, 12/09/2017   Pertinent  Health Maintenance Due  Topic Date Due   INFLUENZA VACCINE  02/05/2020   PNA vac Low Risk Adult  Completed   Fall Risk  10/30/2019 06/23/2019 05/07/2019 05/03/2019  Falls in the past year? - 1 - -  Number falls in past yr: - 1 - -  Injury with Fall? - 1 - -  Risk for fall due to : History of fall(s);Impaired balance/gait;Impaired mobility;Mental status change History of fall(s);Impaired balance/gait;Impaired mobility History of fall(s);Impaired balance/gait;Impaired mobility;Mental status change;Medication side effect History of fall(s);Impaired balance/gait;Impaired mobility;Medication side effect;Mental status change  Follow up Falls evaluation completed;Education provided;Falls prevention discussed Falls evaluation completed Falls evaluation completed;Education provided;Falls prevention discussed Falls evaluation completed;Education provided;Falls prevention discussed  Comment - - [facility] at [facility] all addressed at [place]:    Vitals:   04/19/20 1410  Weight: 260 lb 3.2 oz (118 kg)   Body mass index is 29.31 kg/m. Physical Exam Vitals and nursing note reviewed.  Constitutional:      General: He is not in acute distress.    Appearance: He is not diaphoretic.  HENT:     Head: Normocephalic and atraumatic.  Neck:     Thyroid: No thyromegaly.     Vascular: No JVD.     Trachea: No tracheal deviation.  Cardiovascular:     Rate and Rhythm: Normal rate. Rhythm irregular.      Heart sounds: No murmur heard.   Pulmonary:     Effort: Pulmonary effort is normal. No respiratory distress.     Breath sounds: Normal breath sounds. No wheezing.  Abdominal:     General: Bowel sounds are normal. There is no distension.     Palpations: Abdomen is soft.     Tenderness: There is no abdominal tenderness.  Musculoskeletal:     Right lower leg: Edema (+1) present.     Left lower leg: Edema (+1) present.  Lymphadenopathy:     Cervical: No cervical adenopathy.  Skin:    General: Skin is warm and dry.  Neurological:     General: No focal deficit present.     Mental Status: He is alert. Mental status is at baseline.     Cranial Nerves: No cranial nerve deficit.     Comments: Oriented x 2     Labs reviewed: Recent Labs  05/01/19 1037 05/19/19 0000 06/02/19 0132 07/28/19 0000 10/06/19 1010 11/14/19 0000 11/17/19 0700 11/21/19 0000 11/21/19 0600 12/08/19 0000 03/02/20 0000  NA 140   < > 138   < > 137   < >  --  144   144  --  141   141 139  K 4.4   < > 3.8   < > 4.0   < >  --  4.1   4.1  --  4.8   4.8 3.9  CL 103   < > 104   < > 103   < >  --  107   107  --  105   105 105  CO2 25   < > 23   < > 25   < >  --  25*   25*  --  20   20 23*  GLUCOSE 178*  --  125*  --  104*  --   --   --   --   --   --   BUN 27*   < > 20   < > 19   < >  --  24*   24*  --  14   14 18   CREATININE 1.44*   < > 1.32*   < > 1.01   < >  --  0.8   0.8  --  0.9   0.9 0.8  CALCIUM 10.3  --  10.1   < > 9.6   < >   < >  --  10.5 10.6   10.6 10.0   < > = values in this interval not displayed.   Recent Labs    05/01/19 1037 05/19/19 0000  AST 26 19  ALT 21 19  ALKPHOS 65 77  BILITOT 1.5*  --   PROT 6.8  --   ALBUMIN 4.0  --    Recent Labs    05/19/19 0000 06/02/19 0132 06/02/19 0132 07/28/19 0000 07/28/19 0000 10/06/19 1010 11/14/19 0000 11/17/19 0700 11/21/19 1340 03/02/20 0000 03/05/20 0000  WBC   < > 4.1   < > 2.7   < > 2.4* 9.4   < > 2.7* 5.0 3.2   3.2  NEUTROABS  --   3.0  --  2  --  1.4* 8  --   --   --   --   HGB   < > 15.0   < > 13.9   < > 13.7 13.2*   < > 13.6 15.0 14.4   14.4  HCT   < > 46.2   < > 42   < > 43.9 39*   < > 43.6 45 43  MCV  --  93.9  --   --   --  94.0  --   --  93.8  --   --   PLT   < > 78*   < > 85*   < > 109* 133*   < > 154 93* 113*   113*   < > = values in this interval not displayed.   Lab Results  Component Value Date   TSH 0.84 05/19/2019   Lab Results  Component Value Date   HGBA1C 6 08/22/2019   Lab Results  Component Value Date   CHOL 123 05/19/2019   HDL 30 (A) 05/19/2019   LDLCALC 62 05/19/2019   TRIG 154 05/19/2019    Significant Diagnostic Results in last 30 days:  No results found.  Assessment/Plan  1. Urinary retention due to benign prostatic hyperplasia Followed by urology Continue cath changes per [facility] staff.  If cath site has continued bleeding recommend call to urology. Stoma may benefit from silver nitrate.   2. Vascular dementia without behavioral disturbance (HCC) Moderate  Doing well in skilled care Monitor due to dose reduction in Cymbalta  3. Longstanding persistent atrial fibrillation (HCC) Rate is controlled without meds Continues on coumadin for CVA risk reduction   4. Chronic diastolic heart failure (HCC) Appears compensated Continue compression hose and leg elevation.   5. Suprapubic catheter ([place]) As above  6. Slow transit constipation Continue miralax daily  Labs/tests ordered:  NA

## 2020-04-20 DIAGNOSIS — K5901 Slow transit constipation: Secondary | ICD-10-CM | POA: Insufficient documentation

## 2020-04-23 DIAGNOSIS — Z7901 Long term (current) use of anticoagulants: Secondary | ICD-10-CM | POA: Diagnosis not present

## 2020-04-23 LAB — POCT INR: INR: 3.6 — AB (ref 0.9–1.1)

## 2020-04-24 DIAGNOSIS — Z7901 Long term (current) use of anticoagulants: Secondary | ICD-10-CM | POA: Diagnosis not present

## 2020-04-24 LAB — POCT INR: INR: 3.3 — AB (ref 0.9–1.1)

## 2020-04-27 DIAGNOSIS — Z23 Encounter for immunization: Secondary | ICD-10-CM | POA: Diagnosis not present

## 2020-04-27 DIAGNOSIS — Z7901 Long term (current) use of anticoagulants: Secondary | ICD-10-CM | POA: Diagnosis not present

## 2020-04-30 ENCOUNTER — Encounter: Payer: Self-pay | Admitting: Internal Medicine

## 2020-05-04 DIAGNOSIS — Z7901 Long term (current) use of anticoagulants: Secondary | ICD-10-CM | POA: Diagnosis not present

## 2020-05-17 DIAGNOSIS — Z23 Encounter for immunization: Secondary | ICD-10-CM | POA: Diagnosis not present

## 2020-05-18 DIAGNOSIS — Z7901 Long term (current) use of anticoagulants: Secondary | ICD-10-CM | POA: Diagnosis not present

## 2020-05-18 LAB — PROTIME-INR: Protime: 27.7 — AB (ref 10.0–13.8)

## 2020-05-18 LAB — POCT INR: INR: 2.5 — AB (ref <=1.1)

## 2020-05-21 ENCOUNTER — Encounter: Payer: Self-pay | Admitting: Internal Medicine

## 2020-05-30 ENCOUNTER — Encounter: Payer: Self-pay | Admitting: Adult Health

## 2020-05-30 ENCOUNTER — Non-Acute Institutional Stay (SKILLED_NURSING_FACILITY): Payer: Medicare Other | Admitting: Adult Health

## 2020-05-30 DIAGNOSIS — I5032 Chronic diastolic (congestive) heart failure: Secondary | ICD-10-CM

## 2020-05-30 DIAGNOSIS — N2889 Other specified disorders of kidney and ureter: Secondary | ICD-10-CM | POA: Diagnosis not present

## 2020-05-30 DIAGNOSIS — E7849 Other hyperlipidemia: Secondary | ICD-10-CM

## 2020-05-30 DIAGNOSIS — Z9359 Other cystostomy status: Secondary | ICD-10-CM

## 2020-05-30 DIAGNOSIS — D696 Thrombocytopenia, unspecified: Secondary | ICD-10-CM

## 2020-05-30 DIAGNOSIS — I4811 Longstanding persistent atrial fibrillation: Secondary | ICD-10-CM | POA: Diagnosis not present

## 2020-05-30 DIAGNOSIS — R338 Other retention of urine: Secondary | ICD-10-CM

## 2020-05-30 DIAGNOSIS — N401 Enlarged prostate with lower urinary tract symptoms: Secondary | ICD-10-CM | POA: Diagnosis not present

## 2020-05-30 NOTE — Progress Notes (Signed)
Location:  Occupational psychologist of Service:  SNF (31) Provider:   Cindi Singh, ANP [place] [phone]   Peter Curry, DO  Patient Care Team: Peter Curry, DO as PCP - General (Geriatric Medicine) Peter Crome, MD as PCP - Cardiology (Cardiology)  Extended Emergency Contact Information Primary Emergency Contact: Peter Singh,Peter Singh Address: [address]          [place], [place] [address] Peter Singh of [place] Phone: [phone] Mobile Phone: [phone] Relation: Spouse Secondary Emergency Contact: Peter Singh,Peter Singh Address: [place] [address] Peter Singh of [place] Phone: [phone] Mobile Phone: [phone] Relation: Son  Code Status:  DNR Goals of care: Advanced Directive information Advanced Directives 11/21/2019  Does Patient Have a Medical Advance Directive? Yes  Type of Paramedic of [address];Living will  Does patient want to make changes to medical advance directive? -  Copy of [place] in Chart? No - copy requested  Pre-existing out of facility DNR order (yellow form or pink MOST form) -     Chief Complaint  Patient presents with  . Medical Management of Chronic Issues    HPI:  Pt is a 86 y.o. male seen today for medical management of chronic diseases.    Peter Singh resides in skilled care due to progressive dementia with prior hx of CVA. Currently on Eliquis for CVA risk reduction associated with afib. No current issues with bruising or bleeding. No issues with weight gain, doe, or palpitations. Does carry chronic edema in both legs which is unchanged. See cardiology (last in April of 2021)  Right renal mass: noted on CT in 2019.  Followed by Urology No current symptoms of back pain or fever  Continues with s/p cath due to urinary retention with BPH. Urine output adequate. No burning, bladder pain, etc.   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 Alen Singh..   Lab Results  Component Value Date   PLT 113 (A) 03/05/2020   PLT 113 (A) 03/05/2020    Past Medical History:  Diagnosis Date  . Adult failure to thrive    Per incoming records from [facility]  . Allergic rhinitis    Per incoming records from [facility]  . Atrial fibrillation ([place])   . BPH (benign prostatic hyperplasia)    Per incoming records from [facility]  . Cancer of kidney [facility])    Right, Per incoming records from [facility]  . Cataract    Per incoming records from [facility]  . Cerebrovascular accident, late effects    Per incoming records from [facility]  . Chronic kidney disease, stage III (moderate) ([place])    Per incoming records from [facility]  . Claudication [facility])    Per incoming records from [facility]  . Cognitive impairment    Mild, Per incoming records from [facility]  . Constipation    Per incoming records from [facility]  . Dementia ([place])   . Diarrhea    Better off Aricept, Per incoming records from [facility]  . Diastolic dysfunction    on 2D Echo 07/2009 and 2016, Per incoming records from [facility]  . Diverticulosis    Mild, Per incoming records from [facility]  . Diverticulosis of colon (without mention of hemorrhage)   .  ED (erectile dysfunction)    Per incoming records from [facility]  . History of Coumadin therapy    Per incoming records from [facility]  . History of echocardiogram 09/2014   Per incoming records from [facility]  . Hyperlipemia   . Hypertension   . Kidney mass    Per incoming records from [facility]  . Lower leg edema    Per incoming records from [facility]  . Melanoma ([place])   . Neuropathy    Per incoming records from [facility]  . OA (osteoarthritis)    Per incoming records from [facility]  . Peripheral neuropathy    Per incoming records from [facility]  . Personal history of colonic polyps 1999 & 2004   adenomatous polyps  . Pulmonary arterial hypertension ([place])    Per incoming records from [facility]  . Rosacea    Per incoming records from [facility]  . Thrombocytopenia ([place]) 2017   Per incoming records from [facility]  . UTI (urinary tract infection)    Sepsis, Per incoming records from [facility]  . Ventricular hypertrophy   . Walker as ambulation aid    Per incoming records from [employer]   Past Surgical History:  Procedure Laterality Date  . APPENDECTOMY     Per incoming records from [facility]  . CATARACT EXTRACTION     Per incoming records from [facility]  . IR CATHETER TUBE CHANGE  11/21/2019  . KNEE SURGERY     right  . PILONIDAL CYST DRAINAGE    . POLYPECTOMY     Per incoming records from [facility]  . schwanoma tumor lumbar spine    . SPINE SURGERY     tumor removed  . THORACIC 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, Per incoming records from [facility]  . TONSILLECTOMY AND ADENOIDECTOMY      Allergies  Allergen Reactions  . Penicillins Rash    Has patient had a PCN reaction causing immediate rash, facial/tongue/throat swelling, SOB or lightheadedness with hypotension: unknown Has patient had a PCN reaction causing severe rash involving mucus membranes or skin necrosis: unknown Has patient had a PCN reaction that required hospitalization : unknown Has patient had a PCN reaction occurring within the last 10 years: no If all of the above answers are "NO", then may  proceed with Cephalosporin use.     Outpatient Encounter Medications as of 05/30/2020  Medication Sig  . acetaminophen (TYLENOL) 500 MG tablet Take 500 mg by mouth 2 (two) times daily. Scheduled and every 4 hours as needed (ending 05/05/2019)Document area of discomfort and effectiveness of medication.  Marland Kitchen atorvastatin (LIPITOR) 20 MG tablet Take 20 mg by mouth every evening.   . cholecalciferol (VITAMIN D3) 25 MCG (1000 UT) tablet Take 2,000 Units by mouth daily.   . DULoxetine (CYMBALTA) 20 MG capsule Take 20 mg by mouth daily.  . methenamine (MANDELAMINE) 1 g tablet Take 1,000 mg by mouth 2 (two) times daily.  . mineral oil-hydrophilic petrolatum (AQUAPHOR) ointment Apply topically daily. Right cheek and scalp  . polyethylene glycol (MIRALAX / GLYCOLAX) 17 g packet Take 17 g by mouth daily.  Marland Kitchen triamcinolone lotion (KENALOG) 0.1 % Apply 1 application topically 2 (two) times daily as needed.   . warfarin (COUMADIN) 5 MG tablet Take 5 mg by mouth daily.  . [DISCONTINUED]  warfarin (COUMADIN) 6 MG tablet Take 6 mg by mouth daily.   No facility-administered encounter medications on file as of 05/30/2020.    Review of Systems  Constitutional: Negative for activity change, appetite change, chills, diaphoresis, fatigue, fever and unexpected weight change.  Respiratory: Negative for cough, shortness of breath, wheezing and stridor.   Cardiovascular: Positive for leg swelling. Negative for chest pain and palpitations.  Gastrointestinal: Negative for abdominal distention, abdominal pain, constipation and diarrhea.  Genitourinary: Negative for difficulty urinating, dysuria, flank pain and urgency.  Musculoskeletal: Positive for gait problem. Negative for arthralgias, back pain, joint swelling and myalgias.  Neurological: Negative for dizziness, seizures, syncope, facial asymmetry, speech difficulty, weakness and headaches.  Hematological: Negative for adenopathy. Does not bruise/bleed easily.    Psychiatric/Behavioral: Positive for confusion. Negative for agitation and behavioral problems.    Immunization History  Administered Date(s) Administered  . Influenza, High Dose Seasonal PF 05/04/2020  . Influenza-Unspecified 04/17/2014, 05/01/2016, 04/15/2019  . Moderna SARS-COVID-2 Vaccination 07/12/2019, 08/09/2019  . Pneumococcal Conjugate-13 04/20/2014  . Pneumococcal Polysaccharide-23 07/08/2003, 03/31/2011  . Pneumococcal-Unspecified 01/26/2004  . Td 08/23/2007  . Tdap 01/07/2012  . Zoster 07/17/2005  . Zoster Recombinat (Shingrix) 10/05/2017, 12/09/2017   Pertinent  Health Maintenance Due  Topic Date Due  . INFLUENZA VACCINE  Completed  . PNA vac Low Risk Adult  Completed   Fall Risk  10/30/2019 06/23/2019 05/07/2019 05/03/2019  Falls in the past year? - 1 - -  Number falls in past yr: - 1 - -  Injury with Fall? - 1 - -  Risk for fall due to : History of fall(s);Impaired balance/gait;Impaired mobility;Mental status change History of fall(s);Impaired balance/gait;Impaired mobility History of fall(s);Impaired balance/gait;Impaired mobility;Mental status change;Medication side effect History of fall(s);Impaired balance/gait;Impaired mobility;Medication side effect;Mental status change  Follow up Falls evaluation completed;Education provided;Falls prevention discussed Falls evaluation completed Falls evaluation completed;Education provided;Falls prevention discussed Falls evaluation completed;Education provided;Falls prevention discussed  Comment - [facility] at [place]:    Vitals:   05/30/20 1642  Weight: 260 lb (117.9 kg)   Body mass index is 29.29 kg/m.  Wt Readings from Last 3 Encounters:  05/30/20 260 lb (117.9 kg)  04/19/20 260 lb 3.2 oz (118 kg)  03/26/20 260 lb 3.2 oz (118 kg)    Physical Exam Vitals and nursing note reviewed.  Constitutional:      General: He is not in acute distress.    Appearance:  He is not diaphoretic.  HENT:     Head: Normocephalic and atraumatic.  Neck:     Thyroid: No thyromegaly.     Vascular: No JVD.     Trachea: No tracheal deviation.  Cardiovascular:     Rate and Rhythm: Normal rate and regular rhythm.     Heart sounds: No murmur heard.   Pulmonary:     Effort: Pulmonary effort is normal. No respiratory distress.     Breath sounds: Normal breath sounds. No wheezing.  Abdominal:     General: Bowel sounds are normal. There is no distension (mild but difficult to tell as pt tenses up for exam).     Palpations: Abdomen is soft.     Tenderness: There is no abdominal tenderness. There is no right CVA tenderness or left CVA tenderness.  Musculoskeletal:     Right lower leg: Edema (+1) present.     Left lower leg: Edema (+1) present.  Lymphadenopathy:     Cervical: No cervical adenopathy.  Skin:  General: Skin is warm and dry.  Neurological:     General: No focal deficit present.     Mental Status: He is alert. Mental status is at baseline.  Psychiatric:        Mood and Affect: Mood normal.     Labs reviewed: Recent Labs    06/02/19 0132 07/28/19 0000 10/06/19 1010 11/14/19 0000 11/17/19 0700 11/21/19 0000 11/21/19 0600 12/08/19 0000 03/02/20 0000  NA 138   < > 137   < >  --  144  144  --  141  141 139  K 3.8   < > 4.0   < >  --  4.1  4.1  --  4.8  4.8 3.9  CL 104   < > 103   < >  --  107  107  --  105  105 105  CO2 23   < > 25   < >  --  25*  25*  --  20  20 23*  GLUCOSE 125*  --  104*  --   --   --   --   --   --   BUN 20   < > 19   < >  --  24*  24*  --  14  14 18   CREATININE 1.32*   < > 1.01   < >  --  0.8  0.8  --  0.9  0.9 0.8  CALCIUM 10.1   < > 9.6   < >   < >  --  10.5 10.6  10.6 10.0   < > = values in this interval not displayed.   No results for input(s): AST, ALT, ALKPHOS, BILITOT, PROT, ALBUMIN in the last 8760 hours. Recent Labs    06/02/19 0132 06/02/19 0132 07/28/19 0000 07/28/19 0000 10/06/19 1010  11/14/19 0000 11/17/19 0700 11/21/19 1340 03/02/20 0000 03/05/20 0000  WBC 4.1   < > 2.7   < > 2.4* 9.4   < > 2.7* 5.0 3.2  3.2  NEUTROABS 3.0  --  2  --  1.4* 8  --   --   --   --   HGB 15.0   < > 13.9   < > 13.7 13.2*   < > 13.6 15.0 14.4  14.4  HCT 46.2   < > 42   < > 43.9 39*   < > 43.6 45 43  MCV 93.9  --   --   --  94.0  --   --  93.8  --   --   PLT 78*   < > 85*   < > 109* 133*   < > 154 93* 113*  113*   < > = values in this interval not displayed.   Lab Results  Component Value Date   TSH 0.84 05/19/2019   Lab Results  Component Value Date   HGBA1C 6 08/22/2019   Lab Results  Component Value Date   CHOL 123 05/19/2019   HDL 30 (A) 05/19/2019   LDLCALC 62 05/19/2019   TRIG 154 05/19/2019    Significant Diagnostic Results in last 30 days:  No results found.  Assessment/Plan 1. Thrombocytopenia (HCC) Continue to monitor Plt count regularly (also on coumadin) He has not had an apt with Dr. Alen Singh recently but at this time his plts are stable and due to his dementia and skilled care status it seems reasonable to avoid aggressive interventions   2. Renal mass,  right Followed by urology   3. Urinary retention due to benign prostatic hyperplasia Has s/p cath due to this  4. Suprapubic catheter ([place]) Recommend flushing today due to mild distention (d  5. Longstanding persistent atrial fibrillation ([place]) His rate remains controlled without medications Continues on Coumadin, INR due in Dec Followed by Cardiology   6. Chronic diastolic heart failure (HCC) Not currently having symptoms Continue to monitor for sob, edema, and weight gain Continue compression hose  7. Other hyperlipidemia Lab Results  Component Value Date   LDLCALC 62 05/19/2019   Continue Lipitor 20 mg qd    Family/ staff Communication: discussed with his nurse   Labs/tests ordered:  NA

## 2020-06-07 DIAGNOSIS — L720 Epidermal cyst: Secondary | ICD-10-CM | POA: Diagnosis not present

## 2020-06-07 DIAGNOSIS — Z85828 Personal history of other malignant neoplasm of skin: Secondary | ICD-10-CM | POA: Diagnosis not present

## 2020-06-07 DIAGNOSIS — D0439 Carcinoma in situ of skin of other parts of face: Secondary | ICD-10-CM | POA: Diagnosis not present

## 2020-06-07 DIAGNOSIS — Z85820 Personal history of malignant melanoma of skin: Secondary | ICD-10-CM | POA: Diagnosis not present

## 2020-06-14 ENCOUNTER — Encounter: Payer: Self-pay | Admitting: Adult Health

## 2020-06-14 ENCOUNTER — Non-Acute Institutional Stay (SKILLED_NURSING_FACILITY): Payer: Medicare Other | Admitting: Adult Health

## 2020-06-14 DIAGNOSIS — I4811 Longstanding persistent atrial fibrillation: Secondary | ICD-10-CM

## 2020-06-14 DIAGNOSIS — Z7901 Long term (current) use of anticoagulants: Secondary | ICD-10-CM

## 2020-06-14 LAB — PROTIME-INR: Protime: 27.7 — AB (ref 10.0–13.8)

## 2020-06-14 LAB — POCT INR: INR: 2.5 — AB (ref 0.9–1.1)

## 2020-06-14 NOTE — Progress Notes (Signed)
Location:  Occupational psychologist of Service:  SNF (31) Provider:  Cindi Carbon, ANP [place] [phone]   Gayland Curry, DO  Patient Care Team: Gayland Curry, DO as PCP - General (Geriatric Medicine) [name], MD as PCP - Cardiology (Cardiology)  Extended Emergency Contact Information Primary Emergency Contact: Fina,[name] Address: [address]          [place], [place] [address] [name] of [place] Phone: [phone] Mobile Phone: [phone] Relation: Spouse Secondary Emergency Contact: Krieger,Corderius Address: [place] [address] [name] of [place] Phone: [phone] Mobile Phone: [phone] Relation: Son  Code Status:  DNR Goals of care: Advanced Directive information Advanced Directives 11/21/2019  Does Patient Have a Medical Advance Directive? Yes  Type of Paramedic of [place];Living will  Does patient want to make changes to medical advance directive? -  Copy of [place] in Chart? No - copy requested  Pre-existing out of facility DNR order (yellow form or pink MOST form) -     Chief Complaint  Patient presents with  . Acute Visit    Long term anticoagulation     HPI:  Pt is a 86 y.o. male seen today for an acute visit for anticoagulation. He is currently on coumadin at 5 mg qd with no reports of bleeding or bruising.  Lab Results  Component Value Date   INR 2.5 (A) 06/14/2020   INR 2.5 (A) 05/18/2020   INR 3.3 (A) 04/24/2020   PROTIME 27.7 (A) 06/14/2020   PROTIME 27.7 (A) 05/18/2020   PROTIME 28.7 (A) 04/09/2020    CHA2DS2-VASc Score = 5      Past Medical History:  Diagnosis Date  . Adult failure to thrive    Per incoming records from [facility]  . Allergic rhinitis    Per incoming records from [facility]  . Atrial fibrillation ([place])   . BPH (benign  prostatic hyperplasia)    Per incoming records from [facility]  . Cancer of kidney [facility])    Right, Per incoming records from [facility]  . Cataract    Per incoming records from [facility]  . Cerebrovascular accident, late effects    Per incoming records from [facility]  . Chronic kidney disease, stage III (moderate) ([place])    Per incoming records from [facility]  . Claudication [facility])    Per incoming records from [facility]  . Cognitive impairment    Mild, Per incoming records from [facility]  . Constipation    Per incoming records from [facility]  . Dementia ([place])   . Diarrhea    Better off Aricept, Per incoming records from [facility]  . Diastolic dysfunction    on 2D Echo 07/2009 and 2016, Per incoming records from [facility]  . Diverticulosis    Mild, Per incoming records from [facility]  . Diverticulosis of colon (without mention of hemorrhage)   . ED (erectile dysfunction)    [facility]  . History of Coumadin therapy    Per incoming records from [facility]  . History of echocardiogram 09/2014   Per incoming records from [facility]  . Hyperlipemia   . Hypertension   . Kidney mass    Per incoming  records from [facility]  . Lower leg edema    Per incoming records from [facility]  . Melanoma ([place])   . Neuropathy    Per incoming records from [facility]  . OA (osteoarthritis)    Per incoming records from [facility]  . Peripheral neuropathy    Per incoming records from [facility]  . Personal history of colonic polyps 1999 & 2004   adenomatous polyps  . Pulmonary arterial hypertension ([place])    Per incoming records from [facility]  . Rosacea    Per incoming records from [facility]  . Thrombocytopenia ([place]) 2017   Per incoming records from [facility]  . UTI (urinary tract infection)    Sepsis, Per incoming records from [facility]  . Ventricular hypertrophy   . Walker as ambulation aid    Per incoming records from [employer] [facility]   Past Surgical History:  Procedure Laterality Date  . APPENDECTOMY     Per incoming records from [facility]  . CATARACT EXTRACTION     Per incoming records from [facility]  . IR CATHETER TUBE CHANGE  11/21/2019  . KNEE SURGERY     right  . PILONIDAL CYST DRAINAGE    . POLYPECTOMY     Per incoming records from [facility]  . schwanoma tumor lumbar spine    . SPINE SURGERY     tumor removed  . THORACIC 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, Per incoming records from [facility]  . TONSILLECTOMY AND ADENOIDECTOMY      Allergies  Allergen Reactions  . Penicillins Rash    Has patient had a PCN reaction causing immediate rash, facial/tongue/throat swelling, SOB or lightheadedness with hypotension: unknown Has patient had a PCN reaction causing severe rash involving mucus membranes or skin necrosis: unknown Has patient had a PCN reaction that required hospitalization : unknown Has patient had a PCN reaction occurring within the last 10 years: no If all of the above answers are "NO", then may proceed with Cephalosporin use.     Outpatient Encounter Medications as of 06/14/2020  Medication Sig  . acetaminophen (TYLENOL) 500 MG tablet Take 500 mg by mouth 2 (two) times daily. Scheduled and every 4 hours as needed (ending 05/05/2019)Document area of discomfort and effectiveness of medication.  Marland Kitchen atorvastatin (LIPITOR) 20 MG tablet Take 20 mg by mouth every evening.   . cholecalciferol (VITAMIN D3) 25 MCG (1000 UT)  tablet Take 2,000 Units by mouth daily.   . DULoxetine (CYMBALTA) 20 MG capsule Take 20 mg by mouth daily.  . methenamine (MANDELAMINE) 1 g tablet Take 1,000 mg by mouth 2 (two) times daily.  . mineral oil-hydrophilic petrolatum (AQUAPHOR) ointment Apply topically daily. Right cheek and scalp  . polyethylene glycol (MIRALAX / GLYCOLAX) 17 g packet Take 17 g by mouth daily.  Marland Kitchen triamcinolone lotion (KENALOG) 0.1 % Apply 1 application topically 2 (two) times daily as needed.   . warfarin (COUMADIN) 5 MG tablet Take 5 mg by mouth daily.   No facility-administered encounter medications on file as of 06/14/2020.    Review of Systems  Constitutional: Negative for activity change, appetite change, chills, diaphoresis, fatigue, fever and unexpected weight change.  Cardiovascular: Positive for leg swelling. Negative for chest pain and palpitations.  Hematological: Does not bruise/bleed easily.    Immunization History  Administered Date(s) Administered  . Influenza, High  Dose Seasonal PF 05/04/2020  . Influenza-Unspecified 04/17/2014, 05/01/2016, 04/15/2019  . Moderna SARS-COVID-2 Vaccination 07/12/2019, 08/09/2019  . Pneumococcal Conjugate-13 04/20/2014  . Pneumococcal Polysaccharide-23 07/08/2003, 03/31/2011  . Pneumococcal-Unspecified 01/26/2004  . Td 08/23/2007  . Tdap 01/07/2012  . Zoster 07/17/2005  . Zoster Recombinat (Shingrix) 10/05/2017, 12/09/2017   Pertinent  Health Maintenance Due  Topic Date Due  . INFLUENZA VACCINE  Completed  . PNA vac Low Risk Adult  Completed   Fall Risk  10/30/2019 06/23/2019 05/07/2019 05/03/2019  Falls in the past year? - 1 - -  Number falls in past yr: - 1 - -  Injury with Fall? - 1 - -  Risk for fall due to : History of fall(s);Impaired balance/gait;Impaired mobility;Mental status change History of fall(s);Impaired balance/gait;Impaired mobility History of fall(s);Impaired balance/gait;Impaired mobility;Mental status change;Medication side effect  History of fall(s);Impaired balance/gait;Impaired mobility;Medication side effect;Mental status change  Follow up Falls evaluation completed;Education provided;Falls prevention discussed Falls evaluation completed Falls evaluation completed;Education provided;Falls prevention discussed Falls evaluation completed;Education provided;Falls prevention discussed  Comment - [facility] at [place]:    There were no vitals filed for this visit. There is no height or weight on file to calculate BMI. Physical Exam Vitals and nursing note reviewed.  Neurological:     Mental Status: He is alert. Mental status is at baseline.     Labs reviewed: Recent Labs    10/06/19 1010 11/14/19 0000 11/21/19 0000 11/21/19 0600 12/08/19 0000 03/02/20 0000  NA 137   < > 144  144  --  141  141 139  K 4.0   < > 4.1  4.1  --  4.8  4.8 3.9  CL 103   < > 107  107  --  105  105 105  CO2 25   < > 25*  25*  --  20  20 23*  GLUCOSE 104*  --   --   --   --   --   BUN 19   < > 24*  24*  --  14  14 18   CREATININE 1.01   < > 0.8  0.8  --  0.9  0.9 0.8  CALCIUM 9.6   < >  --  10.5 10.6  10.6 10.0   < > = values in this interval not displayed.   No results for input(s): AST, ALT, ALKPHOS, BILITOT, PROT, ALBUMIN in the last 8760 hours. Recent Labs    07/28/19 0000 07/28/19 0000 10/06/19 1010 11/14/19 0000 11/17/19 0700 11/21/19 1340 03/02/20 0000 03/05/20 0000  WBC 2.7   < > 2.4* 9.4   < > 2.7* 5.0 3.2  3.2  NEUTROABS 2  --  1.4* 8  --   --   --   --   HGB 13.9  --  13.7 13.2*   < > 13.6 15.0 14.4  14.4  HCT 42  --  43.9 39*   < > 43.6 45 43  MCV  --   --  94.0  --   --  93.8  --   --   PLT 85*  --  109* 133*   < > 154 93* 113*  113*   < > = values in this interval not displayed.   Lab Results  Component Value Date   TSH 0.84 05/19/2019   Lab Results  Component Value Date   HGBA1C 6 08/22/2019   Lab Results  Component Value  Date  CHOL 123 05/19/2019   HDL 30 (A) 05/19/2019   LDLCALC 62 05/19/2019   TRIG 154 05/19/2019    Significant Diagnostic Results in last 30 days:  No results found.  Assessment/Plan 1. Longstanding persistent atrial fibrillation (HCC) Rate is controlled without medications   2. Chronic anticoagulation INR therapeutic Continue coumadin at 5 mg qd and recheck in 1 month   Family/ staff Communication: nurse Labs/tests ordered:  INR 1 month

## 2020-07-05 ENCOUNTER — Encounter: Payer: Self-pay | Admitting: Adult Health

## 2020-07-05 ENCOUNTER — Non-Acute Institutional Stay (SKILLED_NURSING_FACILITY): Payer: Medicare Other | Admitting: Adult Health

## 2020-07-05 DIAGNOSIS — I739 Peripheral vascular disease, unspecified: Secondary | ICD-10-CM

## 2020-07-05 DIAGNOSIS — I5032 Chronic diastolic (congestive) heart failure: Secondary | ICD-10-CM

## 2020-07-05 DIAGNOSIS — D696 Thrombocytopenia, unspecified: Secondary | ICD-10-CM

## 2020-07-05 DIAGNOSIS — N401 Enlarged prostate with lower urinary tract symptoms: Secondary | ICD-10-CM

## 2020-07-05 DIAGNOSIS — Z9359 Other cystostomy status: Secondary | ICD-10-CM

## 2020-07-05 DIAGNOSIS — R338 Other retention of urine: Secondary | ICD-10-CM | POA: Diagnosis not present

## 2020-07-05 DIAGNOSIS — F0150 Vascular dementia without behavioral disturbance: Secondary | ICD-10-CM

## 2020-07-06 NOTE — Progress Notes (Signed)
Location:  Occupational psychologist of Service:  SNF (31) Provider:   Cindi Carbon, ANP [place] [phone]   Gayland Curry, DO  Patient Care Team: Gayland Curry, DO as PCP - General (Geriatric Medicine) [name], MD as PCP - Cardiology (Cardiology)  Extended Emergency Contact Information Primary Emergency Contact: Stoffel,[name] Address: [address]          [place], [place] [address] [name] of [place] Phone: [phone] Mobile Phone: [phone] Relation: Spouse Secondary Emergency Contact: Aspinwall,Yoltzin Address: [place] [address] [name] of [place] Phone: [phone] Mobile Phone: [phone] Relation: Son  Code Status:  DNR Goals of care: Advanced Directive information Advanced Directives 11/21/2019  Does Patient Have a Medical Advance Directive? Yes  Type of Paramedic of [place];Living will  Does patient want to make changes to medical advance directive? -  Copy of [place] in Chart? No - copy requested  Pre-existing out of facility DNR order (yellow form or pink MOST form) -     Chief Complaint  Patient presents with  . Medical Management of Chronic Issues    HPI:  Pt is a 86 y.o. male seen today for medical management of chronic diseases. Mr. Bahl resides in skilled care due to progressive vascular dementia.    There are no acute complaints regarding his care. In review of matrix records he has gained 7 lbs in the past month. He denies any sob, doe, cp, or palpitations. No concerns are reported from the nurse.   He continues to ambulate with a walker and participate in activities at [facility].   Has a hx of urinary retention and BPH. Now with s/p cath with no acute concerns for infection or decreased output  PVD: no leg pain or acute wounds 08/18/19: ABI Right lower ext: WNL 1.05 Left lower ext:  moderate arterial disease 0.62   Past Medical History:  Diagnosis Date  . Adult failure to thrive    Per incoming records from [facility]  . Allergic rhinitis    Per incoming records from [facility]  . Atrial fibrillation ([place])   . BPH (benign prostatic hyperplasia)    Per incoming records from [facility]  . Cancer of kidney [facility])    Right, Per incoming records from [facility]  . Cataract    Per incoming records from [facility]  . Cerebrovascular accident, late effects    Per incoming records from [facility]  . Chronic kidney disease, stage III (moderate) ([place])    Per incoming records from [facility]  . Claudication [facility])    Per incoming records from [facility]  . Cognitive impairment    Mild, Per incoming records from [facility]  . Constipation    Per incoming records from [facility]  . Dementia ([place])   . Diarrhea    Better off Aricept, Per incoming records from [facility]  . Diastolic dysfunction    on 2D Echo 07/2009 and 2016, Per incoming records from [facility]  . Diverticulosis    Mild, Per incoming records from [facility]  . Diverticulosis of colon (without mention of hemorrhage)   . ED (erectile dysfunction)    Per incoming records from [facility]  . History of Coumadin therapy  Per incoming records from [facility]  . History of echocardiogram 09/2014   Per incoming records from [facility]  . Hyperlipemia   . Hypertension   . Kidney mass    Per incoming records from [facility]  . Lower leg edema    Per incoming records from [facility]  . Melanoma (HCC)   . Neuropathy    Per incoming records from [facility]  . OA (osteoarthritis)    Per  incoming records from [facility]  . Peripheral neuropathy    Per incoming records from [facility]  . Personal history of colonic polyps 1999 & 2004   adenomatous polyps  . Pulmonary arterial hypertension (HCC)    Per incoming records from [facility]  . Rosacea    Per incoming records from [facility]  . Thrombocytopenia (HCC) 2017   Per incoming records from [facility]  . UTI (urinary tract infection)    Sepsis, Per incoming records from [facility]  . Ventricular hypertrophy   . Walker as ambulation aid    Per incoming records from [employer]   Past Surgical History:  Procedure Laterality Date  . APPENDECTOMY     Per incoming records from [facility]  . CATARACT EXTRACTION     Per incoming records from [facility]  . IR CATHETER TUBE CHANGE  11/21/2019  . KNEE SURGERY     right  . PILONIDAL CYST DRAINAGE    . POLYPECTOMY     Per incoming records from [facility]  . schwanoma tumor lumbar spine    . SPINE SURGERY     tumor removed  . THORACIC 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, Per incoming records from [facility]  . TONSILLECTOMY AND ADENOIDECTOMY      Allergies  Allergen Reactions  . Penicillins Rash    Has patient had a PCN reaction causing immediate rash, facial/tongue/throat swelling, SOB or lightheadedness with hypotension: unknown Has patient had a PCN reaction causing severe rash involving mucus membranes or skin necrosis: unknown Has patient had a PCN reaction that required hospitalization : unknown Has patient had a PCN reaction occurring within the last 10 years: no If all of the above answers are "NO", then may proceed with Cephalosporin use.     Outpatient Encounter Medications as of 07/05/2020  Medication Sig  . acetaminophen (TYLENOL) 500 MG tablet  Take 500 mg by mouth 2 (two) times daily. Scheduled and every 4 hours as needed (ending 05/05/2019)Document area of discomfort and effectiveness of medication.  Marland Kitchen atorvastatin (LIPITOR) 20 MG tablet Take 20 mg by mouth every evening.   . cholecalciferol (VITAMIN D3) 25 MCG (1000 UT) tablet Take 2,000 Units by mouth daily.   . DULoxetine (CYMBALTA) 20 MG capsule Take 20 mg by mouth daily.  . methenamine (MANDELAMINE) 1 g tablet Take 1,000 mg by mouth 2 (two) times daily.  . mineral oil-hydrophilic petrolatum (AQUAPHOR) ointment Apply topically daily. Right cheek and scalp  . polyethylene glycol (MIRALAX / GLYCOLAX) 17 g packet Take 17 g by mouth daily.  Marland Kitchen triamcinolone lotion (KENALOG) 0.1 % Apply 1 application topically 2 (two) times daily as needed.   . warfarin (COUMADIN) 5 MG tablet Take 5 mg by mouth daily.   No facility-administered encounter medications on file as of 07/05/2020.    Review of Systems  Constitutional: Negative for chills, diaphoresis and fever.  Respiratory: Negative for cough and wheezing.   Cardiovascular: Positive for leg swelling. Negative for chest pain.  Gastrointestinal: Negative for abdominal pain, constipation, diarrhea, nausea and vomiting.  Genitourinary: Negative for dysuria and urgency.  Musculoskeletal: Positive for gait problem. Negative for back pain, myalgias and neck pain.  Skin: Negative for rash.  Neurological: Negative for weakness.  Psychiatric/Behavioral: Positive for confusion. Negative for agitation and behavioral problems.    Immunization History  Administered Date(s) Administered  . Influenza, High Dose Seasonal PF 05/04/2020  . Influenza-Unspecified 04/17/2014, 05/01/2016, 04/15/2019  . Moderna Sars-Covid-2 Vaccination 07/12/2019, 08/09/2019  . Pneumococcal Conjugate-13 04/20/2014  . Pneumococcal Polysaccharide-23 07/08/2003, 03/31/2011  . Pneumococcal-Unspecified 01/26/2004  . Td 08/23/2007  . Tdap 01/07/2012  . Zoster 07/17/2005   . Zoster Recombinat (Shingrix) 10/05/2017, 12/09/2017   Pertinent  Health Maintenance Due  Topic Date Due  . INFLUENZA VACCINE  Completed  . PNA vac Low Risk Adult  Completed   Fall Risk  10/30/2019 06/23/2019 05/07/2019 05/03/2019  Falls in the past year? - 1 - -  Number falls in past yr: - 1 - -  Injury with Fall? - 1 - -  Risk for fall due to : History of fall(s);Impaired balance/gait;Impaired mobility;Mental status change History of fall(s);Impaired balance/gait;Impaired mobility History of fall(s);Impaired balance/gait;Impaired mobility;Mental status change;Medication side effect History of fall(s);Impaired balance/gait;Impaired mobility;Medication side effect;Mental status change  Follow up Falls evaluation completed;Education provided;Falls prevention discussed Falls evaluation completed Falls evaluation completed;Education provided;Falls prevention discussed Falls evaluation completed;Education provided;Falls prevention discussed  Comment - - done at [facility] all addressed at [place]:    Vitals:   07/06/20 0906  Weight: 267 lb 4.8 oz (121.2 kg)   Body mass index is 30.11 kg/m.  Wt Readings from Last 3 Encounters:  07/06/20 267 lb 4.8 oz (121.2 kg)  05/30/20 260 lb (117.9 kg)  04/19/20 260 lb 3.2 oz (118 kg)    Physical Exam Vitals and nursing note reviewed.  Constitutional:      General: He is not in acute distress.    Appearance: He is not diaphoretic.  HENT:     Head: Normocephalic and atraumatic.     Mouth/Throat:     Mouth: Mucous membranes are moist.     Pharynx: Oropharynx is clear. No oropharyngeal exudate.  Neck:     Thyroid: No thyromegaly.     Vascular: No JVD.     Trachea: No tracheal deviation.  Cardiovascular:     Rate and Rhythm: Normal rate and regular rhythm.     Heart sounds: No murmur heard.   Pulmonary:     Effort: Pulmonary effort is normal. No respiratory distress.     Breath sounds: Normal breath  sounds. No wheezing.  Abdominal:     General: Bowel sounds are normal. There is no distension.     Palpations: Abdomen is soft.     Tenderness: There is no abdominal tenderness.  Musculoskeletal:     Cervical back: Neck supple.     Comments: BLE edema +2  Lymphadenopathy:     Cervical: No cervical adenopathy.  Skin:    General: Skin is warm and dry.  Neurological:     General: No focal deficit present.     Mental Status: He is alert. Mental status is at baseline.  Psychiatric:        Mood and Affect: Mood and affect and mood normal.     Labs reviewed: Recent Labs    10/06/19 1010 11/14/19 0000 11/21/19  0000 11/21/19 0600 12/08/19 0000 03/02/20 0000  NA 137   < > 144  144  --  141  141 139  K 4.0   < > 4.1  4.1  --  4.8  4.8 3.9  CL 103   < > 107  107  --  105  105 105  CO2 25   < > 25*  25*  --  20  20 23*  GLUCOSE 104*  --   --   --   --   --   BUN 19   < > 24*  24*  --  14  14 18   CREATININE 1.01   < > 0.8  0.8  --  0.9  0.9 0.8  CALCIUM 9.6   < >  --  10.5 10.6  10.6 10.0   < > = values in this interval not displayed.   No results for input(s): AST, ALT, ALKPHOS, BILITOT, PROT, ALBUMIN in the last 8760 hours. Recent Labs    07/28/19 0000 07/28/19 0000 10/06/19 1010 11/14/19 0000 11/17/19 0700 11/21/19 1340 03/02/20 0000 03/05/20 0000  WBC 2.7   < > 2.4* 9.4   < > 2.7* 5.0 3.2  3.2  NEUTROABS 2  --  1.4* 8  --   --   --   --   HGB 13.9  --  13.7 13.2*   < > 13.6 15.0 14.4  14.4  HCT 42  --  43.9 39*   < > 43.6 45 43  MCV  --   --  94.0  --   --  93.8  --   --   PLT 85*  --  109* 133*   < > 154 93* 113*  113*   < > = values in this interval not displayed.   Lab Results  Component Value Date   TSH 0.84 05/19/2019   Lab Results  Component Value Date   HGBA1C 6 08/22/2019   Lab Results  Component Value Date   CHOL 123 05/19/2019   HDL 30 (A) 05/19/2019   LDLCALC 62 05/19/2019   TRIG 154 05/19/2019    Significant Diagnostic  Results in last 30 days:  No results found.  Assessment/Plan  1. Peripheral vascular disease ([place]) Continue statin therapy Continue to monitor for wounds, avoid tight shoes Monitor bp   2. Chronic diastolic heart failure (HCC) Has mild increase in edema. Would recommend low sodium diet and leg elevation. If worsening could give lasix but at this time he does not have any respiratory symptoms and seems to be in stable condition.   3. Vascular dementia without behavioral disturbance ([place]) MMSE 07/22/19 18/30  Moderate  Continue supportive care in the skilled environment    4. Urinary retention due to benign prostatic hyperplasia No acute issues.  Continue methenamine for UTI prevention per urology   5. Suprapubic catheter [facility]) Continue cath care maintenance provided by [facility]   6. Thrombocytopenia ([place]) Lab Results  Component Value Date   PLT 113 (A) 03/05/2020   PLT 113 (A) 03/05/2020  Unchanged. Seen by [name]. Differential includes ITP, liver disease, or myelodysplasia. He has been stable for quite some time and due to his current state of health no further workup is recommended at this time. He is on coumadin and should be monitored for excessive bleeding.     Labs/tests ordered:  NA

## 2020-07-12 DIAGNOSIS — Z7901 Long term (current) use of anticoagulants: Secondary | ICD-10-CM | POA: Diagnosis not present

## 2020-07-12 LAB — PROTIME-INR: Protime: 29.4 — AB (ref 10.0–13.8)

## 2020-07-12 LAB — POCT INR
INR: 2.6 — AB (ref 0.9–1.1)
INR: 2.6 — AB (ref 0.9–1.1)

## 2020-08-06 ENCOUNTER — Encounter: Payer: Self-pay | Admitting: Adult Health

## 2020-08-06 ENCOUNTER — Non-Acute Institutional Stay (SKILLED_NURSING_FACILITY): Payer: Medicare Other | Admitting: Adult Health

## 2020-08-06 DIAGNOSIS — R338 Other retention of urine: Secondary | ICD-10-CM | POA: Diagnosis not present

## 2020-08-06 DIAGNOSIS — L929 Granulomatous disorder of the skin and subcutaneous tissue, unspecified: Secondary | ICD-10-CM | POA: Diagnosis not present

## 2020-08-06 DIAGNOSIS — D696 Thrombocytopenia, unspecified: Secondary | ICD-10-CM | POA: Diagnosis not present

## 2020-08-06 DIAGNOSIS — I5032 Chronic diastolic (congestive) heart failure: Secondary | ICD-10-CM | POA: Diagnosis not present

## 2020-08-06 DIAGNOSIS — N401 Enlarged prostate with lower urinary tract symptoms: Secondary | ICD-10-CM | POA: Diagnosis not present

## 2020-08-06 DIAGNOSIS — L089 Local infection of the skin and subcutaneous tissue, unspecified: Secondary | ICD-10-CM

## 2020-08-06 DIAGNOSIS — I10 Essential (primary) hypertension: Secondary | ICD-10-CM | POA: Diagnosis not present

## 2020-08-07 NOTE — Progress Notes (Signed)
Location:  Occupational psychologist of Service:  SNF (31) Provider:   Cindi Carbon, ANP [place] [phone]   Gayland Curry, DO  Patient Care Team: Gayland Curry, DO as PCP - General (Geriatric Medicine) [name], MD as PCP - Cardiology (Cardiology)  Extended Emergency Contact Information Primary Emergency Contact: Worley,[name] Address: [address]          [place], [place] [address] [name] of [place] Phone: [phone] Mobile Phone: [phone] Relation: Spouse Secondary Emergency Contact: Prichard,Robbi Address: [place] [address] [name] of [place] Phone: [phone] Mobile Phone: [phone] Relation: Son  Code Status:  DNR Goals of care: Advanced Directive information Advanced Directives 11/21/2019  Does Patient Have a Medical Advance Directive? Yes  Type of Paramedic of [place];Living will  Does patient want to make changes to medical advance directive? -  Copy of [place] in Chart? No - copy requested  Pre-existing out of facility DNR order (yellow form or pink MOST form) -     Chief Complaint  Patient presents with  . Medical Management of Chronic Issues    HPI:  Pt is a 87 y.o. male seen today for medical management of chronic diseases.    Diastolic CHF: pt has gained 7 lbs in the past three months and has slightly more edema in his legs. He is wearing ill fitting ted hose. No reports of sob, pnd, doe, etc  BP is running in the 130-160 range.   Has an s/p cath due to urinary rentention associated with BPH. Denies any bladder pain or dysuria. The cath site has some hypergranulation tissue that is draining a small amt of purulent matter. He reports it is slightly tender. He is on [place]. No fever is noted  Has prior hx of thrombocytopenia of unclear etiology. Previously was followed by [name] but he  has not seem him since the pandemic. We are monitoring his labs here which are stable.  Lab Results  Component Value Date   PLT 113 (A) 03/05/2020   PLT 113 (A) 03/05/2020     Past Medical History:  Diagnosis Date  . Adult failure to thrive    Per incoming records from [facility]  . Allergic rhinitis    Per incoming records from [facility]  . Atrial fibrillation ([place])   . BPH (benign prostatic hyperplasia)    Per incoming records from [facility]  . Cancer of kidney [facility])    Right, Per incoming records from [facility]  . Cataract    Per incoming records from [facility]  . Cerebrovascular accident, late effects    Per incoming records from [facility]  . Chronic kidney disease, stage III (moderate) ([place])    Per incoming records from [facility]  . Claudication [facility])    Per incoming records from [facility]  . Cognitive impairment    Mild, Per incoming records from [facility]  . Constipation    Per incoming records from [facility]  . Dementia ([place])   . Diarrhea    Better off Aricept, Per incoming records from [facility]  . Diastolic dysfunction    on 2D Echo 07/2009 and 2016, Per incoming records from [facility]  . Diverticulosis    Mild, Per incoming records from  [employer] [facility]  . Diverticulosis of colon (without mention of hemorrhage)   . ED (erectile dysfunction)    Per incoming records from [facility]  . History of Coumadin therapy    Per incoming records from [facility]  . History of echocardiogram 09/2014   Per incoming records from [facility]  . Hyperlipemia   . Hypertension   . Kidney mass    Per incoming records from [facility]  . Lower leg edema    Per incoming records from [facility]  . Melanoma ([place])   . Neuropathy    Per incoming records from [facility]  . OA (osteoarthritis)    Per incoming records from [facility]  . Peripheral neuropathy    Per incoming records from [facility]  . Personal history of colonic polyps 1999 & 2004   adenomatous polyps  . Pulmonary arterial hypertension ([place])    Per incoming records from [facility]  . Rosacea    Per incoming records from [facility]  . Thrombocytopenia ([place]) 2017   Per incoming records from [facility]  . UTI (urinary tract infection)    Sepsis, Per incoming records from [facility]  . Ventricular hypertrophy   . Walker as ambulation aid    Per incoming records from [employer]   Past Surgical History:  Procedure Laterality Date  . APPENDECTOMY     Per incoming records from [facility]  . CATARACT EXTRACTION     Per incoming records from [facility]  . IR CATHETER TUBE CHANGE  11/21/2019  . KNEE SURGERY     right  . PILONIDAL CYST DRAINAGE    . POLYPECTOMY     Per incoming records from [facility]  . schwanoma tumor lumbar spine    . SPINE SURGERY     tumor removed  . THORACIC 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, Per incoming records from [facility]  . TONSILLECTOMY AND ADENOIDECTOMY      Allergies  Allergen Reactions  . Penicillins Rash    Has patient had a PCN reaction causing immediate rash, facial/tongue/throat swelling, SOB or lightheadedness with hypotension: unknown Has patient had a PCN reaction causing severe rash involving mucus membranes or skin necrosis: unknown Has patient had a PCN reaction that required hospitalization : unknown Has patient had a PCN reaction occurring within the last 10 years: no If all of the above answers are "NO", then may  proceed with Cephalosporin use.     Outpatient Encounter Medications as of 08/06/2020  Medication Sig  . mupirocin ointment (BACTROBAN) 2 % 1 application 2 (two) times daily.  Marland Kitchen acetaminophen (TYLENOL) 500 MG tablet Take 500 mg by mouth 2 (two) times daily. Scheduled and every 4 hours as needed (ending 05/05/2019)Document area of discomfort and effectiveness of medication.  Marland Kitchen atorvastatin (LIPITOR) 20 MG tablet Take 20 mg by mouth every evening.   . cholecalciferol (VITAMIN D3) 25 MCG (1000 UT) tablet Take 2,000 Units by mouth daily.   . DULoxetine (CYMBALTA) 20 MG capsule Take 20 mg by mouth daily.  . methenamine (MANDELAMINE) 1 g tablet Take 1,000 mg by mouth 2 (two) times daily.  . mineral oil-hydrophilic petrolatum (AQUAPHOR) ointment Apply topically daily. Right cheek and scalp  . polyethylene glycol (MIRALAX / GLYCOLAX) 17 g packet Take 17 g by mouth daily.  Marland Kitchen triamcinolone lotion (KENALOG) 0.1 %  Apply 1 application topically 2 (two) times daily as needed.   . warfarin (COUMADIN) 5 MG tablet Take 5 mg by mouth daily.   No facility-administered encounter medications on file as of 08/06/2020.    Review of Systems  Constitutional: Negative for activity change, appetite change, chills, diaphoresis, fatigue, fever and unexpected weight change.  Respiratory: Negative for cough, shortness of breath, wheezing and stridor.   Cardiovascular: Positive for leg swelling. Negative for chest pain and palpitations.  Gastrointestinal: Negative for abdominal distention, abdominal pain, constipation and diarrhea.  Genitourinary: Negative for difficulty urinating and dysuria.  Musculoskeletal: Positive for gait problem. Negative for arthralgias, back pain, joint swelling and myalgias.  Skin: Positive for color change.       Cath site with hypergranulation tissue and drainage.   Neurological: Negative for dizziness, seizures, syncope, facial asymmetry, speech difficulty, weakness and headaches.   Hematological: Negative for adenopathy. Does not bruise/bleed easily.  Psychiatric/Behavioral: Positive for confusion. Negative for agitation and behavioral problems.    Immunization History  Administered Date(s) Administered  . Influenza, High Dose Seasonal PF 05/04/2020  . Influenza-Unspecified 04/17/2014, 05/01/2016, 04/15/2019  . Moderna Sars-Covid-2 Vaccination 07/12/2019, 08/09/2019  . Pneumococcal Conjugate-13 04/20/2014  . Pneumococcal Polysaccharide-23 07/08/2003, 03/31/2011  . Pneumococcal-Unspecified 01/26/2004  . Td 08/23/2007  . Tdap 01/07/2012  . Zoster 07/17/2005  . Zoster Recombinat (Shingrix) 10/05/2017, 12/09/2017   Pertinent  Health Maintenance Due  Topic Date Due  . INFLUENZA VACCINE  Completed  . PNA vac Low Risk Adult  Completed   Fall Risk  10/30/2019 06/23/2019 05/07/2019 05/03/2019  Falls in the past year? - 1 - -  Number falls in past yr: - 1 - -  Injury with Fall? - 1 - -  Risk for fall due to : History of fall(s);Impaired balance/gait;Impaired mobility;Mental status change History of fall(s);Impaired balance/gait;Impaired mobility History of fall(s);Impaired balance/gait;Impaired mobility;Mental status change;Medication side effect History of fall(s);Impaired balance/gait;Impaired mobility;Medication side effect;Mental status change  Follow up Falls evaluation completed;Education provided;Falls prevention discussed Falls evaluation completed Falls evaluation completed;Education provided;Falls prevention discussed Falls evaluation completed;Education provided;Falls prevention discussed  Comment - - [facility] at [facility] all [facility] at [facility]   Functional Status Survey:    Vitals:   08/07/20 0934  Weight: 267 lb 14.4 oz (121.5 kg)   Body mass index is 30.18 kg/m.  Wt Readings from Last 3 Encounters:  08/07/20 267 lb 14.4 oz (121.5 kg)  07/06/20 267 lb 4.8 oz (121.2 kg)  05/30/20 260 lb (117.9 kg)   Physical Exam Vitals and nursing note  reviewed.  Constitutional:      General: He is not in acute distress.    Appearance: He is not diaphoretic.  HENT:     Head: Normocephalic and atraumatic.  Neck:     Thyroid: No thyromegaly.     Vascular: No JVD.     Trachea: No tracheal deviation.  Cardiovascular:     Rate and Rhythm: Normal rate. Rhythm irregular.     Heart sounds: No murmur heard.   Pulmonary:     Effort: Pulmonary effort is normal. No respiratory distress.     Breath sounds: Normal breath sounds. No wheezing.  Abdominal:     General: Bowel sounds are normal. There is no distension.     Palpations: Abdomen is soft.     Tenderness: There is no abdominal tenderness.  Musculoskeletal:     Right lower leg: Edema (+2) present.     Left lower leg: Edema (+2) present.  Lymphadenopathy:  Cervical: No cervical adenopathy.  Skin:    General: Skin is warm and dry.     Findings: Erythema (and mild purulent drainage at s/p site. ) present.     Comments: S/p site with hypergranulation tissue 100% pink  Neurological:     General: No focal deficit present.     Mental Status: He is alert. Mental status is at baseline.  Psychiatric:        Mood and Affect: Mood and affect and mood normal.     Labs reviewed: Recent Labs    10/06/19 1010 11/14/19 0000 11/21/19 0000 11/21/19 0600 12/08/19 0000 03/02/20 0000  NA 137   < > 144  144  --  141  141 139  K 4.0   < > 4.1  4.1  --  4.8  4.8 3.9  CL 103   < > 107  107  --  105  105 105  CO2 25   < > 25*  25*  --  20  20 23*  GLUCOSE 104*  --   --   --   --   --   BUN 19   < > 24*  24*  --  14  14 18   CREATININE 1.01   < > 0.8  0.8  --  0.9  0.9 0.8  CALCIUM 9.6   < >  --  10.5 10.6  10.6 10.0   < > = values in this interval not displayed.   No results for input(s): AST, ALT, ALKPHOS, BILITOT, PROT, ALBUMIN in the last 8760 hours. Recent Labs    10/06/19 1010 11/14/19 0000 11/17/19 0700 11/21/19 1340 03/02/20 0000 03/05/20 0000  WBC 2.4* 9.4   <  > 2.7* 5.0 3.2  3.2  NEUTROABS 1.4* 8  --   --   --   --   HGB 13.7 13.2*   < > 13.6 15.0 14.4  14.4  HCT 43.9 39*   < > 43.6 45 43  MCV 94.0  --   --  93.8  --   --   PLT 109* 133*   < > 154 93* 113*  113*   < > = values in this interval not displayed.   Lab Results  Component Value Date   TSH 0.84 05/19/2019   Lab Results  Component Value Date   HGBA1C 6 08/22/2019   Lab Results  Component Value Date   CHOL 123 05/19/2019   HDL 30 (A) 05/19/2019   LDLCALC 62 05/19/2019   TRIG 154 05/19/2019    Significant Diagnostic Results in last 30 days:  No results found.  Assessment/Plan 1. Chronic diastolic heart failure (HCC) Apply 20-30 mmHg compression hose and keep legs elevated No current resp complaints.  Not on HCTZ due to prior issues with sodium See below.   2. Essential hypertension BP slightly above goal but pt is a fall risk. Will continue to monitor and if consistently elevated may add low dose lasix.   3. Urinary retention due to benign prostatic hyperplasia S/p cath draining well with no acute concerns   4. Thrombocytopenia (HCC) Differential includes MDS, needs f/u CBC  5. Local skin infection Continue bactroban bid to s/p site to complete 7 day course   6. Hypergranulation Around the s/p site Consider silver nitrate stick treatment. Will discuss with wound care nurse.     Family/ staff Communication: nurse   Labs/tests ordered:  CBC BMP Lipid panel with next INR

## 2020-08-09 ENCOUNTER — Encounter: Payer: Self-pay | Admitting: Adult Health

## 2020-08-09 ENCOUNTER — Encounter: Payer: Self-pay | Admitting: Internal Medicine

## 2020-08-09 ENCOUNTER — Non-Acute Institutional Stay (SKILLED_NURSING_FACILITY): Payer: Medicare Other | Admitting: Adult Health

## 2020-08-09 DIAGNOSIS — D6949 Other primary thrombocytopenia: Secondary | ICD-10-CM | POA: Diagnosis not present

## 2020-08-09 DIAGNOSIS — L929 Granulomatous disorder of the skin and subcutaneous tissue, unspecified: Secondary | ICD-10-CM | POA: Diagnosis not present

## 2020-08-09 DIAGNOSIS — Z7901 Long term (current) use of anticoagulants: Secondary | ICD-10-CM

## 2020-08-09 DIAGNOSIS — L089 Local infection of the skin and subcutaneous tissue, unspecified: Secondary | ICD-10-CM | POA: Diagnosis not present

## 2020-08-09 DIAGNOSIS — I4811 Longstanding persistent atrial fibrillation: Secondary | ICD-10-CM | POA: Diagnosis not present

## 2020-08-09 DIAGNOSIS — I482 Chronic atrial fibrillation, unspecified: Secondary | ICD-10-CM | POA: Diagnosis not present

## 2020-08-09 DIAGNOSIS — E785 Hyperlipidemia, unspecified: Secondary | ICD-10-CM | POA: Diagnosis not present

## 2020-08-09 LAB — LIPID PANEL
Cholesterol: 129 (ref 0–200)
HDL: 32 — AB (ref 35–70)
LDL Cholesterol: 80
Triglycerides: 86 (ref 40–160)

## 2020-08-09 LAB — CBC: RBC: 4.83 (ref 3.87–5.11)

## 2020-08-09 LAB — CBC AND DIFFERENTIAL
HCT: 42 (ref 41–53)
Hemoglobin: 14.2 (ref 13.5–17.5)
Platelets: 80 — AB (ref 150–399)
WBC: 3.4

## 2020-08-09 LAB — POCT INR
INR: 2.7 — AB (ref 0.9–1.1)
INR: 2.7 — AB (ref 0.9–1.1)

## 2020-08-09 LAB — PROTIME-INR: Protime: 26.2 — AB (ref 10.0–13.8)

## 2020-08-09 NOTE — Progress Notes (Signed)
Location:  Occupational psychologist of Service:  SNF (31) Provider:   Cindi Carbon, ANP [place] [phone]   Gayland Curry, DO  Patient Care Team: Gayland Curry, DO as PCP - General (Geriatric Medicine) [name], MD as PCP - Cardiology (Cardiology)  Extended Emergency Contact Information Primary Emergency Contact: Lucken,[name] Address: [address]          [place], [place] [address] [name] of [place] Phone: [phone] Mobile Phone: [phone] Relation: Spouse Secondary Emergency Contact: Wardell,Felix Address: [place] [address] [name] of [place] Phone: [phone] Mobile Phone: [phone] Relation: Son  Code Status:  DNR Goals of care: Advanced Directive information Advanced Directives 11/21/2019  Does Patient Have a Medical Advance Directive? Yes  Type of Paramedic of [place];Living will  Does patient want to make changes to medical advance directive? -  Copy of [place] in Chart? No - copy requested  Pre-existing out of facility DNR order (yellow form or pink MOST form) -     Chief Complaint  Patient presents with  . Acute Visit    S/p site looking worse, confusion coumadin management     HPI:  Pt is a 87 y.o. male seen today for an acute visit for s/p site looking worse, confusion, and coumadin management.   S/p site is currently being treated with bactroban and has more redness and purulent drainage.   His wife feels he is more confused. He has a baseline dementia but she noticed a subtle change. The staff has not noticed a change from his baseline but over time he is certainly progressing in his dementia.   He has not had any bladder pain or fever. Did report some s/p site discomfor  Currently on 5 mg of coumadin for CVA risk reduction. CHA2DS2-VASc Score = 5  Lab Results  Component Value Date    INR 2.7 (A) 08/09/2020   INR 2.6 (A) 07/12/2020   INR 2.5 (A) 06/14/2020   PROTIME 26.2 (A) 08/09/2020   PROTIME 29.4 (A) 07/12/2020   PROTIME 27.7 (A) 06/14/2020      Past Medical History:  Diagnosis Date  . Adult failure to thrive    Per incoming records from [facility]  . Allergic rhinitis    Per incoming records from [facility]  . Atrial fibrillation ([place])   . BPH (benign prostatic hyperplasia)    Per incoming records from [facility]  . Cancer of kidney [facility])    Right, Per incoming records from [facility]  . Cataract    Per incoming records from [facility]  . Cerebrovascular accident, late effects    Per incoming records from [facility]  . Chronic kidney disease, stage III (moderate) ([place])    Per incoming records from [facility]  . Claudication [facility])    Per incoming records from [facility]  . Cognitive impairment    Mild, Per incoming records from [facility]  . Constipation    Per incoming records from [facility]  . Dementia ([place])   . Diarrhea    Better off Aricept, Per incoming records from [facility]  . Diastolic dysfunction    on 2D Echo 07/2009 and 2016, Per incoming records from [facility]  . Diverticulosis    Mild, Per incoming  records from [facility]  . Diverticulosis of colon (without mention of hemorrhage)   . ED (erectile dysfunction)    Per incoming records from [facility]  . History of Coumadin therapy    Per incoming records from [facility]  . History of echocardiogram 09/2014   Per incoming records from [facility]  . Hyperlipemia   . Hypertension   . Kidney mass    Per incoming records from [facility]  . Lower leg edema    Per incoming records from [facility]  . Melanoma ([place])   . Neuropathy    Per incoming records from [facility]  . OA (osteoarthritis)    Per incoming records from [facility]  . Peripheral neuropathy    Per incoming records from [facility]  . Personal history of colonic polyps 1999 & 2004   adenomatous polyps  . Pulmonary arterial hypertension ([place])    Per incoming records from [facility]  . Rosacea    Per incoming records from [facility]  . Thrombocytopenia ([place]) 2017   Per incoming records from [facility]  . UTI (urinary tract infection)    Sepsis, Per incoming records from [facility]  . Ventricular hypertrophy   . Walker as ambulation aid    Per incoming records from [employer]   Past Surgical History:  Procedure Laterality Date  . APPENDECTOMY     Per incoming records from [facility]  . CATARACT EXTRACTION     Per incoming records from [facility]  . IR CATHETER TUBE CHANGE  11/21/2019  . KNEE SURGERY     right  . PILONIDAL CYST DRAINAGE    . POLYPECTOMY     Per incoming records from [facility]  . schwanoma tumor lumbar spine    . SPINE SURGERY     tumor removed  . THORACIC 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, Per incoming records from [facility]  . TONSILLECTOMY AND ADENOIDECTOMY      Allergies  Allergen Reactions  . Penicillins Rash    Has patient had a PCN reaction causing immediate rash, facial/tongue/throat swelling, SOB or lightheadedness with hypotension: unknown Has patient had a PCN reaction causing severe rash involving mucus membranes or skin necrosis: unknown Has patient had a PCN reaction that required hospitalization : unknown Has patient had a PCN reaction occurring within the last 10 years: no If all of the above answers are "NO", then may proceed with  Cephalosporin use.     Outpatient Encounter Medications as of 08/09/2020  Medication Sig  . acetaminophen (TYLENOL) 500 MG tablet Take 500 mg by mouth 2 (two) times daily. Scheduled and every 4 hours as needed (ending 05/05/2019)Document area of discomfort and effectiveness of medication.  Marland Kitchen atorvastatin (LIPITOR) 20 MG tablet Take 20 mg by mouth every evening.   . cholecalciferol (VITAMIN D3) 25 MCG (1000 UT) tablet Take 2,000 Units by mouth daily.   . DULoxetine (CYMBALTA) 20 MG capsule Take 20 mg by mouth daily.  . methenamine (MANDELAMINE) 1 g tablet Take 1,000 mg by mouth 2 (two) times daily.  . mineral oil-hydrophilic petrolatum (AQUAPHOR) ointment Apply topically daily. Right cheek and scalp  . polyethylene glycol (MIRALAX / GLYCOLAX) 17 g packet Take 17 g by mouth daily.  Marland Kitchen triamcinolone lotion (KENALOG) 0.1 % Apply 1 application topically 2 (two) times daily as needed.   Marland Kitchen  warfarin (COUMADIN) 5 MG tablet Take 5 mg by mouth daily.   No facility-administered encounter medications on file as of 08/09/2020.    Review of Systems  Constitutional: Negative for activity change, appetite change, chills, diaphoresis, fatigue, fever and unexpected weight change.  Respiratory: Negative for cough, shortness of breath, wheezing and stridor.   Cardiovascular: Positive for leg swelling. Negative for chest pain and palpitations.  Gastrointestinal: Negative for abdominal distention, abdominal pain, constipation and diarrhea.  Genitourinary: Negative for decreased urine volume, difficulty urinating, dysuria, flank pain, frequency, penile discharge, penile pain, penile swelling and urgency.  Musculoskeletal: Positive for gait problem. Negative for arthralgias, back pain, joint swelling and myalgias.  Skin: Positive for color change, rash and wound.  Neurological: Negative for dizziness, seizures, syncope, facial asymmetry, speech difficulty, weakness and headaches.  Hematological: Negative for  adenopathy. Does not bruise/bleed easily.  Psychiatric/Behavioral: Positive for confusion. Negative for agitation and behavioral problems.    Immunization History  Administered Date(s) Administered  . Influenza, High Dose Seasonal PF 05/04/2020  . Influenza-Unspecified 04/17/2014, 05/01/2016, 04/15/2019  . Moderna Sars-Covid-2 Vaccination 07/12/2019, 08/09/2019  . Pneumococcal Conjugate-13 04/20/2014  . Pneumococcal Polysaccharide-23 07/08/2003, 03/31/2011  . Pneumococcal-Unspecified 01/26/2004  . Td 08/23/2007  . Tdap 01/07/2012  . Zoster 07/17/2005  . Zoster Recombinat (Shingrix) 10/05/2017, 12/09/2017   Pertinent  Health Maintenance Due  Topic Date Due  . INFLUENZA VACCINE  Completed  . PNA vac Low Risk Adult  Completed   Fall Risk  10/30/2019 06/23/2019 05/07/2019 05/03/2019  Falls in the past year? - 1 - -  Number falls in past yr: - 1 - -  Injury with Fall? - 1 - -  Risk for fall due to : History of fall(s);Impaired balance/gait;Impaired mobility;Mental status change History of fall(s);Impaired balance/gait;Impaired mobility History of fall(s);Impaired balance/gait;Impaired mobility;Mental status change;Medication side effect History of fall(s);Impaired balance/gait;Impaired mobility;Medication side effect;Mental status change  Follow up Falls evaluation completed;Education provided;Falls prevention discussed Falls evaluation completed Falls evaluation completed;Education provided;Falls prevention discussed Falls evaluation completed;Education provided;Falls prevention discussed  Comment - - [facility] all addressed at [place]:    There were no vitals filed for this visit. There is no height or weight on file to calculate BMI. Physical Exam Vitals and nursing note reviewed.  Constitutional:      General: He is not in acute distress.    Appearance: He is not diaphoretic.  HENT:     Head: Normocephalic and atraumatic.  Neck:      Thyroid: No thyromegaly.     Vascular: No JVD.     Trachea: No tracheal deviation.  Cardiovascular:     Rate and Rhythm: Normal rate. Rhythm irregular.     Heart sounds: No murmur heard.   Pulmonary:     Effort: Pulmonary effort is normal. No respiratory distress.     Breath sounds: Normal breath sounds. No wheezing.  Abdominal:     General: Bowel sounds are normal. There is no distension.     Palpations: Abdomen is soft.     Tenderness: There is no abdominal tenderness.  Genitourinary:    Comments: Dark yellow urine in s/p cath. Not purulent. Hypergranulation tissue noted at the surrounding site.  Musculoskeletal:     Cervical back: Normal range of motion and neck supple.     Right lower leg: Edema (+1) present.     Left lower leg: Edema (+1) present.  Lymphadenopathy:     Cervical: No cervical adenopathy.  Skin:  General: Skin is warm and dry.     Findings: Erythema (and purulent drainage at the s/p site. ) present.  Neurological:     Mental Status: He is alert and oriented to person, place, and time.     Cranial Nerves: No cranial nerve deficit.  Psychiatric:        Mood and Affect: Mood and affect normal.     Labs reviewed: Recent Labs    10/06/19 1010 11/14/19 0000 11/21/19 0000 11/21/19 0600 12/08/19 0000 03/02/20 0000  NA 137   < > 144  144  --  141  141 139  K 4.0   < > 4.1  4.1  --  4.8  4.8 3.9  CL 103   < > 107  107  --  105  105 105  CO2 25   < > 25*  25*  --  20  20 23*  GLUCOSE 104*  --   --   --   --   --   BUN 19   < > 24*  24*  --  14  14 18   CREATININE 1.01   < > 0.8  0.8  --  0.9  0.9 0.8  CALCIUM 9.6   < >  --  10.5 10.6  10.6 10.0   < > = values in this interval not displayed.   No results for input(s): AST, ALT, ALKPHOS, BILITOT, PROT, ALBUMIN in the last 8760 hours. Recent Labs    10/06/19 1010 11/14/19 0000 11/17/19 0700 11/21/19 1340 03/02/20 0000 03/05/20 0000  WBC 2.4* 9.4   < > 2.7* 5.0 3.2  3.2  NEUTROABS 1.4*  8  --   --   --   --   HGB 13.7 13.2*   < > 13.6 15.0 14.4  14.4  HCT 43.9 39*   < > 43.6 45 43  MCV 94.0  --   --  93.8  --   --   PLT 109* 133*   < > 154 93* 113*  113*   < > = values in this interval not displayed.   Lab Results  Component Value Date   TSH 0.84 05/19/2019   Lab Results  Component Value Date   HGBA1C 6 08/22/2019   Lab Results  Component Value Date   CHOL 123 05/19/2019   HDL 30 (A) 05/19/2019   LDLCALC 62 05/19/2019   TRIG 154 05/19/2019    Significant Diagnostic Results in last 30 days:  No results found.  Assessment/Plan 1. Skin infection The surrounding s/p site is looking worse with more redness and hypergranulation tissue despite bactroban  Doxycycline 100 mg bid x 7 days Bladder scan after draining s/p site to ensure proper emptying.   2. Hypergranulation Silver nitrate to hypergranulation tissue at s/p site three times weekly until resolved per wound care   3. Chronic anticoagulation Continue current dose of coumadin. INR in the am due to antibiotic use  4. Longstanding persistent atrial fibrillation (HCC) Rate is controlled without medications.    I did not see any confusion during the visit and he has no current symptoms of a UTI. We are starting an antibiotic for the skin infection and we will continue to monitor his symptoms   Family/ staff Communication: left message for is wife Clem   Labs/tests ordered:  INR in am

## 2020-08-10 DIAGNOSIS — Z7901 Long term (current) use of anticoagulants: Secondary | ICD-10-CM | POA: Diagnosis not present

## 2020-08-13 DIAGNOSIS — Z7901 Long term (current) use of anticoagulants: Secondary | ICD-10-CM | POA: Diagnosis not present

## 2020-08-14 ENCOUNTER — Telehealth: Payer: Self-pay | Admitting: Family

## 2020-08-14 DIAGNOSIS — I482 Chronic atrial fibrillation, unspecified: Secondary | ICD-10-CM | POA: Diagnosis not present

## 2020-08-14 LAB — POCT INR: INR: 3.3 — AB (ref 0.9–1.1)

## 2020-08-14 NOTE — Telephone Encounter (Signed)
On call late entry 08/13/2020:  Received call from facility Nurse reports patient's INR 3.9 goal 2-3.0 for Afib.Advised to hold coumadin x 1 dose tonight and recheck INR 08/14/2020.

## 2020-08-16 DIAGNOSIS — Z7901 Long term (current) use of anticoagulants: Secondary | ICD-10-CM | POA: Diagnosis not present

## 2020-08-16 LAB — POCT INR
INR: 2.6 — AB (ref 0.9–1.1)
INR: 2.6 — AB (ref 0.9–1.1)

## 2020-08-23 ENCOUNTER — Encounter: Payer: Self-pay | Admitting: Adult Health

## 2020-08-23 ENCOUNTER — Non-Acute Institutional Stay (SKILLED_NURSING_FACILITY): Payer: Medicare Other | Admitting: Adult Health

## 2020-08-23 DIAGNOSIS — Z7901 Long term (current) use of anticoagulants: Secondary | ICD-10-CM

## 2020-08-23 DIAGNOSIS — I4811 Longstanding persistent atrial fibrillation: Secondary | ICD-10-CM | POA: Diagnosis not present

## 2020-08-23 LAB — PROTIME-INR: Protime: 25.4 — AB (ref 10.0–13.8)

## 2020-08-23 LAB — POCT INR: INR: 2.6 — AB (ref 0.9–1.1)

## 2020-08-23 NOTE — Progress Notes (Signed)
Location:  [place] Room Number: 142-A Place of Service:  SNF [phone]) Provider:  Cindi Carbon, NP  Patient Care Team: Gayland Curry, DO as PCP - General (Geriatric Medicine) [name], MD as PCP - Cardiology (Cardiology)  Extended Emergency Contact Information Primary Emergency Contact: Silba,[name] Address: [address]          [place], [place] [address] [name] of [place] Phone: [phone] Mobile Phone: [phone] Relation: Spouse Secondary Emergency Contact: Schwake,Duncan Address: [place] [address] [name] of [place] Phone: [phone] Mobile Phone: [phone] Relation: Son  Code Status:  DNR  Goals of care: Advanced Directive information Advanced Directives 08/23/2020  Does Patient Have a Medical Advance Directive? Yes  Type of Advance Directive Living will;Out of facility DNR (pink MOST or yellow form)  Does patient want to make changes to medical advance directive? No - Patient declined  Copy of [place] in Chart? -  Pre-existing out of facility DNR order (yellow form or pink MOST form) Yellow form placed in chart (order not valid for inpatient use);Pink MOST form placed in chart (order not valid for inpatient use)     Chief Complaint  Patient presents with  . Anticoagulation    Coumadin management     HPI:  Pt is a 87 y.o. male seen today for an acute visit for coumadin management.   INR 2.5   No acute bleeding or bruising episodes  Currently on 5 mg of coumadin for CVA risk reduction  CHA2DS2-VASc Score = 5    Past Medical History:  Diagnosis Date  . Adult failure to thrive    Per incoming records from [facility]  . Allergic rhinitis    Per incoming records from [facility]  . Atrial fibrillation ([place])   . BPH (benign prostatic hyperplasia)    Per incoming records from [facility]  . Cancer of kidney [facility])    Right, Per incoming records from [facility]  . Cataract    Per incoming records from [facility]  . Cerebrovascular accident, late effects    Per incoming records from [facility]  . Chronic kidney disease, stage III (moderate) ([place])    Per incoming records from [facility]  . Claudication [facility])    Per incoming records from [facility]  . Cognitive impairment    Mild, Per incoming records from [facility]  . Constipation    Per incoming records from [facility]  . Dementia ([place])   . Diarrhea    Better off Aricept, Per incoming records from [facility]  . Diastolic dysfunction    on 2D Echo 07/2009 and 2016, Per incoming records from [facility]  . Diverticulosis    Mild, Per incoming records from [facility]  . Diverticulosis of colon (without mention of hemorrhage)   . ED (erectile dysfunction)    Per incoming records from [facility]  . History of Coumadin therapy    Per incoming records from [facility]  . History of echocardiogram 09/2014   Per incoming records from [facility]  . Hyperlipemia   . Hypertension   . Kidney mass    Per incoming records from [facility]  . Lower leg edema    Per incoming records from [facility]  .  Melanoma ([place])   . Neuropathy    [facility]  . OA (osteoarthritis)    Per incoming records from [facility]  . Peripheral neuropathy    Per incoming records from [facility]  . Personal history of colonic polyps 1999 & 2004   adenomatous polyps  . Pulmonary arterial hypertension ([place])    Per incoming records from [facility]  . Rosacea    Per incoming records from [facility]  . Thrombocytopenia ([place]) 2017   Per incoming records from [facility]  . UTI (urinary tract infection)    Sepsis, Per incoming records from [facility]  . Ventricular hypertrophy   . Walker as ambulation aid    Per incoming records from [employer]   Past Surgical History:  Procedure Laterality Date  . APPENDECTOMY     Per [facility]  . CATARACT EXTRACTION     Per incoming records from [facility]  . IR CATHETER TUBE CHANGE  11/21/2019  . KNEE SURGERY     right  . PILONIDAL CYST DRAINAGE    . POLYPECTOMY     Per incoming records from [facility]  . schwanoma tumor lumbar spine    . SPINE SURGERY     tumor removed  . THORACIC 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, Per incoming records from [facility]  . TONSILLECTOMY AND ADENOIDECTOMY      Allergies  Allergen Reactions  . Penicillins Rash    Has patient had a PCN reaction causing immediate rash, facial/tongue/throat swelling, SOB or lightheadedness with hypotension: unknown Has patient had a PCN reaction causing severe rash involving mucus membranes or skin necrosis: unknown Has patient had a PCN reaction that required hospitalization : unknown Has patient had a PCN reaction occurring within the last 10 years: no If all of the above answers are "NO", then may proceed with Cephalosporin use.     Outpatient Encounter Medications as of 08/23/2020  Medication Sig  . acetaminophen (TYLENOL) 500 MG tablet Take 500 mg by mouth 2 (two) times daily. Scheduled and every 4 hours as needed (ending 05/05/2019)Document area of discomfort and effectiveness of medication.  Marland Kitchen atorvastatin (LIPITOR) 20 MG tablet Take 20 mg by mouth every evening.  . cholecalciferol (VITAMIN D3) 25 MCG (1000 UT) tablet Take 2,000 Units by mouth daily.   . DULoxetine (CYMBALTA) 20 MG  capsule Take 20 mg by mouth daily.  . methenamine (MANDELAMINE) 1 g tablet Take 1,000 mg by mouth 2 (two) times daily.  . mineral oil-hydrophilic petrolatum (AQUAPHOR) ointment Apply topically daily. Right cheek and scalp  . polyethylene glycol (MIRALAX / GLYCOLAX) 17 g packet Take 17 g by mouth daily.  . silver nitrate applicators 08-65 % applicator Apply 1 application topically 3 (three) times a week. Monday, Wednesday, and Friday for wound care until resolved  . triamcinolone lotion (KENALOG) 0.1 % Apply 1 application topically 2 (two) times daily as needed.   . warfarin (COUMADIN) 5 MG tablet Take 5 mg by mouth daily.   No facility-administered encounter medications on file as of 08/23/2020.    Review of Systems  Constitutional: Negative for activity change, appetite change, chills, diaphoresis, fatigue, fever and unexpected weight change.  Hematological: Negative for adenopathy. Does not bruise/bleed easily.    Immunization History  Administered Date(s) Administered  . Influenza, High Dose Seasonal PF 05/04/2020  .  Influenza-Unspecified 04/17/2014, 05/01/2016, 04/15/2019  . Moderna SARS-COV2 Booster Vaccination 05/17/2020  . Moderna Sars-Covid-2 Vaccination 07/12/2019, 08/09/2019  . Pneumococcal Conjugate-13 04/20/2014  . Pneumococcal Polysaccharide-23 07/08/2003, 03/31/2011  . Pneumococcal-Unspecified 01/26/2004  . Td 08/23/2007  . Tdap 01/07/2012  . Zoster 07/17/2005  . Zoster Recombinat (Shingrix) 10/05/2017, 12/09/2017   Pertinent  Health Maintenance Due  Topic Date Due  . INFLUENZA VACCINE  Completed  . PNA vac Low Risk Adult  Completed   Fall Risk  10/30/2019 06/23/2019 05/07/2019 05/03/2019  Falls in the past year? - 1 - -  Number falls in past yr: - 1 - -  Injury with Fall? - 1 - -  Risk for fall due to : History of fall(s);Impaired balance/gait;Impaired mobility;Mental status change History of fall(s);Impaired balance/gait;Impaired mobility History of  fall(s);Impaired balance/gait;Impaired mobility;Mental status change;Medication side effect History of fall(s);Impaired balance/gait;Impaired mobility;Medication side effect;Mental status change  Follow up Falls evaluation completed;Education provided;Falls prevention discussed Falls evaluation completed Falls evaluation completed;Education provided;Falls prevention discussed Falls evaluation completed;Education provided;Falls prevention discussed  Comment - - [facility] all addressed at [place]:    Vitals:   08/23/20 1049  BP: (!) 156/82  Pulse: 67  Resp: 19  Temp: (!) 97.3 F (36.3 C)  SpO2: 96%  Weight: 267 lb 4.8 oz (121.2 kg)  Height: 6\' 7"  (2.007 m)   Body mass index is 30.11 kg/m. Physical Exam Constitutional:      Appearance: Normal appearance.  Neurological:     General: No focal deficit present.     Mental Status: He is alert. Mental status is at baseline.     Labs reviewed: Recent Labs    10/06/19 1010 11/14/19 0000 11/21/19 0000 11/21/19 0600 12/08/19 0000 03/02/20 0000  NA 137   < > 144  144  --  141  141 139  K 4.0   < > 4.1  4.1  --  4.8  4.8 3.9  CL 103   < > 107  107  --  105  105 105  CO2 25   < > 25*  25*  --  20  20 23*  GLUCOSE 104*  --   --   --   --   --   BUN 19   < > 24*  24*  --  14  14 18   CREATININE 1.01   < > 0.8  0.8  --  0.9  0.9 0.8  CALCIUM 9.6   < >  --  10.5 10.6  10.6 10.0   < > = values in this interval not displayed.   No results for input(s): AST, ALT, ALKPHOS, BILITOT, PROT, ALBUMIN in the last 8760 hours. Recent Labs    10/06/19 1010 11/14/19 0000 11/17/19 0700 11/21/19 1340 03/02/20 0000 03/05/20 0000 08/09/20 0000  WBC 2.4* 9.4   < > 2.7* 5.0 3.2  3.2 3.4  NEUTROABS 1.4* 8  --   --   --   --   --   HGB 13.7 13.2*   < > 13.6 15.0 14.4  14.4 14.2  HCT 43.9 39*   < > 43.6 45 43 42  MCV 94.0  --   --  93.8  --   --   --   PLT 109* 133*   < > 154 93* 113*  113*  80*   < > = values in this interval not displayed.   Lab Results  Component Value Date   TSH 0.84  05/19/2019   Lab Results  Component Value Date   HGBA1C 6 08/22/2019   Lab Results  Component Value Date   CHOL 129 08/09/2020   HDL 32 (A) 08/09/2020   LDLCALC 80 08/09/2020   TRIG 86 08/09/2020    Significant Diagnostic Results in last 30 days:  No results found.  Assessment/Plan  1. Longstanding persistent atrial fibrillation (HCC) Rate is controlled without meds  2. Chronic anticoagulation Continue Coumadin 5 mg qd for CVA risk reduction Monitor closely due to hx of thrombocytopenia Due to prior variability of INR will recheck in 1 week    Family/ staff Communication: discussed with nurse  Labs/tests ordered:  INR in 1 week

## 2020-08-27 ENCOUNTER — Encounter: Payer: Self-pay | Admitting: Internal Medicine

## 2020-08-29 ENCOUNTER — Encounter: Payer: Self-pay | Admitting: *Deleted

## 2020-08-30 DIAGNOSIS — Z7901 Long term (current) use of anticoagulants: Secondary | ICD-10-CM | POA: Diagnosis not present

## 2020-08-30 LAB — PROTIME-INR: Protime: 24.6 — AB (ref 10.0–13.8)

## 2020-08-30 LAB — POCT INR: INR: 2.5 — AB (ref 0.9–1.1)

## 2020-09-05 ENCOUNTER — Encounter: Payer: Self-pay | Admitting: *Deleted

## 2020-09-10 ENCOUNTER — Non-Acute Institutional Stay (SKILLED_NURSING_FACILITY): Payer: Medicare Other | Admitting: Adult Health

## 2020-09-10 DIAGNOSIS — I4811 Longstanding persistent atrial fibrillation: Secondary | ICD-10-CM | POA: Diagnosis not present

## 2020-09-10 DIAGNOSIS — N401 Enlarged prostate with lower urinary tract symptoms: Secondary | ICD-10-CM

## 2020-09-10 DIAGNOSIS — Z9359 Other cystostomy status: Secondary | ICD-10-CM | POA: Diagnosis not present

## 2020-09-10 DIAGNOSIS — F0150 Vascular dementia without behavioral disturbance: Secondary | ICD-10-CM | POA: Diagnosis not present

## 2020-09-10 DIAGNOSIS — N2889 Other specified disorders of kidney and ureter: Secondary | ICD-10-CM

## 2020-09-10 DIAGNOSIS — D709 Neutropenia, unspecified: Secondary | ICD-10-CM | POA: Diagnosis not present

## 2020-09-10 DIAGNOSIS — G609 Hereditary and idiopathic neuropathy, unspecified: Secondary | ICD-10-CM

## 2020-09-10 DIAGNOSIS — I5032 Chronic diastolic (congestive) heart failure: Secondary | ICD-10-CM

## 2020-09-10 DIAGNOSIS — Z7901 Long term (current) use of anticoagulants: Secondary | ICD-10-CM

## 2020-09-10 DIAGNOSIS — D696 Thrombocytopenia, unspecified: Secondary | ICD-10-CM | POA: Diagnosis not present

## 2020-09-10 DIAGNOSIS — I739 Peripheral vascular disease, unspecified: Secondary | ICD-10-CM | POA: Diagnosis not present

## 2020-09-10 DIAGNOSIS — K5901 Slow transit constipation: Secondary | ICD-10-CM

## 2020-09-10 DIAGNOSIS — R338 Other retention of urine: Secondary | ICD-10-CM

## 2020-09-12 ENCOUNTER — Encounter: Payer: Self-pay | Admitting: Adult Health

## 2020-09-12 NOTE — Progress Notes (Signed)
Provider:   Cindi Carbon, ANP [place] [phone]  Location: [place] of Service:  SNF (31)   PCP: Gayland Curry, DO Patient Care Team: Gayland Curry, DO as PCP - General (Geriatric Medicine) [name], MD as PCP - Cardiology (Cardiology)  Extended Emergency Contact Information Primary Emergency Contact: Branch,[name] Address: [address]          [place], [place] [address] [name] of [place] Phone: [phone] Mobile Phone: [phone] Relation: Spouse Secondary Emergency Contact: Sweigert,Ikey Address: [place] [address] [name] of [place] Phone: [phone] Mobile Phone: [phone] Relation: Son  Code Status: DNR Goals of Care: Advanced Directive information Advanced Directives 08/23/2020  Does Patient Have a Medical Advance Directive? Yes  Type of Advance Directive Living will;Out of facility DNR (pink MOST or yellow form)  Does patient want to make changes to medical advance directive? No - Patient declined  Copy of [place] in Chart? -  Pre-existing out of facility DNR order (yellow form or pink MOST form) Yellow form placed in chart (order not valid for inpatient use);Pink MOST form placed in chart (order not valid for inpatient use)     Chief Complaint  Patient presents with  . Annual Exam    HPI: Patient is a 87 y.o. male seen today for an annual comprehensive examination. He has a s/p cath due to urinary retention. Nursing staff using silver nitrate to reduce hypergranulation tissue which is helping. He denies any bladder pain or discomfort  Has vascular dementia with some disinhibition but remains pleasant, ambulatory, and participates in activities.   He has aged out of screening tests.   DNR with most form indicating limited interventions.      Past Medical History:  Diagnosis Date  . Adult failure to  thrive    Per incoming records from [facility]  . Allergic rhinitis    Per incoming records from [facility]  . Atrial fibrillation ([place])   . BPH (benign prostatic hyperplasia)    Per incoming records from [facility]  . Cancer of kidney [facility])    Right, Per incoming records from [facility]  . Cataract    Per incoming records from [facility]  . Cerebrovascular accident, late effects    Per incoming records from [facility]  . Chronic kidney disease, stage III (moderate) ([place])    Per incoming records from [facility]  . Claudication [facility])    Per incoming records from [facility]  . Cognitive impairment    Mild, Per incoming records from [facility]  . Constipation    Per incoming records from [facility]  . Dementia ([place])   . Diarrhea    Better off Aricept, Per incoming records from [facility]  . Diastolic dysfunction    on 2D Echo 07/2009 and 2016, Per incoming records from [facility]  . Diverticulosis    Mild, Per incoming records from [facility]  . Diverticulosis of colon (without mention of hemorrhage)   . ED (erectile dysfunction)    Per incoming records from [facility]  . History of Coumadin therapy    Per incoming records from [facility]  . History of echocardiogram 09/2014   Per incoming records from [facility]  . Hyperlipemia   .  Hypertension   . Kidney mass    Per incoming records from [facility]  . Lower leg edema    Per incoming records from [facility]  . Melanoma ([place])   . Neuropathy    Per incoming records from [facility]  . OA (osteoarthritis)    Per incoming records from [facility]  . Peripheral neuropathy    Per incoming records  from [facility]  . Personal history of colonic polyps 1999 & 2004   adenomatous polyps  . Pulmonary arterial hypertension ([place])    Per incoming records from [facility]  . Rosacea    Per incoming records from [facility]  . Thrombocytopenia ([place]) 2017   Per incoming records from [facility]  . UTI (urinary tract infection)    Sepsis, Per incoming records from [facility]  . Ventricular hypertrophy   . Walker as ambulation aid    Per incoming records from [employer]   Past Surgical History:  Procedure Laterality Date  . APPENDECTOMY     Per incoming records from [facility]  . CATARACT EXTRACTION     Per incoming records from [facility]  . IR CATHETER TUBE CHANGE  11/21/2019  . KNEE SURGERY     right  . PILONIDAL CYST DRAINAGE    . POLYPECTOMY     Per incoming records from [facility]  . schwanoma tumor lumbar spine    . SPINE SURGERY     tumor removed  . THORACIC 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, Per incoming records from [facility]  . TONSILLECTOMY AND ADENOIDECTOMY      reports that he quit smoking about 45 years ago. His smoking use included cigarettes. He smoked 3.00 packs per day. He has never used smokeless tobacco. He reports current alcohol use. He reports that he does not use drugs. Social History   Socioeconomic History  . Marital status: Married    Spouse name: Not on file  . Number of children: 2  . Years of education: Not on file  . Highest education level: Bachelor's degree (e.g., BA, AB, BS)  Occupational History  . Occupation: retired  Tobacco Use  . Smoking status: Former Smoker    Packs/day: 3.00    Types: Cigarettes    Quit date: 06/09/1975    Years since quitting: 45.2  . Smokeless tobacco: Never Used  Vaping Use  . Vaping Use: Never used  Substance and  Sexual Activity  . Alcohol use: Yes    Alcohol/week: 0.0 standard drinks    Comment: 2-3 drinks per day  . Drug use: No  . Sexual activity: Not on file  Other Topics Concern  . Not on file  Social History Narrative   Patient is married with 2 children, 3 grandchildren    Patient is retired Engineer, maintenance (IT)   Patient lives at [place] Strain: Not on [employer] Insecurity: Not on file  Transportation Needs: Not on file  Physical Activity: Not on file  Stress: Not on file  Social Connections: Not on file   Family History  Problem Relation Age of Onset  . Stroke Father   . CVA Father   . Tuberculosis Father   . Neuropathy Neg Hx     Pertinent  Health Maintenance Due  Topic Date Due  . INFLUENZA VACCINE  Completed  . PNA vac Low Risk Adult  Completed   Fall Risk  09/12/2020 10/30/2019 06/23/2019 05/07/2019 05/03/2019  Falls in the past year? 0 - 1 - -  Number falls in past yr: 0 - 1 - -  Injury with Fall? 0 - 1 - -  Risk for fall due to : - History of fall(s);Impaired balance/gait;Impaired mobility;Mental status change History of fall(s);Impaired balance/gait;Impaired mobility History of fall(s);Impaired balance/gait;Impaired mobility;Mental status change;Medication side effect History of fall(s);Impaired balance/gait;Impaired mobility;Medication side effect;Mental status change  Follow up - Falls evaluation completed;Education provided;Falls prevention discussed Falls evaluation completed Falls evaluation completed;Education provided;Falls prevention discussed Falls evaluation completed;Education provided;Falls prevention discussed  Comment - - [facility] at [place]   Depression screen [facility] 2/9 09/12/2020 06/23/2019  Decreased Interest 0 0  Down, Depressed, Hopeless 0 0  PHQ - 2 Score 0 0    Functional Status Survey: Is the patient deaf or have difficulty hearing?: No Does the patient have difficulty  seeing, even when wearing glasses/contacts?: No Does the patient have difficulty concentrating, remembering, or making decisions?: Yes Does the patient have difficulty walking or climbing stairs?: Yes Does the patient have difficulty dressing or bathing?: Yes Does the patient have difficulty doing errands alone such as visiting a doctor's office or shopping?: Yes  Allergies  Allergen Reactions  . Penicillins Rash    Has patient had a PCN reaction causing immediate rash, facial/tongue/throat swelling, SOB or lightheadedness with hypotension: unknown Has patient had a PCN reaction causing severe rash involving mucus membranes or skin necrosis: unknown Has patient had a PCN reaction that required hospitalization : unknown Has patient had a PCN reaction occurring within the last 10 years: no If all of the above answers are "NO", then may proceed with Cephalosporin use.     Outpatient Encounter Medications as of 09/10/2020  Medication Sig  . acetaminophen (TYLENOL) 500 MG tablet Take 500 mg by mouth 2 (two) times daily. Scheduled and every 4 hours as needed (ending 05/05/2019)Document area of discomfort and effectiveness of medication.  Marland Kitchen atorvastatin (LIPITOR) 20 MG tablet Take 20 mg by mouth every evening.  . cholecalciferol (VITAMIN D3) 25 MCG (1000 UT) tablet Take 2,000 Units by mouth daily.   . DULoxetine (CYMBALTA) 20 MG capsule Take 20 mg by mouth daily.  . methenamine (MANDELAMINE) 1 g tablet Take 1,000 mg by mouth 2 (two) times daily.  . mineral oil-hydrophilic petrolatum (AQUAPHOR) ointment Apply topically daily. Right cheek and scalp  . polyethylene glycol (MIRALAX / GLYCOLAX) 17 g packet Take 17 g by mouth daily.  . silver nitrate applicators 76-28 % applicator Apply 1 application topically 3 (three) times a week. Monday, Wednesday, and Friday for wound care until resolved  . triamcinolone lotion (KENALOG) 0.1 % Apply 1 application topically 2 (two) times daily as needed.   .  warfarin (COUMADIN) 5 MG tablet Take 5 mg by mouth daily.   No facility-administered encounter medications on file as of 09/10/2020.    Review of Systems  Vitals:   09/10/20 1428  Weight: 264 lb 11.2 oz (120.1 kg)   Body mass index is 29.82 kg/m. Physical Exam  Labs reviewed: Basic Metabolic Panel: Recent Labs    10/06/19 1010 11/14/19 0000 11/21/19 0000 11/21/19 0600 12/08/19 0000 03/02/20 0000  NA 137   < > 144  144  --  141  141 139  K 4.0   < > 4.1  4.1  --  4.8  4.8 3.9  CL 103   < > 107  107  --  105  105 105  CO2 25   < > 25*  25*  --  20  20 23*  GLUCOSE 104*  --   --   --   --   --   BUN 19   < > 24*  24*  --  14  14 18   CREATININE 1.01   < > 0.8  0.8  --  0.9  0.9 0.8  CALCIUM 9.6   < >  --  10.5 10.6  10.6 10.0   < > = values in this interval not displayed.   Liver Function Tests: No results for input(s): AST, ALT, ALKPHOS, BILITOT, PROT, ALBUMIN in the last 8760 hours. No results for input(s): LIPASE, AMYLASE in the last 8760 hours. No results for input(s): AMMONIA in the last 8760 hours. CBC: Recent Labs    10/06/19 1010 11/14/19 0000 11/17/19 0700 11/21/19 1340 03/02/20 0000 03/05/20 0000 08/09/20 0000  WBC 2.4* 9.4   < > 2.7* 5.0 3.2  3.2 3.4  NEUTROABS 1.4* 8  --   --   --   --   --   HGB 13.7 13.2*   < > 13.6 15.0 14.4  14.4 14.2  HCT 43.9 39*   < > 43.6 45 43 42  MCV 94.0  --   --  93.8  --   --   --   PLT 109* 133*   < > 154 93* 113*  113* 80*   < > = values in this interval not displayed.   Cardiac Enzymes: No results for input(s): CKTOTAL, CKMB, CKMBINDEX, TROPONINI in the last 8760 hours. BNP: Invalid input(s): POCBNP Lab Results  Component Value Date   HGBA1C 6 08/22/2019   Lab Results  Component Value Date   TSH 0.84 05/19/2019   Lab Results  Component Value Date   VITAMINB12 386 11/05/2016   Lab Results  Component Value Date   FOLATE >20.0 11/05/2016   No results found for: IRON, TIBC,  FERRITIN  Imaging and Procedures obtained recently: No results found.  Assessment/Plan 1. Vascular dementia without behavioral disturbance (HCC) Moderate Remains ambulatory but requires cuing and reminders.  Continue supportive care in the skilled environment.   2. Neutropenia, unspecified type [facility]) Lab Results  Component Value Date   WBC 3.4 08/09/2020  Continue to monitor No aggressive care due to his dementia.  He has seen Dr Alen Blew in the past with MDS as a differential dx, could reconsult if needed    3. Thrombocytopenia ([place]) Lab Results  Component Value Date   PLT 80 (A) 08/09/2020  see+#2  4. Suprapubic catheter [facility]) Cath site with hypergranulation tissue reducing with wound care and silver nitrate sticks  Continue Silver nitrate three times weekly  Change s/p dressing daily   5. Urinary retention due to benign prostatic hyperplasia Now with s/p cath  No current s/s of infection   6. Renal mass, right CT 06/16/18 revealed right upper pole renal mass measuring 3.8 x 2.2 cm with no adenopathy or renal vein involvement.  Followed by urology. No current symptoms.  Due to his dementia/goals of care aggressive treatment would not be indicated.   7. Peripheral vascular disease ([place]) 08/18/19: ABI Right lower ext: WNL 1.05 Left lower ext: moderate arterial disease 0.62 Continue Lipitor 20 mg qd  Continue anticoag BP controlled   8. Longstanding persistent atrial fibrillation (HCC) Rate is controlled without meds  9. Chronic anticoagulation Continues on Coumadin for CVA risk reduction at 5 mg qd INR monthly  10. Chronic diastolic heart failure (HCC) Some mild edema in his hands and feet but no resp symptoms with controlled BP If worsening would add low dose lasix.  Had low sodium with HCTZ before  11. Slow transit constipation Continue miralax daily   12. Hereditary and idiopathic peripheral neuropathy To both feet, no current issues with pain or  difficulty walking     Family/ staff Communication: nurse   Labs/tests ordered:NA

## 2020-09-27 DIAGNOSIS — Z7901 Long term (current) use of anticoagulants: Secondary | ICD-10-CM | POA: Diagnosis not present

## 2020-09-27 LAB — POCT INR: INR: 2.4 — AB (ref 0.9–1.1)

## 2020-09-27 LAB — PROTIME-INR: Protime: 24.4 — AB (ref 10.0–13.8)

## 2020-10-01 ENCOUNTER — Encounter: Payer: Self-pay | Admitting: *Deleted

## 2020-10-08 DIAGNOSIS — N390 Urinary tract infection, site not specified: Secondary | ICD-10-CM | POA: Diagnosis not present

## 2020-10-11 ENCOUNTER — Non-Acute Institutional Stay (SKILLED_NURSING_FACILITY): Payer: Medicare Other | Admitting: Adult Health

## 2020-10-11 ENCOUNTER — Encounter: Payer: Self-pay | Admitting: Adult Health

## 2020-10-11 DIAGNOSIS — R338 Other retention of urine: Secondary | ICD-10-CM

## 2020-10-11 DIAGNOSIS — D696 Thrombocytopenia, unspecified: Secondary | ICD-10-CM | POA: Diagnosis not present

## 2020-10-11 DIAGNOSIS — N401 Enlarged prostate with lower urinary tract symptoms: Secondary | ICD-10-CM

## 2020-10-11 DIAGNOSIS — Z9359 Other cystostomy status: Secondary | ICD-10-CM | POA: Diagnosis not present

## 2020-10-11 DIAGNOSIS — F0150 Vascular dementia without behavioral disturbance: Secondary | ICD-10-CM

## 2020-10-11 DIAGNOSIS — I5032 Chronic diastolic (congestive) heart failure: Secondary | ICD-10-CM | POA: Diagnosis not present

## 2020-10-11 DIAGNOSIS — E7849 Other hyperlipidemia: Secondary | ICD-10-CM

## 2020-10-11 NOTE — Progress Notes (Signed)
Location:  Occupational psychologist of Service:  SNF (31) Provider:   Cindi Carbon, [place] [phone]   [name], MD  [name], [name] [name], MD as PCP - Cardiology (Cardiology)  Extended Emergency Contact Information Primary Emergency Contact: Jaggers,[name] Address: [address]          [place], [place] [address] [name] of [place] Phone: [phone] Mobile Phone: [phone] Relation: Spouse Secondary Emergency Contact: Demonbreun,Mackey Address: [place] [address] [name] of [place] Phone: [phone] Mobile Phone: [phone] Relation: Son  Code Status:  DNR Goals of care: Advanced Directive information Advanced Directives 08/23/2020  Does Patient Have a Medical Advance Directive? Yes  Type of Advance Directive Living will;Out of facility DNR (pink MOST or yellow form)  Does patient want to make changes to medical advance directive? No - Patient declined  Copy of [place] in Chart? -  Pre-existing out of facility DNR order (yellow form or pink MOST form) Yellow form placed in chart (order not valid for inpatient use);Pink MOST form placed in chart (order not valid for inpatient use)     Chief Complaint  Patient presents with  . Medical Management of Chronic Issues    HPI:  Pt is a 87 y.o. male seen today for medical management of chronic diseases.    On 4/4 he was noted to be more confused and having difficulty urinating standing at the toilet feeling the need to void. He has a s/p cath due to BPH with urinary retention. The catheter appeared to be draining well at the time. The nurse also noted s/p tenderness. A UA was sent and returned with no predominant organism present for UTI.   For my visit today he appears to be back at baseline. He is alert with some confusion due  to dementia and occasionally makes inappropriate comments. He is ambulatory and able to communicate his needs. He denies any bladder pain, dysuria, frequency, or other symptoms.    BP has been in the 130-160 range.   He is receiving silver nitrate to his s/p site to help with hypergranulation tissue.   Thrombocytopenia trending downward.  Lab Results  Component Value Date   PLT 80 (A) 08/09/2020    Past Medical History:  Diagnosis Date  . Adult failure to thrive    Per incoming records from [facility]  . Allergic rhinitis    Per incoming records from [facility]  . Atrial fibrillation ([place])   . BPH (benign prostatic hyperplasia)    Per incoming records from [facility]  . Cancer of kidney [facility])    Right, Per incoming records from [facility]  . Cataract    Per incoming records from [facility]  . Cerebrovascular accident, late effects    Per incoming records from [facility]  . Chronic kidney disease, stage III (moderate) ([place])    Per incoming records from [facility]  . Claudication [facility])    Per incoming records from [facility]  . Cognitive impairment    Mild, Per incoming records from [facility]  . Constipation    Per incoming records from [facility]  . Dementia ([place])   . Diarrhea    Better off Aricept, Per incoming records from  [employer]  . Diastolic dysfunction    on 2D Echo 07/2009 and 2016, Per incoming records from [facility]  . Diverticulosis    Mild, Per incoming records from [facility]  . Diverticulosis of colon (without mention of hemorrhage)   . ED (erectile dysfunction)    Per incoming records from [facility]  . History of Coumadin therapy    Per incoming records from [facility]  . History of echocardiogram  09/2014   Per incoming records from [facility]  . Hyperlipemia   . Hypertension   . Kidney mass    Per incoming records from [facility]  . Lower leg edema    Per incoming records from [facility]  . Melanoma ([place])   . Neuropathy    Per incoming records from [facility]  . OA (osteoarthritis)    Per incoming records from [facility]  . Peripheral neuropathy    Per incoming records from [facility]  . Personal history of colonic polyps 1999 & 2004   adenomatous polyps  . Pulmonary arterial hypertension ([place])    Per incoming records from [facility]  . Rosacea    Per incoming records from [facility]  . Thrombocytopenia ([place]) 2017   Per incoming records from [facility]  . UTI (urinary tract infection)    Sepsis, Per incoming records from [facility]  . Ventricular hypertrophy   . Walker as ambulation aid    Per incoming records from [employer]   Past Surgical History:  Procedure Laterality Date  . APPENDECTOMY     Per incoming records from [facility]  . CATARACT EXTRACTION     Per incoming records from [facility]  . IR CATHETER TUBE CHANGE  11/21/2019  . KNEE SURGERY     right  . PILONIDAL CYST DRAINAGE    . POLYPECTOMY     Per incoming records from [facility]  . schwanoma tumor lumbar spine    . SPINE SURGERY     tumor removed  . THORACIC 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, Per incoming records from [facility]  . TONSILLECTOMY AND ADENOIDECTOMY      Allergies  Allergen Reactions  . Penicillins Rash    Has patient had a PCN reaction causing immediate rash, facial/tongue/throat swelling, SOB or lightheadedness with hypotension: unknown Has patient had a PCN reaction causing severe rash involving mucus  membranes or skin necrosis: unknown Has patient had a PCN reaction that required hospitalization : unknown Has patient had a PCN reaction occurring within the last 10 years: no If all of the above answers are "NO", then may proceed with Cephalosporin use.     Outpatient Encounter Medications as of 10/11/2020  Medication Sig  . acetaminophen (TYLENOL) 500 MG tablet Take 500 mg by mouth 2 (two) times daily. Scheduled and every 4 hours as needed (ending 05/05/2019)Document area of discomfort and effectiveness of medication.  Marland Kitchen atorvastatin (LIPITOR) 20 MG tablet Take 20 mg by mouth every evening.  . cholecalciferol (VITAMIN D3) 25 MCG (1000 UT) tablet Take 2,000 Units by mouth daily.   . DULoxetine (CYMBALTA) 20 MG capsule Take 20 mg by mouth daily.  . methenamine (MANDELAMINE) 1 g tablet Take 1,000 mg by mouth 2 (two) times daily.  . mineral oil-hydrophilic petrolatum (AQUAPHOR) ointment Apply topically daily. Right cheek and scalp  .  polyethylene glycol (MIRALAX / GLYCOLAX) 17 g packet Take 17 g by mouth daily.  . silver nitrate applicators 00-17 % applicator Apply 1 application topically 3 (three) times a week. Monday, Wednesday, and Friday for wound care until resolved  . triamcinolone lotion (KENALOG) 0.1 % Apply 1 application topically 2 (two) times daily as needed.   . warfarin (COUMADIN) 5 MG tablet Take 5 mg by mouth daily.   No facility-administered encounter medications on file as of 10/11/2020.    Review of Systems  Constitutional: Negative for activity change, appetite change, chills, diaphoresis, fatigue, fever and unexpected weight change.  HENT: Negative for congestion and trouble swallowing.   Respiratory: Negative for cough, shortness of breath, wheezing and stridor.   Cardiovascular: Positive for leg swelling. Negative for chest pain and palpitations.  Gastrointestinal: Negative for abdominal distention, abdominal pain, constipation and diarrhea.  Genitourinary: Negative for  decreased urine volume, difficulty urinating, dysuria, frequency, hematuria and urgency.  Musculoskeletal: Positive for gait problem. Negative for arthralgias, back pain, joint swelling and myalgias.  Neurological: Negative for dizziness, seizures, syncope, facial asymmetry, speech difficulty, weakness and headaches.  Hematological: Negative for adenopathy. Does not bruise/bleed easily.  Psychiatric/Behavioral: Positive for confusion. Negative for agitation and behavioral problems.    Immunization History  Administered Date(s) Administered  . Influenza, High Dose Seasonal PF 05/04/2020  . Influenza-Unspecified 04/17/2014, 05/01/2016, 04/15/2019  . Moderna SARS-COV2 Booster Vaccination 05/17/2020  . Moderna Sars-Covid-2 Vaccination 07/12/2019, 08/09/2019  . Pneumococcal Conjugate-13 04/20/2014  . Pneumococcal Polysaccharide-23 07/08/2003, 03/31/2011  . Pneumococcal-Unspecified 01/26/2004  . Td 08/23/2007  . Tdap 01/07/2012  . Zoster 07/17/2005  . Zoster Recombinat (Shingrix) 10/05/2017, 12/09/2017   Pertinent  Health Maintenance Due  Topic Date Due  . INFLUENZA VACCINE  02/04/2021  . PNA vac Low Risk Adult  Completed   Fall Risk  09/12/2020 10/30/2019 06/23/2019 05/07/2019 05/03/2019  Falls in the past year? 0 - 1 - -  Number falls in past yr: 0 - 1 - -  Injury with Fall? 0 - 1 - -  Risk for fall due to : - History of fall(s);Impaired balance/gait;Impaired mobility;Mental status change History of fall(s);Impaired balance/gait;Impaired mobility History of fall(s);Impaired balance/gait;Impaired mobility;Mental status change;Medication side effect History of fall(s);Impaired balance/gait;Impaired mobility;Medication side effect;Mental status change  Follow up - Falls evaluation completed;Education provided;Falls prevention discussed Falls evaluation completed Falls evaluation completed;Education provided;Falls prevention discussed Falls evaluation completed;Education provided;Falls prevention  discussed  Comment - - - [facility] at [facility] all addressed at [place]:    Vitals:   10/11/20 1139  Weight: 267 lb 14.4 oz (121.5 kg)   Body mass index is 30.18 kg/m.  Wt Readings from Last 3 Encounters:  10/11/20 267 lb 14.4 oz (121.5 kg)  09/10/20 264 lb 11.2 oz (120.1 kg)  08/23/20 267 lb 4.8 oz (121.2 kg)    Physical Exam Vitals and nursing note reviewed.  Constitutional:      General: He is not in acute distress.    Appearance: He is not diaphoretic.  HENT:     Head: Normocephalic and atraumatic.     Nose: Nose normal. No congestion.     Mouth/Throat:     Mouth: Mucous membranes are moist.     Pharynx: Oropharynx is clear.  Eyes:     Conjunctiva/sclera: Conjunctivae normal.     Pupils: Pupils are equal, round, and reactive to light.  Neck:     Thyroid: No thyromegaly.     Vascular: No JVD.  Trachea: No tracheal deviation.  Cardiovascular:     Rate and Rhythm: Normal rate. Rhythm irregular.     Heart sounds: No murmur heard.   Pulmonary:     Effort: Pulmonary effort is normal. No respiratory distress.     Breath sounds: Normal breath sounds. No wheezing.  Abdominal:     General: Bowel sounds are normal. There is no distension.     Palpations: Abdomen is soft.     Tenderness: There is no abdominal tenderness.     Comments: S/p cath site without drainage or redness. Hypergranulation tissue is darker and smaller in appearance.   Musculoskeletal:     Right lower leg: Edema (+1) present.     Left lower leg: Edema (+1) present.  Lymphadenopathy:     Cervical: No cervical adenopathy.  Skin:    General: Skin is warm and dry.  Neurological:     Mental Status: He is alert and oriented to person, place, and time.     Cranial Nerves: No cranial nerve deficit.     Labs reviewed: Recent Labs    11/21/19 0000 11/21/19 0600 12/08/19 0000 03/02/20 0000  NA 144  144  --  141  141 139  K 4.1  4.1  --  4.8  4.8 3.9  CL 107   107  --  105  105 105  CO2 25*  25*  --  20  20 23*  BUN 24*  24*  --  14  14 18   CREATININE 0.8  0.8  --  0.9  0.9 0.8  CALCIUM  --  10.5 10.6  10.6 10.0   No results for input(s): AST, ALT, ALKPHOS, BILITOT, PROT, ALBUMIN in the last 8760 hours. Recent Labs    11/14/19 0000 11/17/19 0700 11/21/19 1340 03/02/20 0000 03/05/20 0000 08/09/20 0000  WBC 9.4   < > 2.7* 5.0 3.2  3.2 3.4  NEUTROABS 8  --   --   --   --   --   HGB 13.2*   < > 13.6 15.0 14.4  14.4 14.2  HCT 39*   < > 43.6 45 43 42  MCV  --   --  93.8  --   --   --   PLT 133*   < > 154 93* 113*  113* 80*   < > = values in this interval not displayed.   Lab Results  Component Value Date   TSH 0.84 05/19/2019   Lab Results  Component Value Date   HGBA1C 6 08/22/2019   Lab Results  Component Value Date   CHOL 129 08/09/2020   HDL 32 (A) 08/09/2020   LDLCALC 80 08/09/2020   TRIG 86 08/09/2020    Significant Diagnostic Results in last 30 days:  No results found.  Assessment/Plan  1. Vascular dementia without behavioral disturbance (HCC) Moderate  Continue supportive care in the skilled environment.   2. Urinary retention due to benign prostatic hyperplasia Continue cath maintenance per [facility] and f/u with urology as indicated  3. Suprapubic catheter ([place]) Flowing well Continue silver nitrate sticks  4. Thrombocytopenia ([place]) Trending down  ? If this is due to MDS Seen by [name] in the past Continue to monitor   5. Chronic diastolic heart failure (HCC) Weight stable No current symptoms Continues with mild edema to the hand and legs. Continue compression hose and elevation (had low sodium and elevated Ca with HCTZ before)  6. Other hyperlipidemia Lab Results  Component Value Date  LDLCALC 80 08/09/2020  Continue Lipitor 20 mg qd     Family/ staff Communication: nurse   Labs/tests ordered:  INR due later this month

## 2020-10-25 DIAGNOSIS — Z7901 Long term (current) use of anticoagulants: Secondary | ICD-10-CM | POA: Diagnosis not present

## 2020-10-30 DIAGNOSIS — N390 Urinary tract infection, site not specified: Secondary | ICD-10-CM | POA: Diagnosis not present

## 2020-11-05 DIAGNOSIS — I10 Essential (primary) hypertension: Secondary | ICD-10-CM | POA: Diagnosis not present

## 2020-11-05 DIAGNOSIS — Z7901 Long term (current) use of anticoagulants: Secondary | ICD-10-CM | POA: Diagnosis not present

## 2020-11-05 LAB — PROTIME-INR: INR: 2.2 — AB (ref 0.9–1.1)

## 2020-11-19 DIAGNOSIS — Z7901 Long term (current) use of anticoagulants: Secondary | ICD-10-CM | POA: Diagnosis not present

## 2020-11-19 LAB — POCT INR: INR: 2.2 — AB (ref 0.9–1.1)

## 2020-11-26 ENCOUNTER — Non-Acute Institutional Stay (SKILLED_NURSING_FACILITY): Payer: Medicare Other | Admitting: Internal Medicine

## 2020-11-26 ENCOUNTER — Encounter: Payer: Self-pay | Admitting: Internal Medicine

## 2020-11-26 DIAGNOSIS — I5032 Chronic diastolic (congestive) heart failure: Secondary | ICD-10-CM | POA: Diagnosis not present

## 2020-11-26 DIAGNOSIS — I4811 Longstanding persistent atrial fibrillation: Secondary | ICD-10-CM | POA: Diagnosis not present

## 2020-11-26 DIAGNOSIS — N2889 Other specified disorders of kidney and ureter: Secondary | ICD-10-CM | POA: Diagnosis not present

## 2020-11-26 DIAGNOSIS — E7849 Other hyperlipidemia: Secondary | ICD-10-CM | POA: Diagnosis not present

## 2020-11-26 DIAGNOSIS — Z9359 Other cystostomy status: Secondary | ICD-10-CM | POA: Diagnosis not present

## 2020-11-26 DIAGNOSIS — F0150 Vascular dementia without behavioral disturbance: Secondary | ICD-10-CM

## 2020-11-26 DIAGNOSIS — D696 Thrombocytopenia, unspecified: Secondary | ICD-10-CM | POA: Diagnosis not present

## 2020-11-26 NOTE — Progress Notes (Signed)
Location:    [place] Room Number: 142 Place of Service:  SNF [phone]) Provider:  Veleta Miners MD  [name], MD  [name], [name] [name], MD as PCP - Cardiology (Cardiology)  Extended Emergency Contact Information Primary Emergency Contact: Dunkleberger,[name] Address: [address]          [place], [place] [address] [name] of [place] Phone: [phone] Mobile Phone: [phone] Relation: Spouse Secondary Emergency Contact: Breed,Casen Address: [place] [address] [name] of [place] Phone: [phone] Mobile Phone: [phone] Relation: Son  Code Status:  DNR Goals of care: Advanced Directive information Advanced Directives 11/26/2020  Does Patient Have a Medical Advance Directive? Yes  Type of Advance Directive Out of facility DNR (pink MOST or yellow form);Living will  Does patient want to make changes to medical advance directive? No - Patient declined  Copy of [place] in Chart? -  Pre-existing out of facility DNR order (yellow form or pink MOST form) Pink MOST form placed in chart (order not valid for inpatient use)     Chief Complaint  Patient presents with  . Medical Management of Chronic Issues    HPI:  Pt is a 87 y.o. male seen today for medical management of chronic diseases.   Has h/o A Fi, Chronic Diastolic CHF, HLD  Thrombocytopenia, Has seen Dr Alen Blew in 03/2018 Told to follow  Right Real Mass seen in [place] in 2018 H/o Urinary Retension s/p Suprapubic Cathter H/o Vascular Dementia MRI in    Doing well no new Issues Walks with his walker Weight stable Nurses did not have any issues Mood stable    Past Medical History:  Diagnosis Date  . Adult failure to thrive    Per incoming records from [facility]  . Allergic rhinitis    Per incoming records  from [facility]  . Atrial fibrillation ([place])   . BPH (benign prostatic hyperplasia)    Per [facility]  . Cancer of kidney [facility])    Right, Per incoming records from [facility]  . Cataract    Per incoming records from [facility]  . Cerebrovascular accident, late effects    Per incoming records from [facility]  . Chronic kidney disease, stage III (moderate) ([place])    Per incoming records from [facility]  . Claudication [facility])    Per incoming records from [facility]  . Cognitive impairment    Mild, Per incoming records from [facility]  . Constipation    Per incoming records from [facility]  . Dementia ([place])   . Diarrhea    Better off Aricept, Per incoming records from [facility]  . Diastolic dysfunction    on 2D Echo 07/2009 and 2016, Per incoming records from [facility]  . Diverticulosis    Mild, Per incoming records from [facility]  . Diverticulosis of colon (without mention of hemorrhage)   . ED (erectile dysfunction)    Per incoming records from [facility]  . History of Coumadin therapy    Per incoming records from [facility]  . History of echocardiogram 09/2014   Per incoming records from [facility]  . Hyperlipemia   . Hypertension   . Kidney  mass    [facility]  . Lower leg edema    [facility]  . Melanoma (HCC)   . Neuropathy    [facility]  . OA (osteoarthritis)    [facility]  . Peripheral neuropathy    [facility]  . Personal history of colonic polyps 1999 & 2004   adenomatous polyps  .  Pulmonary arterial hypertension (HCC)    [facility]  . Rosacea    [facility]  . Thrombocytopenia (HCC) 2017   [facility]  . UTI (urinary tract infection)    Sepsis, Per incoming records [facility]  . Ventricular hypertrophy   . [place] as ambulation aid    [facility]   Past Surgical History:  Procedure Laterality Date  . APPENDECTOMY     [facility]  . CATARACT EXTRACTION     [facility]  . IR CATHETER TUBE CHANGE  11/21/2019  . KNEE SURGERY     right  . PILONIDAL CYST DRAINAGE    . POLYPECTOMY     [facility]  . schwanoma tumor lumbar spine    . SPINE SURGERY     tumor removed  . THORACIC 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, Per [facility]  . TONSILLECTOMY AND ADENOIDECTOMY      Allergies  Allergen Reactions  . Penicillins Rash    Has patient had a PCN reaction causing immediate rash, facial/tongue/throat swelling, SOB or lightheadedness with hypotension: unknown Has patient had a PCN reaction causing severe rash involving mucus membranes or skin necrosis: unknown Has patient had a PCN reaction that required hospitalization : unknown Has patient had a PCN reaction occurring within the last 10 years: no If all of the above answers are "NO", then may proceed with Cephalosporin use.     Allergies as of 11/26/2020      Reactions   Penicillins Rash   Has patient had a PCN reaction causing immediate rash, facial/tongue/throat swelling, SOB or lightheadedness with hypotension: unknown Has patient had a PCN reaction causing severe rash involving mucus membranes or skin necrosis: unknown Has patient had  a PCN reaction that required hospitalization : unknown Has patient had a PCN reaction occurring within the last 10 years: no If all of the above answers are "NO", then may proceed with Cephalosporin use.      Medication List       Accurate as of Nov 26, 2020 11:37 AM. If you have any questions, ask your nurse or doctor.        acetaminophen 500 MG tablet Commonly known as: TYLENOL Take 500 mg by mouth 2 (two) times daily. Scheduled and every 4 hours as needed (ending 05/05/2019)Document area of discomfort and effectiveness of medication.   atorvastatin 20 MG tablet Commonly known as: LIPITOR Take 20 mg by mouth every evening.   cholecalciferol 25 MCG (1000 UNIT) tablet Commonly known as: VITAMIN D3 Take 2,000 Units by mouth daily.   DULoxetine 20 MG capsule Commonly known as: CYMBALTA Take 20 mg by mouth daily.   methenamine 1 g tablet Commonly known as: MANDELAMINE Take  1,000 mg by mouth 2 (two) times daily.   mineral oil-hydrophilic petrolatum ointment Apply topically daily. Right cheek and scalp   polyethylene glycol 17 g packet Commonly known as: MIRALAX / GLYCOLAX Take 17 g by mouth daily.   silver nitrate applicators 29-92 % applicator Apply 1 application topically 3 (three) times a week. Monday, Wednesday, and Friday for wound care until resolved   triamcinolone lotion 0.1 % Commonly known as: KENALOG Apply 1 application topically 2 (two) times daily as needed.   warfarin 5 MG tablet Commonly known as: COUMADIN Take 5 mg by mouth daily.       Review of Systems  Unable to perform ROS: Dementia    Immunization History  Administered Date(s) Administered  . Influenza, High Dose Seasonal PF 05/04/2020  . Influenza-Unspecified 04/17/2014, 05/01/2016, 04/15/2019  . Moderna SARS-COV2 Booster Vaccination 05/17/2020  . Moderna Sars-Covid-2 Vaccination 07/12/2019, 08/09/2019  . Pneumococcal Conjugate-13 04/20/2014  . Pneumococcal Polysaccharide-23  07/08/2003, 03/31/2011  . Pneumococcal-Unspecified 01/26/2004  . Td 08/23/2007  . Tdap 01/07/2012  . Zoster 07/17/2005  . Zoster Recombinat (Shingrix) 10/05/2017, 12/09/2017   Pertinent  Health Maintenance Due  Topic Date Due  . INFLUENZA VACCINE  02/04/2021  . PNA vac Low Risk Adult  Completed   Fall Risk  09/12/2020 10/30/2019 06/23/2019 05/07/2019 05/03/2019  Falls in the past year? 0 - 1 - -  Number falls in past yr: 0 - 1 - -  Injury with Fall? 0 - 1 - -  Risk for fall due to : - History of fall(s);Impaired balance/gait;Impaired mobility;Mental status change History of fall(s);Impaired balance/gait;Impaired mobility History of fall(s);Impaired balance/gait;Impaired mobility;Mental status change;Medication side effect History of fall(s);Impaired balance/gait;Impaired mobility;Medication side effect;Mental status change  Follow up - Falls evaluation completed;Education provided;Falls prevention discussed Falls evaluation completed Falls evaluation completed;Education provided;Falls prevention discussed Falls evaluation completed;Education provided;Falls prevention discussed  Comment - - - [facility] all addressed at [place]:    Vitals:   11/26/20 1132  BP: 135/72  Pulse: (!) 52  Resp: 19  Temp: 97.9 F (36.6 C)  SpO2: 95%  Weight: 268 lb 6.4 oz (121.7 kg)  Height: 6\' 7"  (2.007 m)   Body mass index is 30.24 kg/m. Physical Exam Constitutional: . Well-developed and well-nourished.  HENT:  Head: Normocephalic.  Mouth/Throat: Oropharynx is clear and moist.  Eyes: Pupils are equal, round, and reactive to light.  Neck: Neck supple.  Cardiovascular: Normal rate and normal heart sounds.  No murmur heard. Pulmonary/Chest: Effort normal and breath sounds normal. No respiratory distress. No wheezes. She has no rales.  Abdominal: Soft. Bowel sounds are normal. No distension. There is no tenderness. There is no rebound.  Musculoskeletal: Mild  Edema Bilateral Lymphadenopathy: none Neurological: Responds Appropriately Walks with his walker  Skin: Skin is warm and dry.  Psychiatric: Normal mood and affect. Behavior is normal. Thought content normal.   Labs reviewed: Recent Labs    12/08/19 0000 03/02/20 0000  NA 141  141 139  K 4.8  4.8 3.9  CL 105  105 105  CO2 20  20 23*  BUN 14  14 18   CREATININE 0.9  0.9 0.8  CALCIUM 10.6  10.6 10.0   No results for input(s): AST, ALT, ALKPHOS, BILITOT, PROT, ALBUMIN in the last 8760 hours. Recent Labs    03/02/20 0000 03/05/20 0000 08/09/20 0000  WBC 5.0 3.2  3.2 3.4  HGB 15.0 14.4  14.4 14.2  HCT 45 43 42  PLT 93* 113*  113* 80*   Lab Results  Component Value Date   TSH 0.84 05/19/2019   Lab Results  Component Value Date   HGBA1C 6 08/22/2019   Lab Results  Component Value Date   CHOL 129 08/09/2020   HDL 32 (A) 08/09/2020   LDLCALC 80 08/09/2020   TRIG 86 08/09/2020    Significant Diagnostic Results in last 30 days:  No results found.  Assessment/Plan Vascular dementia without behavioral disturbance (HCC) Supportive care Suprapubic catheter [facility]) Doing well On Methanimine Thrombocytopenia ([place]) ? Etiology Has Seen Hematology before they recommended Surveillance and No further Work up [place] in 2018 did not show any splenomegaly  Chronic diastolic heart failure ([place]) Just Ted hoses now Other hyperlipidemia On Statin Longstanding persistent atrial fibrillation ([place]) On Coumadin ? Renal Mass in [place] in 2018 ? Ever worked up No issues right now  Pharmacist, hospital Communication:   Labs/tests ordered:

## 2020-12-07 ENCOUNTER — Encounter: Payer: Self-pay | Admitting: Adult Health

## 2020-12-07 ENCOUNTER — Non-Acute Institutional Stay (SKILLED_NURSING_FACILITY): Payer: Medicare Other | Admitting: Adult Health

## 2020-12-07 DIAGNOSIS — N401 Enlarged prostate with lower urinary tract symptoms: Secondary | ICD-10-CM

## 2020-12-07 DIAGNOSIS — R338 Other retention of urine: Secondary | ICD-10-CM | POA: Diagnosis not present

## 2020-12-07 DIAGNOSIS — D696 Thrombocytopenia, unspecified: Secondary | ICD-10-CM | POA: Diagnosis not present

## 2020-12-07 DIAGNOSIS — F0150 Vascular dementia without behavioral disturbance: Secondary | ICD-10-CM

## 2020-12-07 LAB — BASIC METABOLIC PANEL
BUN: 18 (ref 4–21)
CO2: 26 — AB (ref 13–22)
Chloride: 105 (ref 99–108)
Creatinine: 1.2 (ref 0.6–1.3)
Glucose: 120
Potassium: 4.3 (ref 3.4–5.3)
Sodium: 141 (ref 137–147)

## 2020-12-07 LAB — CBC AND DIFFERENTIAL
HCT: 40 — AB (ref 41–53)
Hemoglobin: 13.5 (ref 13.5–17.5)
Platelets: 93 — AB (ref 150–399)
WBC: 2.5

## 2020-12-07 LAB — CBC: RBC: 4.62 (ref 3.87–5.11)

## 2020-12-07 LAB — COMPREHENSIVE METABOLIC PANEL: Calcium: 10.2 (ref 8.7–10.7)

## 2020-12-07 NOTE — Progress Notes (Signed)
Location:  [place] Room Number: 142-A Place of Service:  SNF [phone]) Provider:  [name], NP    [name], [name] [name], MD as PCP - Cardiology (Cardiology)  Extended Emergency Contact Information Primary Emergency Contact: Durnell,[name] Address: [address]          [place], [place] [address] [name] of [place] Phone: [phone] Mobile Phone: [phone] Relation: Spouse Secondary Emergency Contact: Cafarelli,Desi Address: [place] [address] [name] of [place] Phone: [phone] Mobile Phone: [phone] Relation: Son  Code Status:  DNR  Goals of care: Advanced Directive information Advanced Directives 12/07/2020  Does Patient Have a Medical Advance Directive? Yes  Type of Advance Directive Out of facility DNR (pink MOST or yellow form);Living will  Does patient want to make changes to medical advance directive? No - Patient declined  Copy of [place] in Chart? -  Pre-existing out of facility DNR order (yellow form or pink MOST form) Yellow form placed in chart (order not valid for inpatient use);Pink MOST form placed in chart (order not valid for inpatient use)     Chief Complaint  Patient presents with  . Acute Visit    Increased confusion     HPI:  Pt is a 87 y.o. male seen today for an acute visit for increased confusion. He has a hx of vascular dementia and resides in skilled care. His night nurse wrote an Paramedic indicating that he was more confused and they were concerned for a UTI. He was treated for a UTI last month with Keflex.  He is on prophylactic antibiotics as well due to having a suprapubic cath with a hx of recurrent UTI.  He does not have a fever and BP/HR are stable. He denies any bladder pain, burning, body aches, or decreased appetite. He is at baseline cognitively  per the nurse. He is walking around with his walker, eating well, alert and pleasant.   Past Medical History:  Diagnosis Date  . Adult failure to thrive    Per incoming records from [facility]  . Allergic rhinitis    Per incoming records from [facility]  . Atrial fibrillation ([place])   . BPH (benign prostatic hyperplasia)    Per incoming records from [facility]  . Cancer of kidney [facility])    Right, Per incoming records from [facility] (2[facility]Rh)  . Cataract    Per incoming records from [facility]  . Cerebrovascular accident, late effects    Per incoming records from [facility]  . Chronic kidney disease, stage III (moderate) ([place])    Per incoming records from [facility]  . Claudication [facility])    Per incoming records from [facility]  . Cognitive impairment    Mild, Per incoming records from [facility]  . Constipation    Per incoming records from [facility]  . Dementia ([place])   . Diarrhea    Better off Aricept, Per incoming records from [facility]  . Diastolic dysfunction    on 2D Echo 07/2009 and 2016, Per incoming records from [facility]  . Diverticulosis    Mild, Per incoming records from [facility]  . Diverticulosis of colon (without mention of hemorrhage)   . ED (erectile dysfunction)  Per incoming records from [facility]  . History of Coumadin therapy    Per incoming records from [facility]  . History of echocardiogram 09/2014   Per incoming records from [facility]  . Hyperlipemia   . Hypertension   . Kidney mass    Per incoming records from [facility]  . Lower leg edema    Per incoming records from [facility]  . Melanoma ([place])   . Neuropathy    Per incoming records from [facility]  . OA (osteoarthritis)    Per incoming records from [facility]  . Peripheral neuropathy    Per incoming records from [facility]  . Personal history of colonic polyps 1999 & 2004   adenomatous polyps  . Pulmonary arterial hypertension ([place])    Per incoming records from [facility]  . Rosacea    Per incoming records from [facility]  . Thrombocytopenia ([place]) 2017   Per incoming records from [facility]  . UTI (urinary tract infection)    Sepsis, Per incoming records from [facility]  . Ventricular hypertrophy   . Walker as ambulation aid    Per incoming records from [facility]   Past Surgical History:  Procedure Laterality Date  . APPENDECTOMY     Per incoming records from [facility]  . CATARACT EXTRACTION     Per incoming records from [facility]  . IR CATHETER TUBE CHANGE  11/21/2019  . KNEE SURGERY     right  . PILONIDAL CYST DRAINAGE    . POLYPECTOMY     Per incoming records from [facility]  . schwanoma tumor lumbar spine    . SPINE SURGERY     tumor removed  . THORACIC 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, Per incoming records from [facility]  . TONSILLECTOMY AND ADENOIDECTOMY      Allergies  Allergen Reactions  . Penicillins Rash    Has patient had a PCN reaction causing immediate rash, facial/tongue/throat swelling, SOB or lightheadedness with hypotension: unknown Has patient had a PCN reaction causing severe rash involving mucus membranes or skin necrosis: unknown Has patient had a PCN reaction that required hospitalization : unknown Has patient had a PCN reaction occurring within the last 10 years: no If all of the above answers are "NO", then may proceed with Cephalosporin use.     Outpatient Encounter Medications as of 12/07/2020   Medication Sig  . acetaminophen (TYLENOL) 500 MG tablet Take 500 mg by mouth 2 (two) times daily. Scheduled and every 4 hours as needed (ending 05/05/2019)Document area of discomfort and effectiveness of medication.  Marland Kitchen atorvastatin (LIPITOR) 20 MG tablet Take 20 mg by mouth every evening.  . cholecalciferol (VITAMIN D3) 25 MCG (1000 UT) tablet Take 2,000 Units by mouth daily.   . DULoxetine (CYMBALTA) 20 MG capsule Take 20 mg by mouth daily.  . methenamine (MANDELAMINE) 1 g tablet Take 1,000 mg by mouth 2 (two) times daily.  . mineral oil-hydrophilic petrolatum (AQUAPHOR) ointment Apply topically daily. Right cheek and scalp  . polyethylene glycol (MIRALAX / GLYCOLAX) 17 g packet Take 17 g by mouth daily.  . silver nitrate applicators 50-53 % applicator Apply 1 application topically 3 (three) times a week. Monday, Wednesday, and Friday for wound care until resolved  . triamcinolone lotion (KENALOG) 0.1 % Apply 1 application topically 2 (two) times daily  as needed.   . warfarin (COUMADIN) 5 MG tablet Take 5 mg by mouth daily.   No facility-administered encounter medications on file as of 12/07/2020.    Review of Systems  Constitutional: Negative for activity change, appetite change, chills, diaphoresis, fatigue, fever and unexpected weight change.  Respiratory: Negative for cough, shortness of breath, wheezing and stridor.   Cardiovascular: Positive for leg swelling. Negative for chest pain and palpitations.  Gastrointestinal: Negative for abdominal distention, abdominal pain, constipation and diarrhea.  Genitourinary: Negative for decreased urine volume, difficulty urinating, dysuria, flank pain, frequency, hematuria and urgency.  Musculoskeletal: Positive for gait problem (uses walker). Negative for arthralgias, back pain, joint swelling and myalgias.  Neurological: Negative for dizziness, seizures, syncope, facial asymmetry, speech difficulty, weakness and headaches.  Hematological: Negative  for adenopathy. Does not bruise/bleed easily.  Psychiatric/Behavioral: Negative for agitation, behavioral problems and confusion (at baseline).    Immunization History  Administered Date(s) Administered  . Influenza, High Dose Seasonal PF 05/04/2020  . Influenza-Unspecified 04/17/2014, 05/01/2016, 04/15/2019  . Moderna SARS-COV2 Booster Vaccination 05/17/2020  . Moderna Sars-Covid-2 Vaccination 07/12/2019, 08/09/2019  . Pneumococcal Conjugate-13 04/20/2014  . Pneumococcal Polysaccharide-23 07/08/2003, 03/31/2011  . Pneumococcal-Unspecified 01/26/2004  . Td 08/23/2007  . Tdap 01/07/2012  . Zoster Recombinat (Shingrix) 10/05/2017, 12/09/2017  . Zoster, Live 07/17/2005   Pertinent  Health Maintenance Due  Topic Date Due  . INFLUENZA VACCINE  02/04/2021  . PNA vac Low Risk Adult  Completed   Fall Risk  09/12/2020 10/30/2019 06/23/2019 05/07/2019 05/03/2019  Falls in the past year? 0 - 1 - -  Number falls in past yr: 0 - 1 - -  Injury with Fall? 0 - 1 - -  Risk for fall due to : - History of fall(s);Impaired balance/gait;Impaired mobility;Mental status change History of fall(s);Impaired balance/gait;Impaired mobility History of fall(s);Impaired balance/gait;Impaired mobility;Mental status change;Medication side effect History of fall(s);Impaired balance/gait;Impaired mobility;Medication side effect;Mental status change  Follow up - Falls evaluation completed;Education provided;Falls prevention discussed Falls evaluation completed Falls evaluation completed;Education provided;Falls prevention discussed Falls evaluation completed;Education provided;Falls prevention discussed  Comment - - - [facility] all addressed at [place]:    Vitals:   12/07/20 0930  BP: 136/80  Pulse: 79  Resp: 16  Temp: 97.6 F (36.4 C)  SpO2: 93%  Weight: 267 lb (121.1 kg)  Height: 6\' 7"  (2.007 m)   Body mass index is 30.08 kg/m. Physical Exam Vitals and nursing note  reviewed.  Constitutional:      General: He is not in acute distress.    Appearance: He is not diaphoretic.  HENT:     Head: Normocephalic and atraumatic.     Mouth/Throat:     Mouth: Mucous membranes are moist.     Pharynx: Oropharynx is clear. No oropharyngeal exudate.  Eyes:     Conjunctiva/sclera: Conjunctivae normal.     Pupils: Pupils are equal, round, and reactive to light.  Neck:     Thyroid: No thyromegaly.     Vascular: No JVD.     Trachea: No tracheal deviation.  Cardiovascular:     Rate and Rhythm: Normal rate. Rhythm irregular.     Heart sounds: No murmur heard.   Pulmonary:     Effort: Pulmonary effort is normal. No respiratory distress.     Breath sounds: Normal breath sounds. No wheezing.  Abdominal:     General: Bowel sounds are normal. There is no distension.     Palpations: Abdomen is soft.  Tenderness: There is no abdominal tenderness. There is no right CVA tenderness or left CVA tenderness.  Genitourinary:    Comments: Clear yellow urine draining from s/p cath. Site has a small granuloma with a small amt of yellow drainage noted at the s/p site. No tenderness. Musculoskeletal:     Right lower leg: Edema (+1) present.     Left lower leg: Edema (+1) present.  Lymphadenopathy:     Cervical: No cervical adenopathy.  Skin:    General: Skin is warm and dry.  Neurological:     General: No focal deficit present.     Mental Status: He is alert. Mental status is at baseline.     Cranial Nerves: No cranial nerve deficit.  Psychiatric:        Mood and Affect: Mood normal.     Labs reviewed: Recent Labs    03/02/20 0000  NA 139  K 3.9  CL 105  CO2 23*  BUN 18  CREATININE 0.8  CALCIUM 10.0   No results for input(s): AST, ALT, ALKPHOS, BILITOT, PROT, ALBUMIN in the last 8760 hours. Recent Labs    03/02/20 0000 03/05/20 0000 08/09/20 0000  WBC 5.0 3.2  3.2 3.4  HGB 15.0 14.4  14.4 14.2  HCT 45 43 42  PLT 93* 113*  113* 80*   Lab Results   Component Value Date   TSH 0.84 05/19/2019   Lab Results  Component Value Date   HGBA1C 6 08/22/2019   Lab Results  Component Value Date   CHOL 129 08/09/2020   HDL 32 (A) 08/09/2020   LDLCALC 80 08/09/2020   TRIG 86 08/09/2020    Significant Diagnostic Results in last 30 days:  No results found.  Assessment/Plan  1. Vascular dementia without behavioral disturbance ([place]) He is at his baseline at this time with normal vitals and no symptoms. WIll check labs as he is due for them anyway. He is having his urine checked frequently due to confusion which could lead to over treatment of bacteria in the urine and resistant organisms. His pattern of increased confusion in the evening is common in the elderly with dementia. We will continue to monitor him and if symptoms develop we can check a UA but at this time he is at baseline and does not meet criteria for a UA>   2. Urinary retention due to benign prostatic hyperplasia Flush cath with NS 30 cc to ensure proper drainage.  Urine is clear and yellow at this time Continue methenamine per urology for UTI prevention    Labs/tests ordered:  CBC BMP

## 2020-12-08 ENCOUNTER — Encounter: Payer: Self-pay | Admitting: Adult Health

## 2020-12-17 DIAGNOSIS — Z7901 Long term (current) use of anticoagulants: Secondary | ICD-10-CM | POA: Diagnosis not present

## 2020-12-17 LAB — PROTIME-INR: INR: 2.4 — AB (ref 0.9–1.1)

## 2020-12-28 ENCOUNTER — Encounter: Payer: Self-pay | Admitting: Adult Health

## 2020-12-28 ENCOUNTER — Non-Acute Institutional Stay (SKILLED_NURSING_FACILITY): Payer: Medicare Other | Admitting: Adult Health

## 2020-12-28 DIAGNOSIS — I4811 Longstanding persistent atrial fibrillation: Secondary | ICD-10-CM | POA: Diagnosis not present

## 2020-12-28 DIAGNOSIS — N2889 Other specified disorders of kidney and ureter: Secondary | ICD-10-CM | POA: Diagnosis not present

## 2020-12-28 DIAGNOSIS — R338 Other retention of urine: Secondary | ICD-10-CM | POA: Diagnosis not present

## 2020-12-28 DIAGNOSIS — Z9359 Other cystostomy status: Secondary | ICD-10-CM

## 2020-12-28 DIAGNOSIS — Z7901 Long term (current) use of anticoagulants: Secondary | ICD-10-CM

## 2020-12-28 DIAGNOSIS — N401 Enlarged prostate with lower urinary tract symptoms: Secondary | ICD-10-CM | POA: Diagnosis not present

## 2020-12-28 DIAGNOSIS — D696 Thrombocytopenia, unspecified: Secondary | ICD-10-CM | POA: Diagnosis not present

## 2020-12-28 NOTE — Progress Notes (Signed)
Location:  Occupational psychologist of Service:  SNF (31) Provider:   Cindi Carbon, [place] [phone]   [name], MD  [name], [name] [name], MD as PCP - Cardiology (Cardiology)  Extended Emergency Contact Information Primary Emergency Contact: Gagner,[name] Address: [address]          [place], [place] [address] [name] of [place] Phone: [phone] Mobile Phone: [phone] Relation: Spouse Secondary Emergency Contact: Minshall,Saliou Address: [place] [address] [name] of [place] Phone: [phone] Mobile Phone: [phone] Relation: Son  Code Status:  DNR Goals of care: Advanced Directive information Advanced Directives 12/07/2020  Does Patient Have a Medical Advance Directive? Yes  Type of Advance Directive Out of facility DNR (pink MOST or yellow form);Living will  Does patient want to make changes to medical advance directive? No - Patient declined  Copy of [place] in Chart? -  Pre-existing out of facility DNR order (yellow form or pink MOST form) Yellow form placed in chart (order not valid for inpatient use);Pink MOST form placed in chart (order not valid for inpatient use)     Chief Complaint  Patient presents with   Medical Management of Chronic Issues    HPI:  Pt is a 87 y.o. male seen today for medical management of chronic diseases.    He has a hx of low WBC and plt. Was seen by [name] in the past with only monitoring recommended.  Lab Results  Component Value Date   WBC 2.5 12/07/2020   Lab Results  Component Value Date   PLT 93 (A) 12/07/2020   No current issues with bleeding, bruising  Remains on coumadin for afib CVA risk reduction without s/e  Vascular dementia: remains ambulatory with a walker. Pleasant no behaviors  Has an s/p cath  draining well due to BPH with retention  Hx of renal mass on the right, monitored in the past by urology   Past Medical History:  Diagnosis Date   Adult failure to thrive    Per incoming records from [facility]   Allergic rhinitis    Per incoming records from [facility]   Atrial fibrillation [facility])    BPH (benign prostatic hyperplasia)    Per incoming records from [place] of kidney [facility])    Right, Per incoming records from [place]    Per incoming records from [facility]   Cerebrovascular accident, late effects    Per incoming records from [place] kidney disease, stage III (moderate) ([place])    Per incoming records from [facility])    Per incoming records from [facility]   Cognitive impairment    Mild, Per incoming records from [facility]   Constipation    Per incoming records from [facility]   Dementia [facility])    Diarrhea    Better off Aricept, Per incoming records from [facility]   Diastolic dysfunction    on 2D Echo 07/2009 and 2016, Per incoming records from [facility]   Diverticulosis    Mild, Per incoming records from [facility]   Diverticulosis of colon (without mention of hemorrhage)    ED (  erectile dysfunction)    Per incoming records from [facility]   History of Coumadin therapy    Per incoming records from [facility]   History of echocardiogram 09/2014   Per incoming records from [facility]   Hyperlipemia    Hypertension    Kidney mass    Per incoming records from [place] leg edema    Per incoming records from [facility])    Neuropathy    Per incoming records from [facility]   OA (osteoarthritis)    Per incoming records from [facility]   Peripheral neuropathy    Per incoming records from [place] history of colonic [place] & 2004   adenomatous polyps   Pulmonary arterial hypertension ([place])    Per incoming records from [place]    Per incoming records from [facility]   Thrombocytopenia [facility]) 2017   Per incoming records from [facility]   UTI (urinary tract infection)    Sepsis, Per incoming records from [place] as ambulation aid    Per incoming records from [facility]   Past Surgical History:  Procedure Laterality Date   APPENDECTOMY     Per incoming records from [place]     Per incoming records from [place]  11/21/2019   KNEE SURGERY     right   PILONIDAL CYST DRAINAGE     POLYPECTOMY     Per incoming records from [place] tumor lumbar spine     SPINE SURGERY     tumor removed   THORACIC 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, Per incoming records from [place]      Allergies  Allergen Reactions   Penicillins Rash    Has patient had a PCN reaction causing immediate rash, facial/tongue/throat swelling, SOB or lightheadedness with hypotension: unknown Has patient had a PCN reaction causing severe rash involving mucus membranes or skin necrosis: unknown Has patient had a PCN reaction that required hospitalization : unknown Has patient had a PCN reaction occurring within the last 10 years: no If all of the above answers are "NO", then may proceed with Cephalosporin use.     Outpatient Encounter Medications as of 12/28/2020  Medication Sig    acetaminophen (TYLENOL) 500 MG tablet Take 500 mg by mouth 2 (two) times daily. Scheduled and every 4 hours as needed (ending 05/05/2019)Document area of discomfort and effectiveness of medication.   atorvastatin (LIPITOR) 20 MG tablet Take 20 mg by mouth every evening.   cholecalciferol (VITAMIN D3) 25 MCG (1000 UT) tablet Take 2,000 Units by mouth daily.    DULoxetine (CYMBALTA) 20 MG capsule Take 20 mg by mouth daily.   methenamine (MANDELAMINE) 1 g tablet Take 1,000 mg by mouth 2 (two) times daily.   mineral oil-hydrophilic petrolatum (AQUAPHOR) ointment Apply topically daily. Right cheek and scalp   polyethylene glycol (MIRALAX / GLYCOLAX) 17 g packet Take 17 g by mouth daily.   silver nitrate applicators 21-30 % applicator Apply 1 application topically 3 (three) times a week. Monday, Wednesday, and Friday for wound care until resolved   triamcinolone lotion (KENALOG) 0.1 % Apply 1 application  topically 2 (two) times daily as needed.    warfarin (COUMADIN) 5 MG tablet Take 5 mg by mouth daily.   No facility-administered encounter medications on file as of 12/28/2020.    Review of Systems  Constitutional:  Negative for activity change, appetite change, chills, diaphoresis, fatigue, fever and unexpected weight change.  Respiratory:  Negative for cough, shortness of breath, wheezing and stridor.   Cardiovascular:  Positive for leg swelling. Negative for chest pain and palpitations.  Gastrointestinal:  Negative for abdominal distention, abdominal pain, constipation and diarrhea.  Genitourinary:  Negative for difficulty urinating and dysuria.  Musculoskeletal:  Positive for gait problem. Negative for arthralgias, back pain, joint swelling and myalgias.  Neurological:  Negative for dizziness, seizures, syncope, facial asymmetry, speech difficulty, weakness and headaches.  Hematological:  Negative for adenopathy. Does not bruise/bleed easily.  Psychiatric/Behavioral:  Positive for confusion.  Negative for agitation and behavioral problems.    Immunization History  Administered Date(s) Administered   Influenza, High Dose Seasonal PF 05/04/2020   Influenza-Unspecified 04/17/2014, 05/01/2016, 04/15/2019   Moderna SARS-COV2 Booster Vaccination 05/17/2020   Moderna Sars-Covid-2 Vaccination 07/12/2019, 08/09/2019   Pneumococcal Conjugate-13 04/20/2014   Pneumococcal Polysaccharide-23 07/08/2003, 03/31/2011   Pneumococcal-Unspecified 01/26/2004   Td 08/23/2007   Tdap 01/07/2012   Zoster Recombinat (Shingrix) 10/05/2017, 12/09/2017   Zoster, Live 07/17/2005   Pertinent  Health Maintenance Due  Topic Date Due   INFLUENZA VACCINE  02/04/2021   PNA vac Low Risk Adult  Completed   Fall Risk  09/12/2020 10/30/2019 06/23/2019 05/07/2019 05/03/2019  Falls in the past year? 0 - 1 - -  Number falls in past yr: 0 - 1 - -  Injury with Fall? 0 - 1 - -  Risk for fall due to : - History of fall(s);Impaired balance/gait;Impaired mobility;Mental status change History of fall(s);Impaired balance/gait;Impaired mobility History of fall(s);Impaired balance/gait;Impaired mobility;Mental status change;Medication side effect History of fall(s);Impaired balance/gait;Impaired mobility;Medication side effect;Mental status change  Follow up - Falls evaluation completed;Education provided;Falls prevention discussed Falls evaluation completed Falls evaluation completed;Education provided;Falls prevention discussed Falls evaluation completed;Education provided;Falls prevention discussed  Comment - - - [facility] all addressed at [place]:    Vitals:   12/28/20 1150  Weight: 267 lb (121.1 kg)   Body mass index is 30.08 kg/m. Wt Readings from Last 3 Encounters:  12/28/20 267 lb (121.1 kg)  12/07/20 267 lb (121.1 kg)  11/26/20 268 lb 6.4 oz (121.7 kg)    Physical Exam Vitals and nursing note reviewed.  Constitutional:      General: He is not in acute distress.     Appearance: He is not diaphoretic.  HENT:     Head: Normocephalic and atraumatic.     Mouth/Throat:     Mouth: Mucous membranes are moist.     Pharynx: Oropharynx is clear.  Eyes:     Conjunctiva/sclera: Conjunctivae normal.     Pupils: Pupils are equal, round, and reactive to light.  Neck:     Thyroid: No thyromegaly.     Vascular: No JVD.     Trachea: No tracheal deviation.  Cardiovascular:     Rate and Rhythm: Normal rate. Rhythm irregular.     Heart sounds: No murmur heard. Pulmonary:     Effort: Pulmonary effort is normal. No respiratory distress.     Breath sounds: Normal breath sounds. No wheezing.  Abdominal:     General: Bowel sounds are normal. There is no distension.     Palpations:  Abdomen is soft.     Tenderness: There is no abdominal tenderness.  Musculoskeletal:     Comments: Trace BLE edema   Lymphadenopathy:     Cervical: No cervical adenopathy.  Skin:    General: Skin is warm and dry.  Neurological:     General: No focal deficit present.     Mental Status: He is alert. Mental status is at baseline.  Psychiatric:        Mood and Affect: Mood normal.    Labs reviewed: Recent Labs    03/02/20 0000 12/07/20 0000  NA 139 141  K 3.9 4.3  CL 105 105  CO2 23* 26*  BUN 18 18  CREATININE 0.8 1.2  CALCIUM 10.0 10.2   No results for input(s): AST, ALT, ALKPHOS, BILITOT, PROT, ALBUMIN in the last 8760 hours. Recent Labs    03/05/20 0000 08/09/20 0000 12/07/20 0000  WBC 3.2  3.2 3.4 2.5  HGB 14.4  14.4 14.2 13.5  HCT 43 42 40*  PLT 113*  113* 80* 93*   Lab Results  Component Value Date   TSH 0.84 05/19/2019   Lab Results  Component Value Date   HGBA1C 6 08/22/2019   Lab Results  Component Value Date   CHOL 129 08/09/2020   HDL 32 (A) 08/09/2020   LDLCALC 80 08/09/2020   TRIG 86 08/09/2020    Significant Diagnostic Results in last 30 days:  No results found.  Assessment/Plan 1. Urinary retention due to benign prostatic  hyperplasia S/p cath in place with no current issues Needs f/u with urology   2. Suprapubic catheter [facility]) [facility]   3. Renal mass, right CT 06/16/18 revealed right upper pole renal mass measuring 3.8 x 2.2 cm with no adenopathy or renal vein involvement.  Needs f/u with urology ordered  Due to his age, goals of care, and dementia continue monitoring may not be warranted.   4. Thrombocytopenia ([place]) Lab Results  Component Value Date   PLT 93 (A) 12/07/2020  Will recheck with next INR 7/11 He has not seen [name] since the pandemic but only monitoring was recommended at that time Differential included MDS vs due to alcoholism  5. Longstanding persistent atrial fibrillation (HCC) Rate is controlled without meds Continue on coumadin for CVA risk reduction   6. Chronic anticoagulation INR monthly  Coumadin at 5 mg qd  INR due 7/11    Family/ staff Communication: nurse, tried to call his wife but she was OOT  Labs/tests ordered:  CBC and INR 7/11

## 2021-01-14 DIAGNOSIS — D696 Thrombocytopenia, unspecified: Secondary | ICD-10-CM | POA: Diagnosis not present

## 2021-01-14 DIAGNOSIS — Z7901 Long term (current) use of anticoagulants: Secondary | ICD-10-CM | POA: Diagnosis not present

## 2021-01-14 LAB — POCT INR: INR: 2.7 — AB (ref 0.9–1.1)

## 2021-01-14 LAB — CBC: RBC: 4.61 (ref 3.87–5.11)

## 2021-01-14 LAB — CBC AND DIFFERENTIAL
HCT: 41 (ref 41–53)
Hemoglobin: 13.5 (ref 13.5–17.5)
Platelets: 96 — AB (ref 150–399)
WBC: 2.9

## 2021-01-24 ENCOUNTER — Encounter: Payer: Self-pay | Admitting: Adult Health

## 2021-01-24 ENCOUNTER — Non-Acute Institutional Stay (SKILLED_NURSING_FACILITY): Payer: Medicare Other | Admitting: Adult Health

## 2021-01-24 DIAGNOSIS — N401 Enlarged prostate with lower urinary tract symptoms: Secondary | ICD-10-CM | POA: Diagnosis not present

## 2021-01-24 DIAGNOSIS — R338 Other retention of urine: Secondary | ICD-10-CM | POA: Diagnosis not present

## 2021-01-24 DIAGNOSIS — D696 Thrombocytopenia, unspecified: Secondary | ICD-10-CM | POA: Diagnosis not present

## 2021-01-24 DIAGNOSIS — F0150 Vascular dementia without behavioral disturbance: Secondary | ICD-10-CM

## 2021-01-24 DIAGNOSIS — L929 Granulomatous disorder of the skin and subcutaneous tissue, unspecified: Secondary | ICD-10-CM | POA: Diagnosis not present

## 2021-01-24 DIAGNOSIS — D72819 Decreased white blood cell count, unspecified: Secondary | ICD-10-CM | POA: Diagnosis not present

## 2021-01-24 DIAGNOSIS — I4811 Longstanding persistent atrial fibrillation: Secondary | ICD-10-CM

## 2021-01-24 NOTE — Progress Notes (Signed)
Location:  [name] Room Number: 142-A Place of Service:  SNF [phone]) Provider:  [name], NP  [name], [name] [name], MD as PCP - Cardiology (Cardiology)  Extended Emergency Contact Information Primary Emergency Contact: [name],[name] Address: [address]          [place], [place] [address] [name] of [place] Phone: [phone] Mobile Phone: [phone] Relation: Spouse Secondary Emergency Contact: [name],[name] Address: [place] [address] [name] of [place] Phone: [phone] Mobile Phone: [phone] Relation: Son  Code Status:  DNR Goals of care: Advanced Directive information Advanced Directives 01/24/2021  Does Patient Have a Medical Advance Directive? Yes  Type of Paramedic of [place];Living will;Out of facility DNR (pink MOST or yellow form)  Does patient want to make changes to medical advance directive? No - Patient declined  Copy of [place] in Chart? Yes - validated most recent copy scanned in chart (See row information)  Pre-existing out of facility DNR order (yellow form or pink MOST form) Yellow form placed in chart (order not valid for inpatient use);Pink MOST form placed in chart (order not valid for inpatient use)     Chief Complaint  Patient presents with   Medical Management of Chronic Issues    Routine visit and discuss need covid #4 or exclude     HPI:  Pt is a 87 y.o. male seen today for medical management of chronic diseases.   The nurse asked that I review his s/p cath site which has been treated with silver nitrate sticks for hypergranulation tissue. The area has dried up with less drainage. His cath is draining well without bladder pain or fever.  He has moderate vascular dementia but remains ambulatory with a walker. Pleasant but  social inappropriateness at times.  WBC and plt have been trending down. ?MDS prior eval with [name] recommended monitoring only.  No issues with bruising or bleeding.  Edema in both feet unchanged. No pain or discomfort  Afib rate controlled without meds, currently on coumadin for CVA risk reduction  Past Medical History:  Diagnosis Date   Adult failure to thrive    Per incoming records from [facility]   Allergic rhinitis    Per incoming records from [facility]   Atrial fibrillation [facility])    BPH (benign prostatic hyperplasia)    Per incoming records from [place] of kidney [facility])    Right, Per incoming records from [place]    Per incoming records from [facility]   Cerebrovascular accident, late effects    Per incoming records from [place] kidney disease, stage III (moderate) ([place])    Per incoming records from [facility])    Per incoming records from [facility]   Cognitive impairment    Mild, Per incoming records from [facility]   Constipation    Per incoming records from [facility]   Dementia [facility])    Diarrhea    Better off Aricept, Per incoming records from [facility]   Diastolic dysfunction    on 2D Echo 07/2009 and 2016, Per incoming records from [facility]   Diverticulosis    Mild, Per incoming records  from [facility]   Diverticulosis of colon (without mention of hemorrhage)    ED (erectile dysfunction)    Per incoming records from [facility]   History of Coumadin therapy    Per incoming records from [facility]   History of echocardiogram 09/2014   Per incoming records from [facility]   Hyperlipemia    Hypertension    Kidney mass    Per  incoming records from [place] leg edema    Per incoming records from [facility])    Neuropathy    Per incoming records from [facility]   OA (osteoarthritis)    Per incoming records from [facility]   Peripheral neuropathy    Per incoming records from [facility] history of colonic [place] & 2004   adenomatous polyps   Pulmonary arterial hypertension ([place])    Per incoming records from [place]    Per incoming records from [facility]   Thrombocytopenia [facility]) 2017   Per incoming records from [facility]   UTI (urinary tract infection)    Sepsis, Per incoming records from [place] hypertrophy    Gilford Rile as ambulation aid    Per incoming records from [facility]   Past Surgical History:  Procedure Laterality Date   APPENDECTOMY     Per incoming records from [place]     Per incoming records from [facility]   IR [place]  11/21/2019   KNEE SURGERY     right   PILONIDAL CYST DRAINAGE     POLYPECTOMY     Per incoming records from [facility] tumor lumbar spine     SPINE SURGERY     tumor removed   THORACIC 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     Outpatient Encounter Medications as of 01/24/2021  Medication Sig   acetaminophen (TYLENOL) 500 MG tablet Take 500 mg by mouth 2 (two) times daily. Scheduled and every 4 hours as needed (ending 05/05/2019)Document area of discomfort and effectiveness of medication.   atorvastatin (LIPITOR) 20 MG tablet Take 20 mg by mouth every evening.   cholecalciferol (VITAMIN D3) 25 MCG (1000 UT) tablet Take 2,000 Units by mouth daily.    DULoxetine (CYMBALTA) 20 MG capsule Take 20 mg by mouth daily.   methenamine (MANDELAMINE) 1 g tablet Take 1,000 mg by mouth 2 (two) times daily.   mineral oil-hydrophilic petrolatum (AQUAPHOR) ointment Apply topically daily. Right cheek and scalp   polyethylene glycol (MIRALAX / GLYCOLAX) 17 g packet Take 17 g by mouth daily.   silver nitrate applicators 82-50 % applicator Apply 1 application topically 3 (three) times a week. Monday, Wednesday,  and Friday for wound care until resolved   triamcinolone lotion (KENALOG) 0.1 % Apply 1 application topically 2 (two) times daily as needed.    warfarin (COUMADIN) 5 MG tablet Take 5 mg by mouth daily.   No facility-administered encounter medications on file as of 01/24/2021.    Review of Systems  Constitutional:  Negative for activity change, appetite change, chills, diaphoresis, fatigue, fever and unexpected weight change.  HENT:  Negative for congestion.   Eyes:  Negative for visual disturbance.  Respiratory:  Negative for cough, shortness of breath, wheezing and stridor.   Cardiovascular:  Positive for leg swelling. Negative for chest pain and palpitations.  Gastrointestinal:  Negative for abdominal distention, abdominal pain, constipation and diarrhea.  Genitourinary:  Negative for difficulty urinating and dysuria.  Musculoskeletal:  Positive for gait problem. Negative for arthralgias, back pain, joint  swelling and myalgias.  Skin:  Negative for wound.  Neurological:  Negative for dizziness, seizures, syncope, facial asymmetry, speech difficulty, weakness and headaches.  Hematological:  Negative for adenopathy. Does not bruise/bleed easily.  Psychiatric/Behavioral:  Positive for confusion. Negative for agitation and behavioral problems.    Immunization History  Administered Date(s) Administered   Influenza, High Dose Seasonal PF 05/04/2020   Influenza-Unspecified 04/17/2014, 05/01/2016, 04/15/2019   Moderna SARS-COV2 Booster Vaccination 05/17/2020   Moderna Sars-Covid-2 Vaccination 07/12/2019, 08/09/2019   Pneumococcal Conjugate-13 04/20/2014   Pneumococcal Polysaccharide-23 07/08/2003, 03/31/2011   Pneumococcal-Unspecified 01/26/2004   Td 08/23/2007   Tdap 01/07/2012   Zoster Recombinat (Shingrix) 10/05/2017, 12/09/2017   Zoster, Live 07/17/2005   Pertinent  Health Maintenance Due  Topic Date Due   INFLUENZA VACCINE  02/04/2021   PNA vac Low Risk Adult  Completed   Fall Risk  09/12/2020 10/30/2019 06/23/2019 05/07/2019 05/03/2019  Falls in the past year? 0 - 1 - -  Number falls in past yr: 0 - 1 - -  Injury with Fall? 0 - 1 - -  Risk for fall due to : - History of fall(s);Impaired balance/gait;Impaired mobility;Mental status change History of fall(s);Impaired balance/gait;Impaired mobility History of fall(s);Impaired balance/gait;Impaired mobility;Mental status change;Medication side effect History of fall(s);Impaired balance/gait;Impaired mobility;Medication side effect;Mental status change  Follow up - Falls evaluation completed;Education provided;Falls prevention discussed Falls evaluation completed Falls evaluation completed;Education provided;Falls prevention discussed Falls evaluation completed;Education provided;Falls prevention discussed  Comment - - - done at [facility] all addressed at [place]:    Vitals:   01/24/21 1230  BP: 124/68   Pulse: (!) 51  Resp: 20  Temp: (!) 97.5 F (36.4 C)  SpO2: 93%  Weight: 267 lb (121.1 kg)  Height: 6\' 7"  (2.007 m)   Body mass index is 30.08 kg/m. Wt Readings from Last 3 Encounters:  01/24/21 267 lb (121.1 kg)  12/28/20 267 lb (121.1 kg)  12/07/20 267 lb (121.1 kg)    Physical Exam Vitals and nursing note reviewed.  Constitutional:      General: He is not in acute distress.    Appearance: He is not diaphoretic.  HENT:     Head: Normocephalic and atraumatic.     Mouth/Throat:     Mouth: Mucous membranes are moist.     Pharynx: Oropharynx is clear.  Neck:     Thyroid: No thyromegaly.     Vascular: No JVD.     Trachea: No tracheal deviation.  Cardiovascular:     Rate and Rhythm: Normal rate. Rhythm irregular.     Heart sounds: No murmur heard. Pulmonary:  Effort: Pulmonary effort is normal. No respiratory distress.     Breath sounds: Normal breath sounds. No wheezing.  Abdominal:     General: Bowel sounds are normal. There is no distension.     Palpations: Abdomen is soft.     Tenderness: There is no abdominal tenderness.  Musculoskeletal:     Right lower leg: Edema (+1) present.     Left lower leg: Edema (+1) present.  Lymphadenopathy:     Cervical: No cervical adenopathy.  Skin:    General: Skin is warm and dry.     Comments: S/p site without drainage or erythema. Hypergranulation present to s/p site which is dry   Neurological:     General: No focal deficit present.     Mental Status: He is alert. Mental status is at baseline.     Cranial Nerves: No cranial nerve deficit.  Psychiatric:        Mood and Affect: Mood normal.    Labs reviewed: Recent Labs    03/02/20 0000 12/07/20 0000  NA 139 141  K 3.9 4.3  CL 105 105  CO2 23* 26*  BUN 18 18  CREATININE 0.8 1.2  CALCIUM 10.0 10.2   No results for input(s): AST, ALT, ALKPHOS, BILITOT, PROT, ALBUMIN in the last 8760 hours. Recent Labs    08/09/20 0000 12/07/20 0000 01/14/21 0000  WBC 3.4  2.5 2.9  HGB 14.2 13.5 13.5  HCT 42 40* 41  PLT 80* 93* 96*   Lab Results  Component Value Date   TSH 0.84 05/19/2019   Lab Results  Component Value Date   HGBA1C 6 08/22/2019   Lab Results  Component Value Date   CHOL 129 08/09/2020   HDL 32 (A) 08/09/2020   LDLCALC 80 08/09/2020   TRIG 86 08/09/2020    Significant Diagnostic Results in last 30 days:  No results found.  Assessment/Plan  1. Thrombocytopenia (HCC) Trending down Lab Results  Component Value Date   PLT 96 (A) 01/14/2021  No current issues with bruising or bleeding ?MDS F/U with [name] recommended if ok with his wife    2. Leukopenia, unspecified type Lab Results  Component Value Date   WBC 2.9 01/14/2021  Trending down See above    3. Vascular dementia without behavioral disturbance (HCC) Moderate Progressive decline in cognition and physical function c/w the disease. Continue supportive care in the skilled environment.  4. Longstanding persistent atrial fibrillation (HCC) Rate controlled  Continue coumadin for CVA risk reduction     CHA2DS2-VASc Score = 5  This indicates a 7.2% annual risk of stroke. The patient's score is based upon: CHF History: Yes HTN History: Yes Diabetes History: No Stroke History: No Vascular Disease History: Yes      5. Urinary retention due to benign prostatic hyperplasia F/U with urology apt in place for retention and renal mass (size monitoring by their office)  6. Hypergranulation D/c silver nitrate somewhat effective Area has dried but persists    Family/ staff Communication: left message for his wife to discuss f/u with [name]  Labs/tests ordered:  INR monitoring monthly

## 2021-01-25 ENCOUNTER — Telehealth: Payer: Self-pay

## 2021-01-25 NOTE — Telephone Encounter (Signed)
-----   Message from Wyatt Portela, MD sent at 01/25/2021 10:28 AM EDT ----- His labs are no different from his baseline which has fluctuated in the last 5 years.  I see nothing urgent at this time.  Thanks ----- Message ----- From: Burna Mortimer, CMA Sent: 01/25/2021  10:25 AM EDT To: Wyatt Portela, MD  [name],  Patient last seen by you on 03/22/19. An RN with [facility] called stating that [name], NP recommends that the patient follow up with you regarding his most recent lab work. Ms. Melvyn Novas states that the patient's WBC count and platelets are out of range and would like for you to take a look. Most recent labs are in his chart from 01/14/21. Please advise.  Thanks, Myriam Jacobson

## 2021-01-25 NOTE — Telephone Encounter (Signed)
I called Janet Berlin, RN with [facility] to advise her of Dr. Hazeline Junker advice below. Megan verbalized understanding and confirmed that she would inform [name], NP.

## 2021-02-11 ENCOUNTER — Encounter: Payer: Self-pay | Admitting: Internal Medicine

## 2021-02-11 DIAGNOSIS — Z7901 Long term (current) use of anticoagulants: Secondary | ICD-10-CM | POA: Diagnosis not present

## 2021-02-11 LAB — PROTIME-INR: INR: 2.8 — AB (ref 0.9–1.1)

## 2021-02-15 ENCOUNTER — Encounter: Payer: Self-pay | Admitting: Adult Health

## 2021-02-15 ENCOUNTER — Non-Acute Institutional Stay (SKILLED_NURSING_FACILITY): Payer: Medicare Other | Admitting: Adult Health

## 2021-02-15 DIAGNOSIS — I4811 Longstanding persistent atrial fibrillation: Secondary | ICD-10-CM | POA: Diagnosis not present

## 2021-02-15 DIAGNOSIS — N401 Enlarged prostate with lower urinary tract symptoms: Secondary | ICD-10-CM

## 2021-02-15 DIAGNOSIS — I5032 Chronic diastolic (congestive) heart failure: Secondary | ICD-10-CM

## 2021-02-15 DIAGNOSIS — R338 Other retention of urine: Secondary | ICD-10-CM | POA: Diagnosis not present

## 2021-02-15 DIAGNOSIS — F0150 Vascular dementia without behavioral disturbance: Secondary | ICD-10-CM | POA: Diagnosis not present

## 2021-02-15 DIAGNOSIS — I10 Essential (primary) hypertension: Secondary | ICD-10-CM | POA: Diagnosis not present

## 2021-02-15 DIAGNOSIS — D696 Thrombocytopenia, unspecified: Secondary | ICD-10-CM | POA: Diagnosis not present

## 2021-02-15 NOTE — Progress Notes (Addendum)
Location:    [facility] Room Number: 142 Place of Service:  SNF [phone]) Provider:  [name], NP  [name], MD  [name], [name] [name], MD as PCP - Cardiology (Cardiology)  Extended Emergency Contact Information Primary Emergency Contact: Kreager,[name] Address: [address]          [place], [place] [address] [name] of [place] Phone: [phone] Mobile Phone: [phone] Relation: Spouse Secondary Emergency Contact: Broady,Rodderick Address: [place] [address] [name] of [place] Phone: [phone] Mobile Phone: [phone] Relation: Son  Code Status:  DNR Goals of care: Advanced Directive information Advanced Directives 02/15/2021  Does Patient Have a Medical Advance Directive? Yes  Type of Paramedic of [place];Living will;Out of facility DNR (pink MOST or yellow form)  Does patient want to make changes to medical advance directive? No - Patient declined  Copy of [place] in Chart? Yes - validated most recent copy scanned in chart (See row information)  Pre-existing out of facility DNR order (yellow form or pink MOST form) Yellow form placed in chart (order not valid for inpatient use);Pink MOST form placed in chart (order not valid for inpatient use)     Chief Complaint  Patient presents with   Medical Management of Chronic Issues   Quality Metric Gaps    #4 Covid vaccination, flu shot    HPI:  Pt is a 87 y.o. male seen today for medical management of chronic diseases.    Vascular dementia: continues to ambulate with a walker, feeds self, and participates in activities. Very pleasantly forgetful.   Afib: rate controlled without meds No issues with sob, edema, cp, or palpitations Weight stable INR therapeutic  Continues with s/p cath due to hx of  BPH with retention NO bladder pain or fever  Hx of thrombocytopenia and leukopenia ?MDS  Recommended f/u with [name] but when the nurse faxed over his labs he said he did not need to come back, just to continue to monitor.   BPs 154/80 156/77 149/77   Past Medical History:  Diagnosis Date   Adult failure to thrive    Per incoming records from [facility]   Allergic rhinitis    Per incoming records from [place] fibrillation [facility])    BPH (benign prostatic hyperplasia)    Per incoming records from [place] of kidney [facility])    Right, Per incoming records from [place]    Per incoming records from [facility]   Cerebrovascular accident, late effects    Per incoming records from [place] kidney disease, stage III (moderate) ([place])    Per incoming records from [facility])    Per incoming records from [facility]   Cognitive impairment    Mild, Per incoming records from [facility]   Constipation    Per [facility] records from [facility]   Dementia [facility])    Diarrhea    Better off Aricept, Per incoming records from [facility]   Diastolic dysfunction    on 2D Echo 07/2009 and 2016, Per incoming records from [facility]   Diverticulosis    Mild, Per incoming records from  [facility]   Diverticulosis of colon (without mention of hemorrhage)    ED (erectile dysfunction)    Per [facility] records [facility]   History of Coumadin therapy    Per incoming records from [facility]   History of echocardiogram 09/2014   Per incoming records from [facility]   Hyperlipemia    Hypertension    Kidney mass    Per incoming records from [place] leg edema    Per incoming records from [facility])    Neuropathy    Per incoming records from [facility]   OA (osteoarthritis)    Per incoming records from [facility]   Peripheral neuropathy    Per incoming records from [place] history of colonic [place] & 2004   adenomatous polyps   Pulmonary arterial hypertension ([place])    Per incoming records from [place]    Per incoming records from [facility]   Thrombocytopenia [facility]) 2017   Per incoming records from [facility]   UTI (urinary tract infection)    Sepsis, Per incoming records from [place] hypertrophy    Gilford Rile as ambulation aid    Per incoming records from [facility]   Past Surgical History:  Procedure Laterality Date   APPENDECTOMY     Per incoming records from [place]     Per incoming records from [facility]   IR [place]  11/21/2019   KNEE SURGERY     right   PILONIDAL CYST DRAINAGE     POLYPECTOMY     Per incoming records from [place] tumor lumbar spine     SPINE SURGERY     tumor removed   THORACIC 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     Allergies as of 02/15/2021       Reactions   Penicillins Rash   Has patient had a PCN reaction causing immediate rash, facial/tongue/throat swelling, SOB or lightheadedness with hypotension: unknown Has patient had a PCN reaction causing severe rash involving mucus membranes or skin necrosis: unknown Has patient had a PCN reaction that required hospitalization : unknown Has patient had a PCN reaction occurring within the last 10 years: no If all of the above answers are "NO", then may proceed with Cephalosporin use.        Medication List        Accurate as of February 15, 2021 11:24 AM. If you have any questions, ask your nurse or doctor.          acetaminophen 500 MG tablet Commonly known as: TYLENOL Take 500 mg by mouth 2 (two) times daily. Scheduled and every  4 hours as needed (ending 05/05/2019)Document area of discomfort and effectiveness of medication.   atorvastatin 20 MG tablet Commonly known as: LIPITOR Take 20 mg by mouth every evening.   cholecalciferol 25 MCG (1000 UNIT) tablet Commonly known as: VITAMIN D3 Take 2,000 Units by mouth daily.   DULoxetine 20 MG capsule Commonly known as: CYMBALTA Take 20 mg by mouth daily.   ketoconazole 2 % shampoo Commonly known as: NIZORAL Apply 1 application topically 2 (two) times a week. Once A Morning on Tue, Thu   methenamine 1 g tablet Commonly known as: MANDELAMINE Take 1,000 mg by mouth 2 (two) times daily.   mineral oil-hydrophilic petrolatum ointment Apply topically daily. Right cheek and scalp   polyethylene glycol 17 g packet Commonly known as: MIRALAX / GLYCOLAX Take 17 g by mouth daily.   triamcinolone lotion 0.1 % Commonly known as: KENALOG Apply 1 application topically 2 (two) times daily as needed.   warfarin 5 MG tablet Commonly known as: COUMADIN Take 5 mg by mouth daily.        Review of Systems  Constitutional:   Negative for activity change, appetite change, chills, diaphoresis, fatigue, fever and unexpected weight change.  Respiratory:  Negative for cough, shortness of breath, wheezing and stridor.   Cardiovascular:  Positive for leg swelling. Negative for chest pain and palpitations.  Gastrointestinal:  Negative for abdominal distention, abdominal pain, constipation and diarrhea.  Genitourinary:  Negative for difficulty urinating and dysuria.  Musculoskeletal:  Positive for gait problem. Negative for arthralgias, back pain, joint swelling and myalgias.  Neurological:  Negative for dizziness, seizures, syncope, facial asymmetry, speech difficulty, weakness and headaches.  Hematological:  Negative for adenopathy. Does not bruise/bleed easily.  Psychiatric/Behavioral:  Positive for confusion. Negative for agitation and behavioral problems.    Immunization History  Administered Date(s) Administered   Influenza, High Dose Seasonal PF 05/04/2020   Influenza-Unspecified 04/17/2014, 05/01/2016, 04/15/2019   Moderna SARS-COV2 Booster Vaccination 05/17/2020   Moderna Sars-Covid-2 Vaccination 07/12/2019, 08/09/2019   Pneumococcal Conjugate-13 04/20/2014   Pneumococcal Polysaccharide-23 07/08/2003, 03/31/2011   Pneumococcal-Unspecified 01/26/2004   Td 08/23/2007   Tdap 01/07/2012   Zoster Recombinat (Shingrix) 10/05/2017, 12/09/2017   Zoster, Live 07/17/2005   Pertinent  Health Maintenance Due  Topic Date Due   INFLUENZA VACCINE  02/04/2021   PNA vac Low Risk Adult  Completed   Fall Risk  09/12/2020 10/30/2019 06/23/2019 05/07/2019 05/03/2019  Falls in the past year? 0 - 1 - -  Number falls in past yr: 0 - 1 - -  Injury with Fall? 0 - 1 - -  Risk for fall due to : - History of fall(s);Impaired balance/gait;Impaired mobility;Mental status change History of fall(s);Impaired balance/gait;Impaired mobility History of fall(s);Impaired balance/gait;Impaired mobility;Mental status change;Medication side effect  History of fall(s);Impaired balance/gait;Impaired mobility;Medication side effect;Mental status change  Follow up - Falls evaluation completed;Education provided;Falls prevention discussed Falls evaluation completed Falls evaluation completed;Education provided;Falls prevention discussed Falls evaluation completed;Education provided;Falls prevention discussed  Comment - - [facility] at [place]:    Vitals:   02/15/21 1115  BP: (!) 154/80  Pulse: 60  Resp: 18  Temp: 97.7 F (36.5 C)  SpO2: 95%  Weight: 264 lb 1.6 oz (119.8 kg)  Height: '6\' 7"'$  (2.007 m)   Body mass index is 29.75 kg/m. Physical Exam Vitals and nursing note reviewed.  Constitutional:      General: He is not in acute distress.    Appearance: He  is not diaphoretic.  HENT:     Head: Normocephalic and atraumatic.  Neck:     Thyroid: No thyromegaly.     Vascular: No JVD.     Trachea: No tracheal deviation.  Cardiovascular:     Rate and Rhythm: Normal rate. Rhythm irregular.     Heart sounds: No murmur heard. Pulmonary:     Effort: Pulmonary effort is normal. No respiratory distress.     Breath sounds: Normal breath sounds. No wheezing.  Abdominal:     General: Bowel sounds are normal. There is no distension.     Palpations: Abdomen is soft.     Tenderness: There is no abdominal tenderness.  Musculoskeletal:     Right lower leg: Edema present.     Left lower leg: Edema present.  Lymphadenopathy:     Cervical: No cervical adenopathy.  Skin:    General: Skin is warm and dry.  Neurological:     Mental Status: He is alert. Mental status is at baseline.  Psychiatric:        Mood and Affect: Mood normal.    Labs reviewed: Recent Labs    03/02/20 0000 12/07/20 0000  NA 139 141  K 3.9 4.3  CL 105 105  CO2 23* 26*  BUN 18 18  CREATININE 0.8 1.2  CALCIUM 10.0 10.2   No results for input(s): AST, ALT, ALKPHOS, BILITOT, PROT, ALBUMIN in the last 8760  hours. Recent Labs    08/09/20 0000 12/07/20 0000 01/14/21 0000  WBC 3.4 2.5 2.9  HGB 14.2 13.5 13.5  HCT 42 40* 41  PLT 80* 93* 96*   Lab Results  Component Value Date   TSH 0.84 05/19/2019   Lab Results  Component Value Date   HGBA1C 6 08/22/2019   Lab Results  Component Value Date   CHOL 129 08/09/2020   HDL 32 (A) 08/09/2020   LDLCALC 80 08/09/2020   TRIG 86 08/09/2020    Significant Diagnostic Results in last 30 days:  No results found.  Assessment/Plan  1. Chronic diastolic heart failure (HCC) Compensated Has chronic mild edema Not currently on diuretics.   2. Longstanding persistent atrial fibrillation ([place]) Rate is controlled Continues on coumadin for CVA risk reduction  3. Essential hypertension BP in the 150s but due to fall risk would not treat unless trending upward  4. Vascular dementia without behavioral disturbance (HCC) Moderate Progressive decline in cognition and physical function c/w the disease. Continue supportive care in the skilled environment.  5. Urinary retention due to benign prostatic hyperplasia S/p suprapubic cath No current issues Takes methenamine for UTI prevention   6. Thrombocytopenia ([place]) Lab Results  Component Value Date   PLT 96 (A) 01/14/2021   Continue to monitor.    Family/ staff Communication: nurse  Labs/tests ordered:  INR monthly

## 2021-03-11 ENCOUNTER — Encounter: Payer: Self-pay | Admitting: Internal Medicine

## 2021-03-11 DIAGNOSIS — Z7901 Long term (current) use of anticoagulants: Secondary | ICD-10-CM | POA: Diagnosis not present

## 2021-03-11 LAB — PROTIME-INR: INR: 3.3 — AB (ref 0.9–1.1)

## 2021-03-12 ENCOUNTER — Non-Acute Institutional Stay (SKILLED_NURSING_FACILITY): Payer: Medicare Other | Admitting: Orthopedic Surgery

## 2021-03-12 DIAGNOSIS — J069 Acute upper respiratory infection, unspecified: Secondary | ICD-10-CM

## 2021-03-12 DIAGNOSIS — U071 COVID-19: Secondary | ICD-10-CM

## 2021-03-12 DIAGNOSIS — R490 Dysphonia: Secondary | ICD-10-CM | POA: Diagnosis not present

## 2021-03-12 NOTE — Progress Notes (Signed)
Location:  [facility] Room Number: 142 Place of Service:  SNF [phone]) Provider:  DNR  [name], MD  [name], [name] [name], MD as PCP - Cardiology (Cardiology)  Extended Emergency Contact Information Primary Emergency Contact: Giorgio,[name] Address: [address]          [place], [place] [address] [name] of [place] Phone: [phone] Mobile Phone: [phone] Relation: Spouse Secondary Emergency Contact: Oldfield,Nitin Address: [place] [address] [name] of [place] Phone: [phone] Mobile Phone: [phone] Relation: Son  Code Status:  DNR Goals of care: Advanced Directive information Advanced Directives 02/15/2021  Does Patient Have a Medical Advance Directive? Yes  Type of Paramedic of [place];Living will;Out of facility DNR (pink MOST or yellow form)  Does patient want to make changes to medical advance directive? No - Patient declined  Copy of [place] in Chart? Yes - validated most recent copy scanned in chart (See row information)  Pre-existing out of facility DNR order (yellow form or pink MOST form) Yellow form placed in chart (order not valid for inpatient use);Pink MOST form placed in chart (order not valid for inpatient use)     Chief Complaint  Patient presents with   Acute Visit    Productive cough, hoarseness    HPI:  Pt is a 87 y.o. male seen today for acute visit due to productive cough and hoarseness.   08/22 he tested positive for covid-19. 08/23 he was started on paxlovid due to symptoms of increased weakness, poor appetite, and dry cough. He responded well to treatment and was taken off isolation precautions  09/02. Today, nursing reports productive cough and hoarseness. Afebrile. He is pleasant today, but reports feeling unwell and  tired.  Denies chest pain and sob.     Past Medical History:  Diagnosis Date   Adult failure to thrive    Per incoming records [facility]   Allergic rhinitis    [facility]   Atrial fibrillation [facility])    BPH (benign prostatic hyperplasia)    Per incoming records from [place] of kidney [facility])    Right, Per incoming records from [place]    Per [facility] from [facility]   Cerebrovascular accident, late effects    Per incoming records from [place] kidney disease, stage III (moderate) ([place])    Per incoming records from [place] [facility])    Per incoming records from [facility]   Cognitive impairment    Mild, Per incoming records from [facility]   Constipation    Per incoming records from [facility]   [facility]ementia [facility])    Diarrhea    Better off Aricept, Per incoming records from [facility]   Diastolic dysfunction    on 2D Echo 07/2009 and 2016, Per incoming records from [facility]   Diverticulosis    Mild, Per incoming records from [facility]   Diverticulosis of colon (without mention of hemorrhage)    ED (erectile dysfunction)    Per incoming records from [facility]   History of Coumadin therapy    [facility]  History of echocardiogram 09/2014   Per incoming records from [facility]   Hyperlipemia    Hypertension    Kidney mass    Per incoming records from [place] leg edema    Per incoming records from [facility])    Neuropathy    Per incoming records from [place] (osteoarthritis)    Per incoming records from  [facility]   Peripheral neuropathy    Per incoming records from [place] history of colonic [place] & 2004   adenomatous polyps   Pulmonary arterial hypertension ([place])    Per incoming records from [place]    Per incoming records from [facility]   Thrombocytopenia [facility]) 2017   Per incoming records from [facility]   UTI (urinary tract infection)    Sepsis, Per incoming records from [place] as ambulation aid    Per incoming records from [facility]   Past Surgical History:  Procedure Laterality Date   APPENDECTOMY     Per incoming records from [place]     Per incoming records from [place]  11/21/2019   KNEE SURGERY     right   PILONIDAL CYST DRAINAGE     POLYPECTOMY     Per incoming records from [place] tumor lumbar spine     SPINE SURGERY     tumor removed   THORACIC 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, Per incoming records from [place]      Allergies  Allergen Reactions   Penicillins Rash    Has patient had a PCN reaction causing immediate rash, facial/tongue/throat swelling, SOB or lightheadedness with hypotension: unknown Has patient had a PCN reaction causing severe rash involving mucus membranes or skin necrosis: unknown Has patient had a PCN reaction that required hospitalization : unknown Has patient had a PCN reaction occurring within the last 10 years: no If all of the above answers are "NO", then may proceed with Cephalosporin use.     Outpatient Encounter Medications as of 03/12/2021  Medication Sig   acetaminophen (TYLENOL) 500 MG tablet Take 500 mg by mouth 2 (two) times daily.  Scheduled and every 4 hours as needed (ending 05/05/2019)Document area of discomfort and effectiveness of medication.   atorvastatin (LIPITOR) 20 MG tablet Take 20 mg by mouth every evening.   cholecalciferol (VITAMIN D3) 25 MCG (1000 UT) tablet Take 2,000 Units by mouth daily.    DULoxetine (CYMBALTA) 20 MG capsule Take 20 mg by mouth daily.   ketoconazole (NIZORAL) 2 % shampoo Apply 1 application topically 2 (two) times a week. Once A Morning on Tue, Thu   methenamine (MANDELAMINE) 1 g tablet Take 1,000 mg by mouth 2 (two) times daily.   mineral oil-hydrophilic petrolatum (AQUAPHOR) ointment Apply topically daily. Right cheek and scalp   polyethylene glycol (MIRALAX / GLYCOLAX) 17 g packet Take 17 g by mouth daily.   triamcinolone lotion (KENALOG) 0.1 % Apply 1 application topically 2 (two) times daily as needed.    warfarin (COUMADIN) 5 MG tablet Take 5 mg by mouth daily.   No facility-administered encounter medications on file as of 03/12/2021.  Review of Systems  Constitutional:  Positive for fatigue. Negative for activity change, appetite change, chills and fever.  HENT:  Positive for congestion and voice change. Negative for sinus pressure, sinus pain, sneezing and sore throat.   Respiratory:  Positive for cough. Negative for shortness of breath and wheezing.   Cardiovascular:  Negative for chest pain.  Musculoskeletal:  Negative for myalgias.  Psychiatric/Behavioral:  Positive for confusion. Negative for dysphoric mood and sleep disturbance. The patient is not nervous/anxious.    Immunization History  Administered Date(s) Administered   Influenza, High Dose Seasonal PF 05/04/2020   Influenza-Unspecified 04/17/2014, 05/01/2016, 04/15/2019   Moderna SARS-COV2 Booster Vaccination 05/17/2020   Moderna Sars-Covid-2 Vaccination 07/12/2019, 08/09/2019   Pneumococcal Conjugate-13 04/20/2014   Pneumococcal Polysaccharide-23 07/08/2003, 03/31/2011   Pneumococcal-Unspecified 01/26/2004    Td 08/23/2007   Tdap 01/07/2012   Zoster Recombinat (Shingrix) 10/05/2017, 12/09/2017   Zoster, Live 07/17/2005   Pertinent  Health Maintenance Due  Topic Date Due   INFLUENZA VACCINE  02/04/2021   PNA vac Low Risk Adult  Completed   Fall Risk  09/12/2020 10/30/2019 06/23/2019 05/07/2019 05/03/2019  Falls in the past year? 0 - 1 - -  Number falls in past yr: 0 - 1 - -  Injury with Fall? 0 - 1 - -  Risk for fall due to : - History of fall(s);Impaired balance/gait;Impaired mobility;Mental status change History of fall(s);Impaired balance/gait;Impaired mobility History of fall(s);Impaired balance/gait;Impaired mobility;Mental status change;Medication side effect History of fall(s);Impaired balance/gait;Impaired mobility;Medication side effect;Mental status change  Follow up - Falls evaluation completed;Education provided;Falls prevention discussed Falls evaluation completed Falls evaluation completed;Education provided;Falls prevention discussed Falls evaluation completed;Education provided;Falls prevention discussed  Comment - - [facility] at [place]:    Vitals:   03/12/21 1636  BP: 137/72  Pulse: 78  Resp: 18  Temp: (!) 97.2 F (36.2 C)  SpO2: 96%  Weight: 250 lb (113.4 kg)  Height: '6\' 7"'$  (2.007 m)   Body mass index is 28.16 kg/m. Physical Exam Vitals reviewed.  Constitutional:      General: He is not in acute distress.    Comments: Voice raspy  HENT:     Head: Normocephalic.     Right Ear: Tympanic membrane normal.     Left Ear: Tympanic membrane normal.     Nose: Congestion and rhinorrhea present.     Mouth/Throat:     Mouth: Mucous membranes are moist.     Pharynx: No oropharyngeal exudate or posterior oropharyngeal erythema.  Eyes:     General:        Right eye: No discharge.        Left eye: No discharge.  Cardiovascular:     Rate and Rhythm: Normal rate. Rhythm irregular.     Pulses: Normal pulses.      Heart sounds: Normal heart sounds. No murmur heard. Pulmonary:     Effort: Pulmonary effort is normal. No respiratory distress.     Breath sounds: Examination of the right-upper field reveals rhonchi. Examination of the left-upper field reveals rhonchi. Rhonchi present.     Comments: Sputum tan  Lymphadenopathy:     Cervical: No cervical adenopathy.  Skin:    General: Skin is warm and dry.     Capillary Refill: Capillary refill takes less than 2 seconds.  Neurological:     General: No focal deficit present.     Mental Status: He is alert. Mental status is at baseline.  Motor: Weakness present.     Gait: Gait abnormal.  Psychiatric:        Mood and Affect: Mood normal.        Behavior: Behavior normal.        Cognition and Memory: Memory is impaired.    Labs reviewed: Recent Labs    12/07/20 0000  NA 141  K 4.3  CL 105  CO2 26*  BUN 18  CREATININE 1.2  CALCIUM 10.2   No results for input(s): AST, ALT, ALKPHOS, BILITOT, PROT, ALBUMIN in the last 8760 hours. Recent Labs    08/09/20 0000 12/07/20 0000 01/14/21 0000  WBC 3.4 2.5 2.9  HGB 14.2 13.5 13.5  HCT 42 40* 41  PLT 80* 93* 96*   Lab Results  Component Value Date   TSH 0.84 05/19/2019   Lab Results  Component Value Date   HGBA1C 6 08/22/2019   Lab Results  Component Value Date   CHOL 129 08/09/2020   HDL 32 (A) 08/09/2020   LDLCALC 80 08/09/2020   TRIG 86 08/09/2020    Significant Diagnostic Results in last 30 days:  No results found.  Assessment/Plan: 1. COVID-19 - tested + 08/22 - paxlovid completed 08/23- 08/27 - symptoms include weakness, poor appetite, dry cough  2. Upper respiratory infection with cough and congestion - rhonchi noted in upper lung fields, lower bases clear - doxycycline 100 mg po bid x 7 days - guaifenesin 600 mg po bid x 7 days - encourage fluids - vitamin C 1000 mg po daily x 7 days  3. Hoarseness - suspect related to cough - recommend warm fluids and encourage  hydration    Family/ staff Communication: plan discussed with patient and nurse  Labs/tests ordered:  none

## 2021-03-13 ENCOUNTER — Encounter: Payer: Self-pay | Admitting: Orthopedic Surgery

## 2021-03-13 MED ORDER — VITAMIN C 1000 MG PO TABS: 1000.0000 mg | ORAL_TABLET | Freq: Every day | ORAL | 0 refills | Status: DC

## 2021-03-13 MED ORDER — DOXYCYCLINE HYCLATE 100 MG PO TABS: 100.0000 mg | ORAL_TABLET | Freq: Two times a day (BID) | ORAL | 0 refills | Status: DC

## 2021-03-13 MED ORDER — GUAIFENESIN 200 MG PO TABS: 600.0000 mg | ORAL_TABLET | Freq: Two times a day (BID) | ORAL | 0 refills | Status: DC

## 2021-03-14 ENCOUNTER — Non-Acute Institutional Stay (SKILLED_NURSING_FACILITY): Payer: Medicare Other | Admitting: Internal Medicine

## 2021-03-14 ENCOUNTER — Encounter: Payer: Self-pay | Admitting: Internal Medicine

## 2021-03-14 DIAGNOSIS — J069 Acute upper respiratory infection, unspecified: Secondary | ICD-10-CM

## 2021-03-14 DIAGNOSIS — Z7901 Long term (current) use of anticoagulants: Secondary | ICD-10-CM | POA: Diagnosis not present

## 2021-03-14 DIAGNOSIS — F0150 Vascular dementia without behavioral disturbance: Secondary | ICD-10-CM

## 2021-03-14 DIAGNOSIS — I10 Essential (primary) hypertension: Secondary | ICD-10-CM

## 2021-03-14 DIAGNOSIS — N401 Enlarged prostate with lower urinary tract symptoms: Secondary | ICD-10-CM | POA: Diagnosis not present

## 2021-03-14 DIAGNOSIS — U071 COVID-19: Secondary | ICD-10-CM

## 2021-03-14 DIAGNOSIS — D696 Thrombocytopenia, unspecified: Secondary | ICD-10-CM | POA: Diagnosis not present

## 2021-03-14 DIAGNOSIS — R338 Other retention of urine: Secondary | ICD-10-CM | POA: Diagnosis not present

## 2021-03-14 DIAGNOSIS — I4811 Longstanding persistent atrial fibrillation: Secondary | ICD-10-CM | POA: Diagnosis not present

## 2021-03-14 DIAGNOSIS — I5032 Chronic diastolic (congestive) heart failure: Secondary | ICD-10-CM | POA: Diagnosis not present

## 2021-03-14 DIAGNOSIS — R5383 Other fatigue: Secondary | ICD-10-CM | POA: Diagnosis not present

## 2021-03-14 LAB — PROTIME-INR

## 2021-03-14 LAB — BASIC METABOLIC PANEL
BUN: 20 (ref 4–21)
CO2: 24 — AB (ref 13–22)
Chloride: 106 (ref 99–108)
Creatinine: 1 (ref 0.6–1.3)
Glucose: 139
Potassium: 4 (ref 3.4–5.3)
Sodium: 138 (ref 137–147)

## 2021-03-14 LAB — HEPATIC FUNCTION PANEL
ALT: 10 (ref 10–40)
AST: 12 — AB (ref 14–40)
Alkaline Phosphatase: 94 (ref 25–125)
Bilirubin, Total: 1

## 2021-03-14 LAB — CBC AND DIFFERENTIAL
HCT: 39 — AB (ref 41–53)
Hemoglobin: 13.1 — AB (ref 13.5–17.5)
Platelets: 113 — AB (ref 150–399)
WBC: 2.3

## 2021-03-14 LAB — COMPREHENSIVE METABOLIC PANEL
Albumin: 3.3 — AB (ref 3.5–5.0)
Calcium: 9.8 (ref 8.7–10.7)

## 2021-03-14 LAB — CBC: RBC: 4.46 (ref 3.87–5.11)

## 2021-03-14 NOTE — Progress Notes (Addendum)
Location:   [place] Room Number: [place] of Service:    Provider:  Veleta Miners MD  [name], MD  [name], [name] [name], MD as PCP - Cardiology (Cardiology)  Extended Emergency Contact Information Primary Emergency Contact: Olejniczak,[name] Address: [address]          [place],  [address] [name] of [place] Phone: [phone] Mobile Phone: [phone] Relation: Spouse Secondary Emergency Contact: Mamula,German Address: [place] [address] [name] of [place] Phone: [phone] Mobile Phone: [phone] Relation: Son  Code Status:  DNR Goals of care: Advanced Directive information Advanced Directives 03/14/2021  Does Patient Have a Medical Advance Directive? Yes  Type of Paramedic of [place];Living will;Out of facility DNR (pink MOST or yellow form)  Does patient want to make changes to medical advance directive? No - Patient declined  Copy of [place] in Chart? Yes - validated most recent copy scanned in chart (See row information)  Pre-existing out of facility DNR order (yellow form or pink MOST form) Yellow form placed in chart (order not valid for inpatient use);Pink MOST form placed in chart (order not valid for inpatient use)     Chief Complaint  Patient presents with   Acute Visit    Lethargy    HPI:  Pt is a 87 y.o. male seen today for an acute visit for Lethargy  Has h/o A Fi, Chronic Diastolic CHF, HLD  Thrombocytopenia, Has seen Dr Alen Blew in 03/2018 Told to follow  Right Renal Mass seen in [place] in [place] H/o Urinary Retension s/p Suprapubic Cathter H/o Vascular Dementia MRI in 2020 Cortical Atrophy and Microvascular disease  Patient recently had Covid on 8/22 and was treated with Paxlovid Was seen by Amy yesterday for Productive cough  and was started on Doxycyline Today Nurses report that he is more lethargic No Fever POX good range Appetite is poor Sleeping most of the time    Past Medical History:  Diagnosis Date   Adult failure to thrive    Per incoming records from [facility]   Allergic rhinitis    Per incoming records from [facility]   Atrial fibrillation [facility])    BPH (benign prostatic hyperplasia)    Per incoming records from [place] of kidney [facility])    Right, Per incoming records from [place]    Per incoming records from [facility]   Cerebrovascular accident, late effects    Per incoming records from [place]   Chronic kidney disease, stage III (moderate) ([place])    Per incoming records from [facility])    Per incoming records from [facility]   Cognitive impairment    Mild, Per incoming records from [facility]   Constipation    Per incoming records from [facility]   Dementia [facility])    Diarrhea    Better off Aricept, Per incoming records from [facility]   Diastolic dysfunction    on 2D Echo 07/2009 and 2016, Per incoming records from [facility]   Diverticulosis    Mild, Per incoming records from [facility]   Diverticulosis of colon (without mention of hemorrhage)    ED (erectile  dysfunction)    Per incoming records from [facility]   History of Coumadin therapy    Per incoming records from [facility]   History of echocardiogram 09/2014   Per incoming records from [facility]   Hyperlipemia    Hypertension    Kidney mass    Per incoming records from [place] leg edema    Per incoming records from [facility])    Neuropathy     Per incoming records from [place] (osteoarthritis)    Per incoming records from [facility]   Peripheral neuropathy    Per incoming records from [place] history of colonic [place] & 2004   adenomatous polyps   Pulmonary arterial hypertension ([place])    Per incoming records from [place]    Per incoming records from [facility]   Thrombocytopenia [facility]) 2017   Per incoming records from [facility]   UTI (urinary tract infection)    Sepsis, Per incoming records from [place] as ambulation aid    Per incoming records from [facility]   Past Surgical History:  Procedure Laterality Date   APPENDECTOMY     Per incoming records from [place]     Per incoming records from [place]  11/21/2019   KNEE SURGERY     right   PILONIDAL CYST DRAINAGE     POLYPECTOMY     Per incoming records from [place] tumor lumbar spine     SPINE SURGERY     tumor removed   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     Allergies as of 03/14/2021        Reactions   Penicillins Rash   Has patient had a PCN reaction causing immediate rash, facial/tongue/throat swelling, SOB or lightheadedness with hypotension: unknown Has patient had a PCN reaction causing severe rash involving mucus membranes or skin necrosis: unknown Has patient had a PCN reaction that required hospitalization : unknown Has patient had a PCN reaction occurring within the last 10 years: no If all of the above answers are "NO", then may proceed with Cephalosporin use.        Medication List        Accurate as of March 14, 2021  4:38 PM. If you have any questions, ask your nurse or doctor.          STOP taking these medications    warfarin 5 MG tablet Commonly known as: COUMADIN Stopped by: [name], MD       TAKE these medications    acetaminophen 500 MG  tablet Commonly known as: TYLENOL Take 500 mg by mouth 2 (two) times daily. Scheduled and every 4 hours as needed (ending 05/05/2019)Document area of discomfort and effectiveness of medication.   atorvastatin 20 MG tablet Commonly known as: LIPITOR Take 20 mg by mouth every evening.   cholecalciferol 25 MCG (1000 UNIT) tablet Commonly known as: VITAMIN D3 Take 2,000 Units by mouth daily.   doxycycline 100 MG tablet Commonly known as: VIBRA-TABS Take 100 mg by mouth 2 (two) times daily. What changed: Another medication with the same name was removed. Continue taking this medication, and follow the directions you see here. Changed by: [name], MD   DULoxetine 20 MG capsule Commonly known as: CYMBALTA Take 20 mg by mouth daily.   guaiFENesin 200 MG tablet Take 3 tablets (600 mg total) by mouth in the morning and at bedtime.   ketoconazole 2 % shampoo Commonly known as: NIZORAL Apply 1 application topically 2 (two) times a week. Once A Morning on Tue, Thu   LORazepam 2 MG/ML concentrated solution Commonly known as: ATIVAN Take 0.6 mg by mouth every 6 (six) hours as needed for  anxiety.   methenamine 1 g tablet Commonly known as: MANDELAMINE Take 1,000 mg by mouth 2 (two) times daily.   mineral oil-hydrophilic petrolatum ointment Apply topically daily. Right cheek and scalp   morphine 20 MG/ML concentrated solution Commonly known as: ROXANOL Take by mouth every 4 (four) hours as needed for severe pain.   polyethylene glycol 17 g packet Commonly known as: MIRALAX / GLYCOLAX Take 17 g by mouth daily.   triamcinolone lotion 0.1 % Commonly known as: KENALOG Apply 1 application topically 2 (two) times daily as needed.   vitamin C 1000 MG tablet Take 1 tablet (1,000 mg total) by mouth daily.        Review of Systems  Constitutional:  Positive for activity change, appetite change and unexpected weight change.  HENT: Negative.    Respiratory:  Positive for cough.   Cardiovascular:  Positive for leg swelling.  Gastrointestinal: Negative.   Genitourinary: Negative.   Musculoskeletal:  Positive for gait problem.  Skin: Negative.   Neurological:  Positive for weakness.  Psychiatric/Behavioral:  Positive for confusion.    Immunization History  Administered Date(s) Administered   Influenza, High Dose Seasonal PF 05/04/2020   Influenza-Unspecified 04/17/2014, 05/01/2016, 04/15/2019   Moderna SARS-COV2 Booster Vaccination 05/17/2020   Moderna Sars-Covid-2 Vaccination 07/12/2019, 08/09/2019   Pneumococcal Conjugate-13 04/20/2014   Pneumococcal Polysaccharide-23 07/08/2003, 03/31/2011   Pneumococcal-Unspecified 01/26/2004   Td 08/23/2007   Tdap 01/07/2012   Zoster Recombinat (Shingrix) 10/05/2017, 12/09/2017   Zoster, Live 07/17/2005   Pertinent  Health Maintenance Due  Topic Date Due   INFLUENZA VACCINE  02/04/2021   PNA vac Low Risk Adult  Completed   Fall Risk  09/12/2020 10/30/2019 06/23/2019 05/07/2019 05/03/2019  Falls in the past year? 0 - 1 - -  Number falls in past yr: 0 - 1 - -  Injury with Fall? 0 - 1 - -  Risk for fall due to : -  History of fall(s);Impaired balance/gait;Impaired mobility;Mental status change History of fall(s);Impaired balance/gait;Impaired mobility History of fall(s);Impaired balance/gait;Impaired mobility;Mental status change;Medication side effect History of fall(s);Impaired balance/gait;Impaired mobility;Medication side effect;Mental status change  Follow up - Falls evaluation completed;Education provided;Falls prevention discussed Falls evaluation completed Falls evaluation completed;Education provided;Falls prevention discussed Falls evaluation completed;Education provided;Falls prevention discussed  Comment - - - [facility] all addressed at [place]  Survey:    Vitals:   03/14/21 1608  BP: (!) 145/87  Pulse: 78  Resp: 18  Temp: (!) 97.5 F (36.4 C)  SpO2: 96%  Weight: 250 lb (113.4 kg)  Height: '6\' 7"'$  (2.007 m)   Body mass index is 28.16 kg/m. Physical Exam Vitals reviewed.  Constitutional:      Comments: Was sleepy but would respond and denied any discomfort  HENT:     Head: Normocephalic.     Nose: Nose normal.     Mouth/Throat:     Mouth: Mucous membranes are dry.  Eyes:     Pupils: Pupils are equal, round, and reactive to light.  Cardiovascular:     Rate and Rhythm: Normal rate. Rhythm irregular.     Pulses: Normal pulses.  Pulmonary:     Effort: Pulmonary effort is normal. No respiratory distress.     Breath sounds: Normal breath sounds. No wheezing or rales.  Abdominal:     General: Abdomen is flat. Bowel sounds are normal.     Palpations: Abdomen is soft.  Musculoskeletal:        General: Swelling present.     Cervical back: Neck supple.  Skin:    General: Skin is warm.  Neurological:     General: No focal deficit present.  Psychiatric:        Mood and Affect: Mood normal.        Thought Content: Thought content normal.    Labs reviewed: Recent Labs    12/07/20 0000  NA 141  K 4.3  CL 105  CO2 26*  BUN 18  CREATININE 1.2   CALCIUM 10.2   No results for input(s): AST, ALT, ALKPHOS, BILITOT, PROT, ALBUMIN in the last 8760 hours. Recent Labs    08/09/20 0000 12/07/20 0000 01/14/21 0000  WBC 3.4 2.5 2.9  HGB 14.2 13.5 13.5  HCT 42 40* 41  PLT 80* 93* 96*   Lab Results  Component Value Date   TSH 0.84 05/19/2019   Lab Results  Component Value Date   HGBA1C 6 08/22/2019   Lab Results  Component Value Date   CHOL 129 08/09/2020   HDL 32 (A) 08/09/2020   LDLCALC 80 08/09/2020   TRIG 86 08/09/2020    Significant Diagnostic Results in last 30 days:  No results found.  Assessment/Plan Lethargy Did respond well to me but has been more sleepy  Most Likely Post Covid Will get CBC and CMP to rule out any other infection or Dehydration Also Get Chest Xray Discontinue Roxanol and Ativan Addendum Labs WBC 2.3 HGB 13.1 BUN 20 creat1.0 COVID-19 Was treated with Paxlovid His symptoms seems due to Post  Covid Upper respiratory infection with cough and congestion On Doxycyline  Chronic diastolic heart failure (HCC) Ted hoses Longstanding persistent atrial fibrillation (HCC) Holding Coumadin as INR was more then 3 due to Doxycyline Restart in 2 days Essential hypertension No Meds Vascular dementia without behavioral disturbance (HCC) Supportive care On Coumadin and statin Urinary retention due to benign prostatic hyperplasia On Methamine NO Signs of Infection  Thrombocytopenia ([place]) ? Etiology 113 Today Has Seen Hematology before they recommended Surveillance and No further Work up [place] in 2018 did not show any splenomegaly  HLD On statin LDL 80 in 2/22 Depression On Cymbalta    Family/ staff Communication:   Labs/tests ordered:      Total time spent in this patient care encounter was  45_  minutes; greater than 50% of the visit spent  counseling patient and staff, reviewing records , Labs and coordinating care for problems addressed at this encounter.

## 2021-03-15 ENCOUNTER — Encounter: Payer: Self-pay | Admitting: Internal Medicine

## 2021-03-15 DIAGNOSIS — R0602 Shortness of breath: Secondary | ICD-10-CM | POA: Diagnosis not present

## 2021-03-18 ENCOUNTER — Non-Acute Institutional Stay (SKILLED_NURSING_FACILITY): Payer: Medicare Other | Admitting: Adult Health

## 2021-03-18 ENCOUNTER — Encounter: Payer: Self-pay | Admitting: Adult Health

## 2021-03-18 DIAGNOSIS — U071 COVID-19: Secondary | ICD-10-CM | POA: Diagnosis not present

## 2021-03-18 DIAGNOSIS — L989 Disorder of the skin and subcutaneous tissue, unspecified: Secondary | ICD-10-CM

## 2021-03-18 DIAGNOSIS — J069 Acute upper respiratory infection, unspecified: Secondary | ICD-10-CM | POA: Diagnosis not present

## 2021-03-18 NOTE — Progress Notes (Signed)
Location:  Occupational psychologist of Service:  SNF (31) Provider:   Cindi Carbon, [place] [phone]   [name], MD  [name], [name] [name], MD as PCP - Cardiology (Cardiology)  Extended Emergency Contact Information Primary Emergency Contact: Schoonmaker,[name] Address: [address]          [place], [place] [address] [name] of [place] Phone: [phone] Mobile Phone: [phone] Relation: Spouse Secondary Emergency Contact: Pedretti,Clarnce Address: [place] [address] [name] of [place] Phone: [phone] Mobile Phone: [phone] Relation: Son  Code Status:  DNR Goals of care: Advanced Directive information Advanced Directives 03/14/2021  Does Patient Have a Medical Advance Directive? Yes  Type of Paramedic of [place];Living will;Out of facility DNR (pink MOST or yellow form)  Does patient want to make changes to medical advance directive? No - Patient declined  Copy of [place] in Chart? Yes - validated most recent copy scanned in chart (See row information)  Pre-existing out of facility DNR order (yellow form or pink MOST form) Yellow form placed in chart (order not valid for inpatient use);Pink MOST form placed in chart (order not valid for inpatient use)     Chief Complaint  Patient presents with   Acute Visit    Skin lesion f/u covid    HPI:  Pt is a 87 y.o. male seen today for an acute visit for skin lesion of the left forearm present for several weeks now is raised. Not tender, itching or painful. NO drainage. No fever  He was treated for covid on 8/22 with paxlovid. Had cough, congestion and hoarseness and was started on Doxy, vit c, and mucinex 9/6.  CXR showed only chronic changes. Nurse reports that his cough is improved. He is not sob or  having a fever. Strength and alertness has improved as well.   Reviewed CBC and BMP from 9/8 no significant findings. Has chronic low plts and low WBC.  INR 3.0 on 9/8. Warfarin held for two days then restarted with recheck 9/19 Past Medical History:  Diagnosis Date   Adult failure to thrive    Per incoming records from [facility]   Allergic rhinitis    Per incoming records from [facility]   Atrial fibrillation [facility])    BPH (benign prostatic hyperplasia)    Per incoming records from [place] of kidney [facility])    Right, Per incoming records from [place]    Per incoming records from [facility]   Cerebrovascular accident, late effects    Per incoming records from [place]   Chronic kidney disease, stage III (moderate) ([place])    Per incoming records from [facility])    Per incoming records from [facility]   Cognitive impairment    Mild, Per incoming records from [facility]   Constipation    Per incoming records from [facility]   Dementia [facility])    Diarrhea    Better off Aricept, Per incoming records from [facility]   Diastolic dysfunction    on 2D Echo 07/2009 and 2016, Per incoming records from [facility]   Diverticulosis    Mild, Per incoming records  from [facility]   Diverticulosis of colon (without mention of hemorrhage)    ED (erectile dysfunction)    Per incoming records from [facility]   History of Coumadin therapy    Per incoming records from [facility]   History of echocardiogram 09/2014   Per incoming records from [facility]   Hyperlipemia    Hypertension    Kidney mass    Per incoming records from [address] leg edema    Per  incoming records from [place] [facility])    Neuropathy    Per incoming records from [facility]   OA (osteoarthritis)    Per incoming records from [facility]   Peripheral neuropathy    Per incoming records from [place] history of colonic [place] & 2004   adenomatous polyps   Pulmonary arterial hypertension ([place])    Per incoming records from [place]    Per incoming records from [facility]   Thrombocytopenia [facility]) 2017   Per incoming records from [facility]   UTI (urinary tract infection)    Sepsis, Per incoming records from [place] hypertrophy    Gilford Rile as ambulation aid    Per incoming records from [facility]   Past Surgical History:  Procedure Laterality Date   APPENDECTOMY     Per incoming records from [place]     Per incoming records from [facility]   IR [place]  11/21/2019   KNEE SURGERY     right   PILONIDAL CYST DRAINAGE     POLYPECTOMY     Per incoming records from [place] tumor lumbar spine     SPINE SURGERY     tumor removed   THORACIC 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, Per incoming records from [place]      Allergies  Allergen Reactions   Penicillins Rash    Has patient had a PCN reaction causing immediate rash, facial/tongue/throat swelling, SOB or lightheadedness with hypotension: unknown Has patient had a PCN reaction causing severe rash involving mucus membranes or skin necrosis: unknown Has patient had a PCN reaction that required hospitalization : unknown Has patient had a PCN reaction occurring within the last 10 years: no If all of the above answers are "NO", then  may proceed with Cephalosporin use.     Outpatient Encounter Medications as of 03/18/2021  Medication Sig   acetaminophen (TYLENOL) 500 MG tablet Take 500 mg by mouth 2 (two) times daily. Scheduled and every 4 hours as needed (ending 05/05/2019)Document area of discomfort and effectiveness of medication.   Ascorbic Acid (VITAMIN C) 1000 MG tablet Take 1 tablet (1,000 mg total) by mouth daily.   atorvastatin (LIPITOR) 20 MG tablet Take 20 mg by mouth every evening.   cholecalciferol (VITAMIN D3) 25 MCG (1000 UT) tablet Take 2,000 Units by mouth daily.    doxycycline (VIBRA-TABS) 100 MG tablet Take 100 mg by mouth 2 (two) times daily.   DULoxetine (CYMBALTA) 20 MG capsule Take 20 mg by mouth daily.   guaiFENesin 200 MG tablet Take 3 tablets (600 mg total) by mouth in the morning and at bedtime.   ketoconazole (NIZORAL) 2 % shampoo Apply 1 application topically  2 (two) times a week. Once A Morning on Tue, Thu   methenamine (MANDELAMINE) 1 g tablet Take 1,000 mg by mouth 2 (two) times daily.   mineral oil-hydrophilic petrolatum (AQUAPHOR) ointment Apply topically daily. Right cheek and scalp   polyethylene glycol (MIRALAX / GLYCOLAX) 17 g packet Take 17 g by mouth daily.   triamcinolone lotion (KENALOG) 0.1 % Apply 1 application topically 2 (two) times daily as needed.    warfarin (COUMADIN) 5 MG tablet Take 5 mg by mouth daily.   No facility-administered encounter medications on file as of 03/18/2021.    Review of Systems  Constitutional:  Negative for activity change, appetite change, chills, diaphoresis, fatigue, fever and unexpected weight change.  HENT:  Positive for congestion and voice change. Negative for ear discharge, ear pain, postnasal drip, rhinorrhea, sinus pain, sore throat and trouble swallowing.   Respiratory:  Negative for cough (improving), shortness of breath, wheezing and stridor.   Cardiovascular:  Positive for leg swelling. Negative for chest pain and palpitations.   Gastrointestinal:  Negative for abdominal distention, abdominal pain, constipation, diarrhea, nausea and vomiting.  Genitourinary:  Negative for difficulty urinating and dysuria.  Musculoskeletal:  Positive for gait problem. Negative for arthralgias, back pain, joint swelling and myalgias.  Skin:        Skin lesion left forearm  Neurological:  Positive for weakness (improving). Negative for dizziness, seizures, syncope, facial asymmetry, speech difficulty and headaches.  Hematological:  Negative for adenopathy. Does not bruise/bleed easily.  Psychiatric/Behavioral:  Positive for confusion. Negative for agitation and behavioral problems.    Immunization History  Administered Date(s) Administered   Influenza, High Dose Seasonal PF 05/04/2020   Influenza-Unspecified 04/17/2014, 05/01/2016, 04/15/2019   Moderna SARS-COV2 Booster Vaccination 05/17/2020   Moderna Sars-Covid-2 Vaccination 07/12/2019, 08/09/2019   Pneumococcal Conjugate-13 04/20/2014   Pneumococcal Polysaccharide-23 07/08/2003, 03/31/2011   Pneumococcal-Unspecified 01/26/2004   Td 08/23/2007   Tdap 01/07/2012   Zoster Recombinat (Shingrix) 10/05/2017, 12/09/2017   Zoster, Live 07/17/2005   Pertinent  Health Maintenance Due  Topic Date Due   INFLUENZA VACCINE  02/04/2021   PNA vac Low Risk Adult  Completed   Fall Risk  09/12/2020 10/30/2019 06/23/2019 05/07/2019 05/03/2019  Falls in the past year? 0 - 1 - -  Number falls in past yr: 0 - 1 - -  Injury with Fall? 0 - 1 - -  Risk for fall due to : - History of fall(s);Impaired balance/gait;Impaired mobility;Mental status change History of fall(s);Impaired balance/gait;Impaired mobility History of fall(s);Impaired balance/gait;Impaired mobility;Mental status change;Medication side effect History of fall(s);Impaired balance/gait;Impaired mobility;Medication side effect;Mental status change  Follow up - Falls evaluation completed;Education provided;Falls prevention discussed Falls  evaluation completed Falls evaluation completed;Education provided;Falls prevention discussed Falls evaluation completed;Education provided;Falls prevention discussed  Comment - - [facility] at [place]:    Vitals:   03/18/21 1052  BP: 128/76  Temp: (!) 97.3 F (36.3 C)   There is no height or weight on file to calculate BMI. Physical Exam Vitals and nursing note reviewed.  Constitutional:      General: He is not in acute distress.    Appearance: He is not diaphoretic.  HENT:     Head: Normocephalic and atraumatic.     Nose: Nose normal. No congestion.     Mouth/Throat:     Mouth: Mucous membranes are moist.     Pharynx: Oropharynx is clear.  Eyes:     General:  Right eye: No discharge.        Left eye: No discharge.     Conjunctiva/sclera: Conjunctivae normal.     Pupils: Pupils are equal, round, and reactive to light.  Neck:     Thyroid: No thyromegaly.     Vascular: No JVD.     Trachea: No tracheal deviation.  Cardiovascular:     Rate and Rhythm: Normal rate. Rhythm irregular.     Heart sounds: No murmur heard. Pulmonary:     Effort: Pulmonary effort is normal. No respiratory distress.     Breath sounds: Rhonchi (scattered) present. No wheezing.  Abdominal:     General: Bowel sounds are normal. There is no distension.     Palpations: Abdomen is soft.     Tenderness: There is no abdominal tenderness.  Musculoskeletal:     Comments: BLE +1  Lymphadenopathy:     Cervical: No cervical adenopathy.  Skin:    General: Skin is warm and dry.     Findings: Lesion (left anterior forearm with raised brown lesion with surrounding redness and flaky skin) present.  Neurological:     Mental Status: He is alert and oriented to person, place, and time.     Cranial Nerves: No cranial nerve deficit.  Psychiatric:        Mood and Affect: Mood normal.    Labs reviewed: Recent Labs    12/07/20 0000  NA 141  K 4.3   CL 105  CO2 26*  BUN 18  CREATININE 1.2  CALCIUM 10.2   No results for input(s): AST, ALT, ALKPHOS, BILITOT, PROT, ALBUMIN in the last 8760 hours. Recent Labs    08/09/20 0000 12/07/20 0000 01/14/21 0000  WBC 3.4 2.5 2.9  HGB 14.2 13.5 13.5  HCT 42 40* 41  PLT 80* 93* 96*   Lab Results  Component Value Date   TSH 0.84 05/19/2019   Lab Results  Component Value Date   HGBA1C 6 08/22/2019   Lab Results  Component Value Date   CHOL 129 08/09/2020   HDL 32 (A) 08/09/2020   LDLCALC 80 08/09/2020   TRIG 86 08/09/2020    Significant Diagnostic Results in last 30 days:  No results found.  Assessment/Plan 1. Skin lesion Warm compresses 15 min tid x 48 hrs ? If this is developing skin infection vs cancerous lesion.  May need derm referral if not improving and family is agrees  2. COVID-19 virus infection Improving s/p paxlovid Off isolation On RA  3. Upper respiratory infection with cough and congestion Continues with congested cough and hoarseness but is improving Continue Doxy to complete 7 day course.     Family/ staff Communication: nurse  Labs/tests ordered:  INR 9/19

## 2021-03-25 DIAGNOSIS — Z7901 Long term (current) use of anticoagulants: Secondary | ICD-10-CM | POA: Diagnosis not present

## 2021-03-27 ENCOUNTER — Encounter: Payer: Self-pay | Admitting: Internal Medicine

## 2021-03-27 DIAGNOSIS — D696 Thrombocytopenia, unspecified: Secondary | ICD-10-CM | POA: Diagnosis not present

## 2021-03-27 LAB — PROTIME-INR: INR: 2.8 — AB (ref 0.9–1.1)

## 2021-04-10 DIAGNOSIS — Z23 Encounter for immunization: Secondary | ICD-10-CM | POA: Diagnosis not present

## 2021-04-16 ENCOUNTER — Encounter: Payer: Self-pay | Admitting: Orthopedic Surgery

## 2021-04-16 ENCOUNTER — Non-Acute Institutional Stay (SKILLED_NURSING_FACILITY): Payer: Medicare Other | Admitting: Orthopedic Surgery

## 2021-04-16 DIAGNOSIS — N3281 Overactive bladder: Secondary | ICD-10-CM

## 2021-04-16 DIAGNOSIS — F0150 Vascular dementia without behavioral disturbance: Secondary | ICD-10-CM | POA: Diagnosis not present

## 2021-04-16 DIAGNOSIS — T83010D Breakdown (mechanical) of cystostomy catheter, subsequent encounter: Secondary | ICD-10-CM

## 2021-04-16 NOTE — Progress Notes (Signed)
Location:  [place] Room Number: 142 Place of Service:  SNF [phone]) Provider:  Windell Moulding, AGNP-C  [name], MD  [name], [name] [name], MD as PCP - Cardiology (Cardiology)  Extended Emergency Contact Information Primary Emergency Contact: [name],[name] Address: [address]          [place], [place] [address] [name] Phone: [phone] Mobile Phone: [phone] Relation: Spouse Secondary Emergency Contact: Tauzin,[name] Address: [place] [address] [name] Phone: [phone] Mobile Phone: [phone] Relation: Son  Code Status:  DNR Goals of care: Advanced Directive information Advanced Directives 03/14/2021  Does Patient Have a Medical Advance Directive? Yes  Type of Paramedic of [place];Living will;Out of facility DNR (pink MOST or yellow form)  Does patient want to make changes to medical advance directive? No - Patient declined  Copy of [place] in Chart? Yes - validated most recent copy scanned in chart (See row information)  Pre-existing out of facility DNR order (yellow form or pink MOST form) Yellow form placed in chart (order not valid for inpatient use);Pink MOST form placed in chart (order not valid for inpatient use)     Chief Complaint  Patient presents with   Acute Visit    Increased confusion , increased sensation to urinate    HPI:  Pt is a 87 y.o. male seen today for acute visit due to increased confusion and increased sensation to urinate.   Wife present during encounter.   He continues to live on the skilled nursing unit at [facility] Past medical history includes: vascular dementia, afib, HTN, PVD, constipation, urinary retention due to BPH, thrombocytopenia, and right renal mass.   10/04 he pulled out his  suprapubic catheter while balloon was still inflated. In addition, he was awake more at night and confused about what time of day it was. Catheter was replaced without complication. He did have hematuria for one day. Confusion subsided as well. Today, wife reports Peter Singh is not acting himself. He also tried to call her a few times last night. She is requesting a UA and culture due to recent catheter issues and changes in behavior. Vitals stable. Afebrile. Yobani reports constant sensation to urinate today. Remains on methenamine for bladder/renal infection prevention.      Past Medical History:  Diagnosis Date   Adult failure to thrive    Per incoming records from [facility]   Allergic rhinitis    Per incoming records from [facility]   Atrial fibrillation [facility])    BPH (benign prostatic hyperplasia)    Per incoming records from [place] of kidney [facility])    Right, Per incoming records from [place]    Per [facility] records from [facility]   Cerebrovascular accident, late effects    Per incoming records from [place] kidney disease, stage III (moderate) ([place])    Per incoming records from [place] [facility])    Per [facility] records from [facility]   Cognitive impairment    Mild, Per incoming records from [facility]   Constipation    Per incoming records from [facility]   Dementia [facility])    Diarrhea  Better off Aricept, Per incoming records from [facility]   Diastolic dysfunction    on 2D Echo 07/2009 and 2016, Per [facility] records [facility]   Diverticulosis    Mild, Per incoming records from [facility]   Diverticulosis of colon (without mention of hemorrhage)    ED (erectile dysfunction)    Per incoming records from  [facility]   History of Coumadin therapy    Per incoming records from [facility]   History of echocardiogram 09/2014   Per incoming records from [facility]   Hyperlipemia    Hypertension    Kidney mass    Per incoming records from [place] leg edema    Per incoming records from [place] [facility])    Neuropathy    Per incoming records from [place] (osteoarthritis)    Per incoming records from [facility]   Peripheral neuropathy    Per incoming records from [place] history of colonic [place] & 2004   adenomatous polyps   Pulmonary arterial hypertension ([place])    Per incoming records from [place]    Per [facility] records from [facility]   Thrombocytopenia [facility]) 2017   Per incoming records from [facility]   UTI (urinary tract infection)    Sepsis, Per incoming records from [place] hypertrophy    Gilford Rile as ambulation aid    Per incoming records from [facility]   Past Surgical History:  Procedure Laterality Date   APPENDECTOMY     Per incoming records from [place]     Per incoming records from [facility]   IR [place]  11/21/2019   KNEE SURGERY     right   PILONIDAL CYST DRAINAGE     POLYPECTOMY     Per incoming records from [place] tumor lumbar spine     SPINE SURGERY     tumor removed   THORACIC 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, Per incoming records from [place]      Allergies  Allergen Reactions   Penicillins Rash    Has patient had a PCN reaction causing immediate rash, facial/tongue/throat  swelling, SOB or lightheadedness with hypotension: unknown Has patient had a PCN reaction causing severe rash involving mucus membranes or skin necrosis: unknown Has patient had a PCN reaction that required hospitalization : unknown Has patient had a PCN reaction occurring within the last 10 years: no If all of the above answers are "NO", then may proceed with Cephalosporin use.     Outpatient Encounter Medications as of 04/16/2021  Medication Sig   acetaminophen (TYLENOL) 500 MG tablet Take 500 mg by mouth 2 (two) times daily. Scheduled and every 4 hours as needed (ending 05/05/2019)Document area of discomfort and effectiveness of medication.   Ascorbic Acid (VITAMIN C) 1000 MG tablet Take 1 tablet (1,000 mg total) by mouth daily.   atorvastatin (LIPITOR) 20 MG tablet Take 20 mg by mouth every evening.   cholecalciferol (VITAMIN D3) 25 MCG (1000 UT) tablet Take 2,000 Units by mouth daily.    DULoxetine (CYMBALTA) 20 MG capsule Take 20 mg by mouth daily.   guaiFENesin 200 MG tablet Take  3 tablets (600 mg total) by mouth in the morning and at bedtime.   ketoconazole (NIZORAL) 2 % shampoo Apply 1 application topically 2 (two) times a week. Once A Morning on Tue, Thu   methenamine (MANDELAMINE) 1 g tablet Take 1,000 mg by mouth 2 (two) times daily.   mineral oil-hydrophilic petrolatum (AQUAPHOR) ointment Apply topically daily. Right cheek and scalp   polyethylene glycol (MIRALAX / GLYCOLAX) 17 g packet Take 17 g by mouth daily.   triamcinolone lotion (KENALOG) 0.1 % Apply 1 application topically 2 (two) times daily as needed.    warfarin (COUMADIN) 5 MG tablet Take 5 mg by mouth daily.   No facility-administered encounter medications on file as of 04/16/2021.    Review of Systems  Constitutional:  Negative for activity change, appetite change, chills, fatigue and fever.  Respiratory:  Negative for cough, shortness of breath and wheezing.   Cardiovascular:  Negative for chest pain and leg  swelling.  Genitourinary:  Negative for dysuria, frequency and hematuria.       Suprapubic catheter, sensation to urinate  Psychiatric/Behavioral:  Positive for confusion. Negative for dysphoric mood. The patient is not nervous/anxious.    Immunization History  Administered Date(s) Administered   Influenza, High Dose Seasonal PF 05/04/2020   Influenza-Unspecified 04/17/2014, 05/01/2016, 04/15/2019   Moderna SARS-COV2 Booster Vaccination 05/17/2020   Moderna Sars-Covid-2 Vaccination 07/12/2019, 08/09/2019   Pneumococcal Conjugate-13 04/20/2014   Pneumococcal Polysaccharide-23 07/08/2003, 03/31/2011   Pneumococcal-Unspecified 01/26/2004   Td 08/23/2007   Tdap 01/07/2012   Zoster Recombinat (Shingrix) 10/05/2017, 12/09/2017   Zoster, Live 07/17/2005   Pertinent  Health Maintenance Due  Topic Date Due   INFLUENZA VACCINE  02/04/2021   Fall Risk  09/12/2020 10/30/2019 06/23/2019 05/07/2019 05/03/2019  Falls in the past year? 0 - 1 - -  Number falls in past yr: 0 - 1 - -  Injury with Fall? 0 - 1 - -  Risk for fall due to : - History of fall(s);Impaired balance/gait;Impaired mobility;Mental status change History of fall(s);Impaired balance/gait;Impaired mobility History of fall(s);Impaired balance/gait;Impaired mobility;Mental status change;Medication side effect History of fall(s);Impaired balance/gait;Impaired mobility;Medication side effect;Mental status change  Follow up - Falls evaluation completed;Education provided;Falls prevention discussed Falls evaluation completed Falls evaluation completed;Education provided;Falls prevention discussed Falls evaluation completed;Education provided;Falls prevention discussed  Comment - - - [facility] at [place]:    Vitals:   04/16/21 1630  BP: 124/60  Pulse: (!) 55  Resp: 20  Temp: (!) 97.2 F (36.2 C)  SpO2: 96%  Weight: 258 lb (117 kg)  Height: 6\' 7"  (2.007 m)   Body mass index is  29.06 kg/m. Physical Exam Vitals reviewed.  Constitutional:      General: He is not in acute distress. Cardiovascular:     Rate and Rhythm: Normal rate. Rhythm irregular.     Heart sounds: Normal heart sounds. No murmur heard. Pulmonary:     Effort: Pulmonary effort is normal. No respiratory distress.     Breath sounds: Normal breath sounds. No wheezing.  Abdominal:     General: Bowel sounds are normal. There is no distension.     Palpations: Abdomen is soft.     Tenderness: There is no abdominal tenderness.  Genitourinary:    Comments: Urine tea colored, no trauma at insertion site.  Skin:    General: Skin is warm and dry.  Neurological:     General: No focal deficit present.     Mental Status:  He is alert. Mental status is at baseline.     Motor: Weakness present.     Gait: Gait abnormal.  Psychiatric:        Mood and Affect: Mood normal.        Behavior: Behavior normal.        Cognition and Memory: Memory is impaired.    Labs reviewed: Recent Labs    12/07/20 0000  NA 141  K 4.3  CL 105  CO2 26*  BUN 18  CREATININE 1.2  CALCIUM 10.2   No results for input(s): AST, ALT, ALKPHOS, BILITOT, PROT, ALBUMIN in the last 8760 hours. Recent Labs    08/09/20 0000 12/07/20 0000 01/14/21 0000  WBC 3.4 2.5 2.9  HGB 14.2 13.5 13.5  HCT 42 40* 41  PLT 80* 93* 96*   Lab Results  Component Value Date   TSH 0.84 05/19/2019   Lab Results  Component Value Date   HGBA1C 6 08/22/2019   Lab Results  Component Value Date   CHOL 129 08/09/2020   HDL 32 (A) 08/09/2020   LDLCALC 80 08/09/2020   TRIG 86 08/09/2020    Significant Diagnostic Results in last 30 days:  No results found.  Assessment/Plan 1. Suprapubic catheter dysfunction, subsequent encounter ([place]) - d/t urinary retention/ BPH - increased confusion, tea colored urine, he reports constant sensation to urinate - 10/04 he pulled out catheter - remains afebrile, vitals stable, no trauma to catheter  site - UA and culture - advised to encourage fluids  2. Overactive bladder - see above   2. Vascular dementia without behavioral disturbance (HCC) - increased intermittent sundowning in past week - pleasant today - cont skilled nursing care    Family/ staff Communication: plan discussed with patient, wife and nurse  Labs/tests ordered:  UA/culture

## 2021-04-18 ENCOUNTER — Encounter: Payer: Self-pay | Admitting: Adult Health

## 2021-04-18 ENCOUNTER — Non-Acute Institutional Stay (SKILLED_NURSING_FACILITY): Payer: Medicare Other | Admitting: Adult Health

## 2021-04-18 DIAGNOSIS — R202 Paresthesia of skin: Secondary | ICD-10-CM | POA: Diagnosis not present

## 2021-04-18 DIAGNOSIS — B9620 Unspecified Escherichia coli [E. coli] as the cause of diseases classified elsewhere: Secondary | ICD-10-CM | POA: Diagnosis not present

## 2021-04-18 DIAGNOSIS — N3000 Acute cystitis without hematuria: Secondary | ICD-10-CM

## 2021-04-18 DIAGNOSIS — N400 Enlarged prostate without lower urinary tract symptoms: Secondary | ICD-10-CM | POA: Diagnosis not present

## 2021-04-18 MED ORDER — CEPHALEXIN 500 MG PO CAPS: 500.0000 mg | ORAL_CAPSULE | Freq: Three times a day (TID) | ORAL | 0 refills | Status: AC

## 2021-04-18 NOTE — Progress Notes (Addendum)
Location:  Occupational psychologist of Service:  SNF (31) Provider:   Cindi Carbon, [place] [phone]   [name], MD  [name], [name] [name], MD as PCP - Cardiology (Cardiology)  Extended Emergency Contact Information Primary Emergency Contact: Kress,[name] Address: [address]          [place], [place] [address] [name] of [place] Phone: [phone] Mobile Phone: [phone] Relation: Spouse Secondary Emergency Contact: Stahle,Amiel Address: [place] [address] [name] of [place] Phone: [phone] Mobile Phone: [phone] Relation: Son  Code Status:  DNR Goals of care: Advanced Directive information Advanced Directives 03/14/2021  Does Patient Have a Medical Advance Directive? Yes  Type of Paramedic of [place];Living will;Out of facility DNR (pink MOST or yellow form)  Does patient want to make changes to medical advance directive? No - Patient declined  Copy of [place] in Chart? Yes - validated most recent copy scanned in chart (See row information)  Pre-existing out of facility DNR order (yellow form or pink MOST form) Yellow form placed in chart (order not valid for inpatient use);Pink MOST form placed in chart (order not valid for inpatient use)     Chief Complaint  Patient presents with   Acute Visit    Hand swelling and paresthesia    HPI:  Pt is a 87 y.o. male seen today for an acute visit for hand swelling and paresthesia. The nurse reports he felt numbness and tingling in both hands today. Vitals were WNL. Eating and drinking as normal. He has some hand swelling and sometimes his hands become bluish/white at the tips. They question if he has Raynauds. At the time of the visit he denied any hand concerns. Two days ago he was seen by Amy NP  and a UA was ordered due to confusion and urinary symptoms. UA returned 04/18/2021 with 4+ bacteria 25-50 WBC. Culture pending. Pt denies any urinary symptoms at this time. He does have dementia and is forgetful of the details of his care.   Weights are fluctuating. He had covid last month and lost weight and is now gaining back. August 264 lbs, Sept 250 lbs, Oct 258 lbs.  Past Medical History:  Diagnosis Date   Adult failure to thrive    Per incoming records from [facility]   Allergic rhinitis    Per incoming records from [facility]   Atrial fibrillation [facility])    BPH (benign prostatic hyperplasia)    Per incoming records from [place] of kidney [facility])    Right, Per incoming records from [place]    Per incoming records from [facility]   Cerebrovascular accident, late effects    Per incoming records from [facility]   Chronic kidney disease, stage III (moderate) ([place])    Per incoming records from [facility])    Per [facility] records from [facility]   Cognitive impairment    Mild, Per incoming records from [facility]   Constipation    Per incoming records from [facility]   Dementia [facility])    Diarrhea    Better off Aricept, Per incoming records from [facility]   Diastolic dysfunction  on 2D Echo 07/2009 and 2016, Per incoming records from [facility]   Diverticulosis    Mild, Per incoming records from [facility]   Diverticulosis of colon (without mention of hemorrhage)    ED (erectile dysfunction)    Per incoming records from [facility]   History of Coumadin therapy    Per incoming records from [facility]   History of echocardiogram 09/2014   Per incoming records from [facility]   Hyperlipemia    Hypertension    Kidney mass    Per incoming records from [place] leg edema    Per incoming records from [facility])    Neuropathy    Per incoming records from [place] (osteoarthritis)    Per incoming records from [facility]   Peripheral neuropathy    Per incoming records from [place] history of colonic [place] & 2004   adenomatous polyps   Pulmonary arterial hypertension ([place])    Per incoming records from [place]    Per incoming records from [facility]   Thrombocytopenia [facility]) 2017   Per incoming records from [facility]   UTI (urinary tract infection)    Sepsis, Per incoming records from [place] hypertrophy    Gilford Rile as ambulation aid    Per incoming records from [facility]   Past Surgical History:  Procedure Laterality Date   APPENDECTOMY     Per incoming records from [place]     Per incoming records from [facility]   IR [place]  11/21/2019   KNEE SURGERY     right   PILONIDAL CYST DRAINAGE     POLYPECTOMY     Per incoming records from [place] tumor lumbar spine     SPINE SURGERY     tumor removed   THORACIC 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, Per incoming records from [place]      Allergies  Allergen Reactions   Penicillins Rash    Has patient had a PCN reaction causing immediate rash, facial/tongue/throat swelling, SOB or lightheadedness with hypotension: unknown Has patient had a PCN reaction causing severe rash involving mucus membranes or skin necrosis: unknown Has patient had a PCN reaction that  required hospitalization : unknown Has patient had a PCN reaction occurring within the last 10 years: no If all of the above answers are "NO", then may proceed with Cephalosporin use.     Outpatient Encounter Medications as of 04/18/2021  Medication Sig   cephALEXin (KEFLEX) 500 MG capsule Take 1 capsule (500 mg total) by mouth 3 (three) times daily for 7 days.   acetaminophen (TYLENOL) 500 MG tablet Take 500 mg by mouth 2 (two) times daily. Scheduled and every 4 hours as needed (ending 05/05/2019)Document area of discomfort and effectiveness of medication.   atorvastatin (LIPITOR) 20 MG tablet Take 20 mg by mouth every evening.   cholecalciferol (VITAMIN D3) 25 MCG (1000 UT) tablet Take 2,000 Units by mouth daily.    DULoxetine (CYMBALTA) 20 MG capsule Take 20 mg by mouth daily.   guaiFENesin 200 MG tablet Take 3 tablets (600 mg total) by mouth in the morning and at bedtime.  ketoconazole (NIZORAL) 2 % shampoo Apply 1 application topically 2 (two) times a week. Once A Morning on Tue, Thu   methenamine (MANDELAMINE) 1 g tablet Take 1,000 mg by mouth 2 (two) times daily.   mineral oil-hydrophilic petrolatum (AQUAPHOR) ointment Apply topically daily. Right cheek and scalp   polyethylene glycol (MIRALAX / GLYCOLAX) 17 g packet Take 17 g by mouth daily.   triamcinolone lotion (KENALOG) 0.1 % Apply 1 application topically 2 (two) times daily as needed.    warfarin (COUMADIN) 5 MG tablet Take 5 mg by mouth daily.   [DISCONTINUED] Ascorbic Acid (VITAMIN C) 1000 MG tablet Take 1 tablet (1,000 mg total) by mouth daily.   No facility-administered encounter medications on file as of 04/18/2021.    Review of Systems  Constitutional:  Negative for activity change, appetite change, chills, diaphoresis, fatigue, fever and unexpected weight change.  HENT:  Negative for congestion.   Respiratory:  Negative for cough, shortness of breath, wheezing and stridor.   Cardiovascular:  Positive for leg  swelling. Negative for chest pain and palpitations.  Gastrointestinal:  Negative for abdominal distention, abdominal pain, constipation and diarrhea.  Genitourinary:  Positive for urgency. Negative for decreased urine volume, difficulty urinating, dysuria, flank pain, frequency, penile discharge, penile pain, penile swelling and scrotal swelling.  Musculoskeletal:  Positive for gait problem. Negative for arthralgias, back pain, joint swelling and myalgias.  Neurological:  Positive for tremors and numbness. Negative for dizziness, seizures, syncope, facial asymmetry, speech difficulty, weakness and headaches.  Hematological:  Negative for adenopathy. Does not bruise/bleed easily.  Psychiatric/Behavioral:  Positive for confusion. Negative for agitation and behavioral problems.    Immunization History  Administered Date(s) Administered   Influenza, High Dose Seasonal PF 05/04/2020   Influenza-Unspecified 04/17/2014, 05/01/2016, 04/15/2019   Moderna SARS-COV2 Booster Vaccination 05/17/2020   Moderna Sars-Covid-2 Vaccination 07/12/2019, 08/09/2019   Pneumococcal Conjugate-13 04/20/2014   Pneumococcal Polysaccharide-23 07/08/2003, 03/31/2011   Pneumococcal-Unspecified 01/26/2004   Td 08/23/2007   Tdap 01/07/2012   Zoster Recombinat (Shingrix) 10/05/2017, 12/09/2017   Zoster, Live 07/17/2005   Pertinent  Health Maintenance Due  Topic Date Due   INFLUENZA VACCINE  02/04/2021   Fall Risk  09/12/2020 10/30/2019 06/23/2019 05/07/2019 05/03/2019  Falls in the past year? 0 - 1 - -  Number falls in past yr: 0 - 1 - -  Injury with Fall? 0 - 1 - -  Risk for fall due to : - History of fall(s);Impaired balance/gait;Impaired mobility;Mental status change History of fall(s);Impaired balance/gait;Impaired mobility History of fall(s);Impaired balance/gait;Impaired mobility;Mental status change;Medication side effect History of fall(s);Impaired balance/gait;Impaired mobility;Medication side effect;Mental status  change  Follow up - Falls evaluation completed;Education provided;Falls prevention discussed Falls evaluation completed Falls evaluation completed;Education provided;Falls prevention discussed Falls evaluation completed;Education provided;Falls prevention discussed  Comment - - - [facility] all addressed at [place]:    Vitals:   04/18/21 1622  BP: (!) 148/86  Pulse: 78  Resp: (!) 24  Temp: 97.7 F (36.5 C)  SpO2: 94%  Weight: 258 lb (117 kg)   Body mass index is 29.06 kg/m. Physical Exam Vitals and nursing note reviewed.  Constitutional:      General: He is not in acute distress.    Appearance: He is not diaphoretic.  HENT:     Head: Normocephalic and atraumatic.  Neck:     Thyroid: No thyromegaly.     Vascular: No JVD.     Trachea: No tracheal deviation.  Cardiovascular:  Rate and Rhythm: Normal rate. Rhythm irregular.     Heart sounds: No murmur heard. Pulmonary:     Effort: Pulmonary effort is normal. No respiratory distress.     Breath sounds: Normal breath sounds. No wheezing.  Abdominal:     General: Bowel sounds are normal. There is no distension (at s/p area).     Palpations: Abdomen is soft.     Tenderness: There is no abdominal tenderness. There is no right CVA tenderness or left CVA tenderness.  Musculoskeletal:        General: Swelling (both hands mild) present.     Right lower leg: Edema present.     Left lower leg: Edema present.  Lymphadenopathy:     Cervical: No cervical adenopathy.  Skin:    General: Skin is warm and dry.     Comments: White discoloration/blue color to both hands at the finger tips.   Neurological:     Mental Status: He is alert and oriented to person, place, and time.     Cranial Nerves: No cranial nerve deficit.  Psychiatric:        Mood and Affect: Mood normal.    Labs reviewed: Recent Labs    12/07/20 0000  NA 141  K 4.3  CL 105  CO2 26*  BUN 18  CREATININE 1.2  CALCIUM  10.2   No results for input(s): AST, ALT, ALKPHOS, BILITOT, PROT, ALBUMIN in the last 8760 hours. Recent Labs    08/09/20 0000 12/07/20 0000 01/14/21 0000  WBC 3.4 2.5 2.9  HGB 14.2 13.5 13.5  HCT 42 40* 41  PLT 80* 93* 96*   Lab Results  Component Value Date   TSH 0.84 05/19/2019   Lab Results  Component Value Date   HGBA1C 6 08/22/2019   Lab Results  Component Value Date   CHOL 129 08/09/2020   HDL 32 (A) 08/09/2020   LDLCALC 80 08/09/2020   TRIG 86 08/09/2020    Significant Diagnostic Results in last 30 days:  No results found.  Assessment/Plan 1. Acute cystitis without hematuria Mild Flush foley with 30 cc NS to ensure patency - cephALEXin (KEFLEX) 500 MG capsule; Take 1 capsule (500 mg total) by mouth 3 (three) times daily for 7 days.  Dispense: 21 capsule; Refill: 0 Has tolerated keflex in th epast   2. Paresthesia Denies symptoms as this time  Check electrolytes and B12 ?if this is due to prior covid infection The discoloration of his hands is not new. ? Raynauds. Breathing ok with normal sats. Continue to monitor.     Family/ staff Communication: nurse  Labs/tests ordered:  CMP B12

## 2021-04-19 ENCOUNTER — Encounter: Payer: Self-pay | Admitting: Adult Health

## 2021-04-19 DIAGNOSIS — R202 Paresthesia of skin: Secondary | ICD-10-CM | POA: Diagnosis not present

## 2021-04-19 LAB — HEPATIC FUNCTION PANEL
ALT: 6 — AB (ref 10–40)
AST: 12 — AB (ref 14–40)
Alkaline Phosphatase: 115 (ref 25–125)
Bilirubin, Direct: 0.3 (ref 0.01–0.4)
Bilirubin, Total: 1

## 2021-04-19 LAB — BASIC METABOLIC PANEL
BUN: 14 (ref 4–21)
CO2: 25 — AB (ref 13–22)
Chloride: 107 (ref 99–108)
Creatinine: 1.2 (ref 0.6–1.3)
Glucose: 105
Potassium: 4 (ref 3.4–5.3)
Sodium: 142 (ref 137–147)

## 2021-04-19 LAB — COMPREHENSIVE METABOLIC PANEL
Albumin: 3.5 (ref 3.5–5.0)
Calcium: 9.6 (ref 8.7–10.7)

## 2021-04-19 LAB — VITAMIN B12: Vitamin B-12: 217

## 2021-04-19 NOTE — Addendum Note (Signed)
Addended by: Charlyne Petrin on: 04/19/2021 08:24 AM   Modules accepted: Orders

## 2021-04-22 DIAGNOSIS — Z7901 Long term (current) use of anticoagulants: Secondary | ICD-10-CM | POA: Diagnosis not present

## 2021-05-13 DIAGNOSIS — Z7901 Long term (current) use of anticoagulants: Secondary | ICD-10-CM | POA: Diagnosis not present

## 2021-05-16 ENCOUNTER — Non-Acute Institutional Stay (SKILLED_NURSING_FACILITY): Payer: Medicare Other | Admitting: Adult Health

## 2021-05-16 DIAGNOSIS — R338 Other retention of urine: Secondary | ICD-10-CM

## 2021-05-16 DIAGNOSIS — F0150 Vascular dementia without behavioral disturbance: Secondary | ICD-10-CM | POA: Diagnosis not present

## 2021-05-16 DIAGNOSIS — I4811 Longstanding persistent atrial fibrillation: Secondary | ICD-10-CM

## 2021-05-16 DIAGNOSIS — L723 Sebaceous cyst: Secondary | ICD-10-CM

## 2021-05-16 DIAGNOSIS — I5032 Chronic diastolic (congestive) heart failure: Secondary | ICD-10-CM | POA: Diagnosis not present

## 2021-05-16 DIAGNOSIS — D696 Thrombocytopenia, unspecified: Secondary | ICD-10-CM

## 2021-05-16 DIAGNOSIS — N401 Enlarged prostate with lower urinary tract symptoms: Secondary | ICD-10-CM

## 2021-05-16 DIAGNOSIS — I10 Essential (primary) hypertension: Secondary | ICD-10-CM

## 2021-05-16 NOTE — Progress Notes (Signed)
Location:  [place] Room Number: 142 Place of Service:  SNF [phone]) Provider:  [name] NP   [name], MD  [name], [name] [name], MD as PCP - Cardiology (Cardiology)  Extended Emergency Contact Information Primary Emergency Contact: Michelini,[name] Address: [address]          [place], [place] [address] [name] of [place] Phone: [phone] Mobile Phone: [phone] Relation: Spouse Secondary Emergency Contact: Marchese,Henry Address: [place] [address] [name] of [place] Phone: [phone] Mobile Phone: [phone] Relation: Son  Code Status:  DNR Goals of care: Advanced Directive information Advanced Directives 04/19/2021  Does Patient Have a Medical Advance Directive? Yes  Type of Paramedic of [place];Living will;Out of facility DNR (pink MOST or yellow form)  Does patient want to make changes to medical advance directive? No - Patient declined  Copy of [place] in Chart? Yes - validated most recent copy scanned in chart (See row information)  Pre-existing out of facility DNR order (yellow form or pink MOST form) Yellow form placed in chart (order not valid for inpatient use);Pink MOST form placed in chart (order not valid for inpatient use)     Chief Complaint  Patient presents with   sebaceous cyst    HPI:  Pt is a 87 y.o. male seen today for medical management of chronic diseases and evaluation of cyst on back.    Patient remains on the skilled nursing unit at [facility]. PMH includes  Atrial Fibrillation, HTN , [place] diastolic HF, vascular dementia without behavioral disturbances, Urinary retention due to BPH, thrombocytopenia, HLD, and right renal mass  Cyst noted on patients upper back by staff. No fever, nausea or vomiting reported.    Vascular dementia patient continues to use walker for ambulation, feed self and participates in activities.  Continues with Suprapubic catheter due to history of BPH with retention.    Atrial Fibrillation- No palpations, SOB or chest pain. Last INR therapeutic   Blood pressure variable- most recent BP elevated at 982/64 Recorded systolic BP ranging in 158'X to 150's Weight stable at 265.5    Past Medical History:  Diagnosis Date   Adult failure to thrive    Per incoming records from [facility]   Allergic rhinitis    Per incoming records from [facility]   Atrial fibrillation [facility])    BPH (benign prostatic hyperplasia)    Per incoming records from [place] of kidney [facility])    Right, Per incoming records from [place]    Per incoming records from [facility]   Cerebrovascular accident, late effects    Per incoming records from [place] kidney disease, stage III (moderate) ([place])    Per incoming records from [facility])    Per [facility] records from [facility]   Cognitive impairment    Mild, Per incoming records from [facility]   Constipation    Per incoming records from [facility]   Dementia [facility])    Diarrhea    Better off Aricept, Per incoming records from [facility]   Diastolic dysfunction    on 2D Echo 07/2009 and 2016, Per incoming records [facility]   Diverticulosis    Mild, Per incoming records from [facility]   Diverticulosis of colon (without mention of hemorrhage)    ED (erectile dysfunction)    Per incoming records from [facility]   History of Coumadin therapy    Per incoming records from [facility]   History of echocardiogram 09/2014   Per incoming  records from [facility]   Hyperlipemia    Hypertension    Kidney mass    Per incoming records from [place] leg edema    Per incoming records from [place] [facility])    Neuropathy    Per incoming records from [facility]   OA (osteoarthritis)    Per incoming records from [facility]   Peripheral neuropathy    Per incoming records from [place] history of colonic [place] & 2004   adenomatous polyps   Pulmonary arterial hypertension ([place])    Per incoming records from [place]    Per [facility] from [facility]   Thrombocytopenia [facility]) 2017   Per incoming records from [facility]   UTI (urinary tract infection)    Sepsis, Per incoming records from [place] hypertrophy    Gilford Rile as ambulation aid    Per incoming records from [facility]   Past Surgical History:  Procedure Laterality Date   APPENDECTOMY     Per incoming records from [place]     [facility]   IR [place]  11/21/2019   KNEE SURGERY     right   PILONIDAL CYST DRAINAGE     POLYPECTOMY     Per incoming records from [place] tumor lumbar spine     SPINE SURGERY     tumor removed   THORACIC 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, Per incoming records from [place]      Allergies  Allergen Reactions   Penicillins Rash    Has patient had a PCN reaction causing immediate rash, facial/tongue/throat swelling, SOB or lightheadedness with hypotension: unknown Has patient had a PCN reaction causing severe rash involving mucus membranes or skin necrosis: unknown Has patient  had a PCN reaction that required hospitalization : unknown Has patient had a PCN reaction occurring within the last 10 years: no If all of the above answers are "NO", then may proceed with Cephalosporin use.     Outpatient Encounter Medications as of 05/16/2021  Medication Sig   acetaminophen (TYLENOL) 500 MG tablet Take 500 mg by mouth 2 (two) times daily. Scheduled and every 4 hours as needed (ending 05/05/2019)Document area of discomfort and effectiveness of medication.   atorvastatin (LIPITOR) 20 MG tablet Take 20 mg by mouth every evening.   cholecalciferol (VITAMIN D3) 25 MCG (1000 UT) tablet Take 2,000 Units by mouth daily.    DULoxetine (CYMBALTA) 20 MG capsule Take 20 mg by mouth daily.   methenamine (MANDELAMINE) 1 g tablet Take 1,000 mg by mouth 2 (two) times daily.   polyethylene glycol (MIRALAX / GLYCOLAX) 17 g packet Take 17 g by mouth daily.   triamcinolone lotion (KENALOG) 0.1 % Apply 1 application topically 2 (two) times daily as needed.    warfarin (COUMADIN)  5 MG tablet Take 5 mg by mouth daily.   No facility-administered encounter medications on file as of 05/16/2021.    Review of Systems  Constitutional:  Negative for activity change, appetite change, fatigue and fever.  Respiratory:  Negative for shortness of breath and wheezing.   Cardiovascular:  Positive for leg swelling.  Genitourinary:  Negative for urgency.  Musculoskeletal:  Positive for gait problem.  Skin:        Cyst on right side of upper back   Neurological:  Positive for tremors.  Psychiatric/Behavioral:  Positive for confusion. Negative for behavioral problems. The patient is not nervous/anxious.    Immunization History  Administered Date(s) Administered   Influenza, High Dose Seasonal PF 05/04/2020   Influenza-Unspecified 04/17/2014, 05/01/2016, 04/15/2019   Moderna SARS-COV2 Booster Vaccination 05/17/2020   Moderna Sars-Covid-2 Vaccination 07/12/2019, 08/09/2019   Pneumococcal Conjugate-13  04/20/2014   Pneumococcal Polysaccharide-23 07/08/2003, 03/31/2011   Pneumococcal-Unspecified 01/26/2004   Td 08/23/2007   Tdap 01/07/2012   Zoster Recombinat (Shingrix) 10/05/2017, 12/09/2017   Zoster, Live 07/17/2005   Pertinent  Health Maintenance Due  Topic Date Due   INFLUENZA VACCINE  02/04/2021   Fall Risk 06/02/2019 06/23/2019 10/30/2019 11/21/2019 09/12/2020  Falls in the past year? - 1 - - 0  Was there an injury with Fall? - 1 - - 0  Fall Risk Category Calculator - 3 - - 0  Fall Risk Category - High - - Low  Patient Fall Risk Level High fall risk High fall risk - High fall risk Low fall risk  Patient at Risk for Falls Due to - History of fall(s);Impaired balance/gait;Impaired mobility History of fall(s);Impaired balance/gait;Impaired mobility;Mental status change - -  Fall risk Follow up - Falls evaluation completed Falls evaluation completed;Education provided;Falls prevention discussed - -  Fall risk Follow up - - - - -   Functional Status Survey:    Vitals:   05/16/21 1649  BP: (!) 160/75  Pulse: 73  Temp: (!) 97.2 F (36.2 C)  SpO2: 98%  Weight: 265 lb 8 oz (120.4 kg)   Body mass index is 29.91 kg/m. Physical Exam Constitutional:      General: He is not in acute distress. HENT:     Head: Normocephalic.     Mouth/Throat:     Pharynx: Oropharynx is clear.  Eyes:     Pupils: Pupils are equal, round, and reactive to light.  Cardiovascular:     Rate and Rhythm: Normal rate. Rhythm irregular.     Pulses: Normal pulses.     Heart sounds: Normal heart sounds.  Pulmonary:     Effort: Pulmonary effort is normal.     Breath sounds: Normal breath sounds.  Abdominal:     General: Bowel sounds are normal.  Musculoskeletal:     Cervical back: Normal range of motion.     Right lower leg: Edema present.     Left lower leg: Edema present.     Comments: Wears compression stockings   Skin:    General: Skin is warm and dry.     Findings: Erythema present.           Comments: Sebaceous cyst on right upper back, surrounding skin intact, no pustular drainage or odor present.   Neurological:     Mental Status: He is alert. Mental status is at baseline.     Gait: Gait abnormal.  Psychiatric:        Mood and Affect: Mood normal.  Behavior: Behavior normal.    Labs reviewed: Recent Labs    12/07/20 0000 03/14/21 0000 04/19/21 0000  NA 141 138 142  K 4.3 4.0 4.0  CL 105 106 107  CO2 26* 24* 25*  BUN 18 20 14   CREATININE 1.2 1.0 1.2  CALCIUM 10.2 9.8 9.6   Recent Labs    03/14/21 0000 04/19/21 0000  AST 12* 12*  ALT 10 6*  ALKPHOS 94 115  ALBUMIN 3.3* 3.5   Recent Labs    12/07/20 0000 01/14/21 0000 03/14/21 0000  WBC 2.5 2.9 2.3  HGB 13.5 13.5 13.1*  HCT 40* 41 39*  PLT 93* 96* 113*   Lab Results  Component Value Date   TSH 0.84 05/19/2019   Lab Results  Component Value Date   HGBA1C 6 08/22/2019   Lab Results  Component Value Date   CHOL 129 08/09/2020   HDL 32 (A) 08/09/2020   LDLCALC 80 08/09/2020   TRIG 86 08/09/2020    Significant Diagnostic Results in last 30 days:  No results found.  Assessment/Plan 1. Sebaceous cyst Referral to dermatology for evaluation and treatment  2. Chronic diastolic heart failure (HCC) Chronic edema ongoing in BLE. Continue compression stockings Not on any diuretics, continue to monitor.   3. Longstanding persistent atrial fibrillation (HCC) Rate is controlled  Continue Coumadin to reduce risk of CVA  4. Vascular dementia without behavioral disturbance (HCC) Progressive decline in cognition and physical function due to disease progression Continue supportive care in skilled nursing environment  5. Urinary retention due to benign prostatic hyperplasia Continue suprapubic catheter  Continue methenamine for UTI prevention   6. Thrombocytopenia ([place]) 9/8 Platelets lab level 113. Continue to monitor   7. Essential hypertension Blood pressure variable- most recent BP  elevated at 038/88 Systolic BP ranging in 280'K to 150's.  Order for daily manual BP daily x 1 week   Family/ staff Communication: nurse  Labs/tests ordered:  INR   Seen and examined and agree with the above plan of care

## 2021-05-17 ENCOUNTER — Encounter: Payer: Self-pay | Admitting: Adult Health

## 2021-05-23 DIAGNOSIS — L720 Epidermal cyst: Secondary | ICD-10-CM | POA: Diagnosis not present

## 2021-05-23 DIAGNOSIS — Z85828 Personal history of other malignant neoplasm of skin: Secondary | ICD-10-CM | POA: Diagnosis not present

## 2021-05-23 DIAGNOSIS — Z85820 Personal history of malignant melanoma of skin: Secondary | ICD-10-CM | POA: Diagnosis not present

## 2021-05-25 DIAGNOSIS — N390 Urinary tract infection, site not specified: Secondary | ICD-10-CM | POA: Diagnosis not present

## 2021-05-25 LAB — BASIC METABOLIC PANEL
BUN: 18 (ref 4–21)
CO2: 25 — AB (ref 13–22)
Chloride: 108 (ref 99–108)
Creatinine: 1.1 (ref 0.6–1.3)
Glucose: 106
Potassium: 4.1 (ref 3.4–5.3)
Sodium: 144 (ref 137–147)

## 2021-05-25 LAB — COMPREHENSIVE METABOLIC PANEL
Albumin: 3.7 (ref 3.5–5.0)
Calcium: 9.9 (ref 8.7–10.7)
GFR calc Af Amer: 72.7
GFR calc non Af Amer: 62.73
Globulin: 2

## 2021-05-25 LAB — CBC: RBC: 4.19 (ref 3.87–5.11)

## 2021-05-25 LAB — HEPATIC FUNCTION PANEL
ALT: 5 — AB (ref 10–40)
AST: 11 — AB (ref 14–40)
Alkaline Phosphatase: 128 — AB (ref 25–125)
Bilirubin, Total: 0.8

## 2021-05-25 LAB — CBC AND DIFFERENTIAL
HCT: 37 — AB (ref 41–53)
Hemoglobin: 12.4 — AB (ref 13.5–17.5)
Platelets: 97 — AB (ref 150–399)
WBC: 2.6

## 2021-06-03 DIAGNOSIS — Z7901 Long term (current) use of anticoagulants: Secondary | ICD-10-CM | POA: Diagnosis not present

## 2021-06-03 LAB — PROTIME-INR: INR: 3.2 — AB (ref 0.9–1.1)

## 2021-06-03 LAB — POCT INR: INR: 3.2 — AB (ref 0.9–1.1)

## 2021-06-13 ENCOUNTER — Non-Acute Institutional Stay (SKILLED_NURSING_FACILITY): Payer: Medicare Other | Admitting: Adult Health

## 2021-06-13 ENCOUNTER — Encounter: Payer: Self-pay | Admitting: Adult Health

## 2021-06-13 DIAGNOSIS — R635 Abnormal weight gain: Secondary | ICD-10-CM

## 2021-06-13 DIAGNOSIS — I10 Essential (primary) hypertension: Secondary | ICD-10-CM

## 2021-06-13 DIAGNOSIS — I5032 Chronic diastolic (congestive) heart failure: Secondary | ICD-10-CM

## 2021-06-13 DIAGNOSIS — R338 Other retention of urine: Secondary | ICD-10-CM

## 2021-06-13 DIAGNOSIS — N401 Enlarged prostate with lower urinary tract symptoms: Secondary | ICD-10-CM | POA: Diagnosis not present

## 2021-06-13 DIAGNOSIS — F0150 Vascular dementia without behavioral disturbance: Secondary | ICD-10-CM | POA: Diagnosis not present

## 2021-06-13 DIAGNOSIS — D696 Thrombocytopenia, unspecified: Secondary | ICD-10-CM

## 2021-06-13 DIAGNOSIS — I4811 Longstanding persistent atrial fibrillation: Secondary | ICD-10-CM | POA: Diagnosis not present

## 2021-06-13 NOTE — Progress Notes (Addendum)
Location:  [place] Room Number: 142-A Place of Service:  SNF [phone]) Provider:  [name], NP   [name], [name] [name], MD as PCP - Cardiology (Cardiology)  Extended Emergency Contact Information Primary Emergency Contact: Collet,[name] Address: [address]          [place], [place] [address] [name] of [place] Phone: [phone] Mobile Phone: [phone] Relation: Spouse Secondary Emergency Contact: Roets,Alvia Address: [place] [address] [name] of [place] Phone: [phone] Mobile Phone: [phone] Relation: Son  Code Status:  DNR Goals of care: Advanced Directive information Advanced Directives 06/13/2021  Does Patient Have a Medical Advance Directive? Yes  Type of Paramedic of [place];Living will;Out of facility DNR (pink MOST or yellow form)  Does patient want to make changes to medical advance directive? No - Patient declined  Copy of [place] in Chart? Yes - validated most recent copy scanned in chart (See row information)  Pre-existing out of facility DNR order (yellow form or pink MOST form) Yellow form placed in chart (order not valid for inpatient use);Pink MOST form placed in chart (order not valid for inpatient use)     Chief Complaint  Patient presents with   Medical Management of Chronic Issues    Routine visit and discuss need for covid booster.     HPI:  Pt is a 87 y.o. male seen today for medical management of chronic diseases.   Patient remains on the skilled nursing unit at [facility]. 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  The nurse reports that he is progressively more confused and  asks repetitive questions but remains pleasant. He is ambulatory with a walker and participates in activities. MMSE 24/30 08/26/20  Afib: On coumadin for CVA risk reduction. No acute events. No palpitations, sob, doe, etc. Has chronic edema in both lower ext and wears compression hose. INR due next month   Urinary retention: has s/p cath with no acute issues. Changes monthly per staff  Chronic thrombocytopenia and leukopenia: previously followed by [name] ?MDS . No longer goes to apts due to goals of care.  Lab Results  Component Value Date   PLT 97 (A) 05/25/2021   Lab Results  Component Value Date   WBC 2.6 05/25/2021   Bps reviewed in matrix  Blood Pressure: 158 / 85 mmHg  Blood Pressure: 164 / 80 mmHg  Blood Pressure: 148 / 74 mmHg   Blood Pressure: 139 / 72 mmHg  Blood Pressure: 153 / 79 mmHg  Blood Pressure: 148 / 87 mmHg   These were done on the automatic cuff. When manual bps are done they read lower.  Resident had an episode of slurred speech 11/27 lasting about thirty min which resolved. Wife declined ER visit. Resident improved.   Wt Readings from Last 3 Encounters:  06/13/21 265 lb (120.2 kg)  05/16/21 265 lb 8 oz (120.4 kg)  04/18/21 258 lb (117 kg)    Past Medical History:  Diagnosis Date   Adult failure to thrive    Per incoming records from [facility]   Allergic rhinitis    Per incoming records from [facility]   Atrial fibrillation ([place])  BPH (benign prostatic hyperplasia)    Per incoming records from [place] [facility])    Right, Per incoming records from [place]    Per incoming records from [facility]   Cerebrovascular accident, late effects    Per incoming records from [facility]   Chronic kidney disease, stage III (moderate) ([place])    Per incoming records from [facility])     Per incoming records from [facility]   Cognitive impairment    Mild, Per incoming records from [facility]   Constipation    Per incoming records from [facility]   Dementia [facility])    Diarrhea    Better off Aricept, Per incoming records from [facility]   Diastolic dysfunction    on 2D Echo 07/2009 and 2016, Per incoming records from [facility]   Diverticulosis    Mild, Per incoming records from [facility]   Diverticulosis of colon (without mention of hemorrhage)    ED (erectile dysfunction)    Per incoming records from [facility]   History of Coumadin therapy    Per incoming records from [facility]   History of echocardiogram 09/2014   Per incoming records from [facility]   Hyperlipemia    Hypertension    Kidney mass    Per incoming records from [place] leg edema    Per incoming records from [facility])    Neuropathy    Per incoming records from [place] (osteoarthritis)    Per incoming records from [facility]   Peripheral neuropathy    Per incoming records from [place] history of colonic [place] & 2004   adenomatous polyps   Pulmonary arterial hypertension ([place])    Per incoming records from [place]    Per incoming records from [facility]   Thrombocytopenia [facility]) 2017   Per incoming records from [facility]   UTI (urinary tract infection)    Sepsis, Per incoming records from [place] hypertrophy    Gilford Rile as ambulation aid    Per incoming records from [facility]   Past Surgical History:  Procedure Laterality Date   APPENDECTOMY     Per incoming records from [place]     Per incoming records from [place]  11/21/2019   KNEE SURGERY     right   PILONIDAL CYST DRAINAGE     POLYPECTOMY     Per incoming records from [place] tumor lumbar spine     SPINE SURGERY     tumor removed   THORACIC 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     Outpatient Encounter Medications as of 06/13/2021  Medication Sig   acetaminophen (TYLENOL) 500 MG tablet Take 500 mg by mouth 2 (two) times daily. Scheduled and every 4 hours as needed (ending 05/05/2019)Document area of discomfort and effectiveness of medication.   atorvastatin (LIPITOR) 20 MG tablet Take 20 mg by mouth every evening.   cholecalciferol (VITAMIN D3) 25 MCG (1000 UT) tablet Take 2,000 Units by mouth daily.    DULoxetine (CYMBALTA) 20 MG capsule Take 20 mg by mouth daily.   methenamine (MANDELAMINE) 1 g tablet Take 1,000 mg by mouth 2 (two) times daily.   polyethylene glycol (MIRALAX / GLYCOLAX) 17 g packet Take 17 g by mouth daily.   triamcinolone lotion (KENALOG) 0.1 % Apply 1 application topically 2 (two) times daily as needed.    warfarin (COUMADIN) 2 MG tablet Take 4.5 mg by mouth once a week. On Wednesday and 5 mg all other days   warfarin (COUMADIN) 5 MG tablet Take 5 mg by  mouth daily. EXCEPT 4.5 mg on Wednesday   No facility-administered encounter medications on file as of 06/13/2021.    Review of Systems  Constitutional:  Negative for activity change, appetite change, chills, diaphoresis, fatigue, fever and unexpected weight change.  Respiratory:  Negative for cough, shortness of breath, wheezing and stridor.   Cardiovascular:  Positive for leg swelling. Negative for chest pain and palpitations.  Gastrointestinal:  Negative for abdominal distention, abdominal pain, constipation and diarrhea.  Genitourinary:  Negative for difficulty urinating and dysuria.  Musculoskeletal:  Positive for gait problem. Negative for arthralgias, back pain, joint swelling and myalgias.  Neurological:  Negative for dizziness, seizures, syncope, facial asymmetry, speech difficulty, weakness and headaches.  Hematological:  Negative for adenopathy. Does not bruise/bleed easily.  Psychiatric/Behavioral:  Positive for confusion. Negative for agitation and behavioral problems.    Immunization History  Administered Date(s) Administered   Influenza, High Dose Seasonal PF 05/04/2020, 04/10/2021   Influenza-Unspecified 04/17/2014, 05/01/2016, 04/15/2019   Moderna Covid-19 Vaccine Bivalent Booster 57yrs & up 04/17/2021   Moderna SARS-COV2 Booster Vaccination 05/17/2020   Moderna Sars-Covid-2 Vaccination 07/12/2019, 08/09/2019   Pneumococcal Conjugate-13 04/20/2014   Pneumococcal Polysaccharide-23 07/08/2003, 03/31/2011   Pneumococcal-Unspecified 01/26/2004   Td 08/23/2007   Tdap 01/07/2012   Zoster Recombinat (Shingrix) 10/05/2017, 12/09/2017   Zoster, Live 07/17/2005   Pertinent  Health Maintenance Due  Topic Date Due   INFLUENZA VACCINE  Completed   Fall Risk 06/02/2019 06/23/2019 10/30/2019 11/21/2019 09/12/2020  Falls in the past year? - 1 - - 0  Was there an injury with Fall? - 1 - - 0  Fall Risk Category Calculator - 3 - - 0  Fall Risk Category - High - - Low  Patient Fall  Risk Level High fall risk High fall risk - High fall risk Low fall risk  Patient at Risk for Falls Due to - History of fall(s);Impaired balance/gait;Impaired mobility History of fall(s);Impaired balance/gait;Impaired mobility;Mental status change - -  Fall risk Follow up - Falls evaluation completed Falls evaluation completed;Education provided;Falls prevention discussed - -  Fall risk Follow up - - - - -   Functional Status Survey:    Vitals:   06/13/21 1007  BP: (!) 164/80  Pulse: 66  Resp: (!) 22  Temp: (!) 97.3 F (36.3 C)  SpO2: 98%  Weight: 265 lb (120.2 kg)  Height: 6\' 7"  (2.007 m)  Body mass index is 29.85 kg/m. Physical Exam Vitals and nursing note reviewed.  Constitutional:      General: He is not in acute distress.    Appearance: He is not diaphoretic.  HENT:     Head: Normocephalic and atraumatic.     Mouth/Throat:     Mouth: Mucous membranes are moist.     Pharynx: Oropharynx is clear.  Neck:     Thyroid: No thyromegaly.     Vascular: No JVD.     Trachea: No tracheal deviation.  Cardiovascular:     Rate and Rhythm: Normal rate. Rhythm irregular.     Heart sounds: No murmur heard. Pulmonary:     Effort: Pulmonary effort is normal. No respiratory distress.     Breath sounds: Normal breath sounds. No wheezing.  Abdominal:     General: Bowel sounds are normal. There is no distension.     Palpations: Abdomen is soft.     Tenderness: There is no abdominal tenderness.  Musculoskeletal:     Comments: BLE edema +1  Lymphadenopathy:     Cervical: No cervical adenopathy.  Skin:    General: Skin is warm and dry.     Comments: Multiple various SKs noted to face, chest   Neurological:     General: No focal deficit present.     Mental Status: He is alert. Mental status is at baseline.  Psychiatric:        Mood and Affect: Mood normal.    Labs reviewed: Recent Labs    03/14/21 0000 04/19/21 0000 05/25/21 0000  NA 138 142 144  K 4.0 4.0 4.1  CL 106 107  108  CO2 24* 25* 25*  BUN 20 14 18   CREATININE 1.0 1.2 1.1  CALCIUM 9.8 9.6 9.9   Recent Labs    03/14/21 0000 04/19/21 0000 05/25/21 0000  AST 12* 12* 11*  ALT 10 6* 5*  ALKPHOS 94 115 128*  ALBUMIN 3.3* 3.5 3.7   Recent Labs    01/14/21 0000 03/14/21 0000 05/25/21 0000  WBC 2.9 2.3 2.6  HGB 13.5 13.1* 12.4*  HCT 41 39* 37*  PLT 96* 113* 97*   Lab Results  Component Value Date   TSH 0.84 05/19/2019   Lab Results  Component Value Date   HGBA1C 6 08/22/2019   Lab Results  Component Value Date   CHOL 129 08/09/2020   HDL 32 (A) 08/09/2020   LDLCALC 80 08/09/2020   TRIG 86 08/09/2020    Significant Diagnostic Results in last 30 days:  No results found.  Assessment/Plan  1. Essential hypertension BP elevated with edema, pt was on HCTZ in the past Lasix 20 mg three times weekly  Kdur 10 three times weekly BMP next draw Wkly weights x 4  2. Weight gain See #1  3. Chronic diastolic heart failure (HCC) Echo 09/06/14 EF 55-60% with severely dilated left atrium, right atrium moderate to severely dilated, normal systolic function See #1  4. Longstanding persistent atrial fibrillation (HCC) Rate is controlled Continue coumadin for CVA risk reduction   5. Vascular dementia without behavioral disturbance (HCC) Progressive decline in cognition and physical function c/w the disease. Continue supportive care in the skilled environment.  6. Urinary retention due to benign prostatic hyperplasia No new issues Continue s/p cath [facility]   7. Thrombocytopenia ([place]) No further work up due to dementia and age Continue to monitor    Labs/tests ordered:  BMP with next INR

## 2021-06-14 ENCOUNTER — Encounter: Payer: Self-pay | Admitting: Adult Health

## 2021-07-01 DIAGNOSIS — Z7901 Long term (current) use of anticoagulants: Secondary | ICD-10-CM | POA: Diagnosis not present

## 2021-07-02 DIAGNOSIS — I10 Essential (primary) hypertension: Secondary | ICD-10-CM | POA: Diagnosis not present

## 2021-07-02 DIAGNOSIS — Z7901 Long term (current) use of anticoagulants: Secondary | ICD-10-CM | POA: Diagnosis not present

## 2021-07-02 LAB — BASIC METABOLIC PANEL
BUN: 18 (ref 4–21)
CO2: 25 — AB (ref 13–22)
Chloride: 106 (ref 99–108)
Creatinine: 1.1 (ref 0.6–1.3)
Glucose: 104
Potassium: 4.4 (ref 3.4–5.3)
Sodium: 143 (ref 137–147)

## 2021-07-02 LAB — COMPREHENSIVE METABOLIC PANEL: Calcium: 10.3 (ref 8.7–10.7)

## 2021-07-05 ENCOUNTER — Non-Acute Institutional Stay (INDEPENDENT_AMBULATORY_CARE_PROVIDER_SITE_OTHER): Payer: Medicare Other | Admitting: Adult Health

## 2021-07-05 ENCOUNTER — Encounter: Payer: Self-pay | Admitting: Adult Health

## 2021-07-05 DIAGNOSIS — Z0000 Encounter for general adult medical examination without abnormal findings: Secondary | ICD-10-CM | POA: Diagnosis not present

## 2021-07-05 NOTE — Progress Notes (Signed)
Subjective:   Peter Singh is a 87 y.o. male who presents for an Initial Medicare Annual Wellness Visit at [employer]   Review of Systems     Cardiac Risk Factors include: advanced age (>63mn, >>66women)     Objective:    Today's Vitals   07/05/21 0942  BP: (!) 146/74  Pulse: 71  Resp: 18  Temp: 98.2 F (36.8 C)  SpO2: 97%  Weight: 252 lb 9.6 oz (114.6 kg)  Height: '6\' 7"'  (2.007 m)   Body mass index is 28.46 kg/m.  Advanced Directives 07/05/2021 07/05/2021 06/13/2021 04/19/2021 03/14/2021 02/15/2021 01/24/2021  Does Patient Have a Medical Advance Directive? Yes Yes Yes Yes Yes Yes Yes  Type of AParamedicof ABuffalo[place] of facility DNR (pink MOST or yellow form) HE[place] Pe[place]Living will;Out of facility DNR (pink MOST or yellow form) [place]Living will;Out of facility DNR (pink MOST or yellow form) H[place]int[place]Living will;Out of facility DNR (pink MOST or yellow form) [place]Living will;Out of facility DNR (pink MOST or yellow form) HD[place]olaLiving will;Out of facility DNR (pink MOST or yellow form) [place]Living will;Out of facility DNR (pink MOST or yellow form)  Does patient want to make changes to medical advance directive? - No - Patient declined No - Patient declined No - Patient declined No - Patient declined No - Patient declined No - Patient declined  Copy of HWhitingin Chart? Yes - validated most recent copy scanned in chart (See row information) Yes - validated most recent copy scanned in chart (See row information) Yes - validated most recent copy scanned in chart (See row information) Yes - validated most recent copy scanned in chart (See row information) Yes - validated most recent copy scanned in chart (See row information) Yes - validated most recent copy scanned in chart (See row information) Yes - validated  most recent copy scanned in chart (See row information)  Pre-existing out of facility DNR order (yellow form or pink MOST form) - - Yellow form placed in chart (order not valid for inpatient use);Pink MOST form placed in chart (order not valid for inpatient use) Yellow form placed in chart (order not valid for inpatient use);Pink MOST form placed in chart (order not valid for inpatient use) Yellow form placed in chart (order not valid for inpatient use);Pink MOST form placed in chart (order not valid for inpatient use) Yellow form placed in chart (order not valid for inpatient use);Pink MOST form placed in chart (order not valid for inpatient use) Yellow form placed in chart (order not valid for inpatient use);Pink MOST form placed in chart (order not valid for inpatient use)    Current Medications (verified) Outpatient Encounter Medications as of 07/05/2021  Medication Sig   acetaminophen (TYLENOL) 500 MG tablet Take 500 mg by mouth 2 (two) times daily. Scheduled and every 4 hours as needed (ending 05/05/2019)Document area of discomfort and effectiveness of medication.   atorvastatin (LIPITOR) 20 MG tablet Take 20 mg by mouth every evening.   cholecalciferol (VITAMIN D3) 25 MCG (1000 UT) tablet Take 2,000 Units by mouth daily.    DULoxetine (CYMBALTA) 20 MG capsule Take 20 mg by mouth daily.   furosemide (LASIX) 20 MG tablet Take 20 mg by mouth. Once A Day on Tue, Thu, Sat   methenamine (MANDELAMINE) 1 g tablet Take 1,000 mg by mouth 2 (two) times daily.   polyethylene  glycol (MIRALAX / GLYCOLAX) 17 g packet Take 17 g by mouth daily.   potassium chloride (MICRO-K) 10 MEQ CR capsule Take 10 mEq by mouth. Once A Day on Tue, Thu, Sat   triamcinolone lotion (KENALOG) 0.1 % Apply 1 application topically 2 (two) times daily as needed.    warfarin (COUMADIN) 2 MG tablet Take 4 mg by mouth. Once An Evening on Mon, Wed, Fri   warfarin (COUMADIN) 5 MG tablet Take 5 mg by mouth. Once A Day on Sun, Tue, Thu,  Sat   No facility-administered encounter medications on file as of 07/05/2021.    Allergies (verified) Penicillins   History: Past Medical History:  Diagnosis Date   Adult failure to thrive    Per incoming records from [facility]   Allergic rhinitis    Per incoming records from [place] fibrillation [facility])    BPH (benign prostatic hyperplasia)    Per incoming records from [place] of kidney [facility])    Right, Per incoming records from [place]    Per incoming records from [facility]   Cerebrovascular accident, late effects    Per incoming records from [place] kidney disease, stage III (moderate) ([place])    Per incoming records from [facility])    Per incoming records from [facility]   Cognitive impairment    Mild, Per incoming records from [facility]   Constipation    Per incoming records from [facility]   Dementia [facility])    Diarrhea    Better off Aricept, Per incoming records from [facility]   Diastolic dysfunction    on 2D Echo 07/2009 and 2016, Per incoming records from [facility]   Diverticulosis    Mild, Per incoming records from [facility]   Diverticulosis of colon (without mention of hemorrhage)    ED (erectile dysfunction)    Per incoming records from [facility]   History of Coumadin therapy    Per incoming records from [facility]   History of echocardiogram 09/2014   Per incoming records from [facility]   Hyperlipemia    Hypertension    Kidney mass    Per incoming records from [place] leg edema    Per incoming records from [facility])    Neuropathy    Per incoming  records from [facility]   OA (osteoarthritis)    Per incoming records from [facility]   Peripheral neuropathy    Per incoming records from [place] history of colonic [place] & 2004   adenomatous polyps   Pulmonary arterial hypertension ([place])    Per incoming records from [place]    Per incoming records from [facility]   Thrombocytopenia [facility]) 2017   Per incoming records from [facility]   UTI (urinary tract infection)    Sepsis, Per incoming records from [place] as ambulation aid    Per incoming records from [facility]   Past Surgical History:  Procedure Laterality Date   APPENDECTOMY     Per incoming records from [place]     Per incoming  records from [place]  11/21/2019   KNEE SURGERY     right   PILONIDAL CYST DRAINAGE     POLYPECTOMY     Per incoming records from [place] tumor lumbar spine     SPINE SURGERY     tumor removed   THORACIC 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, Per incoming records from [place]     Family History  Problem Relation Age of Onset   Stroke Father    CVA Father    Tuberculosis Father    Neuropathy Neg Hx    Social History   Socioeconomic History   Marital status: Married    Spouse name: Not on file   Number of children: 2   Years of education: Not on file   Highest education level: Bachelor's degree (e.g., BA, AB, BS)  Occupational History   Occupation: retired  Tobacco Use   Smoking status: Former    Packs/day: 3.00    Types: Cigarettes    Quit date: 06/09/1975    Years since quitting: 46.1   Smokeless tobacco: Never  Vaping Use    Vaping Use: Never used  Substance and Sexual Activity   Alcohol use: Yes    Alcohol/week: 0.0 standard drinks    Comment: 2-3 drinks per day   Drug use: No   Sexual activity: Not on file  Other Topics Concern   Not on file  Social History Narrative   Patient is married with 2 children, 3 grandchildren    Patient is retired Engineer, maintenance (IT)   Patient lives at [place] Strain: Not on file  Food Insecurity: Not on file  Transportation Needs: Not on file  Physical Activity: Not on file  Stress: Not on file  Social Connections: Not on file    Tobacco Counseling Counseling given: Not Answered   Clinical Intake:  Pre-visit preparation completed: Yes  Pain : No/denies pain     BMI - recorded: 28.4 Nutritional Status: BMI 25 -29 Overweight Nutritional Risks: None  How often do you need to have someone help you when you read instructions, pamphlets, or other written materials from your doctor or pharmacy?: 5 - Always  Diabetic?No  Interpreter Needed?: No  Information entered by :: [name] NP   Activities of Daily Living In your present state of health, do you have any difficulty performing the following activities: 07/05/2021 09/12/2020  Hearing? N N  Vision? N N  Difficulty concentrating or making decisions? Tempie Donning  Walking or climbing stairs? Y Y  Dressing or bathing? Y Y  Doing errands, shopping? Tempie Donning  Preparing Food and eating ? Y -  Using the Toilet? Y -  In the past six months, have you accidently leaked urine? Y -  Do you have problems with loss of bowel control? Y -  Managing your Medications? Y -  Managing your Finances? Y -  Housekeeping or managing your Housekeeping? Y -  Some recent data might be hidden    [name], [name] [name], MD as PCP - Cardiology (Cardiology)  Indicate any recent Medical Services you may have received from other  than [facility] providers in the past year (date may be approximate).     Assessment:   This is a  routine wellness examination for Peter Singh.  Hearing/Vision screen No results found.  Dietary issues and exercise activities discussed: Current Exercise Habits: Structured exercise class, Type of exercise: strength training/weights, Time (Minutes): 30, Frequency (Times/Week): 3, Weekly Exercise (Minutes/Week): 90, Intensity: Mild, Exercise limited by: cardiac condition(s)   Goals Addressed             This Visit's Progress    Social and Functional Skills Optimized       Evidence-based guidance:  Assess level of social support; promote maintaining links with family, friends and community to reduce social isolation.  Assess level of function related to basic activities of daily living that include eating, dressing, bathing, as well as instrumental activities of daily living such as shopping, managing finances and use of devices.  Encourage continuation of daily life components such as self-care, home maintenance, financial management, volunteer activities, education opportunities and hobbies.  Refer to occupational or physical therapy to develop comprehensive rehabilitation plan to improve or maintain activities of daily living; consider inclusion of endurance, balance and resistance-training.  Consider complementary therapy such as yoga, music, gardening, outdoor activities, aromatherapy and tai chi.   Notes:        Depression Screen PHQ 2/9 Scores 07/05/2021 09/12/2020 10/30/2019 06/23/2019  PHQ - 2 Score 0 0 - 0  Exception Documentation - - (No Data) -    Fall Risk Fall Risk  07/05/2021 09/12/2020 10/30/2019 06/23/2019 05/07/2019  Falls in the past year? 0 0 - 1 -  Number falls in past yr: 0 0 - 1 -  Injury with Fall? 0 0 - 1 -  Risk for fall due to : Impaired balance/gait - History of fall(s);Impaired balance/gait;Impaired mobility;Mental status change History of fall(s);Impaired  balance/gait;Impaired mobility History of fall(s);Impaired balance/gait;Impaired mobility;Mental status change;Medication side effect  Follow up Falls evaluation completed - Falls evaluation completed;Education provided;Falls prevention discussed Falls evaluation completed Falls evaluation completed;Education provided;Falls prevention discussed  Comment - - - [facility]    [place]:  Any stairs in or around the home? No  If so, are there any without handrails? No  Home free of loose throw rugs in walkways, pet beds, electrical cords, etc? Yes  Adequate lighting in your home to reduce risk of falls? Yes   ASSISTIVE DEVICES UTILIZED TO PREVENT FALLS:  Life alert? Yes  Use of a cane, walker or w/c? Yes  Grab bars in the bathroom? Yes  Shower chair or bench in shower? Yes  Elevated toilet seat or a handicapped toilet? Yes   TIMED UP AND GO:  Was the test performed? No .  Length of time to ambulate 10 feet: na sec.   Gait slow and steady with assistive device  Cognitive Function: MMSE - Mini Mental State Exam 06/23/2019 11/23/2017 11/05/2016  Orientation to time '1 2 5  ' Orientation to Place '4 5 5  ' Registration '3 3 3  ' Attention/ Calculation '5 5 5  ' Recall 0 2 1  Language- name 2 objects '2 2 2  ' Language- repeat '1 1 1  ' Language- follow 3 step command '3 3 3  ' Language- read & follow direction '1 1 1  ' Write a sentence 0 1 1  Copy design 0 1 1  Total score '20 26 28   ' Montreal Cognitive Assessment  07/26/2014  Visuospatial/ Executive (0/5) 3  Naming (0/3) 3  Attention: Read list of digits (0/2) 2  Attention: Read list of letters (0/1) 1  Attention: Serial 7 subtraction  starting at 100 (0/3) 3  Language: Repeat phrase (0/2) 2  Language : Fluency (0/1) 1  Abstraction (0/2) 2  Delayed Recall (0/5) 0  Orientation (0/6) 5  Total 22  Adjusted Score (based on education) 22   MMSE 08/26/20 24/30    Immunizations Immunization History   Administered Date(s) Administered   Influenza, High Dose Seasonal PF 05/04/2020, 04/10/2021   Influenza-Unspecified 04/17/2014, 05/01/2016, 04/15/2019   Moderna Covid-19 Vaccine Bivalent Booster 3yr & up 04/17/2021   Moderna SARS-COV2 Booster Vaccination 05/17/2020   Moderna Sars-Covid-2 Vaccination 07/12/2019, 08/09/2019   Pneumococcal Conjugate-13 04/20/2014   Pneumococcal Polysaccharide-23 07/08/2003, 03/31/2011   Pneumococcal-Unspecified 01/26/2004   Td 08/23/2007   Tdap 01/07/2012   Zoster Recombinat (Shingrix) 10/05/2017, 12/09/2017   Zoster, Live 07/17/2005    TDAP status: Up to date  Flu Vaccine status: Up to date  Pneumococcal vaccine status: Up to date  Covid-19 vaccine status: Completed vaccines  Qualifies for Shingles Vaccine? Yes   Zostavax completed Yes   Shingrix Completed?: Yes  Screening Tests Health Maintenance  Topic Date Due   COVID-19 Vaccine (3 - Moderna risk series) 09/05/2022 (Originally 04/17/2021)   TETANUS/TDAP  01/06/2022   Pneumonia Vaccine 87 Years old  Completed   INFLUENZA VACCINE  Completed   Zoster Vaccines- Shingrix  Completed   HPV VACCINES  Aged Out    Health Maintenance  There are no preventive care reminders to display for this patient.   Colorectal cancer screening: No longer required.   Lung Cancer Screening: (Low Dose CT Chest recommended if Age 85-80years, 30 pack-year currently smoking OR have quit w/in 15years.) does not qualify.   Lung Cancer Screening Referral: na  Additional Screening:  Hepatitis C Screening: does not qualify; Completed   Vision Screening: Recommended annual ophthalmology exams for early detection of glaucoma and other disorders of the eye. Is the patient up to date with their annual eye exam?   not sure will need to check with staff Who is the provider or what is the name of the office in which the patient attends annual eye exams?  If pt is not established with a provider, would they like  to be referred to a provider to establish care?  Has dementia will have to check with staff .   Dental Screening: Recommended annual dental exams for proper oral hygiene  Community Resource Referral / Chronic Care Management: CRR required this visit?  No   CCM required this visit?  No      Plan:     I have personally reviewed and noted the following in the patients chart:   Medical and social history Use of alcohol, tobacco or illicit drugs  Current medications and supplements including opioid prescriptions. Patient is not currently taking opioid prescriptions. Functional ability and status Nutritional status Physical activity Advanced directives List of other physicians Hospitalizations, surgeries, and ER visits in previous 12 months Vitals Screenings to include cognitive, depression, and falls Referrals and appointments  In addition, I have reviewed and discussed with patient certain preventive protocols, quality metrics, and best practice recommendations. A written personalized care plan for preventive services as well as general preventive health recommendations were provided to patient.     CRoyal Hawthorn NP   07/05/2021   Nurse Notes:

## 2021-07-05 NOTE — Patient Instructions (Signed)
[name] , Thank you for taking time to come for your Medicare Wellness Visit. I appreciate your ongoing commitment to your health goals. Please review the following plan we discussed and let me know if I can assist you in the future.   Screening recommendations/referrals: Colonoscopy aged out Recommended yearly ophthalmology/optometry visit for glaucoma screening and checkup Recommended yearly dental visit for hygiene and checkup  Vaccinations: Influenza vaccine up to date Pneumococcal vaccine up to date  Tdap vaccine up to date  Shingles vaccine up to date     Advanced directives: reviewed   Conditions/risks identified: fall risk but uses walker well   Next appointment: 1 year  Preventive Care 58 Years and Older, Male Preventive care refers to lifestyle choices and visits with your health care provider that can promote health and wellness. What does preventive care include? A yearly physical exam. This is also called an annual well check. Dental exams once or twice a year. Routine eye exams. Ask your health care provider how often you should have your eyes checked. Personal lifestyle choices, including: Daily care of your teeth and gums. Regular physical activity. Eating a healthy diet. Avoiding tobacco and drug use. Limiting alcohol use. Practicing safe sex. Taking low doses of aspirin every day. Taking vitamin and mineral supplements as recommended by your health care provider. What happens during an annual well check? The services and screenings done by your health care provider during your annual well check will depend on your age, overall health, lifestyle risk factors, and family history of disease. Counseling  Your health care provider may ask you questions about your: Alcohol use. Tobacco use. Drug use. Emotional well-being. Home and relationship well-being. Sexual activity. Eating habits. History of falls. Memory and ability to understand  (cognition). Work and work Statistician. Screening  You may have the following tests or measurements: Height, weight, and BMI. Blood pressure. Lipid and cholesterol levels. These may be checked every 5 years, or more frequently if you are over 45 years old. Skin check. Lung cancer screening. You may have this screening every year starting at age 39 if you have a 30-pack-year history of smoking and currently smoke or have quit within the past 15 years. Fecal occult blood test (FOBT) of the stool. You may have this test every year starting at age 73. Flexible sigmoidoscopy or colonoscopy. You may have a sigmoidoscopy every 5 years or a colonoscopy every 10 years starting at age 42. Prostate cancer screening. Recommendations will vary depending on your family history and other risks. Hepatitis C blood test. Hepatitis B blood test. Sexually transmitted disease (STD) testing. Diabetes screening. This is done by checking your blood sugar (glucose) after you have not eaten for a while (fasting). You may have this done every 1-3 years. Abdominal aortic aneurysm (AAA) screening. You may need this if you are a current or former smoker. Osteoporosis. You may be screened starting at age 83 if you are at high risk. Talk with your health care provider about your test results, treatment options, and if necessary, the need for more tests. Vaccines  Your health care provider may recommend certain vaccines, such as: Influenza vaccine. This is recommended every year. Tetanus, diphtheria, and acellular pertussis (Tdap, Td) vaccine. You may need a Td booster every 10 years. Zoster vaccine. You may need this after age 23. Pneumococcal 13-valent conjugate (PCV13) vaccine. One dose is recommended after age 34. Pneumococcal polysaccharide (PPSV23) vaccine. One dose is recommended after age 25. Talk to your health  care provider about which screenings and vaccines you need and how often you need them. This  information is not intended to replace advice given to you by your health care provider. Make sure you discuss any questions you have with your health care provider. Document Released: 07/20/2015 Document Revised: 03/12/2016 Document Reviewed: 04/24/2015 Elsevier Interactive Patient Education  2017 [place] Prevention in the Home Falls can cause injuries. They can happen to people of all ages. There are many things you can do to make your home safe and to help prevent falls. What can I do on the outside of my home? Regularly fix the edges of walkways and driveways and fix any cracks. Remove anything that might make you trip as you walk through a door, such as a raised step or threshold. Trim any bushes or trees on the path to your home. Use bright outdoor lighting. Clear any walking paths of anything that might make someone trip, such as rocks or tools. Regularly check to see if handrails are loose or broken. Make sure that both sides of any steps have handrails. Any raised decks and porches should have guardrails on the edges. Have any leaves, snow, or ice cleared regularly. Use sand or salt on walking paths during winter. Clean up any spills in your garage right away. This includes oil or grease spills. What can I do in the bathroom? Use night lights. Install grab bars by the toilet and in the tub and shower. Do not use towel bars as grab bars. Use non-skid mats or decals in the tub or shower. If you need to sit down in the shower, use a plastic, non-slip stool. Keep the floor dry. Clean up any water that spills on the floor as soon as it happens. Remove soap buildup in the tub or shower regularly. Attach bath mats securely with double-sided non-slip rug tape. Do not have throw rugs and other things on the floor that can make you trip. What can I do in the bedroom? Use night lights. Make sure that you have a light by your bed that is easy to reach. Do not use any sheets or  blankets that are too big for your bed. They should not hang down onto the floor. Have a firm chair that has side arms. You can use this for support while you get dressed. Do not have throw rugs and other things on the floor that can make you trip. What can I do in the kitchen? Clean up any spills right away. Avoid walking on wet floors. Keep items that you use a lot in easy-to-reach places. If you need to reach something above you, use a strong step stool that has a grab bar. Keep electrical cords out of the way. Do not use floor polish or wax that makes floors slippery. If you must use wax, use non-skid floor wax. Do not have throw rugs and other things on the floor that can make you trip. What can I do with my stairs? Do not leave any items on the stairs. Make sure that there are handrails on both sides of the stairs and use them. Fix handrails that are broken or loose. Make sure that handrails are as long as the stairways. Check any carpeting to make sure that it is firmly attached to the stairs. Fix any carpet that is loose or worn. Avoid having throw rugs at the top or bottom of the stairs. If you do have throw rugs, attach them to  the floor with carpet tape. Make sure that you have a light switch at the top of the stairs and the bottom of the stairs. If you do not have them, ask someone to add them for you. What else can I do to help prevent falls? Wear shoes that: Do not have high heels. Have rubber bottoms. Are comfortable and fit you well. Are closed at the toe. Do not wear sandals. If you use a stepladder: Make sure that it is fully opened. Do not climb a closed stepladder. Make sure that both sides of the stepladder are locked into place. Ask someone to hold it for you, if possible. Clearly mark and make sure that you can see: Any grab bars or handrails. First and last steps. Where the edge of each step is. Use tools that help you move around (mobility aids) if they are  needed. These include: Canes. Walkers. Scooters. Crutches. Turn on the lights when you go into a dark area. Replace any light bulbs as soon as they burn out. Set up your furniture so you have a clear path. Avoid moving your furniture around. If any of your floors are uneven, fix them. If there are any pets around you, be aware of where they are. Review your medicines with your doctor. Some medicines can make you feel dizzy. This can increase your chance of falling. Ask your doctor what other things that you can do to help prevent falls. This information is not intended to replace advice given to you by your health care provider. Make sure you discuss any questions you have with your health care provider. Document Released: 04/19/2009 Document Revised: 11/29/2015 Document Reviewed: 07/28/2014 Elsevier Interactive Patient Education  2017 [employer].

## 2021-07-08 DIAGNOSIS — Z7901 Long term (current) use of anticoagulants: Secondary | ICD-10-CM | POA: Diagnosis not present

## 2021-07-09 ENCOUNTER — Encounter: Payer: Self-pay | Admitting: Internal Medicine

## 2021-07-09 DIAGNOSIS — N390 Urinary tract infection, site not specified: Secondary | ICD-10-CM | POA: Diagnosis not present

## 2021-07-11 ENCOUNTER — Encounter: Payer: Self-pay | Admitting: Adult Health

## 2021-07-11 ENCOUNTER — Non-Acute Institutional Stay (SKILLED_NURSING_FACILITY): Payer: Medicare Other | Admitting: Adult Health

## 2021-07-11 DIAGNOSIS — N3000 Acute cystitis without hematuria: Secondary | ICD-10-CM

## 2021-07-11 DIAGNOSIS — I4811 Longstanding persistent atrial fibrillation: Secondary | ICD-10-CM

## 2021-07-11 NOTE — Progress Notes (Signed)
Location:   [facility] Room Number: 142 Place of Service:  SNF [phone]) Provider:  [name], NP   [name], MD  [name], [name] [name], MD as PCP - Cardiology (Cardiology)  Extended Emergency Contact Information Primary Emergency Contact: Plamondon,[name] Address: [address]          [place], [place] [address] [name] of [place] Phone: [phone] Mobile Phone: [phone] Relation: Spouse Secondary Emergency Contact: Ozdemir,Alakai Address: [place] [address] [name] of [place] Phone: [phone] Mobile Phone: [phone] Relation: Son  Code Status:  DNR Goals of care: Advanced Directive information Advanced Directives 07/11/2021  Does Patient Have a Medical Advance Directive? Yes  Type of Paramedic of [place];Living will;Out of facility DNR (pink MOST or yellow form)  Does patient want to make changes to medical advance directive? No - Patient declined  Copy of [place] in Chart? Yes - validated most recent copy scanned in chart (See row information)  Pre-existing out of facility DNR order (yellow form or pink MOST form) Yellow form placed in chart (order not valid for inpatient use);Pink MOST form placed in chart (order not valid for inpatient use)     Chief Complaint  Patient presents with   Acute Visit    UTI    HPI:  Pt is a 88 y.o. male seen today for an acute visit for UTI. He has a s/p cath due to urinary retention associated with BPH. He has dementia and can not provide a hx. He is able to answer questions. On 1/2 he had increased confusion, bladder tenderness, and bladder pain. The nurse changed the cath and obtained Urin C and S per orders. No UA. Result showed E coli >100,000 colonies and Klebsiella >100,000 colonies.   At this time he denies  any feelings of bladder pain and has no fever. His dementia has been worsening over the past year further complicating assessment.  Outputs recorded in matrix show documented as "large" sometimes and other times 200-300 cc. He is eating and drinking well.    Past Medical History:  Diagnosis Date   Adult failure to thrive    Per incoming records from [facility]   Allergic rhinitis    Per incoming records from [facility]   Atrial fibrillation [facility])    BPH (benign prostatic hyperplasia)    Per incoming records from [place] of kidney [facility])    Right, Per incoming records from [place]    Per incoming records from [facility]   Cerebrovascular accident, late effects    Per incoming records from [place]   Chronic kidney disease, stage III (moderate) ([place])    Per incoming records from [place] [facility])    Per [facility] from [facility]   Cognitive impairment    Mild, Per incoming records from [facility]   Constipation    Per incoming records from [facility]   Dementia [facility])    Diarrhea    Better off Aricept, Per incoming records from [facility]   Diastolic dysfunction    on 2D Echo 07/2009 and 2016, Per incoming records from [facility]   Diverticulosis    Mild,  Per incoming records from [facility]   Diverticulosis of colon (without mention of hemorrhage)    ED (erectile dysfunction)    Per incoming records from [facility]   History of Coumadin therapy    Per [facility]   History of echocardiogram 09/2014   Per incoming records from [facility]   Hyperlipemia    Hypertension    Kidney mass    Per incoming records from [place] leg edema    Per incoming records from [facility])    Neuropathy    Per incoming records from [facility]   OA (osteoarthritis)    Per incoming records from [facility]   Peripheral neuropathy    Per incoming records from [place] history of colonic [place] & 2004   adenomatous polyps   Pulmonary arterial hypertension ([place])    Per incoming records from [place]    Per incoming records from [facility]   Thrombocytopenia [facility]) 2017   Per incoming records from [facility]   UTI (urinary tract infection)    Sepsis, Per incoming records from [place] hypertrophy    Gilford Rile as ambulation aid    Per incoming records from [facility]   Past Surgical History:  Procedure Laterality Date   APPENDECTOMY     Per incoming records from [place]     Per incoming records from [facility]   IR [place]  11/21/2019   KNEE SURGERY     right   PILONIDAL CYST DRAINAGE     POLYPECTOMY     Per incoming records from [place] tumor lumbar spine     SPINE SURGERY     tumor removed   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     Allergies as of 07/11/2021       Reactions   Penicillins Rash   Has patient had a PCN reaction causing immediate rash, facial/tongue/throat swelling, SOB or lightheadedness with hypotension: unknown Has patient had a PCN reaction causing severe rash involving mucus membranes or skin necrosis: unknown Has patient had a PCN reaction that required hospitalization : unknown Has patient had a PCN reaction occurring within the last 10 years: no If all of the above answers are "NO", then may proceed with Cephalosporin use.        Medication List        Accurate as of July 11, 2021 10:21 AM. If you have any questions, ask your nurse or doctor.          acetaminophen 500 MG tablet Commonly known as: TYLENOL Take 500 mg by mouth 2 (two) times  daily. Scheduled and every 4 hours as needed (ending 05/05/2019)Document area of discomfort and effectiveness of medication.   atorvastatin 20 MG tablet Commonly known as: LIPITOR Take 20 mg by mouth every evening.   cholecalciferol 25 MCG (1000 UNIT) tablet Commonly known as: VITAMIN D3 Take 2,000 Units by mouth daily.   DULoxetine 20 MG capsule Commonly known as: CYMBALTA Take 20 mg by mouth daily.   furosemide 20 MG tablet Commonly known as: LASIX Take 20 mg by mouth. Once A Day on Tue, Thu, Sat   methenamine 1 g tablet Commonly known as: MANDELAMINE Take 1,000 mg by mouth 2 (two) times daily.   polyethylene glycol 17 g packet Commonly known as: MIRALAX / GLYCOLAX Take 17 g by mouth daily.   potassium chloride 10 MEQ CR capsule Commonly known as: MICRO-K Take 10 mEq by mouth. Once A Day on Tue, Thu, Sat   triamcinolone lotion 0.1 % Commonly known as: KENALOG Apply 1 application topically 2 (two) times daily as needed.   warfarin 4 MG tablet Commonly known as: COUMADIN Take 4 mg by mouth. Once A Day on Mon, Wed, Fri   warfarin 5 MG tablet Commonly known as:  COUMADIN Take 5 mg by mouth. Once A Day on Sun, Tue, Thu, Sat        Review of Systems  Constitutional:  Negative for activity change, appetite change, chills, diaphoresis, fatigue, fever and unexpected weight change.  Respiratory:  Negative for cough, shortness of breath, wheezing and stridor.   Cardiovascular:  Positive for leg swelling. Negative for chest pain and palpitations.  Gastrointestinal:  Negative for abdominal distention, abdominal pain, constipation and diarrhea.  Genitourinary:  Negative for decreased urine volume, difficulty urinating, dysuria, flank pain, frequency, penile discharge, penile pain, penile swelling and urgency.       Had bladder pain  Musculoskeletal:  Positive for gait problem. Negative for arthralgias, back pain, joint swelling and myalgias.  Neurological:  Negative for dizziness, seizures, syncope, facial asymmetry, speech difficulty, weakness and headaches.  Hematological:  Negative for adenopathy. Does not bruise/bleed easily.  Psychiatric/Behavioral:  Positive for confusion. Negative for agitation and behavioral problems.    Immunization History  Administered Date(s) Administered   Influenza, High Dose Seasonal PF 05/04/2020, 04/10/2021   Influenza-Unspecified 04/17/2014, 05/01/2016, 04/15/2019   Moderna Covid-19 Vaccine Bivalent Booster 79yrs & up 04/17/2021   Moderna SARS-COV2 Booster Vaccination 05/17/2020   Moderna Sars-Covid-2 Vaccination 07/12/2019, 08/09/2019   Pneumococcal Conjugate-13 04/20/2014   Pneumococcal Polysaccharide-23 07/08/2003, 03/31/2011   Pneumococcal-Unspecified 01/26/2004   Td 08/23/2007   Tdap 01/07/2012   Zoster Recombinat (Shingrix) 10/05/2017, 12/09/2017   Zoster, Live 07/17/2005   Pertinent  Health Maintenance Due  Topic Date Due   INFLUENZA VACCINE  Completed   Fall Risk 06/23/2019 10/30/2019 11/21/2019 09/12/2020 07/05/2021  Falls in the past year? 1 - - 0 0  Was there an injury with Fall? 1 - - 0 0  Fall Risk  Category Calculator 3 - - 0 0  Fall Risk Category High - - Low Low  Patient Fall Risk Level High fall risk - High fall risk Low fall risk Moderate fall risk  Patient at Risk for Falls Due to History of fall(s);Impaired balance/gait;Impaired mobility History of fall(s);Impaired balance/gait;Impaired mobility;Mental status change - - Impaired balance/gait  Fall risk Follow up Falls evaluation completed Falls evaluation completed;Education provided;Falls prevention discussed - - Falls evaluation completed  Fall risk Follow up - - - - -   Functional Status Survey:  Vitals:   07/11/21 1012  BP: 133/82  Pulse: (!) 51  Resp: 16  Temp: (!) 97.5 F (36.4 C)  SpO2: 96%  Weight: 261 lb 14.4 oz (118.8 kg)  Height: 6\' 7"  (2.007 m)   Body mass index is 29.5 kg/m. Physical Exam Vitals and nursing note reviewed.  Constitutional:      General: He is not in acute distress.    Appearance: He is not diaphoretic.  HENT:     Head: Normocephalic and atraumatic.  Neck:     Thyroid: No thyromegaly.     Vascular: No JVD.     Trachea: No tracheal deviation.  Cardiovascular:     Rate and Rhythm: Normal rate and regular rhythm.     Heart sounds: No murmur heard. Pulmonary:     Effort: Pulmonary effort is normal. No respiratory distress.     Breath sounds: Normal breath sounds. No wheezing.  Abdominal:     General: Bowel sounds are normal. There is no distension.     Palpations: Abdomen is soft.     Tenderness: There is abdominal tenderness (s/p area.). There is no right CVA tenderness or left CVA tenderness.  Genitourinary:    Penis: Normal.      Comments: Cath with clear yellow urine. Stoma area is pink and hypergranulation tissue noted.  Musculoskeletal:     Comments: BLE edema +1  Lymphadenopathy:     Cervical: No cervical adenopathy.  Skin:    General: Skin is warm and dry.  Neurological:     General: No focal deficit present.     Mental Status: He is alert. Mental status is at  baseline.     Cranial Nerves: No cranial nerve deficit.  Psychiatric:        Mood and Affect: Mood normal.    Labs reviewed: Recent Labs    03/14/21 0000 04/19/21 0000 05/25/21 0000  NA 138 142 144  K 4.0 4.0 4.1  CL 106 107 108  CO2 24* 25* 25*  BUN 20 14 18   CREATININE 1.0 1.2 1.1  CALCIUM 9.8 9.6 9.9   Recent Labs    03/14/21 0000 04/19/21 0000 05/25/21 0000  AST 12* 12* 11*  ALT 10 6* 5*  ALKPHOS 94 115 128*  ALBUMIN 3.3* 3.5 3.7   Recent Labs    01/14/21 0000 03/14/21 0000 05/25/21 0000  WBC 2.9 2.3 2.6  HGB 13.5 13.1* 12.4*  HCT 41 39* 37*  PLT 96* 113* 97*   Lab Results  Component Value Date   TSH 0.84 05/19/2019   Lab Results  Component Value Date   HGBA1C 6 08/22/2019   Lab Results  Component Value Date   CHOL 129 08/09/2020   HDL 32 (A) 08/09/2020   LDLCALC 80 08/09/2020   TRIG 86 08/09/2020    Significant Diagnostic Results in last 30 days:  No results found.  Assessment/Plan  1. Acute cystitis without hematuria There is no UA with this specimen to assess him more close for a UTI. He did have two symptoms at one point and did grow two specimens that are sensitive to cephalosporins so will go ahead and treat. I have asked the staff to flush the cath and assess for patency prior to calling for an order for a UA if there are symptoms of bladder pain.  Keflex 500 mg bid x 7 d  2. Longstanding persistent atrial fibrillation (HCC) Rate controlled On coumadin for CVA risk reduction. Will follow INR more closely while on  antibiotic.    Family/ staff Communication: resident and nurse   Labs/tests ordered:   INR in 4 days

## 2021-07-12 ENCOUNTER — Encounter: Payer: Self-pay | Admitting: Adult Health

## 2021-07-15 DIAGNOSIS — Z7901 Long term (current) use of anticoagulants: Secondary | ICD-10-CM | POA: Diagnosis not present

## 2021-07-22 ENCOUNTER — Encounter: Payer: Self-pay | Admitting: Internal Medicine

## 2021-07-22 DIAGNOSIS — Z7901 Long term (current) use of anticoagulants: Secondary | ICD-10-CM | POA: Diagnosis not present

## 2021-07-22 LAB — PROTIME-INR: INR: 2.4 — AB (ref 0.9–1.1)

## 2021-08-05 ENCOUNTER — Non-Acute Institutional Stay (SKILLED_NURSING_FACILITY): Payer: Medicare Other | Admitting: Internal Medicine

## 2021-08-05 DIAGNOSIS — D696 Thrombocytopenia, unspecified: Secondary | ICD-10-CM

## 2021-08-05 DIAGNOSIS — I5032 Chronic diastolic (congestive) heart failure: Secondary | ICD-10-CM | POA: Diagnosis not present

## 2021-08-05 DIAGNOSIS — F0150 Vascular dementia without behavioral disturbance: Secondary | ICD-10-CM | POA: Diagnosis not present

## 2021-08-05 DIAGNOSIS — I10 Essential (primary) hypertension: Secondary | ICD-10-CM | POA: Diagnosis not present

## 2021-08-05 DIAGNOSIS — N3090 Cystitis, unspecified without hematuria: Secondary | ICD-10-CM

## 2021-08-05 DIAGNOSIS — I4811 Longstanding persistent atrial fibrillation: Secondary | ICD-10-CM | POA: Diagnosis not present

## 2021-08-05 DIAGNOSIS — T83010D Breakdown (mechanical) of cystostomy catheter, subsequent encounter: Secondary | ICD-10-CM

## 2021-08-06 ENCOUNTER — Encounter: Payer: Self-pay | Admitting: Internal Medicine

## 2021-08-06 NOTE — Progress Notes (Signed)
Location:  Occupational psychologist of Service:  SNF (31)  Provider: [name]   Code Status: DNR Goals of Care:  Advanced Directives 07/11/2021  Does Patient Have a Medical Advance Directive? Yes  Type of Paramedic of [place];Living will;Out of facility DNR (pink MOST or yellow form)  Does patient want to make changes to medical advance directive? No - Patient declined  Copy of [place] in Chart? Yes - validated most recent copy scanned in chart (See row information)  Pre-existing out of facility DNR order (yellow form or pink MOST form) Yellow form placed in chart (order not valid for inpatient use);Pink MOST form placed in chart (order not valid for inpatient use)     Chief Complaint  Patient presents with   Medical Management of Chronic Issues    HPI: Patient is a 88 y.o. male seen today for medical management of chronic diseases.     Has h/o A Fi, Chronic Diastolic CHF, HLD  Thrombocytopenia, Has seen Dr Alen Blew in 03/2018 Told to follow  Right Renal Mass seen in [place] in 2018 H/o Urinary Retension s/p Suprapubic Cathter H/o Vascular Dementia MRI in 2020 Cortical Atrophy and Microvascular disease  He  is stable.No new Nursing issues. No Behavior issues Walks with his walker No Falls Has gained Weight not sure if right weights Wt Readings from Last 3 Encounters:  08/06/21 262 lb (118.8 kg)  07/11/21 261 lb 14.4 oz (118.8 kg)  07/05/21 252 lb 9.6 oz (114.6 kg)  His Main Issue continues to be Recurent UTIS due to Suprapubic Cathter HE was alert today Had no complains Pleasantly confused  Past Medical History:  Diagnosis Date   Adult failure to thrive    Per incoming records from [place]   Allergic rhinitis    Per incoming records from [facility]   Atrial fibrillation [facility])    BPH (benign prostatic hyperplasia)    Per incoming records from [place] of kidney [facility])    Right, Per incoming records from [place]    Per incoming records from [facility]   Cerebrovascular accident, late effects    Per incoming records from [place]   Chronic kidney disease, stage III (moderate) ([place])    Per incoming records from [facility])    Per incoming records from [facility]   Cognitive impairment    Mild, Per incoming records from [facility]   Constipation    Per incoming records from [facility]   Dementia [facility])    Diarrhea    Better off Aricept, Per incoming records from [facility]   Diastolic dysfunction    on 2D Echo 07/2009 and 2016, Per incoming records from [facility]   Diverticulosis    Mild, Per incoming records from [facility]   Diverticulosis of colon (without mention of hemorrhage)    ED (erectile dysfunction)    Per incoming records from [facility]   History of Coumadin therapy    Per incoming records from [facility]   History of echocardiogram 09/2014   Per incoming records from [facility]   Hyperlipemia    Hypertension    Kidney mass    Per incoming records from [facility]   Lower leg edema    Per incoming records from  [facility]   Melanoma [facility])    Neuropathy    Per incoming records from [place] (osteoarthritis)    Per [facility] records from [facility]   Peripheral neuropathy    Per incoming records from [place] history of colonic [place] & 2004   adenomatous polyps   Pulmonary arterial hypertension ([place])    Per incoming records from [place]    Per incoming records from [facility]   Thrombocytopenia  [facility]) 2017   Per incoming records from [facility]   UTI (urinary tract infection)    Sepsis, Per incoming records from [place] as ambulation aid    Per incoming records from [facility]    Past Surgical History:  Procedure Laterality Date   APPENDECTOMY     Per incoming records from [place]     Per incoming records from [place]  11/21/2019   KNEE SURGERY     right   PILONIDAL CYST DRAINAGE     POLYPECTOMY     Per incoming records from [place] tumor lumbar spine     SPINE SURGERY     tumor removed   THORACIC 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, Per incoming records from [place]      Allergies  Allergen Reactions   Penicillins Rash    Has patient had a PCN reaction causing immediate rash, facial/tongue/throat swelling, SOB or lightheadedness with hypotension: unknown Has patient had a PCN reaction causing severe rash involving mucus membranes or skin necrosis: unknown Has patient had a PCN reaction that required hospitalization : unknown Has patient had a PCN reaction occurring within the last 10 years: no If all of the above answers are "NO", then may proceed with Cephalosporin use.     Outpatient Encounter Medications as of 08/05/2021  Medication Sig   acetaminophen (TYLENOL) 500 MG tablet Take 500 mg by mouth 2 (two) times daily. Scheduled and every 4 hours as needed (ending 05/05/2019)Document area of discomfort and effectiveness of medication.   atorvastatin (LIPITOR) 20 MG tablet Take 20 mg by mouth every evening.   cholecalciferol (VITAMIN D3) 25 MCG (1000 UT) tablet Take 2,000 Units by mouth daily.    DULoxetine (CYMBALTA) 20 MG capsule Take 20 mg by mouth daily.   furosemide (LASIX)  20 MG tablet Take 20 mg by mouth. Once A Day on Tue, Thu, Sat   methenamine (MANDELAMINE) 1 g tablet Take 1,000 mg by mouth 2 (two) times daily.   polyethylene glycol (MIRALAX / GLYCOLAX) 17 g packet Take 17 g by mouth daily.   potassium chloride (MICRO-K) 10 MEQ CR capsule Take 10 mEq by mouth. Once A Day on Tue, Thu, Sat   triamcinolone lotion (KENALOG) 0.1 % Apply 1 application topically 2 (two) times daily as needed.    warfarin (COUMADIN) 4 MG tablet Take 4 mg by mouth. Once A Day on Mon, Wed, Fri   warfarin (COUMADIN) 5 MG tablet Take 5 mg by mouth. Once A Day on Sun, Tue, Thu, Sat   No facility-administered encounter medications on file as of 08/05/2021.    Review of Systems:  Review of Systems  Constitutional:  Negative for activity change, appetite  change and unexpected weight change.  HENT: Negative.    Respiratory:  Negative for cough and shortness of breath.   Cardiovascular:  Positive for leg swelling.  Gastrointestinal:  Negative for constipation.  Genitourinary:  Negative for frequency.  Musculoskeletal:  Negative for arthralgias, gait problem and myalgias.  Skin: Negative.  Negative for rash.  Neurological:  Negative for dizziness and weakness.  Psychiatric/Behavioral:  Positive for confusion. Negative for sleep disturbance.   All other systems reviewed and are negative.  Health Maintenance  Topic Date Due   COVID-19 Vaccine (3 - Moderna risk series) 09/05/2022 (Originally 04/17/2021)   TETANUS/TDAP  01/06/2022   Pneumonia Vaccine 55+ Years old  Completed   INFLUENZA VACCINE  Completed   Zoster Vaccines- Shingrix  Completed   HPV VACCINES  Aged Out    Physical Exam: Vitals:   08/06/21 1922  BP: 139/84  Pulse: (!) 58  Resp: 17  Temp: 97.6 F (36.4 C)  SpO2: 95%  Weight: 262 lb (118.8 kg)   Body mass index is 29.52 kg/m. Physical Exam Vitals reviewed.  Constitutional:      Appearance: Normal appearance.  HENT:     Head: Normocephalic.      Mouth/Throat:     Mouth: Mucous membranes are moist.     Pharynx: Oropharynx is clear.  Eyes:     Pupils: Pupils are equal, round, and reactive to light.  Cardiovascular:     Rate and Rhythm: Normal rate and regular rhythm.     Pulses: Normal pulses.     Heart sounds: No murmur heard. Pulmonary:     Effort: Pulmonary effort is normal. No respiratory distress.     Breath sounds: Normal breath sounds. No rales.  Abdominal:     General: Abdomen is flat. Bowel sounds are normal.     Palpations: Abdomen is soft.  Musculoskeletal:        General: Swelling present.     Cervical back: Neck supple.  Skin:    General: Skin is warm.  Neurological:     General: No focal deficit present.     Mental Status: He is alert.  Psychiatric:        Mood and Affect: Mood normal.        Thought Content: Thought content normal.    Labs reviewed: Basic Metabolic Panel: Recent Labs    03/14/21 0000 04/19/21 0000 05/25/21 0000  NA 138 142 144  K 4.0 4.0 4.1  CL 106 107 108  CO2 24* 25* 25*  BUN 20 14 18   CREATININE 1.0 1.2 1.1  CALCIUM 9.8 9.6 9.9   Liver Function Tests: Recent Labs    03/14/21 0000 04/19/21 0000 05/25/21 0000  AST 12* 12* 11*  ALT 10 6* 5*  ALKPHOS 94 115 128*  ALBUMIN 3.3* 3.5 3.7   No results for input(s): LIPASE, AMYLASE in the last 8760 hours. No results for input(s): AMMONIA in the last 8760 hours. CBC: Recent Labs    01/14/21 0000 03/14/21 0000 05/25/21 0000  WBC 2.9 2.3 2.6  HGB 13.5 13.1* 12.4*  HCT 41 39* 37*  PLT 96* 113* 97*   Lipid Panel: Recent Labs    08/09/20 0000  CHOL 129  HDL 32*  LDLCALC 80  TRIG 86   Lab Results  Component Value Date   HGBA1C 6 08/22/2019    Procedures since last visit: No results found.  Assessment/Plan Longstanding persistent atrial fibrillation (HCC) On Coumadin Suprapubic catheter With Recurrent Cystitis On Methenamine  Chronic  diastolic heart failure (HCC) On Low dose of Lasix Vascular dementia  without behavioral disturbance ([place]) Supportive care Thrombocytopenia [facility]) Has Seen Hematology before they recommended Surveillance and No further Work up [place] in 2018 did not show any splenomegaly  HLD On statin LDL 80 in 2/22     Labs/tests ordered:  * No order type specified *  [name], MD

## 2021-08-12 DIAGNOSIS — Z7901 Long term (current) use of anticoagulants: Secondary | ICD-10-CM | POA: Diagnosis not present

## 2021-08-27 LAB — PROTIME-INR: INR: 24 — AB (ref 0.9–1.1)

## 2021-08-30 DIAGNOSIS — N390 Urinary tract infection, site not specified: Secondary | ICD-10-CM | POA: Diagnosis not present

## 2021-09-02 ENCOUNTER — Non-Acute Institutional Stay (SKILLED_NURSING_FACILITY): Payer: Medicare Other | Admitting: Internal Medicine

## 2021-09-02 ENCOUNTER — Encounter: Payer: Self-pay | Admitting: Internal Medicine

## 2021-09-02 DIAGNOSIS — N3001 Acute cystitis with hematuria: Secondary | ICD-10-CM

## 2021-09-02 DIAGNOSIS — T83010D Breakdown (mechanical) of cystostomy catheter, subsequent encounter: Secondary | ICD-10-CM

## 2021-09-02 DIAGNOSIS — I4811 Longstanding persistent atrial fibrillation: Secondary | ICD-10-CM | POA: Diagnosis not present

## 2021-09-02 NOTE — Progress Notes (Signed)
Location:   [facility] Room Number: 142 Place of Service:  SNF [phone]) Provider:  Veleta Miners MD   [name], MD  [name], [name] [name], MD as PCP - Cardiology (Cardiology)  Extended Emergency Contact Information Primary Emergency Contact: Rossitto,[name] Address: [address]          [place], [place] [address] [name] of [place] Phone: [phone] Mobile Phone: [phone] Relation: Spouse Secondary Emergency Contact: Dues,Cong Address: [place] [address] [name] of [place] Phone: [phone] Mobile Phone: [phone] Relation: Son  Code Status:  DNR Goals of care: Advanced Directive information Advanced Directives 09/02/2021  Does Patient Have a Medical Advance Directive? Yes  Type of Advance Directive Out of facility DNR (pink MOST or yellow form);Living will  Does patient want to make changes to medical advance directive? No - Patient declined  Copy of [place] in Chart? -  Pre-existing out of facility DNR order (yellow form or pink MOST form) Pink MOST form placed in chart (order not valid for inpatient use)     Chief Complaint  Patient presents with   Acute Visit    UTI/coumadin    HPI:  Pt is a 88 y.o. male seen today for an acute visit for UTI and to adjust Coumadin  Has h/o A Fi, Chronic Diastolic CHF, HLD  Thrombocytopenia, Has seen Dr Alen Blew in 03/2018 Told to follow  Right Renal Mass seen in [place] in 2018 H/o Urinary Retension s/p Suprapubic Cathter H/o Vascular Dementia MRI in 2020 Cortical Atrophy and Microvascular disease  He had Urine tested as he was more confused according to the Floor Nurse and Wife was concerned It is hard to get much history from the patient as he has dementia He did not have fever Has Suprapubic Catheter. No Abdominal Pain or nausea   Urine came back positive with TNTC white cells and Red cells Urine culture showed Moderate Yeast Culture positive for more then 100 k of 2 Gram negative Not identified Was started on Cipro Did not have any acute complains today   Past Medical History:  Diagnosis Date   Adult failure to thrive    Per incoming records from [facility]   Allergic rhinitis    Per incoming records from [facility]   Atrial fibrillation [facility])    BPH (benign prostatic hyperplasia)    Per incoming records from [place] of kidney [facility])    Right, Per incoming records from [place]    Per incoming records from [facility]   Cerebrovascular accident, late effects    Per incoming records from [place]   Chronic kidney disease, stage III (moderate) ([place])    Per incoming records from [facility])    Per incoming records from [facility]   Cognitive impairment    Mild, Per incoming records from [facility]   Constipation    Per incoming records from [facility]   Dementia [facility])    Diarrhea    Better off Aricept, Per incoming records from [facility]   Diastolic dysfunction    on 2D Echo 07/2009 and 2016, Per incoming records from [facility]   Diverticulosis  Mild, Per incoming records from [facility]   Diverticulosis of colon (without mention of hemorrhage)    ED (erectile dysfunction)    Per incoming records from [facility]   History of Coumadin therapy    Per incoming records from [facility]   History of echocardiogram 09/2014   Per incoming records from [facility]   Hyperlipemia    Hypertension    Kidney mass    Per incoming records from [place] leg edema    Per  incoming records from [facility])    Neuropathy    Per incoming records from [facility]   OA (osteoarthritis)    Per incoming records from [facility]   Peripheral neuropathy    Per incoming records from [place] history of colonic [place] & 2004   adenomatous polyps   Pulmonary arterial hypertension ([place])    Per incoming records from [place]    Per incoming records from [facility]   Thrombocytopenia [facility]) 2017   Per incoming records from [facility]   UTI (urinary tract infection)    Sepsis, Per incoming records from [place] hypertrophy    [place] as ambulation aid    Per incoming records from [facility]   Past Surgical History:  Procedure Laterality Date   APPENDECTOMY     Per incoming records from [place]     Per incoming records from [facility]   IR [place]  11/21/2019   KNEE SURGERY     right   PILONIDAL CYST DRAINAGE     POLYPECTOMY     Per incoming records from [place] tumor lumbar spine     SPINE SURGERY     tumor removed   THORACIC 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, Per incoming records from [place]      Allergies  Allergen Reactions   Penicillins Rash    Has patient had a PCN reaction causing immediate rash, facial/tongue/throat swelling, SOB or lightheadedness with hypotension: unknown Has patient had a PCN reaction causing severe rash involving mucus membranes or skin necrosis: unknown Has patient had a PCN reaction that required hospitalization : unknown Has patient had a PCN reaction occurring within the last 10 years: no If all of the above answers are "NO", then  may proceed with Cephalosporin use.     Allergies as of 09/02/2021       Reactions   Penicillins Rash   Has patient had a PCN reaction causing immediate rash, facial/tongue/throat swelling, SOB or lightheadedness with hypotension: unknown Has patient had a PCN reaction causing severe rash involving mucus membranes or skin necrosis: unknown Has patient had a PCN reaction that required hospitalization : unknown Has patient had a PCN reaction occurring within the last 10 years: no If all of the above answers are "NO", then may proceed with Cephalosporin use.        Medication List        Accurate as of September 02, 2021 10:36 AM. If you have any questions, ask your nurse or doctor.          acetaminophen 500 MG tablet Commonly known as: TYLENOL Take 500 mg by mouth 2 (two)  times daily. Scheduled and every 4 hours as needed (ending 05/05/2019)Document area of discomfort and effectiveness of medication.   atorvastatin 20 MG tablet Commonly known as: LIPITOR Take 20 mg by mouth every evening.   cholecalciferol 25 MCG (1000 UNIT) tablet Commonly known as: VITAMIN D3 Take 2,000 Units by mouth daily.   ciprofloxacin 500 MG tablet Commonly known as: CIPRO Take 500 mg by mouth 2 (two) times daily.   DULoxetine 20 MG capsule Commonly known as: CYMBALTA Take 20 mg by mouth daily.   furosemide 20 MG tablet Commonly known as: LASIX Take 20 mg by mouth. Once A Day on Tue, Thu, Sat   methenamine 1 g tablet Commonly known as: MANDELAMINE Take 1,000 mg by mouth 2 (two) times daily.   polyethylene glycol 17 g packet Commonly known as: MIRALAX / GLYCOLAX Take 17 g by mouth daily.   potassium chloride 10 MEQ CR capsule Commonly known as: MICRO-K Take 10 mEq by mouth. Once A Day on Tue, Thu, Sat   saccharomyces boulardii 250 MG capsule Commonly known as: FLORASTOR Take 250 mg by mouth 2 (two) times daily.   triamcinolone lotion 0.1 % Commonly known as: KENALOG Apply 1  application topically 2 (two) times daily as needed.   warfarin 4 MG tablet Commonly known as: COUMADIN Take 4 mg by mouth. Once A Day on Mon, Wed, Fri   warfarin 5 MG tablet Commonly known as: COUMADIN Take 5 mg by mouth. Once A Day on Sun, Tue, Thu, Sat        Review of Systems  Unable to perform ROS: Dementia   Immunization History  Administered Date(s) Administered   Influenza, High Dose Seasonal PF 05/04/2020, 04/10/2021   Influenza-Unspecified 04/17/2014, 05/01/2016, 04/15/2019   Moderna Covid-19 Vaccine Bivalent Booster 70yr & up 04/17/2021   Moderna SARS-COV2 Booster Vaccination 05/17/2020   Moderna Sars-Covid-2 Vaccination 07/12/2019, 08/09/2019   Pneumococcal Conjugate-13 04/20/2014   Pneumococcal Polysaccharide-23 07/08/2003, 03/31/2011   Pneumococcal-Unspecified 01/26/2004   Td 08/23/2007   Tdap 01/07/2012   Zoster Recombinat (Shingrix) 10/05/2017, 12/09/2017   Zoster, Live 07/17/2005   Pertinent  Health Maintenance Due  Topic Date Due   INFLUENZA VACCINE  Completed   Fall Risk 06/23/2019 10/30/2019 11/21/2019 09/12/2020 07/05/2021  Falls in the past year? 1 - - 0 0  Was there an injury with Fall? 1 - - 0 0  Fall Risk Category Calculator 3 - - 0 0  Fall Risk Category High - - Low Low  Patient Fall Risk Level High fall risk - High fall risk Low fall risk Moderate fall risk  Patient at Risk for Falls Due to History of fall(s);Impaired balance/gait;Impaired mobility History of fall(s);Impaired balance/gait;Impaired mobility;Mental status change - - Impaired balance/gait  Fall risk Follow up Falls evaluation completed Falls evaluation completed;Education provided;Falls prevention discussed - - Falls evaluation completed  Fall risk Follow up - - - - -   Functional Status Survey:    Vitals:   09/02/21 1021  BP: 137/75  Pulse: 82  Resp: 18  Temp: 97.7 F (36.5 C)  SpO2: 98%  Weight: 265 lb 1.6 oz (120.2 kg)  Height: '6\' 7"'  (2.007 m)   Body mass index is  29.86 kg/m. Physical Exam Vitals reviewed.  Constitutional:      Appearance: Normal appearance.  HENT:     Head: Normocephalic.     Mouth/Throat:     Mouth: Mucous membranes are moist.     Pharynx: Oropharynx is clear.  Eyes:  Pupils: Pupils are equal, round, and reactive to light.  Cardiovascular:     Rate and Rhythm: Normal rate and regular rhythm.     Pulses: Normal pulses.     Heart sounds: No murmur heard. Pulmonary:     Effort: Pulmonary effort is normal. No respiratory distress.     Breath sounds: Normal breath sounds. No rales.  Abdominal:     General: Abdomen is flat. Bowel sounds are normal.     Palpations: Abdomen is soft.     Comments: Cathter site had no Discharge or Redness  Musculoskeletal:        General: No swelling.     Cervical back: Neck supple.  Skin:    General: Skin is warm.  Neurological:     General: No focal deficit present.     Mental Status: He is alert.  Psychiatric:        Mood and Affect: Mood normal.        Thought Content: Thought content normal.    Labs reviewed: Recent Labs    04/19/21 0000 05/25/21 0000 07/02/21 0000  NA 142 144 143  K 4.0 4.1 4.4  CL 107 108 106  CO2 25* 25* 25*  BUN '14 18 18  ' CREATININE 1.2 1.1 1.1  CALCIUM 9.6 9.9 10.3   Recent Labs    03/14/21 0000 04/19/21 0000 05/25/21 0000  AST 12* 12* 11*  ALT 10 6* 5*  ALKPHOS 94 115 128*  ALBUMIN 3.3* 3.5 3.7   Recent Labs    01/14/21 0000 03/14/21 0000 05/25/21 0000  WBC 2.9 2.3 2.6  HGB 13.5 13.1* 12.4*  HCT 41 39* 37*  PLT 96* 113* 97*   Lab Results  Component Value Date   TSH 0.84 05/19/2019   Lab Results  Component Value Date   HGBA1C 6 08/22/2019   Lab Results  Component Value Date   CHOL 129 08/09/2020   HDL 32 (A) 08/09/2020   LDLCALC 80 08/09/2020   TRIG 86 08/09/2020    Significant Diagnostic Results in last 30 days:  No results found.  Assessment/Plan Acute cystitis with hematuria UA TNTC white cell and RBC Also  Moderate Yeast Cutlture positive for more then 100 k of 2 Gramnegative Not identified ? Colonization Will do Cipro 5 days Diflucan for  5 days Check PT /INR  in 2 days Change Cathter Q monthly If Continues will need Urology Follow up   Other issues Longstanding persistent atrial fibrillation (HCC) On Coumadin Suprapubic catheter With Recurrent Cystitis On Methenamine   Chronic diastolic heart failure (HCC) On Low dose of Lasix Vascular dementia without behavioral disturbance ([place]) Supportive care Thrombocytopenia ([place]) Has Seen Hematology before they recommended Surveillance and No further Work up [place] in 2018 did not show any splenomegaly  HLD On statin LDL 80 in 2/22 Suprapubic catheter dysfunction, subsequent encounter Mildly Elevated Alk Phos Will need repeat labs     Family/ staff Communication:   Labs/tests ordered:

## 2021-09-04 DIAGNOSIS — Z7901 Long term (current) use of anticoagulants: Secondary | ICD-10-CM | POA: Diagnosis not present

## 2021-09-04 LAB — POCT INR: INR: 1.86 — AB (ref <=1.20)

## 2021-09-09 DIAGNOSIS — Z7901 Long term (current) use of anticoagulants: Secondary | ICD-10-CM | POA: Diagnosis not present

## 2021-09-09 LAB — POCT INR: INR: 3.19 — AB (ref <=1.20)

## 2021-09-26 ENCOUNTER — Encounter: Payer: Self-pay | Admitting: Adult Health

## 2021-09-26 ENCOUNTER — Non-Acute Institutional Stay (SKILLED_NURSING_FACILITY): Payer: Medicare Other | Admitting: Adult Health

## 2021-09-26 DIAGNOSIS — I4811 Longstanding persistent atrial fibrillation: Secondary | ICD-10-CM | POA: Diagnosis not present

## 2021-09-26 DIAGNOSIS — R4182 Altered mental status, unspecified: Secondary | ICD-10-CM | POA: Diagnosis not present

## 2021-09-26 DIAGNOSIS — E860 Dehydration: Secondary | ICD-10-CM

## 2021-09-26 DIAGNOSIS — Z9359 Other cystostomy status: Secondary | ICD-10-CM | POA: Diagnosis not present

## 2021-09-26 DIAGNOSIS — R400 Somnolence: Secondary | ICD-10-CM | POA: Diagnosis not present

## 2021-09-26 DIAGNOSIS — Z7901 Long term (current) use of anticoagulants: Secondary | ICD-10-CM | POA: Diagnosis not present

## 2021-09-26 DIAGNOSIS — F0150 Vascular dementia without behavioral disturbance: Secondary | ICD-10-CM

## 2021-09-26 DIAGNOSIS — R109 Unspecified abdominal pain: Secondary | ICD-10-CM | POA: Diagnosis not present

## 2021-09-26 LAB — COMPREHENSIVE METABOLIC PANEL
Albumin: 4 (ref 3.5–5.0)
Calcium: 9.8 (ref 8.7–10.7)
eGFR: 56

## 2021-09-26 LAB — CBC AND DIFFERENTIAL
HCT: 39 — AB (ref 41–53)
Hemoglobin: 12.9 — AB (ref 13.5–17.5)
Platelets: 88 10*3/uL — AB (ref 150–400)
WBC: 3.4

## 2021-09-26 LAB — BASIC METABOLIC PANEL
BUN: 24 — AB (ref 4–21)
CO2: 26 — AB (ref 13–22)
Chloride: 104 (ref 99–108)
Creatinine: 1.3 (ref 0.6–1.3)
Glucose: 127
Potassium: 4.1 mEq/L (ref 3.5–5.1)
Sodium: 140 (ref 137–147)

## 2021-09-26 LAB — HEPATIC FUNCTION PANEL
ALT: 6 U/L — AB (ref 10–40)
AST: 11 — AB (ref 14–40)
Alkaline Phosphatase: 100 (ref 25–125)
Bilirubin, Total: 1.7

## 2021-09-26 LAB — CBC: RBC: 4.6 (ref 3.87–5.11)

## 2021-09-26 MED ORDER — CEFTRIAXONE SODIUM 1 G IJ SOLR: 1.0000 g | INTRAMUSCULAR | 0 refills | Status: AC

## 2021-09-26 NOTE — Progress Notes (Signed)
?Location:  [place] ?Nursing Home Room Number: 353-I ?Place of Service:  SNF (31) ?Provider:  [name], NP  ? ?Patient Care Team: ?[name], [name] ?[name], MD as PCP - Cardiology (Cardiology) ? ?Extended Emergency Contact Information ?Primary Emergency Contact: Hartwell,[name] ?Address: [address] ?         [place], [place] [address] [place] ?Home Phone: [phone] ?Mobile Phone: [phone] ?Relation: Spouse ?Secondary Emergency Contact: Stegmann,Braxtin ?Address: [address] ?         [place] [address] [place] ?Home Phone: [phone] ?Mobile Phone: [phone] ?Relation: Son ? ?Code Status:  DNR ?Goals of care: Advanced Directive information ? ?  09/26/2021  ? 11:04 AM  ?Advanced Directives  ?Does Patient Have a Medical Advance Directive? Yes  ?Type of Advance Directive Living will;Out of facility DNR (pink MOST or yellow form)  ?Does patient want to make changes to medical advance directive? No - Patient declined  ?Pre-existing out of facility DNR order (yellow form or pink MOST form) Yellow form placed in chart (order not valid for inpatient use)  ? ? ? ?Chief Complaint  ?Patient presents with  ? Acute Visit  ?  Lethargy   ? ? ?HPI:  ?Pt is a 88 y.o. male seen today for an acute visit for lethargy. Nurse reports that on 3/22 Peter Singh had no urine in his catheter bag and the catheter was changed. As of this morning he is sleepy and weak. He did not eat breakfast. His back and side are hurting. No fever. No vomiting. He has a hx of BPH with retention and s/p cath. Also has a hx of recurrent UTI. Renal mass 3.8 cm x 2.2 cm on the right by CT no longer monitored due to goals of care.  ?Urine output recorded each shift 100-500 cc, sometimes charted as "large" ?Nurse also reports some black discoloration to the stoma of the s/p cath. LBM 3/22 ?He has vascular dementia that has  progressed over the past year with evening confusion. Remained ambulatory and pleasant prior to this issue. Now can follow commands but mostly sleeps and keeps his eyes closed. Did become agitated last evening.  ?Also has afib on coumadin for CVA risk reduction ?Hx of thrombocytopenia/neutropenia concern for MDS no longer follow by [name] due to goals of care.  ? ? ? ?Past Medical History:  ?Diagnosis Date  ? Adult failure to thrive   ? Per incoming records from [facility]  ? Allergic rhinitis   ? Per incoming records from [facility]  ? Atrial fibrillation ([place])   ? BPH (benign prostatic hyperplasia)   ? Per incoming records from [facility]  ? Cancer of kidney ([place])   ? Right, Per incoming records from [facility]  ? Cataract   ? Per incoming records from [facility]  ? Cerebrovascular accident, late effects   ? Per incoming records from [facility]  ? Chronic kidney disease, stage III (moderate) (HCC)   ? Per incoming records from [facility]  ? Claudication [facility])   ? Per incoming records from [facility]  ? Cognitive impairment   ? Mild, Per incoming records from [facility]  ? Constipation   ? Per incoming records from [facility]  ? Dementia ([place])   ? Diarrhea   ? Better off Aricept, Per incoming records  from [facility]  ? Diastolic dysfunction   ? on 2D Echo 07/2009 and 2016, Per incoming records from [facility]  ? Diverticulosis   ? Mild, Per incoming records from [facility]  ? Diverticulosis of colon (without mention of hemorrhage)   ? ED (erectile dysfunction)   ? Per incoming records from [facility]  ? History of Coumadin therapy   ? Per incoming records from [facility]  ? History of echocardiogram 09/2014  ? Per incoming records from [facility]  ? Hyperlipemia   ? Hypertension   ? Kidney mass   ? Per incoming records from [facility]  ? Lower leg edema   ? Per incoming records from [facility]  ? Melanoma ([place])   ? Neuropathy   ? Per incoming records from [facility]  ? OA (osteoarthritis)   ? Per incoming records from [facility]  ? Peripheral neuropathy   ? Per incoming records from [facility]  ? Personal history of colonic polyps 1999 & 2004  ? adenomatous polyps  ? Pulmonary arterial hypertension ([place])   ? Per incoming records from [facility]  ? Rosacea   ? Per incoming records from [facility]  ? Thrombocytopenia ([place]) 2017  ? Per incoming records from [facility]  ? UTI (urinary tract infection)   ? Sepsis, Per incoming records from [facility]  ? Ventricular hypertrophy   ? Walker as ambulation aid   ? Per incoming records from [facility]  ? ?Past Surgical History:  ?Procedure Laterality Date  ? APPENDECTOMY    ? Per incoming records from [facility]  ? CATARACT EXTRACTION    ? Per incoming records from [facility]  ? IR CATHETER TUBE CHANGE  11/21/2019  ? KNEE SURGERY    ? right  ? PILONIDAL CYST DRAINAGE    ? POLYPECTOMY    ? Per incoming records from [facility]  ? schwanoma tumor lumbar spine    ? SPINE SURGERY    ? tumor removed  ? THORACIC LAMINECTOMY    ? Secondary to Intradural extrmedullary tumor, Per incoming records from [facility]  ? TONSILLECTOMY AND ADENOIDECTOMY    ? ? ?Allergies  ?Allergen Reactions  ? Penicillins Rash  ?  Has patient had a PCN reaction causing immediate rash, facial/tongue/throat swelling, SOB or lightheadedness with hypotension: unknown ?Has patient had a PCN reaction causing severe rash involving mucus membranes or skin necrosis: unknown ?Has patient had a PCN reaction that  required hospitalization : unknown ?Has patient had a PCN reaction occurring within the last 10 years: no ?If all of the above answers are "NO", then may proceed with Cephalosporin use. ?  ? ? ?Outpatient Encounter Medications as of 09/26/2021  ?Medication Sig  ? acetaminophen (TYLENOL) 500 MG tablet Take 500 mg by mouth 2 (two) times daily. Scheduled and every 4 hours as needed (ending 05/05/2019)Document area of discomfort and effectiveness of medication.  ? atorvastatin (LIPITOR) 20 MG tablet Take 20 mg by mouth every evening.  ? cholecalciferol (VITAMIN D3) 25 MCG (1000 UT) tablet Take 2,000 Units by mouth daily.   ? DULoxetine (CYMBALTA) 20 MG capsule Take 20 mg by mouth daily.  ? furosemide (LASIX) 20 MG tablet Take 20 mg by mouth. Once A Day on Tue, Thu, Sat  ? methenamine (MANDELAMINE) 1 g tablet Take 1,000 mg by mouth  2 (two) times daily.  ? polyethylene glycol (MIRALAX / GLYCOLAX) 17 g packet Take 17 g by mouth daily.  ? potassium chloride (MICRO-K) 10 MEQ CR capsule Take 10 mEq by mouth. Once A Day on Tue, Thu, Sat  ? triamcinolone lotion (KENALOG) 0.1 % Apply 1 application topically 2 (two) times daily as needed.   ? warfarin (COUMADIN) 4 MG tablet Take 4 mg by mouth. Once A Day on Mon, Wed, Fri  ? warfarin (COUMADIN) 5 MG tablet Take 5 mg by mouth. Once A Day on Sun, Tue, Thu, Sat  ? [DISCONTINUED] saccharomyces boulardii (FLORASTOR) 250 MG capsule Take 250 mg by mouth 2 (two) times daily.  ? ?No facility-administered encounter medications on file as of 09/26/2021.  ? ? ?Review of Systems  ?Constitutional:  Positive for activity change, appetite change and fatigue.  ?HENT:  Negative for congestion, facial swelling, rhinorrhea and trouble swallowing.   ?Eyes:  Negative for visual disturbance.  ?Respiratory:  Negative for cough, shortness of breath and wheezing.   ?Cardiovascular:  Positive for leg swelling. Negative for chest pain and palpitations.  ?Gastrointestinal:  Positive for abdominal distention  (resolved with cath replacement 3/22). Negative for abdominal pain, anal bleeding, blood in stool, constipation, diarrhea, nausea, rectal pain and vomiting.  ?Genitourinary:  Positive for decreased urine volume, flank pain an

## 2021-09-27 ENCOUNTER — Encounter: Payer: Self-pay | Admitting: Adult Health

## 2021-09-27 ENCOUNTER — Non-Acute Institutional Stay (SKILLED_NURSING_FACILITY): Payer: Medicare Other | Admitting: Adult Health

## 2021-09-27 DIAGNOSIS — N12 Tubulo-interstitial nephritis, not specified as acute or chronic: Secondary | ICD-10-CM

## 2021-09-27 DIAGNOSIS — Z7901 Long term (current) use of anticoagulants: Secondary | ICD-10-CM | POA: Diagnosis not present

## 2021-09-27 DIAGNOSIS — D696 Thrombocytopenia, unspecified: Secondary | ICD-10-CM | POA: Diagnosis not present

## 2021-09-27 DIAGNOSIS — N19 Unspecified kidney failure: Secondary | ICD-10-CM | POA: Diagnosis not present

## 2021-09-27 DIAGNOSIS — R400 Somnolence: Secondary | ICD-10-CM | POA: Diagnosis not present

## 2021-09-27 NOTE — Progress Notes (Signed)
?Location:   [place] ?Nursing Home Room Number: 675 ?Place of Service:  SNF (31) ?Provider:  [name], NP  ? ?[name], MD ? ?Patient Care Team: ?[name], [name] ?[name], MD as PCP - Cardiology (Cardiology) ? ?Extended Emergency Contact Information ?Primary Emergency Contact: Ammon,[name] ?Address: [address] ?         [place], [place] [address] [place] ?Home Phone: [phone] ?Mobile Phone: [phone] ?Relation: Spouse ?Secondary Emergency Contact: Mowrey,Trevion ?Address: [address] ?         [place] [address] [place] ?Home Phone: [phone] ?Mobile Phone: [phone] ?Relation: Son ? ?Code Status:  DNR ?Goals of care: Advanced Directive information ? ?  09/27/2021  ?  9:17 AM  ?Advanced Directives  ?Does Patient Have a Medical Advance Directive? Yes  ?Type of Advance Directive Living will;Out of facility DNR (pink MOST or yellow form)  ?Does patient want to make changes to medical advance directive? No - Patient declined  ?Pre-existing out of facility DNR order (yellow form or pink MOST form) Yellow form placed in chart (order not valid for inpatient use)  ? ? ? ?Chief Complaint  ?Patient presents with  ? Medical Management of Chronic Issues  ?  f/u regarding altered mental status   ? ? ?HPI:  ?Pt is a 88 y.o. male seen today for f/u regarding UTI and altered mental status. He has a hx of BPH with retention and s/p cath. Also has a hx of recurrent UTI. Renal mass 3.8 cm x 2.2 cm on the right by CT no longer monitored due to goals of care. Currently on coumadin for CVA risk reduction associated with afib. INR 2.24 ?He was started on Rocephin for 5 days 3/23 due to back pain, pyuria, and altered mental status. He received 500 cc bolus of NS due to decreased intake. His wife agreed to avoid hospitalizations. His s/p cath has been changed due to difficulty draining and  f/u PVR 3/23 was negative. ?For my visit today he is more alert and denies any pain. No fever and is more mentally clear. He does have underlying dementia which is progressing. He was able to eat some breakfast and walk to the dining room ?UA Nitrite +1 leuk esterase 500 WBC 120 few bacteria RBC 67 protein 200 ?BMP NA 140 K 4.1 BUN 24 CR 1.25 CBC WBC 3.4 Hgb 12.9 Plt 88  ?Urine out put 450 on day shift 3/24 ?200 on night shift 3/24 ?Past Medical History:  ?Diagnosis Date  ? Adult failure to thrive   ? Per incoming records from [facility]  ? Allergic rhinitis   ? Per incoming records from [name]  ? Atrial fibrillation ([place])   ? BPH (benign prostatic hyperplasia)   ? Per incoming records from [facility]  ? Cancer of kidney ([place])   ? Right, Per incoming records from [facility]  ? Cataract   ? Per incoming records from [facility]  ? Cerebrovascular accident, late effects   ? Per incoming records from [facility]  ? Chronic kidney disease, stage III (moderate) (HCC)   ? Per incoming records from [facility]  ? Claudication [facility])   ? Per incoming records from [facility]  ? Cognitive impairment   ? Mild, Per incoming records from [facility]  ? Constipation   ? Per  [facility]coming [facility]  ? Dementia ([place])   ? Diarrhea   ? Better off Aricept, Per incoming records from [facility]  ? Diastolic dysfunction   ? on 2D Echo 07/2009 and 2016, Per incoming records from [facility]  ? Diverticulosis   ? Mild, Per incoming records from [facility]  ? Diverticulosis of colon (without mention of hemorrhage)   ? ED (erectile dysfunction)   ? Per [facility] records [facility]  ? History of Coumadin therapy   ? Per incoming records from [facility]  ? History of echocardiogram  09/2014  ? Per incoming records from [facility]  ? Hyperlipemia   ? Hypertension   ? Kidney mass   ? Per incoming records from [facility]  ? Lower leg edema   ? Per incoming records from [facility]  ? Melanoma ([place])   ? Neuropathy   ? Per incoming records from [facility]  ? OA (osteoarthritis)   ? Per incoming records from [facility]  ? Peripheral neuropathy   ? Per incoming records from [facility]  ? Personal history of colonic polyps 1999 & 2004  ? adenomatous polyps  ? Pulmonary arterial hypertension ([place])   ? Per incoming records from [facility]  ? Rosacea   ? Per incoming records from [facility]  ? Thrombocytopenia ([place]) 2017  ? Per incoming records from [facility]  ? UTI (urinary tract infection)   ? Sepsis, Per incoming records from [facility]  ? Ventricular hypertrophy   ? Walker as ambulation aid   ? Per incoming records from [facility]  ? ?Past Surgical History:  ?Procedure Laterality Date  ? APPENDECTOMY    ? Per incoming records from [facility]  ? CATARACT EXTRACTION    ? Per incoming records [facility]  ? IR CATHETER TUBE CHANGE  11/21/2019  ? KNEE SURGERY    ? right  ? PILONIDAL CYST DRAINAGE    ? POLYPECTOMY    ? Per incoming records from [facility]  ? schwanoma tumor lumbar spine    ? SPINE SURGERY    ? tumor removed  ? THORACIC LAMINECTOMY    ? Secondary to Intradural extrmedullary tumor, Per incoming records from [facility]  ? TONSILLECTOMY AND ADENOIDECTOMY    ? ? ?Allergies  ?Allergen Reactions  ? Penicillins Rash  ?  Has patient had a PCN reaction causing immediate rash, facial/tongue/throat swelling, SOB or lightheadedness with hypotension: unknown ?Has patient had a PCN reaction causing severe rash involving mucus membranes or skin  necrosis: unknown ?Has patient had a PCN reaction that required hospitalization : unknown ?Has patient had a PCN reaction occurring within the last 10 years: no ?If all of the above answers are "NO", then may proceed with Cephalosporin use. ?  ? ? ?Allergies as of 09/27/2021   ? ?   Reactions  ? Penicillins Rash  ? Has patient had a PCN reaction causing immediate rash, facial/tongue/throat swelling, SOB or lightheadedness with hypotension: unknown ?Has patient had a PCN reaction causing severe rash involving mucus membranes or skin necrosis: unknown ?Has patient had a PCN reaction that required hospitalization : unknown ?Has patient had a PCN reaction occurring within the last 10 years: no ?If all of the above answers are "NO", then may proceed with Cephalosporin use.  ? ?  ? ?  ?  Medication List  ?  ? ?  ? Accurate as of September 27, 2021  9:18 AM. If you have any questions, ask your nurse or doctor.  ?  ?  ? ?  ? ?acetaminophen 500 MG tablet ?Commonly known as: TYLENOL ?Take 500 mg by mouth 2 (two) times daily. Scheduled and every 4 hours as needed (ending 05/05/2019)Document area of discomfort and effectiveness of medication. ?  ?atorvastatin 20 MG tablet ?Commonly known as: LIPITOR ?Take 20 mg by mouth every evening. ?  ?cefTRIAXone 1 g injection ?Commonly known as: ROCEPHIN ?Inject 1 g into the muscle daily for 4 days. ?  ?cholecalciferol 25 MCG (1000 UNIT) tablet ?Commonly known as: VITAMIN D3 ?Take 2,000 Units by mouth daily. ?  ?DULoxetine 20 MG capsule ?Commonly known as: CYMBALTA ?Take 20 mg by mouth daily. ?  ?furosemide 20 MG tablet ?Commonly known as: LASIX ?Take 20 mg by mouth. Once A Day on Tue, Thu, Sat ?  ?methenamine 1 g tablet ?Commonly known as: MANDELAMINE ?Take 1,000 mg by mouth 2 (two) times daily. ?  ?polyethylene glycol 17 g packet ?Commonly known as: MIRALAX / GLYCOLAX ?Take 17 g by mouth daily. ?  ?potassium chloride 10 MEQ CR capsule ?Commonly known as: MICRO-K ?Take 10 mEq by mouth. Once A  Day on Tue, Thu, Sat ?  ?sodium chloride 0.9 % infusion ?Inject into the vein. Every shift ?  ?triamcinolone lotion 0.1 % ?Commonly known as: KENALOG ?Apply 1 application topically 2 (two) times daily as needed.

## 2021-09-28 ENCOUNTER — Encounter: Payer: Self-pay | Admitting: Internal Medicine

## 2021-09-28 DIAGNOSIS — Z7901 Long term (current) use of anticoagulants: Secondary | ICD-10-CM | POA: Diagnosis not present

## 2021-09-28 LAB — PROTIME-INR: INR: 2.69 — AB (ref 0.80–1.20)

## 2021-10-28 DIAGNOSIS — Z7901 Long term (current) use of anticoagulants: Secondary | ICD-10-CM | POA: Diagnosis not present

## 2021-10-28 LAB — POCT INR: INR: 2.8 — AB (ref 0.80–1.20)

## 2021-10-29 ENCOUNTER — Encounter: Payer: Self-pay | Admitting: Orthopedic Surgery

## 2021-10-29 ENCOUNTER — Non-Acute Institutional Stay (SKILLED_NURSING_FACILITY): Payer: Medicare Other | Admitting: Orthopedic Surgery

## 2021-10-29 DIAGNOSIS — I5032 Chronic diastolic (congestive) heart failure: Secondary | ICD-10-CM | POA: Diagnosis not present

## 2021-10-29 DIAGNOSIS — E782 Mixed hyperlipidemia: Secondary | ICD-10-CM | POA: Diagnosis not present

## 2021-10-29 DIAGNOSIS — R338 Other retention of urine: Secondary | ICD-10-CM

## 2021-10-29 DIAGNOSIS — I4811 Longstanding persistent atrial fibrillation: Secondary | ICD-10-CM

## 2021-10-29 DIAGNOSIS — Z9359 Other cystostomy status: Secondary | ICD-10-CM | POA: Diagnosis not present

## 2021-10-29 DIAGNOSIS — R17 Unspecified jaundice: Secondary | ICD-10-CM

## 2021-10-29 DIAGNOSIS — N2889 Other specified disorders of kidney and ureter: Secondary | ICD-10-CM

## 2021-10-29 DIAGNOSIS — D696 Thrombocytopenia, unspecified: Secondary | ICD-10-CM | POA: Diagnosis not present

## 2021-10-29 DIAGNOSIS — F0150 Vascular dementia without behavioral disturbance: Secondary | ICD-10-CM | POA: Diagnosis not present

## 2021-10-29 DIAGNOSIS — N12 Tubulo-interstitial nephritis, not specified as acute or chronic: Secondary | ICD-10-CM | POA: Diagnosis not present

## 2021-10-29 DIAGNOSIS — N401 Enlarged prostate with lower urinary tract symptoms: Secondary | ICD-10-CM | POA: Diagnosis not present

## 2021-10-29 DIAGNOSIS — I10 Essential (primary) hypertension: Secondary | ICD-10-CM

## 2021-10-29 NOTE — Progress Notes (Signed)
?Location:  [facility] ?Nursing Home Room Number: 142/A ?Place of Service:  SNF (31) ?Provider: Yvonna Alanis, NP ? ?Patient Care Team: ?[name], [name] ?[name], MD as PCP - Cardiology (Cardiology) ? ?Extended Emergency Contact Information ?Primary Emergency Contact: Nordby,[name] ?Address: [address] ?         [place], [place] [address] [place] ?Home Phone: [phone] ?Mobile Phone: [phone] ?Relation: Spouse ?Secondary Emergency Contact: Gores,Haneef ?Address: [address] ?         [place] [address] [place] ?Home Phone: [phone] ?Mobile Phone: [phone] ?Relation: Son ? ?Code Status:  DNR  ?Goals of care: Advanced Directive information ? ?  10/29/2021  ? 11:18 AM  ?Advanced Directives  ?Does Patient Have a Medical Advance Directive? Yes  ?Type of Advance Directive Living will;Out of facility DNR (pink MOST or yellow form)  ?Does patient want to make changes to medical advance directive? No - Patient declined  ?Pre-existing out of facility DNR order (yellow form or pink MOST form) Yellow form placed in chart (order not valid for inpatient use)  ? ? ? ?Chief Complaint  ?Patient presents with  ? Medical Management of Chronic Issues  ?  Routine visit.  ? ? ?HPI:  ?Pt is a 88 y.o. male seen today for medical management of chronic diseases.   ? ?He currently resides on the skilled nursing unit at [facility] PMH: atrial fibrillation, CHF, HTN, HLD, PVD, constipation, vascular dementia, urinary retention, and knee pain.  ? ?Pyelonephritis- 03/23 symptoms of back pain/pyuria and AMS, given rocephin x 5 days and 500 cc fluid bolus, 03/23 culture suspected contamination, U/A positive for leukocytes/nitrates/blood  ?Elevated bilirubin- 1.7, AST/ALT 11/6 09/26/2021, denies abdominal pain/N/V ?Vascular dementia- MMSE 24/30 08/2020, no behavioral outbursts ?HTN- BUN/creat 24/1.25 09/26/2021, off  HCTZ, remains on lasix  ?CHF- LVEF 55-60% 2016, no weight fluctuations or sob, some ankle edema, remains on lasix ?HLD- LDL 80 08/2020, remains on statin ?Afib- HR controlled without medication, remains on coumadin for clot prevention ?Renal mass- Renal U/S 2018- mass 2.9 x 2.4 x 2.1 cm, renal U/S 09/27/2021- mass 4.8 x 4.4 x 3.9 cm ?Urinary retention- suprapubic catheter, remains on methenamine ?Thrombocytopenia- platelets 88 09/2021, seen by hematology in past- surveillance recommended ? ?No recent falls or injuries. Ambulates with walker.  ? ?Recent blood pressures: ? 04/24- 140/70 ? 04/19- 158/86, 143/70 ? 04/18- 144/71 ? ?Recent weights: ? 04/01- 267 lbs ? 03/01- 269 lbs ? 02/01- 265.1 lbs ? ? ? ? ?Past Medical History:  ?Diagnosis Date  ? Adult failure to thrive   ? Per incoming records from [facility]  ? Allergic rhinitis   ? Per incoming records from [facility]  ? Atrial fibrillation ([place])   ? BPH (benign prostatic hyperplasia)   ? Per incoming records from [facility]  ? Cancer of kidney ([place])   ? Right, Per incoming records from [facility]  ? Cataract   ? Per incoming records from [facility]  ? Cerebrovascular accident, late effects   ? Per incoming records from [facility]  ? Chronic kidney disease, stage III (moderate) (HCC)   ? Per incoming records from [facility]  ? Claudication [facility])   ? Per incoming records from [facility]  ? Cognitive impairment   ? Mild, Per incoming records from [facility]  ? Constipation   ?  Per incoming records from [facility]  ? Dementia ([place])   ? Diarrhea   ? Better off Aricept, Per incoming records from [facility]  ? Diastolic dysfunction   ? on 2D Echo 07/2009 and 2016, Per incoming records from [facility]  ? Diverticulosis   ? Mild, Per incoming records from [facility]  ? Diverticulosis of colon (without mention of hemorrhage)   ? ED (erectile dysfunction)   ? Per incoming records from [facility]  ? History of Coumadin therapy   ? Per incoming records from [facility]  ? History of echocardiogram 09/2014  ? Per incoming records from [facility]  ? Hyperlipemia   ? Hypertension   ? Kidney mass   ? Per incoming records from [facility]  ? Lower leg edema   ? [facility]  ? Melanoma ([place])   ? Neuropathy   ? Per incoming records from [facility]  ? OA (osteoarthritis)   ? Per incoming records from [facility]  ? Peripheral neuropathy   ? Per incoming records from [facility]  ? Personal history of colonic polyps 1999 & 2004  ? adenomatous polyps  ? Pulmonary arterial hypertension ([place])   ? Per incoming records from [facility]  ? Rosacea   ? Per incoming records from [facility]  ? Thrombocytopenia ([place]) 2017  ? Per incoming records from [facility]  ? UTI (urinary tract infection)   ? Sepsis, Per incoming records from [facility]  ? Ventricular hypertrophy   ? Walker as ambulation aid   ? Per incoming records from [facility]  ? ?Past Surgical History:  ?Procedure Laterality Date  ? APPENDECTOMY    ? Per incoming records from [facility]  ? CATARACT EXTRACTION    ? Per incoming records from [facility]  ? IR CATHETER TUBE CHANGE  11/21/2019  ? KNEE SURGERY    ? right  ? PILONIDAL CYST DRAINAGE    ? POLYPECTOMY    ? Per incoming records from [facility]  ? schwanoma tumor lumbar spine    ? SPINE SURGERY    ? tumor removed  ? THORACIC LAMINECTOMY    ? Secondary to Intradural extrmedullary tumor, Per incoming records from [facility]  ? TONSILLECTOMY AND ADENOIDECTOMY     ? ? ?Allergies  ?Allergen Reactions  ? Penicillins Rash  ?  Has patient had a PCN reaction causing immediate rash, facial/tongue/throat swelling, SOB or lightheadedness with hypotension: unknown ?Has patient had a PCN reaction causing severe rash involving mucus membranes or skin necrosis: unknown ?Has patient had a PCN reaction that required hospitalization : unknown ?Has patient had a PCN reaction occurring within the last 10 years: no ?If all of the above answers are "NO", then may proceed with Cephalosporin use. ?  ? ? ?Outpatient Encounter Medications as of 10/29/2021  ?Medication Sig  ? acetaminophen (TYLENOL) 500 MG tablet Take 500 mg by mouth 2 (two) times daily. Scheduled and every 4 hours as needed (ending 05/05/2019)Document area of discomfort and effectiveness of medication.  ? atorvastatin (LIPITOR) 20 MG tablet Take 20 mg by mouth every evening.  ? cholecalciferol (VITAMIN D3) 25 MCG (1000 UT) tablet Take 2,000 Units by mouth daily.   ? DULoxetine (CYMBALTA) 20 MG capsule Take 20 mg by mouth daily.  ? furosemide (LASIX) 20 MG tablet  Take 20 mg by mouth. Once A Day on Tue, Thu, Sat  ? methenamine (MANDELAMINE) 1 g tablet Take 1,000 mg by mouth 2 (two) times daily.  ? polyethylene glycol (MIRALAX / GLYCOLAX) 17 g packet Take 17 g by mouth daily.  ? potassium chloride (MICRO-K) 10 MEQ CR capsule Take 10 mEq by mouth. Once A Day on Tue, Thu, Sat  ? triamcinolone lotion (KENALOG) 0.1 % Apply 1 application topically 2 (two) times daily as needed.   ? warfarin (COUMADIN) 4 MG tablet Take 4 mg by mouth. Once A Day on Mon, Wed, Fri  ? warfarin (COUMADIN) 5 MG tablet Take 5 mg by mouth. Once A Day on Sun, Tue, Thu, Sat  ? [DISCONTINUED] sodium chloride 0.9 % infusion Inject into the vein. Every shift  ? ?No facility-administered encounter medications on file as of 10/29/2021.  ? ? ?Review of Systems  ?Unable to perform ROS: Dementia  ? ?Immunization History  ?Administered Date(s) Administered  ? Influenza, High  Dose Seasonal PF 05/04/2020, 04/10/2021  ? Influenza-Unspecified 04/17/2014, 05/01/2016, 04/15/2019  ? Moderna Covid-19 Vaccine Bivalent Booster 30yr & up 04/17/2021  ? Moderna SARS-COV2 Booster Vaccination 11/11/

## 2021-10-30 DIAGNOSIS — Z9189 Other specified personal risk factors, not elsewhere classified: Secondary | ICD-10-CM | POA: Diagnosis not present

## 2021-11-03 DIAGNOSIS — Z23 Encounter for immunization: Secondary | ICD-10-CM | POA: Diagnosis not present

## 2021-11-25 DIAGNOSIS — Z7901 Long term (current) use of anticoagulants: Secondary | ICD-10-CM | POA: Diagnosis not present

## 2021-11-25 LAB — POCT INR: INR: 2.12 — AB (ref 0.80–1.20)

## 2021-11-28 ENCOUNTER — Non-Acute Institutional Stay (SKILLED_NURSING_FACILITY): Payer: Medicare Other | Admitting: Adult Health

## 2021-11-28 ENCOUNTER — Encounter: Payer: Self-pay | Admitting: Adult Health

## 2021-11-28 DIAGNOSIS — N401 Enlarged prostate with lower urinary tract symptoms: Secondary | ICD-10-CM | POA: Diagnosis not present

## 2021-11-28 DIAGNOSIS — I10 Essential (primary) hypertension: Secondary | ICD-10-CM | POA: Diagnosis not present

## 2021-11-28 DIAGNOSIS — D696 Thrombocytopenia, unspecified: Secondary | ICD-10-CM

## 2021-11-28 DIAGNOSIS — R230 Cyanosis: Secondary | ICD-10-CM

## 2021-11-28 DIAGNOSIS — R338 Other retention of urine: Secondary | ICD-10-CM | POA: Diagnosis not present

## 2021-11-28 DIAGNOSIS — F0150 Vascular dementia without behavioral disturbance: Secondary | ICD-10-CM

## 2021-11-28 DIAGNOSIS — I4811 Longstanding persistent atrial fibrillation: Secondary | ICD-10-CM

## 2021-11-28 DIAGNOSIS — N2889 Other specified disorders of kidney and ureter: Secondary | ICD-10-CM | POA: Diagnosis not present

## 2021-11-28 NOTE — Progress Notes (Signed)
Location:  [facility] Room Number: 142-A Place of Service:  SNF [phone]) Provider:  Wyvonna Plum, MD  [name], [name] [name], MD as PCP - Cardiology (Cardiology)  Extended Emergency Contact Information Primary Emergency Contact: Verdone,[name] Address: [address]          [place], [place] [address] [name] of [place] Phone: [phone] Mobile Phone: [phone] Relation: Spouse Secondary Emergency Contact: Feuerborn,Acheron Address: [place] [address] [name] of [place] Phone: [phone] Mobile Phone: [phone] Relation: Son  Code Status:  DNR Goals of care: Advanced Directive information    11/28/2021   10:01 AM  Advanced Directives  Does Patient Have a Medical Advance Directive? Yes  Type of Advance Directive Out of facility DNR (pink MOST or yellow form)  Does patient want to make changes to medical advance directive? No - Patient declined  Pre-existing out of facility DNR order (yellow form or pink MOST form) Pink MOST form placed in chart (order not valid for inpatient use)     Chief Complaint  Patient presents with   Routine    HPI:  Pt is a 88 y.o. male seen today for medical management of chronic diseases.    Vascular dementia: no issues with behaviors. Still ambulatory but moving slower, more confused over the past year.   HX Renal mass-  CT 2018- right kidney mass 2.9 x 2.4 x 2.1 cm, renal U/S 09/27/2021- right kidney mass 4.8 x 4.4 x 3.9 cm  Hx of thrombocytopenia and leukopenia with questionable MDS per [name], no longer followed due to goals of care Plt 88,000.   HTN: BP 130-150s range.   Has cold hands, fingers turn blue and white after showers. No sob or cough. Long term issue. No ulcers.   Hx of afib on coumadin. Not on meds for rate control  Past Medical  History:  Diagnosis Date   Adult failure to thrive    Per incoming records from [facility]   Allergic rhinitis    Per incoming records [facility])    BPH (benign prostatic hyperplasia)    Per incoming records from [place] of kidney [facility])    Right, Per incoming records from [place]    Per [facility] records from [facility]   Cerebrovascular accident, late effects    Per incoming records from [place] kidney disease, stage III (moderate) ([place])    Per incoming records from [facility])    Per incoming records from [facility]   Cognitive impairment    Mild, Per incoming records from [facility]   Constipation    Per incoming records from [facility]   Dementia [facility])    Diarrhea    Better off Aricept, Per incoming records from [facility]   Diastolic dysfunction    on 2D Echo 07/2009 and 2016, Per incoming records from [facility]   Diverticulosis    Mild, Per incoming records from [facility]   Diverticulosis of colon (without mention of hemorrhage)    ED (erectile dysfunction)    Per incoming records from [facility]   History of Coumadin therapy  Per incoming records [facility]   History of echocardiogram 09/2014   Per incoming records from [facility]   Hyperlipemia    Hypertension    Kidney mass    Per incoming records from [place] leg edema    Per incoming records from [facility])    Neuropathy    Per incoming records from [place] (osteoarthritis)    Per incoming records from [facility]   Peripheral neuropathy     Per incoming records from [place] history of colonic [place] & 2004   adenomatous polyps   Pulmonary arterial hypertension ([place])    Per incoming records from [place]    Per incoming records from [facility]   Thrombocytopenia [facility]) 2017   Per incoming records from [facility]   UTI (urinary tract infection)    Sepsis, Per incoming records from [place] as ambulation aid    Per incoming records from [facility]   Past Surgical History:  Procedure Laterality Date   APPENDECTOMY     Per incoming records from [place]     Per incoming records from [place]  11/21/2019   KNEE SURGERY     right   PILONIDAL CYST DRAINAGE     POLYPECTOMY     Per incoming records from [place] tumor lumbar spine     SPINE SURGERY     tumor removed   THORACIC 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, Per incoming records from [place]      Allergies  Allergen Reactions   Penicillins Rash    Has patient had a PCN reaction causing immediate rash, facial/tongue/throat swelling, SOB or lightheadedness with hypotension: unknown Has patient had a PCN reaction causing severe rash involving mucus membranes or skin necrosis: unknown Has patient had a PCN reaction that required hospitalization : unknown Has patient had a PCN reaction occurring within the last 10 years: no If all of the above answers are "NO", then may proceed with Cephalosporin use.     Outpatient Encounter Medications as of 11/28/2021  Medication Sig   acetaminophen (TYLENOL) 500 MG tablet Take 500 mg by mouth 2 (two) times daily. Scheduled and every 4 hours as needed (ending  05/05/2019)Document area of discomfort and effectiveness of medication.   atorvastatin (LIPITOR) 20 MG tablet Take 20 mg by mouth every evening.   cholecalciferol (VITAMIN D3) 25 MCG (1000 UT) tablet Take 2,000 Units by mouth daily.    DULoxetine (CYMBALTA) 20 MG capsule Take 20 mg by mouth daily.   furosemide (LASIX) 20 MG tablet Take 20 mg by mouth. Once A Day on Tue, Thu, Sat   methenamine (MANDELAMINE) 1 g tablet Take 1,000 mg by mouth 2 (two) times daily.   polyethylene glycol (MIRALAX / GLYCOLAX) 17 g packet Take 17 g by mouth daily.   potassium chloride (MICRO-K) 10 MEQ CR capsule Take 10 mEq by mouth. Once A Day on Tue, Thu, Sat   triamcinolone lotion (KENALOG) 0.1 % Apply 1 application topically 2 (two) times daily as needed.    warfarin (COUMADIN) 4 MG tablet Take 4 mg by mouth. Once  A Day on Mon, Wed, Fri   warfarin (COUMADIN) 5 MG tablet Take 5 mg by mouth. Once A Day on Sun, Tue, Thu, Sat   No facility-administered encounter medications on file as of 11/28/2021.    Review of Systems  Constitutional:  Negative for activity change, appetite change, chills, diaphoresis, fatigue, fever and unexpected weight change.  Respiratory:  Negative for cough, shortness of breath, wheezing and stridor.   Cardiovascular:  Positive for leg swelling. Negative for chest pain and palpitations.  Gastrointestinal:  Negative for abdominal distention, abdominal pain, constipation and diarrhea.  Genitourinary:  Negative for difficulty urinating and dysuria.  Musculoskeletal:  Positive for gait problem. Negative for arthralgias, back pain, joint swelling and myalgias.  Skin:  Positive for color change.  Neurological:  Negative for dizziness, seizures, syncope, facial asymmetry, speech difficulty, weakness and headaches.  Hematological:  Negative for adenopathy. Does not bruise/bleed easily.  Psychiatric/Behavioral:  Negative for agitation, behavioral problems and confusion.    Immunization History   Administered Date(s) Administered   Influenza, High Dose Seasonal PF 05/04/2020, 04/10/2021   Influenza-Unspecified 04/17/2014, 05/01/2016, 04/15/2019   Moderna Covid-19 Vaccine Bivalent Booster 21yr & up 04/17/2021   Moderna SARS-COV2 Booster Vaccination 05/17/2020   Moderna Sars-Covid-2 Vaccination 07/12/2019, 08/09/2019   Pneumococcal Conjugate-13 04/20/2014   Pneumococcal Polysaccharide-23 07/08/2003, 03/31/2011   Pneumococcal-Unspecified 01/26/2004   Td 08/23/2007   Tdap 01/07/2012   Zoster Recombinat (Shingrix) 10/05/2017, 12/09/2017   Zoster, Live 07/17/2005   Pertinent  Health Maintenance Due  Topic Date Due   INFLUENZA VACCINE  02/04/2022      06/23/2019   10:13 AM 10/30/2019    8:03 AM 11/21/2019    1:46 PM 09/12/2020    9:24 AM 07/05/2021   11:21 AM  Fall Risk  Falls in the past year? 1   0 0  Was there an injury with Fall? 1   0 0  Fall Risk Category Calculator 3   0 0  Fall Risk Category High   Low Low  Patient Fall Risk Level High fall risk  High fall risk Low fall risk Moderate fall risk  Patient at Risk for Falls Due to History of fall(s);Impaired balance/gait;Impaired mobility History of fall(s);Impaired balance/gait;Impaired mobility;Mental status change   Impaired balance/gait  Fall risk Follow up Falls evaluation completed Falls evaluation completed;Education provided;Falls prevention discussed   Falls evaluation completed   Functional Status Survey:    Vitals:   11/28/21 0948  BP: (!) 155/71  Pulse: (!) 58  Resp: 15  Temp: (!) 97 F (36.1 C)  SpO2: 98%  Weight: 260 lb 11.2 oz (118.3 kg)  Height: '6\' 7"'$  (2.007 m)   Body mass index is 29.37 kg/m. Physical Exam Vitals and nursing note reviewed.  Constitutional:      General: He is not in acute distress.    Appearance: He is not diaphoretic.  HENT:     Head: Normocephalic and atraumatic.  Neck:     Thyroid: No thyromegaly.     Vascular: No JVD.     Trachea: No tracheal deviation.   Cardiovascular:     Rate and Rhythm: Normal rate and regular rhythm.     Heart sounds: No murmur heard. Pulmonary:     Effort: Pulmonary effort is normal. No respiratory distress.     Breath sounds: Normal breath sounds. No wheezing.  Abdominal:     General: Bowel sounds are normal. There is no distension.     Palpations: Abdomen is soft.  Tenderness: There is no abdominal tenderness.  Musculoskeletal:     Cervical back: Normal range of motion and neck supple.     Comments: Edema +1 BLE  Lymphadenopathy:     Cervical: No cervical adenopathy.  Skin:    General: Skin is warm and dry.     Comments: Bluish discoloration to finger tips of both hands.   Neurological:     Mental Status: He is alert and oriented to person, place, and time.     Cranial Nerves: No cranial nerve deficit.    Labs reviewed: Recent Labs    05/25/21 0000 07/02/21 0000 09/26/21 0000  NA 144 143 140  K 4.1 4.4 4.1  CL 108 106 104  CO2 25* 25* 26*  BUN 18 18 24*  CREATININE 1.1 1.1 1.3  CALCIUM 9.9 10.3 9.8   Recent Labs    04/19/21 0000 05/25/21 0000 09/26/21 0000  AST 12* 11* 11*  ALT 6* 5* 6*  ALKPHOS 115 128* 100  ALBUMIN 3.5 3.7 4.0   Recent Labs    03/14/21 0000 05/25/21 0000 09/26/21 0000  WBC 2.3 2.6 3.4  HGB 13.1* 12.4* 12.9*  HCT 39* 37* 39*  PLT 113* 97* 88*   Lab Results  Component Value Date   TSH 0.84 05/19/2019   Lab Results  Component Value Date   HGBA1C 6 08/22/2019   Lab Results  Component Value Date   CHOL 129 08/09/2020   HDL 32 (A) 08/09/2020   LDLCALC 80 08/09/2020   TRIG 86 08/09/2020    Significant Diagnostic Results in last 30 days:  No results found.  Assessment/Plan  Vascular dementia without behavioral disturbance ([place]) Continue supportive care in the skilled setting Needs MMSE  Urinary retention due to benign prostatic hyperplasia Has s/p cath  no new issues.   Atrial fibrillation ([place]) On coumadin with monthly INRs for CVA risk  reduction  Lab Results  Component Value Date   INR 2.12 (A) 11/25/2021   INR 2.80 (A) 10/28/2021   INR 2.69 (A) 09/28/2021   PROTIME 24.4 (A) 09/27/2020   PROTIME 24.6 (A) 08/30/2020   PROTIME 25.4 (A) 08/23/2020     Thrombocytopenia ([place]) Labs done in march need to be abstracted. Has remains stable No aggressive work up  Renal mass, right Mass has likely grown in size but he does not have symptoms Would not pursue further due to goals of care.   Cyanosis of fingertip Occurs after showers. ?raynauds. Could try gloves as he is having issues with dexterity when cold.   Essential hypertension Controlled ON lasix Needs new compression hose   Labs from march to be abstracted.

## 2021-11-28 NOTE — Assessment & Plan Note (Signed)
Continue supportive care in the skilled setting Needs MMSE

## 2021-11-28 NOTE — Assessment & Plan Note (Signed)
Labs done in march need to be abstracted. Has remains stable No aggressive work up

## 2021-11-28 NOTE — Assessment & Plan Note (Addendum)
On coumadin with monthly INRs for CVA risk reduction  Lab Results  Component Value Date   INR 2.12 (A) 11/25/2021   INR 2.80 (A) 10/28/2021   INR 2.69 (A) 09/28/2021   PROTIME 24.4 (A) 09/27/2020   PROTIME 24.6 (A) 08/30/2020   PROTIME 25.4 (A) 08/23/2020

## 2021-11-28 NOTE — Assessment & Plan Note (Signed)
Mass has likely grown in size but he does not have symptoms Would not pursue further due to goals of care.

## 2021-11-28 NOTE — Assessment & Plan Note (Signed)
Has s/p cath  no new issues.

## 2021-11-28 NOTE — Assessment & Plan Note (Signed)
Controlled ON lasix Needs new compression hose

## 2021-11-28 NOTE — Assessment & Plan Note (Signed)
Occurs after showers. ?raynauds. Could try gloves as he is having issues with dexterity when cold.

## 2021-12-04 ENCOUNTER — Encounter: Payer: Self-pay | Admitting: Internal Medicine

## 2021-12-04 DIAGNOSIS — R4182 Altered mental status, unspecified: Secondary | ICD-10-CM | POA: Diagnosis not present

## 2021-12-05 ENCOUNTER — Encounter: Payer: Self-pay | Admitting: Adult Health

## 2021-12-05 ENCOUNTER — Non-Acute Institutional Stay (SKILLED_NURSING_FACILITY): Payer: Medicare Other | Admitting: Adult Health

## 2021-12-05 DIAGNOSIS — N3000 Acute cystitis without hematuria: Secondary | ICD-10-CM | POA: Diagnosis not present

## 2021-12-05 DIAGNOSIS — Z9359 Other cystostomy status: Secondary | ICD-10-CM

## 2021-12-05 NOTE — Progress Notes (Unsigned)
Location:  [facility] Room Number: 142-A Place of Service:  SNF [id number]) Provider:  [name], NP   [name], [name] [name], MD as PCP - Cardiology (Cardiology)  Extended Emergency Contact Information Primary Emergency Contact: Heckart,[name] Address: [address]          [place], [place] [address] [name] of [place] Phone: [phone] Mobile Phone: [phone] Relation: Spouse Secondary Emergency Contact: Blea,Burke Address: [place] [address] [name] of [place] Phone: [phone] Mobile Phone: [phone] Relation: Son  Code Status:  DNR Goals of care: Advanced Directive information    12/05/2021   11:48 AM  Advanced Directives  Does Patient Have a Medical Advance Directive? Yes  Type of Advance Directive Out of facility DNR (pink MOST or yellow form);Living will  Does patient want to make changes to medical advance directive? No - Patient declined  Pre-existing out of facility DNR order (yellow form or pink MOST form) Pink MOST form placed in chart (order not valid for inpatient use);Yellow form placed in chart (order not valid for inpatient use)     Chief Complaint  Patient presents with   Acute Visit    UTI concerns     HPI:  Pt is a 88 y.o. male seen today for an acute visit for UTI.  Nurse reported drainage from s/p site and increased confusion. UA C and S  12/04/21 obtained which showed 3+ bacteria, 500 leuk esterase, 51-100 WBC 21-50 RBC 2+ blood neg nitrite turbid clarity.  He was not having fever or back pain. Eating and drinking well Has a hx of recurrent UTI, BPH with urinary retention with s/p cath.  On methenamine for prevention.  On coumadin for afib for CVA risk reduction     Past Medical History:  Diagnosis Date   Adult failure to thrive    Per incoming records from [facility]   Allergic rhinitis    Per incoming records from [facility]   Atrial fibrillation [facility])    BPH (benign prostatic hyperplasia)    Per incoming records from [place] of kidney [facility])    Right, Per incoming records from [place]    Per incoming records from [facility]   Cerebrovascular accident, late effects    Per incoming records from [place] kidney disease, stage III (moderate) ([place])    Per incoming records from [place] [facility])    Per incoming records from [facility]   Cognitive impairment    Mild, Per incoming records from [facility]   Constipation    Per incoming records from [facility]   Dementia [facility])    Diarrhea    Better off Aricept, Per incoming records from [facility]   Diastolic dysfunction    on 2D Echo 07/2009 and 2016, Per incoming records from [facility]   Diverticulosis    Mild, Per incoming records from [facility]   Diverticulosis of colon (without mention of hemorrhage)    ED (erectile dysfunction)    Per incoming records from [facility]   History of Coumadin therapy    Per incoming records from [facility]   History of echocardiogram 09/2014   Per  incoming records from [facility]   Hyperlipemia    Hypertension    Kidney mass    Per incoming records from [place] leg edema    Per incoming records from [place] [facility])    Neuropathy    Per incoming records from [place] (osteoarthritis)    Per incoming records from [facility]   Peripheral neuropathy    Per incoming records from [place] history of colonic [place] & 2004   adenomatous polyps   Pulmonary arterial hypertension ([place])    Per incoming records from [place]    Per incoming records from [facility]   Thrombocytopenia [facility]) 2017   Per incoming records from [facility]   UTI (urinary tract infection)    Sepsis, Per incoming records from [place] as ambulation aid    Per incoming records from [facility]   Past Surgical History:  Procedure Laterality Date   APPENDECTOMY     Per incoming records from [place]     Per incoming records from [place]  11/21/2019   KNEE SURGERY     right   PILONIDAL CYST DRAINAGE     POLYPECTOMY     Per incoming records from [place] tumor lumbar spine     SPINE SURGERY     tumor removed   THORACIC 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, Per incoming records from [place]      Allergies  Allergen Reactions   Penicillins Rash    Has patient had a PCN reaction causing immediate rash, facial/tongue/throat swelling, SOB or lightheadedness with hypotension: unknown Has patient had a PCN reaction causing severe rash involving mucus membranes or skin necrosis: unknown Has patient had a PCN reaction that required hospitalization : unknown Has patient had a PCN reaction occurring within the last 10 years: no If all of the above answers are "NO", then may proceed with Cephalosporin use.     Outpatient Encounter Medications as of 12/05/2021  Medication Sig   acetaminophen (TYLENOL) 500 MG tablet Take 500 mg by mouth 2 (two) times daily. Scheduled and every 4 hours as needed (ending 05/05/2019)Document area of discomfort and effectiveness of medication.   atorvastatin (LIPITOR) 20 MG  tablet Take 20 mg by mouth every evening.   cefpodoxime (VANTIN) 100 MG tablet Take 100 mg by mouth 2 (two) times daily. For UTI   cholecalciferol (VITAMIN D3) 25 MCG (1000 UT) tablet Take 2,000 Units by mouth daily.    DULoxetine (CYMBALTA) 20 MG capsule Take 20 mg by mouth daily.   furosemide (LASIX) 20 MG tablet Take 20 mg by mouth. Once A Day on Tue, Thu, Sat   methenamine (MANDELAMINE) 1 g tablet Take 1,000 mg by mouth 2 (two) times daily.   polyethylene glycol (MIRALAX / GLYCOLAX) 17 g packet Take 17 g by mouth daily.   potassium chloride (MICRO-K) 10 MEQ CR capsule Take 10 mEq by mouth. Once A Day on Tue, Thu, Sat   triamcinolone lotion (KENALOG) 0.1 % Apply 1 application topically 2 (two) times daily as needed.    warfarin (COUMADIN) 4 MG tablet Take 4 mg by  mouth. Once A Day on Mon, Wed, Fri   warfarin (COUMADIN) 5 MG tablet Take 5 mg by mouth. Once A Day on Sun, Tue, Thu, Sat   No facility-administered encounter medications on file as of 12/05/2021.    Review of Systems  Constitutional:  Negative for activity change, appetite change, chills, diaphoresis, fatigue, fever and unexpected weight change.  Respiratory:  Negative for cough, shortness of breath, wheezing and stridor.   Cardiovascular:  Positive for leg swelling. Negative for chest pain and palpitations.  Gastrointestinal:  Negative for abdominal distention, abdominal pain, constipation and diarrhea.  Genitourinary:  Negative for decreased urine volume, difficulty urinating, dysuria, flank pain and frequency.       S/p site with drainage  Musculoskeletal:  Positive for gait problem. Negative for arthralgias, back pain, joint swelling and myalgias.  Neurological:  Negative for dizziness, seizures, syncope, facial asymmetry, speech difficulty, weakness and headaches.  Hematological:  Negative for adenopathy. Does not bruise/bleed easily.  Psychiatric/Behavioral:  Positive for confusion. Negative for agitation and behavioral  problems.    Immunization History  Administered Date(s) Administered   Influenza, High Dose Seasonal PF 05/04/2020, 04/10/2021   Influenza-Unspecified 04/17/2014, 05/01/2016, 04/15/2019   Moderna Covid-19 Vaccine Bivalent Booster 31yr & up 04/17/2021   Moderna SARS-COV2 Booster Vaccination 05/17/2020   Moderna Sars-Covid-2 Vaccination 07/12/2019, 08/09/2019   Pneumococcal Conjugate-13 04/20/2014   Pneumococcal Polysaccharide-23 07/08/2003, 03/31/2011   Pneumococcal-Unspecified 01/26/2004   Td 08/23/2007   Tdap 01/07/2012   Zoster Recombinat (Shingrix) 10/05/2017, 12/09/2017   Zoster, Live 07/17/2005   Pertinent  Health Maintenance Due  Topic Date Due   INFLUENZA VACCINE  02/04/2022      06/23/2019   10:13 AM 10/30/2019    8:03 AM 11/21/2019    1:46 PM 09/12/2020    9:24 AM 07/05/2021   11:21 AM  Fall Risk  Falls in the past year? 1   0 0  Was there an injury with Fall? 1   0 0  Fall Risk Category Calculator 3   0 0  Fall Risk Category High   Low Low  Patient Fall Risk Level High fall risk  High fall risk Low fall risk Moderate fall risk  Patient at Risk for Falls Due to History of fall(s);Impaired balance/gait;Impaired mobility History of fall(s);Impaired balance/gait;Impaired mobility;Mental status change   Impaired balance/gait  Fall risk Follow up Falls evaluation completed Falls evaluation completed;Education provided;Falls prevention discussed   Falls evaluation completed   Functional Status Survey:    Vitals:   12/05/21 1144  BP: 138/76  Pulse: (!) 58  Resp: 18  Temp: (!) 97.4 F (36.3 C)  SpO2: 96%  Weight: 262 lb (118.8 kg)  Height: '6\' 7"'$  (2.007 m)   Body mass index is 29.52 kg/m. Physical Exam Vitals and nursing note reviewed.  Constitutional:      Appearance: Normal appearance.  HENT:     Head: Normocephalic and atraumatic.  Cardiovascular:     Rate and Rhythm: Bradycardia present. Rhythm irregular.     Heart sounds: No murmur heard. Pulmonary:      Effort: Pulmonary effort is normal.     Breath sounds: Normal breath sounds.  Abdominal:     General: Bowel sounds are normal. There is no distension.     Palpations: Abdomen is soft. There is no mass.     Tenderness: There is no abdominal tenderness. There is no right CVA tenderness or guarding.     Hernia: No hernia is present.  Genitourinary:  Comments: S/p site with hypergranulation tissue with small amt of bloody/purulent drainage. Small area of black tissue noted. No tenderness. No redness.  Musculoskeletal:     Comments: BLE edema +2  Skin:    General: Skin is warm and dry.  Neurological:     General: No focal deficit present.     Mental Status: He is alert. Mental status is at baseline.  Psychiatric:        Mood and Affect: Mood normal.    Labs reviewed: Recent Labs    05/25/21 0000 07/02/21 0000 09/26/21 0000  NA 144 143 140  K 4.1 4.4 4.1  CL 108 106 104  CO2 25* 25* 26*  BUN 18 18 24*  CREATININE 1.1 1.1 1.3  CALCIUM 9.9 10.3 9.8   Recent Labs    04/19/21 0000 05/25/21 0000 09/26/21 0000  AST 12* 11* 11*  ALT 6* 5* 6*  ALKPHOS 115 128* 100  ALBUMIN 3.5 3.7 4.0   Recent Labs    03/14/21 0000 05/25/21 0000 09/26/21 0000  WBC 2.3 2.6 3.4  HGB 13.1* 12.4* 12.9*  HCT 39* 37* 39*  PLT 113* 97* 88*   Lab Results  Component Value Date   TSH 0.84 05/19/2019   Lab Results  Component Value Date   HGBA1C 6 08/22/2019   Lab Results  Component Value Date   CHOL 129 08/09/2020   HDL 32 (A) 08/09/2020   LDLCALC 80 08/09/2020   TRIG 86 08/09/2020   Significant Diagnostic Results in last 30 days:  No results found.  Assessment/Plan  1. Acute cystitis without hematuria Does not appear systemically ill Continue cefpodoxime to complete 7 day course, await culture Hold methenamine while on cefpodoxime  2. Suprapubic catheter (HCC) Mild irritation noted.  - mupirocin ointment (BACTROBAN) 2 % bid for local irritation x 7 days   Labs/tests  ordered:  INR Monday 6/5 due to antibiotic therapy.

## 2021-12-06 ENCOUNTER — Encounter: Payer: Self-pay | Admitting: Adult Health

## 2021-12-06 MED ORDER — MUPIROCIN 2 % EX OINT: TOPICAL_OINTMENT | Freq: Two times a day (BID) | CUTANEOUS | Status: DC

## 2021-12-08 ENCOUNTER — Inpatient Hospital Stay (HOSPITAL_COMMUNITY)
Admission: EM | Admit: 2021-12-08 | Discharge: 2021-12-10 | DRG: 280 | Disposition: A | Discharge status: Skilled Nursing Facility | Payer: Medicare Other | Source: Skilled Nursing Facility | Attending: Family Medicine | Admitting: Family Medicine

## 2021-12-08 ENCOUNTER — Emergency Department (HOSPITAL_COMMUNITY): Payer: Medicare Other

## 2021-12-08 DIAGNOSIS — G629 Polyneuropathy, unspecified: Secondary | ICD-10-CM | POA: Diagnosis present

## 2021-12-08 DIAGNOSIS — Z9359 Other cystostomy status: Secondary | ICD-10-CM

## 2021-12-08 DIAGNOSIS — I509 Heart failure, unspecified: Secondary | ICD-10-CM | POA: Diagnosis not present

## 2021-12-08 DIAGNOSIS — R7989 Other specified abnormal findings of blood chemistry: Secondary | ICD-10-CM | POA: Diagnosis not present

## 2021-12-08 DIAGNOSIS — F0390 Unspecified dementia without behavioral disturbance: Secondary | ICD-10-CM | POA: Diagnosis not present

## 2021-12-08 DIAGNOSIS — R0789 Other chest pain: Secondary | ICD-10-CM | POA: Diagnosis not present

## 2021-12-08 DIAGNOSIS — E669 Obesity, unspecified: Secondary | ICD-10-CM | POA: Diagnosis present

## 2021-12-08 DIAGNOSIS — I5033 Acute on chronic diastolic (congestive) heart failure: Secondary | ICD-10-CM | POA: Diagnosis present

## 2021-12-08 DIAGNOSIS — I4821 Permanent atrial fibrillation: Secondary | ICD-10-CM | POA: Diagnosis not present

## 2021-12-08 DIAGNOSIS — R079 Chest pain, unspecified: Secondary | ICD-10-CM | POA: Diagnosis not present

## 2021-12-08 DIAGNOSIS — Z880 Allergy status to penicillin: Secondary | ICD-10-CM

## 2021-12-08 DIAGNOSIS — I2721 Secondary pulmonary arterial hypertension: Secondary | ICD-10-CM | POA: Diagnosis present

## 2021-12-08 DIAGNOSIS — Z79899 Other long term (current) drug therapy: Secondary | ICD-10-CM | POA: Diagnosis not present

## 2021-12-08 DIAGNOSIS — Z823 Family history of stroke: Secondary | ICD-10-CM

## 2021-12-08 DIAGNOSIS — R778 Other specified abnormalities of plasma proteins: Secondary | ICD-10-CM | POA: Diagnosis not present

## 2021-12-08 DIAGNOSIS — E785 Hyperlipidemia, unspecified: Secondary | ICD-10-CM | POA: Diagnosis present

## 2021-12-08 DIAGNOSIS — I517 Cardiomegaly: Secondary | ICD-10-CM | POA: Diagnosis not present

## 2021-12-08 DIAGNOSIS — Z20822 Contact with and (suspected) exposure to covid-19: Secondary | ICD-10-CM | POA: Diagnosis present

## 2021-12-08 DIAGNOSIS — Z7901 Long term (current) use of anticoagulants: Secondary | ICD-10-CM

## 2021-12-08 DIAGNOSIS — D696 Thrombocytopenia, unspecified: Secondary | ICD-10-CM | POA: Diagnosis present

## 2021-12-08 DIAGNOSIS — R338 Other retention of urine: Secondary | ICD-10-CM | POA: Diagnosis not present

## 2021-12-08 DIAGNOSIS — I130 Hypertensive heart and chronic kidney disease with heart failure and stage 1 through stage 4 chronic kidney disease, or unspecified chronic kidney disease: Secondary | ICD-10-CM | POA: Diagnosis present

## 2021-12-08 DIAGNOSIS — I252 Old myocardial infarction: Secondary | ICD-10-CM | POA: Diagnosis not present

## 2021-12-08 DIAGNOSIS — N3000 Acute cystitis without hematuria: Secondary | ICD-10-CM

## 2021-12-08 DIAGNOSIS — Z85528 Personal history of other malignant neoplasm of kidney: Secondary | ICD-10-CM | POA: Diagnosis not present

## 2021-12-08 DIAGNOSIS — I10 Essential (primary) hypertension: Secondary | ICD-10-CM

## 2021-12-08 DIAGNOSIS — R0603 Acute respiratory distress: Secondary | ICD-10-CM | POA: Diagnosis not present

## 2021-12-08 DIAGNOSIS — N182 Chronic kidney disease, stage 2 (mild): Secondary | ICD-10-CM | POA: Diagnosis present

## 2021-12-08 DIAGNOSIS — J9601 Acute respiratory failure with hypoxia: Secondary | ICD-10-CM | POA: Diagnosis present

## 2021-12-08 DIAGNOSIS — I4811 Longstanding persistent atrial fibrillation: Secondary | ICD-10-CM

## 2021-12-08 DIAGNOSIS — I21A1 Myocardial infarction type 2: Principal | ICD-10-CM | POA: Diagnosis present

## 2021-12-08 DIAGNOSIS — Z87891 Personal history of nicotine dependence: Secondary | ICD-10-CM

## 2021-12-08 DIAGNOSIS — I739 Peripheral vascular disease, unspecified: Secondary | ICD-10-CM | POA: Diagnosis present

## 2021-12-08 DIAGNOSIS — N401 Enlarged prostate with lower urinary tract symptoms: Secondary | ICD-10-CM | POA: Diagnosis present

## 2021-12-08 DIAGNOSIS — Z8673 Personal history of transient ischemic attack (TIA), and cerebral infarction without residual deficits: Secondary | ICD-10-CM

## 2021-12-08 DIAGNOSIS — I214 Non-ST elevation (NSTEMI) myocardial infarction: Secondary | ICD-10-CM

## 2021-12-08 DIAGNOSIS — R0902 Hypoxemia: Secondary | ICD-10-CM | POA: Diagnosis not present

## 2021-12-08 DIAGNOSIS — I110 Hypertensive heart disease with heart failure: Secondary | ICD-10-CM | POA: Diagnosis not present

## 2021-12-08 DIAGNOSIS — R000 Tachycardia, unspecified: Secondary | ICD-10-CM | POA: Diagnosis not present

## 2021-12-08 DIAGNOSIS — Z87440 Personal history of urinary (tract) infections: Secondary | ICD-10-CM

## 2021-12-08 DIAGNOSIS — R0989 Other specified symptoms and signs involving the circulatory and respiratory systems: Secondary | ICD-10-CM | POA: Diagnosis not present

## 2021-12-08 DIAGNOSIS — N390 Urinary tract infection, site not specified: Secondary | ICD-10-CM

## 2021-12-08 DIAGNOSIS — Z85820 Personal history of malignant melanoma of skin: Secondary | ICD-10-CM

## 2021-12-08 DIAGNOSIS — M2550 Pain in unspecified joint: Secondary | ICD-10-CM | POA: Diagnosis not present

## 2021-12-08 DIAGNOSIS — Z7401 Bed confinement status: Secondary | ICD-10-CM | POA: Diagnosis not present

## 2021-12-08 DIAGNOSIS — Z6829 Body mass index (BMI) 29.0-29.9, adult: Secondary | ICD-10-CM

## 2021-12-08 DIAGNOSIS — I5031 Acute diastolic (congestive) heart failure: Secondary | ICD-10-CM | POA: Diagnosis not present

## 2021-12-08 DIAGNOSIS — Z66 Do not resuscitate: Secondary | ICD-10-CM | POA: Diagnosis present

## 2021-12-08 DIAGNOSIS — I4891 Unspecified atrial fibrillation: Secondary | ICD-10-CM | POA: Diagnosis present

## 2021-12-08 LAB — CBC WITH DIFFERENTIAL/PLATELET
Abs Immature Granulocytes: 0.05 10*3/uL (ref 0.00–0.07)
Basophils Absolute: 0 10*3/uL (ref 0.0–0.1)
Basophils Relative: 0 %
Eosinophils Absolute: 0 10*3/uL (ref 0.0–0.5)
Eosinophils Relative: 0 %
HCT: 41.1 % (ref 39.0–52.0)
Hemoglobin: 13.1 g/dL (ref 13.0–17.0)
Immature Granulocytes: 1 %
Lymphocytes Relative: 4 %
Lymphs Abs: 0.2 10*3/uL — ABNORMAL LOW (ref 0.7–4.0)
MCH: 28.2 pg (ref 26.0–34.0)
MCHC: 31.9 g/dL (ref 30.0–36.0)
MCV: 88.4 fL (ref 80.0–100.0)
Monocytes Absolute: 0.6 10*3/uL (ref 0.1–1.0)
Monocytes Relative: 14 %
Neutro Abs: 3.8 10*3/uL (ref 1.7–7.7)
Neutrophils Relative %: 81 %
Platelets: 85 10*3/uL — ABNORMAL LOW (ref 150–400)
RBC: 4.65 MIL/uL (ref 4.22–5.81)
RDW: 17.2 % — ABNORMAL HIGH (ref 11.5–15.5)
WBC: 4.7 10*3/uL (ref 4.0–10.5)
nRBC: 0 % (ref 0.0–0.2)

## 2021-12-08 LAB — BRAIN NATRIURETIC PEPTIDE: B Natriuretic Peptide: 304.1 pg/mL — ABNORMAL HIGH (ref 0.0–100.0)

## 2021-12-08 LAB — BASIC METABOLIC PANEL
Anion gap: 8 (ref 5–15)
BUN: 18 mg/dL (ref 8–23)
CO2: 23 mmol/L (ref 22–32)
Calcium: 9.7 mg/dL (ref 8.9–10.3)
Chloride: 105 mmol/L (ref 98–111)
Creatinine, Ser: 1.23 mg/dL (ref 0.61–1.24)
GFR, Estimated: 57 mL/min — ABNORMAL LOW (ref >60)
Glucose, Bld: 135 mg/dL — ABNORMAL HIGH (ref 70–99)
Potassium: 4.2 mmol/L (ref 3.5–5.1)
Sodium: 136 mmol/L (ref 135–145)

## 2021-12-08 LAB — SARS CORONAVIRUS 2 BY RT PCR: SARS Coronavirus 2 by RT PCR: NEGATIVE

## 2021-12-08 LAB — HEPATIC FUNCTION PANEL
ALT: 8 U/L (ref 0–44)
AST: 14 U/L — ABNORMAL LOW (ref 15–41)
Albumin: 3.6 g/dL (ref 3.5–5.0)
Alkaline Phosphatase: 101 U/L (ref 38–126)
Bilirubin, Direct: 0.4 mg/dL — ABNORMAL HIGH (ref 0.0–0.2)
Indirect Bilirubin: 1.2 mg/dL — ABNORMAL HIGH (ref 0.3–0.9)
Total Bilirubin: 1.6 mg/dL — ABNORMAL HIGH (ref 0.3–1.2)
Total Protein: 6.4 g/dL — ABNORMAL LOW (ref 6.5–8.1)

## 2021-12-08 LAB — PROTIME-INR
INR: 2.7 — ABNORMAL HIGH (ref 0.8–1.2)
Prothrombin Time: 28.2 seconds — ABNORMAL HIGH (ref 11.4–15.2)

## 2021-12-08 LAB — TROPONIN I (HIGH SENSITIVITY)
Troponin I (High Sensitivity): 86 ng/L — ABNORMAL HIGH (ref <18)
Troponin I (High Sensitivity): 150 ng/L (ref <18)

## 2021-12-08 MED ORDER — ACETAMINOPHEN 500 MG PO TABS
500.0000 mg | ORAL_TABLET | Freq: Two times a day (BID) | ORAL | Status: DC
  Administered 2021-12-09 – 2021-12-10 (×5): 500 mg via ORAL
  Filled 2021-12-08 (×5): qty 1

## 2021-12-08 MED ORDER — CEFDINIR 300 MG PO CAPS
300.0000 mg | ORAL_CAPSULE | Freq: Two times a day (BID) | ORAL | Status: DC
  Administered 2021-12-09: 300 mg via ORAL
  Filled 2021-12-08: qty 1

## 2021-12-08 MED ORDER — POTASSIUM CHLORIDE CRYS ER 10 MEQ PO TBCR: 10.0000 meq | EXTENDED_RELEASE_TABLET | ORAL | Status: DC

## 2021-12-08 MED ORDER — ATORVASTATIN CALCIUM 10 MG PO TABS
20.0000 mg | ORAL_TABLET | Freq: Every evening | ORAL | Status: DC
  Administered 2021-12-09 – 2021-12-10 (×3): 20 mg via ORAL
  Filled 2021-12-08 (×3): qty 2

## 2021-12-08 MED ORDER — FUROSEMIDE 10 MG/ML IJ SOLN
40.0000 mg | Freq: Once | INTRAMUSCULAR | Status: AC
Start: 2021-12-08 — End: 2021-12-08
  Administered 2021-12-08: 40 mg via INTRAVENOUS
  Filled 2021-12-08: qty 4

## 2021-12-08 MED ORDER — WARFARIN SODIUM 5 MG PO TABS
5.0000 mg | ORAL_TABLET | Freq: Once | ORAL | Status: AC
Start: 2021-12-08 — End: 2021-12-08
  Administered 2021-12-08: 5 mg via ORAL
  Filled 2021-12-08: qty 1

## 2021-12-08 MED ORDER — DULOXETINE HCL 20 MG PO CPEP
20.0000 mg | ORAL_CAPSULE | Freq: Every day | ORAL | Status: DC
  Administered 2021-12-09 – 2021-12-10 (×2): 20 mg via ORAL
  Filled 2021-12-08 (×3): qty 1

## 2021-12-08 MED ORDER — VITAMIN D 25 MCG (1000 UNIT) PO TABS
2000.0000 [IU] | ORAL_TABLET | Freq: Every day | ORAL | Status: DC
  Administered 2021-12-09 – 2021-12-10 (×2): 2000 [IU] via ORAL
  Filled 2021-12-08 (×2): qty 2

## 2021-12-08 MED ORDER — POLYETHYLENE GLYCOL 3350 17 G PO PACK
17.0000 g | PACK | Freq: Every day | ORAL | Status: DC
  Administered 2021-12-09 – 2021-12-10 (×2): 17 g via ORAL
  Filled 2021-12-08 (×2): qty 1

## 2021-12-08 MED ORDER — WARFARIN - PHARMACIST DOSING INPATIENT: Freq: Every day | Status: DC

## 2021-12-08 MED ORDER — FUROSEMIDE 10 MG/ML IJ SOLN
40.0000 mg | Freq: Every day | INTRAMUSCULAR | Status: DC
  Administered 2021-12-09: 40 mg via INTRAVENOUS
  Filled 2021-12-08: qty 4

## 2021-12-08 NOTE — ED Provider Notes (Signed)
[facility] EMERGENCY DEPARTMENT Provider Note   CSN: [id number] Arrival date & time: 12/08/21  1854     History {Add pertinent medical, surgical, social history, OB history to HPI:1} No chief complaint on file.   Peter Singh is a 88 y.o. male with a history of dementia, suprapubic catheter, recurrent UTIs, presenting by EMS from assisted living facility with concern for shortness of breath and chest pressure.  They report-patient initially called out for chest pain. He received full dose aspirin by the independent living facility, and was given 1 nitroglycerin tablet by EMS for hypertension.  They noted that he seemed to have rales and lower extremity edema and placed on nonrebreather, O2 sat originally 82% on their arrival.  The patient appears pleasantly demented on arrival, has no acute complaints.  He denies to me that he has chest pain or shortness of breath or difficulty breathing.  He is DNR with signed MOST form on arrival, confirmed in office note 12/05/21 by PCP.  He is on coumadin for A Fib.  HPI     Home Medications Prior to Admission medications   Medication Sig Start Date End Date Taking? Authorizing Provider  acetaminophen (TYLENOL) 500 MG tablet Take 500 mg by mouth 2 (two) times daily. Scheduled and every 4 hours as needed (ending 05/05/2019)Document area of discomfort and effectiveness of medication.    [provider]  atorvastatin (LIPITOR) 20 MG tablet Take 20 mg by mouth every evening.    [provider]  cefpodoxime (VANTIN) 100 MG tablet Take 100 mg by mouth 2 (two) times daily. For UTI    [provider]  cholecalciferol (VITAMIN D3) 25 MCG (1000 UT) tablet Take 2,000 Units by mouth daily.     [provider]  DULoxetine (CYMBALTA) 20 MG capsule Take 20 mg by mouth daily.    [provider]  furosemide (LASIX) 20 MG tablet Take 20 mg by mouth. Once A Day on Tue, Thu, Sat    [provider]  methenamine (MANDELAMINE) 1 g tablet Take 1,000 mg by mouth 2 (two) times daily.    [provider]  polyethylene glycol (MIRALAX / GLYCOLAX) 17 g packet Take 17 g by mouth daily.    [provider]  potassium chloride (MICRO-K) 10 MEQ CR capsule Take 10 mEq by mouth. Once A Day on Tue, Thu, Sat    [provider]  triamcinolone lotion (KENALOG) 0.1 % Apply 1 application topically 2 (two) times daily as needed.     [provider]  warfarin (COUMADIN) 4 MG tablet Take 4 mg by mouth. Once A Day on Mon, Wed, Fri    [provider]  warfarin (COUMADIN) 5 MG tablet Take 5 mg by mouth. Once A Day on Sun, Tue, Thu, Sat    [provider]      Allergies    Penicillins    Review of Systems   Review of Systems  Physical Exam Updated Vital Signs There were no vitals taken for this visit. Physical Exam Constitutional:      General: He is not in acute distress. HENT:     Head: Normocephalic and atraumatic.  Eyes:     Conjunctiva/sclera: Conjunctivae normal.     Pupils: Pupils are equal, round, and reactive to light.  Cardiovascular:     Rate and Rhythm: Normal rate. Rhythm irregular.  Pulmonary:     Effort: Pulmonary effort is normal. No respiratory distress.  Comments: Rhonchi bilateral lung bases Abdominal:     General: There is no distension.     Tenderness: There is no abdominal tenderness.  Skin:    General: Skin is warm and dry.  Neurological:     General: No focal deficit present.     Mental Status: He is alert and oriented to person, place, and time. Mental status is at baseline.    ED Results / Procedures / Treatments   Labs (all labs ordered are listed, but only abnormal results are displayed) Labs Reviewed - No data to display  EKG None  Radiology No results found.  Procedures Procedures  {Document cardiac monitor, telemetry assessment procedure when appropriate:1}  Medications Ordered in  ED Medications - No data to display  ED Course/ Medical Decision Making/ A&P                           Medical Decision Making Amount and/or Complexity of Data Reviewed Labs: ordered. Radiology: ordered. ECG/medicine tests: ordered.   This patient presents to the ED with concern for chest pain, hypoxia. This involves an extensive number of treatment options, and is a complaint that carries with it a high risk of complications and morbidity.  The differential diagnosis includes pneumonia versus ACS versus pneumothorax versus anemia versus other  Lower suspicion for PE given that he is on Coumadin, we will check INR level  Co-morbidities that complicate the patient evaluation: History of dementia, high blood pressure,  Additional history obtained from EMS on arrival  External records from outside source obtained and reviewed including outpatient PCP notes  I ordered and personally interpreted labs.  The pertinent results include:  ***  I ordered imaging studies including x-ray of the chest I independently visualized and interpreted imaging which showed *** I agree with the radiologist interpretation  The patient was maintained on a cardiac monitor.  I personally viewed and interpreted the cardiac monitored which showed an underlying rhythm of: ***  Per my interpretation the patient's ECG shows ***  I ordered medication including ***  for *** I have reviewed the patients home medicines and have made adjustments as needed  Test Considered: ***  I requested consultation with the ***,  and discussed lab and imaging findings as well as pertinent plan - they recommend: ***  After the interventions noted above, I reevaluated the patient and found that they have: {resolved/improved/worsened:23923::"improved"}  Social Determinants of Health:***  Dispostion:  After consideration of the diagnostic results and the patients response to treatment, I feel that the patent would benefit  from ***.   {Document critical care time when appropriate:1} {Document review of labs and clinical decision tools ie heart score, Chads2Vasc2 etc:1}  {Document your independent review of radiology images, and any outside records:1} {Document your discussion with family members, caretakers, and with consultants:1} {Document social determinants of health affecting pt's care:1} {Document your decision making why or why not admission, treatments were needed:1} Final Clinical Impression(s) / ED Diagnoses Final diagnoses:  None    Rx / DC Orders ED Discharge Orders     None

## 2021-12-08 NOTE — Assessment & Plan Note (Signed)
Currently on IV Lasix

## 2021-12-08 NOTE — Assessment & Plan Note (Signed)
Likely demand ischemia.  will continue to follow trend overnight.

## 2021-12-08 NOTE — Assessment & Plan Note (Addendum)
Usually on methenamine at baseline for prophylaxis but found to have grossly positive UA at facility on 6/1 and started on cefpodoxime.  -We will continue Omnicef here for 2 more days since cefpodoxime non-formulary. Has PCN allergy but has tolerated cephalosporin in the past.

## 2021-12-08 NOTE — Assessment & Plan Note (Addendum)
Possibly due to mild acute on chronic diastolic heart failure exacerbation. -Has no leukocytosis or infiltrate seen on chest x-ray.  However there is cardiomegaly with central vascular congestion with edema.  BNP is unequivocal around 300 but he an obese gentleman. -He does also have elevated troponin which could be demand ischemia but if this continues to trend significantly upward then would be concerning for NSTEMI. -Continue daily IV '40mg'$  Lasix. Has oral '20mg'$  Lasix Tues, Thurs, Sat on his med list.  - Monitor strict intake and output, daily weights -Obtain echocardiogram. -Last echocardiogram in 2016 with EF of 55 to 60% with no significant valvular abnormality.

## 2021-12-08 NOTE — ED Triage Notes (Signed)
Pt BIB EMS for resp distr. Initally complained of CP. Given 1 nitro, 324 ASA. HTN @ 202/94.  Hx of Dementia.

## 2021-12-08 NOTE — Progress Notes (Signed)
ANTICOAGULATION CONSULT NOTE - Initial Consult  Pharmacy Consult for Warfarin Indication: atrial fibrillation  Allergies  Allergen Reactions   Penicillins Rash    Has patient had a PCN reaction causing immediate rash, facial/tongue/throat swelling, SOB or lightheadedness with hypotension: unknown Has patient had a PCN reaction causing severe rash involving mucus membranes or skin necrosis: unknown Has patient had a PCN reaction that required hospitalization : unknown Has patient had a PCN reaction occurring within the last 10 years: no If all of the above answers are "NO", then may proceed with Cephalosporin use.     Patient Measurements: Height: '6\' 7"'$  (200.7 cm) Weight: 120.2 kg (265 lb) IBW/kg (Calculated) : 93.7  Vital Signs: Temp: 97.6 F (36.4 C) (06/04 1915) Temp Source: Oral (06/04 1915) BP: 145/66 (06/04 2000) Pulse Rate: 107 (06/04 2000)  Labs: Recent Labs    12/08/21 1904  HGB 13.1  HCT 41.1  PLT 85*  LABPROT 28.2*  INR 2.7*  CREATININE 1.23  TROPONINIHS 86*    Estimated Creatinine Clearance: 62.4 mL/min (by C-G formula based on SCr of 1.23 mg/dL).   Medical History: Past Medical History:  Diagnosis Date   Adult failure to thrive    Per incoming records from [facility]   Allergic rhinitis    Per incoming records from [facility]   Atrial fibrillation [facility])    BPH (benign prostatic hyperplasia)    Per incoming records from [place] of kidney [facility])    Right, Per incoming records from [place]    Per incoming records from [facility]   Cerebrovascular accident, late effects    Per incoming records from [place] kidney disease, stage III (moderate) ([place])    Per incoming records from [place] [facility])    Per incoming records from [facility]   Cognitive impairment     Mild, Per incoming records from [facility]   Constipation    Per incoming records from [facility]   Dementia [facility])    Diarrhea    Better off Aricept, Per incoming records from [facility]   Diastolic dysfunction    on 2D Echo 07/2009 and 2016, Per incoming records from [facility]   Diverticulosis    Mild, Per incoming records from [facility]   Diverticulosis of colon (without mention of hemorrhage)    ED (erectile dysfunction)    Per incoming records from [facility]   History of Coumadin therapy    Per incoming records from [facility]   History of echocardiogram 09/2014   Per incoming records from [facility]   Hyperlipemia    Hypertension    Kidney mass    Per incoming records from [place] leg edema    Per incoming records from [place] [facility])    Neuropathy    Per incoming records from [facility]   OA (osteoarthritis)    Per incoming records from [facility]   Peripheral neuropathy    Per incoming records from [place] history of colonic [place] & 2004   adenomatous polyps   Pulmonary arterial hypertension ([place])    Per incoming records from [place]    Per incoming records from [facility]   Thrombocytopenia [facility]) 2017  Per incoming records from [facility]   UTI (urinary tract infection)    Sepsis, Per incoming records from [place] hypertrophy    [place] as ambulation aid    Per incoming records from [facility]    Medications:  (Not in a hospital admission)  Scheduled:   furosemide  40 mg Intravenous Once   mupirocin ointment   Nasal BID   Infusions:   Assessment: 52 yom with a history of dementia,  chronic atrial fibrillation on Coumadin, chronic diastolic heart failure, hypertension, PVD and hyperlipidemia . Patient presenting with chest pain. Warfarin per pharmacy consult placed for AF.  Warfarin home dose per MAR: '4mg'$  MWF: '5mg'$  SuTuThSa -- Last taken 6/3 per MAR.  Hgb 13.1; Plt 85 PT/INR 28.2/2.7  Goal of Therapy:  INR 2-3 Monitor platelets by anticoagulation protocol: Yes   Plan:  Give '5mg'$  dose tonight Resume home dose 6/5 should AM INR prove ok Monitor for s/s of hemorrhage and daily INR  Lorelei Pont, PharmD, BCPS 12/08/2021 9:56 PM ED Clinical Pharmacist -  [phone]

## 2021-12-08 NOTE — Assessment & Plan Note (Signed)
Currently alert and oriented to self, family at bedside, place but not time.

## 2021-12-08 NOTE — Assessment & Plan Note (Signed)
Therapeutic on Coumadin.  Coumadin dosing per pharmacy.

## 2021-12-08 NOTE — Assessment & Plan Note (Signed)
Chronic thrombocytopenia. Stable at 89 on admit.

## 2021-12-08 NOTE — H&P (Signed)
History and Physical    Patient: Peter Singh VWU:[id number] DOB: 06-12-1932 DOA: 12/08/2021 DOS: the patient was seen and examined on 12/09/2021 PCP: [name], MD  [facility]: [facility]  Chief Complaint: Chest pain  HPI: Peter Singh is a 88 y.o. male with medical history significant of dementia, chronic atrial fibrillation on Coumadin, chronic diastolic heart failure, chronic urinary retention s/p suprapubic catheter, hypertension, PVD and hyperlipidemia presents from facility with concerns of chest pain and labored respiration.  Pt unable to provide history due to dementia. Wife provides limited history as she was just notified by nursing facility that he was complaining of chest pain and had hypoxia.  No history of previous MI.  Has not been been hospitalized for CHF that the wife knows of.  He has chronic lower extremity edema that is no worse than baseline.  Reports few cases of COVID in the facility about 2 weeks ago.  He was initially placed on nonrebreather 15 L by EMS and has been able to wean down to 2 L.  He will become hypoxic to 90% on room air.  He is afebrile, mildly tachycardic and normotensive. BNP of 304.  Troponin of 86.  EKG in atrial fibrillation.  No leukocytosis or anemia.  Chronic thrombocytopenia stable 85.  BMP largely unremarkable.  Chest x-ray shows cardiomegaly, central vascular congestion with edema. COVID PCR negative Review of Systems: unable to review all systems due to the inability of the patient to answer questions. Past Medical History:  Diagnosis Date   Adult failure to thrive    Per incoming records from [facility]   Allergic rhinitis    Per incoming records from [facility]   Atrial fibrillation [facility])    BPH (benign prostatic hyperplasia)    Per incoming records from [place] of kidney [facility])    Right, Per incoming records from [place]    Per incoming records from [facility]   Cerebrovascular accident, late effects    Per incoming records from [place] kidney disease, stage III (moderate) ([place])    Per incoming records from [facility])    Per incoming records from [facility]   Cognitive impairment    Mild, Per [facility]   Constipation    Per [facility]   Dementia [facility])    Diarrhea    Better off Aricept, Per incoming records from [facility]   Diastolic dysfunction    on 2D Echo 07/2009 and 2016, Per incoming records from [facility]   Diverticulosis    Mild, Per incoming records from [facility]   Diverticulosis of colon (without mention of hemorrhage)    ED (erectile dysfunction)    Per incoming records from [facility]   History of Coumadin therapy    Per incoming records from [facility]   History of echocardiogram 09/2014   Per incoming records from [facility]   Hyperlipemia    Hypertension    Kidney mass    Per incoming records from [place] leg edema    Per incoming records from [facility])    Neuropathy    Per incoming records from [facility]   OA (osteoarthritis)    Per  incoming records from [facility]   Peripheral neuropathy    Per incoming records from [facility] history of colonic [place] & 2004   adenomatous polyps   Pulmonary arterial hypertension ([place])    Per incoming records from [place]    Per incoming records from [facility]   Thrombocytopenia [facility]) 2017   Per incoming records from [facility]   UTI  (urinary tract infection)    Sepsis, Per incoming records from [place] as ambulation aid    Per incoming records from [facility]   Past Surgical History:  Procedure Laterality Date   APPENDECTOMY     Per incoming records from [place]     Per incoming records from [facility]  11/21/2019   KNEE SURGERY     right   PILONIDAL CYST DRAINAGE     POLYPECTOMY     Per incoming records from [place] tumor lumbar spine     SPINE SURGERY     tumor removed   THORACIC 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, Per incoming records from [place] History:  reports that he quit smoking about 46 years ago. His smoking use included cigarettes. He smoked an average of 3 packs per day. He has never used smokeless tobacco. He reports current alcohol use. He reports that he does not use drugs.  Allergies  Allergen Reactions   Penicillins Rash    Has patient had a PCN reaction causing immediate rash, facial/tongue/throat swelling, SOB or lightheadedness with hypotension: unknown Has patient had a PCN reaction causing severe rash involving mucus membranes or skin necrosis: unknown Has patient had a PCN reaction that required hospitalization : unknown Has patient had a PCN reaction occurring within the last 10 years: no If all of the above answers are "NO", then may proceed with Cephalosporin use.     Family History  Problem Relation Age of Onset   Stroke Father    CVA Father    Tuberculosis Father    Neuropathy Neg Hx     Prior to Admission medications   Medication Sig Start Date End Date Taking? Authorizing Provider  acetaminophen (TYLENOL) 500 MG tablet Take 500 mg by mouth 2 (two) times daily. Scheduled and every 4 hours as  needed (ending 05/05/2019)Document area of discomfort and effectiveness of medication.    [provider]  atorvastatin (LIPITOR) 20 MG tablet Take 20 mg by mouth every evening.    [provider]  cefpodoxime (VANTIN) 100 MG tablet Take 100 mg by mouth 2 (two) times daily. For UTI    [provider]  cholecalciferol (VITAMIN D3) 25 MCG (1000 UT) tablet Take 2,000 Units by mouth daily.     [provider]  DULoxetine (CYMBALTA) 20 MG capsule Take 20 mg by mouth daily.    [provider]  furosemide (LASIX) 20 MG tablet Take 20 mg by mouth. Once A Day on Tue, Thu, Sat    [provider]  methenamine (MANDELAMINE) 1 g tablet Take 1,000 mg by mouth 2 (two) times daily.    [provider]  polyethylene glycol (MIRALAX / GLYCOLAX) 17 g packet Take 17 g by mouth daily.  [provider]  potassium chloride (MICRO-K) 10 MEQ CR capsule Take 10 mEq by mouth. Once A Day on Tue, Thu, Sat    [provider]  triamcinolone lotion (KENALOG) 0.1 % Apply 1 application topically 2 (two) times daily as needed.     [provider]  warfarin (COUMADIN) 4 MG tablet Take 4 mg by mouth. Once A Day on Mon, Wed, Fri    [provider]  warfarin (COUMADIN) 5 MG tablet Take 5 mg by mouth. Once A Day on Sun, Tue, Thu, Sat    [provider]    Physical Exam: Vitals:   12/08/21 1945 12/08/21 2000 12/08/21 2200 12/08/21 2230  BP: 138/77 (!) 145/66 131/73 (!) 107/58  Pulse: 94 (!) 107 93 88  Resp: (!) 23 19 (!) 24 (!) 23  Temp:      TempSrc:      SpO2: 95% 94% 95% 96%  Weight:  120.2 kg    Height:  '6\' 7"'$  (2.007 m)     Constitutional: NAD, calm, comfortable Eyes: PERRL, lids and conjunctivae normal ENMT: Mucous membranes are moist. Posterior pharynx clear of any exudate or lesions.Normal dentition.  Neck: normal, supple, no masses, no thyromegaly Respiratory: clear to auscultation bilaterally, no wheezing, no  crackles. Normal respiratory effort. No accessory muscle use.  Cardiovascular: Irregularly irregular rate and rhythm, systolic flow murmur without any rubs or gallops.  +1 pitting edema of distal lower extremity and ankles. Abdomen: Soft, nondistended, nontender.  Suprapubic catheter in place with amber-colored urine in leg bag.  Bowel sounds positive.  Musculoskeletal: no clubbing / cyanosis. No joint deformity upper and lower extremities.  Normal muscle tone.  Skin: no rashes, lesions, ulcers. No induration Neurologic: CN 2-12 grossly intact.  Alert and oriented to self, wife at bedside, place, current president but not time.   Psychiatric: Normal mood. Data Reviewed:  See HPI  Assessment and Plan: * Acute respiratory failure with hypoxia ([place]) Multifactorial from NSTEMI and due to mild acute on chronic diastolic heart failure exacerbation. -Has no leukocytosis or infiltrate seen on chest x-ray.  However there is cardiomegaly with central vascular congestion with edema.  BNP is unequivocal around 300 but he an obese gentleman. -He also has elevated troponin that has now trended from 86 -->150 -->212 concerning for NSTEMI. -Continue daily IV '40mg'$  Lasix. Has oral '20mg'$  Lasix Tues, Thurs, Sat on his med list.  - Monitor strict intake and output, daily weights -Obtain echocardiogram. -Last echocardiogram in 2016 with EF of 55 to 60% with no significant valvular abnormality.  NSTEMI (non-ST elevated myocardial infarction) ([place]) Presented with troponin of 80 and initially thought could be due to demand ischemia but this has continually risen up to 200 concerning for NSTEMI which more convincingly explains his symptoms of chest pain and hypoxia since chest x-ray did not show overt pulmonary edema and his lower extremity edema is at baseline.  - However he is therapeutic with INR 2.7 on warfarin and has thrombocytopenia of 85. Discussed with cardiology fellow Dr. Renella Cunas regarding start of heparin  and he will see the pt in consultation tonight and he will place order if needed. Appreciate cardiology help.   UTI (urinary tract infection) Usually on methenamine at baseline for prophylaxis but found to have grossly positive UA at facility on 6/1 and started on cefpodoxime and later switch to [facility] on 6/3. Unable to see final culture sensitivity results- pharmacy to touch base with SNF to follow up.  -We will continue  Macrobid for total of 7 days  Dementia without behavioral disturbance ([place]) Currently alert and oriented to self, family at bedside, place but not time.  Urinary retention due to benign prostatic hyperplasia With chronic indwelling suprapubic catheter.  Catheter care daily per RN.  Thrombocytopenia (HCC) Chronic thrombocytopenia. Stable at 88 on admit.   Essential hypertension Currently on IV Lasix  Atrial fibrillation (HCC) Therapeutic on Coumadin.  Coumadin dosing per pharmacy.      Advance Care Planning:   Code Status: DNR   Consults: none  Family Communication: Discussed with wife at bedside  Severity of Illness: The appropriate patient status for this patient is OBSERVATION. Observation status is judged to be reasonable and necessary in order to provide the required intensity of service to ensure the patient's safety. The patient's presenting symptoms, physical exam findings, and initial radiographic and laboratory data in the context of their medical condition is felt to place them at decreased risk for further clinical deterioration. Furthermore, it is anticipated that the patient will be medically stable for discharge from the hospital within 2 midnights of admission.   Author: Orene Desanctis, DO 12/09/2021 1:53 AM  For on call review [email or web address].

## 2021-12-08 NOTE — Assessment & Plan Note (Signed)
With chronic indwelling suprapubic catheter.  Catheter care daily per RN.

## 2021-12-09 ENCOUNTER — Observation Stay (HOSPITAL_COMMUNITY): Payer: Medicare Other

## 2021-12-09 ENCOUNTER — Other Ambulatory Visit (HOSPITAL_COMMUNITY): Payer: Self-pay

## 2021-12-09 DIAGNOSIS — N182 Chronic kidney disease, stage 2 (mild): Secondary | ICD-10-CM | POA: Diagnosis present

## 2021-12-09 DIAGNOSIS — I130 Hypertensive heart and chronic kidney disease with heart failure and stage 1 through stage 4 chronic kidney disease, or unspecified chronic kidney disease: Secondary | ICD-10-CM | POA: Diagnosis present

## 2021-12-09 DIAGNOSIS — E785 Hyperlipidemia, unspecified: Secondary | ICD-10-CM | POA: Diagnosis present

## 2021-12-09 DIAGNOSIS — G629 Polyneuropathy, unspecified: Secondary | ICD-10-CM | POA: Diagnosis present

## 2021-12-09 DIAGNOSIS — I21A1 Myocardial infarction type 2: Secondary | ICD-10-CM | POA: Diagnosis present

## 2021-12-09 DIAGNOSIS — R338 Other retention of urine: Secondary | ICD-10-CM | POA: Diagnosis present

## 2021-12-09 DIAGNOSIS — I4811 Longstanding persistent atrial fibrillation: Secondary | ICD-10-CM | POA: Diagnosis not present

## 2021-12-09 DIAGNOSIS — E669 Obesity, unspecified: Secondary | ICD-10-CM | POA: Diagnosis present

## 2021-12-09 DIAGNOSIS — I5031 Acute diastolic (congestive) heart failure: Secondary | ICD-10-CM | POA: Diagnosis not present

## 2021-12-09 DIAGNOSIS — I214 Non-ST elevation (NSTEMI) myocardial infarction: Secondary | ICD-10-CM | POA: Diagnosis not present

## 2021-12-09 DIAGNOSIS — Z79899 Other long term (current) drug therapy: Secondary | ICD-10-CM | POA: Diagnosis not present

## 2021-12-09 DIAGNOSIS — N401 Enlarged prostate with lower urinary tract symptoms: Secondary | ICD-10-CM | POA: Diagnosis present

## 2021-12-09 DIAGNOSIS — Z20822 Contact with and (suspected) exposure to covid-19: Secondary | ICD-10-CM | POA: Diagnosis present

## 2021-12-09 DIAGNOSIS — N390 Urinary tract infection, site not specified: Secondary | ICD-10-CM | POA: Diagnosis present

## 2021-12-09 DIAGNOSIS — Z7401 Bed confinement status: Secondary | ICD-10-CM | POA: Diagnosis not present

## 2021-12-09 DIAGNOSIS — R7989 Other specified abnormal findings of blood chemistry: Secondary | ICD-10-CM | POA: Diagnosis not present

## 2021-12-09 DIAGNOSIS — I4821 Permanent atrial fibrillation: Secondary | ICD-10-CM | POA: Diagnosis present

## 2021-12-09 DIAGNOSIS — Z66 Do not resuscitate: Secondary | ICD-10-CM | POA: Diagnosis present

## 2021-12-09 DIAGNOSIS — I10 Essential (primary) hypertension: Secondary | ICD-10-CM | POA: Diagnosis not present

## 2021-12-09 DIAGNOSIS — I2721 Secondary pulmonary arterial hypertension: Secondary | ICD-10-CM | POA: Diagnosis present

## 2021-12-09 DIAGNOSIS — J9601 Acute respiratory failure with hypoxia: Secondary | ICD-10-CM | POA: Diagnosis present

## 2021-12-09 DIAGNOSIS — D696 Thrombocytopenia, unspecified: Secondary | ICD-10-CM | POA: Diagnosis present

## 2021-12-09 DIAGNOSIS — I739 Peripheral vascular disease, unspecified: Secondary | ICD-10-CM | POA: Diagnosis present

## 2021-12-09 DIAGNOSIS — Z880 Allergy status to penicillin: Secondary | ICD-10-CM | POA: Diagnosis not present

## 2021-12-09 DIAGNOSIS — N3000 Acute cystitis without hematuria: Secondary | ICD-10-CM | POA: Diagnosis not present

## 2021-12-09 DIAGNOSIS — Z9359 Other cystostomy status: Secondary | ICD-10-CM | POA: Diagnosis not present

## 2021-12-09 DIAGNOSIS — Z7901 Long term (current) use of anticoagulants: Secondary | ICD-10-CM | POA: Diagnosis not present

## 2021-12-09 DIAGNOSIS — Z85528 Personal history of other malignant neoplasm of kidney: Secondary | ICD-10-CM | POA: Diagnosis not present

## 2021-12-09 DIAGNOSIS — F0390 Unspecified dementia without behavioral disturbance: Secondary | ICD-10-CM | POA: Diagnosis present

## 2021-12-09 DIAGNOSIS — I252 Old myocardial infarction: Secondary | ICD-10-CM | POA: Diagnosis not present

## 2021-12-09 DIAGNOSIS — I5033 Acute on chronic diastolic (congestive) heart failure: Secondary | ICD-10-CM | POA: Diagnosis present

## 2021-12-09 DIAGNOSIS — M2550 Pain in unspecified joint: Secondary | ICD-10-CM | POA: Diagnosis not present

## 2021-12-09 LAB — BASIC METABOLIC PANEL
Anion gap: 8 (ref 5–15)
BUN: 18 mg/dL (ref 8–23)
CO2: 25 mmol/L (ref 22–32)
Calcium: 9.7 mg/dL (ref 8.9–10.3)
Chloride: 105 mmol/L (ref 98–111)
Creatinine, Ser: 1.47 mg/dL — ABNORMAL HIGH (ref 0.61–1.24)
GFR, Estimated: 46 mL/min — ABNORMAL LOW (ref >60)
Glucose, Bld: 109 mg/dL — ABNORMAL HIGH (ref 70–99)
Potassium: 4.1 mmol/L (ref 3.5–5.1)
Sodium: 138 mmol/L (ref 135–145)

## 2021-12-09 LAB — ECHOCARDIOGRAM COMPLETE
AR max vel: 1.71 cm2
AV Area VTI: 1.6 cm2
AV Area mean vel: 1.4 cm2
AV Mean grad: 13.5 mmHg
AV Peak grad: 22.6 mmHg
Ao pk vel: 2.38 m/s
Area-P 1/2: 4.11 cm2
Calc EF: 72.6 %
Height: 79 in
S' Lateral: 3.4 cm
Single Plane A2C EF: 72.9 %
Single Plane A4C EF: 71.7 %
Weight: 4240 oz

## 2021-12-09 LAB — TROPONIN I (HIGH SENSITIVITY)
Troponin I (High Sensitivity): 212 ng/L (ref <18)
Troponin I (High Sensitivity): 319 ng/L (ref <18)

## 2021-12-09 LAB — CBC
HCT: 38.4 % — ABNORMAL LOW (ref 39.0–52.0)
Hemoglobin: 12.3 g/dL — ABNORMAL LOW (ref 13.0–17.0)
MCH: 28.3 pg (ref 26.0–34.0)
MCHC: 32 g/dL (ref 30.0–36.0)
MCV: 88.3 fL (ref 80.0–100.0)
Platelets: 85 10*3/uL — ABNORMAL LOW (ref 150–400)
RBC: 4.35 MIL/uL (ref 4.22–5.81)
RDW: 17.2 % — ABNORMAL HIGH (ref 11.5–15.5)
WBC: 3.5 10*3/uL — ABNORMAL LOW (ref 4.0–10.5)
nRBC: 0 % (ref 0.0–0.2)

## 2021-12-09 LAB — PROTIME-INR
INR: 2.7 — ABNORMAL HIGH (ref 0.8–1.2)
Prothrombin Time: 28.3 seconds — ABNORMAL HIGH (ref 11.4–15.2)

## 2021-12-09 MED ORDER — PANTOPRAZOLE SODIUM 40 MG PO TBEC
40.0000 mg | DELAYED_RELEASE_TABLET | Freq: Every day | ORAL | Status: DC
  Administered 2021-12-09 – 2021-12-10 (×2): 40 mg via ORAL
  Filled 2021-12-09 (×2): qty 1

## 2021-12-09 MED ORDER — PERFLUTREN LIPID MICROSPHERE
1.0000 mL | INTRAVENOUS | Status: DC | PRN
  Administered 2021-12-09: 2 mL via INTRAVENOUS

## 2021-12-09 MED ORDER — NITROFURANTOIN MONOHYD MACRO 100 MG PO CAPS
100.0000 mg | ORAL_CAPSULE | Freq: Two times a day (BID) | ORAL | Status: DC
  Administered 2021-12-09 – 2021-12-10 (×5): 100 mg via ORAL
  Filled 2021-12-09 (×6): qty 1

## 2021-12-09 MED ORDER — ASPIRIN 81 MG PO CHEW
81.0000 mg | CHEWABLE_TABLET | Freq: Every day | ORAL | Status: DC
  Administered 2021-12-09 – 2021-12-10 (×2): 81 mg via ORAL
  Filled 2021-12-09 (×2): qty 1

## 2021-12-09 NOTE — Progress Notes (Addendum)
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  He was found to be saturating 90% on room air.  Consultants:  Cardiology  Goals of Care:  Code Status: DNR   DVT prophylaxis: IV heparin  Interim Hx: Vital signs stable.  Heart rate controlled.  Saturation now 100% on room air.  Resting comfortably in bed at the time my visit.  Is pleasant and interactive.  Denies chest pain shortness of breath fevers or chills.  States he feels much better.  History compromised by patient's dementia but augmented by wife at bedside.  Assessment & Plan:  Acute hypoxic respiratory failure Desaturating to 90% on room air with tachypnea and dyspnea at time of presentation - multifactorial from NSTEMI and due to mild acute on chronic diastolic heart failure exacerbation -improving with diuresis  NSTEMI  Presented with troponin of 80 which trended up to 200 -care per Cardiology with plan to manage conservatively at this time  Chronic atrial fibrillation on Coumadin Rate controlled  Increased creatinine Monitor creatinine trend closely -creatinine 1.23 at presentation and 1.47 today  UTI  found to have grossly positive UA at facility on 6/1 and started on cefpodoxime and later switch to Macrobid on 6/3 -continuing therapy here but no active symptoms of UTI presently  Dementia without behavioral disturbance Appears to be at his baseline at the time of my exam today  Urinary retention due to benign prostatic hyperplasia With chronic indwelling suprapubic catheter  Thrombocytopenia Chronic thrombocytopenia  Essential hypertension Blood pressure controlled at present  Family  Communication: Spoke with the patient's wife at bedside at length Disposition: [facility] after stabilization of medical issues -likely within 24-48 hours   Objective: Blood pressure 136/75, pulse 71, temperature 97.6 F (36.4 C), temperature source Oral, resp. rate (!) 26, height '6\' 7"'$  (2.007 m), weight 120.2 kg, SpO2 100 %.  Intake/Output Summary (Last 24 hours) at 12/09/2021 0958 Last data filed at 12/09/2021 0008 Gross per 24 hour  Intake --  Output 450 ml  Net -450 ml   Filed Weights   12/08/21 2000  Weight: 120.2 kg    Examination: General: No acute respiratory distress Lungs: Mild bibasilar crackles with no wheezing Cardiovascular: Regular rate and rhythm without gallop or rub normal S1 and S2 Abdomen: Nontender, nondistended, soft, bowel sounds positive, no rebound, no ascites, no appreciable mass Extremities: Trace bilateral lower extremity edema  CBC: Recent Labs  Lab 12/08/21 1904 12/09/21 0427  WBC 4.7 3.5*  NEUTROABS 3.8  --   HGB 13.1 12.3*  HCT 41.1 38.4*  MCV 88.4 88.3  PLT 85* 85*   Basic Metabolic Panel: Recent Labs  Lab 12/08/21 1904 12/09/21 0427  NA 136 138  K 4.2 4.1  CL 105 105  CO2 23 25  GLUCOSE 135* 109*  BUN 18 18  CREATININE 1.23 1.47*  CALCIUM 9.7 9.7   GFR: Estimated Creatinine Clearance: 52.2 mL/min (A) (by C-G formula based on SCr of 1.47 mg/dL (H)).  Liver Function Tests: Recent Labs  Lab 12/08/21 1904  AST 14*  ALT 8  ALKPHOS 101  BILITOT 1.6*  PROT 6.4*  ALBUMIN 3.6    HbA1C: Hemoglobin A1C  Date/Time Value Ref Range  Status  08/22/2019 12:00 AM 6  Final   Hgb A1c MFr Bld  Date/Time Value Ref Range Status  07/19/2014 11:37 AM 6.0 (H) 4.8 - 5.6 % Final    Comment:             Pre-diabetes: 5.7 - 6.4          Diabetes: >6.4          Glycemic control for adults with diabetes: <7.0     Scheduled Meds:  acetaminophen  500 mg Oral BID   aspirin  81 mg Oral Daily   atorvastatin  20 mg  Oral QPM   cholecalciferol  2,000 Units Oral Daily   DULoxetine  20 mg Oral Daily   furosemide  40 mg Intravenous Daily   mupirocin ointment   Nasal BID   nitrofurantoin (macrocrystal-monohydrate)  100 mg Oral Q12H   pantoprazole  40 mg Oral Daily   polyethylene glycol  17 g Oral Daily   [START ON 12/10/2021] potassium chloride  10 mEq Oral Q T,Th,Sat-1800     LOS: 0 days   Cherene Altes, MD [facility] Office  [phone] Pager - Text Page per Shea Evans  If 7PM-7AM, please contact night-coverage per Amion 12/09/2021, 9:58 AM

## 2021-12-09 NOTE — Progress Notes (Signed)
ANTICOAGULATION CONSULT NOTE - Initial Consult  Pharmacy Consult for Heparin when INR <2 (warfarin on hold) Indication: Afib, rule out ACS  Allergies  Allergen Reactions   Penicillins Rash    Has patient had a PCN reaction causing immediate rash, facial/tongue/throat swelling, SOB or lightheadedness with hypotension: unknown Has patient had a PCN reaction causing severe rash involving mucus membranes or skin necrosis: unknown Has patient had a PCN reaction that required hospitalization : unknown Has patient had a PCN reaction occurring within the last 10 years: no If all of the above answers are "NO", then may proceed with Cephalosporin use.     Patient Measurements: Height: '6\' 7"'$  (200.7 cm) Weight: 120.2 kg (265 lb) IBW/kg (Calculated) : 93.7 Vital Signs: Temp: 97.6 F (36.4 C) (06/04 1915) Temp Source: Oral (06/04 1915) BP: 124/65 (06/05 0415) Pulse Rate: 64 (06/05 0415)  Labs: Recent Labs    12/08/21 1904 12/08/21 2203 12/09/21 0015  HGB 13.1  --   --   HCT 41.1  --   --   PLT 85*  --   --   LABPROT 28.2*  --   --   INR 2.7*  --   --   CREATININE 1.23  --   --   TROPONINIHS 86* 150* 212*    Estimated Creatinine Clearance: 62.4 mL/min (by C-G formula based on SCr of 1.23 mg/dL).   Medical History: Past Medical History:  Diagnosis Date   Adult failure to thrive    Per incoming records from [facility]   Allergic rhinitis    Per incoming records [facility]   Atrial fibrillation [facility])    BPH (benign prostatic hyperplasia)    Per incoming records from [place] of kidney [facility])    Right, Per incoming records from [place]    Per incoming records from [facility]   Cerebrovascular accident, late effects    Per incoming records from [place] kidney disease, stage III (moderate) ([place])    Per incoming records from [facility])    Per incoming records from [facility]   Cognitive impairment    Mild, Per incoming records from [facility]   Constipation    Per incoming records from [facility]   Dementia [facility])    Diarrhea    Better off Aricept, Per incoming records from [facility]   Diastolic dysfunction    on 2D Echo 07/2009 and 2016, Per [facility]   Diverticulosis    Mild, Per incoming records from [facility]   Diverticulosis of colon (without mention of hemorrhage)    ED (erectile dysfunction)    Per incoming records from [facility]   History of Coumadin therapy    Per incoming records from [facility]   History of echocardiogram 09/2014   Per incoming records from [facility]   Hyperlipemia    Hypertension    Kidney mass    Per incoming records from [place] leg edema    Per incoming records from [place] [facility])    Neuropathy    Per incoming records from [facility]   OA (osteoarthritis)    Per incoming records from [facility]   Peripheral neuropathy    Per incoming records from [facility]   Personal history of colonic polyps 1999 & 2004   adenomatous polyps   Pulmonary arterial hypertension ([place])    Per incoming records from [place]    Per incoming records from [facility]   Thrombocytopenia [facility]) 2017   Per incoming records from [facility]   UTI (urinary tract infection)    Sepsis, Per incoming records from [place] hypertrophy    Gilford Rile as ambulation aid    Per incoming records from [place]: 88 y/o M with mildly elevated troponin. Holding warfarin  and starting heparin when INR is <2. INR is currently 2.7.   Goal of Therapy:  Heparin level 0.3-0.7 units/ml Monitor platelets by anticoagulation protocol: Yes   Plan:  Daily PT/INR Start heparin when INR is <2  Narda Bonds, PharmD, [place] Pharmacist Phone: [phone]

## 2021-12-09 NOTE — ED Notes (Signed)
ED TO INPATIENT HANDOFF REPORT   Name/Age/Gender Peter Singh 88 y.o. male Room/Bed: 030C/030C  Code Status   Code Status: DNR  Home/SNF/Other Home Patient oriented to: self Is this baseline? Yes   Triage Complete: Triage complete  Chief Complaint Acute respiratory failure with hypoxia ([place]) [J96.01]  Triage Note Pt BIB EMS for resp distr. Initally complained of CP. Given 1 nitro, 324 ASA. HTN @ 202/94.  Hx of Dementia.   Allergies Allergies  Allergen Reactions   Penicillins Rash    Has patient had a PCN reaction causing immediate rash, facial/tongue/throat swelling, SOB or lightheadedness with hypotension: unknown Has patient had a PCN reaction causing severe rash involving mucus membranes or skin necrosis: unknown Has patient had a PCN reaction that required hospitalization : unknown Has patient had a PCN reaction occurring within the last 10 years: no If all of the above answers are "NO", then may proceed with Cephalosporin use.     Level of Care/Admitting Diagnosis ED Disposition     ED Disposition  Admit   Condition  --   Comment  Hospital Area: [place] [[id number]]  Level of Care: Telemetry Cardiac [[id number]]  May place patient in observation at [facility] or [place] if equivalent level of care is available:: No  Covid Evaluation: Asymptomatic - no recent exposure (last 10 days) testing not required  Diagnosis: Acute respiratory failure with hypoxia [facility]) [[id number]]  Admitting Physician: Orene Desanctis [[id number]]  Attending Physician: Orene Desanctis [[id number]]          B Medical/Surgery History Past Medical History:  Diagnosis Date   Adult failure to thrive    Per incoming records from [place]   Allergic rhinitis    Per incoming records from [facility]   Atrial fibrillation [facility])    BPH (benign prostatic hyperplasia)    Per incoming records from [place] of kidney [facility])     Right, Per incoming records from [place]    Per incoming records from [facility]   Cerebrovascular accident, late effects    Per incoming records from [place] kidney disease, stage III (moderate) ([place])    Per incoming records from [facility])    Per incoming records from [facility]   Cognitive impairment    Mild, Per incoming records from [facility]   Constipation    Per incoming records from [facility]   Dementia [facility])    Diarrhea    Better off Aricept, Per incoming records from [facility]   Diastolic dysfunction    on 2D Echo 07/2009 and 2016, Per incoming records from [facility]   Diverticulosis    Mild, Per incoming records from [facility]   Diverticulosis of colon (without mention of hemorrhage)    ED (erectile dysfunction)    Per incoming records from [facility]   History of Coumadin therapy    Per incoming records from [facility]   History of echocardiogram 09/2014   Per incoming records from [facility]   Hyperlipemia    Hypertension    Kidney mass    Per incoming records from [place] leg edema    Per incoming records from [facility])    Neuropathy    Per incoming records from  [place]   OA (osteoarthritis)    Per incoming records from [facility]   Peripheral neuropathy    Per incoming records from [place] history of colonic polyps 1999 & 2004   adenomatous polyps   Pulmonary arterial hypertension ([place])    Per incoming records from [place]    Per incoming records from [facility]   Thrombocytopenia [facility]) 2017   Per incoming  records from [facility]   UTI (urinary tract infection)    Sepsis, Per incoming records from [place] as ambulation aid    Per incoming records from [facility]   Past Surgical History:  Procedure Laterality Date   APPENDECTOMY     Per incoming records from [place]     Per incoming records from [place]  11/21/2019   KNEE SURGERY     right   PILONIDAL CYST DRAINAGE     POLYPECTOMY     Per incoming records from [place] tumor lumbar spine     SPINE SURGERY     tumor removed   THORACIC 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 11/21/19  1554  Groin  749            Intake/Output Last 24 hours  Intake/Output Summary (Last 24 hours) at 12/09/2021 1027 Last data filed at 12/09/2021 0008 [place] per 24 hour  Intake --  Output 450 ml  Net -450 ml    Labs/Imaging Results for orders placed or performed during the hospital encounter of 12/08/21 (from the past 48 hour(s))  Basic metabolic panel     Status: Abnormal   Collection Time: 12/08/21  7:04 PM  Result Value Ref Range   Sodium 136 135 - 145 mmol/L   Potassium 4.2 3.5 - 5.1 mmol/L   Chloride 105 98 - 111 mmol/L   CO2 23 22 - 32 mmol/L   Glucose, Bld 135 (H) 70 - 99 mg/dL    Comment: Glucose reference range applies only to samples taken after fasting for at least 8 hours.   BUN 18 8 - 23 mg/dL   Creatinine, Ser 1.23 0.61 - 1.24  mg/dL   Calcium 9.7 8.9 - 10.3 mg/dL   GFR, Estimated 57 (L) >60 mL/min    Comment: (NOTE) Calculated using the CKD-EPI Creatinine Equation (2021)    Anion gap 8 5 - 15    Comment: Performed at [facility] [address], [place], [place] [address]  CBC with Differential     Status: Abnormal   Collection Time: 12/08/21  7:04 PM  Result Value Ref Range   WBC 4.7 4.0 - 10.5 K/uL   RBC 4.65 4.22 - 5.81 MIL/uL   Hemoglobin 13.1 13.0 - 17.0 g/dL   HCT 41.1 39.0 - 52.0 %   MCV 88.4 80.0 - 100.0 fL   MCH 28.2 26.0 - 34.0  pg   MCHC 31.9 30.0 - 36.0 g/dL   RDW 17.2 (H) 11.5 - 15.5 %   Platelets 85 (L) 150 - 400 K/uL    Comment: SPECIMEN CHECKED FOR CLOTS Immature Platelet Fraction may be clinically indicated, consider ordering this additional test [id number] REPEATED TO VERIFY PLATELET COUNT CONFIRMED BY SMEAR    nRBC 0.0 0.0 - 0.2 %   Neutrophils Relative % 81 %   Neutro Abs 3.8 1.7 - 7.7 K/uL   Lymphocytes Relative 4 %   Lymphs Abs 0.2 (L) 0.7 - 4.0 K/uL   Monocytes Relative 14 %   Monocytes Absolute 0.6 0.1 - 1.0 K/uL   Eosinophils Relative 0 %   Eosinophils Absolute 0.0 0.0 - 0.5 K/uL   Basophils Relative 0 %   Basophils Absolute 0.0 0.0 - 0.1 K/uL   Immature Granulocytes 1 %   Abs Immature Granulocytes 0.05 0.00 - 0.07 K/uL    Comment: Performed at [facility], [address] [place], [place] [address]  Troponin I (High Sensitivity)     Status: Abnormal   Collection Time: 12/08/21  7:04 PM  Result Value Ref Range   Troponin I (High Sensitivity) 86 (H) <18 ng/L    Comment: (NOTE) Elevated high sensitivity troponin I (hsTnI) values and significant  changes across serial measurements may suggest ACS but many other  chronic and acute conditions are known to elevate hsTnI results.  Refer to the "Links" section for chest pain algorithms and additional  guidance. Performed at [facility], [place] [address] [place], [place] [address]   Brain natriuretic  peptide     Status: Abnormal   Collection Time: 12/08/21  7:04 PM  Result Value Ref Range   B Natriuretic Peptide 304.1 (H) 0.0 - 100.0 pg/mL    Comment: Performed at [facility] [address], [place], [place] [address]  SARS Coronavirus 2 by RT PCR (hospital order, performed in [facility] hospital lab) *cepheid single result test* Anterior Nasal Swab     Status: None   Collection Time: 12/08/21  7:04 PM   Specimen: Anterior Nasal Swab  Result Value Ref Range   SARS Coronavirus 2 by RT PCR NEGATIVE NEGATIVE    Comment: (NOTE) SARS-CoV-2 target nucleic acids are NOT DETECTED.  The SARS-CoV-2 RNA is generally detectable in upper and lower respiratory specimens during the acute phase of infection. The lowest concentration of SARS-CoV-2 viral copies this assay can detect is 250 copies / mL. A negative result does not preclude SARS-CoV-2 infection and should not be used as the sole basis for treatment or other patient management decisions.  A negative result may occur with improper specimen collection / handling, submission of specimen other than nasopharyngeal swab, presence of viral mutation(s) within the areas targeted by this assay, and inadequate number of viral copies (<250 copies / mL). A negative result must be combined with clinical observations, patient history, and epidemiological information.  Fact Sheet for Patients:   https://www.patel.info/  Fact Sheet for Healthcare Providers: https://hall.com/  This test is not yet approved or  cleared by the [place] FDA and has been authorized for detection and/or diagnosis of SARS-CoV-2 by FDA under an Emergency Use Authorization (EUA).  This EUA will remain in effect (meaning this test can be used) for the duration of the COVID-19 declaration under Section 564(b)(1) of the Act, 21 U.S.C. section 360bbb-3(b)(1), unless the authorization is terminated or revoked  sooner.  Performed at [facility], [address] [place], [place] [address]   Protime-INR     Status: Abnormal   Collection Time: 12/08/21  7:04 PM  Result Value Ref Range   Prothrombin Time 28.2 (H) 11.4 - 15.2 seconds   INR 2.7 (H) 0.8 - 1.2    Comment: (NOTE) INR goal varies based on device and disease states. Performed at [facility], [place] [address] [place], [place] [address]   Hepatic function panel     Status: Abnormal   Collection Time: 12/08/21  7:04 PM  Result Value Ref Range   Total Protein 6.4 (L) 6.5 - 8.1 g/dL   Albumin 3.6 3.5 - 5.0 g/dL   AST 14 (L) 15 - 41 U/L   ALT 8 0 - 44 U/L   Alkaline Phosphatase 101 38 - 126 U/L   Total Bilirubin 1.6 (H) 0.3 - 1.2 mg/dL   Bilirubin, Direct 0.4 (H) 0.0 - 0.2 mg/dL   Indirect Bilirubin 1.2 (H) 0.3 - 0.9 mg/dL    Comment: Performed at [facility] [address], [place], [place] [address]  Troponin I (High Sensitivity)     Status: Abnormal   Collection Time: 12/08/21 10:03 PM  Result Value Ref Range   Troponin I (High Sensitivity) 150 (HH) <18 ng/L    Comment: CRITICAL RESULT CALLED TO, READ BACK BY AND VERIFIED WITH: NEWTON J,RN 12/08/21 2300 WAYK (NOTE) Elevated high sensitivity troponin I (hsTnI) values and significant  changes across serial measurements may suggest ACS but many other  chronic and acute conditions are known to elevate hsTnI results.  Refer to the Links section for chest pain algorithms and additional  guidance. Performed at [facility], [place] [address] [place], [place] [address]   Troponin I (High Sensitivity)     Status: Abnormal   Collection Time: 12/09/21 12:15 AM  Result Value Ref Range   Troponin I (High Sensitivity) 212 (HH) <18 ng/L    Comment: CRITICAL VALUE NOTED.  VALUE IS CONSISTENT WITH PREVIOUSLY REPORTED AND CALLED VALUE. (NOTE) Elevated high sensitivity troponin I (hsTnI) values and significant  changes across serial measurements may suggest ACS but  many other  chronic and acute conditions are known to elevate hsTnI results.  Refer to the Links section for chest pain algorithms and additional  guidance. Performed at [facility], [place] [address], [place], [place] [address]   Protime-INR     Status: Abnormal   Collection Time: 12/09/21  4:27 AM  Result Value Ref Range   Prothrombin Time 28.3 (H) 11.4 - 15.2 seconds   INR 2.7 (H) 0.8 - 1.2    Comment: (NOTE) INR goal varies based on device and disease states. Performed at [facility], [place] [address], [place], [place] [address]   CBC     Status: Abnormal   Collection Time: 12/09/21  4:27 AM  Result Value Ref Range   WBC 3.5 (L) 4.0 - 10.5 K/uL   RBC 4.35 4.22 - 5.81 MIL/uL   Hemoglobin 12.3 (L) 13.0 - 17.0 g/dL   HCT 38.4 (L) 39.0 - 52.0 %   MCV 88.3 80.0 - 100.0 fL   MCH 28.3 26.0 - 34.0 pg   MCHC 32.0 30.0 - 36.0 g/dL   RDW 17.2 (H) 11.5 - 15.5 %   Platelets 85 (L) 150 - 400 K/uL    Comment: Immature Platelet Fraction may be clinically indicated, consider ordering this additional test [id number] CONSISTENT WITH PREVIOUS RESULT REPEATED  TO VERIFY    nRBC 0.0 0.0 - 0.2 %    Comment: Performed at [facility], [place] [address], [place], [place] [address]  Basic metabolic panel     Status: Abnormal   Collection Time: 12/09/21  4:27 AM  Result Value Ref Range   Sodium 138 135 - 145 mmol/L   Potassium 4.1 3.5 - 5.1 mmol/L   Chloride 105 98 - 111 mmol/L   CO2 25 22 - 32 mmol/L   Glucose, Bld 109 (H) 70 - 99 mg/dL    Comment: Glucose reference range applies only to samples taken after fasting for at least 8 hours.   BUN 18 8 - 23 mg/dL   Creatinine, Ser 1.47 (H) 0.61 - 1.24 mg/dL   Calcium 9.7 8.9 - 10.3 mg/dL   GFR, Estimated 46 (L) >60 mL/min    Comment: (NOTE) Calculated using the CKD-EPI Creatinine Equation (2021)    Anion gap 8 5 - 15    Comment: Performed at [facility] [address], [place], [place] [address]  Troponin I (High  Sensitivity)     Status: Abnormal   Collection Time: 12/09/21  4:27 AM  Result Value Ref Range   Troponin I (High Sensitivity) 319 (HH) <18 ng/L    Comment: CRITICAL VALUE NOTED.  VALUE IS CONSISTENT WITH PREVIOUSLY REPORTED AND CALLED VALUE. (NOTE) Elevated high sensitivity troponin I (hsTnI) values and significant  changes across serial measurements may suggest ACS but many other  chronic and acute conditions are known to elevate hsTnI results.  Refer to the Links section for chest pain algorithms and additional  guidance. Performed at [facility], [place] [address], [place], [place] [address]    DG Chest Portable 1 View  Result Date: 12/08/2021 CLINICAL DATA:  resp. distress EXAM: PORTABLE CHEST 1 VIEW COMPARISON:  Radiographs October 07, 2016 FINDINGS: Evaluation is limited by patient positioning. The cardiomediastinal silhouette is unchanged and enlarged in contour.Central vascular congestion without overt edema. No pleural effusion. No pneumothorax. No acute pleuroparenchymal abnormality. Visualized abdomen is unremarkable. IMPRESSION: Cardiomegaly.  Central vascular congestion without overt edema. Electronically Signed   By: Valentino Saxon M.D.   On: 12/08/2021 19:54    Pending Labs Unresulted Labs (From admission, onward)     Start     Ordered   12/09/21 0500  Protime-INR  Daily,   R      12/08/21 2158            Vitals/Pain Today's Vitals   12/09/21 0930 12/09/21 0945 12/09/21 1000 12/09/21 1015  BP: 131/81 127/76 120/78 (!) 141/67  Pulse: (!) 51 (!) 43 (!) 40 (!) 125  Resp: (!) 21 (!) 22 19 (!) 25  Temp:      TempSrc:      SpO2: 100% 100% 100% 97%  Weight:      Height:      PainSc:        Isolation Precautions No active isolations  Medications Medications  furosemide (LASIX) injection 40 mg (40 mg Intravenous Given 12/09/21 1016)  potassium chloride (KLOR-CON M) CR tablet 10 mEq (has no administration in time range)  cholecalciferol (VITAMIN D3) tablet  2,000 Units (2,000 Units Oral Given 12/09/21 1018)  polyethylene glycol (MIRALAX / GLYCOLAX) packet 17 g (17 g Oral Given 12/09/21 1018)  DULoxetine (CYMBALTA) DR capsule 20 mg (20 mg Oral Given 12/09/21 1018)  atorvastatin (LIPITOR) tablet 20 mg (20 mg Oral Given 12/09/21 0003)  acetaminophen (TYLENOL) tablet  500 mg (500 mg Oral Given 12/09/21 1018)  nitrofurantoin (macrocrystal-monohydrate) (MACROBID) capsule 100 mg (100 mg Oral Given 12/09/21 1018)  aspirin chewable tablet 81 mg (81 mg Oral Given 12/09/21 1018)  pantoprazole (PROTONIX) EC tablet 40 mg (40 mg Oral Given 12/09/21 1018)  perflutren lipid microspheres (DEFINITY) IV suspension (2 mLs Intravenous Given 12/09/21 1013)  furosemide (LASIX) injection 40 mg (40 mg Intravenous Given 12/08/21 2155)  warfarin (COUMADIN) tablet 5 mg (5 mg Oral Given 12/08/21 2246)    Mobility non-ambulatory High fall risk   Focused Assessments Pulmonary Assessment Handoff:  Lung sounds: Bilateral Breath Sounds: Expiratory wheezes L Breath Sounds: Fine crackles (Bases) R Breath Sounds: Fine crackles (Bases) O2 Device: Room Air O2 Flow Rate (L/min): 2 L/min    R Recommendations: See Admitting Provider Note

## 2021-12-09 NOTE — Assessment & Plan Note (Addendum)
Presented with troponin of 80 and initially thought could be due to demand ischemia but this has continually risen up to 200 concerning for NSTEMI which more convincingly explains his symptoms of chest pain and hypoxia since chest x-ray did not show overt pulmonary edema and his lower extremity edema is at baseline.  - However he is therapeutic with INR 2.7 on warfarin and has thrombocytopenia of 85. Discussed with cardiology fellow Dr. Renella Cunas regarding start of heparin and he will see the pt in consultation tonight and he will place order if needed. Appreciate cardiology help.

## 2021-12-09 NOTE — ED Notes (Addendum)
Cardiologist provider at bedside

## 2021-12-09 NOTE — ED Notes (Signed)
Wife has arrived, currently at the bedside

## 2021-12-09 NOTE — Consult Note (Addendum)
Cardiology Consultation:   Patient ID: BRICK KETCHER MRN: [id number]; DOB: [birth year]  Admit date: 12/08/2021 Date of Consult: 12/09/2021  Primary Care Provider: [name], MD [facility] Cardiologist: Sinclair Grooms, MD  [place] Electrophysiologist:  None   Patient Profile:   Peter Singh is a 88 y.o. male with dementia, suprapubic catheter, recurrent UTI, BPH, renal cancer, CKD III, HFpEF, HLD, HTN, and OA who is being seen today for the evaluation of elevated hsT and chest pain at the request of Dr. Ileene Musa (hospitalist).  History of Present Illness:   Peter Singh was brought in by EMS from his assisted living facility with what was reported as chest pain associated shortness of breath.  He had received aspirin 324 mg and 1 nitroglycerin at his facility by EMS for hypertension.  EMS reported O2 sats of 82% with lung exam c/w fluid overload with rales along with lower extremity edema.   On arrival to the emergency department the patient was pleasantly demented and denied any acute complaints.  He did not recall any chest pain, shortness of breath or difficulty breathing.  VS on arrival to the ED: P 107, BP 145/66, RR 19  History from chart review as the wife provided a limited history and she was notified by the nursing facility that he was complaining of chest pain and had hypoxia.  He has no prior history of known coronary artery disease or previous MI.  Chronic lower extremity edema that is similar to baseline.  There were a few cases of COVID and slowly 2 weeks prior.  He was initially put on nonrebreather at 15 L by EMS and was weaned down to 2 L by ED arrival. He was mildly hypoxic on RA at 90%.  He was afebrile and mildly tachycardic following AF/RVR but otherwise normotensive.   Lab work notable for mildly elevated BNP (304), hsT elevation (86->150->212).  Chronic thrombocytopenia with platelets at 85k.  Chest x-ray with cardiomegaly but no overt edema and  only central vascular congestion.  COVID-negative.  INR 2.7 (1.86-3.19 over preceding 3 months).   Mr. Lober was last seen by cardiology with Dr. Daneen Schick on 10/13/19 for management of permanent atrial fibrillation, stroke risk reduction, hypertension, and HFpEF.  No changes to his medication regimen were made at that time and he was continued on warfarin for stroke risk reduction.  He has been in permanent atrial fibrillation for years.  Usually self rate controlled with ventricular rates between 60-70.  He is not currently on any AV nodal blocking agents or antiarrhythmic medications.  During my evaluation he was resting comfortably in bed and was oriented to person, place, and month but not year.  He did not know why he was in the hospital and said maybe he was wanting for surgery or operation.  He did not recall any chest pain, chest pressure, shortness of breath or dyspnea on exertion however this is obviously limited by his significant dementia.  VS during my evaluation: P 61 (rate controlled AF), BP 109/61 (75), RR 21, O2 98%/2 L [place]  D/c of 2 L [place] supplemental O2 and O2 sats maintained at ~95% on RA.   Telemetry reviewed: AF/RVR rates 100-110 on initial presentation which slowly down trended back to baseline AF with ventricular rates 60-70.   Past Medical History:  Diagnosis Date   Adult failure to thrive    Per incoming records from [facility]   Allergic rhinitis  Per incoming records from [facility]   Atrial fibrillation [facility])    BPH (benign prostatic hyperplasia)    Per incoming records from [place] of kidney [facility])    Right, Per incoming records from [place]    Per incoming records from [facility]   Cerebrovascular accident, late effects    Per incoming records from [place] kidney disease, stage III (moderate) ([place])    Per incoming  records from [place] [facility])    Per incoming records from [facility]   Cognitive impairment    Mild, Per incoming records from [facility]   Constipation    Per incoming records from [facility]   Dementia [facility])    Diarrhea    Better off Aricept, Per incoming records from [facility]   Diastolic dysfunction    on 2D Echo 07/2009 and 2016, Per incoming records from [facility]   Diverticulosis    Mild, Per incoming records from [facility]   Diverticulosis of colon (without mention of hemorrhage)    ED (erectile dysfunction)    Per incoming records from [facility]   History of Coumadin therapy    Per incoming records from [facility]   History of echocardiogram 09/2014   Per incoming records from [facility]   Hyperlipemia    Hypertension    Kidney mass    Per incoming records from [place] leg edema    Per incoming records from [place] [facility])    Neuropathy    Per incoming records from [place] (osteoarthritis)    Per incoming records from [facility]   Peripheral neuropathy    Per incoming records from [place] history of colonic [place] & 2004   adenomatous polyps   Pulmonary arterial hypertension ([place])    Per incoming records from [place]    Per incoming records from [facility]   Thrombocytopenia [facility]) 2017   Per incoming records from [facility]   UTI (urinary tract infection)    Sepsis, Per incoming records from [place] as ambulation aid    Per incoming records from [facility]   Past Surgical History:  Procedure Laterality Date    APPENDECTOMY     Per incoming records from [place]     Per incoming records from [place]  11/21/2019   KNEE SURGERY     right   PILONIDAL CYST DRAINAGE     POLYPECTOMY     Per incoming records from [place] tumor lumbar spine     SPINE SURGERY     tumor removed   THORACIC 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, Per incoming records from [place] Medications:  Prior to Admission medications   Medication Sig Start Date End Date Taking? Authorizing Provider  acetaminophen (TYLENOL) 500 MG tablet Take 500 mg by mouth 2 (two) times daily. Scheduled and every 4 hours as needed (ending 05/05/2019)Document area of discomfort and effectiveness of medication.  Yes [provider]  atorvastatin (LIPITOR) 20 MG tablet Take 20 mg by mouth every evening.   Yes [provider]  cholecalciferol (VITAMIN D3) 25 MCG (1000 UT) tablet Take 2,000 Units by mouth daily.    Yes [provider]  DULoxetine (CYMBALTA) 20 MG capsule Take 20 mg by mouth daily.   Yes [provider]  furosemide (LASIX) 20 MG tablet Take 20 mg by mouth. Once A Day on Tue, Thu, Sat   Yes [provider]  methenamine (MANDELAMINE) 1 g tablet Take 1,000 mg by mouth 2 (two) times daily.   Yes [provider]  mupirocin ointment (BACTROBAN) 2 % 1 application. 2 (two) times daily.   Yes [provider]  nitrofurantoin, macrocrystal-monohydrate, (MACROBID) 100 MG capsule Take 100 mg by mouth 2 (two) times daily.   Yes [provider]  polyethylene glycol (MIRALAX / GLYCOLAX) 17 g packet Take 17 g by mouth daily.   Yes [provider]  potassium chloride (MICRO-K) 10 MEQ CR capsule Take 10 mEq by mouth. Once A Day on Tue, Thu, Sat   Yes [provider]  triamcinolone lotion (KENALOG) 0.1 % Apply 1 application topically 2 (two) times daily as needed.    Yes [provider]  warfarin (COUMADIN) 5 MG tablet Take 5 mg by mouth. Once A Day on Sun, Tue, Thu, Sat   Yes [provider]  warfarin (COUMADIN) 4 MG tablet Take 4 mg by mouth. Once A Day on Mon, Wed, Fri    [provider]   Inpatient Medications: Scheduled Meds:  acetaminophen  500 mg Oral BID   aspirin  81 mg Oral Daily   atorvastatin  20 mg Oral QPM   cholecalciferol  2,000 Units Oral Daily   DULoxetine  20 mg Oral Daily   furosemide  40 mg Intravenous Daily   mupirocin ointment   Nasal BID   nitrofurantoin (macrocrystal-monohydrate)  100 mg Oral Q12H   pantoprazole  40 mg Oral Daily   polyethylene glycol  17 g Oral Daily   [START ON 12/10/2021] potassium chloride  10 mEq Oral Q T,Th,Sat-1800   Warfarin - Pharmacist Dosing Inpatient   Does not apply q1600   Continuous Infusions:  PRN Meds:  Allergies:    Allergies  Allergen Reactions   Penicillins Rash    Has patient had a PCN reaction causing immediate rash, facial/tongue/throat swelling, SOB or lightheadedness with hypotension: unknown Has patient had a PCN reaction causing severe rash involving mucus membranes or skin necrosis: unknown Has patient had a PCN reaction that required hospitalization : unknown Has patient had a PCN reaction occurring within the last 10 years: no If all of the above answers are "NO", then may proceed with Cephalosporin use.    Social History:   Social History   Socioeconomic History   Marital status: Married    Spouse name: Not on file   Number of children: 2   Years of education: Not on file   Highest education level: Bachelor's degree (e.g., BA, AB, BS)  Occupational History   Occupation: retired  Tobacco Use   Smoking status: Former    Packs/day: 3.00    Types: Cigarettes    Quit date: 06/09/1975    Years since quitting: 46.5   Smokeless tobacco:  Never  Vaping Use   Vaping Use: Never used  Substance and Sexual Activity   Alcohol use: Yes    Alcohol/week: 0.0 standard drinks    Comment:  2-3 drinks per day   Drug use: No   Sexual activity: Not on file  Other Topics Concern   Not on file  Social History Narrative   Patient is married with 2 children, 3 grandchildren    Patient is retired Engineer, maintenance (IT)   Patient lives at [place] Strain: Not on file  Food Insecurity: Not on file  Transportation Needs: Not on file  Physical Activity: Not on file  Stress: Not on file  Social Connections: Not on file  Intimate Partner Violence: Not on file    Family History:    Family History  Problem Relation Age of Onset   Stroke Father    CVA Father    Tuberculosis Father    Neuropathy Neg Hx    ROS:  Review of Systems: [y] = yes, '[ ]'$  = no      General: Weight gain '[ ]'$ ; Weight loss '[ ]'$ ; Anorexia '[ ]'$ ; Fatigue '[ ]'$ ; Fever '[ ]'$ ; Chills '[ ]'$ ; Weakness '[ ]'$    Cardiac: Chest pain/pressure [y]; Resting SOB [y]; Exertional SOB '[ ]'$ ; Orthopnea '[ ]'$ ; Pedal Edema '[ ]'$ ; Palpitations '[ ]'$ ; Syncope '[ ]'$ ; Presyncope '[ ]'$ ; Paroxysmal nocturnal dyspnea '[ ]'$    Pulmonary: Cough '[ ]'$ ; Wheezing '[ ]'$ ; Hemoptysis '[ ]'$ ; Sputum '[ ]'$ ; Snoring '[ ]'$    GI: Vomiting '[ ]'$ ; Dysphagia '[ ]'$ ; Melena '[ ]'$ ; Hematochezia '[ ]'$ ; Heartburn '[ ]'$ ; Abdominal pain '[ ]'$ ; Constipation '[ ]'$ ; Diarrhea '[ ]'$ ; BRBPR '[ ]'$    GU: Hematuria '[ ]'$ ; Dysuria '[ ]'$ ; Nocturia '[ ]'$  Vascular: Pain in legs with walking '[ ]'$ ; Pain in feet with lying flat '[ ]'$ ; Non-healing sores '[ ]'$ ; Stroke '[ ]'$ ; TIA '[ ]'$ ; Slurred speech '[ ]'$ ;   Neuro: Headaches '[ ]'$ ; Vertigo '[ ]'$ ; Seizures '[ ]'$ ; Paresthesias '[ ]'$ ;Blurred vision '[ ]'$ ; Diplopia '[ ]'$ ; Vision changes '[ ]'$    Ortho/Skin: Arthritis '[ ]'$ ; Joint pain '[ ]'$ ; Muscle pain '[ ]'$ ; Joint swelling '[ ]'$ ; Back Pain '[ ]'$ ; Rash '[ ]'$    Psych: Depression '[ ]'$ ; Anxiety '[ ]'$    Heme: Bleeding problems '[ ]'$ ; Clotting disorders '[ ]'$ ; Anemia '[ ]'$    Endocrine: Diabetes '[ ]'$ ; Thyroid  dysfunction '[ ]'$    Physical Exam/Data:   Vitals:   12/09/21 0220 12/09/21 0330 12/09/21 0345 12/09/21 0400  BP: 107/63 90/62 109/61 113/67  Pulse: (!) 31 70 (!) 37 67  Resp: (!) 21 19 (!) 22 19  Temp:      TempSrc:      SpO2: 96% 96% 97% 97%  Weight:      Height:        Intake/Output Summary (Last 24 hours) at 12/09/2021 0418 Last data filed at 12/09/2021 0008 Gross per 24 hour  Intake --  Output 450 ml  Net -450 ml      12/08/2021    8:00 PM 12/05/2021   11:44 AM 11/28/2021    9:48 AM  Last 3 Weights  Weight (lbs) 265 lb 262 lb 260 lb 11.2 oz  Weight (kg) 120.203 kg 118.842 kg 118.253 kg     Body mass index is 29.85 kg/m.  General:  Well nourished, well developed, in no acute distress HEENT: normal Lymph: no adenopathy Neck: no JVD Endocrine:  No thryomegaly Vascular: No carotid bruits; FA pulses 2+ bilaterally without bruits  Cardiac: rate controlled, irregular rhythm, 3/6 systolic murmur over LLSB and apex Lungs:  clear to auscultation  bilaterally, no wheezing, rhonchi or rales  Abd: soft, nontender, no hepatomegaly  Ext: no edema Musculoskeletal:  No deformities, BUE and BLE strength normal and equal Skin: warm and dry  Neuro:  CNs 2-12 intact, no focal abnormalities noted Psych:  Normal affect   EKG:  The EKG was personally reviewed (12/08/21, 19:14:40) and demonstrates: AF/RVR with ventricular rate of 116 bpm, QRS 99 ms, Qtc 489 ms, no ischemic changes  Telemetry:  Telemetry was personally reviewed and demonstrates: AF/RVR rates 100-110 on initial presentation which slowly down trended back to baseline AF with ventricular rates 60-70.   Relevant CV Studies:  TTE Result date: 09/06/14 - Left ventricle: The cavity size was normal. There was moderate    concentric hypertrophy. Systolic function was normal. The    estimated ejection fraction was in the range of 55% to 60%. Wall    motion was normal; there were no regional wall motion    abnormalities.  - Left  atrium: The atrium was severely dilated.  - Right ventricle: The cavity size was mildly dilated.  - Right atrium: The atrium was moderately to severely dilated.  - Atrial septum: No defect or patent foramen ovale was identified.  - Tricuspid valve: There was mild-moderate regurgitation. There was    evidence of perivalvular regurgitation.  - Pulmonary arteries: PA peak pressure: 32 mm Hg (S).   Laboratory Data:  High Sensitivity Troponin:   Recent Labs  Lab 12/08/21 1904 12/08/21 2203 12/09/21 0015  TROPONINIHS 86* 150* 212*     Chemistry Recent Labs  Lab 12/08/21 1904  NA 136  K 4.2  CL 105  CO2 23  GLUCOSE 135*  BUN 18  CREATININE 1.23  CALCIUM 9.7  GFRNONAA 57*  ANIONGAP 8    Recent Labs  Lab 12/08/21 1904  PROT 6.4*  ALBUMIN 3.6  AST 14*  ALT 8  ALKPHOS 101  BILITOT 1.6*   Hematology Recent Labs  Lab 12/08/21 1904  WBC 4.7  RBC 4.65  HGB 13.1  HCT 41.1  MCV 88.4  MCH 28.2  MCHC 31.9  RDW 17.2*  PLT 85*   BNP Recent Labs  Lab 12/08/21 1904  BNP 304.1*    DDimer No results for input(s): DDIMER in the last 168 hours.  Radiology/Studies:  DG Chest Portable 1 View  Result Date: 12/08/2021 CLINICAL DATA:  resp. distress EXAM: PORTABLE CHEST 1 VIEW COMPARISON:  Radiographs October 07, 2016 FINDINGS: Evaluation is limited by patient positioning. The cardiomediastinal silhouette is unchanged and enlarged in contour.Central vascular congestion without overt edema. No pleural effusion. No pneumothorax. No acute pleuroparenchymal abnormality. Visualized abdomen is unremarkable. IMPRESSION: Cardiomegaly.  Central vascular congestion without overt edema. Electronically Signed   By: Valentino Saxon M.D.   On: 12/08/2021 19:54    TIMI Risk Score for Unstable Angina or Non-ST Elevation MI:   The patient's TIMI risk score is 3, which indicates a 13% risk of all cause mortality, new or recurrent myocardial infarction or need for urgent revascularization in the  next 14 days.{  Assessment and Plan:   Troponin elevation  The troponin elevation is difficult to assess with limited history complicated by having a significant dementia however with a moderately uptrending troponin would be either a type I event versus HFpEF exacerbation in the setting of AF/RVR.  He was reportedly hypoxic prior to admission and with furosemide 40 mg IV x1 and was able to wean off of oxygen.  His AF/RVR only had mildly elevated ventricular rates however  his baseline atrial fibrillation and self rate controlled in the 70s without nodal blocking agents or rhythm medications.  While he did have a mildly elevated BNP his chest x-ray and physical exam were not consistent with significant CHF exacerbation although my evaluation was following Lasix.  His history is completely unreliable in the setting of dementia.  Although he is oriented to himself and place and sometimes month he has very limited short-term memory and often ask the same question multiple times over. CAD risk factors include hyperlipidemia, hypertension, and age.  He is on chronic warfarin therapy for atrial fibrillation.  Since we have identified another cause for his troponin elevation other than possible HFpEF exacerbation which does not seem consistent with his current clinical exam I would preference treating him for a type I event since at least on chart documentation chest pain and shortness of breath were his primary symptoms reported by staff.  He is already received aspirin 324 mg p.m. with EMS.  Will discontinue warfarin and start heparin for medical treatment of NSTEMI.  He has had no recent bleeding events but does have chronic thrombocytopenia so we will have to monitor this on heparin. Additionally aspirin 81 mg daily while on heparin.  I will plan to transition him to apixaban 5 mg twice daily (25M, 120.2 kg, sCr 1.2) prior to discharge if this is affordable with his insurance.  If not transition back to warfarin as  he has had relatively well-controlled INRs over the past year.  Echo to assess for wall motion abnormalities.  I would favor conservative treatment of NSTEMI and would defer coronary assessment given his comorbidities, limited functional ability, baseline dementia, current lack of symptoms and questionable presentation. Start protonix while on heparin/ASA.   AF/RVR  He has been in permanent atrial fibrillation for years and usually is self rate controlled with ventricular rate 60-70.  CHA2DS2-VASc (4) for 25M with HTN and HFpEF.  I would preference transition to apixaban as above if affordable.  If not continue warfarin at discharge.  HLD LDL 80, TC 129 on 02/0/3/22 - continue home atorva 20 mg qhs  HTN  Normotensive now off all BP meds  Dementia  His dementia is pretty severe however he is conversant at baseline and appears more present mentally then would be expected. 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  He is obviously able to compensate however has significant difficulty with short-term memory and often repeats questions within minutes of each other.  For questions or updates, please contact [place] Please consult [email or web address] for contact info under   Signed, Dion Body, MD  12/09/2021 4:18 AM

## 2021-12-09 NOTE — Progress Notes (Addendum)
Agree, we should not chase the Troponin I given the current co-morbidities. Not grossly wet. Care with diuresis to avoid dehydration/AKI. Awaiting echo. Has an ?aortic stenosis murmur.

## 2021-12-09 NOTE — Plan of Care (Signed)
  Problem: Safety: Goal: Ability to remain free from injury will improve Outcome: Not Progressing  Pt. Confused and attempting to get out of bed. Sitter at bedside.

## 2021-12-10 DIAGNOSIS — N3000 Acute cystitis without hematuria: Secondary | ICD-10-CM | POA: Diagnosis not present

## 2021-12-10 DIAGNOSIS — F0390 Unspecified dementia without behavioral disturbance: Secondary | ICD-10-CM | POA: Diagnosis not present

## 2021-12-10 DIAGNOSIS — I10 Essential (primary) hypertension: Secondary | ICD-10-CM | POA: Diagnosis not present

## 2021-12-10 DIAGNOSIS — I4811 Longstanding persistent atrial fibrillation: Secondary | ICD-10-CM | POA: Diagnosis not present

## 2021-12-10 DIAGNOSIS — J9601 Acute respiratory failure with hypoxia: Secondary | ICD-10-CM | POA: Diagnosis not present

## 2021-12-10 LAB — BASIC METABOLIC PANEL
Anion gap: 9 (ref 5–15)
BUN: 19 mg/dL (ref 8–23)
CO2: 23 mmol/L (ref 22–32)
Calcium: 9.9 mg/dL (ref 8.9–10.3)
Chloride: 106 mmol/L (ref 98–111)
Creatinine, Ser: 1.4 mg/dL — ABNORMAL HIGH (ref 0.61–1.24)
GFR, Estimated: 49 mL/min — ABNORMAL LOW (ref >60)
Glucose, Bld: 112 mg/dL — ABNORMAL HIGH (ref 70–99)
Potassium: 4.1 mmol/L (ref 3.5–5.1)
Sodium: 138 mmol/L (ref 135–145)

## 2021-12-10 LAB — PROTIME-INR
INR: 2.3 — ABNORMAL HIGH (ref 0.8–1.2)
Prothrombin Time: 25.2 seconds — ABNORMAL HIGH (ref 11.4–15.2)

## 2021-12-10 MED ORDER — WARFARIN - PHARMACIST DOSING INPATIENT: Freq: Every day | Status: DC

## 2021-12-10 MED ORDER — WARFARIN SODIUM 3 MG PO TABS
6.0000 mg | ORAL_TABLET | Freq: Once | ORAL | Status: AC
Start: 2021-12-10 — End: 2021-12-10
  Administered 2021-12-10: 6 mg via ORAL
  Filled 2021-12-10: qty 2

## 2021-12-10 NOTE — Progress Notes (Signed)
[place] for warfarin Indication: Afib  Allergies  Allergen Reactions   Penicillins Rash    Has patient had a PCN reaction causing immediate rash, facial/tongue/throat swelling, SOB or lightheadedness with hypotension: unknown Has patient had a PCN reaction causing severe rash involving mucus membranes or skin necrosis: unknown Has patient had a PCN reaction that required hospitalization : unknown Has patient had a PCN reaction occurring within the last 10 years: no If all of the above answers are "NO", then may proceed with Cephalosporin use.     Patient Measurements: Height: '6\' 7"'$  (200.7 cm) Weight: 117.1 kg (258 lb 2.5 oz) IBW/kg (Calculated) : 93.7 Vital Signs: Temp: 98.5 F (36.9 C) (06/06 1140) Temp Source: Oral (06/06 1140) BP: 148/66 (06/06 0702) Pulse Rate: 93 (06/06 1140)  Labs: Recent Labs    12/08/21 1904 12/08/21 2203 12/09/21 0015 12/09/21 0427 12/10/21 0417  HGB 13.1  --   --  12.3*  --   HCT 41.1  --   --  38.4*  --   PLT 85*  --   --  85*  --   LABPROT 28.2*  --   --  28.3* 25.2*  INR 2.7*  --   --  2.7* 2.3*  CREATININE 1.23  --   --  1.47* 1.40*  TROPONINIHS 86* 150* 212* 319*  --      Estimated Creatinine Clearance: 54.2 mL/min (A) (by C-G formula based on SCr of 1.4 mg/dL (H)).   Medical History: Past Medical History:  Diagnosis Date   Adult failure to thrive    Per [facility]   Allergic rhinitis    Per incoming records from [facility]   Atrial fibrillation [facility])    BPH (benign prostatic hyperplasia)    Per incoming records from [place] of kidney [facility])    Right, Per incoming records from [place]    Per [facility] records from [facility]   Cerebrovascular accident, late effects    Per incoming records from [place] kidney disease, stage  III (moderate) ([place])    Per incoming records from [place] [facility])    Per incoming records from [facility]   Cognitive impairment    Mild, Per incoming records from [facility]   Constipation    Per incoming records from [facility]   Dementia [facility])    Diarrhea    Better off Aricept, Per incoming records from [facility]   Diastolic dysfunction    on 2D Echo 07/2009 and 2016, Per incoming records from [facility]   Diverticulosis    Mild, Per incoming records from [facility]   Diverticulosis of colon (without mention of hemorrhage)    ED (erectile dysfunction)    Per incoming records from [facility]   History of Coumadin therapy    Per incoming records from [facility]   History of echocardiogram 09/2014   Per incoming records from [facility]   Hyperlipemia    Hypertension    Kidney mass    Per incoming records from [place] leg edema    Per incoming records from [place] [facility])    Neuropathy    Per incoming records from [facility]   OA (osteoarthritis)    Per  incoming records from [facility]   Peripheral neuropathy    Per incoming records from [place] history of colonic [place] & 2004   adenomatous polyps   Pulmonary arterial hypertension ([place])    Per incoming records from [place]    Per incoming records from [facility]   Thrombocytopenia [facility]) 2017   Per incoming records from [facility]   UTI (urinary tract infection)    Sepsis, Per incoming records from [place] hypertrophy    [place] Rile as ambulation aid    Per incoming records from [place]: 88 y/o M with mildly elevated troponin. Pt on warfarin PTA for hx AF, held on admission with possible ACS. Cardiology not planning invasive workup, warfarin to resume.  INR therapeutic at 2.3, last dose 6/4 so will give slight boost.  Home warfarin dose '5mg'$  TTSS, '4mg'$  MWF  Goal of Therapy:  INR 2-3 Monitor platelets by anticoagulation protocol: Yes   Plan:  Warfarin '6mg'$  PO x1 Daily protime  Arrie Senate, PharmD, BCPS, [facility] Clinical Pharmacist [phone] Please check AMION for all [facility] Pharmacy numbers 12/10/2021

## 2021-12-10 NOTE — Evaluation (Signed)
Physical Therapy Evaluation Patient Details Name: Peter Singh MRN: [id number] DOB: 1932/04/19 Today's Date: 12/10/2021  History of Present Illness  Patient is a 88 y/o male who presents on 12/08/21 with SOB and chest pain. Admitted with acute hypoxic respiratory failure, NSTEMI and acute on chronic diastolic heart failure exacerbation. + UTI. PMH includes recurrent UTIs, suprapubic catheter, A-fib, dementia, thrombrocytopenia, CKD, HTN.  Clinical Impression  Patient presents with generalized weakness, impaired balance, baseline cognitive deficits due to hx of dementia and impaired mobility s/p above. Pt is from [facility] and requires assist for ADLs at baseline, uses RW for ambulation and participates in activities daily. Pt's wife lives in the independent portion and sees pt daily. Today, pt requires heavy Mod A for bed mobility and Min A for transfers and gait training with use of RW for support. Fatigues during activity. Per wife, pt is a lot weaker than baseline. Would benefit from PT at SNF to maximize independence and mobility so pt can return to PLOF. Will follow acutely.       Recommendations for follow up therapy are one component of a multi-disciplinary discharge planning process, led by the attending physician.  Recommendations may be updated based on patient status, additional functional criteria and insurance authorization.  Follow Up Recommendations Skilled nursing-short term rehab (<3 hours/day)    Assistance Recommended at Discharge Frequent or constant Supervision/Assistance  Patient can return home with the following  A lot of help with walking and/or transfers;A little help with bathing/dressing/bathroom;Direct supervision/assist for financial management;Direct supervision/assist for medications management;Assistance with cooking/housework;Assist for transportation    Equipment Recommendations None recommended by PT  Recommendations for Other Services        Functional Status Assessment Patient has had a recent decline in their functional status and demonstrates the ability to make significant improvements in function in a reasonable and predictable amount of time.     Precautions / Restrictions Precautions Precautions: Fall Precaution Comments: suprapubic cath Restrictions Weight Bearing Restrictions: No      Mobility  Bed Mobility Overal bed mobility: Needs Assistance Bed Mobility: Supine to Sit, Sit to Supine     Supine to sit: Mod assist, HOB elevated Sit to supine: Min assist   General bed mobility comments: Assist with trunk to get to EOB and scooting bottom with increased time, posterior bias. Min A to bring RLE into bed and to reposition    Transfers Overall transfer level: Needs assistance Equipment used: Rolling walker (2 wheels) Transfers: Sit to/from Stand Sit to Stand: Min assist, From elevated surface           General transfer comment: Min A to power to standing with cues for hand placement/technique as pt trying to pull up on RW to rise. Cues for upright posture.    Ambulation/Gait Ambulation/Gait assistance: Min assist Gait Distance (Feet): 40 Feet Assistive device: Rolling walker (2 wheels) Gait Pattern/deviations: Step-through pattern, Decreased stride length, Trunk flexed Gait velocity: decreased Gait velocity interpretation: <1.31 ft/sec, indicative of household ambulator   General Gait Details: Slow, mildly unsteady gait with decreased step lengths bilaterally. Cues for RW management for turns and to keep feet within Rw. No SOB noted. VSS.  Stairs            Wheelchair Mobility    Modified Rankin (Stroke Patients Only)       Balance Overall balance assessment: Needs assistance Sitting-balance support: Feet supported, Bilateral upper extremity supported Sitting balance-Leahy Scale: Poor Sitting balance - Comments: Requires UE support,  posterior bias. Postural control: Posterior  lean Standing balance support: During functional activity, Reliant on assistive device for balance Standing balance-Leahy Scale: Poor Standing balance comment: Requires UE support in standing.                             Pertinent Vitals/Pain Pain Assessment Pain Assessment: Faces Faces Pain Scale: No hurt    Home Living Family/patient expects to be discharged to:: Skilled nursing facility [facility])                 Home Equipment: Rolling Walker (2 wheels);Grab bars - toilet;BSC/3in1;Rollator (4 wheels) Additional Comments: resides at SNF with sitter 2 days/a week for ADL's bathing as needed    Prior Function Prior Level of Function : Needs assist  Cognitive Assist : ADLs (cognitive)   ADLs (Cognitive): Step by step cues       Mobility Comments: pt's aides reports pt was using RW for mobility requiring occasional cues and at most CGA for transers at baseline. Walks to hall and does activities. Walks outside with wife who lives in ILF ADLs Comments: A for all components of ADLs including bathing, dressing and medication from aide 2x/week or staff the remaining part of time.     Hand Dominance   Dominant Hand: Right    Extremity/Trunk Assessment   Upper Extremity Assessment Upper Extremity Assessment: Defer to OT evaluation    Lower Extremity Assessment Lower Extremity Assessment: Generalized weakness    Cervical / Trunk Assessment Cervical / Trunk Assessment: Normal  Communication   Communication: No difficulties  Cognition Arousal/Alertness: Awake/alert Behavior During Therapy: WFL for tasks assessed/performed Overall Cognitive Status: History of cognitive impairments - at baseline                                 General Comments: dementia baseline, follows commands well. Knows where to find the calendar to look for date.        General Comments General comments (skin integrity, edema, etc.): Wife and sitter present.     Exercises     Assessment/Plan    PT Assessment Patient needs continued PT services  PT Problem List Decreased strength;Decreased mobility;Decreased safety awareness;Decreased cognition;Decreased balance;Decreased activity tolerance       PT Treatment Interventions Therapeutic activities;Gait training;Balance training;Therapeutic exercise;Patient/family education;DME instruction;Functional mobility training    PT Goals (Current goals can be found in the Care Plan section)  Acute Rehab PT Goals Patient Stated Goal: to go back home PT Goal Formulation: With patient/family Time For Goal Achievement: 12/24/21 Potential to Achieve Goals: Good    Frequency Min 2X/week     Co-evaluation               AM-PAC PT "6 Clicks" Mobility  Outcome Measure Help needed turning from your back to your side while in a flat bed without using bedrails?: A Lot Help needed moving from lying on your back to sitting on the side of a flat bed without using bedrails?: A Lot Help needed moving to and from a bed to a chair (including a wheelchair)?: A Little Help needed standing up from a chair using your arms (e.g., wheelchair or bedside chair)?: A Little Help needed to walk in hospital room?: Total Help needed climbing 3-5 steps with a railing? : Total 6 Click Score: 12    End of Session Equipment Utilized During Treatment: Gait  belt Activity Tolerance: Patient tolerated treatment well Patient left: in bed;with call bell/phone within reach;with bed alarm set;with family/visitor present;with nursing/sitter in room Nurse Communication: Mobility status PT Visit Diagnosis: Muscle weakness (generalized) (M62.81);Unsteadiness on feet (R26.81);Difficulty in walking, not elsewhere classified (R26.2)    Time: 5790-3833 PT Time Calculation (min) (ACUTE ONLY): 17 min   Charges:   PT Evaluation $PT Eval Moderate Complexity: 1 Mod          Marisa Severin, PT, DPT Acute Rehabilitation Services Secure  chat preferred Office [phone]     Marguarite Arbour A Eiliyah Reh 12/10/2021, 2:41 PM

## 2021-12-10 NOTE — Progress Notes (Signed)
Pt discharging back to [facility]. Report called to nurse Reita May  [phone]) who will be receiving him back to skilled hall 2, room 142. Waiting PTAR to arrive and transport pt to facility. Wife at bedside and aware of plan.

## 2021-12-10 NOTE — TOC Transition Note (Signed)
Transition of Care [facility]) - CM/SW Discharge Note   Patient Details  Name: Peter Singh MRN: [id number] Date of Birth: 24-Feb-1932  Transition of Care [facility]) CM/SW Contact:  [name], [place] Phone Number: 12/10/2021, 3:14 PM   Clinical Narrative:     Patient will DC to: [facility] Anticipated DC date: 12/10/21 Family notified: Spouse [name] Transport by: Corey Harold   Per MD patient ready for DC to [facility]. RN, patient, patient's family, and facility notified of DC. Discharge Summary and FL2 sent to facility. RN to call report prior to discharge ([phone], Skilled [facility] [facility] Room 142. ). DC packet on chart. Ambulance transport requested for patient.   CSW will sign off for now as social work intervention is no longer needed. Please consult [place] again if new needs arise.   Final next level of care: Skilled Nursing Facility Barriers to Discharge: No Barriers Identified   Patient Goals and CMS Choice        Discharge Placement              Patient chooses bed at: [facility] Patient to be transferred to facility by: ptar Name of family member notified: Spouse [name] Patient and family notified of of transfer: 12/10/21  Discharge Plan and Services                                     Social Determinants of Health (SDOH) Interventions     Readmission Risk Interventions     View : No data to display.

## 2021-12-10 NOTE — Progress Notes (Signed)
Heart Failure Navigator Progress Note  Assessed for Heart & Vascular TOC clinic readiness.  Patient does not meet criteria at this time due to dementia.     Earnestine Leys, BSN, Clinical cytogeneticist Only

## 2021-12-10 NOTE — Evaluation (Signed)
Occupational Therapy Evaluation Patient Details Name: Peter Singh MRN: [id number] DOB: 03-Mar-1932 Today's Date: 12/10/2021   History of Present Illness Patient is a 88 y/o male who presents on 12/08/21 with SOB and chest pain. Admitted with acute hypoxic respiratory failure, NSTEMI and acute on chronic diastolic heart failure exacerbation. + UTI. PMH includes recurrent UTIs, suprapubic catheter, A-fib, dementia, thrombrocytopenia, CKD, HTN.   Clinical Impression   Pt received while finishing bath with Aide/CNA, agreeable and pleasant to session. Oriented to self and parts of situation but otherwise minimal verbalizations throughout session. Per pt's typical aide who was present in room, he uses RW for mobility, received A from caregiver from aide 2x/week for bathing and all other needed activities and interactions. No falls to report and is typically able to ambulate in room and hallway without physical assist from staff. Pt tolerated in room mobility, seated and standing ADL's of bathing, dressing and toileting with increased A up to mod-max A. Pt at this time presents with decreased strength, balance and activity tolerance requiring multiple rest breaks throughout session. O2 sustaining 90-92% with rest and activity on RA. With in room mobility pt with mild R lean requiring min A for correction and persistent cues for management of RW. RN made aware of status at end of session. Pt would be appropriate for return to facility with post acute OT services consulted to maximize indep and safety and decrease risk of falls and burden of care.  OT will continue to see acutely.      Recommendations for follow up therapy are one component of a multi-disciplinary discharge planning process, led by the attending physician.  Recommendations may be updated based on patient status, additional functional criteria and insurance authorization.   Follow Up Recommendations  Skilled nursing-short term rehab (<3  hours/day)    Assistance Recommended at Discharge Frequent or constant Supervision/Assistance  Patient can return home with the following A little help with walking and/or transfers;A lot of help with bathing/dressing/bathroom    Functional Status Assessment  Patient has had a recent decline in their functional status and demonstrates the ability to make significant improvements in function in a reasonable and predictable amount of time.  Equipment Recommendations  None recommended by OT (has all appropriate medical equip)    Recommendations for Other Services       Precautions / Restrictions Precautions Precautions: Fall Precaution Comments: suprapubic cath Restrictions Weight Bearing Restrictions: No      Mobility Bed Mobility               General bed mobility comments: receieved standing at recliner with staff    Transfers Overall transfer level: Needs assistance Equipment used: Rolling walker (2 wheels), 1 person hand held assist Transfers: Sit to/from Stand, Bed to chair/wheelchair/BSC Sit to Stand: Mod assist Stand pivot transfers: Min assist         General transfer comment: cues for sequencing and STS completed from standard height surface wtih consistent mod A required for safe completion of transfer. pt tolerated short bout of functional mobility recliner<>bathroom for toileting with min A and cues for attention and sequencing of task. mild fatigue observed leading to need for rest breaks. encouragement of staff for use of BSC for safety at this time.      Balance Overall balance assessment: Mild deficits observed, not formally tested  ADL either performed or assessed with clinical judgement   ADL Overall ADL's : Needs assistance/impaired                 Upper Body Dressing : Minimal assistance;Sitting Upper Body Dressing Details (indicate cue type and reason): to donn/doff gown in  recliner Lower Body Dressing: Maximal assistance;Cueing for sequencing Lower Body Dressing Details (indicate cue type and reason): Max A donning socks in sitting, mod A for standing clothing management with cues for sequencing Toilet Transfer: Moderate assistance;Rolling walker (2 wheels);Cueing for safety;Cueing for sequencing Toilet Transfer Details (indicate cue type and reason): STS at standard height commode. Toileting- Clothing Manipulation and Hygiene: Maximal assistance Toileting - Clothing Manipulation Details (indicate cue type and reason): pt with attempts for posterior peri care after effort of BM, decreased attention and need for follow up of OT for hygiene in standing. decreased cognition impacting significantly.     Functional mobility during ADLs: Minimal assistance;Rolling walker (2 wheels);Cueing for safety General ADL Comments: Increased assist for completion with all ADLs, at baselnie requiring assist so anticipate pt is near functional baseline for ADLs, but limited this date in tolerance and ability to participate d/t activity tolerance deficits.     Vision         Perception     Praxis      Pertinent Vitals/Pain Pain Assessment Pain Assessment: No/denies pain     Hand Dominance Right   Extremity/Trunk Assessment Upper Extremity Assessment Upper Extremity Assessment: Generalized weakness   Lower Extremity Assessment Lower Extremity Assessment: Defer to PT evaluation   Cervical / Trunk Assessment Cervical / Trunk Assessment: Normal   Communication Communication Communication: No difficulties   Cognition Arousal/Alertness: Awake/alert Behavior During Therapy: WFL for tasks assessed/performed Overall Cognitive Status: History of cognitive impairments - at baseline                                 General Comments: dementia baseline     General Comments       Exercises     Shoulder Instructions      Home Living Family/patient  expects to be discharged to:: Assisted living                             Home Equipment: Rolling Walker (2 wheels);Grab bars - toilet;BSC/3in1   Additional Comments: resides at ALF/SNF with sitter 2 days/a week for ADL's bathing as needed      Prior Functioning/Environment Prior Level of Function : Needs assist  Cognitive Assist : ADLs (cognitive)   ADLs (Cognitive): Step by step cues       Mobility Comments: pt's aides reports pt was using RW for mobility requiring occasional cues and at most CGA for transers at baseline ADLs Comments: A for all components of ADLs including bathing, dressing and medication from aide 2x/week or staff the remaining part of time.        OT Problem List: Impaired balance (sitting and/or standing);Decreased activity tolerance;Decreased strength;Decreased cognition;Decreased safety awareness      OT Treatment/Interventions: Self-care/ADL training;Therapeutic exercise;DME and/or AE instruction;Balance training;Therapeutic activities    OT Goals(Current goals can be found in the care plan section) Acute Rehab OT Goals Patient Stated Goal: none stated OT Goal Formulation: Patient unable to participate in goal setting Time For Goal Achievement: 12/24/21 Potential to Achieve Goals: Fair ADL Goals Pt Will Perform Grooming: standing;with supervision  Pt Will Perform Upper Body Bathing: with supervision;sitting Pt Will Transfer to Toilet: with supervision;ambulating Pt/caregiver will Perform Home Exercise Program: Both right and left upper extremity;With written HEP provided;With minimal assist  OT Frequency: Min 2X/week    Co-evaluation              AM-PAC OT "6 Clicks" Daily Activity     Outcome Measure Help from another person eating meals?: A Little Help from another person taking care of personal grooming?: A Lot Help from another person toileting, which includes using toliet, bedpan, or urinal?: Total Help from another person  bathing (including washing, rinsing, drying)?: A Lot Help from another person to put on and taking off regular upper body clothing?: A Lot Help from another person to put on and taking off regular lower body clothing?: A Lot 6 Click Score: 12   End of Session Equipment Utilized During Treatment: Rolling walker (2 wheels) Nurse Communication: Mobility status;Other (comment) (status of pt at end of session)  Activity Tolerance: Patient limited by fatigue Patient left: in chair;with nursing/sitter in room  OT Visit Diagnosis: Unsteadiness on feet (R26.81);Muscle weakness (generalized) (M62.81)                Time: 5277-8242 OT Time Calculation (min): 22 min Charges:  OT General Charges $OT Visit: 1 Visit OT Evaluation $OT Eval Moderate Complexity: 1 Mod  Odell Fasching OTR/L acute rehab services Office: [phone]  Joya Gaskins 12/10/2021, 10:51 AM

## 2021-12-10 NOTE — Progress Notes (Addendum)
Progress Note  Patient Name: Peter Singh Date of Encounter: 12/10/2021  [facility] Cardiologist: [name] III, MD   Subjective   No particular difficulty noted overnight.  Inpatient Medications    Scheduled Meds:  acetaminophen  500 mg Oral BID   aspirin  81 mg Oral Daily   atorvastatin  20 mg Oral QPM   cholecalciferol  2,000 Units Oral Daily   DULoxetine  20 mg Oral Daily   nitrofurantoin (macrocrystal-monohydrate)  100 mg Oral Q12H   pantoprazole  40 mg Oral Daily   polyethylene glycol  17 g Oral Daily   Continuous Infusions:  PRN Meds:    Vital Signs    Vitals:   12/09/21 2322 12/10/21 0139 12/10/21 0648 12/10/21 0702  BP: (!) 149/75 136/76 (!) 155/83 (!) 148/66  Pulse: 67 85 76   Resp: '20 20 17   '$ Temp: 98.6 F (37 C) 98.9 F (37.2 C) 98.2 F (36.8 C) 98.3 F (36.8 C)  TempSrc: Oral Oral Oral   SpO2: 94% 97% 100% 100%  Weight:      Height:        Intake/Output Summary (Last 24 hours) at 12/10/2021 1116 Last data filed at 12/10/2021 0900 Gross per 24 hour  Intake 1017 ml  Output 1150 ml  Net -133 ml      12/09/2021   11:40 AM 12/08/2021    8:00 PM 12/05/2021   11:44 AM  Last 3 Weights  Weight (lbs) 258 lb 2.5 oz 265 lb 262 lb  Weight (kg) 117.1 kg 120.203 kg 118.842 kg      Telemetry    Atrial fibs with controlled rate.- Personally Reviewed  ECG    Performed 12/09/2021 demonstrates Q waves II, III, and aVF as well as across the entire precordium.  Atrial fibs with controlled rate.  Interventricular conduction delay.- Personally Reviewed  Physical Exam  Large, no distress. GEN: No acute distress.   Neck: No JVD Cardiac: IIRR, no murmurs, rubs, or gallops.  Respiratory: Clear to auscultation bilaterally. GI: Soft, nontender, non-distended  MS: No edema; No deformity. Neuro:  Nonfocal  Psych: Normal affect   Labs    High Sensitivity Troponin:   Recent Labs  Lab 12/08/21 1904 12/08/21 2203 12/09/21 0015 12/09/21 0427   TROPONINIHS 86* 150* 212* 319*     Chemistry Recent Labs  Lab 12/08/21 1904 12/09/21 0427 12/10/21 0417  NA 136 138 138  K 4.2 4.1 4.1  CL 105 105 106  CO2 '23 25 23  '$ GLUCOSE 135* 109* 112*  BUN '18 18 19  '$ CREATININE 1.23 1.47* 1.40*  CALCIUM 9.7 9.7 9.9  PROT 6.4*  --   --   ALBUMIN 3.6  --   --   AST 14*  --   --   ALT 8  --   --   ALKPHOS 101  --   --   BILITOT 1.6*  --   --   GFRNONAA 57* 46* 49*  ANIONGAP '8 8 9    '$ Lipids No results for input(s): CHOL, TRIG, HDL, LABVLDL, LDLCALC, CHOLHDL in the last 168 hours.  Hematology Recent Labs  Lab 12/08/21 1904 12/09/21 0427  WBC 4.7 3.5*  RBC 4.65 4.35  HGB 13.1 12.3*  HCT 41.1 38.4*  MCV 88.4 88.3  MCH 28.2 28.3  MCHC 31.9 32.0  RDW 17.2* 17.2*  PLT 85* 85*   Thyroid No results for input(s): TSH, FREET4 in the last 168 hours.  BNP Recent Labs  Lab 12/08/21 1904  BNP 304.1*    DDimer No results for input(s): DDIMER in the last 168 hours.   Radiology    DG Chest Portable 1 View  Result Date: 12/08/2021 CLINICAL DATA:  resp. distress EXAM: PORTABLE CHEST 1 VIEW COMPARISON:  Radiographs October 07, 2016 FINDINGS: Evaluation is limited by patient positioning. The cardiomediastinal silhouette is unchanged and enlarged in contour.Central vascular congestion without overt edema. No pleural effusion. No pneumothorax. No acute pleuroparenchymal abnormality. Visualized abdomen is unremarkable. IMPRESSION: Cardiomegaly.  Central vascular congestion without overt edema. Electronically Signed   By: Valentino Saxon M.D.   On: 12/08/2021 19:54   ECHOCARDIOGRAM COMPLETE  Result Date: 12/09/2021    ECHOCARDIOGRAM REPORT   Patient Name:   Peter Singh Date of Exam: 12/09/2021 Medical Rec #:  [id number]        Height:       79.0 in Accession #:    [id number]       Weight:       265.0 lb Date of Birth:  [birth year]        BSA:          2.568 m Patient Age:    88 years         BP:           120/78 mmHg Patient Gender: M                 HR:           40 bpm. Exam Location:  Inpatient Procedure: 2D Echo, Color Doppler, Cardiac Doppler and Intracardiac            Opacification Agent Indications:    CHF-Acute Diastolic Z12.45  History:        Patient has prior history of Echocardiogram examinations, most                 recent 09/06/2014. PAD, Arrythmias:Atrial Fibrillation,                 Signs/Symptoms:Dementia; Risk Factors:Hypertension and                 Dyslipidemia. Chest pain and hypoxia.  Sonographer:    Darlina Sicilian RDCS Referring Phys: [id number] [place]  1. Left ventricular ejection fraction, by estimation, is 50 to 55%. The left ventricle has low normal function. The left ventricle has no regional wall motion abnormalities. There is severe concentric left ventricular hypertrophy. Left ventricular diastolic parameters are consistent with Grade III diastolic dysfunction (restrictive).  2. Right ventricular systolic function is mildly reduced. The right ventricular size is mildly enlarged. There is moderately elevated pulmonary artery systolic pressure.  3. Left atrial size was severely dilated.  4. The mitral valve is normal in structure. No evidence of mitral valve regurgitation. No evidence of mitral stenosis.  5. The aortic valve is normal in structure. Aortic valve regurgitation is not visualized. Mild aortic valve stenosis. Aortic valve area, by VTI measures 1.60 cm. Aortic valve mean gradient measures 13.5 mmHg. Aortic valve Vmax measures 2.38 m/s.  6. The inferior vena cava is normal in size with greater than 50% respiratory variability, suggesting right atrial pressure of 3 mmHg. FINDINGS  Left Ventricle: Left ventricular ejection fraction, by estimation, is 50 to 55%. The left ventricle has low normal function. The left ventricle has no regional wall motion abnormalities. Definity contrast agent was given IV to delineate the left ventricular endocardial borders. The left ventricular internal cavity size was normal  in  size. There is severe concentric left ventricular hypertrophy. Left ventricular diastolic parameters are consistent with Grade III diastolic dysfunction (restrictive). Right Ventricle: The right ventricular size is mildly enlarged. No increase in right ventricular wall thickness. Right ventricular systolic function is mildly reduced. There is moderately elevated pulmonary artery systolic pressure. The tricuspid regurgitant velocity is 2.78 m/s, and with an assumed right atrial pressure of 15 mmHg, the estimated right ventricular systolic pressure is 24.4 mmHg. Left Atrium: Left atrial size was severely dilated. Right Atrium: Right atrial size was normal in size. Pericardium: There is no evidence of pericardial effusion. Mitral Valve: The mitral valve is normal in structure. No evidence of mitral valve regurgitation. No evidence of mitral valve stenosis. Tricuspid Valve: The tricuspid valve is normal in structure. Tricuspid valve regurgitation is not demonstrated. No evidence of tricuspid stenosis. Aortic Valve: The aortic valve is normal in structure. Aortic valve regurgitation is not visualized. Mild aortic stenosis is present. Aortic valve mean gradient measures 13.5 mmHg. Aortic valve peak gradient measures 22.6 mmHg. Aortic valve area, by VTI measures 1.60 cm. Pulmonic Valve: The pulmonic valve was normal in structure. Pulmonic valve regurgitation is not visualized. No evidence of pulmonic stenosis. Aorta: The aortic root is normal in size and structure. Venous: The inferior vena cava is normal in size with greater than 50% respiratory variability, suggesting right atrial pressure of 3 mmHg. IAS/Shunts: No atrial level shunt detected by color flow Doppler.  LEFT VENTRICLE PLAX 2D LVIDd:         4.30 cm      Diastology LVIDs:         3.40 cm      LV e' medial:    5.29 cm/s LV PW:         2.10 cm      LV E/e' medial:  17.2 LV IVS:        2.50 cm      LV e' lateral:   6.56 cm/s LVOT diam:     2.60 cm      LV E/e'  lateral: 13.9 LV SV:         76 LV SV Index:   30 LVOT Area:     5.31 cm  LV Volumes (MOD) LV vol d, MOD A2C: 137.0 ml LV vol d, MOD A4C: 131.0 ml LV vol s, MOD A2C: 37.1 ml LV vol s, MOD A4C: 37.1 ml LV SV MOD A2C:     99.9 ml LV SV MOD A4C:     131.0 ml LV SV MOD BP:      98.1 ml RIGHT VENTRICLE RV S prime:     8.00 cm/s TAPSE (M-mode): 1.2 cm LEFT ATRIUM              Index        RIGHT ATRIUM           Index LA diam:        4.80 cm  1.87 cm/m   RA Area:     22.60 cm LA Vol (A2C):   118.0 ml 45.94 ml/m  RA Volume:   52.90 ml  20.60 ml/m LA Vol (A4C):   145.0 ml 56.45 ml/m LA Biplane Vol: 132.0 ml 51.39 ml/m  AORTIC VALVE AV Area (Vmax):    1.71 cm AV Area (Vmean):   1.40 cm AV Area (VTI):     1.60 cm AV Vmax:           237.50 cm/s AV Vmean:  170.500 cm/s AV VTI:            0.479 m AV Peak Grad:      22.6 mmHg AV Mean Grad:      13.5 mmHg LVOT Vmax:         76.40 cm/s LVOT Vmean:        45.100 cm/s LVOT VTI:          0.144 m LVOT/AV VTI ratio: 0.30  AORTA Ao Root diam: 4.00 cm Ao Asc diam:  3.70 cm MITRAL VALVE               TRICUSPID VALVE MV Area (PHT): 4.11 cm    TR Peak grad:   30.9 mmHg MV Decel Time: 185 msec    TR Vmax:        278.00 cm/s MV E velocity: 91.10 cm/s                            SHUNTS                            Systemic VTI:  0.14 m                            Systemic Diam: 2.60 cm Kardie Tobb DO Electronically signed by Berniece Salines DO Signature Date/Time: 12/09/2021/5:40:25 PM    Final     Cardiac Studies   Summary of data: Echo 12/09/2021 demonstrates preserved systolic function with grade 3 diastolic dysfunction and severe concentric hypertrophy. BNP is mildly elevated Chest x-ray did not reveal evidence of failure.  Patient Profile     88 y.o. male dementia, suprapubic catheter, recurrent UTI, BPH, renal cancer, CKD III, HFpEF, HLD, HTN, and OA who is being seen today for the evaluation of elevated hsT and chest pain at the request of Dr. Ileene Musa (hospitalist).   Diastolic heart failure.  Assessment & Plan    Acute on chronic diastolic heart failure: Diagnosed based upon elevated BNP and severe diastolic dysfunction on recent echo.  He also has severe concentric left ventricular hypertrophy.  The guideline directed therapy would be SGLT2 therapy, however this is not a reasonable treatment option given bacteriuria and increased likelihood of worsening urinary tract infections.  Low-dose spironolactone would be a consideration if he has recurrence of acute dyspnea. Abnormal EKG: Tracing has chronically shown evidence of old anterior and inferior infarction. Elevated troponin I: Persistent elevation but no evolutionary EKG changes.  Likely related to diastolic heart failure.  No specific additions to therapy.  Recommend symptomatic treatment.  No ischemic evaluation.  The findings of the echocardiogram were discussed with the patient and wife.  Management strategies were also reviewed.  [facility] will sign off.   Medication Recommendations: None Other recommendations (labs, testing, etc): None Follow up as an outpatient: As needed  For questions or updates, please contact [facility] Please consult [email or web address] for contact info under        Signed, Sinclair Grooms, MD  12/10/2021, 11:16 AM

## 2021-12-10 NOTE — NC FL2 (Signed)
[place] LEVEL OF CARE SCREENING TOOL     IDENTIFICATION  Patient Name: Peter Singh Birthdate: September 14, 1931 Sex: male Admission Date (Current Location): 12/08/2021  [facility] and [place] Number:  Herbalist and Address:  [facility] [place] [facility], [place] [address] [place], [place] [address]      Provider Number: [id number]  Attending Physician Name and Address:  Patrecia Pour, MD  Relative Name and Phone Number:  Sandberg,[name] (Spouse)   [phone] (Home Phone)    Current Level of Care: Hospital Recommended Level of Care: [place] Prior Approval Number:    Date Approved/Denied:   PASRR Number:    Discharge Plan: SNF    Current Diagnoses: Patient Active Problem List   Diagnosis Date Noted   NSTEMI (non-ST elevated myocardial infarction) ([place]) 12/09/2021   Acute respiratory failure with hypoxia ([place]) 12/08/2021   UTI (urinary tract infection) 12/08/2021   Cyanosis of fingertip 11/28/2021   Slow transit constipation 04/20/2020   MRSA (methicillin resistant staph aureus) culture positive 11/17/2019   Suprapubic catheter ([place]) 10/30/2019   Peripheral vascular disease ([place]) 09/02/2019   Knee pain 06/24/2019   Vascular dementia without behavioral disturbance ([place]) 05/07/2019   Other neutropenia ([place]) 11/16/2018   Chronic diastolic heart failure ([place]) 06/08/2018   Encounter for therapeutic drug monitoring 03/19/2017   Urinary retention due to benign prostatic hyperplasia 10/14/2016   Renal mass, right 10/14/2016   Dementia without behavioral disturbance ([place]) 10/14/2016   Thrombocytopenia ([place]) 10/07/2016   Hereditary and idiopathic peripheral neuropathy 07/06/2014   Essential hypertension 03/24/2014   Hyperlipidemia 03/24/2014   Chronic anticoagulation 03/24/2014   Atrial fibrillation ([place]) 05/17/2013    Orientation RESPIRATION BLADDER Height & Weight     Self  O2 (intermittent o2 2LNC) Indwelling catheter  (Suprapubic catheter) Weight: 258 lb 2.5 oz (117.1 kg) Height:  '6\' 7"'$  (200.7 cm)  BEHAVIORAL SYMPTOMS/MOOD NEUROLOGICAL BOWEL NUTRITION STATUS      Continent Diet (see d/c summary)  AMBULATORY STATUS COMMUNICATION OF NEEDS Skin   Extensive Assist Verbally Normal                       Personal Care Assistance Level of Assistance  Bathing, Feeding, Dressing Bathing Assistance: Limited assistance Feeding assistance: Independent Dressing Assistance: Limited assistance     Functional Limitations Info  Sight, Hearing, Speech Sight Info: Impaired Hearing Info: Adequate Speech Info: Adequate    SPECIAL CARE FACTORS FREQUENCY  PT (By licensed PT), OT (By licensed OT)     PT Frequency: 5x/week OT Frequency: 5x/week            Contractures Contractures Info: Not present    Additional Factors Info  Code Status, Allergies Code Status Info: DNR Allergies Info: penicillins           Current Medications (12/10/2021):  This is the current hospital active medication list Current Facility-Administered Medications  Medication Dose Route Frequency Provider Last Rate Last Admin   acetaminophen (TYLENOL) tablet 500 mg  500 mg Oral BID Tu, Ching T, DO   500 mg at 12/10/21 9211   aspirin chewable tablet 81 mg  81 mg Oral Daily Dion Body, MD   81 mg at 12/10/21 9417   atorvastatin (LIPITOR) tablet 20 mg  20 mg Oral QPM Tu, Ching T, DO   20 mg at 12/09/21 1725   cholecalciferol (VITAMIN D3) tablet 2,000 Units  2,000 Units Oral Daily Tu, Ching T, DO  2,000 Units at 12/10/21 0937   DULoxetine (CYMBALTA) DR capsule 20 mg  20 mg Oral Daily Tu, Ching T, DO   20 mg at 12/10/21 8270   nitrofurantoin (macrocrystal-monohydrate) (MACROBID) capsule 100 mg  100 mg Oral Q12H Tu, Ching T, DO   100 mg at 12/10/21 0937   pantoprazole (PROTONIX) EC tablet 40 mg  40 mg Oral Daily Dion Body, MD   40 mg at 12/10/21 7867   polyethylene glycol (MIRALAX / GLYCOLAX) packet 17 g  17 g Oral  Daily Tu, Ching T, DO   17 g at 12/10/21 5449   warfarin (COUMADIN) tablet 6 mg  6 mg Oral ONCE-1600 Einar Grad, [facility]       Warfarin - Pharmacist Dosing Inpatient   Does not apply q1600 Einar Grad, [facility]         Discharge Medications: Please see discharge summary for a list of discharge medications.  Relevant Imaging Results:  Relevant Lab Results:   Additional Information SS # [id number]  [name], LCSW

## 2021-12-10 NOTE — TOC Initial Note (Addendum)
Transition of Care [facility]) - Initial/Assessment Note    Patient Details  Name: Peter Singh MRN: [id number] Date of Birth: 1931-11-03  Transition of [facility]) CM/SW Contact:    [name], [place] Phone Number: 12/10/2021, 12:54 PM  Clinical Narrative:                  CSW informed that pt is from [facility]. DC orders have been placed. PT/OT evals still pending. CSW called and left message with [facility] requesting call back regarding pt's DC.   1320: CSW received call from [employer] with [facility]. CSW is informed that pt is from their SNF facility LTC and can return today. Pt would need PTAR transport. CSW will fax fl2, therapy recs, and DC summary when available. Nurse will need to call report to [phone], Skilled [place] 2 Room 142.    Barriers to Discharge:  (PT eval pending. Will need to update ALF on diagnosis/recommendations)   Patient Goals and CMS Choice        Expected Discharge Plan and Services         Living arrangements for the past 2 months: [facility] Expected Discharge Date: 12/10/21                                    Prior Living Arrangements/Services Living arrangements for the past 2 months: [facility] Lives with:: Facility Resident          Need for Family Participation in Patient Care: Yes (Comment) Care giver support system in place?: Yes (comment)   Criminal Activity/Legal Involvement Pertinent to Current Situation/Hospitalization: No - Comment as needed  Activities of Daily Living Home Assistive Devices/Equipment: Walker (specify type) ADL Screening (condition at time of admission) Patient's cognitive ability adequate to safely complete daily activities?: Yes Is the patient deaf or have difficulty hearing?: No Does the patient have difficulty seeing, even when wearing glasses/contacts?: No Does the patient have difficulty concentrating, remembering, or making decisions?: Yes Patient able to  express need for assistance with ADLs?: Yes Does the patient have difficulty dressing or bathing?: No Independently performs ADLs?: No Does the patient have difficulty walking or climbing stairs?: Yes Weakness of Legs: Both Weakness of Arms/Hands: Both  Permission Sought/Granted                  Emotional Assessment              Admission diagnosis:  Acute respiratory failure with hypoxia ([place]) [J96.01] Acute on chronic congestive heart failure, unspecified heart failure type ([place]) [I50.9] NSTEMI (non-ST elevated myocardial infarction) [facility]) [I21.4] Patient Active Problem List   Diagnosis Date Noted   NSTEMI (non-ST elevated myocardial infarction) ([place]) 12/09/2021   Acute respiratory failure with hypoxia ([place]) 12/08/2021   UTI (urinary tract infection) 12/08/2021   Cyanosis of fingertip 11/28/2021   Slow transit constipation 04/20/2020   MRSA (methicillin resistant staph aureus) culture positive 11/17/2019   Suprapubic catheter ([place]) 10/30/2019   Peripheral vascular disease ([place]) 09/02/2019   Knee pain 06/24/2019   Vascular dementia without behavioral disturbance ([place]) 05/07/2019   Other neutropenia ([place]) 11/16/2018   Chronic diastolic heart failure ([place]) 06/08/2018   Encounter for therapeutic drug monitoring 03/19/2017   Urinary retention due to benign prostatic hyperplasia 10/14/2016   Renal mass, right 10/14/2016   Dementia without behavioral disturbance ([place]) 10/14/2016   Thrombocytopenia ([place]) 10/07/2016   Hereditary and  idiopathic peripheral neuropathy 07/06/2014   Essential hypertension 03/24/2014   Hyperlipidemia 03/24/2014   Chronic anticoagulation 03/24/2014   Atrial fibrillation ([place]) 05/17/2013   PCP:  [name], MD Pharmacy:   [facility], [place] [address] Phone: [phone] Fax: [phone]     Social Determinants of Health (SDOH)  Interventions    Readmission Risk Interventions     View : No data to display.

## 2021-12-10 NOTE — Discharge Summary (Addendum)
Physician Discharge Summary   Patient: Peter Singh MRN: [id number] DOB: August 11, 1931  Admit date:     12/08/2021  Discharge date: 12/10/21  Discharge Physician: Patrecia Pour   PCP: [name], MD   Recommendations at discharge:  Consider addition of spironolactone given HFpEF if BPs rise and/or dyspnea recurs.   Discharge Diagnoses: Principal Problem:   Acute respiratory failure with hypoxia ([place]) Active Problems:   Atrial fibrillation (HCC)   Essential hypertension   Thrombocytopenia (HCC)   Urinary retention due to benign prostatic hyperplasia   Dementia without behavioral disturbance (HCC)   UTI (urinary tract infection)   NSTEMI (non-ST elevated myocardial infarction) [facility])  Hospital Course: Peter Singh is an 88 y.o. male with a history of dementia, chronic AFib on coumadin, chronic HFpEF, HTN, HLD, PVD, chronic urinary retention with suprapubic catheter who presented from [facility] with chest pains and labored respirations found to have marginal hypoxemia with respiratory distress. Troponin was elevated concerning for NSTEMI with abnormal ECG (shows evidence of old anterior and inferior infarction not appreciably changed per cardiology). IV lasix was given with improvement in respiratory status. Echocardiogram revealed severe diastolic dysfunction, severe concentric LVH without regional wall motion abnormalities. Cardiology was consulted, diagnosed type 2 NSTEMI/demand myocardial ischemia due to mild acute on chronic CHF with respiratory distress. No further investigations or changes in home medications were recommended.   With improved respiratory status, resolved chest pain, he is stable to discharge back to a familiar environment.   Assessment and Plan: Acute hypoxic respiratory failure: Desaturating to 90% on room air with tachypnea and dyspnea at time of presentation likely to mild acute on chronic diastolic heart failure exacerbation - Resolved with diuresis.  Continue home diuretic regimen, consider addition of spironolactone.   NSTEMI, type 2, demand myocardial ischemia: Suspect LVH with acute volume overload and respiratory distress caused coronary demand/supply mismatch. No evolutionary changes, chest pain has resolved. No further intervention is recommended by cardiology at this time.  Presented with troponin of 80 which trended up to 200 -care per Cardiology with plan to manage conservatively at this time   Chronic atrial fibrillation on Coumadin: Rate controlled without medications.  - Continue coumadin.    Increased creatinine, stage II CKD. Exact diagnosis is uncertain. Slight bump in creatinine with IV diuretic which is starting to improve. CrCl baseline appears to be >19m/min, and is currently ~575mmin. Not technically diagnostic of AKI.  - Suggest recheck at follow up and avoiding nephrotoxins.   UTI: Found to have grossly positive UA at facility on 6/1 and started on c[facility]odoxime and [facility] -continuing therapy but no active symptoms of UTI presently   Dementia without behavioral disturbance: Appears to be at his baseline at the time of my exam today. Of note, was unable to reach wife by phone at either listed phone number.   Urinary retention due to benign prostatic hyperplasia: With chronic indwelling suprapubic catheter   Thrombocytopenia Chronic thrombocytopenia, no bleeding   Essential hypertension Blood pressure controlled at present  Consultants: Cardiology Procedures performed: None  Disposition: Skilled nursing facility Diet recommendation:  Cardiac diet DISCHARGE MEDICATION: Allergies as of 12/10/2021       Reactions   Penicillins Rash   Has patient had a PCN reaction causing immediate rash, facial/tongue/throat swelling, SOB or lightheadedness with hypotension: unknown Has patient had a PCN reaction causing severe rash involving mucus membranes or skin necrosis: unknown Has patient had a PCN  reaction that required hospitalization :  unknown Has patient had a PCN reaction occurring within the last 10 years: no If all of the above answers are "NO", then may proceed with Cephalosporin use.        Medication List     TAKE these medications    acetaminophen 500 MG tablet Commonly known as: TYLENOL Take 500 mg by mouth 2 (two) times daily. Scheduled and every 4 hours as needed (ending 05/05/2019)Document area of discomfort and effectiveness of medication.   atorvastatin 20 MG tablet Commonly known as: LIPITOR Take 20 mg by mouth every evening.   cholecalciferol 25 MCG (1000 UNIT) tablet Commonly known as: VITAMIN D3 Take 2,000 Units by mouth daily.   DULoxetine 20 MG capsule Commonly known as: CYMBALTA Take 20 mg by mouth daily.   furosemide 20 MG tablet Commonly known as: LASIX Take 20 mg by mouth. Once A Day on Tue, Thu, Sat   methenamine 1 g tablet Commonly known as: MANDELAMINE Take 1,000 mg by mouth 2 (two) times daily.   mupirocin ointment 2 % Commonly known as: BACTROBAN 1 application. 2 (two) times daily.   nitrofurantoin (macrocrystal-monohydrate) 100 MG capsule Commonly known as: MACROBID Take 100 mg by mouth 2 (two) times daily.   polyethylene glycol 17 g packet Commonly known as: MIRALAX / GLYCOLAX Take 17 g by mouth daily.   potassium chloride 10 MEQ CR capsule Commonly known as: MICRO-K Take 10 mEq by mouth. Once A Day on Tue, Thu, Sat   triamcinolone lotion 0.1 % Commonly known as: KENALOG Apply 1 application topically 2 (two) times daily as needed.   warfarin 4 MG tablet Commonly known as: COUMADIN Take 4 mg by mouth. Once A Day on Mon, Wed, Fri   warfarin 5 MG tablet Commonly known as: COUMADIN Take 5 mg by mouth. Once A Day on Sun, Tue, Thu, Sat        Follow-up Information     [name], MD Follow up.   Specialty: Internal Medicine Contact information: [place] [address] [phone]          [name], MD .   Specialty: Cardiology Contact information: [phone] [address] [place] [address] [phone]                Discharge Exam: Danley Danker Weights   12/08/21 2000 12/09/21 1140  Weight: 120.2 kg 117.1 kg  Elderly pleasantly confused male in no distress Clear, nonlabored Irreg irreg without significant edema Diffusely weak without focal deficits  Condition at discharge: stable  The results of significant diagnostics from this hospitalization (including imaging, microbiology, ancillary and laboratory) are listed below for reference.   Imaging Studies: DG Chest Portable 1 View  Result Date: 12/08/2021 CLINICAL DATA:  resp. distress EXAM: PORTABLE CHEST 1 VIEW COMPARISON:  Radiographs October 07, 2016 FINDINGS: Evaluation is limited by patient positioning. The cardiomediastinal silhouette is unchanged and enlarged in contour.Central vascular congestion without overt edema. No pleural effusion. No pneumothorax. No acute pleuroparenchymal abnormality. Visualized abdomen is unremarkable. IMPRESSION: Cardiomegaly.  Central vascular congestion without overt edema. Electronically Signed   By: Valentino Saxon M.D.   On: 12/08/2021 19:54   ECHOCARDIOGRAM COMPLETE  Result Date: 12/09/2021    ECHOCARDIOGRAM REPORT   Patient Name:   Peter Singh Date of Exam: 12/09/2021 Medical Rec #:  [id number]        Height:       79.0 in Accession #:    [id number]  Weight:       265.0 lb Date of Birth:  1931/09/22        BSA:          2.568 m Patient Age:    77 years         BP:           120/78 mmHg Patient Gender: M                HR:           40 bpm. Exam Location:  Inpatient Procedure: 2D Echo, Color Doppler, Cardiac Doppler and Intracardiac            Opacification Agent Indications:    CHF-Acute Diastolic L97.67  History:        Patient has prior history of Echocardiogram examinations, most                 recent 09/06/2014. PAD, Arrythmias:Atrial Fibrillation,                  Signs/Symptoms:Dementia; Risk Factors:Hypertension and                 Dyslipidemia. Chest pain and hypoxia.  Sonographer:    Darlina Sicilian RDCS Referring Phys: [id number] [place]  1. Left ventricular ejection fraction, by estimation, is 50 to 55%. The left ventricle has low normal function. The left ventricle has no regional wall motion abnormalities. There is severe concentric left ventricular hypertrophy. Left ventricular diastolic parameters are consistent with Grade III diastolic dysfunction (restrictive).  2. Right ventricular systolic function is mildly reduced. The right ventricular size is mildly enlarged. There is moderately elevated pulmonary artery systolic pressure.  3. Left atrial size was severely dilated.  4. The mitral valve is normal in structure. No evidence of mitral valve regurgitation. No evidence of mitral stenosis.  5. The aortic valve is normal in structure. Aortic valve regurgitation is not visualized. Mild aortic valve stenosis. Aortic valve area, by VTI measures 1.60 cm. Aortic valve mean gradient measures 13.5 mmHg. Aortic valve Vmax measures 2.38 m/s.  6. The inferior vena cava is normal in size with greater than 50% respiratory variability, suggesting right atrial pressure of 3 mmHg. FINDINGS  Left Ventricle: Left ventricular ejection fraction, by estimation, is 50 to 55%. The left ventricle has low normal function. The left ventricle has no regional wall motion abnormalities. Definity contrast agent was given IV to delineate the left ventricular endocardial borders. The left ventricular internal cavity size was normal in size. There is severe concentric left ventricular hypertrophy. Left ventricular diastolic parameters are consistent with Grade III diastolic dysfunction (restrictive). Right Ventricle: The right ventricular size is mildly enlarged. No increase in right ventricular wall thickness. Right ventricular systolic function is mildly reduced. There is  moderately elevated pulmonary artery systolic pressure. The tricuspid regurgitant velocity is 2.78 m/s, and with an assumed right atrial pressure of 15 mmHg, the estimated right ventricular systolic pressure is 02.4 mmHg. Left Atrium: Left atrial size was severely dilated. Right Atrium: Right atrial size was normal in size. Pericardium: There is no evidence of pericardial effusion. Mitral Valve: The mitral valve is normal in structure. No evidence of mitral valve regurgitation. No evidence of mitral valve stenosis. Tricuspid Valve: The tricuspid valve is normal in structure. Tricuspid valve regurgitation is not demonstrated. No evidence of tricuspid stenosis. Aortic Valve: The aortic valve is normal in structure. Aortic valve regurgitation is not visualized. Mild aortic stenosis is present. Aortic valve  mean gradient measures 13.5 mmHg. Aortic valve peak gradient measures 22.6 mmHg. Aortic valve area, by VTI measures 1.60 cm. Pulmonic Valve: The pulmonic valve was normal in structure. Pulmonic valve regurgitation is not visualized. No evidence of pulmonic stenosis. Aorta: The aortic root is normal in size and structure. Venous: The inferior vena cava is normal in size with greater than 50% respiratory variability, suggesting right atrial pressure of 3 mmHg. IAS/Shunts: No atrial level shunt detected by color flow Doppler.  LEFT VENTRICLE PLAX 2D LVIDd:         4.30 cm      Diastology LVIDs:         3.40 cm      LV e' medial:    5.29 cm/s LV PW:         2.10 cm      LV E/e' medial:  17.2 LV IVS:        2.50 cm      LV e' lateral:   6.56 cm/s LVOT diam:     2.60 cm      LV E/e' lateral: 13.9 LV SV:         76 LV SV Index:   30 LVOT Area:     5.31 cm  LV Volumes (MOD) LV vol d, MOD A2C: 137.0 ml LV vol d, MOD A4C: 131.0 ml LV vol s, MOD A2C: 37.1 ml LV vol s, MOD A4C: 37.1 ml LV SV MOD A2C:     99.9 ml LV SV MOD A4C:     131.0 ml LV SV MOD BP:      98.1 ml RIGHT VENTRICLE RV S prime:     8.00 cm/s TAPSE (M-mode): 1.2  cm LEFT ATRIUM              Index        RIGHT ATRIUM           Index LA diam:        4.80 cm  1.87 cm/m   RA Area:     22.60 cm LA Vol (A2C):   118.0 ml 45.94 ml/m  RA Volume:   52.90 ml  20.60 ml/m LA Vol (A4C):   145.0 ml 56.45 ml/m LA Biplane Vol: 132.0 ml 51.39 ml/m  AORTIC VALVE AV Area (Vmax):    1.71 cm AV Area (Vmean):   1.40 cm AV Area (VTI):     1.60 cm AV Vmax:           237.50 cm/s AV Vmean:          170.500 cm/s AV VTI:            0.479 m AV Peak Grad:      22.6 mmHg AV Mean Grad:      13.5 mmHg LVOT Vmax:         76.40 cm/s LVOT Vmean:        45.100 cm/s LVOT VTI:          0.144 m LVOT/AV VTI ratio: 0.30  AORTA Ao Root diam: 4.00 cm Ao Asc diam:  3.70 cm MITRAL VALVE               TRICUSPID VALVE MV Area (PHT): 4.11 cm    TR Peak grad:   30.9 mmHg MV Decel Time: 185 msec    TR Vmax:        278.00 cm/s MV E velocity: 91.10 cm/s  SHUNTS                            Systemic VTI:  0.14 m                            Systemic Diam: 2.60 cm Godfrey Pick Tobb DO Electronically signed by Berniece Salines DO Signature Date/Time: 12/09/2021/5:40:25 PM    Final     Microbiology: Results for orders placed or performed during the hospital encounter of 12/08/21  SARS Coronavirus 2 by RT PCR (hospital order, performed in [facility] hospital lab) *cepheid single result test* Anterior Nasal Swab     Status: None   Collection Time: 12/08/21  7:04 PM   Specimen: Anterior Nasal Swab  Result Value Ref Range Status   SARS Coronavirus 2 by RT PCR NEGATIVE NEGATIVE Final    Comment: (NOTE) SARS-CoV-2 target nucleic acids are NOT DETECTED.  The SARS-CoV-2 RNA is generally detectable in upper and lower respiratory specimens during the acute phase of infection. The lowest concentration of SARS-CoV-2 viral copies this assay can detect is 250 copies / mL. A negative result does not preclude SARS-CoV-2 infection and should not be used as the sole basis for treatment or other patient management  decisions.  A negative result may occur with improper specimen collection / handling, submission of specimen other than nasopharyngeal swab, presence of viral mutation(s) within the areas targeted by this assay, and inadequate number of viral copies (<250 copies / mL). A negative result must be combined with clinical observations, patient history, and epidemiological information.  Fact Sheet for Patients:   https://www.patel.info/  Fact Sheet for Healthcare Providers: https://hall.com/  This test is not yet approved or  cleared by the [place] FDA and has been authorized for detection and/or diagnosis of SARS-CoV-2 by FDA under an Emergency Use Authorization (EUA).  This EUA will remain in effect (meaning this test can be used) for the duration of the COVID-19 declaration under Section 564(b)(1) of the Act, 21 U.S.C. section 360bbb-3(b)(1), unless the authorization is terminated or revoked sooner.  Performed at [facility], [place] [address], [place], [place] [address]     Labs: CBC: Recent Labs  Lab 12/08/21 1904 12/09/21 0427  WBC 4.7 3.5*  NEUTROABS 3.8  --   HGB 13.1 12.3*  HCT 41.1 38.4*  MCV 88.4 88.3  PLT 85* 85*   Basic Metabolic Panel: Recent Labs  Lab 12/08/21 1904 12/09/21 0427 12/10/21 0417  NA 136 138 138  K 4.2 4.1 4.1  CL 105 105 106  CO2 '23 25 23  '$ GLUCOSE 135* 109* 112*  BUN '18 18 19  '$ CREATININE 1.23 1.47* 1.40*  CALCIUM 9.7 9.7 9.9   Liver Function Tests: Recent Labs  Lab 12/08/21 1904  AST 14*  ALT 8  ALKPHOS 101  BILITOT 1.6*  PROT 6.4*  ALBUMIN 3.6     Discharge time spent: greater than 30 minutes.  Signed: Patrecia Pour, MD [facility] 12/10/2021

## 2021-12-11 ENCOUNTER — Encounter (HOSPITAL_COMMUNITY): Payer: Self-pay | Admitting: Emergency Medicine

## 2021-12-11 ENCOUNTER — Other Ambulatory Visit: Payer: Self-pay

## 2021-12-11 ENCOUNTER — Telehealth: Payer: Self-pay

## 2021-12-11 ENCOUNTER — Inpatient Hospital Stay (HOSPITAL_COMMUNITY)
Admission: EM | Admit: 2021-12-11 | Discharge: 2021-12-16 | DRG: 291 | Disposition: A | Discharge status: Skilled Nursing Facility | Payer: Medicare Other | Source: Skilled Nursing Facility | Attending: Internal Medicine | Admitting: Internal Medicine

## 2021-12-11 ENCOUNTER — Emergency Department (HOSPITAL_COMMUNITY): Payer: Medicare Other

## 2021-12-11 DIAGNOSIS — I110 Hypertensive heart disease with heart failure: Secondary | ICD-10-CM | POA: Diagnosis not present

## 2021-12-11 DIAGNOSIS — Z79899 Other long term (current) drug therapy: Secondary | ICD-10-CM

## 2021-12-11 DIAGNOSIS — I509 Heart failure, unspecified: Secondary | ICD-10-CM

## 2021-12-11 DIAGNOSIS — J9601 Acute respiratory failure with hypoxia: Secondary | ICD-10-CM | POA: Diagnosis present

## 2021-12-11 DIAGNOSIS — R0602 Shortness of breath: Secondary | ICD-10-CM | POA: Diagnosis not present

## 2021-12-11 DIAGNOSIS — I130 Hypertensive heart and chronic kidney disease with heart failure and stage 1 through stage 4 chronic kidney disease, or unspecified chronic kidney disease: Secondary | ICD-10-CM | POA: Diagnosis present

## 2021-12-11 DIAGNOSIS — I739 Peripheral vascular disease, unspecified: Secondary | ICD-10-CM | POA: Diagnosis present

## 2021-12-11 DIAGNOSIS — I482 Chronic atrial fibrillation, unspecified: Secondary | ICD-10-CM | POA: Diagnosis present

## 2021-12-11 DIAGNOSIS — Z20822 Contact with and (suspected) exposure to covid-19: Secondary | ICD-10-CM | POA: Diagnosis present

## 2021-12-11 DIAGNOSIS — Z880 Allergy status to penicillin: Secondary | ICD-10-CM

## 2021-12-11 DIAGNOSIS — Z7901 Long term (current) use of anticoagulants: Secondary | ICD-10-CM | POA: Diagnosis not present

## 2021-12-11 DIAGNOSIS — N182 Chronic kidney disease, stage 2 (mild): Secondary | ICD-10-CM | POA: Diagnosis present

## 2021-12-11 DIAGNOSIS — F0390 Unspecified dementia without behavioral disturbance: Secondary | ICD-10-CM | POA: Diagnosis present

## 2021-12-11 DIAGNOSIS — I10 Essential (primary) hypertension: Secondary | ICD-10-CM | POA: Diagnosis present

## 2021-12-11 DIAGNOSIS — R509 Fever, unspecified: Secondary | ICD-10-CM | POA: Diagnosis not present

## 2021-12-11 DIAGNOSIS — Z823 Family history of stroke: Secondary | ICD-10-CM | POA: Diagnosis not present

## 2021-12-11 DIAGNOSIS — K5909 Other constipation: Secondary | ICD-10-CM | POA: Diagnosis present

## 2021-12-11 DIAGNOSIS — E785 Hyperlipidemia, unspecified: Secondary | ICD-10-CM | POA: Diagnosis present

## 2021-12-11 DIAGNOSIS — R69 Illness, unspecified: Secondary | ICD-10-CM

## 2021-12-11 DIAGNOSIS — I248 Other forms of acute ischemic heart disease: Secondary | ICD-10-CM | POA: Diagnosis present

## 2021-12-11 DIAGNOSIS — I252 Old myocardial infarction: Secondary | ICD-10-CM | POA: Diagnosis not present

## 2021-12-11 DIAGNOSIS — Z9359 Other cystostomy status: Secondary | ICD-10-CM

## 2021-12-11 DIAGNOSIS — D696 Thrombocytopenia, unspecified: Secondary | ICD-10-CM | POA: Diagnosis present

## 2021-12-11 DIAGNOSIS — J309 Allergic rhinitis, unspecified: Secondary | ICD-10-CM | POA: Diagnosis present

## 2021-12-11 DIAGNOSIS — R230 Cyanosis: Secondary | ICD-10-CM | POA: Diagnosis not present

## 2021-12-11 DIAGNOSIS — Z8673 Personal history of transient ischemic attack (TIA), and cerebral infarction without residual deficits: Secondary | ICD-10-CM

## 2021-12-11 DIAGNOSIS — I5033 Acute on chronic diastolic (congestive) heart failure: Secondary | ICD-10-CM | POA: Diagnosis present

## 2021-12-11 DIAGNOSIS — N401 Enlarged prostate with lower urinary tract symptoms: Secondary | ICD-10-CM | POA: Diagnosis present

## 2021-12-11 DIAGNOSIS — Z66 Do not resuscitate: Secondary | ICD-10-CM | POA: Diagnosis present

## 2021-12-11 DIAGNOSIS — R338 Other retention of urine: Secondary | ICD-10-CM | POA: Diagnosis present

## 2021-12-11 DIAGNOSIS — N179 Acute kidney failure, unspecified: Secondary | ICD-10-CM | POA: Diagnosis present

## 2021-12-11 DIAGNOSIS — I517 Cardiomegaly: Secondary | ICD-10-CM | POA: Diagnosis not present

## 2021-12-11 DIAGNOSIS — Z85528 Personal history of other malignant neoplasm of kidney: Secondary | ICD-10-CM

## 2021-12-11 DIAGNOSIS — R4182 Altered mental status, unspecified: Secondary | ICD-10-CM | POA: Diagnosis not present

## 2021-12-11 DIAGNOSIS — I4891 Unspecified atrial fibrillation: Secondary | ICD-10-CM | POA: Diagnosis present

## 2021-12-11 DIAGNOSIS — I272 Pulmonary hypertension, unspecified: Secondary | ICD-10-CM | POA: Diagnosis present

## 2021-12-11 DIAGNOSIS — I499 Cardiac arrhythmia, unspecified: Secondary | ICD-10-CM | POA: Diagnosis not present

## 2021-12-11 DIAGNOSIS — I5032 Chronic diastolic (congestive) heart failure: Secondary | ICD-10-CM | POA: Diagnosis present

## 2021-12-11 DIAGNOSIS — I700 Atherosclerosis of aorta: Secondary | ICD-10-CM | POA: Diagnosis not present

## 2021-12-11 DIAGNOSIS — Z7401 Bed confinement status: Secondary | ICD-10-CM | POA: Diagnosis not present

## 2021-12-11 DIAGNOSIS — R0902 Hypoxemia: Secondary | ICD-10-CM | POA: Diagnosis not present

## 2021-12-11 DIAGNOSIS — Z87891 Personal history of nicotine dependence: Secondary | ICD-10-CM

## 2021-12-11 DIAGNOSIS — R000 Tachycardia, unspecified: Secondary | ICD-10-CM | POA: Diagnosis not present

## 2021-12-11 LAB — CBC WITH DIFFERENTIAL/PLATELET
Abs Immature Granulocytes: 0.16 10*3/uL — ABNORMAL HIGH (ref 0.00–0.07)
Basophils Absolute: 0 10*3/uL (ref 0.0–0.1)
Basophils Relative: 0 %
Eosinophils Absolute: 0 10*3/uL (ref 0.0–0.5)
Eosinophils Relative: 0 %
HCT: 43.1 % (ref 39.0–52.0)
Hemoglobin: 13.2 g/dL (ref 13.0–17.0)
Immature Granulocytes: 3 %
Lymphocytes Relative: 2 %
Lymphs Abs: 0.1 10*3/uL — ABNORMAL LOW (ref 0.7–4.0)
MCH: 27.7 pg (ref 26.0–34.0)
MCHC: 30.6 g/dL (ref 30.0–36.0)
MCV: 90.5 fL (ref 80.0–100.0)
Monocytes Absolute: 0.7 10*3/uL (ref 0.1–1.0)
Monocytes Relative: 14 %
Neutro Abs: 4.1 10*3/uL (ref 1.7–7.7)
Neutrophils Relative %: 81 %
Platelets: 88 10*3/uL — ABNORMAL LOW (ref 150–400)
RBC: 4.76 MIL/uL (ref 4.22–5.81)
RDW: 17.5 % — ABNORMAL HIGH (ref 11.5–15.5)
WBC: 5.1 10*3/uL (ref 4.0–10.5)
nRBC: 0 % (ref 0.0–0.2)

## 2021-12-11 LAB — COMPREHENSIVE METABOLIC PANEL
ALT: 8 U/L (ref 0–44)
AST: 16 U/L (ref 15–41)
Albumin: 3.4 g/dL — ABNORMAL LOW (ref 3.5–5.0)
Alkaline Phosphatase: 82 U/L (ref 38–126)
Anion gap: 12 (ref 5–15)
BUN: 36 mg/dL — ABNORMAL HIGH (ref 8–23)
CO2: 22 mmol/L (ref 22–32)
Calcium: 9.7 mg/dL (ref 8.9–10.3)
Chloride: 103 mmol/L (ref 98–111)
Creatinine, Ser: 1.65 mg/dL — ABNORMAL HIGH (ref 0.61–1.24)
GFR, Estimated: 40 mL/min — ABNORMAL LOW (ref >60)
Glucose, Bld: 135 mg/dL — ABNORMAL HIGH (ref 70–99)
Potassium: 4.2 mmol/L (ref 3.5–5.1)
Sodium: 137 mmol/L (ref 135–145)
Total Bilirubin: 1.9 mg/dL — ABNORMAL HIGH (ref 0.3–1.2)
Total Protein: 6.2 g/dL — ABNORMAL LOW (ref 6.5–8.1)

## 2021-12-11 LAB — PROTIME-INR
INR: 2.3 — ABNORMAL HIGH (ref 0.8–1.2)
Prothrombin Time: 25.3 seconds — ABNORMAL HIGH (ref 11.4–15.2)

## 2021-12-11 LAB — TROPONIN I (HIGH SENSITIVITY): Troponin I (High Sensitivity): 109 ng/L (ref <18)

## 2021-12-11 LAB — BRAIN NATRIURETIC PEPTIDE: B Natriuretic Peptide: 233.3 pg/mL — ABNORMAL HIGH (ref 0.0–100.0)

## 2021-12-11 NOTE — ED Triage Notes (Signed)
Pt BIB EMS from [facility], for [facility] pt just Dc'd yesterday following hospital admission for the same. Facility reports SpO2 in the 80's improved to 100% with 15L NRB. Peripheral cyanosis noted on the nail beds. Hx of CHF

## 2021-12-11 NOTE — ED Provider Notes (Signed)
11:26 PM Assumed care from Dr. Johnney Killian, please see their note for full history, physical and decision making until this point. In brief this is a 88 y.o. year old male who presented to the ED tonight with Shortness of Breath     Discharged yesterday for chf exacerbation. Back with same. Was initially quite hypoxic on nonrebreather but has weaned down close to his baseline. Still tachypneic but states dyspnea quite a bit better.   Family states patient was discharged without oxygen. Had an acute event tonight where he became clammy, pale and diaphoretic so they started oxygen and called EMS. No chest pain. On exam he has diminished BS bilaterally with tachypnea and oxygen of 95 on 5L, will switch to 2L and see how he does with that.    Discharge instructions, including strict return precautions for new or worsening symptoms, given. Patient and/or family verbalized understanding and agreement with the plan as described.   Labs, studies and imaging reviewed by myself and considered in medical decision making if ordered. Imaging interpreted by radiology.  Labs Reviewed  CBC WITH DIFFERENTIAL/PLATELET - Abnormal; Notable for the following components:      Result Value   RDW 17.5 (*)    Platelets 88 (*)    Lymphs Abs 0.1 (*)    Abs Immature Granulocytes 0.16 (*)    All other components within normal limits  PROTIME-INR - Abnormal; Notable for the following components:   Prothrombin Time 25.3 (*)    INR 2.3 (*)    All other components within normal limits  BRAIN NATRIURETIC PEPTIDE  COMPREHENSIVE METABOLIC PANEL  TROPONIN I (HIGH SENSITIVITY)    DG Chest Portable 1 View    (Results Pending)    No follow-ups on file.    Jamacia Jester, Corene Cornea, MD 12/13/21 [phone]

## 2021-12-11 NOTE — Telephone Encounter (Signed)
Transition Care Management Unsuccessful Follow-up Telephone Call  Date of discharge and from where:  [facility], 12/10/2021  Attempts:  1st Attempt  Reason for unsuccessful TCM follow-up call:  Unable to reach patient, Unable to be reached ;due to release into a skilled nursing facility.

## 2021-12-11 NOTE — ED Notes (Signed)
MD Mesner made aware of Trop 109

## 2021-12-11 NOTE — ED Provider Notes (Signed)
[facility] EMERGENCY DEPARTMENT Provider Note   CSN: [id number] Arrival date & time: 12/11/21  2004     History  Chief Complaint  Patient presents with   Shortness of Breath    Peter Singh is a 88 y.o. male.  HPI Patient was just discharged from the hospital yesterday for acute respiratory failure with hypoxia.  Patient has history of atrial fibrillation on Coumadin, hypertension, chronic suprapubic catheter.  Patient was diuresed and had improvement in respiratory status.  Echocardiogram shows severe diastolic dysfunction.  Patient was diagnosed with demand ischemia due to acute CHF.  Patient lives at [place].  This evening he had increased difficulty breathing.  Reportedly he is oxygen saturations dropped into the 80s and temporarily required 100% 15 L nonrebreather mask.  At this time patient reports he feels back to normal.  He is not endorsing chest pain    Home Medications Prior to Admission medications   Medication Sig Start Date End Date Taking? Authorizing Provider  acetaminophen (TYLENOL) 500 MG tablet Take 500 mg by mouth 2 (two) times daily.   Yes [provider]  atorvastatin (LIPITOR) 20 MG tablet Take 20 mg by mouth every evening.   Yes [provider]  cholecalciferol (VITAMIN D3) 25 MCG (1000 UT) tablet Take 2,000 Units by mouth daily.    Yes [provider]  DULoxetine (CYMBALTA) 20 MG capsule Take 20 mg by mouth daily.   Yes [provider]  furosemide (LASIX) 20 MG tablet Take 20 mg by mouth See admin instructions. Tuesday,Thursday,saturday   Yes [provider]  methenamine (MANDELAMINE) 1 g tablet Take 1,000 mg by mouth 2 (two) times daily.   Yes [provider]  mupirocin ointment (BACTROBAN) 2 % Apply 1 application. topically See admin instructions. Bid x  7 days   Yes [provider]  polyethylene glycol (MIRALAX / GLYCOLAX) 17 g packet Take 17 g by mouth daily.   Yes  [provider]  potassium chloride (MICRO-K) 10 MEQ CR capsule Take 10 mEq by mouth See admin instructions. Tuesday,Thursday,saturday   Yes [provider]  triamcinolone lotion (KENALOG) 0.1 % Apply 1 application. topically 2 (two) times daily as needed (rash on back).   Yes [provider]  warfarin (COUMADIN) 4 MG tablet Take 4 mg by mouth every Monday, Wednesday, and Friday.   Yes [provider]  warfarin (COUMADIN) 5 MG tablet Take 5 mg by mouth See admin instructions. Sunday,Tuesday,Thursday,saturday   Yes [provider]  nitrofurantoin, macrocrystal-monohydrate, (MACROBID) 100 MG capsule Take 100 mg by mouth 2 (two) times daily. Patient not taking: Reported on 12/12/2021    [provider]      Allergies    Penicillins    Review of Systems   Review of Systems 10 systems reviewed negative except as per HPI Physical Exam Updated Vital Signs BP 130/79 (BP Location: Left Arm)   Pulse 98   Temp 98.3 F (36.8 C) (Oral)   Resp (!) 26   Ht '6\' 7"'$  (2.007 m)   Wt 117.1 kg   SpO2 92%   BMI 29.08 kg/m  Physical Exam Constitutional:      Comments: Patient is alert.  Mild increased work of breathing at rest  HENT:     Mouth/Throat:     Pharynx: Oropharynx is clear.  Eyes:     Extraocular Movements: Extraocular movements intact.  Cardiovascular:     Comments: Irregular with occasional ectopic beats. Pulmonary:  Comments: Mild increased work of breathing at rest.  Basilar crackles Abdominal:     General: There is no distension.     Palpations: Abdomen is soft.     Tenderness: There is no abdominal tenderness. There is no guarding.  Musculoskeletal:     Cervical back: Neck supple.     Comments: 1-2+ pitting edema bilateral lower extremities  Skin:    General: Skin is warm and dry.  Neurological:     Comments: Patient is alert.  He is following commands.  No focal motor deficits.  Psychiatric:        Mood and Affect:  Mood normal.    ED Results / Procedures / Treatments   Labs (all labs ordered are listed, but only abnormal results are displayed) Labs Reviewed  BRAIN NATRIURETIC PEPTIDE - Abnormal; Notable for the following components:      Result Value   B Natriuretic Peptide 233.3 (*)    All other components within normal limits  COMPREHENSIVE METABOLIC PANEL - Abnormal; Notable for the following components:   Glucose, Bld 135 (*)    BUN 36 (*)    Creatinine, Ser 1.65 (*)    Total Protein 6.2 (*)    Albumin 3.4 (*)    Total Bilirubin 1.9 (*)    GFR, Estimated 40 (*)    All other components within normal limits  CBC WITH DIFFERENTIAL/PLATELET - Abnormal; Notable for the following components:   RDW 17.5 (*)    Platelets 88 (*)    Lymphs Abs 0.1 (*)    Abs Immature Granulocytes 0.16 (*)    All other components within normal limits  PROTIME-INR - Abnormal; Notable for the following components:   Prothrombin Time 25.3 (*)    INR 2.3 (*)    All other components within normal limits  TROPONIN I (HIGH SENSITIVITY) - Abnormal; Notable for the following components:   Troponin I (High Sensitivity) 109 (*)    All other components within normal limits  TROPONIN I (HIGH SENSITIVITY) - Abnormal; Notable for the following components:   Troponin I (High Sensitivity) 192 (*)    All other components within normal limits    EKG EKG Interpretation  Date/Time:  Wednesday December 11 2021 20:51:50 EDT Ventricular Rate:  104 PR Interval:    QRS Duration: 106 QT Interval:  361 QTC Calculation: 427 R Axis:   -83 Text Interpretation: Atrial fibrillation Paired ventricular premature complexes Left anterior fascicular block LVH with secondary repolarization abnormality Anterior infarct, old frequent pvc compared to previous, otherwise unchanged Confirmed by Charlesetta Shanks [id number]) on 12/11/2021 9:50:14 PM  Radiology DG Chest Portable 1 View  Result Date: 12/11/2021 CLINICAL DATA:  eval for sob EXAM: PORTABLE  CHEST 1 VIEW COMPARISON:  Chest x-ray 12/08/2021 FINDINGS: Persistent cardiomegaly. The heart and mediastinal contours are unchanged. Aortic calcifications No focal consolidation. No pulmonary edema. No pleural effusion. No pneumothorax. No acute osseous abnormality. IMPRESSION: 1. Cardiomegaly with no active disease. Underlying pericardial effusion not excluded on AP portable technique. 2.  Aortic Atherosclerosis (ICD10-I70.0). Electronically Signed   By: Iven Finn M.D.   On: 12/11/2021 23:45    Procedures Procedures    Medications Ordered in ED Medications  furosemide (LASIX) injection 40 mg (40 mg Intravenous Given 12/12/21 0016)    ED Course/ Medical Decision Making/ A&P                           Medical Decision Making Amount and/or Complexity  of Data Reviewed Labs: ordered. Radiology: ordered.  Risk Prescription drug management. Decision regarding hospitalization.   Patient presents as outlined.  He has significant comorbid illness and just discharged from the hospital yesterday for CHF.  At this time patient had another episode of hypoxia and shortness of breath.  We will proceed with chest x-ray, EKG, basic lab work.  At this time patient has stabilized on supplemental oxygen 4 L, blood pressure and heart rate stable currently.    Dr. Dayna Barker to review diagnostic results for final disposition.       Final Clinical Impression(s) / ED Diagnoses Final diagnoses:  Shortness of breath  Severe comorbid illness  Acute on chronic congestive heart failure, unspecified heart failure type [facility])    Rx / DC Orders ED Discharge Orders     None         Charlesetta Shanks, MD 12/12/21 [phone]

## 2021-12-12 ENCOUNTER — Inpatient Hospital Stay (HOSPITAL_COMMUNITY): Payer: Medicare Other

## 2021-12-12 DIAGNOSIS — K5909 Other constipation: Secondary | ICD-10-CM | POA: Diagnosis present

## 2021-12-12 DIAGNOSIS — Z8673 Personal history of transient ischemic attack (TIA), and cerebral infarction without residual deficits: Secondary | ICD-10-CM | POA: Diagnosis not present

## 2021-12-12 DIAGNOSIS — R338 Other retention of urine: Secondary | ICD-10-CM | POA: Diagnosis present

## 2021-12-12 DIAGNOSIS — R4182 Altered mental status, unspecified: Secondary | ICD-10-CM | POA: Diagnosis not present

## 2021-12-12 DIAGNOSIS — I248 Other forms of acute ischemic heart disease: Secondary | ICD-10-CM | POA: Diagnosis present

## 2021-12-12 DIAGNOSIS — I739 Peripheral vascular disease, unspecified: Secondary | ICD-10-CM | POA: Diagnosis present

## 2021-12-12 DIAGNOSIS — Z7401 Bed confinement status: Secondary | ICD-10-CM | POA: Diagnosis not present

## 2021-12-12 DIAGNOSIS — I130 Hypertensive heart and chronic kidney disease with heart failure and stage 1 through stage 4 chronic kidney disease, or unspecified chronic kidney disease: Secondary | ICD-10-CM | POA: Diagnosis present

## 2021-12-12 DIAGNOSIS — J9601 Acute respiratory failure with hypoxia: Secondary | ICD-10-CM

## 2021-12-12 DIAGNOSIS — I272 Pulmonary hypertension, unspecified: Secondary | ICD-10-CM | POA: Diagnosis present

## 2021-12-12 DIAGNOSIS — Z9359 Other cystostomy status: Secondary | ICD-10-CM | POA: Diagnosis not present

## 2021-12-12 DIAGNOSIS — D696 Thrombocytopenia, unspecified: Secondary | ICD-10-CM | POA: Diagnosis present

## 2021-12-12 DIAGNOSIS — Z880 Allergy status to penicillin: Secondary | ICD-10-CM | POA: Diagnosis not present

## 2021-12-12 DIAGNOSIS — Z66 Do not resuscitate: Secondary | ICD-10-CM | POA: Diagnosis present

## 2021-12-12 DIAGNOSIS — N401 Enlarged prostate with lower urinary tract symptoms: Secondary | ICD-10-CM | POA: Diagnosis present

## 2021-12-12 DIAGNOSIS — N179 Acute kidney failure, unspecified: Secondary | ICD-10-CM | POA: Diagnosis present

## 2021-12-12 DIAGNOSIS — N182 Chronic kidney disease, stage 2 (mild): Secondary | ICD-10-CM | POA: Diagnosis present

## 2021-12-12 DIAGNOSIS — Z823 Family history of stroke: Secondary | ICD-10-CM | POA: Diagnosis not present

## 2021-12-12 DIAGNOSIS — Z79899 Other long term (current) drug therapy: Secondary | ICD-10-CM | POA: Diagnosis not present

## 2021-12-12 DIAGNOSIS — I482 Chronic atrial fibrillation, unspecified: Secondary | ICD-10-CM | POA: Diagnosis present

## 2021-12-12 DIAGNOSIS — Z7901 Long term (current) use of anticoagulants: Secondary | ICD-10-CM | POA: Diagnosis not present

## 2021-12-12 DIAGNOSIS — R0602 Shortness of breath: Secondary | ICD-10-CM | POA: Diagnosis present

## 2021-12-12 DIAGNOSIS — Z20822 Contact with and (suspected) exposure to covid-19: Secondary | ICD-10-CM | POA: Diagnosis present

## 2021-12-12 DIAGNOSIS — E785 Hyperlipidemia, unspecified: Secondary | ICD-10-CM | POA: Diagnosis present

## 2021-12-12 DIAGNOSIS — F0390 Unspecified dementia without behavioral disturbance: Secondary | ICD-10-CM | POA: Diagnosis present

## 2021-12-12 DIAGNOSIS — I5033 Acute on chronic diastolic (congestive) heart failure: Secondary | ICD-10-CM | POA: Diagnosis present

## 2021-12-12 DIAGNOSIS — I252 Old myocardial infarction: Secondary | ICD-10-CM | POA: Diagnosis not present

## 2021-12-12 LAB — PROTIME-INR
INR: 2.6 — ABNORMAL HIGH (ref 0.8–1.2)
Prothrombin Time: 27.6 seconds — ABNORMAL HIGH (ref 11.4–15.2)

## 2021-12-12 LAB — BASIC METABOLIC PANEL
Anion gap: 9 (ref 5–15)
BUN: 37 mg/dL — ABNORMAL HIGH (ref 8–23)
CO2: 25 mmol/L (ref 22–32)
Calcium: 9.7 mg/dL (ref 8.9–10.3)
Chloride: 104 mmol/L (ref 98–111)
Creatinine, Ser: 1.67 mg/dL — ABNORMAL HIGH (ref 0.61–1.24)
GFR, Estimated: 39 mL/min — ABNORMAL LOW (ref >60)
Glucose, Bld: 124 mg/dL — ABNORMAL HIGH (ref 70–99)
Potassium: 4 mmol/L (ref 3.5–5.1)
Sodium: 138 mmol/L (ref 135–145)

## 2021-12-12 LAB — URINALYSIS, ROUTINE W REFLEX MICROSCOPIC
Bacteria, UA: NONE SEEN
Bilirubin Urine: NEGATIVE
Glucose, UA: NEGATIVE mg/dL
Ketones, ur: NEGATIVE mg/dL
Nitrite: NEGATIVE
Protein, ur: 100 mg/dL — AB
RBC / HPF: >50 RBC/hpf — ABNORMAL HIGH (ref 0–5)
Specific Gravity, Urine: 1.015 (ref 1.005–1.030)
WBC, UA: >50 WBC/hpf — ABNORMAL HIGH (ref 0–5)
pH: 5 (ref 5.0–8.0)

## 2021-12-12 LAB — CBC
HCT: 40.2 % (ref 39.0–52.0)
Hemoglobin: 12.5 g/dL — ABNORMAL LOW (ref 13.0–17.0)
MCH: 27.4 pg (ref 26.0–34.0)
MCHC: 31.1 g/dL (ref 30.0–36.0)
MCV: 88.2 fL (ref 80.0–100.0)
Platelets: UNDETERMINED 10*3/uL (ref 150–400)
RBC: 4.56 MIL/uL (ref 4.22–5.81)
RDW: 17.3 % — ABNORMAL HIGH (ref 11.5–15.5)
WBC: 4.2 10*3/uL (ref 4.0–10.5)
nRBC: 0 % (ref 0.0–0.2)

## 2021-12-12 LAB — TROPONIN I (HIGH SENSITIVITY)
Troponin I (High Sensitivity): 192 ng/L (ref <18)
Troponin I (High Sensitivity): 238 ng/L (ref <18)

## 2021-12-12 MED ORDER — ATORVASTATIN CALCIUM 10 MG PO TABS
20.0000 mg | ORAL_TABLET | Freq: Every evening | ORAL | Status: DC
  Administered 2021-12-12 – 2021-12-15 (×4): 20 mg via ORAL
  Filled 2021-12-12 (×4): qty 2

## 2021-12-12 MED ORDER — POLYETHYLENE GLYCOL 3350 17 G PO PACK
17.0000 g | PACK | Freq: Every day | ORAL | Status: DC
Start: 2021-12-12 — End: 2021-12-16
  Administered 2021-12-12 – 2021-12-16 (×5): 17 g via ORAL
  Filled 2021-12-12 (×5): qty 1

## 2021-12-12 MED ORDER — FUROSEMIDE 10 MG/ML IJ SOLN
40.0000 mg | Freq: Once | INTRAMUSCULAR | Status: AC
  Administered 2021-12-12: 40 mg via INTRAVENOUS
  Filled 2021-12-12: qty 4

## 2021-12-12 MED ORDER — DULOXETINE HCL 20 MG PO CPEP
20.0000 mg | ORAL_CAPSULE | Freq: Every day | ORAL | Status: DC
  Administered 2021-12-12 – 2021-12-16 (×5): 20 mg via ORAL
  Filled 2021-12-12 (×7): qty 1

## 2021-12-12 MED ORDER — WARFARIN SODIUM 4 MG PO TABS
4.0000 mg | ORAL_TABLET | Freq: Once | ORAL | Status: AC
  Administered 2021-12-12: 4 mg via ORAL
  Filled 2021-12-12: qty 1

## 2021-12-12 MED ORDER — ACETAMINOPHEN 325 MG PO TABS
650.0000 mg | ORAL_TABLET | Freq: Four times a day (QID) | ORAL | Status: DC | PRN
  Administered 2021-12-12 – 2021-12-14 (×2): 650 mg via ORAL
  Filled 2021-12-12 (×2): qty 2

## 2021-12-12 MED ORDER — FUROSEMIDE 10 MG/ML IJ SOLN
60.0000 mg | Freq: Two times a day (BID) | INTRAMUSCULAR | Status: DC
Start: 2021-12-12 — End: 2021-12-14
  Administered 2021-12-12 – 2021-12-14 (×5): 60 mg via INTRAVENOUS
  Filled 2021-12-12 (×6): qty 6

## 2021-12-12 MED ORDER — WARFARIN - PHARMACIST DOSING INPATIENT: Freq: Every day | Status: DC

## 2021-12-12 MED ORDER — ACETAMINOPHEN 650 MG RE SUPP: 650.0000 mg | Freq: Four times a day (QID) | RECTAL | Status: DC | PRN

## 2021-12-12 NOTE — Progress Notes (Signed)
ANTICOAGULATION CONSULT NOTE - Initial Consult  Pharmacy Consult for heparin Indication: chest pain/ACS and atrial fibrillation  Allergies  Allergen Reactions   Penicillins Rash    Has patient had a PCN reaction causing immediate rash, facial/tongue/throat swelling, SOB or lightheadedness with hypotension: unknown Has patient had a PCN reaction causing severe rash involving mucus membranes or skin necrosis: unknown Has patient had a PCN reaction that required hospitalization : unknown Has patient had a PCN reaction occurring within the last 10 years: no If all of the above answers are "NO", then may proceed with Cephalosporin use.     Patient Measurements: Height: '6\' 7"'$  (200.7 cm) Weight: 117.1 kg (258 lb 2.5 oz) IBW/kg (Calculated) : 93.7  Vital Signs: Temp: 98.3 F (36.8 C) (06/07 2009) Temp Source: Oral (06/07 2009) BP: 138/59 (06/08 0533) Pulse Rate: 86 (06/08 0533)  Labs: Recent Labs    12/10/21 0417 12/11/21 2010 12/11/21 2344  HGB  --  13.2  --   HCT  --  43.1  --   PLT  --  88*  --   LABPROT 25.2* 25.3*  --   INR 2.3* 2.3*  --   CREATININE 1.40* 1.65*  --   TROPONINIHS  --  109* 192*    Estimated Creatinine Clearance: 46 mL/min (A) (by C-G formula based on SCr of 1.65 mg/dL (H)).   Medical History: Past Medical History:  Diagnosis Date   Adult failure to thrive    Per incoming records from [facility]   Allergic rhinitis    Per incoming records from [facility]   Atrial fibrillation [facility])    BPH (benign prostatic hyperplasia)    Per incoming records from [place] of kidney [facility])    Right, Per incoming records from [place]    Per incoming records from [facility]   Cerebrovascular accident, late effects    Per incoming records from [place] kidney disease, stage III (moderate) ([place])    Per incoming records from  [facility])    Per incoming records from [facility]   Cognitive impairment    Mild, Per incoming records from [facility]   Constipation    Per incoming records from [facility]   Dementia [facility])    Diarrhea    Better off Aricept, Per incoming records from [facility]   Diastolic dysfunction    on 2D Echo 07/2009 and 2016, Per incoming records from [facility]   Diverticulosis    Mild, Per incoming records from [facility]   Diverticulosis of colon (without mention of hemorrhage)    ED (erectile dysfunction)    Per incoming records from [facility]   History of Coumadin therapy    Per incoming records from [facility]   History of echocardiogram 09/2014   Per incoming records from [facility]   Hyperlipemia    Hypertension    Kidney mass    Per incoming records from [place] leg edema    Per incoming records from [facility])    Neuropathy    Per incoming records from [facility]   OA (osteoarthritis)    Per incoming records from [facility]   Peripheral neuropathy    Per incoming records from [place]  history of colonic polyps 1999 & 2004   adenomatous polyps   Pulmonary arterial hypertension ([place])    Per incoming records from [place]    Per incoming records from [facility]   Thrombocytopenia [facility]) 2017   Per incoming records from [facility]   UTI (urinary tract infection)    Sepsis, Per incoming records from [place] hypertrophy    Gilford Rile as ambulation aid    Per incoming records from [place]: 88yo male had been recently admitted for CHF  exacerbation, now c/o SOB, troponin found to be elevated and trending up, to transition from Coumadin (for Afib) to UFH; INR at goal though last Coumadin dose was apparently 6/4 while admitted.  Goal of Therapy:  Heparin level 0.3-0.7 units/ml Monitor platelets by anticoagulation protocol: Yes   Plan:  Will hold off on heparin until INR <2.  Wynona Neat, PharmD, BCPS  12/12/2021,5:44 AM

## 2021-12-12 NOTE — Progress Notes (Addendum)
Patient seen and examined, admitted earlier this morning by Dr. Marlowe Sax, please see her H&P for details, briefly Mr. Gasper is a 89/M from [facility] with history of dementia, chronic diastolic CHF, chronic A-fib on Coumadin, renal mass, history of CVA, CKD stage II, BPH, h/o urinary retention with chronic indwelling suprapubic catheter just discharged from [facility] 6/6 after admission for decompensated CHF and elevated troponin, followed by cardiology, briefly diuresed and then discharged home on Lasix 20 Mg 3 times a week, also treated for UTI. -Brought back to the ED 6/7 with acute onset hypoxia and shortness of breath, patient is unable to provide any meaningful history -Labs in the ED noted white count of 5.1, hemoglobin 13, platelets 88 K stable, sodium 137, creatinine 1.6, T. bili 1.9, troponin 109-192, INR 2.3, BNP 233, chest x-ray without acute findings Exam: Chronically ill elderly male laying in bed, awake alert oriented to self only, moderate cognitive deficits CVS: S1-S2, irregularly irregular rhythm Lungs: Few basilar rales otherwise clear Abdomen: Soft, nontender, bowel sounds present GU: Suprapubic catheter noted Extremities: 1+ edema Neuro: Moves all extremities  Acute hypoxic respiratory failure -I suspect this is secondary to diastolic CHF, may have an infectious component as well, low-grade fever in the ED, will repeat two-view chest x-ray -Continue IV Lasix -Clinically do not suspect acute PE, he is chronically anticoagulated with Coumadin and was on heparin gtt during his recent hospitalization as well  Low-grade fever -Source not clear, UTI is always a possibility in the setting of chronic indwelling catheter -Check UA and urine culture -Repeat chest x-ray  Elevated troponin -Troponin from 100s to 200 range now, clinically do not suspect ACS, during his recent admission last week it peaked at 319, seen by cards last week, recommended medical management -2D echo on 6/5  with severe LVH, grade 3 DD, preserved EF, moderate PAH and decreased RV function  AKI on CKD 2 -Mild baseline creatinine around 1.2, could be cardiorenal, monitor with diuretics  Chronic A-fib -Heart rate controlled, INR therapeutic, continue Coumadin we will discontinue IV heparin  BPH, chronic urinary retention -With suprapubic catheter, reportedly completed ABX for UTI yesterday -Will reculture given low-grade fever this morning -From his wife when his catheter was last changed  Dementia -Oriented to self only, delirium precautions  CODE STATUS: DNR, Attempted to reach patient's spouse Rosamaria Lints x2 without success today  Domenic Polite, MD

## 2021-12-12 NOTE — ED Notes (Signed)
Admitting MD at bedside and notified of troponin increased and low grade fever

## 2021-12-12 NOTE — ED Notes (Signed)
Pt caught trying to get out of bed.  Bed alarm placed under Pt.  Family at bedside.

## 2021-12-12 NOTE — Progress Notes (Signed)
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

## 2021-12-12 NOTE — H&P (Signed)
History and Physical    Peter Singh DOB: [birth year] DOA: 12/11/2021  PCP: [name], MD  Patient coming from:  Nursing home  Chief Complaint: Shortness of breath  HPI: Peter Singh is a 88 y.o. male with medical history significant of chronic A-fib on Coumadin, chronic HFpEF, renal mass, CVA, CKD stage II, dementia, hypertension, hyperlipidemia, PVD, pulmonary hypertension, thrombocytopenia, BPH, chronic urinary retention with suprapubic catheter.  Recently admitted 6/4-6/6 for decompensated CHF and type II NSTEMI.  Discharged on Lasix 20 mg 3 times a week.  Also treated for UTI.  Patient presents with shortness of breath.  Oxygen saturation dropped to the 80s at his facility and initially required 15 L O2 via nonrebreather.  Later satting well on 4 L O2 in the ED.  Labs showing WBC 5.1, hemoglobin 13.2, platelet count 88k (stable).  Sodium 137, potassium 4.2, chloride 103, bicarb 22, BUN 36, creatinine 1.6 (baseline 1.0-1.2), glucose 135.  T. bili 1.9, slightly elevated on previous labs as well.  Remainder of LFTs normal.  High-sensitivity troponin 109 >192.  INR 2.3.  BNP 233, improved compared to recent labs.  Chest x-ray showing cardiomegaly; underlying pericardial effusion not excluded.  No evidence of pneumonia or pulmonary edema. Patient was given IV Lasix 40 mg and started on IV heparin.  Patient has dementia and not able to give a meaningful history.  He is not sure why he was sent to the ED.  Denies shortness of breath, orthopnea, or chest pain.  No other complaints.  Review of Systems:  Review of Systems  All other systems reviewed and are negative.   Past Medical History:  Diagnosis Date   Adult failure to thrive    Per incoming records from [facility]   Allergic rhinitis    Per incoming records from [facility]   Atrial fibrillation [facility])    BPH (benign prostatic hyperplasia)    Per incoming records from [place] of kidney [facility])    Right, Per incoming records from [place]    Per incoming records from [facility]   Cerebrovascular accident, late effects    Per incoming records from [place] kidney disease, stage III (moderate) ([place])    Per incoming records from [place] [facility])    Per incoming records from [facility]   Cognitive impairment    Mild, Per incoming records from [facility]   Constipation    Per incoming records from [facility]   Dementia [facility])    Diarrhea    Better off Aricept, Per incoming records from [facility]   Diastolic dysfunction    on 2D Echo 07/2009 and 2016, Per incoming records from [facility]   Diverticulosis    Mild, Per incoming records from [facility]   Diverticulosis of colon (without mention of hemorrhage)    ED (erectile dysfunction)    Per incoming records from [facility]   History of Coumadin therapy    Per incoming records from [facility]   History of echocardiogram 09/2014   Per incoming records from [facility]   Hyperlipemia    Hypertension    Kidney mass    Per incoming records from [place] leg edema    Per incoming records from [place] [facility])  Neuropathy    Per incoming records from [place] (osteoarthritis)    Per incoming records from [facility]   Peripheral neuropathy    Per incoming records from [place] history of colonic polyps 1999 & 2004   adenomatous polyps   Pulmonary arterial hypertension ([place])    Per incoming records from [place]    Per incoming records from [facility]    Thrombocytopenia [facility]) 2017   Per incoming records from [facility]   UTI (urinary tract infection)    Sepsis, Per incoming records from [place] as ambulation aid    Per incoming records from [facility]    Past Surgical History:  Procedure Laterality Date   APPENDECTOMY     Per incoming records from [place]     Per incoming records from [place]  11/21/2019   KNEE SURGERY     right   PILONIDAL CYST DRAINAGE     POLYPECTOMY     Per incoming records from [place] tumor lumbar spine     SPINE SURGERY     tumor removed   THORACIC 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, Per incoming records from [place]       reports that he quit smoking about 46 years ago. His smoking use included cigarettes. He smoked an average of 3 packs per day. He has never used smokeless tobacco. He reports current alcohol use. He reports that he does not use drugs.  Allergies  Allergen Reactions   Penicillins Rash    Has patient had a PCN reaction causing immediate rash, facial/tongue/throat swelling, SOB or lightheadedness with hypotension: unknown Has patient had a PCN reaction causing severe rash involving mucus membranes or skin necrosis: unknown Has patient had a PCN reaction that required hospitalization : unknown Has patient had a PCN reaction occurring within the last 10 years: no If all of the above answers are "NO", then may proceed with Cephalosporin use.     Family History  Problem Relation Age of Onset   Stroke Father    CVA Father    Tuberculosis Father    Neuropathy Neg Hx     Prior to Admission medications   Medication Sig Start Date End Date Taking? Authorizing Provider  acetaminophen (TYLENOL) 500 MG  tablet Take 500 mg by mouth 2 (two) times daily.   Yes [provider]  atorvastatin (LIPITOR) 20 MG tablet Take 20 mg by mouth every evening.   Yes [provider]  cholecalciferol (VITAMIN D3) 25 MCG (1000 UT) tablet Take 2,000 Units by mouth daily.    Yes [provider]  DULoxetine (CYMBALTA) 20 MG capsule Take 20 mg by mouth daily.   Yes [provider]  furosemide (LASIX) 20 MG tablet Take 20 mg by mouth See admin instructions. Tuesday,Thursday,saturday   Yes [provider]  methenamine (MANDELAMINE) 1 g tablet Take 1,000 mg by mouth 2 (two) times daily.   Yes [provider]  mupirocin ointment (BACTROBAN) 2 % Apply 1 application. topically See admin instructions. Bid x  7 days   Yes [provider]  polyethylene glycol (MIRALAX / GLYCOLAX) 17 g packet Take 17 g by mouth daily.  Yes [provider]  potassium chloride (MICRO-K) 10 MEQ CR capsule Take 10 mEq by mouth See admin instructions. Tuesday,Thursday,saturday   Yes [provider]  triamcinolone lotion (KENALOG) 0.1 % Apply 1 application. topically 2 (two) times daily as needed (rash on back).   Yes [provider]  warfarin (COUMADIN) 4 MG tablet Take 4 mg by mouth every Monday, Wednesday, and Friday.   Yes [provider]  warfarin (COUMADIN) 5 MG tablet Take 5 mg by mouth See admin instructions. Sunday,Tuesday,Thursday,saturday   Yes [provider]  nitrofurantoin, macrocrystal-monohydrate, (MACROBID) 100 MG capsule Take 100 mg by mouth 2 (two) times daily. Patient not taking: Reported on 12/12/2021    [provider]    Physical Exam: Vitals:   12/12/21 0215 12/12/21 0315 12/12/21 0330 12/12/21 0409  BP: 126/65 109/65 137/78 (!) 146/60  Pulse: 89 63 (!) 51 85  Resp: (!) 25 (!) 23 (!) 29 19  Temp:      TempSrc:      SpO2: 99% 100% 100% 100%  Weight:      Height:        Physical Exam Vitals reviewed.   Constitutional:      General: He is not in acute distress. HENT:     Head: Normocephalic and atraumatic.  Eyes:     Extraocular Movements: Extraocular movements intact.  Cardiovascular:     Rate and Rhythm: Normal rate. Rhythm irregular.     Pulses: Normal pulses.  Pulmonary:     Effort: Pulmonary effort is normal. No respiratory distress.     Breath sounds: Normal breath sounds. No wheezing or rales.  Abdominal:     General: Bowel sounds are normal. There is no distension.     Palpations: Abdomen is soft.     Tenderness: There is no abdominal tenderness.  Musculoskeletal:     Cervical back: Normal range of motion.     Comments: 1+ pitting edema of bilateral lower legs  Skin:    General: Skin is warm and dry.  Neurological:     General: No focal deficit present.     Mental Status: He is alert.     Comments: Oriented to person and place only      Labs on Admission: I have personally reviewed following labs and imaging studies  CBC: Recent Labs  Lab 12/08/21 1904 12/09/21 0427 12/11/21 2010  WBC 4.7 3.5* 5.1  NEUTROABS 3.8  --  4.1  HGB 13.1 12.3* 13.2  HCT 41.1 38.4* 43.1  MCV 88.4 88.3 90.5  PLT 85* 85* 88*   Basic Metabolic Panel: Recent Labs  Lab 12/08/21 1904 12/09/21 0427 12/10/21 0417 12/11/21 2010  NA 136 138 138 137  K 4.2 4.1 4.1 4.2  CL 105 105 106 103  CO2 '23 25 23 22  '$ GLUCOSE 135* 109* 112* 135*  BUN '18 18 19 '$ 36*  CREATININE 1.23 1.47* 1.40* 1.65*  CALCIUM 9.7 9.7 9.9 9.7   GFR: Estimated Creatinine Clearance: 46 mL/min (A) (by C-G formula based on SCr of 1.65 mg/dL (H)). Liver Function Tests: Recent Labs  Lab 12/08/21 1904 12/11/21 2010  AST 14* 16  ALT 8 8  ALKPHOS 101 82  BILITOT 1.6* 1.9*  PROT 6.4* 6.2*  ALBUMIN 3.6 3.4*   No results for input(s): "LIPASE", "AMYLASE" in the last 168 hours. No results for input(s): "AMMONIA" in the last 168 hours. Coagulation Profile: Recent Labs  Lab 12/08/21 1904 12/09/21 0427  12/10/21 0417 12/11/21 2010  INR  2.7* 2.7* 2.3* 2.3*   Cardiac Enzymes: No results for input(s): "CKTOTAL", "CKMB", "CKMBINDEX", "TROPONINI" in the last 168 hours. BNP (last 3 results) No results for input(s): "PROBNP" in the last 8760 hours. HbA1C: No results for input(s): "HGBA1C" in the last 72 hours. CBG: No results for input(s): "GLUCAP" in the last 168 hours. Lipid Profile: No results for input(s): "CHOL", "HDL", "LDLCALC", "TRIG", "CHOLHDL", "LDLDIRECT" in the last 72 hours. Thyroid Function Tests: No results for input(s): "TSH", "T4TOTAL", "FREET4", "T3FREE", "THYROIDAB" in the last 72 hours. Anemia Panel: No results for input(s): "VITAMINB12", "FOLATE", "FERRITIN", "TIBC", "IRON", "RETICCTPCT" in the last 72 hours. Urine analysis:    Component Value Date/Time   COLORURINE RED (A) 06/02/2019 0049   APPEARANCEUR CLOUDY (A) 06/02/2019 0049   LABSPEC 1.018 06/02/2019 0049   PHURINE 6.0 06/02/2019 0049   GLUCOSEU 50 (A) 06/02/2019 0049   HGBUR LARGE (A) 06/02/2019 0049   BILIRUBINUR NEGATIVE 06/02/2019 0049   KETONESUR 5 (A) 06/02/2019 0049   PROTEINUR >=300 (A) 06/02/2019 0049   NITRITE NEGATIVE 06/02/2019 0049   LEUKOCYTESUR SMALL (A) 06/02/2019 0049    Radiological Exams on Admission: I have personally reviewed images DG Chest Portable 1 View  Result Date: 12/11/2021 CLINICAL DATA:  eval for sob EXAM: PORTABLE CHEST 1 VIEW COMPARISON:  Chest x-ray 12/08/2021 FINDINGS: Persistent cardiomegaly. The heart and mediastinal contours are unchanged. Aortic calcifications No focal consolidation. No pulmonary edema. No pleural effusion. No pneumothorax. No acute osseous abnormality. IMPRESSION: 1. Cardiomegaly with no active disease. Underlying pericardial effusion not excluded on AP portable technique. 2.  Aortic Atherosclerosis (ICD10-I70.0). Electronically Signed   By: Iven Finn M.D.   On: 12/11/2021 23:45    EKG: Independently reviewed.  A-fib, LAFB, frequent PVCs but  otherwise no significant change since prior tracing.  Assessment and Plan  Acute hypoxic respiratory failure Patient discharged from the hospital 2 days ago after being diuresed for decompensated CHF.  Echo done 3 days ago showing LVEF 50 to 24%, grade 3 diastolic dysfunction.  He returns to the ED with dyspnea and hypoxia.  Reportedly oxygen saturation dropped to the 80s at his facility and initially required 15 L O2 via nonrebreather.  Currently satting well on 4 L O2.  BNP slightly appears improved compared to recent labs.  Chest x-ray not suggestive of pneumonia or pulmonary edema. ?PE but chronically anticoagulated with Coumadin and INR within therapeutic range. -Patient was given IV Lasix 40 mg in the ED -Avoiding CTA to rule out PE given AKI.  VQ scan ordered. -Continue supplemental oxygen, wean as tolerated  Elevated troponin High-sensitivity troponin 109 >192.  Troponin elevated to the 200s to 300s during recent hospitalization as well and felt to be due to demand ischemia. ?ACS as EKG without acute ischemic changes and patient denies chest pain. ?PE given hypoxia. -Continue IV heparin -Trend troponin -VQ scan ordered to rule out PE  AKI on CKD stage II BUN 36, creatinine 1.6 (baseline 1.0-1.2).  ?Cardiorenal due to decompensated CHF. -A dose of Lasix given in the ED. -Repeat labs to check renal function -Avoid any other nephrotoxic agents  Abnormal chest x-ray finding Chest x-ray showing cardiomegaly; underlying pericardial effusion not excluded.  Echo done 3 days ago showing no evidence of pericardial effusion.  Chronic A-fib Currently rate controlled.  INR within therapeutic range. -Started on IV heparin due to concern for possible ACS versus PE  Chronic thrombocytopenia Stable, no signs of active bleeding. -Continue to monitor  Hypertension Stable. -Monitor blood pressure  closely.  Hyperlipidemia -Continue statin  Dementia without behavioral disturbance -Delirium  precautions  BPH Chronic urinary retention with suprapubic catheter Finished treatment for UTI yesterday.  No fever or leukocytosis  Chronic constipation -Continue MiraLAX  DVT prophylaxis: IV heparin gtt Code Status: DNR/DNI     Family Communication: No family available at this time. Level of care: Progressive Care Unit Admission status: It is my clinical opinion that admission to INPATIENT is reasonable and necessary because of the expectation that this patient will require hospital care that crosses at least 2 midnights to treat this condition based on the medical complexity of the problems presented.  Given the aforementioned information, the predictability of an adverse outcome is felt to be significant.   Shela Leff MD [facility]  If 7PM-7AM, please contact night-coverage [email or web address]  12/12/2021, 5:06 AM

## 2021-12-12 NOTE — ED Notes (Addendum)
Pt's oxygen noted to drop into mid 80s.  Pt placed on 1L [place].  O2 increased to mid to hight 90s.

## 2021-12-12 NOTE — Progress Notes (Addendum)
ANTICOAGULATION CONSULT NOTE  Pharmacy Consult:  Coumadin Indication:  Afib  Allergies  Allergen Reactions   Penicillins Rash    Has patient had a PCN reaction causing immediate rash, facial/tongue/throat swelling, SOB or lightheadedness with hypotension: unknown Has patient had a PCN reaction causing severe rash involving mucus membranes or skin necrosis: unknown Has patient had a PCN reaction that required hospitalization : unknown Has patient had a PCN reaction occurring within the last 10 years: no If all of the above answers are "NO", then may proceed with Cephalosporin use.     Patient Measurements: Height: '6\' 7"'$  (200.7 cm) Weight: 117.1 kg (258 lb 2.5 oz) IBW/kg (Calculated) : 93.7  Vital Signs: Temp: 98.7 F (37.1 C) (06/08 1020) Temp Source: Oral (06/08 1020) BP: 139/59 (06/08 0800) Pulse Rate: 78 (06/08 0800)  Labs: Recent Labs    12/10/21 0417 12/11/21 2010 12/11/21 2344 12/12/21 0650  HGB  --  13.2  --  12.5*  HCT  --  43.1  --  40.2  PLT  --  88*  --  PLATELET CLUMPS NOTED ON SMEAR, UNABLE TO ESTIMATE  LABPROT 25.2* 25.3*  --  27.6*  INR 2.3* 2.3*  --  2.6*  CREATININE 1.40* 1.65*  --  1.67*  TROPONINIHS  --  109* 192* 238*     Estimated Creatinine Clearance: 45.4 mL/min (A) (by C-G formula based on SCr of 1.67 mg/dL (H)).   Assessment: 88yo male admitted for CHF exacerbation, now c/o SOB, troponin found to be elevated and trending up.  Provider doubts it is ACS, so Pharmacy consulted to continue Coumadin from PTA.   INR remains therapeutic; no bleeding reported.  Coumadin held on 6/5 and it is unclear whether patient received Coumadin on 5/7.  Home Coumadin regimen:  '5mg'$  daily except '4mg'$  on MWF  Goal of Therapy:  INR 2 - 3  Monitor platelets by anticoagulation protocol: Yes   Plan:  Continue '4mg'$  PO today - resume home regimen if INR remains stable Daily PT / INR  Yazaira Speas D. Mina Marble, PharmD, BCPS, [place] 12/12/2021, 10:50 AM

## 2021-12-13 ENCOUNTER — Other Ambulatory Visit: Payer: Self-pay

## 2021-12-13 DIAGNOSIS — J9601 Acute respiratory failure with hypoxia: Secondary | ICD-10-CM | POA: Diagnosis not present

## 2021-12-13 LAB — CBC
HCT: 42.5 % (ref 39.0–52.0)
Hemoglobin: 13.8 g/dL (ref 13.0–17.0)
MCH: 28.7 pg (ref 26.0–34.0)
MCHC: 32.5 g/dL (ref 30.0–36.0)
MCV: 88.4 fL (ref 80.0–100.0)
Platelets: 88 10*3/uL — ABNORMAL LOW (ref 150–400)
RBC: 4.81 MIL/uL (ref 4.22–5.81)
RDW: 17.4 % — ABNORMAL HIGH (ref 11.5–15.5)
WBC: 4 10*3/uL (ref 4.0–10.5)
nRBC: 0 % (ref 0.0–0.2)

## 2021-12-13 LAB — BASIC METABOLIC PANEL
Anion gap: 13 (ref 5–15)
BUN: 45 mg/dL — ABNORMAL HIGH (ref 8–23)
CO2: 24 mmol/L (ref 22–32)
Calcium: 10.1 mg/dL (ref 8.9–10.3)
Chloride: 100 mmol/L (ref 98–111)
Creatinine, Ser: 1.7 mg/dL — ABNORMAL HIGH (ref 0.61–1.24)
GFR, Estimated: 39 mL/min — ABNORMAL LOW (ref >60)
Glucose, Bld: 114 mg/dL — ABNORMAL HIGH (ref 70–99)
Potassium: 4 mmol/L (ref 3.5–5.1)
Sodium: 137 mmol/L (ref 135–145)

## 2021-12-13 LAB — PROTIME-INR
INR: 2.8 — ABNORMAL HIGH (ref 0.8–1.2)
Prothrombin Time: 29.4 seconds — ABNORMAL HIGH (ref 11.4–15.2)

## 2021-12-13 LAB — URINE CULTURE: Culture: 60000 — AB

## 2021-12-13 MED ORDER — WARFARIN SODIUM 2 MG PO TABS
4.0000 mg | ORAL_TABLET | Freq: Once | ORAL | Status: AC
  Administered 2021-12-13: 4 mg via ORAL
  Filled 2021-12-13: qty 2

## 2021-12-13 NOTE — Progress Notes (Signed)
Mobility Specialist Progress Note    12/13/21 1700  Mobility  Activity Ambulated with assistance in room  Level of Assistance Moderate assist, patient does 50-74%  Assistive Device Front wheel walker  Distance Ambulated (ft) 10 ft  Activity Response Tolerated well  $Mobility charge 1 Mobility   Pre-Mobility: 84 HR, 148/90 BP, 93% SpO2  Pt received in bed and agreeable. Standby for bed mobility but modA to stand. Completed all activity on RA. Left in chair with alarm on, RN notified.   Hildred Alamin Mobility Specialist

## 2021-12-13 NOTE — Progress Notes (Signed)
ANTICOAGULATION CONSULT NOTE  Pharmacy Consult:  Coumadin Indication:  Afib  Allergies  Allergen Reactions   Penicillins Rash    Has patient had a PCN reaction causing immediate rash, facial/tongue/throat swelling, SOB or lightheadedness with hypotension: unknown Has patient had a PCN reaction causing severe rash involving mucus membranes or skin necrosis: unknown Has patient had a PCN reaction that required hospitalization : unknown Has patient had a PCN reaction occurring within the last 10 years: no If all of the above answers are "NO", then may proceed with Cephalosporin use.     Patient Measurements: Height: '6\' 7"'$  (200.7 cm) Weight: 117.1 kg (258 lb 2.5 oz) IBW/kg (Calculated) : 93.7  Vital Signs: Temp: 97.9 F (36.6 C) (06/09 0343) Temp Source: Oral (06/09 0343) BP: 141/54 (06/09 0343) Pulse Rate: 66 (06/09 0343)  Labs: Recent Labs    12/11/21 2010 12/11/21 2344 12/12/21 0650 12/13/21 0702  HGB 13.2  --  12.5* 13.8  HCT 43.1  --  40.2 42.5  PLT 88*  --  PLATELET CLUMPS NOTED ON SMEAR, UNABLE TO ESTIMATE PENDING  LABPROT 25.3*  --  27.6* 29.4*  INR 2.3*  --  2.6* 2.8*  CREATININE 1.65*  --  1.67*  --   TROPONINIHS 109* 192* 238*  --      Estimated Creatinine Clearance: 45.4 mL/min (A) (by C-G formula based on SCr of 1.67 mg/dL (H)).   Assessment: 89yo male admitted for CHF exacerbation. Pt on Coumadin PTA for hx AF.  INR therapeutic at 2.8.  Home Coumadin regimen:  '5mg'$  daily except '4mg'$  on MWF  Goal of Therapy:  INR 2 - 3  Monitor platelets by anticoagulation protocol: Yes   Plan:  Coumadin '4mg'$  PO x1 tonight Daily protime  Arrie Senate, PharmD, BCPS, [facility] Clinical Pharmacist [phone] Please check AMION for all [facility] Pharmacy numbers 12/13/2021

## 2021-12-13 NOTE — Progress Notes (Signed)
PROGRESS NOTE    Peter Singh  XQJ:[id number] DOB: 11/24/1931 DOA: 12/11/2021 PCP: [name], MD  89/M from [facility] with history of dementia, chronic diastolic CHF, chronic A-fib on Coumadin, renal mass, history of CVA, CKD stage II, BPH, h/o urinary retention with chronic indwelling suprapubic catheter just discharged from [facility] 6/6 after admission for decompensated CHF and elevated troponin, followed by cardiology, briefly diuresed and then discharged home on Lasix 20 Mg 3 times a week, also treated for UTI. -Brought back to the ED 6/7 with acute onset hypoxia and shortness of breath, patient is unable to provide any meaningful history -Labs in the ED noted white count of 5.1, hemoglobin 13, platelets 88 K stable, sodium 137, creatinine 1.6, T. bili 1.9, troponin 109-192, INR 2.3, BNP 233, chest x-ray with small bilateral effusions   Subjective: Feels better, breathing is improving   Assessment and Plan:  Acute hypoxic respiratory failure Acute on chronic diastolic CHF -secondary to diastolic CHF, do not suspect infectious process anymore, repeat chest x-ray with scant bilateral effusions -2D echo 6/5 with severe LVH, grade 3 diastolic dysfunction, preserved EF, moderate PAH and decreased RV function -Continue IV Lasix today, urine output inaccurate, -1.2 L charted -Due to advanced age, frailty and dementia he is not a candidate for aggressive work-up, GDMT limited by CKD -Clinically do not suspect acute PE, he is chronically anticoagulated with Coumadin and therapeutic -Wean O2, down to 1 L at this time -Discussed with wife regarding need for palliative care involvement, will send referral at discharge   Low-grade fever -100.3 yesterday am, isolated temp -Remains afebrile since the isolated low-grade temp yesterday, no leukocytosis, clinically improving and nontoxic, monitor clinically -Chronic Foley, UA abnormal as expected, per spouse this is changed diligently every  month at [facility]   Elevated troponin -Troponin from 100s to 200 range now, clinically do not suspect ACS, during his recent admission last week it peaked at 319, seen by cards last week, recommended medical management -2D echo on 6/5 with severe LVH, grade 3 DD, preserved EF, moderate PAH and decreased RV function   AKI on CKD 2 -Mild baseline creatinine around 1.2, renal component as well, mild bump with diuresis however remains volume overloaded, continue IV Lasix 1 more day   Chronic A-fib -Heart rate controlled, INR therapeutic, continue Coumadin    BPH, chronic urinary retention -With suprapubic catheter, reportedly completed ABX for UTI 6/7 -Discussed catheter with wife, she reports this is changed every 30 days without fail at [facility]   Dementia -Oriented to self only, delirium precautions   DVT prophylaxis: Coumadin Code Status: DNR Family Communication: Wife at bedside Disposition Plan: Back to [facility] SNF in 1 to 2 days  Consultants:    Procedures:   Antimicrobials:    Objective: Vitals:   12/13/21 0013 12/13/21 0343 12/13/21 0907 12/13/21 1311  BP: 136/68 (!) 141/54 137/61 138/76  Pulse: 77 66 87 95  Resp: '17 19 18 18  '$ Temp: 98.1 F (36.7 C) 97.9 F (36.6 C) 97.8 F (36.6 C) 97.9 F (36.6 C)  TempSrc: Oral Oral Oral Oral  SpO2: 96% 94% 94% 94%  Weight:      Height:        Intake/Output Summary (Last 24 hours) at 12/13/2021 1420 Last data filed at 12/13/2021 1402 Gross per 24 hour  Intake --  Output 1625 ml  Net -1625 ml   Filed Weights   12/11/21 2012  Weight: 117.1 kg    Examination:  General  exam: Elderly chronically ill male laying in bed, eyes closed, AAO x2, moderate cognitive deficits HEENT: Positive JVD CVS: S1-S2, regular rhythm Lungs: Few basilar rales otherwise clear Abdomen: Soft, nontender, bowel sounds present Extremities: 1+ edema Psychiatry: Poor insight and judgment    Data Reviewed:   CBC: Recent Labs  Lab  12/08/21 1904 12/09/21 0427 12/11/21 2010 12/12/21 0650 12/13/21 0702  WBC 4.7 3.5* 5.1 4.2 4.0  NEUTROABS 3.8  --  4.1  --   --   HGB 13.1 12.3* 13.2 12.5* 13.8  HCT 41.1 38.4* 43.1 40.2 42.5  MCV 88.4 88.3 90.5 88.2 88.4  PLT 85* 85* 88* PLATELET CLUMPS NOTED ON SMEAR, UNABLE TO ESTIMATE 88*   Basic Metabolic Panel: Recent Labs  Lab 12/09/21 0427 12/10/21 0417 12/11/21 2010 12/12/21 0650 12/13/21 0702  NA 138 138 137 138 137  K 4.1 4.1 4.2 4.0 4.0  CL 105 106 103 104 100  CO2 '25 23 22 25 24  '$ GLUCOSE 109* 112* 135* 124* 114*  BUN 18 19 36* 37* 45*  CREATININE 1.47* 1.40* 1.65* 1.67* 1.70*  CALCIUM 9.7 9.9 9.7 9.7 10.1   GFR: Estimated Creatinine Clearance: 44.6 mL/min (A) (by C-G formula based on SCr of 1.7 mg/dL (H)). Liver Function Tests: Recent Labs  Lab 12/08/21 1904 12/11/21 2010  AST 14* 16  ALT 8 8  ALKPHOS 101 82  BILITOT 1.6* 1.9*  PROT 6.4* 6.2*  ALBUMIN 3.6 3.4*   No results for input(s): "LIPASE", "AMYLASE" in the last 168 hours. No results for input(s): "AMMONIA" in the last 168 hours. Coagulation Profile: Recent Labs  Lab 12/09/21 0427 12/10/21 0417 12/11/21 2010 12/12/21 0650 12/13/21 0702  INR 2.7* 2.3* 2.3* 2.6* 2.8*   Cardiac Enzymes: No results for input(s): "CKTOTAL", "CKMB", "CKMBINDEX", "TROPONINI" in the last 168 hours. BNP (last 3 results) No results for input(s): "PROBNP" in the last 8760 hours. HbA1C: No results for input(s): "HGBA1C" in the last 72 hours. CBG: No results for input(s): "GLUCAP" in the last 168 hours. Lipid Profile: No results for input(s): "CHOL", "HDL", "LDLCALC", "TRIG", "CHOLHDL", "LDLDIRECT" in the last 72 hours. Thyroid Function Tests: No results for input(s): "TSH", "T4TOTAL", "FREET4", "T3FREE", "THYROIDAB" in the last 72 hours. Anemia Panel: No results for input(s): "VITAMINB12", "FOLATE", "FERRITIN", "TIBC", "IRON", "RETICCTPCT" in the last 72 hours. Urine analysis:    Component Value  Date/Time   COLORURINE AMBER (A) 12/12/2021 0846   APPEARANCEUR CLOUDY (A) 12/12/2021 0846   LABSPEC 1.015 12/12/2021 0846   PHURINE 5.0 12/12/2021 0846   GLUCOSEU NEGATIVE 12/12/2021 0846   HGBUR MODERATE (A) 12/12/2021 0846   BILIRUBINUR NEGATIVE 12/12/2021 0846   KETONESUR NEGATIVE 12/12/2021 0846   PROTEINUR 100 (A) 12/12/2021 0846   NITRITE NEGATIVE 12/12/2021 0846   LEUKOCYTESUR MODERATE (A) 12/12/2021 0846   Sepsis Labs: '@LABRCNTIP'$ (procalcitonin:4,lacticidven:4)  ) Recent Results (from the past 240 hour(s))  SARS Coronavirus 2 by RT PCR (hospital order, performed in [place] hospital lab) *cepheid single result test* Anterior Nasal Swab     Status: None   Collection Time: 12/08/21  7:04 PM   Specimen: Anterior Nasal Swab  Result Value Ref Range Status   SARS Coronavirus 2 by RT PCR NEGATIVE NEGATIVE Final    Comment: (NOTE) SARS-CoV-2 target nucleic acids are NOT DETECTED.  The SARS-CoV-2 RNA is generally detectable in upper and lower respiratory specimens during the acute phase of infection. The lowest concentration of SARS-CoV-2 viral copies this assay can detect is 250 copies / mL. A  negative result does not preclude SARS-CoV-2 infection and should not be used as the sole basis for treatment or other patient management decisions.  A negative result may occur with improper specimen collection / handling, submission of specimen other than nasopharyngeal swab, presence of viral mutation(s) within the areas targeted by this assay, and inadequate number of viral copies (<250 copies / mL). A negative result must be combined with clinical observations, patient history, and epidemiological information.  Fact Sheet for Patients:   https://www.patel.info/  Fact Sheet for Healthcare Providers: https://hall.com/  This test is not yet approved or  cleared by the [place] FDA and has been authorized for detection and/or  diagnosis of SARS-CoV-2 by FDA under an Emergency Use Authorization (EUA).  This EUA will remain in effect (meaning this test can be used) for the duration of the COVID-19 declaration under Section 564(b)(1) of the Act, 21 U.S.C. section 360bbb-3(b)(1), unless the authorization is terminated or revoked sooner.  Performed at [facility], [place] [address], [place], [place] [address]   Urine Culture     Status: None (Preliminary result)   Collection Time: 12/12/21  8:22 AM   Specimen: Urine, Catheterized  Result Value Ref Range Status   Specimen Description URINE, CATHETERIZED  Final   Special Requests NONE  Final   Culture   Final    CULTURE REINCUBATED FOR BETTER GROWTH Performed at [facility], [place] [address], [place], [place] [address]    Report Status PENDING  Incomplete     Radiology Studies: DG Chest 2 View  Result Date: 12/12/2021 CLINICAL DATA:  Shortness of breath history of hypertension and AFib. EXAM: CHEST - 2 VIEW COMPARISON:  Radiograph December 11, 2021 FINDINGS: Similar enlarged cardiac silhouette. Unchanged cardiomediastinal contours. Aortic atherosclerosis. No overt pulmonary edema or focal airspace consolidation. Trace bilateral pleural effusions. Thoracic spondylosis. IMPRESSION: Enlarged cardiac silhouette and central vascular prominence without overt pulmonary edema. Trace bilateral pleural effusions. Electronically Signed   By: Dahlia Bailiff M.D.   On: 12/12/2021 11:47   DG Chest Portable 1 View  Result Date: 12/11/2021 CLINICAL DATA:  eval for sob EXAM: PORTABLE CHEST 1 VIEW COMPARISON:  Chest x-ray 12/08/2021 FINDINGS: Persistent cardiomegaly. The heart and mediastinal contours are unchanged. Aortic calcifications No focal consolidation. No pulmonary edema. No pleural effusion. No pneumothorax. No acute osseous abnormality. IMPRESSION: 1. Cardiomegaly with no active disease. Underlying pericardial effusion not excluded on AP portable technique. 2.  Aortic  Atherosclerosis (ICD10-I70.0). Electronically Signed   By: Iven Finn M.D.   On: 12/11/2021 23:45     Scheduled Meds:  atorvastatin  20 mg Oral QPM   DULoxetine  20 mg Oral Daily   furosemide  60 mg Intravenous Q12H   polyethylene glycol  17 g Oral Daily   warfarin  4 mg Oral ONCE-1600   Warfarin - Pharmacist Dosing Inpatient   Does not apply q1600   Continuous Infusions:   LOS: 1 day    Time spent: 57mn    PDomenic Polite MD [facility]   12/13/2021, 2:20 PM

## 2021-12-14 DIAGNOSIS — J9601 Acute respiratory failure with hypoxia: Secondary | ICD-10-CM | POA: Diagnosis not present

## 2021-12-14 LAB — CBC
HCT: 39.5 % (ref 39.0–52.0)
Hemoglobin: 12.6 g/dL — ABNORMAL LOW (ref 13.0–17.0)
MCH: 27.6 pg (ref 26.0–34.0)
MCHC: 31.9 g/dL (ref 30.0–36.0)
MCV: 86.4 fL (ref 80.0–100.0)
Platelets: 94 10*3/uL — ABNORMAL LOW (ref 150–400)
RBC: 4.57 MIL/uL (ref 4.22–5.81)
RDW: 17.4 % — ABNORMAL HIGH (ref 11.5–15.5)
WBC: 4.1 10*3/uL (ref 4.0–10.5)
nRBC: 0 % (ref 0.0–0.2)

## 2021-12-14 LAB — BASIC METABOLIC PANEL
Anion gap: 8 (ref 5–15)
BUN: 50 mg/dL — ABNORMAL HIGH (ref 8–23)
CO2: 26 mmol/L (ref 22–32)
Calcium: 10.1 mg/dL (ref 8.9–10.3)
Chloride: 104 mmol/L (ref 98–111)
Creatinine, Ser: 1.75 mg/dL — ABNORMAL HIGH (ref 0.61–1.24)
GFR, Estimated: 37 mL/min — ABNORMAL LOW (ref >60)
Glucose, Bld: 132 mg/dL — ABNORMAL HIGH (ref 70–99)
Potassium: 3.5 mmol/L (ref 3.5–5.1)
Sodium: 138 mmol/L (ref 135–145)

## 2021-12-14 LAB — PROTIME-INR
INR: 2.7 — ABNORMAL HIGH (ref 0.8–1.2)
Prothrombin Time: 28.7 seconds — ABNORMAL HIGH (ref 11.4–15.2)

## 2021-12-14 MED ORDER — WARFARIN SODIUM 5 MG PO TABS
5.0000 mg | ORAL_TABLET | Freq: Once | ORAL | Status: AC
  Administered 2021-12-14: 5 mg via ORAL
  Filled 2021-12-14: qty 1

## 2021-12-14 MED ORDER — FUROSEMIDE 40 MG PO TABS
40.0000 mg | ORAL_TABLET | Freq: Every day | ORAL | Status: DC
  Administered 2021-12-14 – 2021-12-15 (×2): 40 mg via ORAL
  Filled 2021-12-14 (×3): qty 1

## 2021-12-14 NOTE — Evaluation (Signed)
Occupational Therapy Evaluation/Discharge Patient Details Name: Peter Singh MRN: [id number] DOB: [birth year] Today's Date: 12/14/2021   History of Present Illness Pt is an 88 y.o. male resident at [place] [facility] admitted 12/11/21 with acute hypoxic respiratory failure secondary to CHF. CXR with small bilateral pleural effusions. Of note recently discharged home 12/10/21 after admissionf or CHF. Other PMH includes dementia, CHF, afib on Coumadin, CVA, CKD, BPH, urinary retention (chronic indwelling catheter).   Clinical Impression   PTA, pt from SNF, typically ambulatory using RW with light assist and receives assist for all ADLs due to cognitive/physical deficits. Pt presents now with minor deficits in strength and endurance in comparison to baseline. Pt able to mobilize to/from bathroom using RW with no more than Min A this PM though appeared fatigued. As pt is fairly close to baseline for ADLs, will sign off at acute level and defer rehab needs to SNF. Recommend continued mobility with mobility specialists while admitted. VSS on RA.     Recommendations for follow up therapy are one component of a multi-disciplinary discharge planning process, led by the attending physician.  Recommendations may be updated based on patient status, additional functional criteria and insurance authorization.   Follow Up Recommendations  Skilled nursing-short term rehab (<3 hours/day)    Assistance Recommended at Discharge Frequent or constant Supervision/Assistance  Patient can return home with the following A little help with walking and/or transfers;A lot of help with bathing/dressing/bathroom    Functional Status Assessment  Patient has not had a recent decline in their functional status  Equipment Recommendations  None recommended by OT    Recommendations for Other Services       Precautions / Restrictions Precautions Precautions: Fall;Other (comment) Precaution Comments: chronic suprapubic  cath Restrictions Weight Bearing Restrictions: No      Mobility Bed Mobility Overal bed mobility: Needs Assistance Bed Mobility: Sit to Supine       Sit to supine: Min assist   General bed mobility comments: assist to get BLE back into bed    Transfers Overall transfer level: Needs assistance Equipment used: Rolling walker (2 wheels) Transfers: Sit to/from Stand Sit to Stand: Min assist           General transfer comment: Min A to stand from low recliner with cues to push from armrests. min guard with increased time to stand from [facility] over toilet      Balance Overall balance assessment: Needs assistance Sitting-balance support: Feet supported, Bilateral upper extremity supported, No upper extremity supported Sitting balance-Leahy Scale: Fair     Standing balance support: During functional activity, Reliant on assistive device for balance Standing balance-Leahy Scale: Poor                             ADL either performed or assessed with clinical judgement   ADL Overall ADL's : Needs assistance/impaired;At baseline Eating/Feeding: Set up;Bed level Eating/Feeding Details (indicate cue type and reason): self feeding sandwich at end of session Grooming: Min guard;Standing   Upper Body Bathing: Minimal assistance;Sitting   Lower Body Bathing: Maximal assistance;Sit to/from stand   Upper Body Dressing : Minimal assistance;Sitting   Lower Body Dressing: Maximal assistance;Sit to/from stand   Toilet Transfer: Minimal assistance;Ambulation;Rolling walker (2 wheels) Toilet Transfer Details (indicate cue type and reason): BSC placed over toilet with light Min A to guide RW when turning/manuevering in small spaces Toileting- Clothing Manipulation and Hygiene: Maximal assistance Toileting - Clothing Manipulation Details (indicate cue  type and reason): assist for hygiene in standing. pt assisting some with clothing mgmt     Functional mobility during ADLs:  Minimal assistance;Rolling walker (2 wheels);Cueing for safety General ADL Comments: Increased assist for completion with all ADLs, at baselnie requiring assist so anticipate pt is near functional baseline for ADLs     Vision Ability to See in Adequate Light: 0 Adequate Patient Visual Report: No change from baseline Vision Assessment?: No apparent visual deficits     Perception     Praxis      Pertinent Vitals/Pain Pain Assessment Pain Assessment: No/denies pain     Hand Dominance Right   Extremity/Trunk Assessment Upper Extremity Assessment Upper Extremity Assessment: Generalized weakness   Lower Extremity Assessment Lower Extremity Assessment: Defer to PT evaluation   Cervical / Trunk Assessment Cervical / Trunk Assessment: Kyphotic   Communication Communication Communication: No difficulties   Cognition Arousal/Alertness: Awake/alert, Lethargic Behavior During Therapy: Flat affect Overall Cognitive Status: History of cognitive impairments - at baseline                                 General Comments: lethargic requiring max cues to stay awake and initiate mobility, more alert once sitting and standing. h/o dementia and only oriented to self at baseline. follows simple commands with increased cues to complete task, needs repetition     General Comments  wife present and supportive. VSS on RA. assisted wife to call cafeteria and order pt lunch - advised that being placed on automatic trays may be helpful and wife in agreement - RN aware    Exercises     Shoulder Instructions      Home Living Family/patient expects to be discharged to:: [place]: Rolling Walker (2 wheels);Grab bars - toilet;BSC/3in1;Rollator (4 wheels)   Additional Comments: resident at [facility] with sitter 2 days/a week for ADL's bathing as needed, wife resident at ILF side      Prior  Functioning/Environment Prior Level of Function : Needs assist             Mobility Comments: wife reports pt typically ambulatory with RW requiring occasional cues and at most CGA for transers at baseline. Walks to hall and does activities. Walks outside with wife who lives in ILF ADLs Comments: Assist for all components of ADLs including bathing, dressing and medication from aide 2x/week or staff the remaining part of time.        OT Problem List: Impaired balance (sitting and/or standing);Decreased activity tolerance;Decreased strength;Decreased cognition;Decreased safety awareness      OT Treatment/Interventions:      OT Goals(Current goals can be found in the care plan section) Acute Rehab OT Goals Patient Stated Goal: wife hopeful he will discharge soon OT Goal Formulation: All assessment and education complete, DC therapy  OT Frequency:      Co-evaluation              AM-PAC OT "6 Clicks" Daily Activity     Outcome Measure Help from another person eating meals?: A Little Help from another person taking care of personal grooming?: A Little Help from another person toileting, which includes using toliet, bedpan, or urinal?: A Lot Help from another person bathing (including washing, rinsing, drying)?: A Lot  Help from another person to put on and taking off regular upper body clothing?: A Little Help from another person to put on and taking off regular lower body clothing?: A Lot 6 Click Score: 15   End of Session Equipment Utilized During Treatment: Rolling walker (2 wheels) Nurse Communication: Mobility status  Activity Tolerance: Patient tolerated treatment well Patient left: in bed;with call bell/phone within reach;with bed alarm set;with family/visitor present  OT Visit Diagnosis: Unsteadiness on feet (R26.81);Muscle weakness (generalized) (M62.81)                Time: 6712-4580 OT Time Calculation (min): 35 min Charges:  OT General Charges $OT Visit: 1  Visit OT Evaluation $OT Eval Moderate Complexity: 1 Mod OT Treatments $Self Care/Home Management : 8-22 mins  Malachy Chamber, OTR/L Acute Rehab Services Office: [phone]   Layla Maw 12/14/2021, 1:55 PM

## 2021-12-14 NOTE — Progress Notes (Signed)
ANTICOAGULATION CONSULT NOTE - Follow Up Consult  Pharmacy Consult for Warfarin Indication: atrial fibrillation  Allergies  Allergen Reactions   Penicillins Rash    Has patient had a PCN reaction causing immediate rash, facial/tongue/throat swelling, SOB or lightheadedness with hypotension: unknown Has patient had a PCN reaction causing severe rash involving mucus membranes or skin necrosis: unknown Has patient had a PCN reaction that required hospitalization : unknown Has patient had a PCN reaction occurring within the last 10 years: no If all of the above answers are "NO", then may proceed with Cephalosporin use.     Patient Measurements: Height: '6\' 7"'$  (200.7 cm) Weight: 112.8 kg (248 lb 11.2 oz) IBW/kg (Calculated) : 93.7  Vital Signs: Temp: 97.5 F (36.4 C) (06/10 0545) Temp Source: Axillary (06/10 0545) BP: 118/70 (06/10 0545) Pulse Rate: 74 (06/10 0545)  Labs: Recent Labs    12/11/21 2010 12/11/21 2344 12/12/21 0650 12/13/21 0702 12/14/21 0131  HGB 13.2  --  12.5* 13.8 12.6*  HCT 43.1  --  40.2 42.5 39.5  PLT 88*  --  PLATELET CLUMPS NOTED ON SMEAR, UNABLE TO ESTIMATE 88* 94*  LABPROT 25.3*  --  27.6* 29.4* 28.7*  INR 2.3*  --  2.6* 2.8* 2.7*  CREATININE 1.65*  --  1.67* 1.70* 1.75*  TROPONINIHS 109* 192* 238*  --   --     Estimated Creatinine Clearance: 42.6 mL/min (A) (by C-G formula based on SCr of 1.75 mg/dL (H)).   Medications:  Scheduled:   atorvastatin  20 mg Oral QPM   DULoxetine  20 mg Oral Daily   furosemide  40 mg Oral Daily   polyethylene glycol  17 g Oral Daily   Warfarin - Pharmacist Dosing Inpatient   Does not apply q1600    Assessment: 88 yo M admitted for CHF exacerbation. Pt on Warfarin PTA for hx atrial fibrillation. Pharmacy consulted for Warfarin dosing.   INR today is therapeutic at 2.7. Hgb 12.6, plt 94 - low stable. No signs/symptoms of bleeding noted.   PTA Warfarin regimen: 4 mg on MWF, 5 mg on all other days.   Goal of  Therapy:  INR 2-3 Monitor platelets by anticoagulation protocol: Yes   Plan:  Warfarin 5 mg PO x 1 tonight.  Daily INR. Monitor CBC and for signs/symptoms of bleeding.   Vance Peper, PharmD PGY1 Pharmacy Resident Phone [phone] 12/14/2021 10:21 AM   Please check AMION for all [place] phone numbers After 10:00 PM, call [place] [phone]

## 2021-12-14 NOTE — Evaluation (Signed)
Physical Therapy Evaluation Patient Details Name: Peter Singh MRN: [id number] DOB: Apr 12, 1932 Today's Date: 12/14/2021  History of Present Illness  Pt is an 88 y.o. male resident at [facility] admitted 12/11/21 with acute hypoxic respiratory failure secondary to CHF. CXR with small bilateral pleural effusions. Of note recently discharged home 12/10/21 after admissionf or CHF. Other PMH includes dementia, CHF, afib on Coumadin, CVA, CKD, BPH, urinary retention (chronic indwelling catheter).   Clinical Impression  Pt presents with an overall decrease in functional mobility secondary to above. PTA, pt resident at SNF, ambulatory with RW, has sitters who assist with mobility/ADLs as needed. Today, pt requiring up to [facility] for mobility, significant fatigue this session. Pt's wife present and supportive, requesting pt get PT/OT upon return to SNF. Pt would benefit from continued acute PT services to maximize functional mobility and independence prior to d/c with SNF-level therapies.      Recommendations for follow up therapy are one component of a multi-disciplinary discharge planning process, led by the attending physician.  Recommendations may be updated based on patient status, additional functional criteria and insurance authorization.  Follow Up Recommendations Skilled nursing-short term rehab (<3 hours/day)    Assistance Recommended at Discharge Frequent or constant Supervision/Assistance  Patient can return home with the following  A lot of help with walking and/or transfers;A little help with bathing/dressing/bathroom;Direct supervision/assist for financial management;Direct supervision/assist for medications management;Assistance with cooking/housework;Assist for transportation    Equipment Recommendations None recommended by PT  Recommendations for Other Services       Functional Status Assessment Patient has had a recent decline in their functional status and demonstrates the  ability to make significant improvements in function in a reasonable and predictable amount of time.     Precautions / Restrictions Precautions Precautions: Fall;Other (comment) Precaution Comments: chronic suprapubic cath Restrictions Weight Bearing Restrictions: No      Mobility  Bed Mobility Overal bed mobility: Needs Assistance Bed Mobility: Supine to Sit     Supine to sit: Mod assist     General bed mobility comments: pt lethargic with little movement initiation, requiring heavy modA for BLE management and HHA to elevate trunk, more alert sitting EOB    Transfers Overall transfer level: Needs assistance Equipment used: Rolling walker (2 wheels) Transfers: Sit to/from Stand Sit to Stand: Min assist, From elevated surface           General transfer comment: increased time and effort with cues for sequencing, minA for trunk elevation standing from EOB to RW; cues for upright posture. pt with good hand placement when going to sit, but poor eccentric control into low chair height    Ambulation/Gait Ambulation/Gait assistance: Min guard Gait Distance (Feet): 16 Feet Assistive device: Rolling walker (2 wheels) Gait Pattern/deviations: Step-through pattern, Decreased stride length, Trunk flexed Gait velocity: Decreased     General Gait Details: Slow, mildly unsteady gait with decreased step lengths bilaterally. Cues for upright posture and sequencing. No SOB noted; pt fatigued  Stairs            Wheelchair Mobility    Modified Rankin (Stroke Patients Only)       Balance Overall balance assessment: Needs assistance Sitting-balance support: Feet supported, Bilateral upper extremity supported, No upper extremity supported Sitting balance-Leahy Scale: Fair     Standing balance support: During functional activity, Reliant on assistive device for balance Standing balance-Leahy Scale: Poor  Pertinent Vitals/Pain       Home Living Family/patient expects to be discharged to:: [place]: Conservation officer, nature (2 wheels);Grab bars - toilet;BSC/3in1;Rollator (4 wheels) Additional Comments: resident at [facility] with sitter 2 days/a week for ADL's bathing as needed, wife resident at ILF side    Prior Function               Mobility Comments: wife reports pt typically ambulatory with RW requiring occasional cues and at most CGA for transers at baseline. Walks to hall and does activities. Walks outside with wife who lives in ILF ADLs Comments: Assist for all components of ADLs including bathing, dressing and medication from aide 2x/week or staff the remaining part of time.     Hand Dominance   Dominant Hand: Right    Extremity/Trunk Assessment   Upper Extremity Assessment Upper Extremity Assessment: Generalized weakness    Lower Extremity Assessment Lower Extremity Assessment: Generalized weakness    Cervical / Trunk Assessment Cervical / Trunk Assessment: Kyphotic  Communication   Communication: No difficulties  Cognition Arousal/Alertness: Lethargic, Awake/alert Behavior During Therapy: Flat affect                                   General Comments: lethargic requiring max cues to stay awake and initiate mobility, more alert once sitting and standing. h/o dementia and only oriented to self at baseline. follows simple commands with increased cues to complete task, needs repetition        General Comments General comments (skin integrity, edema, etc.): wife present and supportive; VSS on RA    Exercises     Assessment/Plan    PT Assessment Patient needs continued PT services  PT Problem List Decreased strength;Decreased mobility;Decreased safety awareness;Decreased cognition;Decreased balance;Decreased activity tolerance;Cardiopulmonary status limiting activity       PT Treatment Interventions Therapeutic  activities;Gait training;Balance training;Therapeutic exercise;Patient/family education;DME instruction;Functional mobility training    PT Goals (Current goals can be found in the Care Plan section)  Acute Rehab PT Goals Patient Stated Goal: get rehab at home (SNF) PT Goal Formulation: With patient/family Time For Goal Achievement: 12/28/21 Potential to Achieve Goals: Good    Frequency Min 2X/week     Co-evaluation               AM-PAC PT "6 Clicks" Mobility  Outcome Measure Help needed turning from your back to your side while in a flat bed without using bedrails?: A Lot Help needed moving from lying on your back to sitting on the side of a flat bed without using bedrails?: A Lot Help needed moving to and from a bed to a chair (including a wheelchair)?: A Little Help needed standing up from a chair using your arms (e.g., wheelchair or bedside chair)?: A Little Help needed to walk in hospital room?: Total Help needed climbing 3-5 steps with a railing? : Total 6 Click Score: 12    End of Session Equipment Utilized During Treatment: Gait belt Activity Tolerance: Patient tolerated treatment well;Patient limited by fatigue Patient left: in chair;with call bell/phone within reach;with chair alarm set Nurse Communication: Mobility status PT Visit Diagnosis: Muscle weakness (generalized) (M62.81);Unsteadiness on feet (R26.81);Difficulty in walking, not elsewhere classified (R26.2)    Time: 2440-1027 PT Time Calculation (min) (ACUTE ONLY): 24 min   Charges:   PT  Evaluation $PT Eval Moderate Complexity: 1 Mod        Mabeline Caras, PT, DPT Acute Rehabilitation Services  Pager [phone] Office [place] 12/14/2021, 1:14 PM

## 2021-12-14 NOTE — Progress Notes (Signed)
PROGRESS NOTE    Peter Singh  ASN:[id number] DOB: 12/04/1931 DOA: 12/11/2021 PCP: [name], MD  89/M from [facility] SNF with history of dementia, chronic diastolic CHF, chronic A-fib on Coumadin, renal mass, history of CVA, CKD stage II, BPH, h/o urinary retention with chronic indwelling suprapubic catheter just discharged from [facility] 6/6 after admission for decompensated CHF and elevated troponin, followed by cardiology, briefly diuresed and then discharged home on Lasix 20 Mg 3 times a week, also treated for UTI. -Brought back to the ED 6/7 with acute onset hypoxia and shortness of breath, patient is unable to provide any meaningful history -Labs in the ED noted white count of 5.1, hemoglobin 13, platelets 88 K stable, sodium 137, creatinine 1.6, T. bili 1.9, troponin 109-192, INR 2.3, BNP 233, chest x-ray with small bilateral effusions   Subjective: Feels okay, breathing is improving, denies any new complaints, wants to sleep longer, will eat breakfast later   Assessment and Plan:  Acute hypoxic respiratory failure Acute on chronic diastolic CHF -secondary to diastolic CHF, do not suspect infectious process anymore, repeat chest x-ray with scant bilateral effusions -2D echo 6/5 with severe LVH, grade 3 diastolic dysfunction, preserved EF, moderate PAH and decreased RV function -Continue IV Lasix today, urine output remains inaccurate, -2.1 L yesterday, mild uptrend in creatinine, will cut down further IV Lasix today and transition to p.o. -Due to advanced age, frailty and dementia he is not a candidate for aggressive work-up, GDMT limited by CKD -Weaned off O2 -Discussed with wife regarding need for palliative care involvement, will send referral at discharge -Back to SNF tomorrow if stable   Low-grade fever -100.3 on 6/8 early am, isolated temp -Remains afebrile since the isolated low-grade temp on 6/8, no leukocytosis, clinically improving and nontoxic, monitor  clinically -Chronic Foley, UA abnormal as expected, per spouse this is changed diligently every month at [facility], urine culture with 60,000 colonies of yeast, this is a colonizer, recommend catheter change   Elevated troponin -Troponin from 100s to 200 range now, clinically do not suspect ACS, during his recent admission last week it peaked at 319, seen by cards last week, recommended medical management -2D echo on 6/5 with severe LVH, grade 3 DD, preserved EF, moderate PAH and decreased RV function   AKI on CKD 2 -Mild baseline creatinine around 1.2, renal component as well, mild bump with diuresis however remains volume overloaded, given IV Lasix this morning, changed to p.o. now, check BMP in a.m.   Chronic A-fib -Heart rate controlled, INR therapeutic, continue Coumadin    BPH, chronic urinary retention -With suprapubic catheter, reportedly completed ABX for UTI 6/7 -Discussed catheter with wife, she reports this is changed every 30 days without fail at [facility]   Dementia -Oriented to self only, delirium precautions   DVT prophylaxis: Coumadin Code Status: DNR Family Communication: Wife at bedside yesterday Disposition Plan: Back to [facility] SNF tomorrow if stable  Consultants:    Procedures:   Antimicrobials:    Objective: Vitals:   12/13/21 0907 12/13/21 1311 12/13/21 2157 12/14/21 0545  BP: 137/61 138/76 134/69 118/70  Pulse: 87 95 66 74  Resp: '18 18 19 20  '$ Temp: 97.8 F (36.6 C) 97.9 F (36.6 C) (!) 97.5 F (36.4 C) (!) 97.5 F (36.4 C)  TempSrc: Oral Oral Oral Axillary  SpO2: 94% 94% 94% 94%  Weight:    112.8 kg  Height:        Intake/Output Summary (Last 24 hours) at 12/14/2021 1201  Last data filed at 12/14/2021 0546 Gross per 24 hour  Intake 240 ml  Output 1225 ml  Net -985 ml   Filed Weights   12/11/21 2012 12/14/21 0545  Weight: 117.1 kg 112.8 kg    Examination:  General exam: Elderly male chronically ill laying in bed eyes closed,  awakens to verbal stimuli, alert to self only, moderate cognitive deficits HEENT: Positive JVD CVS: S1-S2, regular rhythm Lungs: Rare basilar rales otherwise clear Abdomen: Soft, nontender, bowel sounds present Extremities: Trace edema  Psychiatry: Poor insight and judgment    Data Reviewed:   CBC: Recent Labs  Lab 12/08/21 1904 12/09/21 0427 12/11/21 2010 12/12/21 0650 12/13/21 0702 12/14/21 0131  WBC 4.7 3.5* 5.1 4.2 4.0 4.1  NEUTROABS 3.8  --  4.1  --   --   --   HGB 13.1 12.3* 13.2 12.5* 13.8 12.6*  HCT 41.1 38.4* 43.1 40.2 42.5 39.5  MCV 88.4 88.3 90.5 88.2 88.4 86.4  PLT 85* 85* 88* PLATELET CLUMPS NOTED ON SMEAR, UNABLE TO ESTIMATE 88* 94*   Basic Metabolic Panel: Recent Labs  Lab 12/10/21 0417 12/11/21 2010 12/12/21 0650 12/13/21 0702 12/14/21 0131  NA 138 137 138 137 138  K 4.1 4.2 4.0 4.0 3.5  CL 106 103 104 100 104  CO2 '23 22 25 24 26  '$ GLUCOSE 112* 135* 124* 114* 132*  BUN 19 36* 37* 45* 50*  CREATININE 1.40* 1.65* 1.67* 1.70* 1.75*  CALCIUM 9.9 9.7 9.7 10.1 10.1   GFR: Estimated Creatinine Clearance: 42.6 mL/min (A) (by C-G formula based on SCr of 1.75 mg/dL (H)). Liver Function Tests: Recent Labs  Lab 12/08/21 1904 12/11/21 2010  AST 14* 16  ALT 8 8  ALKPHOS 101 82  BILITOT 1.6* 1.9*  PROT 6.4* 6.2*  ALBUMIN 3.6 3.4*   No results for input(s): "LIPASE", "AMYLASE" in the last 168 hours. No results for input(s): "AMMONIA" in the last 168 hours. Coagulation Profile: Recent Labs  Lab 12/10/21 0417 12/11/21 2010 12/12/21 0650 12/13/21 0702 12/14/21 0131  INR 2.3* 2.3* 2.6* 2.8* 2.7*   Cardiac Enzymes: No results for input(s): "CKTOTAL", "CKMB", "CKMBINDEX", "TROPONINI" in the last 168 hours. BNP (last 3 results) No results for input(s): "PROBNP" in the last 8760 hours. HbA1C: No results for input(s): "HGBA1C" in the last 72 hours. CBG: No results for input(s): "GLUCAP" in the last 168 hours. Lipid Profile: No results for  input(s): "CHOL", "HDL", "LDLCALC", "TRIG", "CHOLHDL", "LDLDIRECT" in the last 72 hours. Thyroid Function Tests: No results for input(s): "TSH", "T4TOTAL", "FREET4", "T3FREE", "THYROIDAB" in the last 72 hours. Anemia Panel: No results for input(s): "VITAMINB12", "FOLATE", "FERRITIN", "TIBC", "IRON", "RETICCTPCT" in the last 72 hours. Urine analysis:    Component Value Date/Time   COLORURINE AMBER (A) 12/12/2021 0846   APPEARANCEUR CLOUDY (A) 12/12/2021 0846   LABSPEC 1.015 12/12/2021 0846   PHURINE 5.0 12/12/2021 0846   GLUCOSEU NEGATIVE 12/12/2021 0846   HGBUR MODERATE (A) 12/12/2021 0846   BILIRUBINUR NEGATIVE 12/12/2021 0846   KETONESUR NEGATIVE 12/12/2021 0846   PROTEINUR 100 (A) 12/12/2021 0846   NITRITE NEGATIVE 12/12/2021 0846   LEUKOCYTESUR MODERATE (A) 12/12/2021 0846   Sepsis Labs: '@LABRCNTIP'$ (procalcitonin:4,lacticidven:4)  ) Recent Results (from the past 240 hour(s))  SARS Coronavirus 2 by RT PCR (hospital order, performed in [facility] hospital lab) *cepheid single result test* Anterior Nasal Swab     Status: None   Collection Time: 12/08/21  7:04 PM   Specimen: Anterior Nasal Swab  Result Value Ref  Range Status   SARS Coronavirus 2 by RT PCR NEGATIVE NEGATIVE Final    Comment: (NOTE) SARS-CoV-2 target nucleic acids are NOT DETECTED.  The SARS-CoV-2 RNA is generally detectable in upper and lower respiratory specimens during the acute phase of infection. The lowest concentration of SARS-CoV-2 viral copies this assay can detect is 250 copies / mL. A negative result does not preclude SARS-CoV-2 infection and should not be used as the sole basis for treatment or other patient management decisions.  A negative result may occur with improper specimen collection / handling, submission of specimen other than nasopharyngeal swab, presence of viral mutation(s) within the areas targeted by this assay, and inadequate number of viral copies (<250 copies / mL). A negative  result must be combined with clinical observations, patient history, and epidemiological information.  Fact Sheet for Patients:   https://www.patel.info/  Fact Sheet for Healthcare Providers: https://hall.com/  This test is not yet approved or  cleared by the [place] FDA and has been authorized for detection and/or diagnosis of SARS-CoV-2 by FDA under an Emergency Use Authorization (EUA).  This EUA will remain in effect (meaning this test can be used) for the duration of the COVID-19 declaration under Section 564(b)(1) of the Act, 21 U.S.C. section 360bbb-3(b)(1), unless the authorization is terminated or revoked sooner.  Performed at [facility], [place] [address] [place], [place] [address]   Urine Culture     Status: Abnormal   Collection Time: 12/12/21  8:22 AM   Specimen: Urine, Catheterized  Result Value Ref Range Status   Specimen Description URINE, CATHETERIZED  Final   Special Requests   Final    NONE Performed at [facility], [address] [place], [place] [address]    Culture 60,000 COLONIES/mL YEAST (A)  Final   Report Status 12/13/2021 FINAL  Final     Radiology Studies: No results found.   Scheduled Meds:  atorvastatin  20 mg Oral QPM   DULoxetine  20 mg Oral Daily   furosemide  40 mg Oral Daily   polyethylene glycol  17 g Oral Daily   warfarin  5 mg Oral ONCE-1600   Warfarin - Pharmacist Dosing Inpatient   Does not apply q1600   Continuous Infusions:   LOS: 2 days    Time spent: 36mn  PDomenic Polite MD [facility]   12/14/2021, 12:01 PM

## 2021-12-15 DIAGNOSIS — J9601 Acute respiratory failure with hypoxia: Secondary | ICD-10-CM | POA: Diagnosis not present

## 2021-12-15 LAB — PROTIME-INR
INR: 4.1 (ref 0.8–1.2)
INR: 4.3 (ref 0.8–1.2)
Prothrombin Time: 39.1 seconds — ABNORMAL HIGH (ref 11.4–15.2)
Prothrombin Time: 41 seconds — ABNORMAL HIGH (ref 11.4–15.2)

## 2021-12-15 LAB — CBC
HCT: 40.5 % (ref 39.0–52.0)
Hemoglobin: 12.7 g/dL — ABNORMAL LOW (ref 13.0–17.0)
MCH: 27.3 pg (ref 26.0–34.0)
MCHC: 31.4 g/dL (ref 30.0–36.0)
MCV: 86.9 fL (ref 80.0–100.0)
Platelets: 95 10*3/uL — ABNORMAL LOW (ref 150–400)
RBC: 4.66 MIL/uL (ref 4.22–5.81)
RDW: 17.2 % — ABNORMAL HIGH (ref 11.5–15.5)
WBC: 4 10*3/uL (ref 4.0–10.5)
nRBC: 0 % (ref 0.0–0.2)

## 2021-12-15 LAB — BASIC METABOLIC PANEL
Anion gap: 11 (ref 5–15)
BUN: 57 mg/dL — ABNORMAL HIGH (ref 8–23)
CO2: 26 mmol/L (ref 22–32)
Calcium: 10.4 mg/dL — ABNORMAL HIGH (ref 8.9–10.3)
Chloride: 105 mmol/L (ref 98–111)
Creatinine, Ser: 1.72 mg/dL — ABNORMAL HIGH (ref 0.61–1.24)
GFR, Estimated: 38 mL/min — ABNORMAL LOW (ref >60)
Glucose, Bld: 129 mg/dL — ABNORMAL HIGH (ref 70–99)
Potassium: 3.5 mmol/L (ref 3.5–5.1)
Sodium: 142 mmol/L (ref 135–145)

## 2021-12-15 MED ORDER — WARFARIN SODIUM 5 MG PO TABS: 5.0000 mg | ORAL_TABLET | ORAL | Status: DC

## 2021-12-15 MED ORDER — WARFARIN SODIUM 4 MG PO TABS: 4.0000 mg | ORAL_TABLET | ORAL | Status: DC

## 2021-12-15 MED ORDER — FUROSEMIDE 20 MG PO TABS: 40.0000 mg | ORAL_TABLET | Freq: Every day | ORAL | Status: DC

## 2021-12-15 NOTE — TOC Initial Note (Signed)
Transition of Care [facility]) - Initial/Assessment Note    Patient Details  Name: Peter Singh MRN: [id number] Date of Birth: 30-Jun-1932  Transition of Care [facility]) CM/SW Contact:    Geralynn Ochs, LCSW Phone Number: 12/15/2021, 9:48 AM  Clinical Narrative:             Patient from [facility], can return at discharge when medically stable. CSW completed referral information and sent to [facility]. CSW to follow.      Expected Discharge Plan: Skilled Nursing Facility Barriers to Discharge: Continued Medical Work up   Patient Goals and CMS Choice Patient states their goals for this hospitalization and ongoing recovery are:: patient unable to participate in goal setting, not oriented CMS Medicare.gov Compare Post Acute Care list provided to:: Patient Represenative (must comment) Choice offered to / list presented to : Spouse  Expected Discharge Plan and Services Expected Discharge Plan: [place] Acute Care Choice: [place] Living arrangements for the past 2 months: [place]                                      Prior Living Arrangements/Services Living arrangements for the past 2 months: [place] Lives with:: Facility Resident Patient language and need for interpreter reviewed:: No Do you feel safe going back to the place where you live?: Yes      Need for Family Participation in Patient Care: Yes (Comment) Care giver support system in place?: Yes (comment)   Criminal Activity/Legal Involvement Pertinent to Current Situation/Hospitalization: No - Comment as needed  Activities of Daily Living Home Assistive Devices/Equipment: Gilford Rile (specify type) ADL Screening (condition at time of admission) Patient's cognitive ability adequate to safely complete daily activities?: Yes Is the patient deaf or have difficulty hearing?: No Does the patient have difficulty seeing, even when wearing  glasses/contacts?: No Does the patient have difficulty concentrating, remembering, or making decisions?: Yes Patient able to express need for assistance with ADLs?: Yes Does the patient have difficulty dressing or bathing?: No Independently performs ADLs?: No Communication: Independent Dressing (OT): Needs assistance Is this a change from baseline?: Pre-admission baseline Grooming: Needs assistance Is this a change from baseline?: Pre-admission baseline Feeding: Independent with device (comment) Bathing: Needs assistance Is this a change from baseline?: Pre-admission baseline Toileting: Needs assistance Is this a change from baseline?: Pre-admission baseline In/Out Bed: Needs assistance Is this a change from baseline?: Pre-admission baseline Walks in Home: Independent with device (comment) Does the patient have difficulty walking or climbing stairs?: Yes Weakness of Legs: Both Weakness of Arms/Hands: Both  Permission Sought/Granted Permission sought to share information with : Facility Sport and exercise psychologist, Family Supports Permission granted to share information with : Yes, Verbal Permission Granted  Share Information with NAME: Peter Singh  Permission granted to share info w AGENCY: [facility]  Permission granted to share info w Relationship: Spouse     Emotional Assessment   Attitude/Demeanor/Rapport: Unable to Assess Affect (typically observed): Unable to Assess Orientation: : Oriented to Self Alcohol / Substance Use: Not Applicable Psych Involvement: No (comment)  Admission diagnosis:  Shortness of breath [R06.02] Acute respiratory failure with hypoxia (HCC) [J96.01] Severe comorbid illness [R69] Acute on chronic congestive heart failure, unspecified heart failure type [facility]) [I50.9] Patient Active Problem List   Diagnosis Date Noted   NSTEMI (non-ST elevated myocardial infarction) ([place]) 12/09/2021   Acute  respiratory failure with hypoxia ([place]) 12/08/2021   UTI  (urinary tract infection) 12/08/2021   Cyanosis of fingertip 11/28/2021   Slow transit constipation 04/20/2020   MRSA (methicillin resistant staph aureus) culture positive 11/17/2019   Suprapubic catheter ([place]) 10/30/2019   Peripheral vascular disease ([place]) 09/02/2019   Knee pain 06/24/2019   Vascular dementia without behavioral disturbance ([place]) 05/07/2019   Other neutropenia ([place]) 11/16/2018   Chronic diastolic heart failure ([place]) 06/08/2018   Encounter for therapeutic drug monitoring 03/19/2017   Urinary retention due to benign prostatic hyperplasia 10/14/2016   Renal mass, right 10/14/2016   Dementia without behavioral disturbance ([place]) 10/14/2016   Thrombocytopenia ([place]) 10/07/2016   Hereditary and idiopathic peripheral neuropathy 07/06/2014   Essential hypertension 03/24/2014   Hyperlipidemia 03/24/2014   Chronic anticoagulation 03/24/2014   Atrial fibrillation ([place]) 05/17/2013   PCP:  [name], MD Pharmacy:   [place], [place] [address] Phone: [phone] Fax: [phone]     Social Determinants of Health (SDOH) Interventions    Readmission Risk Interventions     No data to display

## 2021-12-15 NOTE — Progress Notes (Signed)
PROGRESS NOTE    Peter Singh  XVQ:[id number] DOB: Nov 30, 1931 DOA: 12/11/2021 PCP: [name], MD  89/M from [facility] SNF with history of dementia, chronic diastolic CHF, chronic A-fib on Coumadin, renal mass, history of CVA, CKD stage II, BPH, h/o urinary retention with chronic indwelling suprapubic catheter just discharged from [facility] 6/6 after admission for decompensated CHF and elevated troponin, followed by cardiology, briefly diuresed and then discharged home on Lasix 20 Mg 3 times a week, also treated for UTI. -Brought back to the ED 6/7 with acute onset hypoxia and shortness of breath, patient is unable to provide any meaningful history -Labs in the ED noted white count of 5.1, hemoglobin 13, platelets 88 K stable, sodium 137, creatinine 1.6, T. bili 1.9, troponin 109-192, INR 2.3, BNP 233, chest x-ray with small bilateral effusions -Improving on diuretics, weaned off O2  Subjective: Per staff sleeping most of the day,  Assessment and Plan:  Acute hypoxic respiratory failure Acute on chronic diastolic CHF -secondary to diastolic CHF, do not suspect infectious process anymore, repeat chest x-ray with scant bilateral effusions -2D echo 6/5 with severe LVH, grade 3 diastolic dysfunction, preserved EF, moderate PAH and decreased RV function -Diuresed with IV Lasix he is 3.2 L negative, creatinine 1.7, transition to oral Lasix-40 Mg daily, he is on 20 Mg every other day at baseline -Due to advanced age, frailty and dementia he is not a candidate for aggressive work-up, GDMT limited by CKD -Weaned off O2 -Discussed with wife 6/9 regarding need for palliative care involvement, will send referral at discharge -Discharge planning   Low-grade fever -100.3 on 6/8 early am, isolated temp -Remains afebrile since the isolated low-grade temp on 6/8, no leukocytosis, clinically improving and nontoxic, monitor clinically -Chronic suprapubic catheter, UA abnormal as expected, per spouse this  is changed diligently every month at [facility], urine culture with 60,000 colonies of yeast, this is a colonizer, suprapubic catheter changed 6/10   Elevated troponin -Troponin from 100s to 200 range now, clinically do not suspect ACS, during his recent admission last week it peaked at 319, seen by cards last week, recommended medical management -2D echo on 6/5 with severe LVH, grade 3 DD, preserved EF, moderate PAH and decreased RV function   AKI on CKD 2 -Mild baseline creatinine around 1.2, renal component as well, mild bump with diuresis -Now stable at 1.7, continue Lasix 40 Mg daily   Chronic A-fib -Heart rate controlled, INR therapeutic, continue Coumadin    BPH, chronic urinary retention -With suprapubic catheter, reportedly completed ABX for UTI 6/7 -Discussed catheter with wife, she reports this is changed every 30 days without fail at [facility], catheter changed 6/10   Dementia -Oriented to self only, delirium precautions   DVT prophylaxis: Coumadin Code Status: DNR Family Communication: Wife at bedside 6/9, attempted to reach wife without success today Disposition Plan: Back to [facility] SNF tomorrow if stable  Consultants:    Procedures:   Antimicrobials:    Objective: Vitals:   12/14/21 0545 12/14/21 1325 12/14/21 2025 12/15/21 0435  BP: 118/70 (!) 150/69 133/70 118/71  Pulse: 74  70 61  Resp: '20 20 20 20  '$ Temp: (!) 97.5 F (36.4 C) (!) 96.7 F (35.9 C) (!) 96.7 F (35.9 C) (!) 95.7 F (35.4 C)  TempSrc: Axillary Axillary Axillary Axillary  SpO2: 94%  100% 97%  Weight: 112.8 kg   109.8 kg  Height:        Intake/Output Summary (Last 24 hours) at 12/15/2021 [phone]  Last data filed at 12/15/2021 0447 Gross per 24 hour  Intake 240 ml  Output 1250 ml  Net -1010 ml   Filed Weights   12/11/21 2012 12/14/21 0545 12/15/21 0435  Weight: 117.1 kg 112.8 kg 109.8 kg    Examination:  General exam: Elderly chronically ill male laying in bed, oriented to  self only, sleeping awakens to verbal stimuli, moderate cognitive deficits HEENT: No JVD CVS: S1-S2, regular rhythm Lungs: Poor air movement otherwise clear Abdomen: Soft, nontender, bowel sounds present Extremities: Trace edema Psychiatry: Poor insight and judgment    Data Reviewed:   CBC: Recent Labs  Lab 12/08/21 1904 12/09/21 0427 12/11/21 2010 12/12/21 0650 12/13/21 0702 12/14/21 0131 12/15/21 0035  WBC 4.7   < > 5.1 4.2 4.0 4.1 4.0  NEUTROABS 3.8  --  4.1  --   --   --   --   HGB 13.1   < > 13.2 12.5* 13.8 12.6* 12.7*  HCT 41.1   < > 43.1 40.2 42.5 39.5 40.5  MCV 88.4   < > 90.5 88.2 88.4 86.4 86.9  PLT 85*   < > 88* PLATELET CLUMPS NOTED ON SMEAR, UNABLE TO ESTIMATE 88* 94* 95*   < > = values in this interval not displayed.   Basic Metabolic Panel: Recent Labs  Lab 12/11/21 2010 12/12/21 0650 12/13/21 0702 12/14/21 0131 12/15/21 0035  NA 137 138 137 138 142  K 4.2 4.0 4.0 3.5 3.5  CL 103 104 100 104 105  CO2 '22 25 24 26 26  '$ GLUCOSE 135* 124* 114* 132* 129*  BUN 36* 37* 45* 50* 57*  CREATININE 1.65* 1.67* 1.70* 1.75* 1.72*  CALCIUM 9.7 9.7 10.1 10.1 10.4*   GFR: Estimated Creatinine Clearance: 40.1 mL/min (A) (by C-G formula based on SCr of 1.72 mg/dL (H)). Liver Function Tests: Recent Labs  Lab 12/08/21 1904 12/11/21 2010  AST 14* 16  ALT 8 8  ALKPHOS 101 82  BILITOT 1.6* 1.9*  PROT 6.4* 6.2*  ALBUMIN 3.6 3.4*   No results for input(s): "LIPASE", "AMYLASE" in the last 168 hours. No results for input(s): "AMMONIA" in the last 168 hours. Coagulation Profile: Recent Labs  Lab 12/12/21 0650 12/13/21 0702 12/14/21 0131 12/15/21 0035 12/15/21 0753  INR 2.6* 2.8* 2.7* 4.1* 4.3*   Cardiac Enzymes: No results for input(s): "CKTOTAL", "CKMB", "CKMBINDEX", "TROPONINI" in the last 168 hours. BNP (last 3 results) No results for input(s): "PROBNP" in the last 8760 hours. HbA1C: No results for input(s): "HGBA1C" in the last 72 hours. CBG: No  results for input(s): "GLUCAP" in the last 168 hours. Lipid Profile: No results for input(s): "CHOL", "HDL", "LDLCALC", "TRIG", "CHOLHDL", "LDLDIRECT" in the last 72 hours. Thyroid Function Tests: No results for input(s): "TSH", "T4TOTAL", "FREET4", "T3FREE", "THYROIDAB" in the last 72 hours. Anemia Panel: No results for input(s): "VITAMINB12", "FOLATE", "FERRITIN", "TIBC", "IRON", "RETICCTPCT" in the last 72 hours. Urine analysis:    Component Value Date/Time   COLORURINE AMBER (A) 12/12/2021 0846   APPEARANCEUR CLOUDY (A) 12/12/2021 0846   LABSPEC 1.015 12/12/2021 0846   PHURINE 5.0 12/12/2021 0846   GLUCOSEU NEGATIVE 12/12/2021 0846   HGBUR MODERATE (A) 12/12/2021 0846   BILIRUBINUR NEGATIVE 12/12/2021 0846   KETONESUR NEGATIVE 12/12/2021 0846   PROTEINUR 100 (A) 12/12/2021 0846   NITRITE NEGATIVE 12/12/2021 0846   LEUKOCYTESUR MODERATE (A) 12/12/2021 0846   Sepsis Labs: '@LABRCNTIP'$ (procalcitonin:4,lacticidven:4)  ) Recent Results (from the past 240 hour(s))  SARS Coronavirus 2 by RT PCR (hospital  order, performed in [facility] hospital lab) *cepheid single result test* Anterior Nasal Swab     Status: None   Collection Time: 12/08/21  7:04 PM   Specimen: Anterior Nasal Swab  Result Value Ref Range Status   SARS Coronavirus 2 by RT PCR NEGATIVE NEGATIVE Final    Comment: (NOTE) SARS-CoV-2 target nucleic acids are NOT DETECTED.  The SARS-CoV-2 RNA is generally detectable in upper and lower respiratory specimens during the acute phase of infection. The lowest concentration of SARS-CoV-2 viral copies this assay can detect is 250 copies / mL. A negative result does not preclude SARS-CoV-2 infection and should not be used as the sole basis for treatment or other patient management decisions.  A negative result may occur with improper specimen collection / handling, submission of specimen other than nasopharyngeal swab, presence of viral mutation(s) within the areas targeted by  this assay, and inadequate number of viral copies (<250 copies / mL). A negative result must be combined with clinical observations, patient history, and epidemiological information.  Fact Sheet for Patients:   https://www.patel.info/  Fact Sheet for Healthcare Providers: https://hall.com/  This test is not yet approved or  cleared by the [place] FDA and has been authorized for detection and/or diagnosis of SARS-CoV-2 by FDA under an Emergency Use Authorization (EUA).  This EUA will remain in effect (meaning this test can be used) for the duration of the COVID-19 declaration under Section 564(b)(1) of the Act, 21 U.S.C. section 360bbb-3(b)(1), unless the authorization is terminated or revoked sooner.  Performed at [facility], [place] [address], [place], [place] [address]   Urine Culture     Status: Abnormal   Collection Time: 12/12/21  8:22 AM   Specimen: Urine, Catheterized  Result Value Ref Range Status   Specimen Description URINE, CATHETERIZED  Final   Special Requests   Final    NONE Performed at [facility], [address] [place], [place] [address]    Culture 60,000 COLONIES/mL YEAST (A)  Final   Report Status 12/13/2021 FINAL  Final     Radiology Studies: No results found.   Scheduled Meds:  atorvastatin  20 mg Oral QPM   DULoxetine  20 mg Oral Daily   furosemide  40 mg Oral Daily   polyethylene glycol  17 g Oral Daily   Warfarin - Pharmacist Dosing Inpatient   Does not apply q1600   Continuous Infusions:   LOS: 3 days    Time spent: 45mn  PDomenic Polite MD [facility]   12/15/2021, 9:48 AM

## 2021-12-15 NOTE — Progress Notes (Signed)
Informed by CCMD of 7 bts of VT ~ midnight; pt sleeping. Will continue to monitor.

## 2021-12-15 NOTE — Discharge Summary (Incomplete)
Physician Discharge Summary  Peter Singh IEP:[id number] DOB: [birth year] DOA: 12/11/2021  PCP: [name], MD  Admit date: 12/11/2021 Discharge date: 12/16/2021  Time spent: 35 minutes  Recommendations for Outpatient Follow-up:  Back to SNF for long-term care Recommend palliative care follow-up at SNF [facility] in 1 week Routine suprapubic catheter care, to be changed every 30 days, last changed in the hospital 6/10 INR supratherapeutic at discharge, hold Coumadin for 2 days, recheck INR 6/14 and if therapeutic can resume as per previous regimen of 4 mg alternating with 5 Mg daily Please monitor daily weights as best possible, recommend additional 20 Mg Lasix for weight gain, 2-3 pounds in 1day or 5 pounds in 1 week   Discharge Diagnoses:  Principal Problem:   Acute respiratory failure with hypoxia (Peter Singh) Acute on chronic diastolic CHF Severe LVH, grade 3 diastolic dysfunction Moderate PAH AKI on CKD 2 Chronic A-fib on anticoagulation BPH, chronic urinary retention with suprapubic catheter   Atrial fibrillation (HCC)   Essential hypertension   Hyperlipidemia   Chronic anticoagulation   Dementia without behavioral disturbance (HCC)   Chronic diastolic heart failure (Peter Singh) DNR  Discharge Condition: Stable  Diet recommendation: Low-sodium    History of present illness:  88/M from [facility] with history of dementia, chronic diastolic CHF, chronic A-fib on Coumadin, renal mass, history of CVA, CKD stage II, BPH, h/o urinary retention with chronic indwelling suprapubic catheter just discharged from [facility] 6/6 after admission for decompensated CHF and elevated troponin, followed by cardiology, briefly diuresed and then discharged home on Lasix 20 Mg 3 times a week, also treated for UTI. -Brought back to the ED 6/7 with acute onset hypoxia and shortness of breath, patient is unable to provide any meaningful history -Labs in the ED noted white count of 5.1, hemoglobin 13, platelets 88  K stable, sodium 137, creatinine 1.6, T. bili 1.9, troponin 109-192, INR 2.3, BNP 233, chest x-ray with small bilateral effusions  Hospital Course:   Acute hypoxic respiratory failure Acute on chronic diastolic CHF -secondary to diastolic CHF, do not suspect infectious process anymore, repeat chest x-ray with scant bilateral effusions -2D echo 6/5 with severe LVH, grade 3 diastolic dysfunction, preserved EF, moderate PAH and decreased RV function -Diuresed with IV Lasix he is 4.7 L negative, creatinine 1.7, transitioned to oral Lasix-40 Mg daily, he was on 20 Mg every other day at baseline, advised to take an additional 20 Mg daily as needed for weight gain -Due to advanced age, frailty and dementia he is not a candidate for aggressive work-up, GDMT limited by CKD -Weaned off O2 -Discussed with wife 6/9 regarding need for palliative care involvement, will send referral at discharge -Discharge to SNF for short-term rehab   Low-grade fever -100.3 on 6/8 early am, isolated temp -Remains afebrile since the isolated low-grade temp on 6/8, no leukocytosis, clinically improving and nontoxic -Chronic suprapubic catheter, UA abnormal as expected, per spouse this is changed diligently every month at [facility], urine culture grew 60,000 colonies of yeast, this is a colonizer, suprapubic catheter changed 6/10, remains afebrile without leukocytosis   Elevated troponin -Troponin from 100s to 200 range now, clinically do not suspect ACS, during his recent admission last week it peaked at 319, seen by cards last week, recommended medical management -2D echo on 6/5 with severe LVH, grade 3 DD, preserved EF, moderate PAH and decreased RV function   AKI on CKD 2 -Mild baseline creatinine around 1.2, renal component as well, mild bump with diuresis -  Now stable at 1.7, continue Lasix 40 Mg daily   Chronic A-fib -Heart rate controlled, INR supratherapeutic, at 4.8 today, 4.4 yesterday, continue to hold  Coumadin for 2 more days, recheck INR on 6/14   BPH, chronic urinary retention -With suprapubic catheter, reportedly completed ABX for UTI 6/7 -Discussed catheter with wife, she reports this is changed every 30 days without fail at [facility], catheter changed 6/10   Dementia -Oriented to self only, delirium precautions -Sleeps a lot during the day -Requires assistance with most ADLs   Code Status: DNR  Discharge Exam: Vitals:   12/16/21 0623 12/16/21 0949  BP:  138/76  Pulse:  (!) 54  Resp: 19 17  Temp:  (!) 97.3 F (36.3 C)  SpO2:  99%   General exam: Elderly chronically ill male laying in bed, oriented to self only, sleeping awakens to verbal stimuli, moderate cognitive deficits HEENT: No JVD CVS: S1-S2, regular rhythm Lungs: Poor air movement otherwise clear Abdomen: Soft, nontender, bowel sounds present Extremities: Trace edema Psychiatry: Poor insight and judgment  Discharge Instructions   Discharge Instructions     Diet - low sodium heart healthy   Complete by: As directed    Increase activity slowly   Complete by: As directed       Allergies as of 12/16/2021       Reactions   Penicillins Rash   Has patient had a PCN reaction causing immediate rash, facial/tongue/throat swelling, SOB or lightheadedness with hypotension: unknown Has patient had a PCN reaction causing severe rash involving mucus membranes or skin necrosis: unknown Has patient had a PCN reaction that required hospitalization : unknown Has patient had a PCN reaction occurring within the last 10 years: no If all of the above answers are "NO", then may proceed with Cephalosporin use.        Medication List     STOP taking these medications    nitrofurantoin (macrocrystal-monohydrate) 100 MG capsule Commonly known as: MACROBID       TAKE these medications    acetaminophen 500 MG tablet Commonly known as: TYLENOL Take 500 mg by mouth 2 (two) times daily.   atorvastatin 20 MG  tablet Commonly known as: LIPITOR Take 20 mg by mouth every evening.   cholecalciferol 25 MCG (1000 UNIT) tablet Commonly known as: VITAMIN D3 Take 2,000 Units by mouth daily.   DULoxetine 20 MG capsule Commonly known as: CYMBALTA Take 20 mg by mouth daily.   furosemide 20 MG tablet Commonly known as: LASIX Take 2 tablets (40 mg total) by mouth daily. Take extra '20mg'$  for weight gain of 2-3lbs in 1 day or 5 lbs in 1 week What changed:  how much to take when to take this additional instructions   methenamine 1 g tablet Commonly known as: MANDELAMINE Take 1,000 mg by mouth 2 (two) times daily.   mupirocin ointment 2 % Commonly known as: BACTROBAN Apply 1 application. topically See admin instructions. Bid x  7 days   polyethylene glycol 17 g packet Commonly known as: MIRALAX / GLYCOLAX Take 17 g by mouth daily.   potassium chloride 10 MEQ CR capsule Commonly known as: MICRO-K Take 2 capsules (20 mEq total) by mouth daily. What changed:  how much to take when to take this additional instructions   triamcinolone lotion 0.1 % Commonly known as: KENALOG Apply 1 application. topically 2 (two) times daily as needed (rash on back).   warfarin 4 MG tablet Commonly known as: COUMADIN Take 1 tablet (4  mg total) by mouth every Monday, Wednesday, and Friday. Hold Coumadin for 2days, check INR 6/14 and resume '4mg'$  daily alternating with '5mg'$  daily, if INR in therapeutic range Start taking on: December 18, 2021 What changed: additional instructions   warfarin 5 MG tablet Commonly known as: COUMADIN Take 1 tablet (5 mg total) by mouth See admin instructions. Hold Coumadin for 2 days and then resume on 6/14, as per previous regimen 4 Mg daily alternating with 5 Mg daily Start taking on: December 18, 2021 What changed:  additional instructions These instructions start on December 18, 2021. If you are unsure what to do until then, ask your doctor or other care provider.       Allergies   Allergen Reactions   Penicillins Rash    Has patient had a PCN reaction causing immediate rash, facial/tongue/throat swelling, SOB or lightheadedness with hypotension: unknown Has patient had a PCN reaction causing severe rash involving mucus membranes or skin necrosis: unknown Has patient had a PCN reaction that required hospitalization : unknown Has patient had a PCN reaction occurring within the last 10 years: no If all of the above answers are "NO", then may proceed with Cephalosporin use.     Contact information for after-discharge care     Destination     [facility] .   Service: Skilled Nursing Contact information: [place] [phone]                      The results of significant diagnostics from this hospitalization (including imaging, microbiology, ancillary and laboratory) are listed below for reference.    Significant Diagnostic Studies: DG Chest 2 View  Result Date: 12/12/2021 CLINICAL DATA:  Shortness of breath history of hypertension and AFib. EXAM: CHEST - 2 VIEW COMPARISON:  Radiograph December 11, 2021 FINDINGS: Similar enlarged cardiac silhouette. Unchanged cardiomediastinal contours. Aortic atherosclerosis. No overt pulmonary edema or focal airspace consolidation. Trace bilateral pleural effusions. Thoracic spondylosis. IMPRESSION: Enlarged cardiac silhouette and central vascular prominence without overt pulmonary edema. Trace bilateral pleural effusions. Electronically Signed   By: Dahlia Bailiff M.D.   On: 12/12/2021 11:47   DG Chest Portable 1 View  Result Date: 12/11/2021 CLINICAL DATA:  eval for sob EXAM: PORTABLE CHEST 1 VIEW COMPARISON:  Chest x-ray 12/08/2021 FINDINGS: Persistent cardiomegaly. The heart and mediastinal contours are unchanged. Aortic calcifications No focal consolidation. No pulmonary edema. No pleural effusion. No pneumothorax. No acute osseous  abnormality. IMPRESSION: 1. Cardiomegaly with no active disease. Underlying pericardial effusion not excluded on AP portable technique. 2.  Aortic Atherosclerosis (ICD10-I70.0). Electronically Signed   By: Iven Finn M.D.   On: 12/11/2021 23:45   ECHOCARDIOGRAM COMPLETE  Result Date: 12/09/2021    ECHOCARDIOGRAM REPORT   Patient Name:   Peter Singh Date of Exam: 12/09/2021 Medical Rec #:  [id number]        Height:       79.0 in Accession #:    [id number]       Weight:       265.0 lb Date of Birth:  07/29/1931        BSA:          2.568 m Patient Age:    49 years         BP:           120/78 mmHg Patient Gender: M  HR:           40 bpm. Exam Location:  Inpatient Procedure: 2D Echo, Color Doppler, Cardiac Doppler and Intracardiac            Opacification Agent Indications:    CHF-Acute Diastolic V56.43  History:        Patient has prior history of Echocardiogram examinations, most                 recent 09/06/2014. PAD, Arrythmias:Atrial Fibrillation,                 Signs/Symptoms:Dementia; Risk Factors:Hypertension and                 Dyslipidemia. Chest pain and hypoxia.  Sonographer:    Darlina Sicilian RDCS Referring Phys: [id number] [place]  1. Left ventricular ejection fraction, by estimation, is 50 to 55%. The left ventricle has low normal function. The left ventricle has no regional wall motion abnormalities. There is severe concentric left ventricular hypertrophy. Left ventricular diastolic parameters are consistent with Grade III diastolic dysfunction (restrictive).  2. Right ventricular systolic function is mildly reduced. The right ventricular size is mildly enlarged. There is moderately elevated pulmonary artery systolic pressure.  3. Left atrial size was severely dilated.  4. The mitral valve is normal in structure. No evidence of mitral valve regurgitation. No evidence of mitral stenosis.  5. The aortic valve is normal in structure. Aortic valve regurgitation is not  visualized. Mild aortic valve stenosis. Aortic valve area, by VTI measures 1.60 cm. Aortic valve mean gradient measures 13.5 mmHg. Aortic valve Vmax measures 2.38 m/s.  6. The inferior vena cava is normal in size with greater than 50% respiratory variability, suggesting right atrial pressure of 3 mmHg. FINDINGS  Left Ventricle: Left ventricular ejection fraction, by estimation, is 50 to 55%. The left ventricle has low normal function. The left ventricle has no regional wall motion abnormalities. Definity contrast agent was given IV to delineate the left ventricular endocardial borders. The left ventricular internal cavity size was normal in size. There is severe concentric left ventricular hypertrophy. Left ventricular diastolic parameters are consistent with Grade III diastolic dysfunction (restrictive). Right Ventricle: The right ventricular size is mildly enlarged. No increase in right ventricular wall thickness. Right ventricular systolic function is mildly reduced. There is moderately elevated pulmonary artery systolic pressure. The tricuspid regurgitant velocity is 2.78 m/s, and with an assumed right atrial pressure of 15 mmHg, the estimated right ventricular systolic pressure is 41.6 mmHg. Left Atrium: Left atrial size was severely dilated. Right Atrium: Right atrial size was normal in size. Pericardium: There is no evidence of pericardial effusion. Mitral Valve: The mitral valve is normal in structure. No evidence of mitral valve regurgitation. No evidence of mitral valve stenosis. Tricuspid Valve: The tricuspid valve is normal in structure. Tricuspid valve regurgitation is not demonstrated. No evidence of tricuspid stenosis. Aortic Valve: The aortic valve is normal in structure. Aortic valve regurgitation is not visualized. Mild aortic stenosis is present. Aortic valve mean gradient measures 13.5 mmHg. Aortic valve peak gradient measures 22.6 mmHg. Aortic valve area, by VTI measures 1.60 cm. Pulmonic  Valve: The pulmonic valve was normal in structure. Pulmonic valve regurgitation is not visualized. No evidence of pulmonic stenosis. Aorta: The aortic root is normal in size and structure. Venous: The inferior vena cava is normal in size with greater than 50% respiratory variability, suggesting right atrial pressure of 3 mmHg. IAS/Shunts: No atrial level shunt detected by  color flow Doppler.  LEFT VENTRICLE PLAX 2D LVIDd:         4.30 cm      Diastology LVIDs:         3.40 cm      LV e' medial:    5.29 cm/s LV PW:         2.10 cm      LV E/e' medial:  17.2 LV IVS:        2.50 cm      LV e' lateral:   6.56 cm/s LVOT diam:     2.60 cm      LV E/e' lateral: 13.9 LV SV:         76 LV SV Index:   30 LVOT Area:     5.31 cm  LV Volumes (MOD) LV vol d, MOD A2C: 137.0 ml LV vol d, MOD A4C: 131.0 ml LV vol s, MOD A2C: 37.1 ml LV vol s, MOD A4C: 37.1 ml LV SV MOD A2C:     99.9 ml LV SV MOD A4C:     131.0 ml LV SV MOD BP:      98.1 ml RIGHT VENTRICLE RV S prime:     8.00 cm/s TAPSE (M-mode): 1.2 cm LEFT ATRIUM              Index        RIGHT ATRIUM           Index LA diam:        4.80 cm  1.87 cm/m   RA Area:     22.60 cm LA Vol (A2C):   118.0 ml 45.94 ml/m  RA Volume:   52.90 ml  20.60 ml/m LA Vol (A4C):   145.0 ml 56.45 ml/m LA Biplane Vol: 132.0 ml 51.39 ml/m  AORTIC VALVE AV Area (Vmax):    1.71 cm AV Area (Vmean):   1.40 cm AV Area (VTI):     1.60 cm AV Vmax:           237.50 cm/s AV Vmean:          170.500 cm/s AV VTI:            0.479 m AV Peak Grad:      22.6 mmHg AV Mean Grad:      13.5 mmHg LVOT Vmax:         76.40 cm/s LVOT Vmean:        45.100 cm/s LVOT VTI:          0.144 m LVOT/AV VTI ratio: 0.30  AORTA Ao Root diam: 4.00 cm Ao Asc diam:  3.70 cm MITRAL VALVE               TRICUSPID VALVE MV Area (PHT): 4.11 cm    TR Peak grad:   30.9 mmHg MV Decel Time: 185 msec    TR Vmax:        278.00 cm/s MV E velocity: 91.10 cm/s                            SHUNTS                            Systemic VTI:  0.14 m                             Systemic Diam: 2.60 cm  Berniece Salines DO Electronically signed by Berniece Salines DO Signature Date/Time: 12/09/2021/5:40:25 PM    Final    DG Chest Portable 1 View  Result Date: 12/08/2021 CLINICAL DATA:  resp. distress EXAM: PORTABLE CHEST 1 VIEW COMPARISON:  Radiographs October 07, 2016 FINDINGS: Evaluation is limited by patient positioning. The cardiomediastinal silhouette is unchanged and enlarged in contour.Central vascular congestion without overt edema. No pleural effusion. No pneumothorax. No acute pleuroparenchymal abnormality. Visualized abdomen is unremarkable. IMPRESSION: Cardiomegaly.  Central vascular congestion without overt edema. Electronically Signed   By: Valentino Saxon M.D.   On: 12/08/2021 19:54    Microbiology: Recent Results (from the past 240 hour(s))  SARS Coronavirus 2 by RT PCR (hospital order, performed in [facility] hospital lab) *cepheid single result test* Anterior Nasal Swab     Status: None   Collection Time: 12/08/21  7:04 PM   Specimen: Anterior Nasal Swab  Result Value Ref Range Status   SARS Coronavirus 2 by RT PCR NEGATIVE NEGATIVE Final    Comment: (NOTE) SARS-CoV-2 target nucleic acids are NOT DETECTED.  The SARS-CoV-2 RNA is generally detectable in upper and lower respiratory specimens during the acute phase of infection. The lowest concentration of SARS-CoV-2 viral copies this assay can detect is 250 copies / mL. A negative result does not preclude SARS-CoV-2 infection and should not be used as the sole basis for treatment or other patient management decisions.  A negative result may occur with improper specimen collection / handling, submission of specimen other than nasopharyngeal swab, presence of viral mutation(s) within the areas targeted by this assay, and inadequate number of viral copies (<250 copies / mL). A negative result must be combined with clinical observations, patient history, and epidemiological  information.  Fact Sheet for Patients:   https://www.patel.info/  Fact Sheet for Healthcare Providers: https://hall.com/  This test is not yet approved or  cleared by the [place] FDA and has been authorized for detection and/or diagnosis of SARS-CoV-2 by FDA under an Emergency Use Authorization (EUA).  This EUA will remain in effect (meaning this test can be used) for the duration of the COVID-19 declaration under Section 564(b)(1) of the Act, 21 U.S.C. section 360bbb-3(b)(1), unless the authorization is terminated or revoked sooner.  Performed at [facility], [place] [address], [place], [place] [address]   Urine Culture     Status: Abnormal   Collection Time: 12/12/21  8:22 AM   Specimen: Urine, Catheterized  Result Value Ref Range Status   Specimen Description URINE, CATHETERIZED  Final   Special Requests   Final    NONE Performed at [facility], [address], [place], [place] [address]    Culture 60,000 COLONIES/mL YEAST (A)  Final   Report Status 12/13/2021 FINAL  Final     Labs: Basic Metabolic Panel: Recent Labs  Lab 12/12/21 0650 12/13/21 0702 12/14/21 0131 12/15/21 0035 12/16/21 0146  NA 138 137 138 142 142  K 4.0 4.0 3.5 3.5 3.4*  CL 104 100 104 105 105  CO2 '25 24 26 26 27  '$ GLUCOSE 124* 114* 132* 129* 122*  BUN 37* 45* 50* 57* 55*  CREATININE 1.67* 1.70* 1.75* 1.72* 1.48*  CALCIUM 9.7 10.1 10.1 10.4* 10.7*   Liver Function Tests: Recent Labs  Lab 12/11/21 2010  AST 16  ALT 8  ALKPHOS 82  BILITOT 1.9*  PROT 6.2*  ALBUMIN 3.4*   No results for input(s): "LIPASE", "AMYLASE" in the last 168 hours. No results for  input(s): "AMMONIA" in the last 168 hours. CBC: Recent Labs  Lab 12/11/21 2010 12/12/21 0650 12/13/21 0702 12/14/21 0131 12/15/21 0035 12/16/21 0146  WBC 5.1 4.2 4.0 4.1 4.0 3.4*  NEUTROABS 4.1  --   --   --   --   --   HGB 13.2 12.5* 13.8 12.6* 12.7* 13.2  HCT 43.1  40.2 42.5 39.5 40.5 40.3  MCV 90.5 88.2 88.4 86.4 86.9 86.1  PLT 88* PLATELET CLUMPS NOTED ON SMEAR, UNABLE TO ESTIMATE 88* 94* 95* 105*   Cardiac Enzymes: No results for input(s): "CKTOTAL", "CKMB", "CKMBINDEX", "TROPONINI" in the last 168 hours. BNP: BNP (last 3 results) Recent Labs    12/08/21 1904 12/11/21 2209  BNP 304.1* 233.3*    ProBNP (last 3 results) No results for input(s): "PROBNP" in the last 8760 hours.  CBG: No results for input(s): "GLUCAP" in the last 168 hours.     Signed:  Domenic Polite MD.  [facility] 12/16/2021, 11:11 AM

## 2021-12-15 NOTE — Progress Notes (Addendum)
Date and time results received: 12/15/21 @ 2:05  Test: INR  Critical Value: 4.1  Name of Provider Notified: Alcario Drought, MD & Georga Bora, [facility]  No signs of bleeding, will continue to monitor.   Update... Lab order for PT/INR scheduled for 6/11 @ 14:00

## 2021-12-15 NOTE — NC FL2 (Signed)
[place] LEVEL OF CARE SCREENING TOOL     IDENTIFICATION  Patient Name: Peter Singh Birthdate: February 10, 1932 Sex: male Admission Date (Current Location): 12/11/2021  [facility] and [place] Number:  Herbalist and Address:  [facility] [place] [address] [place], [place] [address]      Provider Number: [id number]  Attending Physician Name and Address:  Domenic Polite, MD  Relative Name and Phone Number:       Current Level of Care: Hospital Recommended Level of Care: [place] Prior Approval Number:    Date Approved/Denied:   PASRR Number:  [id number]  Discharge Plan: SNF    Current Diagnoses: Patient Active Problem List   Diagnosis Date Noted   NSTEMI (non-ST elevated myocardial infarction) ([place]) 12/09/2021   Acute respiratory failure with hypoxia ([place]) 12/08/2021   UTI (urinary tract infection) 12/08/2021   Cyanosis of fingertip 11/28/2021   Slow transit constipation 04/20/2020   MRSA (methicillin resistant staph aureus) culture positive 11/17/2019   Suprapubic catheter ([place]) 10/30/2019   Peripheral vascular disease ([place]) 09/02/2019   Knee pain 06/24/2019   Vascular dementia without behavioral disturbance ([place]) 05/07/2019   Other neutropenia ([place]) 11/16/2018   Chronic diastolic heart failure ([place]) 06/08/2018   Encounter for therapeutic drug monitoring 03/19/2017   Urinary retention due to benign prostatic hyperplasia 10/14/2016   Renal mass, right 10/14/2016   Dementia without behavioral disturbance ([place]) 10/14/2016   Thrombocytopenia ([place]) 10/07/2016   Hereditary and idiopathic peripheral neuropathy 07/06/2014   Essential hypertension 03/24/2014   Hyperlipidemia 03/24/2014   Chronic anticoagulation 03/24/2014   Atrial fibrillation ([place]) 05/17/2013    Orientation RESPIRATION BLADDER Height & Weight     Self  Normal Indwelling catheter Weight: 242 lb 1 oz (109.8 kg) Height:  '6\' 7"'$  (200.7 cm)   BEHAVIORAL SYMPTOMS/MOOD NEUROLOGICAL BOWEL NUTRITION STATUS      Continent Diet (heart healthy)  AMBULATORY STATUS COMMUNICATION OF NEEDS Skin   Extensive Assist Verbally Normal                       Personal Care Assistance Level of Assistance  Bathing, Feeding, Dressing Bathing Assistance: Limited assistance Feeding assistance: Independent Dressing Assistance: Limited assistance     Functional Limitations Info  Sight Sight Info: Impaired        SPECIAL CARE FACTORS FREQUENCY  PT (By licensed PT), OT (By licensed OT)     PT Frequency: 5x/wk OT Frequency: 5x/wk            Contractures Contractures Info: Not present    Additional Factors Info  Code Status, Allergies, Psychotropic Code Status Info: DNR Allergies Info: Penicillins Psychotropic Info: Cymbalta '20mg'$  daily         Current Medications (12/15/2021):  This is the current hospital active medication list Current Facility-Administered Medications  Medication Dose Route Frequency Provider Last Rate Last Admin   acetaminophen (TYLENOL) tablet 650 mg  650 mg Oral Q6H PRN Shela Leff, MD   650 mg at 12/14/21 2217   Or   acetaminophen (TYLENOL) suppository 650 mg  650 mg Rectal Q6H PRN Shela Leff, MD       atorvastatin (LIPITOR) tablet 20 mg  20 mg Oral QPM Shela Leff, MD   20 mg at 12/14/21 1735   DULoxetine (CYMBALTA) DR capsule 20 mg  20 mg Oral Daily Shela Leff, MD   20 mg at 12/14/21 1101   furosemide (LASIX) tablet 40 mg  40  mg Oral Daily Domenic Polite, MD   40 mg at 12/14/21 1443   polyethylene glycol (MIRALAX / GLYCOLAX) packet 17 g  17 g Oral Daily Shela Leff, MD   17 g at 12/14/21 1100   Warfarin - Pharmacist Dosing Inpatient   Does not apply q1600 Tyrone Apple, [facility]   Given at 12/12/21 1733     Discharge Medications: Please see discharge summary for a list of discharge medications.  Relevant Imaging Results:  Relevant Lab Results:   Additional  Information SS#: [id number]  Geralynn Ochs, LCSW

## 2021-12-15 NOTE — Progress Notes (Signed)
Mobility Specialist Progress Note    12/15/21 1533  Mobility  Activity Transferred from chair to bed  Level of Assistance +2 (takes two people)  Games developer wheel walker  Distance Ambulated (ft) 2 ft  Activity Response Tolerated well  $Mobility charge 1 Mobility   Pt received and agreeable. ModA+2 d/t low chair height for pt. Needed cueing for sequencing. Left sitting EOB with NT and wife present.   Hildred Alamin Mobility Specialist

## 2021-12-15 NOTE — Progress Notes (Signed)
ANTICOAGULATION CONSULT NOTE - Follow Up Consult  Pharmacy Consult for Warfarin Indication: atrial fibrillation  Allergies  Allergen Reactions   Penicillins Rash    Has patient had a PCN reaction causing immediate rash, facial/tongue/throat swelling, SOB or lightheadedness with hypotension: unknown Has patient had a PCN reaction causing severe rash involving mucus membranes or skin necrosis: unknown Has patient had a PCN reaction that required hospitalization : unknown Has patient had a PCN reaction occurring within the last 10 years: no If all of the above answers are "NO", then may proceed with Cephalosporin use.     Patient Measurements: Height: '6\' 7"'$  (200.7 cm) Weight: 109.8 kg (242 lb 1 oz) IBW/kg (Calculated) : 93.7  Vital Signs: Temp: 95.7 F (35.4 C) (06/11 0435) Temp Source: Axillary (06/11 0435) BP: 118/71 (06/11 0435) Pulse Rate: 61 (06/11 0435)  Labs: Recent Labs    12/13/21 0702 12/14/21 0131 12/15/21 0035 12/15/21 0753  HGB 13.8 12.6* 12.7*  --   HCT 42.5 39.5 40.5  --   PLT 88* 94* 95*  --   LABPROT 29.4* 28.7* 39.1* 41.0*  INR 2.8* 2.7* 4.1* 4.3*  CREATININE 1.70* 1.75* 1.72*  --      Estimated Creatinine Clearance: 40.1 mL/min (A) (by C-G formula based on SCr of 1.72 mg/dL (H)).   Medications:  Scheduled:   atorvastatin  20 mg Oral QPM   DULoxetine  20 mg Oral Daily   furosemide  40 mg Oral Daily   polyethylene glycol  17 g Oral Daily   Warfarin - Pharmacist Dosing Inpatient   Does not apply q1600    Assessment: 88 yo M admitted for CHF exacerbation. Pt on Warfarin PTA for hx atrial fibrillation. Pharmacy consulted for Warfarin dosing.   INR was previously therapeutic this admission (6/4-6/10), but this morning INR now supratherapeutic at 4.1. STAT INR was ordered and resulted at 4.3. Hgb 12.7, plt 95 - low stable. No signs/symptoms of bleeding noted per RN.   PTA Warfarin regimen: 4 mg on MWF, 5 mg on all other days.   Goal of Therapy:   INR 2-3 Monitor platelets by anticoagulation protocol: Yes   Plan:  Hold Warfarin dose tonight.  Daily INR. Monitor CBC and for signs/symptoms of bleeding.   Vance Peper, PharmD PGY1 Pharmacy Resident Phone [phone] 12/15/2021 10:13 AM   Please check AMION for all [place] phone numbers After 10:00 PM, call [place] [phone]

## 2021-12-16 DIAGNOSIS — J9601 Acute respiratory failure with hypoxia: Secondary | ICD-10-CM | POA: Diagnosis not present

## 2021-12-16 LAB — CBC
HCT: 40.3 % (ref 39.0–52.0)
Hemoglobin: 13.2 g/dL (ref 13.0–17.0)
MCH: 28.2 pg (ref 26.0–34.0)
MCHC: 32.8 g/dL (ref 30.0–36.0)
MCV: 86.1 fL (ref 80.0–100.0)
Platelets: 105 10*3/uL — ABNORMAL LOW (ref 150–400)
RBC: 4.68 MIL/uL (ref 4.22–5.81)
RDW: 17.2 % — ABNORMAL HIGH (ref 11.5–15.5)
WBC: 3.4 10*3/uL — ABNORMAL LOW (ref 4.0–10.5)
nRBC: 0 % (ref 0.0–0.2)

## 2021-12-16 LAB — BASIC METABOLIC PANEL
Anion gap: 10 (ref 5–15)
BUN: 55 mg/dL — ABNORMAL HIGH (ref 8–23)
CO2: 27 mmol/L (ref 22–32)
Calcium: 10.7 mg/dL — ABNORMAL HIGH (ref 8.9–10.3)
Chloride: 105 mmol/L (ref 98–111)
Creatinine, Ser: 1.48 mg/dL — ABNORMAL HIGH (ref 0.61–1.24)
GFR, Estimated: 46 mL/min — ABNORMAL LOW (ref >60)
Glucose, Bld: 122 mg/dL — ABNORMAL HIGH (ref 70–99)
Potassium: 3.4 mmol/L — ABNORMAL LOW (ref 3.5–5.1)
Sodium: 142 mmol/L (ref 135–145)

## 2021-12-16 LAB — PROTIME-INR
INR: 4.8 (ref 0.8–1.2)
Prothrombin Time: 44.7 seconds — ABNORMAL HIGH (ref 11.4–15.2)

## 2021-12-16 MED ORDER — POTASSIUM CHLORIDE CRYS ER 20 MEQ PO TBCR
40.0000 meq | EXTENDED_RELEASE_TABLET | Freq: Two times a day (BID) | ORAL | Status: DC
Start: 2021-12-16 — End: 2021-12-16
  Administered 2021-12-16: 40 meq via ORAL
  Filled 2021-12-16: qty 2

## 2021-12-16 MED ORDER — WARFARIN SODIUM 5 MG PO TABS: 5.0000 mg | ORAL_TABLET | ORAL | Status: DC

## 2021-12-16 MED ORDER — FUROSEMIDE 10 MG/ML IJ SOLN
40.0000 mg | Freq: Once | INTRAMUSCULAR | Status: AC
  Administered 2021-12-16: 40 mg via INTRAVENOUS
  Filled 2021-12-16: qty 4

## 2021-12-16 MED ORDER — FUROSEMIDE 20 MG PO TABS: 40.0000 mg | ORAL_TABLET | Freq: Every day | ORAL | Status: DC

## 2021-12-16 MED ORDER — POTASSIUM CHLORIDE ER 10 MEQ PO CPCR: 20.0000 meq | ORAL_CAPSULE | ORAL | Status: DC

## 2021-12-16 MED ORDER — POTASSIUM CHLORIDE ER 10 MEQ PO CPCR: 20.0000 meq | ORAL_CAPSULE | Freq: Every day | ORAL | Status: DC

## 2021-12-16 MED ORDER — WARFARIN SODIUM 4 MG PO TABS: 4.0000 mg | ORAL_TABLET | ORAL | Status: DC

## 2021-12-16 NOTE — TOC Transition Note (Signed)
Transition of Care [facility] - CM/SW Discharge Note   Patient Details  Name: Peter Singh MRN: [id number] Date of Birth: [birth year]  [facility]) CM/SW Contact:  Milas Gain, [place] Phone Number: 12/16/2021, 11:28 AM   Clinical Narrative:     Patient will DC to: [facility]  Anticipated DC date: 12/16/2021  Family notified: Ship broker by: Corey Harold  ?  Per MD patient ready for DC to [facility] with palliative services to follow . RN, patient, Fabio Pierce with [facility],patient's family, and facility notified of DC. Discharge Summary sent to facility. RN given number for report tele # [phone] RM#[id number]. DC packet on chart. DNR signed by MD attached to patients DC packet. Ambulance transport requested for patient.  CSW signing off.   Final next level of care: Skilled Nursing Facility Barriers to Discharge: No Barriers Identified   Patient Goals and CMS Choice Patient states their goals for this hospitalization and ongoing recovery are:: patient unable to participate in goal setting, not oriented CMS Medicare.gov Compare Post Acute Care list provided to:: Patient Represenative (must comment) Choice offered to / list presented to : Spouse (Spouse [name])  Discharge Placement              Patient chooses bed at: [facility] Patient to be transferred to facility by: [place] Name of family member notified: [name] Patient and family notified of of transfer: 12/16/21  Discharge Plan and Services     Post Acute Care Choice: [place]                               Social Determinants of Health (SDOH) Interventions     Readmission Risk Interventions     No data to display

## 2021-12-16 NOTE — Care Management Important Message (Signed)
Important Message  Patient Details  Name: Peter Singh MRN: [id number] Date of Birth: [birth year]   Medicare Important Message Given:  Yes     Shelda Altes 12/16/2021, 8:32 AM

## 2021-12-16 NOTE — TOC Progression Note (Addendum)
Transition of Care [facility]) - Progression Note    Patient Details  Name: Peter Singh MRN: [id number] Date of Birth: 04/30/1932  Transition of Care [facility]) CM/SW [place], [place] Phone Number: 12/16/2021, 10:48 AM  Clinical Narrative:      Update- CSW spoke with Autumn at [facility] who confirmed they can accept patient back if medically ready today. CSW will fax over Dc summary and FL2.   CSW called [facility] to confirm they can accept patient for dc today. CSW Lvm and waiting callback. CSW received consult for outpatient palliative services to follow patient at SNF. CSW called patients spouse [name] and [name] gave CSW permission to make referral to [place] for palliative services to follow patient at SNF. CSW called [facility] and spoke with [place] and made referral for palliative services to follow patient at SNF. CSW will continue to follow.  Expected Discharge Plan: [place] Barriers to Discharge: Continued Medical Work up  Expected Discharge Plan and Services Expected Discharge Plan: [place] Choice: [place] arrangements for the past 2 months: [place] Expected Discharge Date: 12/16/21                                     Social Determinants of Health (SDOH) Interventions    Readmission Risk Interventions     No data to display

## 2021-12-16 NOTE — Progress Notes (Signed)
Date and time results received: 12/16/21 @  1:45   Test: INR   Critical Value: 4.8   Name of Provider Notified: Alcario Drought, MD & Georga Bora, [facility]   No signs of bleeding, will continue to monitor.

## 2021-12-16 NOTE — Progress Notes (Signed)
ANTICOAGULATION CONSULT NOTE - Follow Up Consult  Pharmacy Consult for Warfarin Indication: atrial fibrillation  Allergies  Allergen Reactions   Penicillins Rash    Has patient had a PCN reaction causing immediate rash, facial/tongue/throat swelling, SOB or lightheadedness with hypotension: unknown Has patient had a PCN reaction causing severe rash involving mucus membranes or skin necrosis: unknown Has patient had a PCN reaction that required hospitalization : unknown Has patient had a PCN reaction occurring within the last 10 years: no If all of the above answers are "NO", then may proceed with Cephalosporin use.     Patient Measurements: Height: '6\' 7"'$  (200.7 cm) Weight: 109.7 kg (241 lb 12.8 oz) IBW/kg (Calculated) : 93.7  Vital Signs: Temp: 97.3 F (36.3 C) (06/12 0949) Temp Source: Oral (06/12 0949) BP: 138/76 (06/12 0949) Pulse Rate: 54 (06/12 0949)  Labs: Recent Labs    12/14/21 0131 12/15/21 0035 12/15/21 0753 12/16/21 0146  HGB 12.6* 12.7*  --  13.2  HCT 39.5 40.5  --  40.3  PLT 94* 95*  --  105*  LABPROT 28.7* 39.1* 41.0* 44.7*  INR 2.7* 4.1* 4.3* 4.8*  CREATININE 1.75* 1.72*  --  1.48*     Estimated Creatinine Clearance: 46.6 mL/min (A) (by C-G formula based on SCr of 1.48 mg/dL (H)).   Medications:  Scheduled:   atorvastatin  20 mg Oral QPM   DULoxetine  20 mg Oral Daily   polyethylene glycol  17 g Oral Daily   potassium chloride  40 mEq Oral BID   Warfarin - Pharmacist Dosing Inpatient   Does not apply q1600    Assessment: 88 yo M admitted for CHF exacerbation. Pt on Warfarin PTA for hx atrial fibrillation. Pharmacy consulted for Warfarin dosing.   INR was previously therapeutic this admission (6/4-6/10), but and has been above goal since 6/11. No major drug interactions noted -INR = 4.8 with trend up  PTA Warfarin regimen: 4 mg on MWF, 5 mg on all other days.   Goal of Therapy:  INR 2-3 Monitor platelets by anticoagulation protocol:  Yes   Plan:  Hold Warfarin dose tonight.  Daily INR. If INR continues to climb could consider vitamin K '1mg'$  po x1  Hildred Laser, PharmD Clinical Pharmacist **Pharmacist phone directory can now be found on [email or web address] ([email or web address]).  Listed under [place].   Please check AMION for all [place] phone numbers After 10:00 PM, call [place] [phone]

## 2021-12-16 NOTE — Progress Notes (Signed)
Report called to [employer] at [phone].  Patient and wife notified of d/c.  Awaiting PTAR for d/c.

## 2021-12-16 NOTE — Progress Notes (Signed)
[facility] ([facility]) Hospital Liaison Note  Notified by TOC manager of patient/family request for [facility] palliative services at SNF after discharge.   [facility] hospital liaison will follow patient for discharge disposition.   Please call with any hospice or outpatient palliative care related questions.   Thank you for the opportunity to participate in this patient's care.   Shanita Wicker, LCSW [facility] Hospital Liaison [phone]  

## 2021-12-17 ENCOUNTER — Telehealth: Payer: Self-pay

## 2021-12-17 NOTE — Telephone Encounter (Signed)
Transition Care Management Unsuccessful Follow-up Telephone Call  Date of discharge and from where:  12/16/2021; [facility]   Attempts:  1st Attempt  Reason for unsuccessful TCM follow-up call:  Unable to be reached ;due to release into a skilled nursing facility.

## 2021-12-18 DIAGNOSIS — Z7901 Long term (current) use of anticoagulants: Secondary | ICD-10-CM | POA: Diagnosis not present

## 2021-12-19 NOTE — Progress Notes (Unsigned)
Cardiology Office Note:    Date:  12/23/2021   ID:  Peter Singh, DOB [birth year], MRN [id number]  PCP:  [name], MD   [facility] Providers Cardiologist:  Sinclair Grooms, MD     Referring MD: [name], MD   Chief Complaint: hospital follow-up hypoxia, dyspnea  History of Present Illness:    Peter Singh is a pleasantly demented 88 y.o. male with a hx of chronic HFpEF, longstanding persistent atrial fibrillation on chronic anticoagulation, hyperlipidemia, hypertension, CKD, chronic urinary retention s/p suprapubic catheter, PVD, dementia, CVA, and thrombocytopenia.  Has been followed by Dr. Tamala Julian for many years. Last encounter was a telehealth visit on 10/13/19.  Admission 6/4-12/10/21 from SNF with reported chest pain and hypoxia.  History given by wife due to patient's dementia.  On arrival to ED BNP was 304, elevated troponin 86 ? 150 ? 212. EKG atrial fibrillation, he became hypoxic to 90% on room air with improvement on elevated troponin felt to be in the setting of acute on chronic HFpEF with nonrebreather. CXR showed cardiomegaly, central vascular congestion with edema.  Elevated troponin felt to be in the setting of acute on chronic HFpEF with echocardiogram 12/09/2021 that revealed severe concentric LVH, grade 3 diastolic dysfunction, LVEF 50 to 55%, moderately elevated PASP, mild aortic stenosis.   He returned to the hospital on 12/11/21 with hypoxia on room air. Was discharged without oxygen and had an acute event where he became clammy, pale and diaphoretic.EMS was called. Hs troponin remained elevated at 109 ? 192. No evidence of pulmonary edema on CXR, scant bilateral pleural effusions. INR was subtherapeutic at discharge. Creatinine stable at 1.7. Discharged back to SNF on 12/15/21 with recommendation for palliative care consult.  Today, he is here with his son and wife and is traveling by wheelchair. He lives in skilled nursing at [facility] and she is in  independent living. He is alert and denies specific concerns. He thinks we are in [place]. His wife advised they have completed a palliative care consult and feel very positive moving forward with their guidance. Goal is to keep patient from further hospitalization. He has bilateral lower extremity edema, is wearing compressing stockings. Wife reports he ambulates with a walker at SNF to and from activities and meals. Occasionally reports lightheadedness. Encouraged slow careful movement.   Past Medical History:  Diagnosis Date   Adult failure to thrive    Per incoming records from [facility]   Allergic rhinitis    Per incoming records from [facility]   Atrial fibrillation [facility])    BPH (benign prostatic hyperplasia)    Per incoming records from [place] of kidney [facility])    Right, Per incoming records from [place]    Per incoming records from [facility]   Cerebrovascular accident, late effects    Per incoming records from [place] kidney disease, stage III (moderate) ([place])    Per incoming records from [place] [facility])    Per incoming records from [facility]   Cognitive impairment    Mild, Per incoming records from [facility]   Constipation    Per incoming records from [facility]   [facility])    Diarrhea    Better off Aricept, Per incoming records from [facility]   Diastolic dysfunction    on 2D Echo  07/2009 and 2016, Per incoming records from [facility]   Diverticulosis    Mild, Per incoming records from [facility]   Diverticulosis of colon (without mention of hemorrhage)    ED (erectile dysfunction)    Per incoming records from [facility]   History of Coumadin therapy    Per incoming records  from [facility]   History of echocardiogram 09/2014   Per incoming records from [facility]   Hyperlipemia    Hypertension    Kidney mass    Per incoming records from [place] leg edema    Per incoming records from [place] [facility])    Neuropathy    Per incoming records from [facility]   OA (osteoarthritis)    Per incoming records from [facility]   Peripheral neuropathy    Per incoming records from [place] history of colonic [place] & 2004   adenomatous polyps   Pulmonary arterial hypertension ([place])    Per incoming records from [place]    Per incoming records from [facility]   Thrombocytopenia [facility]) 2017   Per incoming records from [facility]   UTI (urinary tract infection)    Sepsis, Per incoming records from [place] as ambulation aid    Per incoming records from [facility]    Past Surgical History:  Procedure Laterality Date   APPENDECTOMY     Per incoming records from [place]     Per incoming records from [place]  11/21/2019   KNEE SURGERY     right   PILONIDAL CYST DRAINAGE     POLYPECTOMY     Per incoming records from [place] tumor lumbar spine     SPINE SURGERY     tumor removed   THORACIC 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   cholecalciferol (VITAMIN D3) 25 MCG (1000 UT) tablet Take 2,000 Units by mouth  daily.    DULoxetine (CYMBALTA) 20 MG capsule Take 20 mg by mouth daily.   furosemide (LASIX) 20 MG tablet Take 2 tablets (40 mg total) by mouth daily. Take extra '20mg'$  for weight gain of 2-3lbs in 1 day or 5 lbs in 1 week   methenamine (MANDELAMINE) 1 g tablet Take 1,000 mg by mouth 2 (two) times daily.   mupirocin ointment (BACTROBAN) 2 % Apply 1 application. topically See admin instructions. Bid x  7 days   polyethylene glycol (MIRALAX / GLYCOLAX) 17 g packet Take 17 g by mouth daily.   potassium chloride (MICRO-K) 10 MEQ CR capsule Take 10 mEq by mouth every Tuesday, Thursday, and Saturday at 6 PM.   triamcinolone lotion (KENALOG) 0.1 % Apply 1 application. topically 2 (two) times daily as needed (rash on back).   warfarin (COUMADIN) 4 MG tablet Take 1 tablet (4 mg total) by mouth every Monday, Wednesday, and Friday. Hold Coumadin for 2days, check INR 6/14 and resume '4mg'$  daily alternating with '5mg'$  daily, if  INR in therapeutic range   warfarin (COUMADIN) 5 MG tablet Take 1 tablet (5 mg total) by mouth See admin instructions. Hold Coumadin for 2 days and then resume on 6/14, as per previous regimen 4 Mg daily alternating with 5 Mg daily   [DISCONTINUED] atorvastatin (LIPITOR) 20 MG tablet Take 20 mg by mouth every evening.   Current Facility-Administered Medications for the 12/23/21 encounter (Office Visit) with Ann Maki, Lanice Schwab, NP  Medication   mupirocin ointment (BACTROBAN) 2 %     Allergies:   Penicillins   Social History   Socioeconomic History   Marital status: Married    Spouse name: Not on file   Number of children: 2   Years of education: Not on file   Highest education level: Bachelor's degree (e.g., BA, AB, BS)  Occupational History   Occupation: retired  Tobacco Use   Smoking status: Former    Packs/day: 3.00    Types: Cigarettes    Quit date: 06/09/1975    Years since quitting: 46.5   Smokeless tobacco: Never  Vaping Use   Vaping Use: Never used  Substance and  Sexual Activity   Alcohol use: Yes    Alcohol/week: 0.0 standard drinks of alcohol    Comment: 2-3 drinks per day   Drug use: No   Sexual activity: Not on file  Other Topics Concern   Not on file  Social History Narrative   Patient is married with 2 children, 3 grandchildren    Patient is retired Engineer, maintenance (IT)   Patient lives at [place] Strain: Not on file  Food Insecurity: Not on file  Transportation Needs: Not on file  Physical Activity: Not on file  Stress: Not on file  Social Connections: Not on file     Family History: The patient's family history includes CVA in his father; Stroke in his father; Tuberculosis in his father. There is no history of Neuropathy.  ROS:   Please see the history of present illness.  All other systems reviewed and are negative.  Labs/Other Studies Reviewed:    The following studies were reviewed today:  Echo 12/09/21  1. Left ventricular ejection fraction, by estimation, is 50 to 55%. The  left ventricle has low normal function. The left ventricle has no regional  wall motion abnormalities. There is severe concentric left ventricular  hypertrophy. Left ventricular  diastolic parameters are consistent with Grade III diastolic dysfunction  (restrictive).   2. Right ventricular systolic function is mildly reduced. The right  ventricular size is mildly enlarged. There is moderately elevated  pulmonary artery systolic pressure.   3. Left atrial size was severely dilated.   4. The mitral valve is normal in structure. No evidence of mitral valve  regurgitation. No evidence of mitral stenosis.   5. The aortic valve is normal in structure. Aortic valve regurgitation is  not visualized. Mild aortic valve stenosis. Aortic valve area, by VTI  measures 1.60 cm. Aortic valve mean gradient measures 13.5 mmHg. Aortic  valve Vmax measures 2.38 m/s.   6. The inferior vena cava is normal in size with greater  than 50%  respiratory variability, suggesting right atrial pressure of 3 mmHg.   CXR 12/12/21  IMPRESSION: Enlarged cardiac silhouette and central vascular prominence without overt pulmonary edema. Trace bilateral pleural effusions.  Recent Labs: 12/11/2021: ALT 8; B Natriuretic Peptide 233.3 12/16/2021: BUN 55; Creatinine, Ser 1.48; Hemoglobin 13.2; Platelets 105; Potassium 3.4; Sodium 142  Recent Lipid Panel    Component Value Date/Time   CHOL 129 08/09/2020 0000   TRIG 86 08/09/2020 0000   HDL 32 (A) 08/09/2020 0000   LDLCALC 80 08/09/2020 0000     Risk Assessment/Calculations:       Physical Exam:    VS:  BP 98/60 (BP Location: Left Arm, Patient Position: Sitting, Cuff Size: Normal)   Pulse 62   Ht '6\' 7"'$  (2.007 m)   Wt 247 lb (112 kg)   BMI 27.83 kg/m     Wt Readings from Last 3 Encounters:  12/23/21 262 lb (118.8 kg)  12/23/21 247 lb (112 kg)  12/20/21 262 lb (118.8 kg)     GEN:  Well nourished, well developed in no acute distress HEENT: Normal NECK: No JVD; No carotid bruits CARDIAC: Irregular RR, no murmurs, rubs, gallops RESPIRATORY:  Clear to auscultation without rales, wheezing or rhonchi  ABDOMEN: Soft, non-tender, non-distended MUSCULOSKELETAL:  Non-pitting edema bilateral LE; No deformity. 2+ pedal pulses, equal bilaterally SKIN: Warm and dry NEUROLOGIC:  Alert to person and family members PSYCHIATRIC:  Normal affect   EKG:  EKG is not ordered today.    Diagnoses:    1. Chronic diastolic heart failure ([place])   2. Longstanding persistent atrial fibrillation ([place])   3. Chronic anticoagulation    Assessment and Plan:     Chronic HFpEF: Two recent hospital admissions for acute hypoxic respiratory failure felt to be 2/2 acute on chronic diastolic failure. Grade 3 diastolic dysfunction, low normal LVEF 50-55% by echo 12/09/21. GDMT limited by CKD during hospitalization. He returned to SNF and has since undergone palliative care consult. Lasix will be  managed by palliative care team.  He has mild bilateral lower extremity edema today, otherwise appears euvolemic on exam.  Encouraged continued leg compression, regular periods of walking through the day, and leg elevation when resting.   Atrial fibrillation on chronic anticoagulation: Heart rate is well-controlled. On Coumadin, managed at [facility]. No bleeding concerns. He is not on any rate-controlling medications. Management per palliative care team, I advised patient's wife that we are available for consult if needed.   Hyperlipidemia: In an effort to reduce pill burden, we will discontinue atorvastatin.   Disposition: 1 year with Dr. Tamala Julian     Medication Adjustments/Labs and Tests Ordered: Current medicines are reviewed at length with the patient today.  Concerns regarding medicines are outlined above.  No orders of the defined types were placed in this encounter.  No orders of the defined types were placed in this encounter.   Patient Instructions  Medication Instructions:   DISCONTINUE Atorvastatin.  *If you need a refill on your cardiac medications before your next appointment, please call your pharmacy*   Lab Work:  None ordered.  If you have labs (blood work) drawn today and your tests are completely normal, you will receive your results only by: [place] (if you have MyChart) OR A paper copy in the mail If you have any lab test that is abnormal or we need to change your treatment, we will call you to review the results.   Testing/Procedures:   None ordered.   Follow-Up: At [facility], you and your health needs are our priority.  As part of our continuing mission to provide you with exceptional heart care, we have created designated Provider Care Teams.  These Care Teams include your primary Cardiologist (physician) and Advanced Practice Providers (APPs -  Physician Assistants and Nurse Practitioners) who all work together  to provide you with the care  you need, when you need it.  We recommend signing up for the patient portal called "MyChart".  Sign up information is provided on this After Visit Summary.  MyChart is used to connect with patients for Virtual Visits (Telemedicine).  Patients are able to view lab/test results, encounter notes, upcoming appointments, etc.  Non-urgent messages can be sent to your provider as well.   To learn more about what you can do with MyChart, go to [email or web address].    Your next appointment:   1 year(s)  The format for your next appointment:   In Person  Provider:   Sinclair Grooms, MD     Other Instructions  Your physician wants you to follow-up in: 1 year with Dr.Smith.  You will receive a reminder letter in the mail two months in advance. If you don't receive a letter, please call our office to schedule the follow-up appointment.   Important Information About Sugar         Signed, Emmaline Life, NP  12/23/2021 1:16 PM    [facility]

## 2021-12-20 ENCOUNTER — Non-Acute Institutional Stay: Payer: Medicare Other | Admitting: Family Medicine

## 2021-12-20 ENCOUNTER — Encounter: Payer: Self-pay | Admitting: Internal Medicine

## 2021-12-20 ENCOUNTER — Encounter: Payer: Self-pay | Admitting: Family Medicine

## 2021-12-20 VITALS — Wt 246.2 lb

## 2021-12-20 DIAGNOSIS — Z7901 Long term (current) use of anticoagulants: Secondary | ICD-10-CM | POA: Diagnosis not present

## 2021-12-20 DIAGNOSIS — I4811 Longstanding persistent atrial fibrillation: Secondary | ICD-10-CM

## 2021-12-20 DIAGNOSIS — R791 Abnormal coagulation profile: Secondary | ICD-10-CM | POA: Insufficient documentation

## 2021-12-20 LAB — BASIC METABOLIC PANEL
BUN: 31 — AB (ref 4–21)
CO2: 26 — AB (ref 13–22)
Chloride: 101 (ref 99–108)
Creatinine: 1.2 (ref 0.6–1.3)
Glucose: 134
Potassium: 4.1 mEq/L (ref 3.5–5.1)
Sodium: 139 (ref 137–147)

## 2021-12-20 LAB — COMPREHENSIVE METABOLIC PANEL: Calcium: 10.7 (ref 8.7–10.7)

## 2021-12-20 NOTE — Progress Notes (Unsigned)
Provider:  Veleta Miners MD  Location:   [facility] Room Number: 517 Place of Service:  SNF ([phone])  PCP: [name], MD [name], [name] [name], MD as PCP - Cardiology (Cardiology)  Extended Emergency Contact Information Primary Emergency Contact: Bostock,[name] Address: [address]          [place], [place] [address] [name] of [place] Phone: [phone] Mobile Phone: [phone] Relation: Spouse Secondary Emergency Contact: [name],[name] Address: [address]          [place],  [address] [place] Mobile Phone: [phone] Relation: Daughter  Code Status: DNR Goals of Care: Advanced Directive information    12/20/2021   10:40 AM  Advanced Directives  Does Patient Have a Medical Advance Directive? Yes  Type of Advance Directive Out of facility DNR (pink MOST or yellow form);Living will  Does patient want to make changes to medical advance directive? No - Patient declined  Pre-existing out of facility DNR order (yellow form or pink MOST form) Pink MOST form placed in chart (order not valid for inpatient use);Yellow form placed in chart (order not valid for inpatient use)      Chief Complaint  Patient presents with  . Readmit To SNF    Readmission to SNF  . Error    HPI: Patient is a 88 y.o. male seen today for admission to  Past Medical History:  Diagnosis Date  . Adult failure to thrive    Per incoming records from [facility]  . Allergic rhinitis    Per incoming records from [facility]  . Atrial fibrillation ([place])   . BPH (benign prostatic hyperplasia)    Per incoming records from [facility]  . Cancer of kidney [facility])    Right, Per incoming records from [facility]  . Cataract    Per incoming records from [facility]  .  Cerebrovascular accident, late effects    Per incoming records from [facility]  . Chronic kidney disease, stage III (moderate) ([place])    Per incoming records from [facility]  . Claudication [facility])    Per incoming records from [facility]  . Cognitive impairment    Mild, Per incoming records from [facility]  . Constipation    Per incoming records from [facility]  . Dementia ([place])   . Diarrhea    Better off Aricept, Per incoming records from [facility]  . Diastolic dysfunction    on 2D Echo 07/2009 and 2016, Per incoming records from [facility]  . Diverticulosis    Mild, Per incoming records from [facility]  . Diverticulosis of colon (without mention of hemorrhage)   . ED (erectile dysfunction)    Per incoming records from [facility]  . History of Coumadin therapy    Per incoming records from [facility]  . History of echocardiogram 09/2014   Per incoming records from [facility]  . Hyperlipemia   . Hypertension   . Kidney mass    Per incoming records from [facility]  . Lower leg edema    Per incoming records from [facility]  . Melanoma ([place])   . Neuropathy    Per incoming records from [facility]  . OA (osteoarthritis)    Per incoming records from [facility]  .  Peripheral neuropathy    Per incoming records from [facility]  . Personal history of colonic polyps 1999 & 2004   adenomatous polyps  . Pulmonary arterial hypertension ([place])    Per incoming records from [facility]  . Rosacea    Per incoming records from [facility]  . Thrombocytopenia ([place]) 2017   Per incoming records from [facility]  . UTI (urinary tract infection)    Sepsis, Per incoming records from  [facility]  . Ventricular hypertrophy   . Walker as ambulation aid    Per incoming records from [facility]   Past Surgical History:  Procedure Laterality Date  . APPENDECTOMY     Per incoming records from [facility]  . CATARACT EXTRACTION     Per incoming records from [facility]  . IR CATHETER TUBE CHANGE  11/21/2019  . KNEE SURGERY     right  . PILONIDAL CYST DRAINAGE    . POLYPECTOMY     Per incoming records from [facility]  . schwanoma tumor lumbar spine    . SPINE SURGERY     tumor removed  . THORACIC 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, Per incoming records from [facility]  . TONSILLECTOMY AND ADENOIDECTOMY      reports that he quit smoking about 46 years ago. His smoking use included cigarettes. He smoked an average of 3 packs per day. He has never used smokeless tobacco. He reports current alcohol use. He reports that he does not use drugs. Social History   Socioeconomic History  . Marital status: Married    Spouse name: Not on file  . Number of children: 2  . Years of education: Not on file  . Highest education level: Bachelor's degree (e.g., BA, AB, BS)  Occupational History  . Occupation: retired  Tobacco Use  . Smoking status: Former    Packs/day: 3.00    Types: Cigarettes    Quit date: 06/09/1975    Years since quitting: 46.5  . Smokeless tobacco: Never  Vaping Use  . Vaping Use: Never used  Substance and Sexual Activity  . Alcohol use: Yes    Alcohol/week: 0.0 standard drinks of alcohol    Comment: 2-3 drinks per day  . Drug use: No  . Sexual activity: Not on file  Other Topics Concern  . Not on file  Social History Narrative   Patient is married with 2 children, 3 grandchildren    Patient is retired Engineer, maintenance (IT)   Patient lives at [employer] [facility] [place] Strain: Not on [employer]  Insecurity: Not on file  Transportation Needs: Not on file  Physical Activity: Not on file  Stress: Not on file  Social Connections: Not on file  Intimate Partner Violence: Not on file    Functional Status Survey:    Family History  Problem Relation Age of Onset  . Stroke Father   . CVA Father   . Tuberculosis Father   . Neuropathy Neg Hx     Health Maintenance  Topic Date Due  . COVID-19 Vaccine (4 - Booster for Moderna series) 09/05/2022 (Originally 06/12/2021)  . TETANUS/TDAP  01/06/2022  . INFLUENZA VACCINE  02/04/2022  . Pneumonia Vaccine 24+ Years old  Completed  . Zoster Vaccines- Shingrix  Completed  . HPV VACCINES  Aged Out    Allergies  Allergen Reactions  .  Penicillins Rash    Has patient had a PCN reaction causing immediate rash, facial/tongue/throat swelling, SOB or lightheadedness with hypotension: unknown Has patient had a PCN reaction causing severe rash involving mucus membranes or skin necrosis: unknown Has patient had a PCN reaction that required hospitalization : unknown Has patient had a PCN reaction occurring within the last 10 years: no If all of the above answers are "NO", then may proceed with Cephalosporin use.     Allergies as of 12/20/2021      Reactions   Penicillins Rash   Has patient had a PCN reaction causing immediate rash, facial/tongue/throat swelling, SOB or lightheadedness with hypotension: unknown Has patient had a PCN reaction causing severe rash involving mucus membranes or skin necrosis: unknown Has patient had a PCN reaction that required hospitalization : unknown Has patient had a PCN reaction occurring within the last 10 years: no If all of the above answers are "NO", then may proceed with Cephalosporin use.      Medication List       Accurate as of December 20, 2021 11:59 PM. If you have any questions, ask your nurse or doctor.        acetaminophen 500 MG tablet Commonly known as: TYLENOL Take 500 mg by mouth 2 (two)  times daily.   atorvastatin 20 MG tablet Commonly known as: LIPITOR Take 20 mg by mouth every evening.   cholecalciferol 25 MCG (1000 UNIT) tablet Commonly known as: VITAMIN D3 Take 2,000 Units by mouth daily.   DULoxetine 20 MG capsule Commonly known as: CYMBALTA Take 20 mg by mouth daily.   furosemide 20 MG tablet Commonly known as: LASIX Take 2 tablets (40 mg total) by mouth daily. Take extra '20mg'$  for weight gain of 2-3lbs in 1 day or 5 lbs in 1 week   methenamine 1 g tablet Commonly known as: MANDELAMINE Take 1,000 mg by mouth 2 (two) times daily.   mupirocin ointment 2 % Commonly known as: BACTROBAN Apply 1 application. topically See admin instructions. Bid x  7 days   polyethylene glycol 17 g packet Commonly known as: MIRALAX / GLYCOLAX Take 17 g by mouth daily.   potassium chloride 10 MEQ CR capsule Commonly known as: MICRO-K Take 10 mEq by mouth every Tuesday, Thursday, and Saturday at 6 PM. What changed: Another medication with the same name was removed. Continue taking this medication, and follow the directions you see here. Changed by: [name], MD   triamcinolone lotion 0.1 % Commonly known as: KENALOG Apply 1 application. topically 2 (two) times daily as needed (rash on back).   warfarin 4 MG tablet Commonly known as: COUMADIN Take 1 tablet (4 mg total) by mouth every Monday, Wednesday, and Friday. Hold Coumadin for 2days, check INR 6/14 and resume '4mg'$  daily alternating with '5mg'$  daily, if INR in therapeutic range   warfarin 5 MG tablet Commonly known as: COUMADIN Take 1 tablet (5 mg total) by mouth See admin instructions. Hold Coumadin for 2 days and then resume on 6/14, as per previous regimen 4 Mg daily alternating with 5 Mg daily       Review of Systems  Vitals:   12/20/21 1030  BP: 116/70  Pulse: 80  Resp: 18  Temp: 97.6 F (36.4 C)  SpO2: 95%  Weight: 262 lb (118.8 kg)  Height: '6\' 7"'$  (2.007 m)   Body mass index is 29.52  kg/m. Physical Exam  Labs reviewed: Basic Metabolic Panel: Recent Labs    12/14/21  0131 12/15/21 0035 12/16/21 0146  NA 138 142 142  K 3.5 3.5 3.4*  CL 104 105 105  CO2 '26 26 27  '$ GLUCOSE 132* 129* 122*  BUN 50* 57* 55*  CREATININE 1.75* 1.72* 1.48*  CALCIUM 10.1 10.4* 10.7*    Liver Function Tests: Recent Labs    09/26/21 0000 12/08/21 1904 12/11/21 2010  AST 11* 14* 16  ALT 6* 8 8  ALKPHOS 100 101 82  BILITOT  --  1.6* 1.9*  PROT  --  6.4* 6.2*  ALBUMIN 4.0 3.6 3.4*    No results for input(s): "LIPASE", "AMYLASE" in the last 8760 hours. No results for input(s): "AMMONIA" in the last 8760 hours. CBC: Recent Labs    12/08/21 1904 12/09/21 0427 12/11/21 2010 12/12/21 0650 12/14/21 0131 12/15/21 0035 12/16/21 0146  WBC 4.7   < > 5.1   < > 4.1 4.0 3.4*  NEUTROABS 3.8  --  4.1  --   --   --   --   HGB 13.1   < > 13.2   < > 12.6* 12.7* 13.2  HCT 41.1   < > 43.1   < > 39.5 40.5 40.3  MCV 88.4   < > 90.5   < > 86.4 86.9 86.1  PLT 85*   < > 88*   < > 94* 95* 105*   < > = values in this interval not displayed.    Cardiac Enzymes: No results for input(s): "CKTOTAL", "CKMB", "CKMBINDEX", "TROPONINI" in the last 8760 hours. BNP: Invalid input(s): "POCBNP" Lab Results  Component Value Date   HGBA1C 6 08/22/2019   Lab Results  Component Value Date   TSH 0.84 05/19/2019   Lab Results  Component Value Date   VITAMINB12 217 04/19/2021   Lab Results  Component Value Date   FOLATE >20.0 11/05/2016   No results found for: "IRON", "TIBC", "FERRITIN"  Imaging and Procedures obtained prior to SNF admission: DG Chest 2 View  Result Date: 12/12/2021 CLINICAL DATA:  Shortness of breath history of hypertension and AFib. EXAM: CHEST - 2 VIEW COMPARISON:  Radiograph December 11, 2021 FINDINGS: Similar enlarged cardiac silhouette. Unchanged cardiomediastinal contours. Aortic atherosclerosis. No overt pulmonary edema or focal airspace consolidation. Trace bilateral pleural  effusions. Thoracic spondylosis. IMPRESSION: Enlarged cardiac silhouette and central vascular prominence without overt pulmonary edema. Trace bilateral pleural effusions. Electronically Signed   By: Dahlia Bailiff M.D.   On: 12/12/2021 11:47   DG Chest Portable 1 View  Result Date: 12/11/2021 CLINICAL DATA:  eval for sob EXAM: PORTABLE CHEST 1 VIEW COMPARISON:  Chest x-ray 12/08/2021 FINDINGS: Persistent cardiomegaly. The heart and mediastinal contours are unchanged. Aortic calcifications No focal consolidation. No pulmonary edema. No pleural effusion. No pneumothorax. No acute osseous abnormality. IMPRESSION: 1. Cardiomegaly with no active disease. Underlying pericardial effusion not excluded on AP portable technique. 2.  Aortic Atherosclerosis (ICD10-I70.0). Electronically Signed   By: Iven Finn M.D.   On: 12/11/2021 23:45    Assessment/Plan There are no diagnoses linked to this encounter.   Family/ staff Communication:   Labs/tests ordered:   This encounter was created in error - please disregard. This encounter was created in error - please disregard.

## 2021-12-20 NOTE — Progress Notes (Signed)
[facility] Consult Note Telephone: [phone]  Fax: [phone]   Date of encounter: 12/20/21 10:40 AM PATIENT NAME: Peter Singh by [name]" [address] [place] [address]   [phone] (home) [phone] (work) DOB: 10-May-1932 MRN: [id number] PRIMARY CARE PROVIDER:    [name], MD,  [place] [address] [phone]  REFERRING PROVIDER:   [name], MD [address] [place],  [place] [address] [phone]  RESPONSIBLE PARTY:    Contact Information     Name Relation Home Work Mobile   Singh,Peter Spouse [phone]  [phone]   Peter Singh Daughter   [phone]   Peter, Singh [phone]  [phone]        I met face to face with patient and spouse in the skilled nursing facility. Palliative Care was asked to follow this patient by consultation request of  [name], MD to address advance care planning and complex medical decision making. This is the initial visit.          ASSESSMENT, SYMPTOM MANAGEMENT AND PLAN / RECOMMENDATIONS:   Supratherapeutic INR Last INR was elevated at 4.63 on 12/18/21.  Coumadin is being held. Repeated INR today with no results yet but continue to monitor.   Longstanding atrial fibrillation/chronic anticoagulation Rate controlled off medication, continue to monitor. INR goal of 2-3.0, can resume Coumadin at lower dose.  Follow up Palliative Care Visit: Palliative care will continue to be available to follow for complex medical decision making. Return prn when requested by family or facility.    This visit was coded based on medical decision making (MDM).  PPS: 60%  HOSPICE ELIGIBILITY/DIAGNOSIS: TBD  Chief Complaint:  [facility] received a referral to follow up with patient for chronic disease management of chronic CHF in setting of dementia.  HISTORY OF PRESENT  ILLNESS:  Peter Singh is a 88 y.o. year old male with vascular dementia, had recent hypoxic respiratory failure secondary to acute CHF exacerbation. He had elevated troponins with possible type 2 NSTEMI due to demand ischemia from volume overload.  He also has PVD and atrial fibrillation, constipation, urinary retention with BPH now s/p SP cath changed monthly on the 2nd of each month, thrombocytopenia, HLD, a right renal cancer, CKD stage 3, hx of CVA, PAH, OA, hx of melanoma and MRSA + culture.  He is currently on contact precautions at the SNF.  Denies CP, SOB, DOE, nausea, vomiting, epistaxis or blood in stool.  Denies needing assistance with ADLs but wife and staff states he needs minimal assistance to put on shoes and TED hose.  Nursing staff also indicates that since hospitalization he has some DOE when walking from his room to the dining room. Nurse Peter Singh states that his Coumadin has been held since he got back from the hospital due to supratherapeutic INR which on 12/18/21 was 4.63.  Repeat INR was done today but results were not available at the time of the visit. He uses a rollator walker to aid ambulation.  Denies orthopnea, PND, no falls, eating well.   Pt and spouse are happy with care at [facility] and requested that instead of seeing pt monthly they would call or the staff would call if they wanted [facility] to make a prn visit.  Labs 12/16/21: BMP remarkable for low potassium 3.4 and elevated glucose 122, Cr 1.48, BUN 55, eGFR 46 and Calcium 10.7. Troponin I [phone] CBC remarkable for low WBC  3.4 and PLT 105 INR 4.8  Echo 12/09/21: Study Result      ECHOCARDIOGRAM REPORT         Patient Name:   Peter Singh Date of Exam: 12/09/2021  Medical Rec #:  [id number]        Height:       79.0 in  Accession #:    [id number]       Weight:       265.0 lb  Date of Birth:  [birth year]        BSA:          2.568 m  Patient Age:    88 years         BP:           120/78 mmHg  Patient  Gender: M                HR:           40 bpm.  Exam Location:  Inpatient   Procedure: 2D Echo, Color Doppler, Cardiac Doppler and Intracardiac             Opacification Agent   Indications:    CHF-Acute Diastolic Y33.38     History:        Patient has prior history of Echocardiogram examinations,  most                  recent 09/06/2014. PAD, Arrythmias:Atrial Fibrillation,                  Signs/Symptoms:Dementia; Risk Factors:Hypertension and                  Dyslipidemia. Chest pain and hypoxia.     Sonographer:    Darlina Sicilian RDCS  Referring Phys: [id number] [place]     1. Left ventricular ejection fraction, by estimation, is 50 to 55%. The  left ventricle has low normal function. The left ventricle has no regional  wall motion abnormalities. There is severe concentric left ventricular  hypertrophy. Left ventricular  diastolic parameters are consistent with Grade III diastolic dysfunction  (restrictive).   2. Right ventricular systolic function is mildly reduced. The right  ventricular size is mildly enlarged. There is moderately elevated  pulmonary artery systolic pressure.   3. Left atrial size was severely dilated.   History obtained from review of EMR, discussion with wife, facility staff and/or [name].  I reviewed available labs, medications, imaging, studies and related documents from the EMR.  Records reviewed and summarized above.   ROS General: NAD ENMT: denies dysphagia Cardiovascular: denies chest pain, denies DOE, no PND/orthopnea Pulmonary: denies cough, denies increased SOB Abdomen: endorses good appetite, denies constipation, endorses continence of bowel GU: SP cath MSK:  denies increased weakness, no falls reported Skin: denies rashes or wounds Neurological: denies pain, denies insomnia Psych: Endorses positive mood Heme/lymph/immuno: denies bruises, abnormal bleeding  Physical Exam: Current and past weights: 246.2  lbs Constitutional: NAD General: WD/WN  CV: S1S2, RRR, no LE edema (trace edema of ankles) Pulmonary: CTAB, no increased work of breathing, no cough, room air Abdomen: normo-active BS + 4 quadrants, soft and non tender, no ascites GU: deferred MSK: no sarcopenia, moves all extremities, ambulatory with rollator Skin: warm and dry, no rashes or wounds on visible skin Neuro:  no generalized weakness, noted cognitive impairment Psych: non-anxious affect, A and O x 3 Hem/lymph/immuno: no widespread bruising  CURRENT PROBLEM LIST:  Patient Active  Problem List   Diagnosis Date Noted   NSTEMI (non-ST elevated myocardial infarction) ([place]) 12/09/2021   Acute respiratory failure with hypoxia ([place]) 12/08/2021   UTI (urinary tract infection) 12/08/2021   Cyanosis of fingertip 11/28/2021   Slow transit constipation 04/20/2020   MRSA (methicillin resistant staph aureus) culture positive 11/17/2019   Suprapubic catheter ([place]) 10/30/2019   Peripheral vascular disease ([place]) 09/02/2019   Knee pain 06/24/2019   Vascular dementia without behavioral disturbance ([place]) 05/07/2019   Other neutropenia ([place]) 11/16/2018   Chronic diastolic heart failure ([place]) 06/08/2018   Encounter for therapeutic drug monitoring 03/19/2017   Urinary retention due to benign prostatic hyperplasia 10/14/2016   Renal mass, right 10/14/2016   Dementia without behavioral disturbance ([place]) 10/14/2016   Thrombocytopenia ([place]) 10/07/2016   Hereditary and idiopathic peripheral neuropathy 07/06/2014   Essential hypertension 03/24/2014   Hyperlipidemia 03/24/2014   Chronic anticoagulation 03/24/2014   Atrial fibrillation ([place]) 05/17/2013   PAST MEDICAL HISTORY:  Active Ambulatory Problems    Diagnosis Date Noted   Atrial fibrillation ([place]) 05/17/2013   Essential hypertension 03/24/2014   Hyperlipidemia 03/24/2014   Chronic anticoagulation 03/24/2014   Hereditary and idiopathic peripheral neuropathy 07/06/2014    Thrombocytopenia ([place]) 10/07/2016   Urinary retention due to benign prostatic hyperplasia 10/14/2016   Renal mass, right 10/14/2016   Dementia without behavioral disturbance ([place]) 10/14/2016   Encounter for therapeutic drug monitoring 03/19/2017   Chronic diastolic heart failure ([place]) 06/08/2018   Other neutropenia ([place]) 11/16/2018   Vascular dementia without behavioral disturbance ([place]) 05/07/2019   Knee pain 06/24/2019   Peripheral vascular disease ([place]) 09/02/2019   Suprapubic catheter ([place]) 10/30/2019   MRSA (methicillin resistant staph aureus) culture positive 11/17/2019   Slow transit constipation 04/20/2020   Cyanosis of fingertip 11/28/2021   Acute respiratory failure with hypoxia ([place]) 12/08/2021   UTI (urinary tract infection) 12/08/2021   NSTEMI (non-ST elevated myocardial infarction) ([place]) 12/09/2021   Resolved Ambulatory Problems    Diagnosis Date Noted   Cognitive changes 07/30/2014   Sepsis ([place]) 03/11/2016   Sepsis secondary to UTI ([place]) 10/07/2016   Delirium superimposed on dementia 10/14/2016   Acute renal failure ([place]) 10/14/2016   Bacteriuria 05/07/2019   Ulcer of right foot, limited to breakdown of skin ([place]) 08/18/2019   Past Medical History:  Diagnosis Date   Adult failure to thrive    Allergic rhinitis    BPH (benign prostatic hyperplasia)    Cancer of kidney ([place])    Cataract    Cerebrovascular accident, late effects    Chronic kidney disease, stage III (moderate) ([place])    Claudication ([place])    Cognitive impairment    Constipation    Dementia ([place])    Diarrhea    Diastolic dysfunction    Diverticulosis    Diverticulosis of colon (without mention of hemorrhage)    ED (erectile dysfunction)    History of Coumadin therapy    History of echocardiogram 09/2014   Hyperlipemia    Hypertension    Kidney mass    Lower leg edema    Melanoma (HCC)    Neuropathy    OA (osteoarthritis)    Peripheral neuropathy    Personal history of colonic polyps  1999 & 2004   Pulmonary arterial hypertension ([place])    Rosacea    Ventricular hypertrophy    Walker as ambulation aid    SOCIAL HX:  Social History   Tobacco Use   Smoking status: Former    Packs/day: 3.00  Types: Cigarettes    Quit date: 06/09/1975    Years since quitting: 46.5   Smokeless tobacco: Never  Substance Use Topics   Alcohol use: Yes    Alcohol/week: 0.0 standard drinks of alcohol    Comment: 2-3 drinks per day   FAMILY HX:  Family History  Problem Relation Age of Onset   Stroke Father    CVA Father    Tuberculosis Father    Neuropathy Neg Hx      Does not want to go to the hospital again.   Preferred Pharmacy: ALLERGIES:  Allergies  Allergen Reactions   Penicillins Rash    Has patient had a PCN reaction causing immediate rash, facial/tongue/throat swelling, SOB or lightheadedness with hypotension: unknown Has patient had a PCN reaction causing severe rash involving mucus membranes or skin necrosis: unknown Has patient had a PCN reaction that required hospitalization : unknown Has patient had a PCN reaction occurring within the last 10 years: no If all of the above answers are "NO", then may proceed with Cephalosporin use.      PERTINENT MEDICATIONS:  Outpatient Encounter Medications as of 12/20/2021  Medication Sig   acetaminophen (TYLENOL) 500 MG tablet Take 500 mg by mouth 2 (two) times daily.   atorvastatin (LIPITOR) 20 MG tablet Take 20 mg by mouth every evening.   cholecalciferol (VITAMIN D3) 25 MCG (1000 UT) tablet Take 2,000 Units by mouth daily.    DULoxetine (CYMBALTA) 20 MG capsule Take 20 mg by mouth daily.   furosemide (LASIX) 20 MG tablet Take 2 tablets (40 mg total) by mouth daily. Take extra 38m for weight gain of 2-3lbs in 1 day or 5 lbs in 1 week   methenamine (MANDELAMINE) 1 g tablet Take 1,000 mg by mouth 2 (two) times daily.   mupirocin ointment (BACTROBAN) 2 % Apply 1 application. topically See admin instructions. Bid x  7 days    polyethylene glycol (MIRALAX / GLYCOLAX) 17 g packet Take 17 g by mouth daily.   triamcinolone lotion (KENALOG) 0.1 % Apply 1 application. topically 2 (two) times daily as needed (rash on back).   warfarin (COUMADIN) 4 MG tablet Take 1 tablet (4 mg total) by mouth every Monday, Wednesday, and Friday. Hold Coumadin for 2days, check INR 6/14 and resume 447mdaily alternating with 43m443maily, if INR in therapeutic range   warfarin (COUMADIN) 5 MG tablet Take 1 tablet (5 mg total) by mouth See admin instructions. Hold Coumadin for 2 days and then resume on 6/14, as per previous regimen 4 Mg daily alternating with 5 Mg daily   Facility-Administered Encounter Medications as of 12/20/2021  Medication   mupirocin ointment (BACTROBAN) 2 %     -------------------------------------------------------------------------------------------------------------------------------------------------------------------------------------------------------------------------------------------- Advance Care Planning/Goals of Care: Goals include to maximize quality of life and symptom management. Patient/health care POA (wife-Peter): Do not want pt to be transported to the hospital.  CODE STATUS: MOST as of 07/22/2019: DNR/DNI with limited intervention Antibiotics and IV fluids on case by case time limited basis No feeding tube   Thank you for the opportunity to participate in the care of Mr. CleColelloThe palliative care team will continue to follow. Please call our office at [phone] we can be of additional assistance.   KarMarijo ConceptionNP-C  COVID-19 PATIENT SCREENING TOOL Asked and negative response unless otherwise noted:  Have you had symptoms of covid, tested positive or been in contact with someone with symptoms/positive test in the past 5-10 days? no

## 2021-12-23 ENCOUNTER — Encounter: Payer: Self-pay | Admitting: Nurse Practitioner

## 2021-12-23 ENCOUNTER — Ambulatory Visit (INDEPENDENT_AMBULATORY_CARE_PROVIDER_SITE_OTHER): Payer: Medicare Other | Admitting: Nurse Practitioner

## 2021-12-23 ENCOUNTER — Encounter: Payer: Self-pay | Admitting: Internal Medicine

## 2021-12-23 ENCOUNTER — Non-Acute Institutional Stay (SKILLED_NURSING_FACILITY): Payer: Medicare Other | Admitting: Internal Medicine

## 2021-12-23 VITALS — BP 98/60 | HR 62 | Ht 79.0 in | Wt 247.0 lb

## 2021-12-23 DIAGNOSIS — I4811 Longstanding persistent atrial fibrillation: Secondary | ICD-10-CM

## 2021-12-23 DIAGNOSIS — I2510 Atherosclerotic heart disease of native coronary artery without angina pectoris: Secondary | ICD-10-CM

## 2021-12-23 DIAGNOSIS — Z9359 Other cystostomy status: Secondary | ICD-10-CM

## 2021-12-23 DIAGNOSIS — D696 Thrombocytopenia, unspecified: Secondary | ICD-10-CM | POA: Diagnosis not present

## 2021-12-23 DIAGNOSIS — I10 Essential (primary) hypertension: Secondary | ICD-10-CM | POA: Diagnosis not present

## 2021-12-23 DIAGNOSIS — I5032 Chronic diastolic (congestive) heart failure: Secondary | ICD-10-CM | POA: Diagnosis not present

## 2021-12-23 DIAGNOSIS — F0150 Vascular dementia without behavioral disturbance: Secondary | ICD-10-CM

## 2021-12-23 DIAGNOSIS — I5033 Acute on chronic diastolic (congestive) heart failure: Secondary | ICD-10-CM

## 2021-12-23 DIAGNOSIS — Z7901 Long term (current) use of anticoagulants: Secondary | ICD-10-CM

## 2021-12-23 NOTE — Patient Instructions (Signed)
Medication Instructions:   DISCONTINUE Atorvastatin.  *If you need a refill on your cardiac medications before your next appointment, please call your pharmacy*   Lab Work:  None ordered.  If you have labs (blood work) drawn today and your tests are completely normal, you will receive your results only by: [place] (if you have MyChart) OR A paper copy in the mail If you have any lab test that is abnormal or we need to change your treatment, we will call you to review the results.   Testing/Procedures:   None ordered.   Follow-Up: At [facility], you and your health needs are our priority.  As part of our continuing mission to provide you with exceptional heart care, we have created designated Provider Care Teams.  These Care Teams include your primary Cardiologist (physician) and Advanced Practice Providers (APPs -  Physician Assistants and Nurse Practitioners) who all work together to provide you with the care you need, when you need it.  We recommend signing up for the patient portal called "MyChart".  Sign up information is provided on this After Visit Summary.  MyChart is used to connect with patients for Virtual Visits (Telemedicine).  Patients are able to view lab/test results, encounter notes, upcoming appointments, etc.  Non-urgent messages can be sent to your provider as well.   To learn more about what you can do with MyChart, go to [email or web address].    Your next appointment:   1 year(s)  The format for your next appointment:   In Person  Provider:   Sinclair Grooms, MD     Other Instructions  Your physician wants you to follow-up in: 1 year with Dr.Smith.  You will receive a reminder letter in the mail two months in advance. If you don't receive a letter, please call our office to schedule the follow-up appointment.   Important Information About Sugar

## 2021-12-23 NOTE — Progress Notes (Unsigned)
Provider:  Veleta Miners MD Location:    [facility] Room Number: 376 Place of Service:  SNF ([phone])  PCP: [name], MD [name], [name] [name], MD as PCP - Cardiology (Cardiology)  Extended Emergency Contact Information Primary Emergency Contact: Picco,[name] Address: [address]          [place], [place] [address] [name] Phone: [phone] Mobile Phone: [phone] Relation: Spouse Secondary Emergency Contact: [name],[name] Address: [address]          [place], [place] [address] [place] Mobile Phone: [phone] Relation: Daughter  Code Status: DNR Palliative Care Goals of Care: Advanced Directive information    12/23/2021   10:11 AM  Advanced Directives  Does Patient Have a Medical Advance Directive? Yes  Type of Advance Directive Out of facility DNR (pink MOST or yellow form);Living will  Does patient want to make changes to medical advance directive? No - Patient declined  Pre-existing out of facility DNR order (yellow form or pink MOST form) Pink MOST form placed in chart (order not valid for inpatient use);Yellow form placed in chart (order not valid for inpatient use)      Chief Complaint  Patient presents with   Readmit To SNF    Readmission to SNF    HPI: Patient is a 88 y.o. male seen today for admission to  Past Medical History:  Diagnosis Date   Adult failure to thrive    Per incoming records from [place]   Allergic rhinitis    Per incoming records from [facility]   Atrial fibrillation [facility])    BPH (benign prostatic hyperplasia)    Per incoming records from [place] of kidney [facility])    Right, Per incoming records from [name]    Per incoming records from [facility]    Cerebrovascular accident, late effects    Per incoming records from [place]   Chronic kidney disease, stage III (moderate) ([place])    Per incoming records from [facility])    Per incoming records from [facility]   Cognitive impairment    Mild, Per incoming records from [facility]   Constipation    Per incoming records from [facility]   Dementia [facility])    Diarrhea    Better off Aricept, Per incoming records from [facility]   Diastolic dysfunction    on 2D Echo 07/2009 and 2016, Per incoming records from [facility]   Diverticulosis    Mild, Per incoming records from [facility]   Diverticulosis of colon (without mention of hemorrhage)    ED (erectile dysfunction)    Per incoming records from [facility]   History of Coumadin therapy    Per incoming records from [facility]   History of echocardiogram 09/2014   Per incoming records from [facility]   Hyperlipemia    Hypertension    Kidney mass    Per incoming records from [place] leg edema    Per incoming records from [facility])    Neuropathy    Per incoming records from [facility]   OA (osteoarthritis)    Per incoming records from [facility]  Peripheral neuropathy    Per incoming records from [place] history of colonic polyps 1999 & 2004   adenomatous polyps   Pulmonary arterial hypertension ([place])    Per incoming records from [place]    Per incoming records from [facility]   Thrombocytopenia [facility]) 2017   Per incoming records from [facility]   UTI (urinary tract infection)    Sepsis, Per incoming records from [place] as ambulation aid    Per incoming records from [facility]   Past Surgical History:  Procedure Laterality Date   APPENDECTOMY     Per incoming records from [place]     Per incoming records from [place]  11/21/2019   KNEE SURGERY     right   PILONIDAL CYST DRAINAGE     POLYPECTOMY     Per incoming records from [place] tumor lumbar spine     SPINE SURGERY     tumor removed   THORACIC 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, Per incoming records from [place]      reports that he quit smoking about 46 years ago. His smoking use included cigarettes. He smoked an average of 3 packs per day. He has never used smokeless tobacco. He reports current alcohol use. He reports that he does not use drugs. Social History   Socioeconomic History   Marital status: Married    Spouse name: Not on file   Number of children: 2   Years of education: Not on file   Highest education level: Bachelor's degree (e.g., BA, AB, BS)  Occupational History   Occupation: retired  Tobacco Use   Smoking status: Former    Packs/day: 3.00    Types: Cigarettes    Quit date: 06/09/1975    Years since quitting: 46.5   Smokeless tobacco: Never  Vaping Use   Vaping Use: Never used  Substance and Sexual Activity   Alcohol use: Yes    Alcohol/week: 0.0 standard drinks of alcohol    Comment: 2-3 drinks per day   Drug use: No   Sexual activity: Not on file  Other Topics Concern   Not on file  Social History Narrative   Patient is married with 2 children, 3 grandchildren    Patient is retired Engineer, maintenance (IT)   Patient lives at [place] Strain: Not on file  Food Insecurity: Not on file  Transportation Needs: Not on file   Physical Activity: Not on file  Stress: Not on file  Social Connections: Not on file  Intimate Partner Violence: Not on file    Functional Status Survey:    Family History  Problem Relation Age of Onset   Stroke Father    CVA Father    Tuberculosis Father    Neuropathy Neg Hx     Health Maintenance  Topic Date Due   COVID-19 Vaccine (4 - Booster for Moderna series) 09/05/2022 (Originally 06/12/2021)   TETANUS/TDAP  01/06/2022   INFLUENZA VACCINE  02/04/2022   Pneumonia Vaccine 30+ Years old  Completed   Zoster Vaccines- Shingrix  Completed   HPV VACCINES  Aged Out    Allergies  Allergen Reactions  Penicillins Rash    Has patient had a PCN reaction causing immediate rash, facial/tongue/throat swelling, SOB or lightheadedness with hypotension: unknown Has patient had a PCN reaction causing severe rash involving mucus membranes or skin necrosis: unknown Has patient had a PCN reaction that required hospitalization : unknown Has patient had a PCN reaction occurring within the last 10 years: no If all of the above answers are "NO", then may proceed with Cephalosporin use.     Allergies as of 12/23/2021       Reactions   Penicillins Rash   Has patient had a PCN reaction causing immediate rash, facial/tongue/throat swelling, SOB or lightheadedness with hypotension: unknown Has patient had a PCN reaction causing severe rash involving mucus membranes or skin necrosis: unknown Has patient had a PCN reaction that required hospitalization : unknown Has patient had a PCN reaction occurring within the last 10 years: no If all of the above answers are "NO", then may proceed with Cephalosporin use.        Medication List        Accurate as of December 23, 2021 10:11 AM. If you have any questions, ask your nurse or doctor.          acetaminophen 500 MG tablet Commonly known as: TYLENOL Take 500 mg by mouth 2 (two) times daily.   atorvastatin 20 MG tablet Commonly known  as: LIPITOR Take 20 mg by mouth daily. What changed: Another medication with the same name was removed. Continue taking this medication, and follow the directions you see here. Changed by: Emmaline Life, NP   cholecalciferol 25 MCG (1000 UNIT) tablet Commonly known as: VITAMIN D3 Take 2,000 Units by mouth daily.   DULoxetine 20 MG capsule Commonly known as: CYMBALTA Take 20 mg by mouth daily.   furosemide 20 MG tablet Commonly known as: LASIX Take 2 tablets (40 mg total) by mouth daily. Take extra '20mg'$  for weight gain of 2-3lbs in 1 day or 5 lbs in 1 week   methenamine 1 g tablet Commonly known as: MANDELAMINE Take 1,000 mg by mouth 2 (two) times daily.   mupirocin ointment 2 % Commonly known as: BACTROBAN Apply 1 application. topically See admin instructions. Bid x  7 days   polyethylene glycol 17 g packet Commonly known as: MIRALAX / GLYCOLAX Take 17 g by mouth daily.   potassium chloride 10 MEQ CR capsule Commonly known as: MICRO-K Take 10 mEq by mouth every Tuesday, Thursday, and Saturday at 6 PM.   triamcinolone lotion 0.1 % Commonly known as: KENALOG Apply 1 application. topically 2 (two) times daily as needed (rash on back).   warfarin 4 MG tablet Commonly known as: COUMADIN Take 1 tablet (4 mg total) by mouth every Monday, Wednesday, and Friday. Hold Coumadin for 2days, check INR 6/14 and resume '4mg'$  daily alternating with '5mg'$  daily, if INR in therapeutic range   warfarin 5 MG tablet Commonly known as: COUMADIN Take 1 tablet (5 mg total) by mouth See admin instructions. Hold Coumadin for 2 days and then resume on 6/14, as per previous regimen 4 Mg daily alternating with 5 Mg daily        Review of Systems  Vitals:   12/23/21 1001  BP: 116/70  Pulse: 80  Resp: 18  Temp: 97.6 F (36.4 C)  SpO2: 95%  Weight: 262 lb (118.8 kg)  Height: '6\' 7"'$  (2.007 m)   Body mass index is 29.52 kg/m. Physical Exam  Labs reviewed: Basic Metabolic  Panel:  Recent Labs    12/14/21 0131 12/15/21 0035 12/16/21 0146  NA 138 142 142  K 3.5 3.5 3.4*  CL 104 105 105  CO2 '26 26 27  '$ GLUCOSE 132* 129* 122*  BUN 50* 57* 55*  CREATININE 1.75* 1.72* 1.48*  CALCIUM 10.1 10.4* 10.7*   Liver Function Tests: Recent Labs    09/26/21 0000 12/08/21 1904 12/11/21 2010  AST 11* 14* 16  ALT 6* 8 8  ALKPHOS 100 101 82  BILITOT  --  1.6* 1.9*  PROT  --  6.4* 6.2*  ALBUMIN 4.0 3.6 3.4*   No results for input(s): "LIPASE", "AMYLASE" in the last 8760 hours. No results for input(s): "AMMONIA" in the last 8760 hours. CBC: Recent Labs    12/08/21 1904 12/09/21 0427 12/11/21 2010 12/12/21 0650 12/14/21 0131 12/15/21 0035 12/16/21 0146  WBC 4.7   < > 5.1   < > 4.1 4.0 3.4*  NEUTROABS 3.8  --  4.1  --   --   --   --   HGB 13.1   < > 13.2   < > 12.6* 12.7* 13.2  HCT 41.1   < > 43.1   < > 39.5 40.5 40.3  MCV 88.4   < > 90.5   < > 86.4 86.9 86.1  PLT 85*   < > 88*   < > 94* 95* 105*   < > = values in this interval not displayed.   Cardiac Enzymes: No results for input(s): "CKTOTAL", "CKMB", "CKMBINDEX", "TROPONINI" in the last 8760 hours. BNP: Invalid input(s): "POCBNP" Lab Results  Component Value Date   HGBA1C 6 08/22/2019   Lab Results  Component Value Date   TSH 0.84 05/19/2019   Lab Results  Component Value Date   VITAMINB12 217 04/19/2021   Lab Results  Component Value Date   FOLATE >20.0 11/05/2016   No results found for: "IRON", "TIBC", "FERRITIN"  Imaging and Procedures obtained prior to SNF admission: DG Chest 2 View  Result Date: 12/12/2021 CLINICAL DATA:  Shortness of breath history of hypertension and AFib. EXAM: CHEST - 2 VIEW COMPARISON:  Radiograph December 11, 2021 FINDINGS: Similar enlarged cardiac silhouette. Unchanged cardiomediastinal contours. Aortic atherosclerosis. No overt pulmonary edema or focal airspace consolidation. Trace bilateral pleural effusions. Thoracic spondylosis. IMPRESSION: Enlarged cardiac  silhouette and central vascular prominence without overt pulmonary edema. Trace bilateral pleural effusions. Electronically Signed   By: Dahlia Bailiff M.D.   On: 12/12/2021 11:47   DG Chest Portable 1 View  Result Date: 12/11/2021 CLINICAL DATA:  eval for sob EXAM: PORTABLE CHEST 1 VIEW COMPARISON:  Chest x-ray 12/08/2021 FINDINGS: Persistent cardiomegaly. The heart and mediastinal contours are unchanged. Aortic calcifications No focal consolidation. No pulmonary edema. No pleural effusion. No pneumothorax. No acute osseous abnormality. IMPRESSION: 1. Cardiomegaly with no active disease. Underlying pericardial effusion not excluded on AP portable technique. 2.  Aortic Atherosclerosis (ICD10-I70.0). Electronically Signed   By: Iven Finn M.D.   On: 12/11/2021 23:45    Assessment/Plan There are no diagnoses linked to this encounter.   Family/ staff Communication:   Labs/tests ordered:

## 2021-12-24 DIAGNOSIS — I5032 Chronic diastolic (congestive) heart failure: Secondary | ICD-10-CM | POA: Diagnosis not present

## 2021-12-24 LAB — COMPREHENSIVE METABOLIC PANEL: Calcium: 10.4 (ref 8.7–10.7)

## 2021-12-24 LAB — BASIC METABOLIC PANEL
BUN: 20 (ref 4–21)
CO2: 25 — AB (ref 13–22)
Chloride: 105 (ref 99–108)
Creatinine: 1.2 (ref 0.6–1.3)
Glucose: 119
Potassium: 4.5 mEq/L (ref 3.5–5.1)
Sodium: 142 (ref 137–147)

## 2022-01-03 DIAGNOSIS — Z7901 Long term (current) use of anticoagulants: Secondary | ICD-10-CM | POA: Diagnosis not present

## 2022-01-03 LAB — POCT INR: INR: 2.44 — AB (ref 0.80–1.20)

## 2022-01-08 ENCOUNTER — Encounter: Payer: Self-pay | Admitting: Orthopedic Surgery

## 2022-01-08 ENCOUNTER — Non-Acute Institutional Stay (SKILLED_NURSING_FACILITY): Payer: Medicare Other | Admitting: Orthopedic Surgery

## 2022-01-08 DIAGNOSIS — F0150 Vascular dementia without behavioral disturbance: Secondary | ICD-10-CM

## 2022-01-08 DIAGNOSIS — N99521 Infection of other external stoma of urinary tract: Secondary | ICD-10-CM

## 2022-01-08 DIAGNOSIS — Z9359 Other cystostomy status: Secondary | ICD-10-CM

## 2022-01-08 NOTE — Progress Notes (Signed)
Location:  [facility] Room Number: 142/A Place of Service:  SNF (31) Provider: Yvonna Alanis, NP   [name], [name] [name], MD as PCP - Cardiology (Cardiology)  Extended Emergency Contact Information Primary Emergency Contact: Gothard,[name] Address: [address]          [place], [place] [address] [name] Phone: [phone] Mobile Phone: [phone] Relation: Spouse Secondary Emergency Contact: [name],[name] Address: [address]          [place], [place] [address] [place] Mobile Phone: [phone] Relation: Daughter  Code Status:  DNR Goals of care: Advanced Directive information    01/08/2022    2:54 PM  Advanced Directives  Does Patient Have a Medical Advance Directive? Yes  Type of Advance Directive Out of facility DNR (pink MOST or yellow form);Living will  Does patient want to make changes to medical advance directive? No - Patient declined  Pre-existing out of facility DNR order (yellow form or pink MOST form) Pink MOST form placed in chart (order not valid for inpatient use);Yellow form placed in chart (order not valid for inpatient use)     Chief Complaint  Patient presents with   Acute Visit    Urine odor     HPI:  Pt is a 88 y.o. male seen today for an acute visit for suprapubic stoma discoloration.   H/o urinary retention s/p suprapubic catheter. 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 07/04 nursing noticed increased erythema to suprapubic stoma. In addition, stoma appears to have small area of black discoloration at 3 o'clock. He is a poor historian due to dementia. Denies pain today.    Past Medical History:  Diagnosis Date   Adult failure to thrive    Per incoming records from [facility]   Allergic rhinitis    Per incoming records from [facility]    Atrial fibrillation [facility])    BPH (benign prostatic hyperplasia)    Per incoming records from [place] of kidney [facility])    Right, Per incoming records from [place]    Per incoming records from [facility]   Cerebrovascular accident, late effects    Per incoming records from [place] kidney disease, stage III (moderate) ([place])    Per incoming records from [facility])    Per incoming records from [facility]   Cognitive impairment    Mild, Per incoming records from [facility]   Constipation    Per incoming records from [facility]   Dementia [facility])    Diarrhea    Better off Aricept, Per incoming records from [facility]   Diastolic dysfunction    on 2D Echo 07/2009 and 2016, Per incoming records from [facility]   Diverticulosis    Mild, Per incoming records from [facility]   Diverticulosis of colon (without mention of hemorrhage)    ED (erectile dysfunction)    Per incoming records from [facility]   History of Coumadin therapy    Per incoming records from [facility]   History of echocardiogram 09/2014   Per incoming records from [facility]   Hyperlipemia    Hypertension    Kidney mass    Per incoming records from [facility]   Lower leg edema    Per incoming records from [facility])    Neuropathy    Per incoming records from [facility]   OA (osteoarthritis)    Per incoming records from [facility]   Peripheral neuropathy    Per incoming records from [place] history of colonic [place] & 2004   adenomatous polyps   Pulmonary arterial hypertension ([place])    Per incoming records  from [place]    Per incoming records from [facility]   Thrombocytopenia [facility]) 2017   Per incoming records from [facility]   UTI (urinary tract infection)    Sepsis, Per incoming records from [place] as ambulation aid    Per incoming records from [facility]   Past Surgical History:  Procedure Laterality Date   APPENDECTOMY     Per incoming records from [place]     Per incoming records from [place]  11/21/2019   KNEE SURGERY     right   PILONIDAL CYST DRAINAGE     POLYPECTOMY     Per incoming records from [place] tumor lumbar spine     SPINE SURGERY     tumor removed   THORACIC 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, Per incoming records from [place]      Allergies  Allergen Reactions   Penicillins Rash    Has patient had a PCN reaction causing immediate rash, facial/tongue/throat swelling, SOB or lightheadedness with hypotension: unknown Has patient had a PCN reaction causing severe rash involving mucus membranes or skin necrosis: unknown Has patient had a PCN reaction that required hospitalization : unknown Has patient had a PCN reaction occurring within the last 10 years: no If all of the above answers are "NO", then may proceed with Cephalosporin use.     Outpatient Encounter Medications as of 01/08/2022  Medication Sig   acetaminophen (TYLENOL) 500 MG tablet Take 500 mg by mouth 2 (two) times daily.   cholecalciferol (VITAMIN D3) 25 MCG (1000 UT) tablet Take 2,000 Units by mouth daily.    DULoxetine (CYMBALTA) 20 MG capsule Take 20 mg by mouth daily.   furosemide (LASIX) 20 MG tablet Take 2 tablets (40 mg total) by mouth daily. Take extra '20mg'$   for weight gain of 2-3lbs in 1 day or 5 lbs in 1 week   methenamine (MANDELAMINE) 1 g tablet Take 1,000 mg by mouth 2 (two) times daily.   polyethylene glycol (MIRALAX / GLYCOLAX) 17 g packet Take 17 g by mouth daily.   potassium chloride (MICRO-K) 10 MEQ CR capsule Take 10 mEq by mouth every Tuesday, Thursday, and Saturday at 6 PM.   triamcinolone lotion (KENALOG) 0.1 % Apply 1 application. topically 2 (two) times daily as needed (rash on back).   warfarin (COUMADIN) 4 MG tablet Take 1 tablet (4 mg total) by mouth every Monday, Wednesday, and Friday. Hold Coumadin for 2days, check INR 6/14 and resume '4mg'$  daily alternating with '5mg'$  daily, if INR in therapeutic range   warfarin (COUMADIN) 5 MG tablet Take 1 tablet (5 mg total) by mouth See admin instructions. Hold Coumadin for 2 days and then resume on 6/14,  as per previous regimen 4 Mg daily alternating with 5 Mg daily   [DISCONTINUED] mupirocin ointment (BACTROBAN) 2 % Apply 1 application. topically See admin instructions. Bid x  7 days   [DISCONTINUED] mupirocin ointment (BACTROBAN) 2 %    No facility-administered encounter medications on file as of 01/08/2022.    Review of Systems  Unable to perform ROS: Dementia    Immunization History  Administered Date(s) Administered   Influenza, High Dose Seasonal PF 05/04/2020, 04/10/2021   Influenza-Unspecified 04/17/2014, 05/01/2016, 04/15/2019   Moderna Covid-19 Vaccine Bivalent Booster 3yr & up 04/17/2021   Moderna SARS-COV2 Booster Vaccination 05/17/2020   Moderna Sars-Covid-2 Vaccination 07/12/2019, 08/09/2019   Pneumococcal Conjugate-13 04/20/2014   Pneumococcal Polysaccharide-23 07/08/2003, 03/31/2011   Pneumococcal-Unspecified 01/26/2004   Td 08/23/2007   Tdap 01/07/2012   Zoster Recombinat (Shingrix) 10/05/2017, 12/09/2017   Zoster, Live 07/17/2005   Pertinent  Health Maintenance Due  Topic Date Due   INFLUENZA VACCINE  02/04/2022      12/12/2021    8:00 PM 12/13/2021    8:00 AM  12/14/2021    8:00 AM 12/15/2021    9:00 AM 12/16/2021   10:00 AM  Fall Risk  Patient Fall Risk Level High fall risk High fall risk High fall risk High fall risk High fall risk   Functional Status Survey:    Vitals:   01/08/22 1449  BP: (!) 132/58  Pulse: 80  Resp: 18  Temp: (!) 97.5 F (36.4 C)  SpO2: 95%  Weight: 244 lb 12.8 oz (111 kg)  Height: '6\' 7"'$  (2.007 m)   Body mass index is 27.58 kg/m. Physical Exam Vitals reviewed.  Constitutional:      General: He is not in acute distress. HENT:     Head: Normocephalic.  Eyes:     General:        Right eye: No discharge.        Left eye: No discharge.  Cardiovascular:     Rate and Rhythm: Normal rate. Rhythm irregular.     Pulses: Normal pulses.     Heart sounds: Normal heart sounds.  Pulmonary:     Effort: Pulmonary effort is normal. No respiratory distress.     Breath sounds: Normal breath sounds. No wheezing.  Abdominal:     General: Bowel sounds are normal. There is no distension.     Palpations: Abdomen is soft.     Tenderness: There is no abdominal tenderness.  Genitourinary:    Comments: Suprapubic insertion site with purulent drainage on drainage bandage, increased redness around stoma, approx < 1 cm area of discoloration (black) at 3 o'clock, patient denies discomfort during exam Musculoskeletal:     Cervical back: Neck supple.     Right lower leg: No edema.     Left lower leg: No edema.  Skin:    General: Skin is warm and dry.     Capillary Refill: Capillary refill takes less than 2 seconds.  Neurological:     General: No focal deficit present.     Mental Status: He is alert. Mental status is at baseline.     Motor: Weakness present.     Gait: Gait abnormal.     Comments: walker  Psychiatric:        Mood and Affect: Mood normal.        Behavior: Behavior normal.        Cognition and Memory: Cognition is impaired. Memory is impaired.     Labs reviewed: Recent Labs  12/14/21 0131 12/15/21 0035  12/16/21 0146 12/20/21 0000 12/24/21 0000  NA 138 142 142 139 142  K 3.5 3.5 3.4* 4.1 4.5  CL 104 105 105 101 105  CO2 '26 26 27 '$ 26* 25*  GLUCOSE 132* 129* 122*  --   --   BUN 50* 57* 55* 31* 20  CREATININE 1.75* 1.72* 1.48* 1.2 1.2  CALCIUM 10.1 10.4* 10.7* 10.7 10.4   Recent Labs    09/26/21 0000 12/08/21 1904 12/11/21 2010  AST 11* 14* 16  ALT 6* 8 8  ALKPHOS 100 101 82  BILITOT  --  1.6* 1.9*  PROT  --  6.4* 6.2*  ALBUMIN 4.0 3.6 3.4*   Recent Labs    12/08/21 1904 12/09/21 0427 12/11/21 2010 12/12/21 0650 12/14/21 0131 12/15/21 0035 12/16/21 0146  WBC 4.7   < > 5.1   < > 4.1 4.0 3.4*  NEUTROABS 3.8  --  4.1  --   --   --   --   HGB 13.1   < > 13.2   < > 12.6* 12.7* 13.2  HCT 41.1   < > 43.1   < > 39.5 40.5 40.3  MCV 88.4   < > 90.5   < > 86.4 86.9 86.1  PLT 85*   < > 88*   < > 94* 95* 105*   < > = values in this interval not displayed.   Lab Results  Component Value Date   TSH 0.84 05/19/2019   Lab Results  Component Value Date   HGBA1C 6 08/22/2019   Lab Results  Component Value Date   CHOL 129 08/09/2020   HDL 32 (A) 08/09/2020   LDLCALC 80 08/09/2020   TRIG 86 08/09/2020    Significant Diagnostic Results in last 30 days:  DG Chest 2 View  Result Date: 12/12/2021 CLINICAL DATA:  Shortness of breath history of hypertension and AFib. EXAM: CHEST - 2 VIEW COMPARISON:  Radiograph December 11, 2021 FINDINGS: Similar enlarged cardiac silhouette. Unchanged cardiomediastinal contours. Aortic atherosclerosis. No overt pulmonary edema or focal airspace consolidation. Trace bilateral pleural effusions. Thoracic spondylosis. IMPRESSION: Enlarged cardiac silhouette and central vascular prominence without overt pulmonary edema. Trace bilateral pleural effusions. Electronically Signed   By: Dahlia Bailiff M.D.   On: 12/12/2021 11:47   DG Chest Portable 1 View  Result Date: 12/11/2021 CLINICAL DATA:  eval for sob EXAM: PORTABLE CHEST 1 VIEW COMPARISON:  Chest x-ray  12/08/2021 FINDINGS: Persistent cardiomegaly. The heart and mediastinal contours are unchanged. Aortic calcifications No focal consolidation. No pulmonary edema. No pleural effusion. No pneumothorax. No acute osseous abnormality. IMPRESSION: 1. Cardiomegaly with no active disease. Underlying pericardial effusion not excluded on AP portable technique. 2.  Aortic Atherosclerosis (ICD10-I70.0). Electronically Signed   By: Iven Finn M.D.   On: 12/11/2021 23:45    Assessment/Plan 1. Infection of external stoma of urinary tract (HCC) - purulent drainage, erythema and area of discoloration on stoma - start doxycycline 100 mg po BID x 14 days  - change drainage sponge BID - recommend f/u with urology- discussed with Jasmine  2. Suprapubic catheter ([place]) - followed by urology - remains on enhanced precautions due to colonization with MRSA - cont methenamine  3. Vascular dementia without behavioral disturbance ([place]) - no behavioral outbursts - cont Cymbalta for mood - cont skilled nursing care    Family/ staff Communication: plan discussed with patient and nurse  Labs/tests ordered:  none

## 2022-01-16 ENCOUNTER — Encounter: Payer: Self-pay | Admitting: Adult Health

## 2022-01-16 ENCOUNTER — Non-Acute Institutional Stay (SKILLED_NURSING_FACILITY): Payer: Medicare Other | Admitting: Adult Health

## 2022-01-16 DIAGNOSIS — F0150 Vascular dementia without behavioral disturbance: Secondary | ICD-10-CM | POA: Diagnosis not present

## 2022-01-16 DIAGNOSIS — N99521 Infection of other external stoma of urinary tract: Secondary | ICD-10-CM

## 2022-01-16 DIAGNOSIS — N2889 Other specified disorders of kidney and ureter: Secondary | ICD-10-CM | POA: Diagnosis not present

## 2022-01-16 DIAGNOSIS — I10 Essential (primary) hypertension: Secondary | ICD-10-CM | POA: Diagnosis not present

## 2022-01-16 DIAGNOSIS — R338 Other retention of urine: Secondary | ICD-10-CM

## 2022-01-16 DIAGNOSIS — I4811 Longstanding persistent atrial fibrillation: Secondary | ICD-10-CM | POA: Diagnosis not present

## 2022-01-16 DIAGNOSIS — N401 Enlarged prostate with lower urinary tract symptoms: Secondary | ICD-10-CM | POA: Diagnosis not present

## 2022-01-16 DIAGNOSIS — D696 Thrombocytopenia, unspecified: Secondary | ICD-10-CM

## 2022-01-16 DIAGNOSIS — I5032 Chronic diastolic (congestive) heart failure: Secondary | ICD-10-CM

## 2022-01-16 DIAGNOSIS — K5901 Slow transit constipation: Secondary | ICD-10-CM | POA: Diagnosis not present

## 2022-01-16 DIAGNOSIS — E782 Mixed hyperlipidemia: Secondary | ICD-10-CM

## 2022-01-16 NOTE — Progress Notes (Signed)
Location:   [place] Room Number: 142 Place of Service:  SNF [phone]) Provider:  [name], NP   [name], MD  [name], [name] [name], MD as PCP - Cardiology (Cardiology)  Extended Emergency Contact Information Primary Emergency Contact: Ybarra,[name] Address: [address]          [place], [place] [address] [name] of [place] Phone: [phone] Mobile Phone: [phone] Relation: Spouse Secondary Emergency Contact: [name],[name] Address: [address]          [place], [place] [address] [place] Mobile Phone: [phone] Relation: Daughter  Code Status:  DNR Palliative Care Goals of care: Advanced Directive information    01/16/2022   11:11 AM  Advanced Directives  Does Patient Have a Medical Advance Directive? Yes  Type of Advance Directive Out of facility DNR (pink MOST or yellow form);Living will  Does patient want to make changes to medical advance directive? No - Patient declined  Pre-existing out of facility DNR order (yellow form or pink MOST form) Pink MOST form placed in chart (order not valid for inpatient use);Yellow form placed in chart (order not valid for inpatient use)     Chief Complaint  Patient presents with   Medical Management of Chronic Issues   Quality Metric Gaps    Verified patient is due for TDAP    HPI:  Pt is a 88 y.o. male seen today for medical management of chronic diseases.  Followed by palliative care  He had two hospitalizations in the the month of June for CHF with acute exacerbation and NSTEMI  CHF (diastolic): denies sob, doe, pnd. Has edema in both legs. Weight has trended down see below. On lasix.  Wt Readings from Last 3 Encounters:  01/16/22 244 lb 12.8 oz (111 kg)  01/08/22 244 lb 12.8 oz (111 kg)  12/23/21 262 lb (118.8 kg)   Grade 3 diastolic dysfunction, low normal LVEF  50-55% by echo 12/09/21  Vascular dementia: no issues with behaviors. Still ambulatory, moving slower. More confused over time. No aggression or behaviors.   HX Renal mass-  CT 2018- right kidney mass 2.9 x 2.4 x 2.1 cm, renal U/S 09/27/2021- right kidney mass 4.8 x 4.4 x 3.9 cm  Hx of thrombocytopenia and leukopenia with questionable MDS per [name], no longer followed due to goals of care Plt 105,000  Has urinary retention with s/p cath due to BPH. Currently on doxycycline for infection of the stoma. Improved drainage. No fever. No bladder pain or other concerns. Hx of MRSA Past Medical History:  Diagnosis Date   Adult failure to thrive    Per incoming records from [facility]   Allergic rhinitis    Per incoming records from [facility]   Atrial fibrillation [facility])    BPH (benign prostatic hyperplasia)    Per incoming records from [place] [facility] [place] [facility])    Right, Per incoming records from [place]    Per incoming records from [facility]   Cerebrovascular accident, late effects    Per incoming records from [place] kidney disease, stage III (moderate) ([place])    Per incoming records from [facility])    Per incoming records from [facility]   Cognitive impairment    Mild, Per incoming  records from [facility]   Constipation    Per incoming records from [facility]   Dementia [facility])    Diarrhea    Better off Aricept, Per incoming records from [facility]   Diastolic dysfunction    on 2D Echo 07/2009 and 2016, Per incoming records from [facility]   Diverticulosis    Mild, Per incoming records from [facility]   Diverticulosis of colon (without mention of hemorrhage)    ED (erectile dysfunction)    Per incoming  records from [facility]   History of Coumadin therapy    Per incoming records from [facility]   History of echocardiogram 09/2014   Per incoming records from [facility]   Hyperlipemia    Hypertension    Kidney mass    Per incoming records from [place] leg edema    Per incoming records from [facility])    Neuropathy    Per incoming records from [place] (osteoarthritis)    Per incoming records from [facility]   Peripheral neuropathy    Per incoming records from [place] history of colonic [place] & 2004   adenomatous polyps   Pulmonary arterial hypertension ([place])    Per incoming records from [place]    Per incoming records from [facility]   Thrombocytopenia [facility]) 2017   Per incoming records from [facility]   UTI (urinary tract infection)    Sepsis, Per incoming records from [place] hypertrophy    Gilford Rile as ambulation aid    Per incoming records from [facility]   Past Surgical History:  Procedure Laterality Date   APPENDECTOMY     Per incoming records from [place]     Per incoming records from [facility]   IR [place]  11/21/2019   KNEE SURGERY     right   PILONIDAL CYST DRAINAGE     POLYPECTOMY     Per incoming records from [place] tumor lumbar spine     SPINE SURGERY     tumor removed   THORACIC 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     Allergies as of 01/16/2022       Reactions   Penicillins Rash   Has patient had a PCN reaction causing immediate rash, facial/tongue/throat swelling, SOB or lightheadedness with hypotension: unknown Has patient had a PCN reaction causing severe rash involving mucus membranes or skin necrosis: unknown Has patient had a PCN reaction that required hospitalization : unknown Has patient had a PCN reaction occurring within the last 10 years: no If all of the above answers are "  NO", then may proceed with Cephalosporin use.        Medication List        Accurate as of January 16, 2022 11:11 AM. If you have any questions, ask your nurse or doctor.          acetaminophen 500 MG tablet Commonly known as: TYLENOL Take 500 mg by mouth 2 (two) times daily.   cholecalciferol 25 MCG (1000 UNIT) tablet Commonly known as: VITAMIN D3 Take 2,000 Units by mouth daily.   doxycycline 100 MG capsule Commonly known as: VIBRAMYCIN Take 100 mg by mouth 2 (two) times daily.   DULoxetine 20 MG capsule Commonly known as: CYMBALTA Take 20 mg by mouth daily.   furosemide 20 MG tablet Commonly known as: LASIX Take 2 tablets (40 mg total) by mouth daily. Take extra '20mg'$  for weight gain of 2-3lbs in 1 day or 5 lbs in 1 week   methenamine 1 g tablet Commonly known as: MANDELAMINE Take 1,000 mg by mouth 2 (two) times daily.   polyethylene glycol 17 g packet Commonly known as: MIRALAX / GLYCOLAX Take 17 g by mouth daily.   potassium chloride 10 MEQ CR capsule Commonly known as: MICRO-K Take 10 mEq  by mouth every Tuesday, Thursday, and Saturday at 6 PM.   triamcinolone lotion 0.1 % Commonly known as: KENALOG Apply 1 application. topically 2 (two) times daily as needed (rash on back).   warfarin 4 MG tablet Commonly known as: COUMADIN Take 1 tablet (4 mg total) by mouth every Monday, Wednesday, and Friday. Hold Coumadin for 2days, check INR 6/14 and resume '4mg'$  daily alternating with '5mg'$  daily, if INR in therapeutic range   warfarin 5 MG tablet Commonly known as: COUMADIN Take 1 tablet (5 mg total) by mouth See admin instructions. Hold Coumadin for 2 days and then resume on 6/14, as per previous regimen 4 Mg daily alternating with 5 Mg daily        Review of Systems  Constitutional:  Positive for activity change (slower). Negative for appetite change, chills, diaphoresis, fatigue, fever and unexpected weight change.  Respiratory:  Negative for cough, shortness of breath, wheezing and stridor.   Cardiovascular:  Positive for leg swelling. Negative for chest pain and palpitations.  Gastrointestinal:  Negative for abdominal distention, abdominal pain, constipation and diarrhea.  Genitourinary:  Negative for difficulty urinating, dysuria and flank pain.  Musculoskeletal:  Positive for gait problem. Negative for arthralgias, back pain, joint swelling and myalgias.  Skin:  Positive for color change.  Neurological:  Negative for dizziness, seizures, syncope, facial asymmetry, speech difficulty, weakness and headaches.  Hematological:  Negative for adenopathy. Does not bruise/bleed easily.  Psychiatric/Behavioral:  Positive for confusion. Negative for agitation and behavioral problems.     Immunization History  Administered Date(s) Administered   Influenza, High Dose Seasonal PF 05/04/2020, 04/10/2021   Influenza-Unspecified 04/17/2014, 05/01/2016, 04/15/2019   Moderna Covid-19 Vaccine Bivalent Booster 27yr & up 04/17/2021   Moderna SARS-COV2 Booster Vaccination 05/17/2020   Moderna  Sars-Covid-2 Vaccination 07/12/2019, 08/09/2019   Pneumococcal Conjugate-13 04/20/2014   Pneumococcal Polysaccharide-23 07/08/2003, 03/31/2011   Pneumococcal-Unspecified 01/26/2004   Td 08/23/2007   Tdap 01/07/2012   Zoster Recombinat (Shingrix) 10/05/2017, 12/09/2017   Zoster, Live 07/17/2005   Pertinent  Health Maintenance Due  Topic Date Due   INFLUENZA VACCINE  02/04/2022      12/12/2021    8:00 PM 12/13/2021    8:00 AM 12/14/2021    8:00 AM 12/15/2021    9:00  AM 12/16/2021   10:00 AM  Fall Risk  Patient Fall Risk Level High fall risk High fall risk High fall risk High fall risk High fall risk   Functional Status Survey:    Vitals:   01/16/22 1057  BP: (!) 171/67  Pulse: (!) 58  Resp: 18  Temp: (!) 97.5 F (36.4 C)  SpO2: 95%  Weight: 244 lb 12.8 oz (111 kg)  Height: '6\' 7"'$  (2.007 m)   Body mass index is 27.58 kg/m. Physical Exam Constitutional:      General: He is not in acute distress.    Appearance: He is not diaphoretic.  HENT:     Head: Normocephalic and atraumatic.     Mouth/Throat:     Mouth: Mucous membranes are moist.     Pharynx: Oropharynx is clear.  Neck:     Thyroid: No thyromegaly.     Vascular: No JVD.     Trachea: No tracheal deviation.  Cardiovascular:     Rate and Rhythm: Normal rate. Rhythm irregular.     Heart sounds: No murmur heard. Pulmonary:     Effort: Pulmonary effort is normal. No respiratory distress.     Breath sounds: Normal breath sounds. No wheezing.  Abdominal:     General: Bowel sounds are normal. There is no distension.     Palpations: Abdomen is soft.     Tenderness: There is no abdominal tenderness.  Genitourinary:    Comments: Stoma with erythema surrounding. Hypergranulation tissue is pink with small amt of purulent matter noted.  Musculoskeletal:     Right lower leg: Edema present.     Left lower leg: Edema present.  Lymphadenopathy:     Cervical: No cervical adenopathy.  Skin:    General: Skin is warm and dry.   Neurological:     General: No focal deficit present.     Mental Status: He is alert. Mental status is at baseline.  Psychiatric:        Mood and Affect: Mood normal.     Labs reviewed: Recent Labs    12/14/21 0131 12/15/21 0035 12/16/21 0146 12/20/21 0000 12/24/21 0000  NA 138 142 142 139 142  K 3.5 3.5 3.4* 4.1 4.5  CL 104 105 105 101 105  CO2 '26 26 27 '$ 26* 25*  GLUCOSE 132* 129* 122*  --   --   BUN 50* 57* 55* 31* 20  CREATININE 1.75* 1.72* 1.48* 1.2 1.2  CALCIUM 10.1 10.4* 10.7* 10.7 10.4   Recent Labs    09/26/21 0000 12/08/21 1904 12/11/21 2010  AST 11* 14* 16  ALT 6* 8 8  ALKPHOS 100 101 82  BILITOT  --  1.6* 1.9*  PROT  --  6.4* 6.2*  ALBUMIN 4.0 3.6 3.4*   Recent Labs    12/08/21 1904 12/09/21 0427 12/11/21 2010 12/12/21 0650 12/14/21 0131 12/15/21 0035 12/16/21 0146  WBC 4.7   < > 5.1   < > 4.1 4.0 3.4*  NEUTROABS 3.8  --  4.1  --   --   --   --   HGB 13.1   < > 13.2   < > 12.6* 12.7* 13.2  HCT 41.1   < > 43.1   < > 39.5 40.5 40.3  MCV 88.4   < > 90.5   < > 86.4 86.9 86.1  PLT 85*   < > 88*   < > 94* 95* 105*   < > = values in this interval not displayed.  Lab Results  Component Value Date   TSH 0.84 05/19/2019   Lab Results  Component Value Date   HGBA1C 6 08/22/2019   Lab Results  Component Value Date   CHOL 129 08/09/2020   HDL 32 (A) 08/09/2020   LDLCALC 80 08/09/2020   TRIG 86 08/09/2020    Significant Diagnostic Results in last 30 days:  No results found.  Assessment/Plan  1. Chronic diastolic heart failure (HCC) Appears compensated Weight down Edema is unchanged Continue lasix per cardiology   2. Longstanding persistent atrial fibrillation (HCC) Rate is controlled On coumadin for CVA risk reduction.  INR due 7/17  3. Vascular dementia without behavioral disturbance (HCC) Progressive decline in cognition and physical function c/w the disease. Continue supportive care in the skilled environment.  4. Slow transit  constipation On miralax.   5. Essential hypertension BP 170s today but most numbers reviewed in matrix are well below his goal of 150/90 or less  6. Urinary retention due to benign prostatic hyperplasia S/P in place. No current issues.   7. Thrombocytopenia (HCC) Monitor CBC ?MDS No longer going out to see [name]   8. Renal mass, right No longer monitoring due to goals of care.   9. Mixed hyperlipidemia Off statin   10. 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

## 2022-01-20 DIAGNOSIS — I4891 Unspecified atrial fibrillation: Secondary | ICD-10-CM | POA: Diagnosis not present

## 2022-01-21 DIAGNOSIS — I5032 Chronic diastolic (congestive) heart failure: Secondary | ICD-10-CM | POA: Diagnosis not present

## 2022-01-21 LAB — BASIC METABOLIC PANEL
BUN: 20 (ref 4–21)
CO2: 18 (ref 13–22)
Chloride: 105 (ref 99–108)
Creatinine: 1.3 (ref 0.6–1.3)
Glucose: 110
Potassium: 4.8 mEq/L (ref 3.5–5.1)
Sodium: 138 (ref 137–147)

## 2022-01-21 LAB — COMPREHENSIVE METABOLIC PANEL: Calcium: 9.9 (ref 8.7–10.7)

## 2022-01-21 LAB — POCT INR: INR: 2.41 — AB (ref 0.80–1.20)

## 2022-01-21 LAB — PROTIME-INR: Protime: 23.3 — AB (ref 10.0–13.8)

## 2022-02-13 ENCOUNTER — Encounter: Payer: Self-pay | Admitting: Adult Health

## 2022-02-13 ENCOUNTER — Non-Acute Institutional Stay (SKILLED_NURSING_FACILITY): Payer: Medicare Other | Admitting: Adult Health

## 2022-02-13 DIAGNOSIS — Z7901 Long term (current) use of anticoagulants: Secondary | ICD-10-CM | POA: Diagnosis not present

## 2022-02-13 DIAGNOSIS — R001 Bradycardia, unspecified: Secondary | ICD-10-CM

## 2022-02-13 DIAGNOSIS — D696 Thrombocytopenia, unspecified: Secondary | ICD-10-CM | POA: Diagnosis not present

## 2022-02-13 DIAGNOSIS — I5032 Chronic diastolic (congestive) heart failure: Secondary | ICD-10-CM

## 2022-02-13 DIAGNOSIS — Z22322 Carrier or suspected carrier of Methicillin resistant Staphylococcus aureus: Secondary | ICD-10-CM | POA: Diagnosis not present

## 2022-02-13 DIAGNOSIS — N401 Enlarged prostate with lower urinary tract symptoms: Secondary | ICD-10-CM | POA: Diagnosis not present

## 2022-02-13 DIAGNOSIS — E782 Mixed hyperlipidemia: Secondary | ICD-10-CM | POA: Diagnosis not present

## 2022-02-13 DIAGNOSIS — K5901 Slow transit constipation: Secondary | ICD-10-CM | POA: Diagnosis not present

## 2022-02-13 DIAGNOSIS — I4811 Longstanding persistent atrial fibrillation: Secondary | ICD-10-CM

## 2022-02-13 DIAGNOSIS — Z9359 Other cystostomy status: Secondary | ICD-10-CM

## 2022-02-13 DIAGNOSIS — F0150 Vascular dementia without behavioral disturbance: Secondary | ICD-10-CM

## 2022-02-13 DIAGNOSIS — R338 Other retention of urine: Secondary | ICD-10-CM | POA: Diagnosis not present

## 2022-02-13 DIAGNOSIS — R230 Cyanosis: Secondary | ICD-10-CM | POA: Diagnosis not present

## 2022-02-13 DIAGNOSIS — R7989 Other specified abnormal findings of blood chemistry: Secondary | ICD-10-CM | POA: Diagnosis not present

## 2022-02-13 LAB — BASIC METABOLIC PANEL
BUN: 22 — AB (ref 4–21)
CO2: 25 — AB (ref 13–22)
Chloride: 101 (ref 99–108)
Creatinine: 1.4 — AB (ref 0.6–1.3)
Glucose: 119
Potassium: 4.8 mEq/L (ref 3.5–5.1)
Sodium: 144 (ref 137–147)

## 2022-02-13 LAB — CBC AND DIFFERENTIAL
HCT: 40 — AB (ref 41–53)
Hemoglobin: 13.2 — AB (ref 13.5–17.5)
Platelets: 96 10*3/uL — AB (ref 150–400)
WBC: 3.1

## 2022-02-13 LAB — CBC: RBC: 4.52 (ref 3.87–5.11)

## 2022-02-13 LAB — COMPREHENSIVE METABOLIC PANEL
Calcium: 10.5 (ref 8.7–10.7)
eGFR: 47

## 2022-02-13 NOTE — Progress Notes (Signed)
Location:  [place] Room Number: 142A Place of Service:  SNF [phone]) Provider:  [name], NP  [name], MD  [name], [name] [name], MD as PCP - Cardiology (Cardiology)  Extended Emergency Contact Information Primary Emergency Contact: Luger,[name] Address: [address]          [place], [place] [address] [name] Phone: [phone] Mobile Phone: [phone] Relation: Spouse Secondary Emergency Contact: [name],[name] Address: [address]          [place], [place] [address] [place] Mobile Phone: [phone] Relation: Daughter  Code Status:  DNR Goals of care: Advanced Directive information    01/16/2022   11:11 AM  Advanced Directives  Does Patient Have a Medical Advance Directive? Yes  Type of Advance Directive Out of facility DNR (pink MOST or yellow form);Living will  Does patient want to make changes to medical advance directive? No - Patient declined  Pre-existing out of facility DNR order (yellow form or pink MOST form) Pink MOST form placed in chart (order not valid for inpatient use);Yellow form placed in chart (order not valid for inpatient use)     Chief Complaint  Patient presents with   Medical Management of Chronic Issues    Medical Management of Chronic Issues.     HPI:  Pt is a 88 y.o. male seen today for medical management of chronic diseases.    Afib HR recorded 42 on 7/26, 56 7/19, 8/10 44 No issues with syncope or dizziness On coumadin last INR 2.4 01/20/22  On enhanced barrier precautions for hx of MRSA in urine Chronic foley in place due BPH with retention.   He had two hospitalizations in the the month of June for CHF with acute exacerbation and NSTEMI Followed by Palliative care.   CHF (diastolic): denies sob, doe, pnd. Has edema in both legs. Caregiver states he needs new ted  hose.   Grade 3 diastolic dysfunction, low normal LVEF 50-55% by echo 12/09/21 Wt Readings from Last 3 Encounters:  02/13/22 245 lb 14.4 oz (111.5 kg)  01/16/22 244 lb 12.8 oz (111 kg)  01/08/22 244 lb 12.8 oz (111 kg)   Has cyanosis to fingers intermittent with normal 02sats, worse when getting out of the shower.   BPs reviewed in matrix  Blood Pressure: 134 / 71 mmHg  Blood Pressure: 133 / 78 mmHg  Blood Pressure: 136 / 62 mmHg   Blood Pressure: 143 / 72 mmHg  Blood Pressure: 143 / 56 mmHg Also has hx of pancytopenia and renal mass. No longer followed by specialty due to goals of care. No current issues with hematuria.  Past Medical History:  Diagnosis Date   Adult failure to thrive    Per incoming records from [facility]   Allergic rhinitis    Per incoming records from [facility]   Atrial fibrillation [facility])    BPH (benign prostatic hyperplasia)    Per incoming records from [place] of kidney [facility])    Right, Per incoming records from [place]    Per incoming records from [facility]   Cerebrovascular accident, late effects    Per incoming records from [place] kidney disease, stage III (moderate) ([place])    Per incoming records from [facility])  Per incoming records from [facility]   Cognitive impairment    Mild, Per incoming records from [facility]   Constipation    Per incoming records from [facility]   Dementia [facility])    Diarrhea    Better off Aricept, Per incoming records from [facility]   Diastolic dysfunction    on 2D Echo 07/2009 and 2016, Per incoming records from [facility]   Diverticulosis    Mild, Per incoming records from [facility]   Diverticulosis of colon (without mention of  hemorrhage)    ED (erectile dysfunction)    Per incoming records from [facility]   History of Coumadin therapy    Per incoming records from [facility]   History of echocardiogram 09/2014   Per incoming records from [facility]   Hyperlipemia    Hypertension    Kidney mass    Per incoming records from [place] leg edema    Per incoming records from [facility])    Neuropathy    Per incoming records from [place] (osteoarthritis)    Per incoming records from [facility]   Peripheral neuropathy    Per incoming records from [place] history of colonic [place] & 2004   adenomatous polyps   Pulmonary arterial hypertension ([place])    Per incoming records from [place]    Per [facility] records [facility]   Thrombocytopenia [facility]) 2017   [facility] records [facility]   UTI (urinary tract infection)    Sepsis, Per incoming records from [place] hypertrophy    [place] Rile as ambulation aid    Per incoming records from [facility]   Past Surgical History:  Procedure Laterality Date   APPENDECTOMY     Per incoming records from [place]     Per incoming records from [facility]   IR [place]  11/21/2019   KNEE SURGERY     right   PILONIDAL CYST DRAINAGE     POLYPECTOMY     Per incoming records from [place] tumor lumbar spine     SPINE SURGERY     tumor removed   THORACIC 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, Per incoming records from [place]      Allergies  Allergen Reactions   Penicillins Rash    Has  patient had a PCN reaction causing immediate rash, facial/tongue/throat swelling, SOB or lightheadedness with hypotension: unknown Has patient had a PCN reaction causing severe rash involving mucus membranes or skin necrosis: unknown Has patient had a PCN reaction that required hospitalization : unknown Has patient had a PCN reaction occurring within the last 10 years: no If all of the above answers are "NO", then may proceed with Cephalosporin use.     Outpatient Encounter Medications as of 02/13/2022  Medication Sig   acetaminophen (TYLENOL) 500 MG tablet Take 500 mg by mouth 2 (two) times daily.   cholecalciferol (VITAMIN D3) 25 MCG (1000 UT) tablet Take 2,000 Units by mouth daily.    DULoxetine (CYMBALTA) 20 MG capsule Take 20 mg by mouth daily.   furosemide (LASIX) 20 MG tablet Take 2  tablets (40 mg total) by mouth daily. Take extra '20mg'$  for weight gain of 2-3lbs in 1 day or 5 lbs in 1 week   methenamine (MANDELAMINE) 1 g tablet Take 1,000 mg by mouth 2 (two) times daily.   mineral oil-hydrophilic petrolatum (AQUAPHOR) ointment Apply 1 Application topically 2 (two) times daily as needed for dry skin.   polyethylene glycol (MIRALAX / GLYCOLAX) 17 g packet Take 17 g by mouth daily.   potassium chloride (MICRO-K) 10 MEQ CR capsule Take 10 mEq by mouth every Tuesday, Thursday, and Saturday at 6 PM.   triamcinolone lotion (KENALOG) 0.1 % Apply 1 application. topically 2 (two) times daily as needed (rash on back).   warfarin (COUMADIN) 4 MG tablet Take 1 tablet (4 mg total) by mouth every Monday, Wednesday, and Friday. Hold Coumadin for 2days, check INR 6/14 and resume '4mg'$  daily alternating with '5mg'$  daily, if INR in therapeutic range (Patient taking differently: Take 4 mg by mouth. Every Mon.,Wed,Fri, and Sat. Hold Coumadin for 2days, check INR 6/14 and resume '4mg'$  daily alternating with '5mg'$  daily, if INR in therapeutic range)   warfarin (COUMADIN) 5 MG tablet Take 1 tablet (5 mg total) by mouth See  admin instructions. Hold Coumadin for 2 days and then resume on 6/14, as per previous regimen 4 Mg daily alternating with 5 Mg daily (Patient taking differently: Take 5 mg by mouth See admin instructions. Take One by mouth every Sun,Tues,Thurs.)   No facility-administered encounter medications on file as of 02/13/2022.    Review of Systems  Constitutional:  Negative for activity change, appetite change, chills, diaphoresis, fatigue, fever and unexpected weight change.  Respiratory:  Negative for cough, shortness of breath, wheezing and stridor.   Cardiovascular:  Positive for leg swelling. Negative for chest pain and palpitations.  Gastrointestinal:  Negative for abdominal distention, abdominal pain, constipation and diarrhea.  Genitourinary:  Negative for difficulty urinating and dysuria.  Musculoskeletal:  Positive for gait problem. Negative for arthralgias, back pain, joint swelling and myalgias.  Neurological:  Negative for dizziness, seizures, syncope, facial asymmetry, speech difficulty, weakness and headaches.  Hematological:  Negative for adenopathy. Does not bruise/bleed easily.  Psychiatric/Behavioral:  Positive for confusion. Negative for agitation and behavioral problems.     Immunization History  Administered Date(s) Administered   Influenza, High Dose Seasonal PF 05/04/2020, 04/10/2021   Influenza-Unspecified 04/17/2014, 05/01/2016, 04/15/2019   Moderna Covid-19 Vaccine Bivalent Booster 67yr & up 04/17/2021   Moderna SARS-COV2 Booster Vaccination 05/17/2020   Moderna Sars-Covid-2 Vaccination 07/12/2019, 08/09/2019   Pneumococcal Conjugate-13 04/20/2014   Pneumococcal Polysaccharide-23 07/08/2003, 03/31/2011   Pneumococcal-Unspecified 01/26/2004   Td 08/23/2007   Tdap 01/07/2012   Zoster Recombinat (Shingrix) 10/05/2017, 12/09/2017   Zoster, Live 07/17/2005   Pertinent  Health Maintenance Due  Topic Date Due   INFLUENZA VACCINE  02/04/2022      12/12/2021    8:00 PM  12/13/2021    8:00 AM 12/14/2021    8:00 AM 12/15/2021    9:00 AM 12/16/2021   10:00 AM  Fall Risk  Patient Fall Risk Level High fall risk High fall risk High fall risk High fall risk High fall risk   Functional Status Survey:    Vitals:   02/13/22 0923  BP: 134/71  Pulse: (!) 42  Temp: (!) 97.5 F (36.4 C)  TempSrc: Skin  SpO2: 98%  Weight: 245 lb 14.4 oz (111.5 kg)  Height: '6\' 7"'$  (2.007 m)   Body mass index is 27.7 kg/m. Physical Exam Constitutional:  General: He is not in acute distress.    Appearance: He is not diaphoretic.  HENT:     Head: Normocephalic and atraumatic.     Mouth/Throat:     Mouth: Mucous membranes are moist.     Pharynx: Oropharynx is clear.  Neck:     Thyroid: No thyromegaly.     Vascular: No JVD.     Trachea: No tracheal deviation.  Cardiovascular:     Rate and Rhythm: Bradycardia present. Rhythm irregular.     Heart sounds: No murmur heard. Pulmonary:     Effort: Pulmonary effort is normal. No respiratory distress.     Breath sounds: Normal breath sounds. No wheezing.     Comments: Bluish discoloration to finger tips  Abdominal:     General: Bowel sounds are normal. There is no distension.     Palpations: Abdomen is soft.     Tenderness: There is no abdominal tenderness.  Musculoskeletal:     Right lower leg: Edema (+1) present.     Left lower leg: Edema (+1) present.  Lymphadenopathy:     Cervical: No cervical adenopathy.  Skin:    General: Skin is warm and dry.     Coloration: Skin is pale.  Neurological:     General: No focal deficit present.     Mental Status: He is alert. Mental status is at baseline.     Cranial Nerves: No cranial nerve deficit.  Psychiatric:        Mood and Affect: Mood normal.     Labs reviewed: Recent Labs    12/14/21 0131 12/15/21 0035 12/16/21 0146 12/20/21 0000 12/24/21 0000  NA 138 142 142 139 142  K 3.5 3.5 3.4* 4.1 4.5  CL 104 105 105 101 105  CO2 '26 26 27 '$ 26* 25*  GLUCOSE 132* 129*  122*  --   --   BUN 50* 57* 55* 31* 20  CREATININE 1.75* 1.72* 1.48* 1.2 1.2  CALCIUM 10.1 10.4* 10.7* 10.7 10.4   Recent Labs    09/26/21 0000 12/08/21 1904 12/11/21 2010  AST 11* 14* 16  ALT 6* 8 8  ALKPHOS 100 101 82  BILITOT  --  1.6* 1.9*  PROT  --  6.4* 6.2*  ALBUMIN 4.0 3.6 3.4*   Recent Labs    12/08/21 1904 12/09/21 0427 12/11/21 2010 12/12/21 0650 12/14/21 0131 12/15/21 0035 12/16/21 0146  WBC 4.7   < > 5.1   < > 4.1 4.0 3.4*  NEUTROABS 3.8  --  4.1  --   --   --   --   HGB 13.1   < > 13.2   < > 12.6* 12.7* 13.2  HCT 41.1   < > 43.1   < > 39.5 40.5 40.3  MCV 88.4   < > 90.5   < > 86.4 86.9 86.1  PLT 85*   < > 88*   < > 94* 95* 105*   < > = values in this interval not displayed.   Lab Results  Component Value Date   TSH 0.84 05/19/2019   Lab Results  Component Value Date   HGBA1C 6 08/22/2019   Lab Results  Component Value Date   CHOL 129 08/09/2020   HDL 32 (A) 08/09/2020   LDLCALC 80 08/09/2020   TRIG 86 08/09/2020    Significant Diagnostic Results in last 30 days:  No results found.  Assessment/Plan  Bradycardia HR in the 40s. No symptoms EKG 12 lead showed afib, no  ST elevation, and frequent PVCs.  His wife has indicated due to his declining health she does not want him to go out for apts or to the hospital. He has a DNR and most form. She would like for him to have a virtual visit only with Dr.Smith regarding the bradycardia. Will order labs as well.   2. Chronic diastolic heart failure (HCC) Continue Lasix 40 mg qd Recommend compression hose Discussed with nurse about ordering a new pair  Continue to monitor weight  3. Slow transit constipation Continue miralax   4. Vascular dementia without behavioral disturbance (HCC) Progressive decline in cognition and physical function c/w the disease. Continue supportive care in the skilled environment.  5. Urinary retention due to benign prostatic hyperplasia On methenamine for UTI  prevention Long term s/p   6. Thrombocytopenia ([place]) Lab Results  Component Value Date   PLT 105 (L) 12/16/2021     7. Suprapubic catheter ([place]) [facility] No current issues.   8. MRSA (methicillin resistant staph aureus) culture positive Found on urine cx 12/04/21 On enhanced barrier prec No current symptoms  9. Mixed hyperlipidemia Off statin due to goals of care and lack of benefit.   10. Cyanosis of fingertip Chronic issue  Not currently having symptoms Has CAD CHF  11. Afib On coumadin for CVA risk reduction Not on BB or CCB due to low rate   Family/ staff Communication: Discussed with his wife Clem and she stated clearly that there wishes are for him never to go back to the ER or hospital again. She is ok with following virtually only with cardiology. Due to his dementia apts are harder to go out to and his goals of care are comfort based.   Labs/tests ordered:   INR CMP CBC stat

## 2022-02-17 ENCOUNTER — Encounter: Payer: Self-pay | Admitting: Adult Health

## 2022-02-17 DIAGNOSIS — Z7901 Long term (current) use of anticoagulants: Secondary | ICD-10-CM | POA: Diagnosis not present

## 2022-02-17 LAB — POCT INR: INR: 2.5 — AB (ref 0.80–1.20)

## 2022-02-17 LAB — PROTIME-INR: Protime: 24.9 — AB (ref 10.0–13.8)

## 2022-02-17 NOTE — Progress Notes (Unsigned)
Location:    [place] [facility] Room Number: 142 Place of Service:  SNF [phone]) Provider:  [name], NP  [name], MD  [name], [name] [name], MD as PCP - Cardiology (Cardiology)  Extended Emergency Contact Information Primary Emergency Contact: Strahle,[name] Address: [address]          [place], [place] [address] [place] Phone: [phone] Mobile Phone: [phone] Relation: Spouse Secondary Emergency Contact: [name],[name] Address: [address]          [place], [place] [address] [place] Mobile Phone: [phone] Relation: Daughter  Code Status:  DNR Palliative Care Goals of care: Advanced Directive information    02/17/2022   10:23 AM  Advanced Directives  Does Patient Have a Medical Advance Directive? Yes  Type of Advance Directive Out of facility DNR (pink MOST or yellow form);Living will  Does patient want to make changes to medical advance directive? No - Patient declined  Pre-existing out of facility DNR order (yellow form or pink MOST form) Pink MOST form placed in chart (order not valid for inpatient use);Yellow form placed in chart (order not valid for inpatient use)     Chief Complaint  Patient presents with  . Acute Visit    HPI:  Pt is a 88 y.o. male seen today for an acute visit for    Past Medical History:  Diagnosis Date  . Adult failure to thrive    Per incoming records [facility]  . Allergic rhinitis    Per incoming records from [facility]  . Atrial fibrillation ([place])   . BPH (benign prostatic hyperplasia)    Per incoming records from [facility]  . Cancer of kidney [facility])    Right, Per incoming records from [facility]  . Cataract    Per [facility] records [facility]  . Cerebrovascular accident,  late effects    Per incoming records from [facility]  . Chronic kidney disease, stage III (moderate) ([place])    Per incoming records from [facility]  . Claudication [facility])    Per incoming records from [facility]  . Cognitive impairment    Mild, Per incoming records from [facility]  . Constipation    Per incoming records from [facility]  . Dementia ([place])   . Diarrhea    Better off Aricept, Per incoming records from [facility]  . Diastolic dysfunction    on 2D Echo 07/2009 and 2016, Per incoming records from [facility]  . Diverticulosis    Mild, Per incoming records from [facility]  . Diverticulosis of colon (without mention of hemorrhage)   . ED (erectile dysfunction)    Per incoming records from [facility]  . History of Coumadin therapy    Per incoming records from [facility]  . History of echocardiogram 09/2014   Per incoming records from [facility]  . Hyperlipemia   . Hypertension   . Kidney mass    Per incoming records from [facility]  . Lower leg edema    Per incoming records from [facility]  . Melanoma ([place])   . Neuropathy    Per incoming records from [facility]  . OA (osteoarthritis)    Per incoming records from [facility]  . Peripheral  neuropathy    Per incoming records from [facility]  . Personal history of colonic polyps 1999 & 2004   adenomatous polyps  . Pulmonary arterial hypertension ([place])    Per incoming records from [facility]  . Rosacea    Per incoming records from [facility]  . Thrombocytopenia ([place]) 2017   Per incoming records from [facility]  . UTI (urinary tract infection)    Sepsis, Per incoming records from [facility]   . Ventricular hypertrophy   . Walker as ambulation aid    Per incoming records from [employer]   Past Surgical History:  Procedure Laterality Date  . APPENDECTOMY     Per incoming records from [facility]  . CATARACT EXTRACTION     Per incoming records from [facility]  . IR CATHETER TUBE CHANGE  11/21/2019  . KNEE SURGERY     right  . PILONIDAL CYST DRAINAGE    . POLYPECTOMY     Per incoming records from [facility]  . schwanoma tumor lumbar spine    . SPINE SURGERY     tumor removed  . THORACIC 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, Per incoming records from [facility]  . TONSILLECTOMY AND ADENOIDECTOMY      Allergies  Allergen Reactions  . Penicillins Rash    Has patient had a PCN reaction causing immediate rash, facial/tongue/throat swelling, SOB or lightheadedness with hypotension: unknown Has patient had a PCN reaction causing severe rash involving mucus membranes or skin necrosis: unknown Has patient had a PCN reaction that required hospitalization : unknown Has patient had a PCN reaction occurring within the last 10 years: no If all of the above answers are "NO", then may proceed with Cephalosporin use.     Allergies as of 02/17/2022       Reactions   Penicillins Rash   Has patient had a PCN reaction causing immediate rash, facial/tongue/throat swelling, SOB or lightheadedness with hypotension: unknown Has patient had a PCN reaction causing severe rash involving mucus membranes or skin necrosis: unknown Has patient had a PCN reaction that required hospitalization : unknown Has patient had a PCN reaction occurring within the last 10 years: no If all of the above answers are "NO", then may proceed with Cephalosporin use.        Medication List        Accurate as of February 17, 2022 10:24 AM. If you have any questions, ask your nurse or doctor.           STOP taking these medications    warfarin 4 MG tablet Commonly known as: COUMADIN Stopped by: [name], NP   warfarin 5 MG tablet Commonly known as: COUMADIN Stopped by: [name], NP       TAKE these medications    acetaminophen 500 MG tablet Commonly known as: TYLENOL Take 500 mg by mouth 2 (two) times daily.   cholecalciferol 25 MCG (1000 UNIT) tablet Commonly known as: VITAMIN D3 Take 2,000 Units by mouth daily.   DULoxetine 20 MG capsule Commonly known as: CYMBALTA Take 20 mg by mouth daily.   furosemide 20 MG tablet Commonly known as: LASIX Take 2 tablets (40 mg total) by mouth daily. Take extra '20mg'$  for weight gain of 2-3lbs in 1 day or 5 lbs in 1 week   methenamine 1 g tablet Commonly known as: MANDELAMINE Take 1,000 mg by mouth 2 (  two) times daily.   mineral oil-hydrophilic petrolatum ointment Apply 1 Application topically 2 (two) times daily as needed for dry skin.   polyethylene glycol 17 g packet Commonly known as: MIRALAX / GLYCOLAX Take 17 g by mouth daily.   potassium chloride 10 MEQ CR capsule Commonly known as: MICRO-K Take 10 mEq by mouth every Tuesday, Thursday, and Saturday at 6 PM.   triamcinolone lotion 0.1 % Commonly known as: KENALOG Apply 1 application. topically 2 (two) times daily as needed (rash on back).        Review of Systems  Immunization History  Administered Date(s) Administered  . Influenza, High Dose Seasonal PF 05/04/2020, 04/10/2021  . Influenza-Unspecified 04/17/2014, 05/01/2016, 04/15/2019  . Moderna Covid-19 Vaccine Bivalent Booster 30yr & up 04/17/2021  . Moderna SARS-COV2 Booster Vaccination 05/17/2020  . Moderna Sars-Covid-2 Vaccination 07/12/2019, 08/09/2019  . Pneumococcal Conjugate-13 04/20/2014  . Pneumococcal Polysaccharide-23 07/08/2003, 03/31/2011  . Pneumococcal-Unspecified 01/26/2004  . Td 08/23/2007  . Tdap 01/07/2012  . Zoster Recombinat (Shingrix) 10/05/2017, 12/09/2017  .  Zoster, Live 07/17/2005   Pertinent  Health Maintenance Due  Topic Date Due  . INFLUENZA VACCINE  02/04/2022      12/12/2021    8:00 PM 12/13/2021    8:00 AM 12/14/2021    8:00 AM 12/15/2021    9:00 AM 12/16/2021   10:00 AM  Fall Risk  Patient Fall Risk Level High fall risk High fall risk High fall risk High fall risk High fall risk   Functional Status Survey:    Vitals:   02/17/22 1016  BP: 138/82  Pulse: (!) 54  Resp: 12  Temp: (!) 97.5 F (36.4 C)  SpO2: 96%  Weight: 250 lb (113.4 kg)  Height: '6\' 7"'$  (2.007 m)   Body mass index is 28.16 kg/m. Physical Exam  Labs reviewed: Recent Labs    12/14/21 0131 12/15/21 0035 12/16/21 0146 12/20/21 0000 12/24/21 0000 01/21/22 0000  NA 138 142 142 139 142 138  K 3.5 3.5 3.4* 4.1 4.5 4.8  CL 104 105 105 101 105 105  CO2 '26 26 27 '$ 26* 25* 18  GLUCOSE 132* 129* 122*  --   --   --   BUN 50* 57* 55* 31* 20 20  CREATININE 1.75* 1.72* 1.48* 1.2 1.2 1.3  CALCIUM 10.1 10.4* 10.7* 10.7 10.4 9.9   Recent Labs    09/26/21 0000 12/08/21 1904 12/11/21 2010  AST 11* 14* 16  ALT 6* 8 8  ALKPHOS 100 101 82  BILITOT  --  1.6* 1.9*  PROT  --  6.4* 6.2*  ALBUMIN 4.0 3.6 3.4*   Recent Labs    12/08/21 1904 12/09/21 0427 12/11/21 2010 12/12/21 0650 12/14/21 0131 12/15/21 0035 12/16/21 0146 02/13/22 0000  WBC 4.7   < > 5.1   < > 4.1 4.0 3.4* 3.1  NEUTROABS 3.8  --  4.1  --   --   --   --   --   HGB 13.1   < > 13.2   < > 12.6* 12.7* 13.2 13.2*  HCT 41.1   < > 43.1   < > 39.5 40.5 40.3 40*  MCV 88.4   < > 90.5   < > 86.4 86.9 86.1  --   PLT 85*   < > 88*   < > 94* 95* 105* 96*   < > = values in this interval not displayed.   Lab Results  Component Value Date   TSH 0.84 05/19/2019  Lab Results  Component Value Date   HGBA1C 6 08/22/2019   Lab Results  Component Value Date   CHOL 129 08/09/2020   HDL 32 (A) 08/09/2020   LDLCALC 80 08/09/2020   TRIG 86 08/09/2020    Significant Diagnostic Results in last 30 days:   No results found.  Assessment/Plan There are no diagnoses linked to this encounter.   Family/ staff Communication:   Labs/tests ordered:    This encounter was created in error - please disregard.

## 2022-02-18 ENCOUNTER — Encounter: Payer: Self-pay | Admitting: Internal Medicine

## 2022-03-11 ENCOUNTER — Non-Acute Institutional Stay (SKILLED_NURSING_FACILITY): Payer: Medicare Other | Admitting: Internal Medicine

## 2022-03-11 ENCOUNTER — Encounter: Payer: Self-pay | Admitting: Internal Medicine

## 2022-03-11 DIAGNOSIS — Z9359 Other cystostomy status: Secondary | ICD-10-CM | POA: Diagnosis not present

## 2022-03-11 DIAGNOSIS — G609 Hereditary and idiopathic neuropathy, unspecified: Secondary | ICD-10-CM | POA: Diagnosis not present

## 2022-03-11 NOTE — Progress Notes (Unsigned)
Location:  [facility] Room Number: 142/A Place of Service:  SNF [phone]) Provider:    [name], MD  [name], [name] [name], MD as PCP - Cardiology (Cardiology)  Extended Emergency Contact Information Primary Emergency Contact: Dunkerson,[name] Address: [address]          [place], [place] [address] [name] Phone: [phone] Mobile Phone: [phone] Relation: Spouse Secondary Emergency Contact: [name],[name] Address: [address]          [place], [place] [address] [place] Mobile Phone: [phone] Relation: Daughter  Code Status:  DNR Goals of care: Advanced Directive information    03/11/2022    1:51 PM  Advanced Directives  Does Patient Have a Medical Advance Directive? Yes  Type of Advance Directive Out of facility DNR (pink MOST or yellow form)  Does patient want to make changes to medical advance directive? No - Patient declined  Pre-existing out of facility DNR order (yellow form or pink MOST form) Pink MOST/Yellow Form most recent copy in chart - Physician notified to receive inpatient order     Chief Complaint  Patient presents with   Acute Visit    Pain in legs.    HPI:  Pt is a 88 y.o. 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 MRI in 2020 Cortical Atrophy and Microvascular disease  Numbness and discomfort in Hands and Legs  Wife says he took Gabapentin before Patient has h/o Neuropathy with Work up in 2015 Was on Gabapentin 300 mg QHS was Taken off in 2020  Unable to get  much history from him He was more sleepy today as per Caregiver was up all night  Also Pulled his Suprapubic cathter  and had to be reinserted with some Trauma around the stroma It is draining well No Abdominal discomfort Wt Readings from Last 3 Encounters:  03/11/22 247 lb 14.4 oz (112.4 kg)  02/17/22 250 lb (113.4 kg)  02/13/22 245 lb 14.4 oz (111.5 kg)      Past Medical History:  Diagnosis Date   Adult failure to thrive    Per incoming records from [place]   Allergic rhinitis    Per incoming records from [facility]   Atrial fibrillation [facility])    BPH (benign prostatic hyperplasia)    Per incoming records from [place] [facility])    Right, Per incoming records from [place]    Per incoming records from [facility]   Cerebrovascular accident, late effects    Per incoming records from [facility]   Chronic kidney disease, stage III (moderate) ([place])    Per incoming records from [facility])    Per incoming records from [facility]   Cognitive impairment    Mild, Per incoming records from [facility]   Constipation    Per incoming records from [facility])  Diarrhea    Better off Aricept, Per incoming records from [facility]   Diastolic dysfunction    on 2D Echo 07/2009 and 2016, Per incoming records from [facility]   Diverticulosis    Mild, Per incoming records from [facility]   Diverticulosis of colon (without mention of hemorrhage)    ED (erectile dysfunction)    Per incoming records from [facility]   History of Coumadin therapy    Per incoming records from [facility]   History of echocardiogram 09/2014   Per incoming records from [facility]   Hyperlipemia    Hypertension    Kidney mass    Per incoming records from [place] leg edema    Per incoming records from [facility])    Neuropathy    Per incoming records from [place] (osteoarthritis)    Per incoming records from [facility]   Peripheral neuropathy    Per incoming records from [place] history of colonic [place] & 2004   adenomatous polyps   Pulmonary arterial hypertension ([place])    Per incoming records from [place]    Per incoming records from [facility]   Thrombocytopenia [facility]) 2017   Per incoming records from [facility]   UTI (urinary tract infection)    Sepsis, Per incoming records from [place] hypertrophy    Gilford Rile as ambulation aid    Per incoming records from [facility]   Past Surgical History:  Procedure Laterality Date   APPENDECTOMY     Per incoming records from [place]     Per incoming records from [facility]   IR [place]  11/21/2019   KNEE SURGERY     right   PILONIDAL CYST DRAINAGE     POLYPECTOMY     Per incoming records from [place] tumor lumbar spine     SPINE SURGERY     tumor removed   THORACIC 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, Per incoming records from [place]      Allergies  Allergen Reactions   Penicillins Rash    Has patient had a PCN reaction causing immediate rash, facial/tongue/throat swelling, SOB or lightheadedness with hypotension: unknown Has patient had a PCN reaction causing severe rash involving mucus membranes or skin necrosis: unknown Has patient had a PCN reaction that  required hospitalization : unknown Has patient had a PCN reaction occurring within the last 10 years: no If all of the above answers are "NO", then may proceed with Cephalosporin use.     Allergies as of 03/11/2022       Reactions   Penicillins Rash   Has patient had a PCN reaction causing immediate rash, facial/tongue/throat swelling, SOB or lightheadedness with hypotension: unknown Has patient had a PCN reaction causing severe rash involving mucus membranes or skin necrosis: unknown Has patient had a PCN reaction that required hospitalization : unknown Has patient had a PCN reaction occurring within the last 10 years: no If all of the above answers are "NO", then may proceed with Cephalosporin use.        Medication List        Accurate as  of March 11, 2022  2:11 PM. If you have any questions, ask your nurse or doctor.          acetaminophen 500 MG tablet Commonly known as: TYLENOL Take 500 mg by mouth 2 (two) times daily.   cholecalciferol 25 MCG (1000 UNIT) tablet Commonly known as: VITAMIN D3 Take 2,000 Units by mouth daily.   DULoxetine 20 MG capsule Commonly known as: CYMBALTA Take 20 mg by mouth daily.   furosemide 20 MG tablet Commonly known as: LASIX Take 2 tablets (40 mg total) by mouth daily. Take extra '20mg'$  for weight gain of 2-3lbs in 1 day or 5 lbs in 1 week   methenamine 1 g tablet Commonly known as: MANDELAMINE Take 1,000 mg by mouth 2 (two) times daily.   mineral oil-hydrophilic petrolatum ointment Apply 1 Application topically 2 (two) times daily as needed for dry skin.   polyethylene glycol 17 g packet Commonly known as: MIRALAX / GLYCOLAX Take 17 g by mouth daily.   potassium chloride 10 MEQ CR capsule Commonly known as: MICRO-K Take 10 mEq by mouth every Tuesday, Thursday, and Saturday at 6 PM.   triamcinolone lotion 0.1 % Commonly known as: KENALOG Apply 1 application. topically 2 (two) times daily as needed (rash on back).         Review of Systems  Unable to perform ROS: Dementia    Immunization History  Administered Date(s) Administered   DTaP 01/20/2022   Influenza, High Dose Seasonal PF 05/04/2020, 04/10/2021   Influenza-Unspecified 04/17/2014, 05/01/2016, 04/15/2019   Moderna Covid-19 Vaccine Bivalent Booster 42yr & up 04/17/2021   Moderna SARS-COV2 Booster Vaccination 05/17/2020   Moderna Sars-Covid-2 Vaccination 07/12/2019, 08/09/2019, 11/03/2021   Pneumococcal Conjugate-13 04/20/2014   Pneumococcal Polysaccharide-23 07/08/2003, 03/31/2011   Pneumococcal-Unspecified 01/26/2004   Td 08/23/2007   Tdap 01/07/2012   Zoster Recombinat (Shingrix) 10/05/2017, 12/09/2017   Zoster, Live 07/17/2005   Pertinent  Health Maintenance Due  Topic Date Due   INFLUENZA VACCINE  02/04/2022      12/12/2021    8:00 PM 12/13/2021    8:00 AM 12/14/2021    8:00 AM 12/15/2021    9:00 AM 12/16/2021   10:00 AM  Fall Risk  Patient Fall Risk Level High fall risk High fall risk High fall risk High fall risk High fall risk   Functional Status Survey:    Vitals:   03/11/22 1332  BP: 134/87  Pulse: 60  Resp: 17  Temp: (!) 97.2 F (36.2 C)  SpO2: 93%  Weight: 247 lb 14.4 oz (112.4 kg)  Height: '6\' 7"'$  (2.007 m)   Body mass index is 27.93 kg/m. Physical Exam Vitals reviewed.  Constitutional:      Appearance: Normal appearance.  HENT:     Head: Normocephalic.     Nose: Nose normal.     Mouth/Throat:     Mouth: Mucous membranes are moist.     Pharynx: Oropharynx is clear.  Eyes:     Pupils: Pupils are equal, round, and reactive to light.  Cardiovascular:     Rate and Rhythm: Normal rate and regular rhythm.     Pulses: Normal pulses.     Heart sounds: No murmur heard. Pulmonary:     Effort: Pulmonary effort is normal. No respiratory distress.     Breath sounds: Normal breath sounds. No rales.  Abdominal:     General: Abdomen is flat. Bowel sounds are normal.     Palpations: Abdomen is soft.     Comments:  Dried Blood around the Stoma Some odor but no discharge Redness or Tenderness Does have some hard area around the opening but no pain  Musculoskeletal:        General: Swelling present.     Cervical back: Neck supple.  Skin:    General: Skin is warm.  Neurological:     General: No focal deficit present.     Mental Status: He is alert and oriented to person, place, and time.  Psychiatric:        Mood and Affect: Mood normal.        Thought Content: Thought content normal.     Labs reviewed: Recent Labs    12/14/21 0131 12/15/21 0035 12/16/21 0146 12/20/21 0000 12/24/21 0000 01/21/22 0000 02/13/22 2014  NA 138 142 142   < > 142 138 144  K 3.5 3.5 3.4*   < > 4.5 4.8 4.8  CL 104 105 105   < > 105 105 101  CO2 '26 26 27   '$ < > 25* 18 25*  GLUCOSE 132* 129* 122*  --   --   --   --   BUN 50* 57* 55*   < > 20 20 22*  CREATININE 1.75* 1.72* 1.48*   < > 1.2 1.3 1.4*  CALCIUM 10.1 10.4* 10.7*   < > 10.4 9.9 10.5   < > = values in this interval not displayed.   Recent Labs    09/26/21 0000 12/08/21 1904 12/11/21 2010  AST 11* 14* 16  ALT 6* 8 8  ALKPHOS 100 101 82  BILITOT  --  1.6* 1.9*  PROT  --  6.4* 6.2*  ALBUMIN 4.0 3.6 3.4*   Recent Labs    12/08/21 1904 12/09/21 0427 12/11/21 2010 12/12/21 0650 12/14/21 0131 12/15/21 0035 12/16/21 0146 02/13/22 0000  WBC 4.7   < > 5.1   < > 4.1 4.0 3.4* 3.1  NEUTROABS 3.8  --  4.1  --   --   --   --   --   HGB 13.1   < > 13.2   < > 12.6* 12.7* 13.2 13.2*  HCT 41.1   < > 43.1   < > 39.5 40.5 40.3 40*  MCV 88.4   < > 90.5   < > 86.4 86.9 86.1  --   PLT 85*   < > 88*   < > 94* 95* 105* 96*   < > = values in this interval not displayed.   Lab Results  Component Value Date   TSH 0.84 05/19/2019   Lab Results  Component Value Date   HGBA1C 6 08/22/2019   Lab Results  Component Value Date   CHOL 129 08/09/2020   HDL 32 (A) 08/09/2020   LDLCALC 80 08/09/2020   TRIG 86 08/09/2020    Significant Diagnostic Results in  last 30 days:  No results found.  Assessment/Plan 1. Suprapubic catheter Stroma Does not seem Infected to me Will use Betadine Swab for 5 days Reassess if not better  2. Hereditary and idiopathic peripheral neuropathy/ Previous Alcohol Abuse Will start on Neurontin 100 mg QHS  Increase to 200 mg in 2 weeks    Family/ staff Communication:   Labs/tests ordered:

## 2022-03-13 DIAGNOSIS — Z7901 Long term (current) use of anticoagulants: Secondary | ICD-10-CM | POA: Diagnosis not present

## 2022-03-13 LAB — POCT INR: INR: 2.01 — AB (ref 0.80–1.20)

## 2022-03-13 LAB — PROTIME-INR: Protime: 20.3 — AB (ref 10.0–13.8)

## 2022-03-14 ENCOUNTER — Non-Acute Institutional Stay (SKILLED_NURSING_FACILITY): Payer: Medicare Other | Admitting: Adult Health

## 2022-03-14 ENCOUNTER — Encounter: Payer: Self-pay | Admitting: Adult Health

## 2022-03-14 ENCOUNTER — Encounter: Payer: Self-pay | Admitting: Internal Medicine

## 2022-03-14 DIAGNOSIS — Z9359 Other cystostomy status: Secondary | ICD-10-CM | POA: Diagnosis not present

## 2022-03-14 DIAGNOSIS — R410 Disorientation, unspecified: Secondary | ICD-10-CM

## 2022-03-14 DIAGNOSIS — R6883 Chills (without fever): Secondary | ICD-10-CM

## 2022-03-14 NOTE — Progress Notes (Signed)
Location:   [facility] Room Number: 142/A Place of Service:  SNF (31) Provider:  [name]   [name], [name] [name], MD as PCP - Cardiology (Cardiology)  Extended Emergency Contact Information Primary Emergency Contact: Kennington,[name] Address: [address]          [place], [place] [address] [name] of [place] Phone: [phone] Mobile Phone: [phone] Relation: Spouse Secondary Emergency Contact: [name],[name] Address: [address]          [place], [place] [address] [place] Mobile Phone: [phone] Relation: Daughter  Code Status:  DNR Goals of care: Advanced Directive information    03/14/2022    9:37 AM  Advanced Directives  Does Patient Have a Medical Advance Directive? Yes  Type of Advance Directive Out of facility DNR (pink MOST or yellow form)  Does patient want to make changes to medical advance directive? No - Patient declined  Pre-existing out of facility DNR order (yellow form or pink MOST form) Pink Most/Yellow Form available - Physician notified to receive inpatient order;Yellow form placed in chart (order not valid for inpatient use)     Chief Complaint  Patient presents with   Acute Visit    Confusion    HPI:  Pt is a 88 y.o. male seen today for an acute visit for increased confusion and shaking.  Resident has a hx of s/p cath due to urinary retention and recurrent UTI.  He is not having a fever but there are notes that he had rigors 2 days ago.  His wife visited and found him to be more confused than normal. The nurse states he is also more tired than normal.  Cath is draining yellow urine with no purulent matter. Vitals are stable. Appetite adequate. LBM 9/7.   The nurse reports she saw a blister at the stoma of the s/p cath which ruptured. Seen by Dr Lyndel Safe no sign of infection at that time on 9/5.  Betadine to site ordered.  He was started on neurontin as well on 9/5 due to hx of neuropathy and reports of numbness and tingling in hands and feet.   Past Medical History:  Diagnosis Date   Adult failure to thrive    Per incoming records from [facility]   Allergic rhinitis    Per incoming records from [facility]   Atrial fibrillation [facility])    BPH (benign prostatic hyperplasia)    Per incoming records from [place] of kidney [facility])    Right, Per incoming records from [place]    Per incoming records from [facility]   Cerebrovascular accident, late effects    Per incoming records from [place] kidney disease, stage III (moderate) ([place])    Per incoming records from [place] [facility])    Per incoming records from [facility]   Cognitive impairment    Mild, Per incoming records from [facility]   Constipation    Per incoming records from [facility]   Dementia [facility])    Diarrhea    Better off Aricept, Per incoming records from [facility]   Diastolic dysfunction    on 2D Echo 07/2009 and 2016, Per incoming records from [facility]   Diverticulosis    Mild, Per incoming records  from [facility]   Diverticulosis of colon (without mention of hemorrhage)    ED (erectile dysfunction)    Per incoming records from [facility]   History of Coumadin therapy    Per incoming records from [facility]   History of echocardiogram 09/2014   Per incoming records from [facility]   Hyperlipemia    Hypertension    Kidney mass    Per incoming records from [place] leg edema    Per incoming records from [facility])    Neuropathy    Per incoming records from  [facility]   OA (osteoarthritis)    Per incoming records from [facility]   Peripheral neuropathy    Per incoming records from [place] history of colonic [place] & 2004   adenomatous polyps   Pulmonary arterial hypertension ([place])    Per incoming records from [place]    Per incoming records from [facility]   Thrombocytopenia [facility]) 2017   Per incoming records from [facility]   UTI (urinary tract infection)    Sepsis, Per incoming records from [place] hypertrophy    Gilford Rile as ambulation aid    Per incoming records from [facility]   Past Surgical History:  Procedure Laterality Date   APPENDECTOMY     Per incoming records from [place]     Per incoming records from [facility]   IR [place]  11/21/2019   KNEE SURGERY     right   PILONIDAL CYST DRAINAGE     POLYPECTOMY     Per incoming records from [place] tumor lumbar spine     SPINE SURGERY     tumor removed   THORACIC 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, Per incoming records from [place]      Allergies  Allergen Reactions   Penicillins Rash    Has patient had a PCN reaction causing immediate rash, facial/tongue/throat swelling, SOB or lightheadedness with hypotension: unknown Has patient had a PCN reaction causing severe rash involving mucus membranes or skin necrosis: unknown Has patient had a PCN reaction that required hospitalization : unknown Has patient had a PCN reaction occurring within the last 10 years: no If all of the above answers are "NO", then may proceed with Cephalosporin use.     Allergies as of 03/14/2022       Reactions   Penicillins Rash   Has  patient had a PCN reaction causing immediate rash, facial/tongue/throat swelling, SOB or lightheadedness with hypotension: unknown Has patient had a PCN reaction causing severe rash involving mucus membranes or skin necrosis: unknown Has patient had a PCN reaction that required hospitalization : unknown Has patient had a PCN reaction occurring within the last 10 years: no If all of the above answers are "NO", then may proceed with Cephalosporin use.        Medication List        Accurate as of March 14, 2022 10:04 AM. If you have any questions, ask your nurse or doctor.          acetaminophen 500 MG tablet Commonly known as: TYLENOL Take 500 mg by mouth 2 (two) times daily.  cholecalciferol 25 MCG (1000 UNIT) tablet Commonly known as: VITAMIN D3 Take 2,000 Units by mouth daily.   DULoxetine 20 MG capsule Commonly known as: CYMBALTA Take 20 mg by mouth daily.   furosemide 20 MG tablet Commonly known as: LASIX Take 2 tablets (40 mg total) by mouth daily. Take extra '20mg'$  for weight gain of 2-3lbs in 1 day or 5 lbs in 1 week   methenamine 1 g tablet Commonly known as: MANDELAMINE Take 1,000 mg by mouth 2 (two) times daily.   mineral oil-hydrophilic petrolatum ointment Apply 1 Application topically 2 (two) times daily as needed for dry skin.   polyethylene glycol 17 g packet Commonly known as: MIRALAX / GLYCOLAX Take 17 g by mouth daily.   potassium chloride 10 MEQ CR capsule Commonly known as: MICRO-K Take 10 mEq by mouth every Tuesday, Thursday, and Saturday at 6 PM. Every Tues., Thurs., Sat. 7:00 am - 3:00 pm, per [facility].   triamcinolone lotion 0.1 % Commonly known as: KENALOG Apply 1 application. topically 2 (two) times daily as needed (rash on back).   warfarin 4 MG tablet Commonly known as: COUMADIN Take 4 mg by mouth daily. Once a day on Mon, Wed, Fri, Sat 4:00 pm - 7:00 pm   warfarin 5 MG tablet Commonly known as: COUMADIN Take 5 mg by  mouth daily. Give PO every Sun, Tues, Thurs 4:00 pm - 7:00 pm        Review of Systems  Constitutional:  Positive for chills and fatigue. Negative for activity change, appetite change, diaphoresis, fever and unexpected weight change.  Respiratory:  Negative for cough, shortness of breath, wheezing and stridor.   Cardiovascular:  Positive for leg swelling. Negative for chest pain and palpitations.  Gastrointestinal:  Negative for abdominal distention, abdominal pain, constipation and diarrhea.  Genitourinary:  Negative for decreased urine volume, difficulty urinating, dysuria, flank pain, frequency and urgency.  Musculoskeletal:  Positive for gait problem. Negative for arthralgias, back pain, joint swelling and myalgias.  Neurological:  Positive for tremors. Negative for dizziness, seizures, syncope, facial asymmetry, speech difficulty, weakness and headaches.  Hematological:  Negative for adenopathy. Does not bruise/bleed easily.  Psychiatric/Behavioral:  Positive for confusion. Negative for agitation and behavioral problems.     Immunization History  Administered Date(s) Administered   DTaP 01/20/2022   Influenza, High Dose Seasonal PF 05/04/2020, 04/10/2021   Influenza-Unspecified 04/17/2014, 05/01/2016, 04/15/2019   Moderna Covid-19 Vaccine Bivalent Booster 86yr & up 04/17/2021   Moderna SARS-COV2 Booster Vaccination 05/17/2020   Moderna Sars-Covid-2 Vaccination 07/12/2019, 08/09/2019, 11/03/2021   Pneumococcal Conjugate-13 04/20/2014   Pneumococcal Polysaccharide-23 07/08/2003, 03/31/2011   Pneumococcal-Unspecified 01/26/2004   Td 08/23/2007   Tdap 01/07/2012   Zoster Recombinat (Shingrix) 10/05/2017, 12/09/2017   Zoster, Live 07/17/2005   Pertinent  Health Maintenance Due  Topic Date Due   INFLUENZA VACCINE  02/04/2022      12/12/2021    8:00 PM 12/13/2021    8:00 AM 12/14/2021    8:00 AM 12/15/2021    9:00 AM 12/16/2021   10:00 AM  Fall Risk  Patient Fall Risk Level High  fall risk High fall risk High fall risk High fall risk High fall risk   Functional Status Survey:    Vitals:   03/14/22 0926  BP: (!) 122/55  Pulse: 62  Resp: 19  Temp: (!) 97.2 F (36.2 C)  SpO2: 95%  Weight: 247 lb 14.4 oz (112.4 kg)  Height: '6\' 7"'$  (2.007 m)   Body mass  index is 27.93 kg/m. Wt Readings from Last 3 Encounters:  03/14/22 247 lb 14.4 oz (112.4 kg)  03/11/22 247 lb 14.4 oz (112.4 kg)  02/17/22 250 lb (113.4 kg)    Physical Exam Vitals and nursing note reviewed.  HENT:     Mouth/Throat:     Mouth: Mucous membranes are moist.     Pharynx: Oropharynx is clear.  Cardiovascular:     Rate and Rhythm: Normal rate. Rhythm irregular.     Heart sounds: No murmur heard. Pulmonary:     Effort: Pulmonary effort is normal.     Comments: Decreased bases more so on the left when compared to the right.  Abdominal:     General: Bowel sounds are normal. There is no distension.     Palpations: Abdomen is soft.     Tenderness: There is no abdominal tenderness.  Genitourinary:    Comments: S/p site with blood on drain sponge. No purulent matter. Hypergranulation tissue noted.  Neurological:     General: No focal deficit present.     Mental Status: Mental status is at baseline.  Psychiatric:        Mood and Affect: Mood normal.     Labs reviewed: Recent Labs    12/14/21 0131 12/15/21 0035 12/16/21 0146 12/20/21 0000 12/24/21 0000 01/21/22 0000 02/13/22 2014  NA 138 142 142   < > 142 138 144  K 3.5 3.5 3.4*   < > 4.5 4.8 4.8  CL 104 105 105   < > 105 105 101  CO2 '26 26 27   '$ < > 25* 18 25*  GLUCOSE 132* 129* 122*  --   --   --   --   BUN 50* 57* 55*   < > 20 20 22*  CREATININE 1.75* 1.72* 1.48*   < > 1.2 1.3 1.4*  CALCIUM 10.1 10.4* 10.7*   < > 10.4 9.9 10.5   < > = values in this interval not displayed.   Recent Labs    09/26/21 0000 12/08/21 1904 12/11/21 2010  AST 11* 14* 16  ALT 6* 8 8  ALKPHOS 100 101 82  BILITOT  --  1.6* 1.9*  PROT  --  6.4*  6.2*  ALBUMIN 4.0 3.6 3.4*   Recent Labs    12/08/21 1904 12/09/21 0427 12/11/21 2010 12/12/21 0650 12/14/21 0131 12/15/21 0035 12/16/21 0146 02/13/22 0000  WBC 4.7   < > 5.1   < > 4.1 4.0 3.4* 3.1  NEUTROABS 3.8  --  4.1  --   --   --   --   --   HGB 13.1   < > 13.2   < > 12.6* 12.7* 13.2 13.2*  HCT 41.1   < > 43.1   < > 39.5 40.5 40.3 40*  MCV 88.4   < > 90.5   < > 86.4 86.9 86.1  --   PLT 85*   < > 88*   < > 94* 95* 105* 96*   < > = values in this interval not displayed.   Lab Results  Component Value Date   TSH 0.84 05/19/2019   Lab Results  Component Value Date   HGBA1C 6 08/22/2019   Lab Results  Component Value Date   CHOL 129 08/09/2020   HDL 32 (A) 08/09/2020   LDLCALC 80 08/09/2020   TRIG 86 08/09/2020    Significant Diagnostic Results in last 30 days:  No results found.  Assessment/Plan  1. Chills  No cough or other symptoms, did have chills without fever.  Has recurrent UTI and increased confusion and previous possible blister of s/p site Will check UA C and S   2. Confusion Has progressive worsening symptoms due to dementia but seems more fatigued and confused from baseline.   3. Suprapubic catheter ([place]) Recommend stat lock change and cath change prior to obtaining specimen Continue betadine.    Family/ staff Communication: nurse   Labs/tests ordered:   UA C and S

## 2022-03-15 DIAGNOSIS — Z7901 Long term (current) use of anticoagulants: Secondary | ICD-10-CM | POA: Diagnosis not present

## 2022-03-15 DIAGNOSIS — R4182 Altered mental status, unspecified: Secondary | ICD-10-CM | POA: Diagnosis not present

## 2022-03-15 DIAGNOSIS — N390 Urinary tract infection, site not specified: Secondary | ICD-10-CM | POA: Diagnosis not present

## 2022-03-15 DIAGNOSIS — N400 Enlarged prostate without lower urinary tract symptoms: Secondary | ICD-10-CM | POA: Diagnosis not present

## 2022-03-18 DIAGNOSIS — N390 Urinary tract infection, site not specified: Secondary | ICD-10-CM | POA: Diagnosis not present

## 2022-03-20 ENCOUNTER — Telehealth: Payer: Self-pay | Admitting: Adult Health

## 2022-03-20 DIAGNOSIS — N3000 Acute cystitis without hematuria: Secondary | ICD-10-CM

## 2022-03-20 MED ORDER — CEPHALEXIN 500 MG PO CAPS: 500.0000 mg | ORAL_CAPSULE | Freq: Three times a day (TID) | ORAL | 0 refills | Status: AC

## 2022-03-20 NOTE — Telephone Encounter (Signed)
Urine cx grew >100,000 colonies of EColi sensitive to cephalosporins. Will start Keflex. Has tolerated in the past.  MRSA did not grow in the specimen which it previously had. Will d/c precautions.

## 2022-03-27 DIAGNOSIS — Z7901 Long term (current) use of anticoagulants: Secondary | ICD-10-CM | POA: Diagnosis not present

## 2022-03-27 LAB — PROTIME-INR: Protime: 22 — AB (ref 10.0–13.8)

## 2022-03-27 LAB — POCT INR: INR: 2.19 — AB (ref 0.80–1.20)

## 2022-03-31 ENCOUNTER — Encounter: Payer: Self-pay | Admitting: Internal Medicine

## 2022-03-31 ENCOUNTER — Non-Acute Institutional Stay (SKILLED_NURSING_FACILITY): Payer: Medicare Other | Admitting: Internal Medicine

## 2022-03-31 DIAGNOSIS — D696 Thrombocytopenia, unspecified: Secondary | ICD-10-CM | POA: Diagnosis not present

## 2022-03-31 DIAGNOSIS — G609 Hereditary and idiopathic neuropathy, unspecified: Secondary | ICD-10-CM | POA: Diagnosis not present

## 2022-03-31 DIAGNOSIS — F0150 Vascular dementia without behavioral disturbance: Secondary | ICD-10-CM

## 2022-03-31 DIAGNOSIS — I5032 Chronic diastolic (congestive) heart failure: Secondary | ICD-10-CM | POA: Diagnosis not present

## 2022-03-31 DIAGNOSIS — N1831 Chronic kidney disease, stage 3a: Secondary | ICD-10-CM | POA: Diagnosis not present

## 2022-03-31 DIAGNOSIS — E782 Mixed hyperlipidemia: Secondary | ICD-10-CM

## 2022-03-31 DIAGNOSIS — Z9359 Other cystostomy status: Secondary | ICD-10-CM

## 2022-03-31 DIAGNOSIS — I4811 Longstanding persistent atrial fibrillation: Secondary | ICD-10-CM | POA: Diagnosis not present

## 2022-03-31 NOTE — Progress Notes (Unsigned)
Location:  [place] Room Number: SNF 142A Place of Service:  SNF [phone]) Provider:  Dr. Clydene Fake, MD  [name], [name] [name], MD as PCP - Cardiology (Cardiology)  Extended Emergency Contact Information Primary Emergency Contact: Sciuto,[name] Address: [address]          [place], [place] [address] [name] Phone: [phone] Mobile Phone: [phone] Relation: Spouse Secondary Emergency Contact: [name],[name] Address: [address]          [place], [place] [address] [place] Mobile Phone: [phone] Relation: Daughter  Code Status:  DNR Goals of care: Advanced Directive information    03/14/2022    9:37 AM  Advanced Directives  Does Patient Have a Medical Advance Directive? Yes  Type of Advance Directive Out of facility DNR (pink MOST or yellow form)  Does patient want to make changes to medical advance directive? No - Patient declined  Pre-existing out of facility DNR order (yellow form or pink MOST form) Pink Most/Yellow Form available - Physician notified to receive inpatient order;Yellow form placed in chart (order not valid for inpatient use)     Chief Complaint  Patient presents with  . Medical Management of Chronic Issues    Medical Management of Chronic Issues.     HPI:  Pt is a 88 y.o. male seen today for medical management of chronic diseases.    Lives in SNF   Has h/o A Fib on Coumadin , Chronic Diastolic CHF, HLD  Thrombocytopenia, Has seen Dr Alen Blew in 03/2018 Told to follow  Right Renal Mass seen in [place] in [place] H/o Urinary Retension s/p Suprapubic Cathter H/o Vascular Dementia MRI in 2020 Cortical Atrophy and Microvascular disease Neuropathy Previous work up has been negative  Recent UTI treated with Keflex   He is stable. No new Nursing issues. No Behavior issues Has lost some  weight in past few months Walks with her walker No Falls    Past Medical History:  Diagnosis Date  . Adult failure to thrive    Per incoming records from [facility]  . Allergic rhinitis    Per incoming records from [facility]  . Atrial fibrillation ([place])   . BPH (benign prostatic hyperplasia)    Per incoming records from [facility]  . Cancer of kidney [facility])    Right, Per incoming records from [facility]  . Cataract    Per incoming records from [facility]  . Cerebrovascular accident, late effects    Per incoming records from [facility]  . Chronic kidney disease, stage III (moderate) ([place])    Per incoming records from [facility]  . Claudication [facility])    Per incoming records from [facility]  . Cognitive impairment    Mild, Per incoming records from [facility]  . Constipation    Per incoming records from [facility]  . Dementia ([place])   . Diarrhea    Better off Aricept, Per incoming records from [facility]  . Diastolic dysfunction    on 2D Echo 07/2009 and 2016, Per incoming records from [facility]  . Diverticulosis    Mild, Per incoming records from [facility]  . Diverticulosis of colon (without mention of hemorrhage)   . ED (erectile dysfunction)    Per incoming records from [facility]  . History of Coumadin therapy    Per incoming records from [facility]  . History of echocardiogram 09/2014   Per incoming records from [facility]  . Hyperlipemia   . Hypertension   . Kidney mass    Per incoming records from [facility]  . Lower leg edema    Per incoming records from [facility]  . Melanoma ([place])   . Neuropathy    Per incoming records from [facility]  . OA  (osteoarthritis)    Per incoming records from [facility]  . Peripheral neuropathy    Per incoming records from [facility]  . Personal history of colonic polyps 1999 & 2004   adenomatous polyps  . Pulmonary arterial hypertension ([place])    Per incoming records from [facility]  . Rosacea    Per incoming records from [facility]  . Thrombocytopenia ([place]) 2017   Per incoming records from [facility]  . UTI (urinary tract infection)    Sepsis, Per incoming records from [facility]  . Ventricular hypertrophy   . Walker as ambulation aid    Per incoming records from [employer]   Past Surgical History:  Procedure Laterality Date  . APPENDECTOMY     Per incoming records from [facility]  . CATARACT EXTRACTION     Per incoming records from [facility]  . IR CATHETER TUBE CHANGE  11/21/2019  . KNEE SURGERY     right  . PILONIDAL CYST DRAINAGE    . POLYPECTOMY     Per incoming records from [facility]  . schwanoma tumor lumbar spine    . SPINE SURGERY     tumor removed  . THORACIC 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, Per incoming records from [facility]  . TONSILLECTOMY AND ADENOIDECTOMY      Allergies  Allergen Reactions  . Penicillins Rash    Has patient had a PCN reaction causing immediate rash, facial/tongue/throat swelling, SOB or lightheadedness with hypotension: unknown Has patient had a PCN reaction causing severe rash involving mucus membranes or skin necrosis: unknown Has patient had a PCN reaction that required hospitalization : unknown Has patient had a PCN reaction occurring within the last 10 years: no If all of the above answers are "NO", then may proceed with Cephalosporin use.     Outpatient Encounter Medications as of 03/31/2022  Medication Sig  . acetaminophen  (TYLENOL) 500 MG tablet Take 500 mg by mouth 2 (two) times daily.  . cholecalciferol (VITAMIN D3) 25 MCG (1000 UT) tablet Take 2,000 Units by mouth daily.   . DULoxetine (CYMBALTA) 20 MG capsule Take 20 mg by mouth daily.  . furosemide (LASIX) 20 MG tablet Take 2 tablets (40 mg total) by mouth daily. Take extra '20mg'$  for weight gain of 2-3lbs in 1 day or 5 lbs in 1 week  . gabapentin (NEURONTIN) 100 MG capsule Take 200 mg by mouth at bedtime.  . methenamine (MANDELAMINE) 1 g tablet Take 1,000 mg by mouth 2 (two) times daily.  . mineral oil-hydrophilic petrolatum (AQUAPHOR) ointment Apply 1 Application topically 2 (two) times daily as needed for dry skin.  Marland Kitchen nystatin (MYCOSTATIN/NYSTOP) powder Apply 1 Application topically 2 (two) times daily.  . polyethylene glycol (MIRALAX / GLYCOLAX) 17 g packet Take 17 g by mouth daily.  . potassium chloride (MICRO-K) 10 MEQ CR capsule Take 10  mEq by mouth every Tuesday, Thursday, and Saturday at 6 PM. Every Tues., Thurs., Sat. 7:00 am - 3:00 pm, per [facility].  Marland Kitchen triamcinolone lotion (KENALOG) 0.1 % Apply 1 application. topically 2 (two) times daily as needed (rash on back).  . warfarin (COUMADIN) 4 MG tablet Take 4 mg by mouth daily. Once a day on Mon, Wed, Fri, Sat 4:00 pm - 7:00 pm  . warfarin (COUMADIN) 5 MG tablet Take 5 mg by mouth daily. Give PO every Sun, Tues, Thurs 4:00 pm - 7:00 pm   No facility-administered encounter medications on file as of 03/31/2022.    Review of Systems  Unable to perform ROS: Dementia    Immunization History  Administered Date(s) Administered  . DTaP 01/20/2022  . Influenza, High Dose Seasonal PF 05/04/2020, 04/10/2021  . Influenza-Unspecified 04/17/2014, 05/01/2016, 04/15/2019  . Moderna Covid-19 Vaccine Bivalent Booster 19yr & up 04/17/2021  . Moderna SARS-COV2 Booster Vaccination 05/17/2020  . Moderna Sars-Covid-2 Vaccination 07/12/2019, 08/09/2019, 11/03/2021  . Pneumococcal Conjugate-13 04/20/2014  .  Pneumococcal Polysaccharide-23 07/08/2003, 03/31/2011  . Pneumococcal-Unspecified 01/26/2004  . Td 08/23/2007  . Tdap 01/07/2012  . Zoster Recombinat (Shingrix) 10/05/2017, 12/09/2017  . Zoster, Live 07/17/2005   Pertinent  Health Maintenance Due  Topic Date Due  . INFLUENZA VACCINE  02/04/2022      12/12/2021    8:00 PM 12/13/2021    8:00 AM 12/14/2021    8:00 AM 12/15/2021    9:00 AM 12/16/2021   10:00 AM  Fall Risk  Patient Fall Risk Level High fall risk High fall risk High fall risk High fall risk High fall risk   Functional Status Survey:    Vitals:   03/31/22 1057  BP: 135/64  Pulse: 63  Resp: 20  Temp: (!) 97.2 F (36.2 C)  SpO2: 93%  Weight: 253 lb 3.2 oz (114.9 kg)  Height: '6\' 7"'$  (2.007 m)   Body mass index is 28.52 kg/m. Physical Exam Vitals reviewed.  Constitutional:      Appearance: Normal appearance.  HENT:     Head: Normocephalic.     Nose: Nose normal.     Mouth/Throat:     Mouth: Mucous membranes are moist.     Pharynx: Oropharynx is clear.  Eyes:     Pupils: Pupils are equal, round, and reactive to light.  Cardiovascular:     Rate and Rhythm: Normal rate and regular rhythm.     Pulses: Normal pulses.     Heart sounds: No murmur heard. Pulmonary:     Effort: Pulmonary effort is normal. No respiratory distress.     Breath sounds: Normal breath sounds. No rales.  Abdominal:     General: Abdomen is flat. Bowel sounds are normal.     Palpations: Abdomen is soft.  Musculoskeletal:        General: Swelling present.     Cervical back: Neck supple.  Skin:    General: Skin is warm.  Neurological:     General: No focal deficit present.     Mental Status: He is alert.  Psychiatric:        Mood and Affect: Mood normal.        Thought Content: Thought content normal.    Labs reviewed: Recent Labs    12/14/21 0131 12/15/21 0035 12/16/21 0146 12/20/21 0000 12/24/21 0000 01/21/22 0000 02/13/22 2014  NA 138 142 142   < > 142 138 144  K 3.5  3.5 3.4*   < > 4.5 4.8  4.8  CL 104 105 105   < > 105 105 101  CO2 '26 26 27   '$ < > 25* 18 25*  GLUCOSE 132* 129* 122*  --   --   --   --   BUN 50* 57* 55*   < > 20 20 22*  CREATININE 1.75* 1.72* 1.48*   < > 1.2 1.3 1.4*  CALCIUM 10.1 10.4* 10.7*   < > 10.4 9.9 10.5   < > = values in this interval not displayed.   Recent Labs    09/26/21 0000 12/08/21 1904 12/11/21 2010  AST 11* 14* 16  ALT 6* 8 8  ALKPHOS 100 101 82  BILITOT  --  1.6* 1.9*  PROT  --  6.4* 6.2*  ALBUMIN 4.0 3.6 3.4*   Recent Labs    12/08/21 1904 12/09/21 0427 12/11/21 2010 12/12/21 0650 12/14/21 0131 12/15/21 0035 12/16/21 0146 02/13/22 0000  WBC 4.7   < > 5.1   < > 4.1 4.0 3.4* 3.1  NEUTROABS 3.8  --  4.1  --   --   --   --   --   HGB 13.1   < > 13.2   < > 12.6* 12.7* 13.2 13.2*  HCT 41.1   < > 43.1   < > 39.5 40.5 40.3 40*  MCV 88.4   < > 90.5   < > 86.4 86.9 86.1  --   PLT 85*   < > 88*   < > 94* 95* 105* 96*   < > = values in this interval not displayed.   Lab Results  Component Value Date   TSH 0.84 05/19/2019   Lab Results  Component Value Date   HGBA1C 6 08/22/2019   Lab Results  Component Value Date   CHOL 129 08/09/2020   HDL 32 (A) 08/09/2020   LDLCALC 80 08/09/2020   TRIG 86 08/09/2020    Significant Diagnostic Results in last 30 days:  No results found.  Assessment/Plan 1. Suprapubic catheter (HCC)With UTI Recently treated with Keflex On H  2. Chronic diastolic heart failure (HCC) On Lasix stable EF 55 % with LVH  3. Hereditary and idiopathic peripheral neuropathy Doing well on Gabapentin  4. Longstanding persistent atrial fibrillation (HCC) On Coumadin  5. Vascular dementia without behavioral disturbance ([place])   6. Thrombocytopenia ([place]) Has Seen Hematology before they recommended Surveillance and No further Work up [place] in 2018 did not show any splenomegaly  7. Mixed hyperlipidemia Off statin per Cardiology  8. Stage 3a chronic kidney disease (HCC) Creat  stable 9 CAD    Family/ staff Communication: ***  Labs/tests ordered:  ***

## 2022-04-11 DIAGNOSIS — Z23 Encounter for immunization: Secondary | ICD-10-CM | POA: Diagnosis not present

## 2022-04-14 ENCOUNTER — Encounter: Payer: Self-pay | Admitting: Adult Health

## 2022-04-14 ENCOUNTER — Non-Acute Institutional Stay (SKILLED_NURSING_FACILITY): Payer: Medicare Other | Admitting: Adult Health

## 2022-04-14 DIAGNOSIS — N1832 Chronic kidney disease, stage 3b: Secondary | ICD-10-CM | POA: Diagnosis not present

## 2022-04-14 DIAGNOSIS — R531 Weakness: Secondary | ICD-10-CM | POA: Diagnosis not present

## 2022-04-14 DIAGNOSIS — H6120 Impacted cerumen, unspecified ear: Secondary | ICD-10-CM

## 2022-04-14 NOTE — Progress Notes (Signed)
Location:  [facility] Room Number: 142A Place of Service:  SNF [phone]) Provider:  [name], NP  [name], MD  [name], [name] [name], MD as PCP - Cardiology (Cardiology)  Extended Emergency Contact Information Primary Emergency Contact: Bonawitz,[name] Address: [address]          [place], [place] [address] [name] of [place] Phone: [phone] Mobile Phone: [phone] Relation: Spouse Secondary Emergency Contact: [name],[name] Address: [address]          [place], [place] [address] [place] Mobile Phone: [phone] Relation: Daughter  Code Status:  DNR Goals of care: Advanced Directive information    03/14/2022    9:37 AM  Advanced Directives  Does Patient Have a Medical Advance Directive? Yes  Type of Advance Directive Out of facility DNR (pink MOST or yellow form)  Does patient want to make changes to medical advance directive? No - Patient declined  Pre-existing out of facility DNR order (yellow form or pink MOST form) Pink Most/Yellow Form available - Physician notified to receive inpatient order;Yellow form placed in chart (order not valid for inpatient use)     Chief Complaint  Patient presents with   Acute Visit    Ear Wax    HPI:  Pt is a 88 y.o. male seen today for an acute visit for ear wax.  Nurse completed SBAR for continued ear wax despite receiving debrox and irrigation with water and peroxide to both ears.   No issues hearing and not having any drainage.   Also nurse reports that he seemed weak on 10/5 and felt slightly dizzy on 10/5.  Fluids were encouraged symptoms improved. He is ambulatory with a walker but has been "slowing down".  Still having periods of increased confusion, asking where he is, where the car keys are located, and looking for his brother. No urinary symptoms. NO issues with s/p  site. Treated for UTI with Keflex in Sept. Under palliative care with no hospitalizations. Treatment in house permitted for infections.     Past Medical History:  Diagnosis Date   Adult failure to thrive    Per incoming records from [facility]   Allergic rhinitis    Per incoming records from [facility]   Atrial fibrillation [facility])    BPH (benign prostatic hyperplasia)    Per incoming records from [place] [facility])    Right, Per incoming records from [place]    Per incoming records from [facility]   Cerebrovascular accident, late effects    Per incoming records from [place] kidney disease, stage III (moderate) ([place])    Per incoming records from [facility])    Per incoming records from [facility]   Cognitive impairment    Mild, Per incoming records from [facility]   Constipation    Per incoming records from [facility]   Dementia [facility])    Diarrhea    Better off Aricept, Per incoming records from [facility]   Diastolic dysfunction    on 2D Echo 07/2009 and 2016, Per incoming records from [facility]   Diverticulosis    Mild, Per incoming records from [facility]   Diverticulosis of colon (without mention of hemorrhage)  ED (erectile dysfunction)    Per incoming records from [facility]   History of Coumadin therapy    Per incoming records from [facility]   History of echocardiogram 09/2014   Per incoming records from [facility]   Hyperlipemia    Hypertension    Kidney mass    Per incoming records from [facility] leg edema    Per incoming records from [facility])    Neuropathy    Per  incoming records from [facility]   OA (osteoarthritis)    Per incoming records from [facility]   Peripheral neuropathy    Per incoming records from [place] history of colonic [place] & 2004   adenomatous polyps   Pulmonary arterial hypertension ([place])    Per incoming records from [place]    Per incoming records from [facility]   Thrombocytopenia [facility]) 2017   Per incoming records from [facility]   UTI (urinary tract infection)    Sepsis, Per incoming records from [place] as ambulation aid    Per incoming records from [facility]   Past Surgical History:  Procedure Laterality Date   APPENDECTOMY     Per incoming records from [place]     Per incoming records from [place]  11/21/2019   KNEE SURGERY     right   PILONIDAL CYST DRAINAGE     POLYPECTOMY     Per incoming records from [facility] tumor lumbar spine     SPINE SURGERY     tumor removed   THORACIC 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, Per incoming records from [place]      Allergies  Allergen Reactions   Penicillins Rash    Has patient had a PCN reaction causing immediate rash, facial/tongue/throat swelling, SOB or lightheadedness with hypotension: unknown Has patient had a PCN reaction causing severe rash involving mucus membranes or skin necrosis: unknown Has patient had a PCN reaction that required hospitalization : unknown Has patient had a PCN reaction occurring within the last 10 years: no If all of the above answers are "NO", then may proceed with Cephalosporin use.     Outpatient Encounter Medications as of 04/14/2022   Medication Sig   acetaminophen (TYLENOL) 500 MG tablet Take 500 mg by mouth 2 (two) times daily.   cholecalciferol (VITAMIN D3) 25 MCG (1000 UT) tablet Take 2,000 Units by mouth daily.    DULoxetine (CYMBALTA) 20 MG capsule Take 20 mg by mouth daily.   furosemide (LASIX) 20 MG tablet Take 2 tablets (40 mg total) by mouth daily. Take extra '20mg'$  for weight gain of 2-3lbs in 1 day or 5 lbs in 1 week   gabapentin (NEURONTIN) 100 MG capsule Take 200 mg by mouth at bedtime.   methenamine (MANDELAMINE) 1 g tablet Take 1,000 mg by mouth 2 (two) times daily.   mineral oil-hydrophilic petrolatum (AQUAPHOR) ointment Apply 1 Application topically 2 (two) times daily as needed for dry skin.   polyethylene glycol (MIRALAX / GLYCOLAX) 17 g packet Take 17 g by mouth daily.   potassium chloride (MICRO-K) 10 MEQ CR capsule Take 10 mEq  by mouth every Tuesday, Thursday, and Saturday at 6 PM. Every Tues., Thurs., Sat. 7:00 am - 3:00 pm, per [facility].   triamcinolone lotion (KENALOG) 0.1 % Apply 1 application. topically 2 (two) times daily as needed (rash on back).   warfarin (COUMADIN) 4 MG tablet Take 4 mg by mouth daily. Once a day on Mon, Wed, Fri, Sat 4:00 pm - 7:00 pm   warfarin (COUMADIN) 5 MG tablet Take 5 mg by mouth daily. Give PO every Sun, Tues, Thurs 4:00 pm - 7:00 pm   [DISCONTINUED] nystatin (MYCOSTATIN/NYSTOP) powder Apply 1 Application topically 2 (two) times daily.   No facility-administered encounter medications on file as of 04/14/2022.    Review of Systems  Constitutional:  Positive for activity change. Negative for appetite change, chills, diaphoresis, fatigue, fever and unexpected weight change.  HENT:  Negative for congestion, ear discharge, ear pain and hearing loss.   Respiratory:  Negative for cough, shortness of breath, wheezing and stridor.   Cardiovascular:  Positive for leg swelling. Negative for chest pain and palpitations.  Gastrointestinal:  Negative for abdominal  distention, abdominal pain, constipation and diarrhea.  Genitourinary:  Negative for difficulty urinating and dysuria.  Musculoskeletal:  Positive for gait problem. Negative for arthralgias, back pain, joint swelling and myalgias.  Neurological:  Negative for dizziness, seizures, syncope, facial asymmetry, speech difficulty, weakness and headaches.  Hematological:  Negative for adenopathy. Does not bruise/bleed easily.  Psychiatric/Behavioral:  Positive for confusion. Negative for agitation and behavioral problems.     Immunization History  Administered Date(s) Administered   DTaP 01/20/2022   Influenza, High Dose Seasonal PF 05/04/2020, 04/10/2021   Influenza-Unspecified 04/17/2014, 05/01/2016, 04/15/2019   Moderna Covid-19 Vaccine Bivalent Booster 85yr & up 04/17/2021   Moderna SARS-COV2 Booster Vaccination 05/17/2020   Moderna Sars-Covid-2 Vaccination 07/12/2019, 08/09/2019, 11/03/2021   Pneumococcal Conjugate-13 04/20/2014   Pneumococcal Polysaccharide-23 07/08/2003, 03/31/2011   Pneumococcal-Unspecified 01/26/2004   Td 08/23/2007   Tdap 01/07/2012, 01/20/2022   Zoster Recombinat (Shingrix) 10/05/2017, 12/09/2017   Zoster, Live 07/17/2005   Pertinent  Health Maintenance Due  Topic Date Due   INFLUENZA VACCINE  02/04/2022      12/12/2021    8:00 PM 12/13/2021    8:00 AM 12/14/2021    8:00 AM 12/15/2021    9:00 AM 12/16/2021   10:00 AM  Fall Risk  Patient Fall Risk Level High fall risk High fall risk High fall risk High fall risk High fall risk   Functional Status Survey:    Vitals:   04/14/22 0941  BP: 134/73  Pulse: 68  Resp: (!) 24  Temp: (!) 97.3 F (36.3 C)  SpO2: 92%  Weight: 251 lb 3.2 oz (113.9 kg)  Height: '6\' 7"'$  (2.007 m)   Body mass index is 28.3 kg/m. Physical Exam Vitals and nursing note reviewed.  Constitutional:      General: He is not in acute distress.    Appearance: He is not diaphoretic.  HENT:     Head: Normocephalic and atraumatic.      Right Ear: Ear canal and external ear normal.     Left Ear: Ear canal and external ear normal.     Ears:     Comments: Soft light brown non impacted cerumen to both auditory canals. Able to see TMs.  Bilateral TM with reduced light reflex and white scar tissue present. No redness or purulent matter.  Neck:     Thyroid: No thyromegaly.     Vascular: No JVD.  Trachea: No tracheal deviation.  Cardiovascular:     Rate and Rhythm: Normal rate. Rhythm irregular.     Heart sounds: No murmur heard. Pulmonary:     Effort: Pulmonary effort is normal. No respiratory distress.     Breath sounds: Normal breath sounds. No wheezing.  Abdominal:     General: Bowel sounds are normal. There is no distension.     Palpations: Abdomen is soft.     Tenderness: There is no abdominal tenderness.  Musculoskeletal:     Comments: BLE edema +1  Lymphadenopathy:     Cervical: No cervical adenopathy.  Skin:    General: Skin is warm and dry.  Neurological:     General: No focal deficit present.     Mental Status: He is alert. Mental status is at baseline.     Comments: Alert and oriented to self and placed. Not able to accurately answer questions. Pleasant. Forgetful.      Labs reviewed: Recent Labs    12/14/21 0131 12/15/21 0035 12/16/21 0146 12/20/21 0000 12/24/21 0000 01/21/22 0000 02/13/22 2014  NA 138 142 142   < > 142 138 144  K 3.5 3.5 3.4*   < > 4.5 4.8 4.8  CL 104 105 105   < > 105 105 101  CO2 '26 26 27   '$ < > 25* 18 25*  GLUCOSE 132* 129* 122*  --   --   --   --   BUN 50* 57* 55*   < > 20 20 22*  CREATININE 1.75* 1.72* 1.48*   < > 1.2 1.3 1.4*  CALCIUM 10.1 10.4* 10.7*   < > 10.4 9.9 10.5   < > = values in this interval not displayed.   Recent Labs    09/26/21 0000 12/08/21 1904 12/11/21 2010  AST 11* 14* 16  ALT 6* 8 8  ALKPHOS 100 101 82  BILITOT  --  1.6* 1.9*  PROT  --  6.4* 6.2*  ALBUMIN 4.0 3.6 3.4*   Recent Labs    12/08/21 1904 12/09/21 0427 12/11/21 2010  12/12/21 0650 12/14/21 0131 12/15/21 0035 12/16/21 0146 02/13/22 0000  WBC 4.7   < > 5.1   < > 4.1 4.0 3.4* 3.1  NEUTROABS 3.8  --  4.1  --   --   --   --   --   HGB 13.1   < > 13.2   < > 12.6* 12.7* 13.2 13.2*  HCT 41.1   < > 43.1   < > 39.5 40.5 40.3 40*  MCV 88.4   < > 90.5   < > 86.4 86.9 86.1  --   PLT 85*   < > 88*   < > 94* 95* 105* 96*   < > = values in this interval not displayed.   Lab Results  Component Value Date   TSH 0.84 05/19/2019   Lab Results  Component Value Date   HGBA1C 6 08/22/2019   Lab Results  Component Value Date   CHOL 129 08/09/2020   HDL 32 (A) 08/09/2020   LDLCALC 80 08/09/2020   TRIG 86 08/09/2020    Significant Diagnostic Results in last 30 days:  No results found.  Assessment/Plan  1. Cerumen in auditory canal on examination Improved. Not impacted. No current symptoms.   2. Weakness Gait is slower over time, general issues not focal.   3. Stage 3b chronic kidney disease (HCC) BUn 22/Cr 1.4 two months ago. Will redrawn BMP with  next INR    Family/ staff Communication: nurse.   Labs/tests ordered:  BMP with next INR

## 2022-04-24 DIAGNOSIS — Z7901 Long term (current) use of anticoagulants: Secondary | ICD-10-CM | POA: Diagnosis not present

## 2022-04-24 DIAGNOSIS — N183 Chronic kidney disease, stage 3 unspecified: Secondary | ICD-10-CM | POA: Diagnosis not present

## 2022-04-24 LAB — BASIC METABOLIC PANEL
BUN: 25 — AB (ref 4–21)
CO2: 26 — AB (ref 13–22)
Chloride: 105 (ref 99–108)
Creatinine: 1.4 — AB (ref 0.6–1.3)
Glucose: 98
Potassium: 5 mEq/L (ref 3.5–5.1)
Sodium: 143 (ref 137–147)

## 2022-04-24 LAB — COMPREHENSIVE METABOLIC PANEL
Calcium: 10.6 (ref 8.7–10.7)
eGFR: 49

## 2022-04-24 LAB — PROTIME-INR: Protime: 22.7 — AB (ref 10.0–13.8)

## 2022-04-24 LAB — POCT INR: INR: 2.26 — AB (ref 0.80–1.20)

## 2022-04-29 ENCOUNTER — Encounter: Payer: Self-pay | Admitting: Internal Medicine

## 2022-05-02 ENCOUNTER — Encounter: Payer: Self-pay | Admitting: Adult Health

## 2022-05-02 ENCOUNTER — Non-Acute Institutional Stay (SKILLED_NURSING_FACILITY): Payer: Medicare Other | Admitting: Adult Health

## 2022-05-02 DIAGNOSIS — D708 Other neutropenia: Secondary | ICD-10-CM | POA: Diagnosis not present

## 2022-05-02 DIAGNOSIS — D696 Thrombocytopenia, unspecified: Secondary | ICD-10-CM

## 2022-05-02 DIAGNOSIS — I10 Essential (primary) hypertension: Secondary | ICD-10-CM | POA: Diagnosis not present

## 2022-05-02 DIAGNOSIS — G609 Hereditary and idiopathic neuropathy, unspecified: Secondary | ICD-10-CM | POA: Diagnosis not present

## 2022-05-02 DIAGNOSIS — R338 Other retention of urine: Secondary | ICD-10-CM

## 2022-05-02 DIAGNOSIS — N2889 Other specified disorders of kidney and ureter: Secondary | ICD-10-CM | POA: Diagnosis not present

## 2022-05-02 DIAGNOSIS — I5032 Chronic diastolic (congestive) heart failure: Secondary | ICD-10-CM

## 2022-05-02 DIAGNOSIS — I4811 Longstanding persistent atrial fibrillation: Secondary | ICD-10-CM

## 2022-05-02 DIAGNOSIS — F0150 Vascular dementia without behavioral disturbance: Secondary | ICD-10-CM

## 2022-05-02 DIAGNOSIS — N401 Enlarged prostate with lower urinary tract symptoms: Secondary | ICD-10-CM

## 2022-05-02 NOTE — Progress Notes (Signed)
Location:  [facility] Room Number: 142-A Place of Service:  SNF [phone]) Provider:  Filiberto Pinks, MD  [name], [name] [name], MD as PCP - Cardiology (Cardiology)  Extended Emergency Contact Information Primary Emergency Contact: Devries,[name] Address: [address]          [place], [place] [address] [name] of [place] Phone: [phone] Mobile Phone: [phone] Relation: Spouse Secondary Emergency Contact: [name],[name] Address: [address]          [place], [place] [address] [place] Mobile Phone: [phone] Relation: Daughter  Code Status:  DNR Goals of care: Advanced Directive information    05/02/2022    9:57 AM  Advanced Directives  Does Patient Have a Medical Advance Directive? Yes  Type of Advance Directive Living will;Out of facility DNR (pink MOST or yellow form)  Does patient want to make changes to medical advance directive? No - Patient declined  Pre-existing out of facility DNR order (yellow form or pink MOST form) Pink MOST/Yellow Form most recent copy in chart - Physician notified to receive inpatient order     Chief Complaint  Patient presents with   Routine    HPI:  Pt is a 88 y.o. male seen today for medical management of chronic diseases.    Vascular dementia: remains ambulatory, slowing down. Needs more cuing and reminders. No aggression or other behaviors. Goals of care are comfort based with no hospitalizations.   Afib HR recorded 42-68 ( no longer going out for apts due to goals of care per wife) No issues with syncope or dizziness On coumadin last INR 2.26 04/24/22  Chronic foley in place due BPH with retention. Recurrent UTI. He was treated for [facility] UTI with keflex. Wife expressed concern about UTI due to confusion in the afternoon. Nurse reports he is at baseline. No urinary  symptoms. Has sundowning.   CHF (diastolic): denies sob, doe, pnd. Has edema in both legs. Caregiver states he needs new ted hose.   Grade 3 diastolic dysfunction, low normal LVEF 50-55% by echo 12/09/21 Wt Readings from Last 3 Encounters:  05/02/22 250 lb 11.2 oz (113.7 kg)  04/14/22 251 lb 3.2 oz (113.9 kg)  03/31/22 253 lb 3.2 oz (114.9 kg)   BPs reviewed in matrix   Also has hx of pancytopenia and renal mass. No longer followed by specialty due to goals of care. No current issues with hematuria.   CKD BUN 25 Cr 1.4 04/24/22  Hx of HLD, CVA, NSTEMI Neuropathy Multiple skin cancers including melanoma  Past Medical History:  Diagnosis Date   Adult failure to thrive    Per incoming records from [facility]   Allergic rhinitis    Per incoming records from [facility]   Atrial fibrillation [facility])    BPH (benign prostatic hyperplasia)    Per incoming records from [place] of kidney [facility])    Right, Per incoming records from [place]    Per incoming records from [facility]   Cerebrovascular accident, late effects    Per incoming records from [place] kidney disease, stage III (moderate) ([place])    Per incoming records from [facility])    Per incoming records from [facility]   Cognitive impairment    Mild, Per  incoming records from [facility]   Constipation    Per incoming records from [facility]   Dementia [facility])    Diarrhea    Better off Aricept, Per incoming records from [facility]   Diastolic dysfunction    on 2D Echo 07/2009 and 2016, Per incoming records from [facility]   Diverticulosis    Mild, Per incoming records from [facility]   Diverticulosis of colon (without mention of hemorrhage)    ED (erectile  dysfunction)    Per incoming records from [facility]   History of Coumadin therapy    Per incoming records from [facility]   History of echocardiogram 09/2014   Per incoming records from [facility]   Hyperlipemia    Hypertension    Kidney mass    Per incoming records from [place] leg edema    Per incoming records from [facility])    Neuropathy    Per incoming records from [place] (osteoarthritis)    Per incoming records from [facility]   Peripheral neuropathy    Per incoming records from [place] history of colonic [place] & 2004   adenomatous polyps   Pulmonary arterial hypertension ([place])    Per incoming records from [place]    Per incoming records from [facility]   Thrombocytopenia [facility]) 2017   Per incoming records from [facility]   UTI (urinary tract infection)    Sepsis, Per incoming records from [place] hypertrophy    [place] Rile as ambulation aid    Per incoming records from [facility]   Past Surgical History:  Procedure Laterality Date   APPENDECTOMY     Per incoming records from [facility]     Per incoming records from [facility]   IR [place]  11/21/2019   KNEE SURGERY     right   PILONIDAL CYST DRAINAGE     POLYPECTOMY     Per incoming records from [place] tumor lumbar spine     SPINE SURGERY     tumor removed   THORACIC 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, Per incoming records from [place]      Allergies  Allergen Reactions   Penicillins Rash    Has patient had a PCN reaction causing  immediate rash, facial/tongue/throat swelling, SOB or lightheadedness with hypotension: unknown Has patient had a PCN reaction causing severe rash involving mucus membranes or skin necrosis: unknown Has patient had a PCN reaction that required hospitalization : unknown Has patient had a PCN reaction occurring within the last 10 years: no If all of the above answers are "NO", then may proceed with Cephalosporin use.     Outpatient Encounter Medications as of 05/02/2022  Medication Sig   acetaminophen (TYLENOL) 500 MG tablet Take 500 mg by mouth 2 (two) times daily.   cholecalciferol (VITAMIN D3) 25 MCG (1000 UT) tablet Take 2,000 Units by mouth daily.    DULoxetine (CYMBALTA) 20 MG capsule Take 20 mg by mouth daily.   furosemide (LASIX) 20 MG tablet Take 2 tablets (40 mg total) by mouth daily. Take extra '20mg'$  for weight gain of 2-3lbs in  1 day or 5 lbs in 1 week   gabapentin (NEURONTIN) 100 MG capsule Take 200 mg by mouth at bedtime.   ketoconazole (NIZORAL) 2 % shampoo Apply 1 Application topically 2 (two) times a week.   methenamine (MANDELAMINE) 1 g tablet Take 1,000 mg by mouth 2 (two) times daily.   mineral oil-hydrophilic petrolatum (AQUAPHOR) ointment Apply 1 Application topically 2 (two) times daily as needed for dry skin.   polyethylene glycol (MIRALAX / GLYCOLAX) 17 g packet Take 17 g by mouth daily.   potassium chloride (MICRO-K) 10 MEQ CR capsule Take 10 mEq by mouth every Tuesday, Thursday, and Saturday at 6 PM. Every Tues., Thurs., Sat. 7:00 am - 3:00 pm, per [facility].   triamcinolone lotion (KENALOG) 0.1 % Apply 1 application. topically 2 (two) times daily as needed (rash on back).   warfarin (COUMADIN) 4 MG tablet Take 4 mg by mouth daily. Once a day on Mon, Wed, Fri, Sat 4:00 pm - 7:00 pm   warfarin (COUMADIN) 5 MG tablet Take 5 mg by mouth daily. Give PO every Sun, Tues, Thurs 4:00 pm - 7:00 pm   No facility-administered encounter medications on file as of  05/02/2022.    Review of Systems  Constitutional:  Negative for activity change, appetite change, chills, diaphoresis, fatigue, fever and unexpected weight change.  Respiratory:  Negative for cough, shortness of breath, wheezing and stridor.   Cardiovascular:  Positive for leg swelling. Negative for chest pain and palpitations.  Gastrointestinal:  Negative for abdominal distention, abdominal pain, constipation and diarrhea.  Genitourinary:  Negative for difficulty urinating and dysuria.  Musculoskeletal:  Positive for gait problem. Negative for arthralgias, back pain, joint swelling and myalgias.  Neurological:  Negative for dizziness, seizures, syncope, facial asymmetry, speech difficulty, weakness and headaches.  Hematological:  Negative for adenopathy. Does not bruise/bleed easily.  Psychiatric/Behavioral:  Positive for confusion. Negative for agitation and behavioral problems.     Immunization History  Administered Date(s) Administered   DTaP 01/20/2022   Influenza, High Dose Seasonal PF 05/04/2020, 04/10/2021   Influenza-Unspecified 04/17/2014, 05/01/2016, 04/15/2019, 04/11/2022   Moderna Covid-19 Vaccine Bivalent Booster 79yr & up 04/17/2021   Moderna SARS-COV2 Booster Vaccination 05/17/2020   Moderna Sars-Covid-2 Vaccination 07/12/2019, 08/09/2019, 11/03/2021   Pneumococcal Conjugate-13 04/20/2014   Pneumococcal Polysaccharide-23 07/08/2003, 03/31/2011   Pneumococcal-Unspecified 01/26/2004   Td 08/23/2007   Tdap 01/07/2012, 01/20/2022   Zoster Recombinat (Shingrix) 10/05/2017, 12/09/2017   Zoster, Live 07/17/2005   Pertinent  Health Maintenance Due  Topic Date Due   INFLUENZA VACCINE  Completed      12/12/2021    8:00 PM 12/13/2021    8:00 AM 12/14/2021    8:00 AM 12/15/2021    9:00 AM 12/16/2021   10:00 AM  Fall Risk  Patient Fall Risk Level High fall risk High fall risk High fall risk High fall risk High fall risk   Functional Status Survey:    Vitals:   05/02/22  0950  BP: (!) 142/79  Pulse: (!) 55  Resp: 18  Temp: (!) 97.5 F (36.4 C)  SpO2: 96%  Weight: 250 lb 11.2 oz (113.7 kg)  Height: '6\' 7"'$  (2.007 m)   Body mass index is 28.24 kg/m. Physical Exam Vitals and nursing note reviewed.  Constitutional:      General: He is not in acute distress.    Appearance: He is not diaphoretic.  HENT:     Head: Normocephalic and atraumatic.  Neck:     Thyroid: No  thyromegaly.     Vascular: No JVD.     Trachea: No tracheal deviation.  Cardiovascular:     Rate and Rhythm: Bradycardia present. Rhythm irregular.     Heart sounds: No murmur heard. Pulmonary:     Effort: Pulmonary effort is normal. No respiratory distress.     Breath sounds: Normal breath sounds. No wheezing.  Abdominal:     General: Bowel sounds are normal. There is no distension.     Palpations: Abdomen is soft.     Tenderness: There is no abdominal tenderness.     Comments: S/p cath with clear yellow urine Stoma with hypergranulation tissue. No purulent drainage or redness.   Musculoskeletal:     Comments: BLE edema +1  Lymphadenopathy:     Cervical: No cervical adenopathy.  Skin:    General: Skin is warm and dry.  Neurological:     General: No focal deficit present.     Mental Status: He is alert. Mental status is at baseline.     Labs reviewed: Recent Labs    12/14/21 0131 12/15/21 0035 12/16/21 0146 12/20/21 0000 01/21/22 0000 02/13/22 2014 04/24/22 0000  NA 138 142 142   < > 138 144 143  K 3.5 3.5 3.4*   < > 4.8 4.8 5.0  CL 104 105 105   < > 105 101 105  CO2 '26 26 27   '$ < > 18 25* 26*  GLUCOSE 132* 129* 122*  --   --   --   --   BUN 50* 57* 55*   < > 20 22* 25*  CREATININE 1.75* 1.72* 1.48*   < > 1.3 1.4* 1.4*  CALCIUM 10.1 10.4* 10.7*   < > 9.9 10.5 10.6   < > = values in this interval not displayed.   Recent Labs    09/26/21 0000 12/08/21 1904 12/11/21 2010  AST 11* 14* 16  ALT 6* 8 8  ALKPHOS 100 101 82  BILITOT  --  1.6* 1.9*  PROT  --  6.4*  6.2*  ALBUMIN 4.0 3.6 3.4*   Recent Labs    12/08/21 1904 12/09/21 0427 12/11/21 2010 12/12/21 0650 12/14/21 0131 12/15/21 0035 12/16/21 0146 02/13/22 0000  WBC 4.7   < > 5.1   < > 4.1 4.0 3.4* 3.1  NEUTROABS 3.8  --  4.1  --   --   --   --   --   HGB 13.1   < > 13.2   < > 12.6* 12.7* 13.2 13.2*  HCT 41.1   < > 43.1   < > 39.5 40.5 40.3 40*  MCV 88.4   < > 90.5   < > 86.4 86.9 86.1  --   PLT 85*   < > 88*   < > 94* 95* 105* 96*   < > = values in this interval not displayed.   Lab Results  Component Value Date   TSH 0.84 05/19/2019   Lab Results  Component Value Date   HGBA1C 6 08/22/2019   Lab Results  Component Value Date   CHOL 129 08/09/2020   HDL 32 (A) 08/09/2020   LDLCALC 80 08/09/2020   TRIG 86 08/09/2020    Significant Diagnostic Results in last 30 days:  No results found.  Assessment/Plan  1. Chronic diastolic heart failure (HCC) On lasix Appears euvolemic  2. Essential hypertension Controlled  3. Longstanding persistent atrial fibrillation (HCC) Rate is slow ON coumadin for CVA risk reduction.  4. Vascular dementia without behavioral disturbance (HCC) Progressive decline in cognition and physical function c/w the disease. Continue supportive care in the skilled environment.  5. Hereditary and idiopathic peripheral neuropathy Continue neurontin, no acute issues.   6. Urinary retention due to benign prostatic hyperplasia S/p cath in place.   7. Thrombocytopenia ([place]) Lab Results  Component Value Date   PLT 96 (A) 02/13/2022  Has seen Shadad, no further work up due to goals of care   8. Renal mass, right No longer followed with CT imaging or going out to urology due to goals of care   9. Other neutropenia (HCC) Periodic CBC monitoring ?MDS myelosuppression Has seen Shadad, no further work up due to goals of care  Family/ staff Communication: nurse  Labs/tests ordered:  INR

## 2022-05-05 DIAGNOSIS — R4182 Altered mental status, unspecified: Secondary | ICD-10-CM | POA: Diagnosis not present

## 2022-05-05 LAB — CBC AND DIFFERENTIAL
HCT: 37 — AB (ref 41–53)
Hemoglobin: 11.9 — AB (ref 13.5–17.5)
Neutrophils Absolute: 1
Platelets: 77 10*3/uL — AB (ref 150–400)
WBC: 2.1

## 2022-05-05 LAB — BASIC METABOLIC PANEL
BUN: 27 — AB (ref 4–21)
CO2: 27 — AB (ref 13–22)
Chloride: 106 (ref 99–108)
Creatinine: 1.7 — AB (ref 0.6–1.3)
Potassium: 4.2 mEq/L (ref 3.5–5.1)
Sodium: 143 (ref 137–147)

## 2022-05-05 LAB — CBC: RBC: 4.2 (ref 3.87–5.11)

## 2022-05-05 LAB — COMPREHENSIVE METABOLIC PANEL: eGFR: 37

## 2022-05-09 DIAGNOSIS — Z7901 Long term (current) use of anticoagulants: Secondary | ICD-10-CM | POA: Diagnosis not present

## 2022-05-09 LAB — PROTIME-INR: Protime: 22.5 — AB (ref 10.0–13.8)

## 2022-05-09 LAB — POCT INR: INR: 2.24 — AB (ref 0.80–1.20)

## 2022-05-12 DIAGNOSIS — Z23 Encounter for immunization: Secondary | ICD-10-CM | POA: Diagnosis not present

## 2022-05-19 ENCOUNTER — Encounter: Payer: Self-pay | Admitting: Internal Medicine

## 2022-05-19 ENCOUNTER — Encounter: Payer: Self-pay | Admitting: Adult Health

## 2022-05-19 ENCOUNTER — Non-Acute Institutional Stay (SKILLED_NURSING_FACILITY): Payer: Medicare Other | Admitting: Adult Health

## 2022-05-19 DIAGNOSIS — M25552 Pain in left hip: Secondary | ICD-10-CM | POA: Diagnosis not present

## 2022-05-19 DIAGNOSIS — S5002XA Contusion of left elbow, initial encounter: Secondary | ICD-10-CM | POA: Diagnosis not present

## 2022-05-19 DIAGNOSIS — M25522 Pain in left elbow: Secondary | ICD-10-CM | POA: Diagnosis not present

## 2022-05-19 NOTE — Progress Notes (Signed)
Location:  Occupational psychologist of Service:  SNF (31) Provider:   Cindi Carbon, [place] [phone]   [name], MD  [name], [name] [name], MD as PCP - Cardiology (Cardiology)  Extended Emergency Contact Information Primary Emergency Contact: Lacewell,[name] Address: [address]          [place], [place] [address] [name] Phone: [phone] Mobile Phone: [phone] Relation: Spouse Secondary Emergency Contact: [name],[name] Address: [address]          [place], [place] [address] [place] Mobile Phone: [phone] Relation: Daughter  Code Status:  DNR Goals of care: Advanced Directive information    05/02/2022    9:57 AM  Advanced Directives  Does Patient Have a Medical Advance Directive? Yes  Type of Advance Directive Living will;Out of facility DNR (pink MOST or yellow form)  Does patient want to make changes to medical advance directive? No - Patient declined  Pre-existing out of facility DNR order (yellow form or pink MOST form) Pink MOST/Yellow Form most recent copy in chart - Physician notified to receive inpatient order     Chief Complaint  Patient presents with   Acute Visit    fall    HPI:  Pt is a 88 y.o. male seen today for an acute visit for a fall. He tripped and fell landing on his left side. He exhibited pain 9/10 to the left hip area. Also sustained hematoma to the left elbow with no pain. No LOC. No head injury. Is on coumadin. Able to bear weight but has pain. He was in his usual state of health prior to the fall. He does have underlying dementia progressing over the past year.   Past Medical History:  Diagnosis Date   Adult failure to thrive    Per incoming records from [facility]   Allergic rhinitis    Per incoming records from [facility]   Atrial  fibrillation [facility])    BPH (benign prostatic hyperplasia)    Per incoming records from [place] of kidney [facility])    Right, Per incoming records from [place]    Per incoming records from [facility]   Cerebrovascular accident, late effects    Per incoming records from [place] kidney disease, stage III (moderate) ([place])    Per incoming records from [facility])    Per incoming records from [facility]   Cognitive impairment    Mild, Per incoming records from [facility]   Constipation    Per incoming records from [facility]   Dementia [facility])    Diarrhea    Better off Aricept, Per incoming records from [facility]   Diastolic dysfunction    on 2D Echo 07/2009 and 2016, Per incoming records from [facility]   Diverticulosis    Mild, Per incoming records from [facility]   Diverticulosis of colon (without mention of hemorrhage)    ED (erectile dysfunction)    Per incoming records from [facility]   History of Coumadin therapy    Per incoming records from [facility]   History of echocardiogram 09/2014   Per incoming records from [facility]   Hyperlipemia    Hypertension  Kidney mass    Per incoming records from [place] leg edema    Per incoming records from [facility])    Neuropathy    Per incoming records from [facility]   OA (osteoarthritis)    Per incoming records from [facility]   Peripheral neuropathy    Per incoming records from [place] history of colonic [place] & 2004   adenomatous polyps   Pulmonary arterial hypertension ([place])    Per incoming records from  [place]    Per [facility]   Thrombocytopenia [facility]) 2017   Per incoming records from [facility]   UTI (urinary tract infection)    Sepsis, Per incoming records from [place] as ambulation aid    Per incoming records from [facility]   Past Surgical History:  Procedure Laterality Date   APPENDECTOMY     Per incoming records from [place]     Per incoming records from [place]  11/21/2019   KNEE SURGERY     right   PILONIDAL CYST DRAINAGE     POLYPECTOMY     Per incoming records from [place] tumor lumbar spine     SPINE SURGERY     tumor removed   THORACIC 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, Per incoming records from [place]      Allergies  Allergen Reactions   Penicillins Rash    Has patient had a PCN reaction causing immediate rash, facial/tongue/throat swelling, SOB or lightheadedness with hypotension: unknown Has patient had a PCN reaction causing severe rash involving mucus membranes or skin necrosis: unknown Has patient had a PCN reaction that required hospitalization : unknown Has patient had a PCN reaction occurring within the last 10 years: no If all of the above answers are "NO", then may proceed with Cephalosporin use.     Outpatient Encounter Medications as of 05/19/2022  Medication Sig   acetaminophen (TYLENOL) 500 MG tablet Take 500 mg by mouth 2 (two) times daily.   cholecalciferol (VITAMIN D3) 25 MCG (1000 UT) tablet Take 2,000 Units by mouth daily.    DULoxetine (CYMBALTA) 20 MG capsule Take 20 mg by mouth daily.   furosemide (LASIX) 20 MG tablet Take 2 tablets (40 mg total) by mouth daily. Take extra '20mg'$  for  weight gain of 2-3lbs in 1 day or 5 lbs in 1 week   gabapentin (NEURONTIN) 100 MG capsule Take 200 mg by mouth at bedtime.   ketoconazole (NIZORAL) 2 % shampoo Apply 1 Application topically 2 (two) times a week.   methenamine (MANDELAMINE) 1 g tablet Take 1,000 mg by mouth 2 (two) times daily.   mineral oil-hydrophilic petrolatum (AQUAPHOR) ointment Apply 1 Application topically 2 (two) times daily as needed for dry skin.   polyethylene glycol (MIRALAX / GLYCOLAX) 17 g packet Take 17 g by mouth daily.   potassium chloride (MICRO-K) 10 MEQ CR capsule Take 10 mEq by mouth every Tuesday, Thursday, and Saturday at 6 PM. Every Tues., Thurs., Sat. 7:00 am - 3:00 pm, per [facility].   triamcinolone lotion (KENALOG) 0.1 % Apply 1 application. topically 2 (two) times daily  as needed (rash on back).   warfarin (COUMADIN) 4 MG tablet Take 4 mg by mouth daily. Once a day on Mon, Wed, Fri, Sat 4:00 pm - 7:00 pm   warfarin (COUMADIN) 5 MG tablet Take 5 mg by mouth daily. Give PO every Sun, Tues, Thurs 4:00 pm - 7:00 pm   No facility-administered encounter medications on file as of 05/19/2022.    Review of Systems  Constitutional:  Positive for activity change. Negative for appetite change, chills, diaphoresis, fatigue, fever and unexpected weight change.  Respiratory:  Negative for cough, shortness of breath, wheezing and stridor.   Cardiovascular:  Positive for leg swelling. Negative for chest pain and palpitations.  Gastrointestinal:  Negative for abdominal distention, abdominal pain, constipation and diarrhea.  Genitourinary:  Negative for difficulty urinating and dysuria.  Musculoskeletal:  Positive for gait problem and joint swelling (left elbow with hematoma, not painful). Negative for back pain and myalgias.       Let hip  Neurological:  Negative for dizziness, seizures, syncope, facial asymmetry, speech difficulty, weakness and headaches.  Hematological:  Negative for adenopathy.  Bruises/bleeds easily.  Psychiatric/Behavioral:  Positive for confusion. Negative for agitation and behavioral problems.     Immunization History  Administered Date(s) Administered   DTaP 01/20/2022   Influenza, High Dose Seasonal PF 05/04/2020, 04/10/2021   Influenza-Unspecified 04/17/2014, 05/01/2016, 04/15/2019, 04/11/2022   Moderna Covid-19 Vaccine Bivalent Booster 57yr & up 04/17/2021   Moderna SARS-COV2 Booster Vaccination 05/17/2020   Moderna Sars-Covid-2 Vaccination 07/12/2019, 08/09/2019, 11/03/2021   Pneumococcal Conjugate-13 04/20/2014   Pneumococcal Polysaccharide-23 07/08/2003, 03/31/2011   Pneumococcal-Unspecified 01/26/2004   Td 08/23/2007   Tdap 01/07/2012, 01/20/2022   Zoster Recombinat (Shingrix) 10/05/2017, 12/09/2017   Zoster, Live 07/17/2005   Pertinent  Health Maintenance Due  Topic Date Due   INFLUENZA VACCINE  Completed      12/12/2021    8:00 PM 12/13/2021    8:00 AM 12/14/2021    8:00 AM 12/15/2021    9:00 AM 12/16/2021   10:00 AM  Fall Risk  Patient Fall Risk Level High fall risk High fall risk High fall risk High fall risk High fall risk   Functional Status Survey:    Vitals:   05/19/22 1212  BP: (!) 128/58  Pulse: (!) 50  Resp: (!) 24  Temp: (!) 97.2 F (36.2 C)  SpO2: 94%   There is no height or weight on file to calculate BMI. Physical Exam Vitals and nursing note reviewed.  Constitutional:      General: He is not in acute distress.    Appearance: He is not diaphoretic.  HENT:     Head: Normocephalic and atraumatic.     Mouth/Throat:     Mouth: Mucous membranes are dry.  Eyes:     Conjunctiva/sclera: Conjunctivae normal.     Pupils: Pupils are equal, round, and reactive to light.  Neck:     Thyroid: No thyromegaly.     Vascular: No JVD.     Trachea: No tracheal deviation.  Cardiovascular:     Rate and Rhythm: Bradycardia present. Rhythm irregular.     Heart sounds: No murmur heard. Pulmonary:     Effort: Pulmonary effort is  normal. No respiratory distress.     Breath sounds: Normal breath sounds. No wheezing.     Comments: Chronic cyanosis to fingers.  Abdominal:     General: Bowel sounds are normal. There is no distension.     Palpations: Abdomen is soft.  Tenderness: There is no abdominal tenderness.  Musculoskeletal:        General: Swelling (and bruising with hematoma to the left elbow area. No tenderness to touch or pain with ROM. +CMS to LUE) and tenderness (Left proximal femur area is tender and painful wtih ROM. No internal or external rotation. +CMS to LLE) present. No deformity.     Comments: BLE +2 chronic  Lymphadenopathy:     Cervical: No cervical adenopathy.  Skin:    General: Skin is warm and dry.  Neurological:     General: No focal deficit present.     Mental Status: He is alert. Mental status is at baseline.     Labs reviewed: Recent Labs    12/14/21 0131 12/15/21 0035 12/16/21 0146 12/20/21 0000 01/21/22 0000 02/13/22 2014 04/24/22 0000  NA 138 142 142   < > 138 144 143  K 3.5 3.5 3.4*   < > 4.8 4.8 5.0  CL 104 105 105   < > 105 101 105  CO2 '26 26 27   '$ < > 18 25* 26*  GLUCOSE 132* 129* 122*  --   --   --   --   BUN 50* 57* 55*   < > 20 22* 25*  CREATININE 1.75* 1.72* 1.48*   < > 1.3 1.4* 1.4*  CALCIUM 10.1 10.4* 10.7*   < > 9.9 10.5 10.6   < > = values in this interval not displayed.   Recent Labs    09/26/21 0000 12/08/21 1904 12/11/21 2010  AST 11* 14* 16  ALT 6* 8 8  ALKPHOS 100 101 82  BILITOT  --  1.6* 1.9*  PROT  --  6.4* 6.2*  ALBUMIN 4.0 3.6 3.4*   Recent Labs    12/08/21 1904 12/09/21 0427 12/11/21 2010 12/12/21 0650 12/14/21 0131 12/15/21 0035 12/16/21 0146 02/13/22 0000  WBC 4.7   < > 5.1   < > 4.1 4.0 3.4* 3.1  NEUTROABS 3.8  --  4.1  --   --   --   --   --   HGB 13.1   < > 13.2   < > 12.6* 12.7* 13.2 13.2*  HCT 41.1   < > 43.1   < > 39.5 40.5 40.3 40*  MCV 88.4   < > 90.5   < > 86.4 86.9 86.1  --   PLT 85*   < > 88*   < > 94* 95* 105*  96*   < > = values in this interval not displayed.   Lab Results  Component Value Date   TSH 0.84 05/19/2019   Lab Results  Component Value Date   HGBA1C 6 08/22/2019   Lab Results  Component Value Date   CHOL 129 08/09/2020   HDL 32 (A) 08/09/2020   LDLCALC 80 08/09/2020   TRIG 86 08/09/2020    Significant Diagnostic Results in last 30 days:  No results found.  Assessment/Plan 1. Left hip pain May given one dose of Norco 5/325 1 tablet for pain Would avoid bearing weight until xray returns.  Obtain Left hip xray 2 view stat Trying to avoid hospitalization due to goals of care.   2. Traumatic hematoma of left elbow, initial encounter Noted to left elbow with skin tear  Will obtain left elbow xray stat

## 2022-05-22 DIAGNOSIS — Z7901 Long term (current) use of anticoagulants: Secondary | ICD-10-CM | POA: Diagnosis not present

## 2022-05-22 LAB — PROTIME-INR: Protime: 29.2 — AB (ref 10.0–13.8)

## 2022-05-22 LAB — POCT INR: INR: 2.96 — AB (ref 0.80–1.20)

## 2022-05-23 ENCOUNTER — Encounter: Payer: Self-pay | Admitting: Adult Health

## 2022-05-23 ENCOUNTER — Non-Acute Institutional Stay (SKILLED_NURSING_FACILITY): Payer: Medicare Other | Admitting: Adult Health

## 2022-05-23 DIAGNOSIS — S7002XA Contusion of left hip, initial encounter: Secondary | ICD-10-CM | POA: Diagnosis not present

## 2022-05-23 DIAGNOSIS — L089 Local infection of the skin and subcutaneous tissue, unspecified: Secondary | ICD-10-CM

## 2022-05-23 DIAGNOSIS — B9689 Other specified bacterial agents as the cause of diseases classified elsewhere: Secondary | ICD-10-CM

## 2022-05-23 DIAGNOSIS — M25552 Pain in left hip: Secondary | ICD-10-CM | POA: Diagnosis not present

## 2022-05-23 DIAGNOSIS — S40022A Contusion of left upper arm, initial encounter: Secondary | ICD-10-CM | POA: Diagnosis not present

## 2022-05-23 DIAGNOSIS — Z7901 Long term (current) use of anticoagulants: Secondary | ICD-10-CM | POA: Diagnosis not present

## 2022-05-23 LAB — CBC AND DIFFERENTIAL
HCT: 33 — AB (ref 41–53)
Hemoglobin: 10.5 — AB (ref 13.5–17.5)
Neutrophils Absolute: 1.3
Platelets: 18 10*3/uL — AB (ref 150–400)
WBC: 2.3

## 2022-05-23 LAB — CBC: RBC: 3.79 — AB (ref 3.87–5.11)

## 2022-05-23 MED ORDER — MUPIROCIN 2 % EX OINT: 1.0000 | TOPICAL_OINTMENT | Freq: Two times a day (BID) | CUTANEOUS | 0 refills | Status: AC

## 2022-05-23 NOTE — Progress Notes (Signed)
abstraction

## 2022-05-23 NOTE — Progress Notes (Addendum)
Location:  [facility] Room Number: 142/A Place of Service:  SNF [phone]) Provider:  [name], NP  [name], MD  [name], [name] [name], MD as PCP - Cardiology (Cardiology)  Extended Emergency Contact Information Primary Emergency Contact: Burciaga,[name] Address: [address]          [place], [place] [address] [name] Phone: [phone] Mobile Phone: [phone] Relation: Spouse Secondary Emergency Contact: [name],[name] Address: [address]          [place], [place] [address] [place] Mobile Phone: [phone] Relation: Daughter  Code Status:  DNR Palliative Care Goals of care: Advanced Directive information    05/23/2022    8:47 AM  Advanced Directives  Does Patient Have a Medical Advance Directive? Yes  Type of Advance Directive Living will;Out of facility DNR (pink MOST or yellow form)     Chief Complaint  Patient presents with  . Acute Visit    Patient is experiencing left arm swelling.  . Quality Metric Gaps    Discussed the need for AWV last one 07/05/2021    HPI:  Pt is a 88 y.o. male seen today for an acute visit for left arm swelling.  He fell on his left side due to tripping on 11/13.  No LOC. NO head injury. Afterward a hematoma developed on the left arm. He had an elbow xray which showed no acute fracture. Now there is some increase in swelling to the area. He is not having pain.  His left hip is still hurting after falling on 11/13. He is limping. He has a large bruise to the area. He is still able to bear weight. ON coumadin INR 2.96 11/14.   Nurse reports some drainage and foul odor to to the s/p cath site. No fever or pain. Treated for UTI with Cipro for 7 days on 11/1.     Past Medical History:  Diagnosis Date  . Adult failure to thrive    Per incoming records from [facility]  . Allergic rhinitis    Per incoming records from [facility]  . Atrial fibrillation ([place])   . BPH (benign prostatic hyperplasia)    Per incoming records from [facility]  . Cancer of kidney [facility])    Right, Per incoming records from [facility]  . Cataract    Per incoming records from [facility]  . Cerebrovascular accident, late effects    Per incoming records from [facility]  . Chronic kidney disease, stage III (moderate) ([place])    Per incoming records from [facility]  . Claudication [facility])    Per incoming records from [facility]  . Cognitive impairment    Mild, Per incoming records from [facility]  . Constipation    Per incoming records from [facility]  . Dementia ([place])   . Diarrhea    Better off Aricept, Per incoming records from [facility]  . Diastolic dysfunction    on 2D Echo 07/2009 and 2016, Per incoming records from [facility]  . Diverticulosis    Mild, Per incoming records from [facility]  . Diverticulosis of colon (without mention of hemorrhage)   . ED (erectile dysfunction)    Per incoming records from [facility]  . History of Coumadin therapy    Per incoming  records from [facility]  . History of echocardiogram 09/2014   Per incoming records from [facility]  . Hyperlipemia   . Hypertension   . Kidney mass    Per incoming records from [facility]  . Lower leg edema    Per incoming records from [facility]  . Melanoma ([place])   . Neuropathy    Per incoming records from [facility]  . OA (osteoarthritis)    Per incoming records from [facility]  . Peripheral neuropathy    Per incoming records from [facility]  . Personal history  of colonic polyps 1999 & 2004   adenomatous polyps  . Pulmonary arterial hypertension ([place])    Per incoming records from [facility]  . Rosacea    Per incoming records from [facility]  . Thrombocytopenia ([place]) 2017   Per incoming records from [facility]  . UTI (urinary tract infection)    Sepsis, Per incoming records from [facility]  . Ventricular hypertrophy   . Walker as ambulation aid    Per incoming records from [employer]   Past Surgical History:  Procedure Laterality Date  . APPENDECTOMY     Per incoming records from [facility]  . CATARACT EXTRACTION     Per incoming records from [facility]  . IR CATHETER TUBE CHANGE  11/21/2019  . KNEE SURGERY     right  . PILONIDAL CYST DRAINAGE    . POLYPECTOMY     Per incoming records from [facility]  . schwanoma tumor lumbar spine    . SPINE SURGERY     tumor removed  . THORACIC 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, Per incoming records from [facility]  . TONSILLECTOMY AND ADENOIDECTOMY      Allergies  Allergen Reactions  . Penicillins Rash    Has patient had a PCN reaction causing immediate rash, facial/tongue/throat swelling, SOB or lightheadedness with hypotension: unknown Has patient had a PCN reaction causing severe rash involving mucus membranes or skin necrosis: unknown Has patient had a PCN reaction that required hospitalization : unknown Has patient had a PCN reaction occurring within the last 10 years: no If all of the above answers are "NO", then may proceed with Cephalosporin use.     Outpatient Encounter Medications as of 05/23/2022  Medication Sig  . acetaminophen (TYLENOL) 500 MG tablet Take 500 mg by mouth 2 (two) times daily.  . cholecalciferol (VITAMIN D3) 25 MCG (1000 UT) tablet Take 2,000 Units by mouth daily.   . Cranberry 400 MG  CAPS Take 400 mg by mouth every morning. 400 mg, oral, Once a morning  . DULoxetine (CYMBALTA) 20 MG capsule Take 20 mg by mouth daily.  . furosemide (LASIX) 20 MG tablet Take 2 tablets (40 mg total) by mouth daily. Take extra '20mg'$  for weight gain of 2-3lbs in 1 day or 5 lbs in 1 week  . gabapentin (NEURONTIN) 100 MG capsule Take 200 mg by mouth at bedtime.  Marland Kitchen ketoconazole (NIZORAL) 2 % shampoo Apply 1 Application topically 2 (two) times a week.  . methenamine (MANDELAMINE) 1 g tablet Take 1,000 mg by mouth 2 (two) times daily.  . polyethylene glycol (MIRALAX / GLYCOLAX) 17 g packet Take 17 g by mouth daily.  . potassium chloride (MICRO-K) 10 MEQ CR capsule Take 10 mEq by mouth every Tuesday, Thursday, and Saturday at 6  PM. Every Tues., Thurs., Sat. 7:00 am - 3:00 pm, per [facility].  Marland Kitchen triamcinolone lotion (KENALOG) 0.1 % Apply 1 application. topically 2 (two) times daily as needed (rash on back).  . warfarin (COUMADIN) 4 MG tablet Take 4 mg by mouth daily. Once a day on Mon, Wed, Fri, Sat 4:00 pm - 7:00 pm  . warfarin (COUMADIN) 5 MG tablet Take 5 mg by mouth daily. Give PO every Sun, Tues, Thurs 4:00 pm - 7:00 pm  . [DISCONTINUED] mineral oil-hydrophilic petrolatum (AQUAPHOR) ointment Apply 1 Application topically 2 (two) times daily as needed for dry skin.   No facility-administered encounter medications on file as of 05/23/2022.    Review of Systems  Constitutional:  Positive for activity change. Negative for appetite change, chills, diaphoresis, fatigue, fever and unexpected weight change.  Respiratory:  Negative for cough, shortness of breath, wheezing and stridor.   Cardiovascular:  Positive for leg swelling. Negative for chest pain and palpitations.  Gastrointestinal:  Negative for abdominal distention, abdominal pain, constipation and diarrhea.  Genitourinary:  Negative for difficulty urinating and dysuria.  Musculoskeletal:  Positive for gait problem and joint swelling.  Negative for arthralgias, back pain and myalgias.       Left hip pain. Left hip bruise. Left elbow with bruising and swelling.   Skin:        Drainage to s/p site.  Bruising.  Neurological:  Negative for dizziness, seizures, syncope, facial asymmetry, speech difficulty, weakness and headaches.  Hematological:  Negative for adenopathy. Does not bruise/bleed easily.  Psychiatric/Behavioral:  Positive for confusion. Negative for agitation and behavioral problems.     Immunization History  Administered Date(s) Administered  . DTaP 01/20/2022  . Influenza, High Dose Seasonal PF 05/04/2020, 04/10/2021  . Influenza-Unspecified 04/17/2014, 05/01/2016, 04/15/2019, 04/11/2022  . Moderna Covid-19 Vaccine Bivalent Booster 43yr & up 04/17/2021  . Moderna SARS-COV2 Booster Vaccination 05/17/2020, 05/12/2022  . Moderna Sars-Covid-2 Vaccination 07/12/2019, 08/09/2019, 11/03/2021  . Pneumococcal Conjugate-13 04/20/2014  . Pneumococcal Polysaccharide-23 07/08/2003, 03/31/2011  . Pneumococcal-Unspecified 01/26/2004  . Td 08/23/2007  . Tdap 01/07/2012, 01/20/2022  . Zoster Recombinat (Shingrix) 10/05/2017, 12/09/2017  . Zoster, Live 07/17/2005   Pertinent  Health Maintenance Due  Topic Date Due  . INFLUENZA VACCINE  Completed      12/12/2021    8:00 PM 12/13/2021    8:00 AM 12/14/2021    8:00 AM 12/15/2021    9:00 AM 12/16/2021   10:00 AM  Fall Risk  Patient Fall Risk Level High fall risk High fall risk High fall risk High fall risk High fall risk   Functional Status Survey:    Vitals:   05/23/22 0838  BP: 132/78  Pulse: 80  Resp: 18  Temp: 97.8 F (36.6 C)  TempSrc: Temporal  SpO2: 97%  Weight: 255 lb 3.2 oz (115.8 kg)  Height: '6\' 7"'$  (2.007 m)   Body mass index is 28.75 kg/m. Physical Exam Vitals and nursing note reviewed.  Constitutional:      General: He is not in acute distress.    Appearance: He is not diaphoretic.  HENT:     Head: Normocephalic and atraumatic.  Eyes:      Conjunctiva/sclera: Conjunctivae normal.     Pupils: Pupils are equal, round, and reactive to light.  Neck:     Thyroid: No thyromegaly.     Vascular: No JVD.     Trachea: No tracheal deviation.  Cardiovascular:     Rate and Rhythm: Bradycardia present. Rhythm irregular.  Heart sounds: No murmur heard.    Comments: HR 58-60 Pulmonary:     Effort: Pulmonary effort is normal. No respiratory distress.     Breath sounds: Normal breath sounds. No wheezing.  Abdominal:     General: Bowel sounds are normal. There is no distension.     Palpations: Abdomen is soft.     Tenderness: There is no abdominal tenderness.  Musculoskeletal:        General: Swelling (left elbow) present.     Right elbow: Normal.     Left elbow: Swelling present. No deformity, effusion or lacerations. Normal range of motion. No tenderness.     Right hip: Normal.     Left hip: Tenderness and bony tenderness present. No deformity or lacerations. Normal range of motion.     Right lower leg: 2+ Edema present.     Left lower leg: 2+ Edema present.     Comments: +CMS to LUE. Hematoma and swelling noted to left elbow area and forearm. Large bruise and swelling to left hip  Lymphadenopathy:     Cervical: No cervical adenopathy.  Skin:    General: Skin is warm and dry.  Neurological:     General: No focal deficit present.     Mental Status: He is alert. Mental status is at baseline.     Cranial Nerves: No cranial nerve deficit.    Labs reviewed: Recent Labs    12/14/21 0131 12/15/21 0035 12/16/21 0146 12/20/21 0000 01/21/22 0000 02/13/22 2014 04/24/22 0000 05/05/22 0000  NA 138 142 142   < > 138 144 143 143  K 3.5 3.5 3.4*   < > 4.8 4.8 5.0 4.2  CL 104 105 105   < > 105 101 105 106  CO2 '26 26 27   '$ < > 18 25* 26* 27*  GLUCOSE 132* 129* 122*  --   --   --   --   --   BUN 50* 57* 55*   < > 20 22* 25* 27*  CREATININE 1.75* 1.72* 1.48*   < > 1.3 1.4* 1.4* 1.7*  CALCIUM 10.1 10.4* 10.7*   < > 9.9 10.5 10.6  --     < > = values in this interval not displayed.   Recent Labs    09/26/21 0000 12/08/21 1904 12/11/21 2010  AST 11* 14* 16  ALT 6* 8 8  ALKPHOS 100 101 82  BILITOT  --  1.6* 1.9*  PROT  --  6.4* 6.2*  ALBUMIN 4.0 3.6 3.4*   Recent Labs    12/08/21 1904 12/09/21 0427 12/11/21 2010 12/12/21 0650 12/14/21 0131 12/15/21 0035 12/16/21 0146 02/13/22 0000 05/05/22 0000  WBC 4.7   < > 5.1   < > 4.1 4.0 3.4* 3.1 2.1  NEUTROABS 3.8  --  4.1  --   --   --   --   --  1.00  HGB 13.1   < > 13.2   < > 12.6* 12.7* 13.2 13.2* 11.9*  HCT 41.1   < > 43.1   < > 39.5 40.5 40.3 40* 37*  MCV 88.4   < > 90.5   < > 86.4 86.9 86.1  --   --   PLT 85*   < > 88*   < > 94* 95* 105* 96* 77*   < > = values in this interval not displayed.   Lab Results  Component Value Date   TSH 0.84 05/19/2019   Lab Results  Component  Value Date   HGBA1C 6 08/22/2019   Lab Results  Component Value Date   CHOL 129 08/09/2020   HDL 32 (A) 08/09/2020   LDLCALC 80 08/09/2020   TRIG 86 08/09/2020    Significant Diagnostic Results in last 30 days:  No results found.  Assessment/Plan  1. Hematoma of left hip, initial encounter Will re xray Increase tylenol to BID Avoiding hospitalizations.  Will check CBC stat  2. Traumatic hematoma of left upper arm, initial encounter No pain at this time Keep elevated Monitor for redness, pain , warmth, etc.  No sign of infection at this time.   3. Bacterial skin infection  - mupirocin ointment (BACTROBAN) 2 %; Apply 1 Application topically 2 (two) times daily for 7 days.  Dispense: 14 g; Refill: 0   Family/ staff Communication: discussed with Clem via camera  Lab: CBC stat

## 2022-05-26 ENCOUNTER — Telehealth: Payer: Self-pay | Admitting: Internal Medicine

## 2022-05-26 NOTE — Telephone Encounter (Signed)
Hold Coumadin for 2 days as patient has Bruise after he fell and his Hgb is down from 11.9 to 10.5 and has Big Bruise His Last INR was 2.9 Will Restart coumadin after 2 days and check INR in 4 weeks If he keeps falling have to reconsider coumadin

## 2022-05-27 ENCOUNTER — Encounter: Payer: Self-pay | Admitting: Orthopedic Surgery

## 2022-05-27 ENCOUNTER — Non-Acute Institutional Stay (SKILLED_NURSING_FACILITY): Payer: Medicare Other | Admitting: Orthopedic Surgery

## 2022-05-27 DIAGNOSIS — S40022D Contusion of left upper arm, subsequent encounter: Secondary | ICD-10-CM

## 2022-05-27 NOTE — Progress Notes (Unsigned)
Location:  [place] Room Number: 142/A Place of Service:  SNF [phone]) Provider:  Windell Moulding, NP   [name], [name] [name], MD as PCP - Cardiology (Cardiology)  Extended Emergency Contact Information Primary Emergency Contact: Hauk,[name] Address: [address]          [place], [place] [address] [name] of [place] Phone: [phone] Mobile Phone: [phone] Relation: Spouse Secondary Emergency Contact: [name],[name] Address: [address]          [place],  [address] [place] Mobile Phone: [phone] Relation: Daughter  Code Status:  DNR Goals of care: Advanced Directive information    05/23/2022    8:47 AM  Advanced Directives  Does Patient Have a Medical Advance Directive? Yes  Type of Advance Directive Living will;Out of facility DNR (pink MOST or yellow form)     Chief Complaint  Patient presents with   Acute Visit    Left Elbow Swelling   Quality Metric Gaps    To Discuss the following as listed to the Care Gaps as listed below.     HPI:  Pt is a 88 y.o. male seen today for an acute visit for left arm swelling.   11/13 he fell on left side and developed a hematoma to left arm. Xrays negative for acute fracture. He is on coumadin. Last INR 2.96 05/20/2022. Recently completed cipro x 7 days due to UTI. He was also given mupirocin x 7 days due to bacterial skin infection. Today, nursing concerned left arm remains swollen. He is a poor historian due to vascular dementia. He denies left arm pain. He is able to move left arm without difficulty. Afebrile. Vitals stable.    Past Medical History:  Diagnosis Date   Adult failure to thrive    Per incoming records from [facility]   Allergic rhinitis    Per incoming records from [facility]   Atrial fibrillation [facility])    BPH (benign prostatic  hyperplasia)    Per incoming records from [place] of kidney [facility])    Right, Per incoming records from [place]    [facility]   Cerebrovascular accident, late effects    Per incoming records from [place] kidney disease, stage III (moderate) ([place])    Per incoming records from [facility])    Per incoming records from [facility]   Cognitive impairment    Mild, Per incoming records from [facility]   Constipation    Per incoming records from [facility]   Dementia [facility])    Diarrhea    Better off Aricept, Per incoming records from [facility]   Diastolic dysfunction    on 2D Echo 07/2009 and 2016, Per incoming records from [facility]   Diverticulosis    Mild, Per incoming records [facility]   Diverticulosis of colon (without mention of hemorrhage)    ED (erectile dysfunction)    Per incoming records from [facility]   History of Coumadin therapy    Per incoming records from [facility]   History of echocardiogram 09/2014   Per incoming records from [facility]   Hyperlipemia    Hypertension    Kidney mass  Per incoming records from [place] leg edema    Per incoming records from [place] [facility])    Neuropathy    Per incoming records from [facility]   OA (osteoarthritis)    Per incoming records from [facility]   Peripheral neuropathy    Per incoming records from [place] history of colonic polyps 1999 & 2004   adenomatous polyps   Pulmonary arterial hypertension ([place])    Per incoming records from [place]    Per  incoming records from [facility]   Thrombocytopenia [facility]) 2017   Per incoming records from [facility]   UTI (urinary tract infection)    Sepsis, Per incoming records from [place] as ambulation aid    Per incoming records from [facility]   Past Surgical History:  Procedure Laterality Date   APPENDECTOMY     Per incoming records from [place]     Per incoming records from [place]  11/21/2019   KNEE SURGERY     right   PILONIDAL CYST DRAINAGE     POLYPECTOMY     Per incoming records from [place] tumor lumbar spine     SPINE SURGERY     tumor removed   THORACIC 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, Per incoming records from [place]      Allergies  Allergen Reactions   Penicillins Rash    Has patient had a PCN reaction causing immediate rash, facial/tongue/throat swelling, SOB or lightheadedness with hypotension: unknown Has patient had a PCN reaction causing severe rash involving mucus membranes or skin necrosis: unknown Has patient had a PCN reaction that required hospitalization : unknown Has patient had a PCN reaction occurring within the last 10 years: no If all of the above answers are "NO", then may proceed with Cephalosporin use.     Outpatient Encounter Medications as of 05/27/2022  Medication Sig   acetaminophen (TYLENOL) 500 MG tablet Take 1,000 mg by mouth 2 (two) times daily.   cholecalciferol (VITAMIN D3) 25 MCG (1000 UT) tablet Take 2,000 Units by mouth daily.    Cranberry 400 MG CAPS Take 400 mg by mouth every morning. 400 mg, oral, Once a morning   DULoxetine (CYMBALTA) 20 MG capsule Take 20 mg by mouth daily.   furosemide (LASIX) 20 MG tablet Take 2 tablets (40  mg total) by mouth daily. Take extra '20mg'$  for weight gain of 2-3lbs in 1 day or 5 lbs in 1 week   gabapentin (NEURONTIN) 100 MG capsule Take 100 mg by mouth at bedtime.   ketoconazole (NIZORAL) 2 % shampoo Apply 1 Application topically 2 (two) times a week.   methenamine (MANDELAMINE) 1 g tablet Take 1,000 mg by mouth 2 (two) times daily.   mupirocin ointment (BACTROBAN) 2 % Apply 1 Application topically 2 (two) times daily for 7 days.   polyethylene glycol (MIRALAX / GLYCOLAX) 17 g packet Take 17 g by mouth daily.   potassium chloride (MICRO-K) 10 MEQ CR capsule Take 10 mEq by mouth every Tuesday, Thursday, and Saturday at 6 PM. Every Tues., Thurs., Sat. 7:00 am - 3:00 pm, per [facility].   triamcinolone  lotion (KENALOG) 0.1 % Apply 1 application. topically 2 (two) times daily as needed (rash on back).   warfarin (COUMADIN) 4 MG tablet Take 4 mg by mouth daily. Once a day on Mon, Wed, Fri, Sat 4:00 pm - 7:00 pm   warfarin (COUMADIN) 5 MG tablet Take 5 mg by mouth daily. Give PO every Sun, Tues, Thurs 4:00 pm - 7:00 pm   No facility-administered encounter medications on file as of 05/27/2022.    Review of Systems  Unable to perform ROS: Dementia    Immunization History  Administered Date(s) Administered   DTaP 01/20/2022   Influenza, High Dose Seasonal PF 05/04/2020, 04/10/2021   Influenza-Unspecified 04/17/2014, 05/01/2016, 04/15/2019, 04/11/2022   Moderna Covid-19 Vaccine Bivalent Booster 49yr & up 04/17/2021   Moderna SARS-COV2 Booster Vaccination 05/17/2020, 05/12/2022   Moderna Sars-Covid-2 Vaccination 07/12/2019, 08/09/2019, 11/03/2021   Pneumococcal Conjugate-13 04/20/2014   Pneumococcal Polysaccharide-23 07/08/2003, 03/31/2011   Pneumococcal-Unspecified 01/26/2004   Td 08/23/2007   Tdap 01/07/2012, 01/20/2022   Zoster Recombinat (Shingrix) 10/05/2017, 12/09/2017   Zoster, Live 07/17/2005   Pertinent  Health Maintenance Due  Topic Date Due   INFLUENZA VACCINE   Completed      12/13/2021    8:00 AM 12/14/2021    8:00 AM 12/15/2021    9:00 AM 12/16/2021   10:00 AM 05/27/2022    1:32 PM  Fall Risk  Falls in the past year?     0  Was there an injury with Fall?     0  Fall Risk Category Calculator     0  Fall Risk Category     Low  Patient Fall Risk Level High fall risk High fall risk High fall risk High fall risk Low fall risk   Functional Status Survey:    There were no vitals filed for this visit. There is no height or weight on file to calculate BMI. Physical Exam Vitals reviewed.  Constitutional:      General: He is not in acute distress. HENT:     Head: Normocephalic.  Eyes:     General:        Right eye: No discharge.        Left eye: No discharge.  Cardiovascular:     Rate and Rhythm: Normal rate. Rhythm irregular.     Pulses: Normal pulses.     Heart sounds: Normal heart sounds.  Pulmonary:     Effort: Pulmonary effort is normal. No respiratory distress.     Breath sounds: Normal breath sounds. No wheezing or rales.  Abdominal:     General: Bowel sounds are normal. There is no distension.     Palpations: Abdomen is soft.     Tenderness: There is no abdominal tenderness.  Musculoskeletal:     Cervical back: Neck supple.     Right lower leg: No edema.     Left lower leg: No edema.     Comments: Increased non pitting edema from anterior hand extending to left deltoid, some bruising, no skin breakdown, extremity slightly warm to touch, left hand grip 4/5, radial pulse 1+, FROM  Skin:    General: Skin is warm and dry.     Capillary Refill: Capillary refill takes less than 2 seconds.  Neurological:     Mental Status: He is alert. Mental status is at baseline.     Motor: Weakness present.     Gait: Gait abnormal.     Comments: walker  Psychiatric:  Mood and Affect: Mood normal.        Behavior: Behavior normal.     Comments: Very pleasant, follows commands, alert to self/person     Labs reviewed: Recent Labs     12/14/21 0131 12/15/21 0035 12/16/21 0146 12/20/21 0000 01/21/22 0000 02/13/22 2014 04/24/22 0000 05/05/22 0000  NA 138 142 142   < > 138 144 143 143  K 3.5 3.5 3.4*   < > 4.8 4.8 5.0 4.2  CL 104 105 105   < > 105 101 105 106  CO2 '26 26 27   '$ < > 18 25* 26* 27*  GLUCOSE 132* 129* 122*  --   --   --   --   --   BUN 50* 57* 55*   < > 20 22* 25* 27*  CREATININE 1.75* 1.72* 1.48*   < > 1.3 1.4* 1.4* 1.7*  CALCIUM 10.1 10.4* 10.7*   < > 9.9 10.5 10.6  --    < > = values in this interval not displayed.   Recent Labs    09/26/21 0000 12/08/21 1904 12/11/21 2010  AST 11* 14* 16  ALT 6* 8 8  ALKPHOS 100 101 82  BILITOT  --  1.6* 1.9*  PROT  --  6.4* 6.2*  ALBUMIN 4.0 3.6 3.4*   Recent Labs    12/11/21 2010 12/12/21 0650 12/14/21 0131 12/15/21 0035 12/16/21 0146 02/13/22 0000 05/05/22 0000 05/23/22 1657  WBC 5.1   < > 4.1 4.0 3.4* 3.1 2.1 2.3  NEUTROABS 4.1  --   --   --   --   --  1.00 1.30  HGB 13.2   < > 12.6* 12.7* 13.2 13.2* 11.9* 10.5*  HCT 43.1   < > 39.5 40.5 40.3 40* 37* 33*  MCV 90.5   < > 86.4 86.9 86.1  --   --   --   PLT 88*   < > 94* 95* 105* 96* 77* 18*   < > = values in this interval not displayed.   Lab Results  Component Value Date   TSH 0.84 05/19/2019   Lab Results  Component Value Date   HGBA1C 6 08/22/2019   Lab Results  Component Value Date   CHOL 129 08/09/2020   HDL 32 (A) 08/09/2020   LDLCALC 80 08/09/2020   TRIG 86 08/09/2020    Significant Diagnostic Results in last 30 days:  No results found.  Assessment/Plan 1. Traumatic hematoma of left upper arm, subsequent encounter - due to fall 11/14- fell on left side>developed hematoma - left arm xray negative for fracture - left arm swollen from anterior hand to left deltoid, some warmth, no pain/redness, FROM - suspect hematoma slow healing - do not suspect DVT due to recent INR 2.96 11/14 - recommend left arm elevation few times daily   Family/ staff Communication: plan  discussed with patient and nurse  Labs/tests ordered:  none

## 2022-05-28 ENCOUNTER — Encounter: Payer: Self-pay | Admitting: Orthopedic Surgery

## 2022-06-09 ENCOUNTER — Non-Acute Institutional Stay (SKILLED_NURSING_FACILITY): Payer: Medicare Other | Admitting: Internal Medicine

## 2022-06-09 ENCOUNTER — Encounter: Payer: Self-pay | Admitting: Internal Medicine

## 2022-06-09 DIAGNOSIS — F0150 Vascular dementia without behavioral disturbance: Secondary | ICD-10-CM | POA: Diagnosis not present

## 2022-06-09 DIAGNOSIS — R001 Bradycardia, unspecified: Secondary | ICD-10-CM

## 2022-06-09 DIAGNOSIS — Z9359 Other cystostomy status: Secondary | ICD-10-CM

## 2022-06-09 DIAGNOSIS — R5383 Other fatigue: Secondary | ICD-10-CM | POA: Diagnosis not present

## 2022-06-09 DIAGNOSIS — R4182 Altered mental status, unspecified: Secondary | ICD-10-CM | POA: Diagnosis not present

## 2022-06-09 DIAGNOSIS — I4811 Longstanding persistent atrial fibrillation: Secondary | ICD-10-CM | POA: Diagnosis not present

## 2022-06-09 DIAGNOSIS — I5032 Chronic diastolic (congestive) heart failure: Secondary | ICD-10-CM | POA: Diagnosis not present

## 2022-06-09 LAB — CBC AND DIFFERENTIAL
HCT: 35 — AB (ref 41–53)
Hemoglobin: 11.5 — AB (ref 13.5–17.5)
Platelets: 59 10*3/uL — AB (ref 150–400)
WBC: 1.9

## 2022-06-09 LAB — CBC: RBC: 3.99 (ref 3.87–5.11)

## 2022-06-09 LAB — BASIC METABOLIC PANEL
BUN: 26 — AB (ref 4–21)
CO2: 26 — AB (ref 13–22)
Chloride: 108 (ref 99–108)
Creatinine: 1.5 — AB (ref 0.6–1.3)
Glucose: 84
Potassium: 4.2 mEq/L (ref 3.5–5.1)
Sodium: 142 (ref 137–147)

## 2022-06-09 LAB — COMPREHENSIVE METABOLIC PANEL
Calcium: 10 (ref 8.7–10.7)
eGFR: 44

## 2022-06-09 NOTE — Progress Notes (Unsigned)
Location: Occupational psychologist of Service:  SNF (31)  Provider:   Code Status: DNR Goals of Care:     05/23/2022    8:47 AM  Advanced Directives  Does Patient Have a Medical Advance Directive? Yes  Type of Advance Directive Living will;Out of facility DNR (pink MOST or yellow form)     Chief Complaint  Patient presents with   Acute Visit    HPI: Patient is a 88 y.o. male seen today for an acute visit for Lethargy and brady cardia  Lives in SNF   Has h/o A Fib on Coumadin , Chronic Diastolic CHF, HLD  Thrombocytopenia, Has seen Dr Alen Blew in 03/2018 Told to follow  Right Renal Mass seen in [place] in 2018 H/o Urinary Retension s/p Suprapubic Cathter H/o Vascular Dementia MRI in 2020 Cortical Atrophy and Microvascular disease Neuropathy Previous work up has been negative   Gets  Recurent UTI  Patient was more confused this morning and then was more lethargic. He did eat his breakfast but not getting up for lunch Responds to his name but then goes back to sleep    Past Medical History:  Diagnosis Date   Adult failure to thrive    Per incoming records from [facility]   Allergic rhinitis    Per incoming records from [facility]   Atrial fibrillation [facility])    BPH (benign prostatic hyperplasia)    Per incoming records from [place] of kidney [facility])    Right, Per incoming records from [place]    Per [facility] records from [facility]   Cerebrovascular accident, late effects    Per incoming records from [place]   Chronic kidney disease, stage III (moderate) ([place])    Per incoming records from [place] [facility])    Per [facility] records from [facility]   Cognitive impairment    Mild, Per incoming records from [facility]   Constipation    Per incoming records from  [facility]   Dementia [facility])    Diarrhea    Better off Aricept, Per incoming records from [facility]   Diastolic dysfunction    on 2D Echo 07/2009 and 2016, Per incoming records from [facility]   Diverticulosis    Mild, Per incoming records from [facility]   Diverticulosis of colon (without mention of hemorrhage)    ED (erectile dysfunction)    Per incoming records from [facility]   History of Coumadin therapy    Per [facility] records from [facility]   History of echocardiogram 09/2014   Per incoming records from [facility]   Hyperlipemia    Hypertension    Kidney mass    Per incoming records from [place] leg edema    Per incoming records from [place] [facility])    Neuropathy    Per incoming records from [facility]   OA (osteoarthritis)    Per incoming records from [facility]   Peripheral neuropathy    Per incoming records from [place] history of colonic [place] & 2004   adenomatous polyps   Pulmonary arterial hypertension ([place])    Per incoming records from [place]    [facility]   Thrombocytopenia ([place]) 2017   Per incoming records from [facility]   UTI (urinary tract infection)    Sepsis, Per incoming records from [place] as ambulation aid    Per incoming records from [facility]    Past Surgical History:  Procedure Laterality Date   APPENDECTOMY     Per incoming records from [place]     Per incoming records from [place]  11/21/2019   KNEE SURGERY     right   PILONIDAL CYST DRAINAGE      POLYPECTOMY     Per incoming records from [place] tumor lumbar spine     SPINE SURGERY     tumor removed   THORACIC 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, Per incoming records from [place]      Allergies  Allergen Reactions   Penicillins Rash    Has patient had a PCN reaction causing immediate rash, facial/tongue/throat swelling, SOB or lightheadedness with hypotension: unknown Has patient had a PCN reaction causing severe rash involving mucus membranes or skin necrosis: unknown Has patient had a PCN reaction that required hospitalization : unknown Has patient had a PCN reaction occurring within the last 10 years: no If all of the above answers are "NO", then may proceed with Cephalosporin use.     Outpatient Encounter Medications as of 06/09/2022  Medication Sig   acetaminophen (TYLENOL) 500 MG tablet Take 1,000 mg by mouth 2 (two) times daily.   cholecalciferol (VITAMIN D3) 25 MCG (1000 UT) tablet Take 2,000 Units by mouth daily.    Cranberry 400 MG CAPS Take 400 mg by mouth every morning. 400 mg, oral, Once a morning   DULoxetine (CYMBALTA) 20 MG capsule Take 20 mg by mouth daily.   furosemide (LASIX) 20 MG tablet Take 2 tablets (40 mg total) by mouth daily. Take extra '20mg'$  for weight gain of 2-3lbs in 1 day or 5 lbs in 1 week   gabapentin (NEURONTIN) 100 MG capsule Take 200 mg by mouth at bedtime.   ketoconazole (NIZORAL) 2 % shampoo Apply 1 Application topically 2 (two) times a week.   methenamine (MANDELAMINE) 1 g tablet Take 1,000 mg by mouth 2 (two) times daily.   polyethylene glycol (MIRALAX / GLYCOLAX) 17 g packet Take 17 g by mouth daily.   potassium chloride (MICRO-K) 10 MEQ CR capsule Take 10 mEq by mouth every Tuesday, Thursday, and Saturday at 6 PM. Every Tues., Thurs., Sat. 7:00 am - 3:00 pm, per [facility].   triamcinolone lotion (KENALOG) 0.1 %  Apply 1 application. topically 2 (two) times daily as needed (rash on back).   warfarin (COUMADIN) 4 MG tablet Take 4 mg by mouth daily. Once a day on Mon, Wed, Fri, Sat 4:00 pm - 7:00 pm   warfarin (COUMADIN) 5 MG tablet Take 5 mg by mouth daily. Give PO every Sun, Tues, Thurs 4:00 pm - 7:00 pm   No facility-administered encounter medications on file as of 06/09/2022.    Review of Systems:  Review of Systems  Unable to perform ROS: Mental status change    Health Maintenance  Topic Date Due   Medicare Annual Wellness (AWV)  07/05/2022   COVID-19 Vaccine (5 - 2023-24 season) 07/07/2022   DTaP/Tdap/Td (5 -  Td or Tdap) 01/21/2032   Pneumonia Vaccine 6+ Years old  Completed   INFLUENZA VACCINE  Completed   Zoster Vaccines- Shingrix  Completed   HPV VACCINES  Aged Out    Physical Exam: There were no vitals filed for this visit. There is no height or weight on file to calculate BMI. Physical Exam Vitals reviewed.  Constitutional:      Comments: Lethargic  HENT:     Head: Normocephalic.     Nose: Nose normal.     Mouth/Throat:     Mouth: Mucous membranes are moist.     Pharynx: Oropharynx is clear.  Eyes:     Pupils: Pupils are equal, round, and reactive to light.  Cardiovascular:     Rate and Rhythm: Normal rate and regular rhythm.     Pulses: Normal pulses.     Heart sounds: No murmur heard. Pulmonary:     Effort: Pulmonary effort is normal. No respiratory distress.     Breath sounds: Normal breath sounds. No rales.  Abdominal:     General: Abdomen is flat. Bowel sounds are normal.     Palpations: Abdomen is soft.  Musculoskeletal:        General: Swelling present.     Cervical back: Neck supple.  Skin:    General: Skin is warm.  Neurological:     General: No focal deficit present.  Psychiatric:        Mood and Affect: Mood normal.        Thought Content: Thought content normal.     Labs reviewed: Basic Metabolic Panel: Recent Labs    12/14/21 0131  12/15/21 0035 12/16/21 0146 12/20/21 0000 01/21/22 0000 02/13/22 2014 04/24/22 0000 05/05/22 0000  NA 138 142 142   < > 138 144 143 143  K 3.5 3.5 3.4*   < > 4.8 4.8 5.0 4.2  CL 104 105 105   < > 105 101 105 106  CO2 '26 26 27   '$ < > 18 25* 26* 27*  GLUCOSE 132* 129* 122*  --   --   --   --   --   BUN 50* 57* 55*   < > 20 22* 25* 27*  CREATININE 1.75* 1.72* 1.48*   < > 1.3 1.4* 1.4* 1.7*  CALCIUM 10.1 10.4* 10.7*   < > 9.9 10.5 10.6  --    < > = values in this interval not displayed.   Liver Function Tests: Recent Labs    09/26/21 0000 12/08/21 1904 12/11/21 2010  AST 11* 14* 16  ALT 6* 8 8  ALKPHOS 100 101 82  BILITOT  --  1.6* 1.9*  PROT  --  6.4* 6.2*  ALBUMIN 4.0 3.6 3.4*   No results for input(s): "LIPASE", "AMYLASE" in the last 8760 hours. No results for input(s): "AMMONIA" in the last 8760 hours. CBC: Recent Labs    12/11/21 2010 12/12/21 0650 12/14/21 0131 12/15/21 0035 12/16/21 0146 02/13/22 0000 05/05/22 0000 05/23/22 1657  WBC 5.1   < > 4.1 4.0 3.4* 3.1 2.1 2.3  NEUTROABS 4.1  --   --   --   --   --  1.00 1.30  HGB 13.2   < > 12.6* 12.7* 13.2 13.2* 11.9* 10.5*  HCT 43.1   < > 39.5 40.5 40.3 40* 37* 33*  MCV 90.5   < > 86.4 86.9 86.1  --   --   --   PLT 88*   < > 94*  95* 105* 96* 77* 18*   < > = values in this interval not displayed.   Lipid Panel: No results for input(s): "CHOL", "HDL", "LDLCALC", "TRIG", "CHOLHDL", "LDLDIRECT" in the last 8760 hours. Lab Results  Component Value Date   HGBA1C 6 08/22/2019    Procedures since last visit: No results found.  Assessment/Plan 1. Lethargy Stat Labs CBC,CMP Has MOST form not transfer to ED  2. Bradycardia EKG and Check Mag level  Addendum EKG showed A Fib No AV blocks Mag Level was normal All other labs were normal Continues to have Pancytopenia Patient woke up later and was back to his baseline at night   3. Chronic diastolic heart failure (HCC) On Lasix   4. Longstanding persistent  atrial fibrillation (HCC) On Coumadin  5. Vascular dementia without behavioral disturbance ([place]) Needs SNF level care Has MOST form with no aggressive treatment  6. Suprapubic catheter ([place]) with Recurent UTI On Methenamine Check UA and Culture    Labs/tests ordered:  * No order type specified * Next appt:  Visit date not found

## 2022-06-11 DIAGNOSIS — R4182 Altered mental status, unspecified: Secondary | ICD-10-CM | POA: Diagnosis not present

## 2022-06-13 ENCOUNTER — Telehealth: Payer: Medicare Other | Admitting: Adult Health

## 2022-06-13 DIAGNOSIS — N3000 Acute cystitis without hematuria: Secondary | ICD-10-CM

## 2022-06-13 MED ORDER — CEFPODOXIME PROXETIL 100 MG PO TABS: 100.0000 mg | ORAL_TABLET | Freq: Two times a day (BID) | ORAL | 0 refills | Status: DC

## 2022-06-13 NOTE — Telephone Encounter (Signed)
UA returned with >100,000 colonies of gram neg rods. Sensitivity pending. Will start cefpodoxime. Pt is allergic to pcn but has tolerated cephalosporins in the past.

## 2022-06-23 DIAGNOSIS — Z7901 Long term (current) use of anticoagulants: Secondary | ICD-10-CM | POA: Diagnosis not present

## 2022-06-23 LAB — PROTIME-INR: Protime: 22.3 — AB (ref 10.0–13.8)

## 2022-06-23 LAB — POCT INR: INR: 2.22 — AB (ref 0.80–1.20)

## 2022-07-10 ENCOUNTER — Encounter: Payer: Self-pay | Admitting: Adult Health

## 2022-07-10 NOTE — Progress Notes (Signed)
Location:  [facility] Room Number: 142-A Place of Service:  SNF [phone]) Provider:  [name] NP  [name], MD  [name], [name] [name], MD as PCP - Cardiology (Cardiology)  Extended Emergency Contact Information Primary Emergency Contact: Wages,[name] Address: [address]          [place], [place] [address] [name] of [place] Phone: [phone] Mobile Phone: [phone] Relation: Spouse Secondary Emergency Contact: [name],[name] Address: [address]          [place], [place] [address] [place] Mobile Phone: [phone] Relation: Daughter  Code Status:  DNR Pallative care Goals of care: Advanced Directive information    05/23/2022    8:47 AM  Advanced Directives  Does Patient Have a Medical Advance Directive? Yes  Type of Advance Directive Living will;Out of facility DNR (pink MOST or yellow form)     Chief Complaint  Patient presents with   Medical Management of Chronic Issues    Patient is being seen for routine visit    Quality Metric Gaps    Discuss the need for update covid vaccine and AWV    HPI:  Pt is a 89 y.o. male seen today for medical management of chronic diseases.     Past Medical History:  Diagnosis Date   Adult failure to thrive    Per incoming records from [facility]   Allergic rhinitis    Per incoming records from [facility]   Atrial fibrillation [facility])    BPH (benign prostatic hyperplasia)    Per incoming records from [place] of [place] [facility])    Right, Per incoming records from [place]    Per incoming records from [facility]   Cerebrovascular accident, late effects    Per incoming records from [place] kidney disease, stage III (moderate) ([place])    Per  incoming records from [facility])    Per incoming records from [facility]   Cognitive impairment    Mild, Per incoming records from [facility]   Constipation    Per incoming records from [facility]   Dementia [facility])    Diarrhea    Better off Aricept, Per incoming records from [facility]   Diastolic dysfunction    on 2D Echo 07/2009 and 2016, Per incoming records from [facility]   Diverticulosis    Mild, Per incoming records from [facility]   Diverticulosis of colon (without mention of hemorrhage)    ED (erectile dysfunction)    Per incoming records from [facility]   History of Coumadin therapy    Per incoming records from [facility]   History of echocardiogram 09/2014   Per incoming records from [facility]   Hyperlipemia    Hypertension    Kidney mass    Per incoming records from [place] leg edema    Per incoming records from [facility])    Neuropathy    Per incoming records from [facility]   OA (osteoarthritis)    Per incoming records from [facility]   Peripheral neuropathy    Per incoming records from [place] history of colonic polyps  1999 & 2004   adenomatous polyps   Pulmonary arterial hypertension ([place])    Per incoming records from [place]    Per incoming records from [facility]   Thrombocytopenia [facility]) 2017   Per incoming records from [facility]   UTI (urinary tract infection)    Sepsis, Per incoming records from [place] hypertrophy    Gilford Rile as ambulation aid    Per incoming records from [facility]   Past Surgical History:  Procedure  Laterality Date   APPENDECTOMY     Per incoming records from [place]     Per incoming records from [place]  11/21/2019   KNEE SURGERY     right   PILONIDAL CYST DRAINAGE     POLYPECTOMY     Per incoming records from [place] tumor lumbar spine     SPINE SURGERY     tumor removed   THORACIC 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, Per incoming records from [place]      Allergies  Allergen Reactions   Penicillins Rash    Has patient had a PCN reaction causing immediate rash, facial/tongue/throat swelling, SOB or lightheadedness with hypotension: unknown Has patient had a PCN reaction causing severe rash involving mucus membranes or skin necrosis: unknown Has patient had a PCN reaction that required hospitalization : unknown Has patient had a PCN reaction occurring within the last 10 years: no If all of the above answers are "NO", then may proceed with Cephalosporin use.     Outpatient Encounter Medications as of 07/10/2022  Medication Sig   acetaminophen (TYLENOL) 500 MG tablet Take 1,000 mg by mouth 2 (two) times daily.   cholecalciferol (VITAMIN D3) 25 MCG (1000 UT) tablet Take 2,000 Units by mouth daily.    Cranberry 400 MG CAPS Take 400 mg by mouth every morning. 400 mg, oral, Once a morning   DULoxetine (CYMBALTA) 20 MG capsule Take 20 mg by mouth daily.   furosemide (LASIX) 20 MG tablet Take 2 tablets (40 mg total) by mouth daily. Take extra '20mg'$  for weight gain of 2-3lbs in 1 day or 5 lbs in 1 week   gabapentin (NEURONTIN) 100 MG capsule Take 200 mg by mouth at bedtime.   ketoconazole (NIZORAL) 2 % shampoo Apply 1 Application topically 2 (two) times a week.   polyethylene glycol (MIRALAX / GLYCOLAX) 17 g packet Take 17 g by mouth daily.   potassium chloride (MICRO-K) 10 MEQ CR capsule  Take 10 mEq by mouth every Tuesday, Thursday, and Saturday at 6 PM. Every Tues., Thurs., Sat. 7:00 am - 3:00 pm, per [facility].   triamcinolone lotion (KENALOG) 0.1 % Apply 1 application. topically 2 (two) times daily as needed (rash on back).   warfarin (COUMADIN) 4 MG tablet Take 4 mg by mouth daily. Once a day on Mon, Wed, Fri, Sat 4:00 pm - 7:00 pm   warfarin (COUMADIN) 5 MG tablet Take 5 mg by mouth daily. Give PO every Sun, Tues, Thurs 4:00 pm - 7:00 pm   [DISCONTINUED] cefpodoxime (VANTIN) 100 MG tablet Take 1 tablet (100 mg total) by mouth 2 (two) times daily.   No facility-administered encounter medications on file as of 07/10/2022.    Review of Systems  Immunization History  Administered  Date(s) Administered   DTaP 01/20/2022   Influenza, High Dose Seasonal PF 05/04/2020, 04/10/2021   Influenza-Unspecified 04/17/2014, 05/01/2016, 04/15/2019, 04/11/2022   Moderna Covid-19 Vaccine Bivalent Booster 5yr & up 04/17/2021   Moderna SARS-COV2 Booster Vaccination 05/17/2020, 05/12/2022   Moderna Sars-Covid-2 Vaccination 07/12/2019, 08/09/2019, 11/03/2021   Pneumococcal Conjugate-13 04/20/2014   Pneumococcal Polysaccharide-23 07/08/2003, 03/31/2011   Pneumococcal-Unspecified 01/26/2004   Td 08/23/2007   Tdap 01/07/2012, 01/20/2022   Zoster Recombinat (Shingrix) 10/05/2017, 12/09/2017   Zoster, Live 07/17/2005   Pertinent  Health Maintenance Due  Topic Date Due   INFLUENZA VACCINE  Completed      12/13/2021    8:00 AM 12/14/2021    8:00 AM 12/15/2021    9:00 AM 12/16/2021   10:00 AM 05/27/2022    1:32 PM  Fall Risk  Falls in the past year?     0  Was there an injury with Fall?     0  Fall Risk Category Calculator     0  Fall Risk Category     Low  Patient Fall Risk Level High fall risk High fall risk High fall risk High fall risk Low fall risk   Functional Status Survey:    Vitals:   07/10/22 1117  BP: (!) 154/68  Pulse: 67  Resp: 17  Temp: (!) 97.3 F (36.3  C)  TempSrc: Temporal  SpO2: 95%  Weight: 262 lb 12.8 oz (119.2 kg)  Height: '6\' 7"'$  (2.007 m)   Body mass index is 29.61 kg/m. Physical Exam  Labs reviewed: Recent Labs    12/14/21 0131 12/15/21 0035 12/16/21 0146 12/20/21 0000 01/21/22 0000 02/13/22 2014 04/24/22 0000 05/05/22 0000  NA 138 142 142   < > 138 144 143 143  K 3.5 3.5 3.4*   < > 4.8 4.8 5.0 4.2  CL 104 105 105   < > 105 101 105 106  CO2 '26 26 27   '$ < > 18 25* 26* 27*  GLUCOSE 132* 129* 122*  --   --   --   --   --   BUN 50* 57* 55*   < > 20 22* 25* 27*  CREATININE 1.75* 1.72* 1.48*   < > 1.3 1.4* 1.4* 1.7*  CALCIUM 10.1 10.4* 10.7*   < > 9.9 10.5 10.6  --    < > = values in this interval not displayed.   Recent Labs    09/26/21 0000 12/08/21 1904 12/11/21 2010  AST 11* 14* 16  ALT 6* 8 8  ALKPHOS 100 101 82  BILITOT  --  1.6* 1.9*  PROT  --  6.4* 6.2*  ALBUMIN 4.0 3.6 3.4*   Recent Labs    12/11/21 2010 12/12/21 0650 12/14/21 0131 12/15/21 0035 12/16/21 0146 02/13/22 0000 05/05/22 0000 05/23/22 1657  WBC 5.1   < > 4.1 4.0 3.4* 3.1 2.1 2.3  NEUTROABS 4.1  --   --   --   --   --  1.00 1.30  HGB 13.2   < > 12.6* 12.7* 13.2 13.2* 11.9* 10.5*  HCT 43.1   < > 39.5 40.5 40.3 40* 37* 33*  MCV 90.5   < > 86.4 86.9 86.1  --   --   --   PLT 88*   < > 94* 95* 105* 96* 77* 18*   < > = values in this interval not displayed.   Lab Results  Component Value Date   TSH 0.84 05/19/2019   Lab Results  Component Value Date   HGBA1C 6 08/22/2019   Lab Results  Component Value Date   CHOL 129 08/09/2020   HDL 32 (A) 08/09/2020   LDLCALC 80 08/09/2020   TRIG 86 08/09/2020    Significant Diagnostic Results in last 30 days:  No results found.  Assessment/Plan There are no diagnoses linked to this encounter.     This encounter was created in error - please disregard.

## 2022-07-14 ENCOUNTER — Telehealth: Payer: Self-pay | Admitting: Family Medicine

## 2022-07-14 NOTE — Telephone Encounter (Signed)
TCT patient's wife to see if the patient has any needs as she had requested not to visit unless there was a need. Her mobile phone does not have a voice mailbox set up to leave a message. Damaris Hippo FNP-C

## 2022-07-21 DIAGNOSIS — Z7901 Long term (current) use of anticoagulants: Secondary | ICD-10-CM | POA: Diagnosis not present

## 2022-07-21 LAB — PROTIME-INR
Protime: 24.5 — AB (ref 10.0–13.8)
Protime: 24.5 — AB (ref 10.0–13.8)

## 2022-07-21 LAB — POCT INR
INR: 2.46 — AB (ref 0.80–1.20)
INR: 2.46 — AB (ref 0.80–1.20)

## 2022-07-27 ENCOUNTER — Emergency Department (HOSPITAL_COMMUNITY): Payer: Medicare Other

## 2022-07-27 ENCOUNTER — Other Ambulatory Visit: Payer: Self-pay

## 2022-07-27 ENCOUNTER — Encounter (HOSPITAL_COMMUNITY): Payer: Self-pay | Admitting: Emergency Medicine

## 2022-07-27 ENCOUNTER — Emergency Department (HOSPITAL_COMMUNITY)
Admission: EM | Admit: 2022-07-27 | Discharge: 2022-07-28 | Disposition: A | Discharge status: Home / Self Care | Payer: Medicare Other | Attending: Emergency Medicine | Admitting: Emergency Medicine

## 2022-07-27 DIAGNOSIS — S3993XA Unspecified injury of pelvis, initial encounter: Secondary | ICD-10-CM | POA: Diagnosis not present

## 2022-07-27 DIAGNOSIS — F0390 Unspecified dementia without behavioral disturbance: Secondary | ICD-10-CM | POA: Insufficient documentation

## 2022-07-27 DIAGNOSIS — M19011 Primary osteoarthritis, right shoulder: Secondary | ICD-10-CM | POA: Diagnosis not present

## 2022-07-27 DIAGNOSIS — Y9301 Activity, walking, marching and hiking: Secondary | ICD-10-CM | POA: Diagnosis not present

## 2022-07-27 DIAGNOSIS — S0181XA Laceration without foreign body of other part of head, initial encounter: Secondary | ICD-10-CM | POA: Diagnosis not present

## 2022-07-27 DIAGNOSIS — T1490XA Injury, unspecified, initial encounter: Secondary | ICD-10-CM | POA: Diagnosis not present

## 2022-07-27 DIAGNOSIS — S40211A Abrasion of right shoulder, initial encounter: Secondary | ICD-10-CM | POA: Diagnosis not present

## 2022-07-27 DIAGNOSIS — Z7901 Long term (current) use of anticoagulants: Secondary | ICD-10-CM | POA: Diagnosis not present

## 2022-07-27 DIAGNOSIS — D72819 Decreased white blood cell count, unspecified: Secondary | ICD-10-CM | POA: Insufficient documentation

## 2022-07-27 DIAGNOSIS — Z1152 Encounter for screening for COVID-19: Secondary | ICD-10-CM | POA: Insufficient documentation

## 2022-07-27 DIAGNOSIS — D649 Anemia, unspecified: Secondary | ICD-10-CM | POA: Insufficient documentation

## 2022-07-27 DIAGNOSIS — R58 Hemorrhage, not elsewhere classified: Secondary | ICD-10-CM | POA: Diagnosis not present

## 2022-07-27 DIAGNOSIS — Y92001 Dining room of unspecified non-institutional (private) residence as the place of occurrence of the external cause: Secondary | ICD-10-CM | POA: Insufficient documentation

## 2022-07-27 DIAGNOSIS — R9431 Abnormal electrocardiogram [ECG] [EKG]: Secondary | ICD-10-CM | POA: Diagnosis not present

## 2022-07-27 DIAGNOSIS — M25561 Pain in right knee: Secondary | ICD-10-CM | POA: Diagnosis not present

## 2022-07-27 DIAGNOSIS — S0101XA Laceration without foreign body of scalp, initial encounter: Secondary | ICD-10-CM

## 2022-07-27 DIAGNOSIS — S0990XA Unspecified injury of head, initial encounter: Secondary | ICD-10-CM | POA: Diagnosis not present

## 2022-07-27 DIAGNOSIS — S299XXA Unspecified injury of thorax, initial encounter: Secondary | ICD-10-CM | POA: Diagnosis not present

## 2022-07-27 DIAGNOSIS — Z7401 Bed confinement status: Secondary | ICD-10-CM | POA: Diagnosis not present

## 2022-07-27 DIAGNOSIS — R5383 Other fatigue: Secondary | ICD-10-CM | POA: Diagnosis not present

## 2022-07-27 DIAGNOSIS — R220 Localized swelling, mass and lump, head: Secondary | ICD-10-CM | POA: Diagnosis not present

## 2022-07-27 DIAGNOSIS — W19XXXA Unspecified fall, initial encounter: Secondary | ICD-10-CM | POA: Diagnosis not present

## 2022-07-27 DIAGNOSIS — M19012 Primary osteoarthritis, left shoulder: Secondary | ICD-10-CM | POA: Diagnosis not present

## 2022-07-27 DIAGNOSIS — S4991XA Unspecified injury of right shoulder and upper arm, initial encounter: Secondary | ICD-10-CM | POA: Diagnosis not present

## 2022-07-27 DIAGNOSIS — I517 Cardiomegaly: Secondary | ICD-10-CM | POA: Diagnosis not present

## 2022-07-27 LAB — COMPREHENSIVE METABOLIC PANEL
ALT: 8 U/L (ref 0–44)
AST: 42 U/L — ABNORMAL HIGH (ref 15–41)
Albumin: 3.8 g/dL (ref 3.5–5.0)
Alkaline Phosphatase: 100 U/L (ref 38–126)
Anion gap: 7 (ref 5–15)
BUN: 26 mg/dL — ABNORMAL HIGH (ref 8–23)
CO2: 26 mmol/L (ref 22–32)
Calcium: 10.4 mg/dL — ABNORMAL HIGH (ref 8.9–10.3)
Chloride: 107 mmol/L (ref 98–111)
Creatinine, Ser: 1.54 mg/dL — ABNORMAL HIGH (ref 0.61–1.24)
GFR, Estimated: 43 mL/min — ABNORMAL LOW (ref >60)
Glucose, Bld: 107 mg/dL — ABNORMAL HIGH (ref 70–99)
Potassium: 4.5 mmol/L (ref 3.5–5.1)
Sodium: 140 mmol/L (ref 135–145)
Total Bilirubin: 0.9 mg/dL (ref 0.3–1.2)
Total Protein: 6.7 g/dL (ref 6.5–8.1)

## 2022-07-27 LAB — CBC
HCT: 38.9 % — ABNORMAL LOW (ref 39.0–52.0)
Hemoglobin: 11.3 g/dL — ABNORMAL LOW (ref 13.0–17.0)
MCH: 27.4 pg (ref 26.0–34.0)
MCHC: 29 g/dL — ABNORMAL LOW (ref 30.0–36.0)
MCV: 94.2 fL (ref 80.0–100.0)
Platelets: 74 10*3/uL — ABNORMAL LOW (ref 150–400)
RBC: 4.13 MIL/uL — ABNORMAL LOW (ref 4.22–5.81)
RDW: 18.8 % — ABNORMAL HIGH (ref 11.5–15.5)
WBC: 2.5 10*3/uL — ABNORMAL LOW (ref 4.0–10.5)
nRBC: 0 % (ref 0.0–0.2)

## 2022-07-27 LAB — SAMPLE TO BLOOD BANK

## 2022-07-27 LAB — RESP PANEL BY RT-PCR (RSV, FLU A&B, COVID)  RVPGX2
Influenza A by PCR: NEGATIVE
Influenza B by PCR: NEGATIVE
Resp Syncytial Virus by PCR: NEGATIVE
SARS Coronavirus 2 by RT PCR: NEGATIVE

## 2022-07-27 LAB — PROTIME-INR
INR: 2.8 — ABNORMAL HIGH (ref 0.8–1.2)
Prothrombin Time: 29 seconds — ABNORMAL HIGH (ref 11.4–15.2)

## 2022-07-27 LAB — ETHANOL: Alcohol, Ethyl (B): <10 mg/dL (ref <10)

## 2022-07-27 LAB — LACTIC ACID, PLASMA: Lactic Acid, Venous: 1.5 mmol/L (ref 0.5–1.9)

## 2022-07-27 MED ORDER — FENTANYL CITRATE PF 50 MCG/ML IJ SOSY
25.0000 ug | PREFILLED_SYRINGE | Freq: Once | INTRAMUSCULAR | Status: AC
  Administered 2022-07-27: 25 ug via INTRAVENOUS
  Filled 2022-07-27: qty 1

## 2022-07-27 MED ORDER — LIDOCAINE-EPINEPHRINE (PF) 2 %-1:200000 IJ SOLN
10.0000 mL | Freq: Once | INTRAMUSCULAR | Status: AC
  Administered 2022-07-27: 10 mL
  Filled 2022-07-27: qty 20

## 2022-07-27 NOTE — ED Notes (Signed)
Trauma Response Nurse Documentation  Peter Singh is a 89 y.o. male arriving to [facility] ED via EMS  On warfarin daily. Trauma was activated as a Level 2 based on the following trauma criteria Elderly patients > 65 with head trauma on anti-coagulation (excluding ASA). Trauma team at the bedside on patient arrival.   Patient cleared for CT by Dr. Armandina Singh. Pt transported to CT with trauma response nurse present to monitor. RN remained with the patient throughout their absence from the department for clinical observation. GCS 14.  History   Past Medical History:  Diagnosis Date   Adult failure to thrive    Per incoming records from [facility]   Allergic rhinitis    Per incoming records from [facility]   Atrial fibrillation [facility])    BPH (benign prostatic hyperplasia)    Per incoming records from [place] of kidney [facility])    Right, Per incoming records from [place]    Per incoming records from [facility]   Cerebrovascular accident, late effects    Per incoming records from [place] kidney disease, stage III (moderate) ([place])    Per incoming records from [facility])    Per incoming records from [facility]   Cognitive impairment    Mild, Per incoming records from [facility]   Constipation    Per incoming records from [facility]   Dementia [facility])    Diarrhea    Better off Aricept, Per incoming records from [facility]   Diastolic dysfunction    on 2D Echo 07/2009 and 2016, Per incoming records from [facility]   Diverticulosis    Mild, Per incoming records from [facility]   Diverticulosis of colon (without mention of hemorrhage)    ED (erectile dysfunction)    Per [facility] records from [facility]   History of Coumadin therapy    [facility]   History of echocardiogram 09/2014   Per incoming records from [facility]   Hyperlipemia    Hypertension    Kidney mass    Per incoming records from [place] leg edema    Per incoming records from [facility])    Neuropathy    Per incoming records from [facility]   OA (osteoarthritis)    Per incoming records from [facility]   Peripheral neuropathy    Per incoming records from [place] history of colonic [place] & 2004   adenomatous polyps   Pulmonary arterial hypertension ([place])    Per incoming records from [place]    Per [facility] records from [facility]   Thrombocytopenia [facility]) 2017   Per incoming records from [facility]   UTI (urinary tract infection)    Sepsis, Per incoming records from [place] as ambulation aid    Per incoming records from [facility]     Past Surgical History:  Procedure Laterality Date   APPENDECTOMY     Per incoming records from [place]     Per incoming records from [place]  11/21/2019  KNEE SURGERY     right   PILONIDAL CYST DRAINAGE     POLYPECTOMY     Per incoming records from [place] tumor lumbar spine     SPINE SURGERY     tumor removed   THORACIC 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, Per incoming records from [place]       Initial Focused Assessment (If applicable, or please see trauma documentation): Patient alert and oriented to self, at baseline per wife at  bedside Does have baseline cognitive impairment, from [facility] PERR 3, GCS 14 Abrasion to right forehead, bleeding controlled with gauze Abrasion to right shoulder No other traumatic injury identified  CT's Completed:   CT Head and CT C-Spine   Interventions:  IV, trauma labs CXR/PXR/Rshoulder/Knee CT Head/Cspine 30mg Fent  Plan for disposition:  Discharge home to [facility]  Event Summary: Patient from Peter Singh had a fall from standing. Takes coumadin for atrial fibrillation. Imaging was ordered and revealed no traumatic injury. Laceration to R forehead repaired by EDP. Patient to discharge back to [facility], wife at bedside.  Bedside handoff with ED RN Peter Singh.    Peter Singh  Trauma Response RN  Please call TRN at [phone] further assistance.

## 2022-07-27 NOTE — ED Notes (Signed)
Estill Bamberg TRN transported pt to CT

## 2022-07-27 NOTE — ED Notes (Signed)
Pt has a laceration to the right side of forehead.  Pt has abrasion to the right shoulder posterior.

## 2022-07-27 NOTE — ED Provider Notes (Signed)
[facility] Provider Note   CSN: [id number] Arrival date & time: 07/27/22  1454     History  Chief Complaint  Patient presents with   Fall    -Level 2 (On Thinners)    Peter Singh is a 89 y.o. male.  HPI   89 year old male with medical history significant for dementia, diverticulosis, atrial fibrillation on Coumadin who presents to the emergency department from his [facility] due to a fall.  The patient was walking out of the dining room and felt unsteady on his feet and subsequently fell onto his right side from standing.  He sustained a laceration to his right forehead.  He is on warfarin.  He denies any loss of consciousness.  He also sustained an abrasion to the right posterior shoulder.  He endorses right shoulder pain and right knee pain.  He arrived to the emerged part GCS 14, ABC intact.  Home Medications Prior to Admission medications   Medication Sig Start Date End Date Taking? Authorizing Provider  acetaminophen (TYLENOL) 500 MG tablet Take 1,000 mg by mouth 2 (two) times daily.    [provider]  cholecalciferol (VITAMIN D3) 25 MCG (1000 UT) tablet Take 2,000 Units by mouth daily.     [provider]  Cranberry 400 MG CAPS Take 400 mg by mouth every morning. 400 mg, oral, Once a morning    [provider]  DULoxetine (CYMBALTA) 20 MG capsule Take 20 mg by mouth daily.    [provider]  furosemide (LASIX) 20 MG tablet Take 2 tablets (40 mg total) by mouth daily. Take extra '20mg'$  for weight gain of 2-3lbs in 1 day or 5 lbs in 1 week 12/16/21   Domenic Polite, MD  gabapentin (NEURONTIN) 100 MG capsule Take 200 mg by mouth at bedtime. 03/26/22   [provider]  ketoconazole (NIZORAL) 2 % shampoo Apply 1 Application topically 2 (two) times a week.    [provider]  polyethylene glycol (MIRALAX / GLYCOLAX) 17 g packet Take 17 g by mouth daily.    [provider]  potassium chloride (MICRO-K) 10 MEQ CR capsule Take 10 mEq by mouth every Tuesday, Thursday, and Saturday at 6 PM. Every Tues., Thurs., Sat. 7:00 am - 3:00 pm, per [facility].    [provider]  triamcinolone lotion (KENALOG) 0.1 % Apply 1 application. topically 2 (two) times daily as needed (rash on back).    [provider]  warfarin (COUMADIN) 4 MG tablet Take 4 mg by mouth daily. Once a day on Mon, Wed, Fri, Sat 4:00 pm - 7:00 pm    [provider]  warfarin (COUMADIN) 5 MG tablet Take 5 mg by mouth daily. Give PO every Sun, Tues, Thurs 4:00 pm - 7:00 pm    [provider]      Allergies    Penicillins    Review of Systems   Review of Systems  Unable to perform ROS: Dementia    Physical Exam Updated Vital Signs BP (!) 167/58   Pulse (!) 42   Temp 98.3 F (36.8 C) (Oral)   Resp 15   Ht '6\' 7"'$  (2.007 m)   Wt 104.3 kg   SpO2 100%   BMI 25.91 kg/m  Physical Exam Vitals and nursing note reviewed.  Constitutional:      Appearance: He is well-developed.     Comments: GCS 15, ABC intact  HENT:  Head: Normocephalic.     Comments: 1 cm laceration to the right forehead, hemostatic with gauze Eyes:     Conjunctiva/sclera: Conjunctivae normal.  Neck:     Comments: No midline tenderness to palpation of the cervical spine. ROM intact. Cardiovascular:     Rate and Rhythm: Normal rate and regular rhythm.  Pulmonary:     Effort: Pulmonary effort is normal. No respiratory distress.     Breath sounds: Normal breath sounds.  Chest:     Comments: Chest wall stable and non-tender to AP and lateral compression. Clavicles stable and non-tender to AP compression Abdominal:     Palpations: Abdomen is soft.     Tenderness: There is no abdominal tenderness.     Comments: Pelvis stable to lateral compression.  Musculoskeletal:     Cervical back: Neck supple.     Comments: No midline tenderness to palpation of the thoracic or  lumbar spine.  Abrasion to the right posterior shoulder, mild tenderness to palpation of the shoulder, tenderness of the right knee, range of motion intact of both joints.  Skin:    General: Skin is warm and dry.  Neurological:     Mental Status: He is alert.     Comments: CN II-XII grossly intact. Moving all four extremities spontaneously and sensation grossly intact.     ED Results / Procedures / Treatments   Labs (all labs ordered are listed, but only abnormal results are displayed) Labs Reviewed  COMPREHENSIVE METABOLIC PANEL - Abnormal; Notable for the following components:      Result Value   Glucose, Bld 107 (*)    BUN 26 (*)    Creatinine, Ser 1.54 (*)    Calcium 10.4 (*)    AST 42 (*)    GFR, Estimated 43 (*)    All other components within normal limits  CBC - Abnormal; Notable for the following components:   WBC 2.5 (*)    RBC 4.13 (*)    Hemoglobin 11.3 (*)    HCT 38.9 (*)    MCHC 29.0 (*)    RDW 18.8 (*)    Platelets 74 (*)    All other components within normal limits  PROTIME-INR - Abnormal; Notable for the following components:   Prothrombin Time 29.0 (*)    INR 2.8 (*)    All other components within normal limits  RESP PANEL BY RT-PCR (RSV, FLU A&B, COVID)  [id number]  ETHANOL  LACTIC ACID, PLASMA  SAMPLE TO BLOOD BANK    EKG EKG Interpretation  Date/Time:  Sunday July 27 2022 15:02:59 EST Ventricular Rate:  74 PR Interval:    QRS Duration: 111 QT Interval:  428 QTC Calculation: 475 R Axis:   -71 Text Interpretation: Atrial fibrillation Premature ventricular complexes Inferior infarct, old Anterior infarct, old Lateral leads are also involved Confirmed by Regan Lemming ([id number]) on 07/27/2022 3:04:29 PM  Radiology DG Clavicle Left  Result Date: 07/27/2022 CLINICAL DATA:  Evaluate for possible clavicle fracture. EXAM: LEFT CLAVICLE - 2+ VIEWS COMPARISON:  CT head and cervical spine earlier same day FINDINGS: No evidence for clavicle fracture. AC joint  and glenohumeral joint degenerative changes. Visualized left hemithorax is unremarkable. IMPRESSION: No evidence for clavicle fracture. Degenerative changes. Electronically Signed   By: Lovey Newcomer M.D.   On: 07/27/2022 17:49   DG Shoulder Right Port  Result Date: 07/27/2022 CLINICAL DATA:  Trauma EXAM: RIGHT SHOULDER - 1 VIEW COMPARISON:  None Available. FINDINGS: There is no evidence of fracture or dislocation.  There is moderate acromioclavicular and glenohumeral joint space narrowing and osteophyte formation compatible with degenerative change. Soft tissues are unremarkable. IMPRESSION: 1. No evidence of fracture or dislocation. 2. Moderate degenerative changes. Electronically Signed   By: Ronney Asters M.D.   On: 07/27/2022 17:03   DG Knee Right Port  Result Date: 07/27/2022 CLINICAL DATA:  Fall with right knee pain. EXAM: PORTABLE RIGHT KNEE - 1-2 VIEW COMPARISON:  None Available. FINDINGS: Mild tricompartmental osteoarthritic change. Chondrocalcinosis over the lateral compartment. Suggestion of a small joint effusion. No evidence of acute fracture or dislocation. Remainder of the exam is unremarkable. IMPRESSION: 1. No acute fracture. 2. Mild osteoarthritic change. Small joint effusion. Electronically Signed   By: Marin Olp M.D.   On: 07/27/2022 16:25   CT HEAD WO CONTRAST  Result Date: 07/27/2022 CLINICAL DATA:  Trauma. EXAM: CT HEAD WITHOUT CONTRAST CT CERVICAL SPINE WITHOUT CONTRAST TECHNIQUE: Multidetector CT imaging of the head and cervical spine was performed following the standard protocol without intravenous contrast. Multiplanar CT image reconstructions of the cervical spine were also generated. RADIATION DOSE REDUCTION: This exam was performed according to the departmental dose-optimization program which includes automated exposure control, adjustment of the mA and/or kV according to patient size and/or use of iterative reconstruction technique. COMPARISON:  Brain CT 05/01/2019  FINDINGS: CT HEAD FINDINGS Brain: Ventricles and sulci are prominent compatible with atrophy. Periventricular and subcortical white matter hypodensities compatible with chronic microvascular ischemic changes. No evidence for acute cortically based infarct, intracranial hemorrhage, mass lesion or mass-effect. Left basal ganglia chronic lacunar infarcts. Vascular: No hyperdense vessel or unexpected calcification. Skull: Normal. Negative for fracture or focal lesion. Sinuses/Orbits: Paranasal sinuses well aerated. Mastoid air cells are unremarkable. Orbits are unremarkable. Other: Soft tissue swelling overlying the right frontal calvarium. CT CERVICAL SPINE FINDINGS Alignment: Normal. Skull base and vertebrae: No acute fracture. No primary bone lesion or focal pathologic process. Soft tissues and spinal canal: No prevertebral fluid or swelling. No visible canal hematoma. Disc levels: Multilevel degenerative disc and facet disease throughout the visualized cervical spine. No evidence for acute fracture. Upper chest: Negative. Other: Can not exclude possible distal left clavicle fracture on scout image. IMPRESSION: 1. No acute intracranial abnormality. Atrophy and chronic microvascular ischemic changes. 2. Soft tissue injury overlying the right frontal calvarium. 3. No acute cervical spine fracture. Multilevel degenerative disc disease. 4. Can not exclude possible distal left clavicle fracture on scout image. Recommend correlation with either chest radiograph or left clavicle radiograph. Electronically Signed   By: Lovey Newcomer M.D.   On: 07/27/2022 16:13   CT CERVICAL SPINE WO CONTRAST  Result Date: 07/27/2022 CLINICAL DATA:  Trauma. EXAM: CT HEAD WITHOUT CONTRAST CT CERVICAL SPINE WITHOUT CONTRAST TECHNIQUE: Multidetector CT imaging of the head and cervical spine was performed following the standard protocol without intravenous contrast. Multiplanar CT image reconstructions of the cervical spine were also generated.  RADIATION DOSE REDUCTION: This exam was performed according to the departmental dose-optimization program which includes automated exposure control, adjustment of the mA and/or kV according to patient size and/or use of iterative reconstruction technique. COMPARISON:  Brain CT 05/01/2019 FINDINGS: CT HEAD FINDINGS Brain: Ventricles and sulci are prominent compatible with atrophy. Periventricular and subcortical white matter hypodensities compatible with chronic microvascular ischemic changes. No evidence for acute cortically based infarct, intracranial hemorrhage, mass lesion or mass-effect. Left basal ganglia chronic lacunar infarcts. Vascular: No hyperdense vessel or unexpected calcification. Skull: Normal. Negative for fracture or focal lesion. Sinuses/Orbits: Paranasal sinuses well aerated. Mastoid  air cells are unremarkable. Orbits are unremarkable. Other: Soft tissue swelling overlying the right frontal calvarium. CT CERVICAL SPINE FINDINGS Alignment: Normal. Skull base and vertebrae: No acute fracture. No primary bone lesion or focal pathologic process. Soft tissues and spinal canal: No prevertebral fluid or swelling. No visible canal hematoma. Disc levels: Multilevel degenerative disc and facet disease throughout the visualized cervical spine. No evidence for acute fracture. Upper chest: Negative. Other: Can not exclude possible distal left clavicle fracture on scout image. IMPRESSION: 1. No acute intracranial abnormality. Atrophy and chronic microvascular ischemic changes. 2. Soft tissue injury overlying the right frontal calvarium. 3. No acute cervical spine fracture. Multilevel degenerative disc disease. 4. Can not exclude possible distal left clavicle fracture on scout image. Recommend correlation with either chest radiograph or left clavicle radiograph. Electronically Signed   By: Lovey Newcomer M.D.   On: 07/27/2022 16:13   DG Pelvis Portable  Result Date: 07/27/2022 CLINICAL DATA:  Trauma EXAM:  PORTABLE PELVIS 1-2 VIEWS COMPARISON:  None Available. FINDINGS: There is no evidence of displaced pelvic fracture or diastasis. No pelvic bone lesions are seen. Nonobstructive pattern of overlying bowel gas. IMPRESSION: No displaced pelvic fracture or diastasis. Please note that plain radiographs are significantly insensitive for hip and pelvic fracture. Consider CT to more sensitively evaluate if there is high clinical suspicion for fracture. Electronically Signed   By: Delanna Ahmadi M.D.   On: 07/27/2022 15:31   DG Chest Port 1 View  Result Date: 07/27/2022 CLINICAL DATA:  Trauma, fall EXAM: PORTABLE CHEST 1 VIEW COMPARISON:  12/12/2021 FINDINGS: Cardiomegaly. Both lungs are clear. The visualized skeletal structures are unremarkable. IMPRESSION: Cardiomegaly without acute abnormality of the lungs in AP portable projection. Electronically Signed   By: Delanna Ahmadi M.D.   On: 07/27/2022 15:26    Procedures .Marland KitchenLaceration Repair  Date/Time: 07/27/2022 6:37 PM  Performed by: Regan Lemming, MD Authorized by: Regan Lemming, MD   Consent:    Consent obtained:  Verbal   Consent given by:  Spouse   Risks discussed:  Infection and pain Universal protocol:    Patient identity confirmed:  Arm band Anesthesia:    Anesthesia method:  Local infiltration   Local anesthetic:  Lidocaine 1% WITH epi Laceration details:    Location:  Face   Face location:  Forehead   Length (cm):  1   Depth (mm):  2 Treatment:    Area cleansed with:  Saline   Amount of cleaning:  Standard Skin repair:    Repair method:  Sutures   Suture size:  3-0   Suture material:  Plain gut   Suture technique:  Simple interrupted   Number of sutures:  2 Approximation:    Approximation:  Close Repair type:    Repair type:  Simple Post-procedure details:    Dressing:  Open (no dressing)   Procedure completion:  Tolerated     Medications Ordered in ED Medications  fentaNYL (SUBLIMAZE) injection 25 mcg (25 mcg  Intravenous Given 07/27/22 1524)  lidocaine-EPINEPHrine (XYLOCAINE W/EPI) 2 %-1:200000 (PF) injection 10 mL (10 mLs Infiltration Given 07/27/22 1733)    ED Course/ Medical Decision Making/ A&P                             Medical Decision Making Amount and/or Complexity of Data Reviewed Labs: ordered. Radiology: ordered.  Risk Prescription drug management.    89 year old male with medical history significant for dementia, diverticulosis, atrial fibrillation  on Coumadin who presents to the emergency department from his [facility] home due to a fall.  The patient was walking out of the dining room and felt unsteady on his feet and subsequently fell onto his right side from standing.  He sustained a laceration to his right forehead.  He is on warfarin.  He denies any loss of consciousness.  He also sustained an abrasion to the right posterior shoulder.  He endorses right shoulder pain and right knee pain.  He arrived to the emerged part GCS 14, ABC intact.  On arrival, the patient was vitally stable, physical exam significant for a right forehead laceration and abrasion to the right shoulder with tenderness about the right shoulder and right knee.  Currently, he is awake, alert, and protecting his own airway and is hemodynamically stable.  Trauma imaging revealed (full reports in EMR): Portable CXR:  No evidence of pneumothorax or tracheal deviation Portable Pelvis:  No evidence of acute hip fracture or malalignment CT scans :  CT Head and Cervical Spine: IMPRESSION:  1. No acute intracranial abnormality. Atrophy and chronic  microvascular ischemic changes.  2. Soft tissue injury overlying the right frontal calvarium.  3. No acute cervical spine fracture. Multilevel degenerative disc  disease.  4. Can not exclude possible distal left clavicle fracture on scout  image. Recommend correlation with either chest radiograph or left  clavicle radiograph.    XR Right Shoulder:   IMPRESSION:  1. No evidence of fracture or dislocation.  2. Moderate degenerative changes.    XR Right knee: IMPRESSION:  1. No acute fracture.  2. Mild osteoarthritic change. Small joint effusion.   XR Left Clavicle: IMPRESSION:  No evidence for clavicle fracture. Degenerative changes.   Labs: INR therapeutic at 2.8, CBC with a leukopenia to 2.5, mild anemia to 11.3, CMP without significant electrolyte abnormality, serum creatinine at 1.54 downtrending from 1.7, COVID-19, influenza, RSV PCR negative, lactic acid normal.  The patient's laceration was repaired per the procedure note above.  He is overall at his baseline mental status, no acute neurologic deficits noted on exam.  Overall wife updated regarding patient's workup and plan of care, stable for discharge back to his facility.   Final Clinical Impression(s) / ED Diagnoses Final diagnoses:  Fall, initial encounter  Laceration of scalp, initial encounter    Rx / DC Orders ED Discharge Orders     None         Regan Lemming, MD 07/27/22 Bosie Helper

## 2022-07-27 NOTE — Discharge Instructions (Addendum)
Your trauma imaging was negative for acute bleeding in your brain or acute fracture or other evidence of traumatic injury.  He did displaying a scalp/forehead laceration which was repaired with absorbable sutures.  These will fall out and be absorbed into the skin over time.  Watch for signs of developing infection to include redness, worsening pain, fever or chills, purulent drainage

## 2022-07-27 NOTE — Progress Notes (Signed)
   07/27/22 1500  Spiritual Encounters  Type of Visit Initial  Care provided to: Patient  Referral source Nurse (RN/NT/LPN)  Reason for visit Trauma  OnCall Visit Yes  Interventions  Spiritual Care Interventions Made Compassionate presence  Spiritual Care Plan  Spiritual Care Issues Still Outstanding No further spiritual care needs at this time (see row info)   [place] responded to code level 2-fall on thinner. There was no family present. [place] provided compassionate presence. No follow up needed at this time.

## 2022-07-27 NOTE — ED Triage Notes (Signed)
Pt [facility] from [facility] due to falling.  Pt was walking out of dining room and felt unsteady on feet and fell on his right side from standing.  Pt reports pain to right side of head, right shoulder and right knee.  Pt reports no LOC.  Takes Warfarin.  Pt does have hx of vascular dementia.  VS BP 146/82, Pulse 58, Resp 18, SpO2 98%

## 2022-07-27 NOTE — Progress Notes (Signed)
Orthopedic Tech Progress Note Patient Details:  Peter Singh 03/03/1932 [id number]  Level 2 trauma, ortho tech services not needed at this time.  Patient ID: Peter Singh, male   DOB: May 10, 1932, 89 y.o.   MRN: [id number]  Carin Primrose 07/27/2022, 4:28 PM

## 2022-07-30 ENCOUNTER — Non-Acute Institutional Stay (SKILLED_NURSING_FACILITY): Payer: Medicare Other | Admitting: Orthopedic Surgery

## 2022-07-30 ENCOUNTER — Encounter: Payer: Self-pay | Admitting: Orthopedic Surgery

## 2022-07-30 DIAGNOSIS — S0181XD Laceration without foreign body of other part of head, subsequent encounter: Secondary | ICD-10-CM

## 2022-07-30 DIAGNOSIS — W19XXXD Unspecified fall, subsequent encounter: Secondary | ICD-10-CM

## 2022-07-30 DIAGNOSIS — M25511 Pain in right shoulder: Secondary | ICD-10-CM

## 2022-07-30 DIAGNOSIS — M25561 Pain in right knee: Secondary | ICD-10-CM | POA: Diagnosis not present

## 2022-07-30 NOTE — Progress Notes (Signed)
Location:  [facility] Room Number: 142 Place of Service:  SNF [phone]) Provider:  Yvonna Alanis, NP   [name], MD  [name], [name] [name], MD as PCP - Cardiology (Cardiology)  Extended Emergency Contact Information Primary Emergency Contact: Pilch,[name] Address: [address]          [place], [place] [address] [name] Phone: [phone] Mobile Phone: [phone] Relation: Spouse Secondary Emergency Contact: [name],[name] Address: [address]          [place], [place] [address] [place] Mobile Phone: [phone] Relation: Daughter  Code Status:  DNR Goals of care: Advanced Directive information    07/30/2022    2:32 PM  Advanced Directives  Does Patient Have a Medical Advance Directive? Yes  Type of Advance Directive Out of facility DNR (pink MOST or yellow form);Living will  Does patient want to make changes to medical advance directive? No - Patient declined  Pre-existing out of facility DNR order (yellow form or pink MOST form) Pink MOST form placed in chart (order not valid for inpatient use);Yellow form placed in chart (order not valid for inpatient use)     Chief Complaint  Patient presents with   Acute Visit    Right Knee Pain    HPI:  Pt is a 89 y.o. male seen today for acute visit due to right knee pain.   01/21 mechanical fall on right side with head injury, he was sent to ED for evaluation. Currently uses warfarin for afib. No loss of consciousness. He had increased right knee and shoulder pain. CT head no acute abnormalities, small soft tissue injury to right frontal calvarium. CT spine unremarkable. Xray right shoulder/clavicle negative for fracture or dislocation, degenerative changes present. Xray right knee no acute fracture, small joint effusion noted. INR was therapeutic, other lab unremarkable.  Respiratory panel negative.   Today, he continues to have increased right shoulder and knee pain. Poor historian due to dementia. He grimaces when I lightly touch both extremities. He is taking tylenol 1000 mg BID without pain relief. Non ambulatory at this time per nursing. Using wheelchair now.    Past Medical History:  Diagnosis Date   Adult failure to thrive    Per incoming records from [facility]   Allergic rhinitis    Per incoming records from [facility]   Atrial fibrillation [facility])    BPH (benign prostatic hyperplasia)    Per incoming records from [place] [facility])    Right, Per incoming records from [place]    Per incoming records from [facility]   Cerebrovascular accident, late effects    Per incoming records from [place]   Chronic kidney disease, stage III (moderate) ([place])    Per incoming records from [facility])    Per incoming records from [facility]   Cognitive impairment    Mild, Per incoming records from [facility]   Constipation    Per incoming records from [facility]   Dementia [facility])    Diarrhea    Better off Aricept, Per incoming records from [facility]   Diastolic dysfunction    on 2D Echo 07/2009 and 2016, Per incoming records from [facility]   Diverticulosis    Mild,  Per incoming records from [facility]   Diverticulosis of colon (without mention of hemorrhage)    ED (erectile dysfunction)    Per incoming records from [facility]   History of Coumadin therapy    Per incoming records from [facility]   History of echocardiogram 09/2014   Per incoming records from [facility]   Hyperlipemia    Hypertension    Kidney mass    Per incoming  records from [place] leg edema    Per incoming records from [facility])    Neuropathy    Per incoming records from [facility]   OA (osteoarthritis)    Per incoming records from [facility]   Peripheral neuropathy    Per incoming records from [place] history of colonic [place] & 2004   adenomatous polyps   Pulmonary arterial hypertension ([place])    Per incoming records from [place]    Per incoming records from [facility]   Thrombocytopenia [facility]) 2017   Per incoming records from [facility]   UTI (urinary tract infection)    Sepsis, Per incoming records from [place] hypertrophy    Gilford Rile as ambulation aid    Per incoming records from [facility]   Past Surgical History:  Procedure Laterality Date   APPENDECTOMY     Per incoming records from [place]     Per incoming records from [facility]   IR [place]  11/21/2019   KNEE SURGERY     right   PILONIDAL CYST DRAINAGE     POLYPECTOMY     Per incoming records from [place] tumor lumbar spine     SPINE SURGERY     tumor removed   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     Outpatient Encounter Medications as of 07/30/2022  Medication Sig   acetaminophen (TYLENOL) 500 MG tablet Take 1,000 mg by mouth 2 (two) times daily.   cholecalciferol (VITAMIN D3) 25 MCG (1000 UT) tablet Take 2,000 Units by mouth daily.    Cranberry 400 MG CAPS Take 400 mg by mouth every morning. 400 mg, oral, Once a morning   DULoxetine (CYMBALTA) 20 MG capsule Take 20 mg by mouth daily.   furosemide (LASIX) 20 MG tablet Take 2 tablets (40 mg total) by mouth daily. Take extra '20mg'$  for weight gain of 2-3lbs in 1 day or 5 lbs in 1 week   gabapentin (NEURONTIN) 100 MG capsule Take 200 mg by mouth at bedtime.   ketoconazole (NIZORAL) 2 % shampoo Apply 1 Application topically 2 (two) times a week.   polyethylene glycol (MIRALAX / GLYCOLAX) 17 g packet  Take 17 g by mouth daily.   potassium chloride (MICRO-K) 10 MEQ CR capsule Take 10 mEq by mouth every Tuesday, Thursday, and Saturday at 6 PM. Every Tues., Thurs., Sat. 7:00 am - 3:00 pm, per [facility].   triamcinolone lotion (KENALOG) 0.1 % Apply 1 application. topically 2 (two) times daily as needed (rash on back).   warfarin (COUMADIN) 4 MG tablet Take 4 mg by mouth daily. Once a day on Mon, Wed, Fri, Sat 4:00 pm - 7:00 pm   warfarin (COUMADIN) 5 MG tablet Take 5 mg by mouth daily. Give PO every Sun, Tues, Thurs 4:00 pm - 7:00 pm   No facility-administered encounter medications on file as of 07/30/2022.    Review of Systems  Unable to perform ROS: Dementia    Immunization History  Administered Date(s) Administered   DTaP 01/20/2022   Influenza, High Dose Seasonal PF 05/04/2020, 04/10/2021   Influenza-Unspecified 04/17/2014, 05/01/2016, 04/15/2019, 04/11/2022   Moderna Covid-19 Vaccine Bivalent Booster 51yr & up 04/17/2021   Moderna SARS-COV2 Booster Vaccination 05/17/2020, 05/12/2022   Moderna Sars-Covid-2 Vaccination  07/12/2019, 08/09/2019, 11/03/2021   Pneumococcal Conjugate-13 04/20/2014   Pneumococcal Polysaccharide-23 07/08/2003, 03/31/2011   Pneumococcal-Unspecified 01/26/2004   Td 08/23/2007   Tdap 01/07/2012, 01/20/2022   Zoster Recombinat (Shingrix) 10/05/2017, 12/09/2017   Zoster, Live 07/17/2005   Pertinent  Health Maintenance Due  Topic Date Due   INFLUENZA VACCINE  Completed      12/14/2021    8:00 AM 12/15/2021    9:00 AM 12/16/2021   10:00 AM 05/27/2022    1:32 PM 07/30/2022    2:31 PM  Fall Risk  Falls in the past year?    0 0  Was there an injury with Fall?    0 0  Fall Risk Category Calculator    0 0  Fall Risk Category (Retired)    Low   (RETIRED) Patient Fall Risk Level High fall risk High fall risk High fall risk Low fall risk   Patient at Risk for Falls Due to     History of fall(s)  Fall risk Follow up     Falls evaluation completed   Functional Status Survey:    Vitals:   07/30/22 1425  BP: (!) 154/66  Pulse: 82  Resp: 18  Temp: (!) 97.5 F (36.4 C)  SpO2: 91%  Weight: 261 lb (118.4 kg)  Height: '6\' 7"'$  (2.007 m)   Body mass index is 29.4 kg/m. Physical Exam Vitals reviewed.  Constitutional:      General: He is not in acute distress. HENT:     Head: Normocephalic.     Comments: Approx 1 cm laceration to right forehead, CDI Eyes:     General:        Right eye: No discharge.        Left eye: No discharge.  Cardiovascular:     Rate and Rhythm: Normal rate. Rhythm irregular.     Pulses: Normal pulses.     Heart sounds: Normal heart sounds.  Pulmonary:     Effort: Pulmonary effort is normal. No respiratory distress.     Breath sounds: Normal breath sounds. No wheezing.  Abdominal:     General: Bowel sounds are normal. There is no distension.     Palpations: Abdomen is soft.     Tenderness: There is no abdominal tenderness.  Musculoskeletal:        General: Swelling and deformity present.     Right shoulder: Swelling and tenderness present.  No  crepitus. Normal range of motion. Normal strength.     Cervical back: Neck supple.     Right knee: Effusion and crepitus present. Decreased range of motion. Tenderness present over the MCL.  Skin:    General: Skin is warm and dry.     Capillary Refill: Capillary refill takes less than 2 seconds.     Findings: Erythema present.     Comments: Right upper shoulder/arm weeping, tender to touch, no erythema, small skin tear present, CDI  Neurological:     General: No focal deficit present.     Mental Status: He is alert. Mental status is at baseline.     Motor: Weakness present.     Gait: Gait abnormal.     Comments: wheelchair  Psychiatric:        Mood and Affect: Mood normal.     Labs reviewed: Recent Labs    12/15/21 0035 12/16/21 0146 12/20/21 0000 04/24/22 0000 05/05/22 0000 06/09/22 0815 07/27/22 1503  NA 142 142   < > 143 143 142 140  K 3.5 3.4*   < > 5.0 4.2 4.2 4.5  CL 105 105   < > 105 106 108 107  CO2 26 27   < > 26* 27* 26* 26  GLUCOSE 129* 122*  --   --   --   --  107*  BUN 57* 55*   < > 25* 27* 26* 26*  CREATININE 1.72* 1.48*   < > 1.4* 1.7* 1.5* 1.54*  CALCIUM 10.4* 10.7*   < > 10.6  --  10.0 10.4*   < > = values in this interval not displayed.   Recent Labs    12/08/21 1904 12/11/21 2010 07/27/22 1503  AST 14* 16 42*  ALT '8 8 8  '$ ALKPHOS 101 82 100  BILITOT 1.6* 1.9* 0.9  PROT 6.4* 6.2* 6.7  ALBUMIN 3.6 3.4* 3.8   Recent Labs    12/11/21 2010 12/12/21 0650 12/15/21 0035 12/16/21 0146 02/13/22 0000 05/05/22 0000 05/23/22 1657 06/09/22 0815 07/27/22 1503  WBC 5.1   < > 4.0 3.4*   < > 2.1 2.3 1.9 2.5*  NEUTROABS 4.1  --   --   --   --  1.00 1.30  --   --   HGB 13.2   < > 12.7* 13.2   < > 11.9* 10.5* 11.5* 11.3*  HCT 43.1   < > 40.5 40.3   < > 37* 33* 35* 38.9*  MCV 90.5   < > 86.9 86.1  --   --   --   --  94.2  PLT 88*   < > 95* 105*   < > 77* 18* 59* 74*   < > = values in this interval not displayed.   Lab Results  Component Value Date    TSH 0.84 05/19/2019   Lab Results  Component Value Date   HGBA1C 6 08/22/2019   Lab Results  Component Value Date   CHOL 129 08/09/2020   HDL 32 (A) 08/09/2020   LDLCALC 80 08/09/2020   TRIG 86 08/09/2020    Significant Diagnostic Results in last 30 days:  DG Clavicle Left  Result Date: 07/27/2022 CLINICAL DATA:  Evaluate for possible clavicle fracture. EXAM: LEFT CLAVICLE - 2+ VIEWS COMPARISON:  CT head and cervical spine earlier same day FINDINGS: No evidence for clavicle fracture. AC joint and glenohumeral joint degenerative changes. Visualized left hemithorax is unremarkable. IMPRESSION: No evidence for clavicle fracture. Degenerative changes.  Electronically Signed   By: Lovey Newcomer M.D.   On: 07/27/2022 17:49   DG Shoulder Right Port  Result Date: 07/27/2022 CLINICAL DATA:  Trauma EXAM: RIGHT SHOULDER - 1 VIEW COMPARISON:  None Available. FINDINGS: There is no evidence of fracture or dislocation. There is moderate acromioclavicular and glenohumeral joint space narrowing and osteophyte formation compatible with degenerative change. Soft tissues are unremarkable. IMPRESSION: 1. No evidence of fracture or dislocation. 2. Moderate degenerative changes. Electronically Signed   By: Ronney Asters M.D.   On: 07/27/2022 17:03   DG Knee Right Port  Result Date: 07/27/2022 CLINICAL DATA:  Fall with right knee pain. EXAM: PORTABLE RIGHT KNEE - 1-2 VIEW COMPARISON:  None Available. FINDINGS: Mild tricompartmental osteoarthritic change. Chondrocalcinosis over the lateral compartment. Suggestion of a small joint effusion. No evidence of acute fracture or dislocation. Remainder of the exam is unremarkable. IMPRESSION: 1. No acute fracture. 2. Mild osteoarthritic change. Small joint effusion. Electronically Signed   By: Marin Olp M.D.   On: 07/27/2022 16:25   CT HEAD WO CONTRAST  Result Date: 07/27/2022 CLINICAL DATA:  Trauma. EXAM: CT HEAD WITHOUT CONTRAST CT CERVICAL SPINE WITHOUT CONTRAST  TECHNIQUE: Multidetector CT imaging of the head and cervical spine was performed following the standard protocol without intravenous contrast. Multiplanar CT image reconstructions of the cervical spine were also generated. RADIATION DOSE REDUCTION: This exam was performed according to the departmental dose-optimization program which includes automated exposure control, adjustment of the mA and/or kV according to patient size and/or use of iterative reconstruction technique. COMPARISON:  Brain CT 05/01/2019 FINDINGS: CT HEAD FINDINGS Brain: Ventricles and sulci are prominent compatible with atrophy. Periventricular and subcortical white matter hypodensities compatible with chronic microvascular ischemic changes. No evidence for acute cortically based infarct, intracranial hemorrhage, mass lesion or mass-effect. Left basal ganglia chronic lacunar infarcts. Vascular: No hyperdense vessel or unexpected calcification. Skull: Normal. Negative for fracture or focal lesion. Sinuses/Orbits: Paranasal sinuses well aerated. Mastoid air cells are unremarkable. Orbits are unremarkable. Other: Soft tissue swelling overlying the right frontal calvarium. CT CERVICAL SPINE FINDINGS Alignment: Normal. Skull base and vertebrae: No acute fracture. No primary bone lesion or focal pathologic process. Soft tissues and spinal canal: No prevertebral fluid or swelling. No visible canal hematoma. Disc levels: Multilevel degenerative disc and facet disease throughout the visualized cervical spine. No evidence for acute fracture. Upper chest: Negative. Other: Can not exclude possible distal left clavicle fracture on scout image. IMPRESSION: 1. No acute intracranial abnormality. Atrophy and chronic microvascular ischemic changes. 2. Soft tissue injury overlying the right frontal calvarium. 3. No acute cervical spine fracture. Multilevel degenerative disc disease. 4. Can not exclude possible distal left clavicle fracture on scout image. Recommend  correlation with either chest radiograph or left clavicle radiograph. Electronically Signed   By: Lovey Newcomer M.D.   On: 07/27/2022 16:13   CT CERVICAL SPINE WO CONTRAST  Result Date: 07/27/2022 CLINICAL DATA:  Trauma. EXAM: CT HEAD WITHOUT CONTRAST CT CERVICAL SPINE WITHOUT CONTRAST TECHNIQUE: Multidetector CT imaging of the head and cervical spine was performed following the standard protocol without intravenous contrast. Multiplanar CT image reconstructions of the cervical spine were also generated. RADIATION DOSE REDUCTION: This exam was performed according to the departmental dose-optimization program which includes automated exposure control, adjustment of the mA and/or kV according to patient size and/or use of iterative reconstruction technique. COMPARISON:  Brain CT 05/01/2019 FINDINGS: CT HEAD FINDINGS Brain: Ventricles and sulci are prominent compatible with atrophy. Periventricular and subcortical white matter  hypodensities compatible with chronic microvascular ischemic changes. No evidence for acute cortically based infarct, intracranial hemorrhage, mass lesion or mass-effect. Left basal ganglia chronic lacunar infarcts. Vascular: No hyperdense vessel or unexpected calcification. Skull: Normal. Negative for fracture or focal lesion. Sinuses/Orbits: Paranasal sinuses well aerated. Mastoid air cells are unremarkable. Orbits are unremarkable. Other: Soft tissue swelling overlying the right frontal calvarium. CT CERVICAL SPINE FINDINGS Alignment: Normal. Skull base and vertebrae: No acute fracture. No primary bone lesion or focal pathologic process. Soft tissues and spinal canal: No prevertebral fluid or swelling. No visible canal hematoma. Disc levels: Multilevel degenerative disc and facet disease throughout the visualized cervical spine. No evidence for acute fracture. Upper chest: Negative. Other: Can not exclude possible distal left clavicle fracture on scout image. IMPRESSION: 1. No acute  intracranial abnormality. Atrophy and chronic microvascular ischemic changes. 2. Soft tissue injury overlying the right frontal calvarium. 3. No acute cervical spine fracture. Multilevel degenerative disc disease. 4. Can not exclude possible distal left clavicle fracture on scout image. Recommend correlation with either chest radiograph or left clavicle radiograph. Electronically Signed   By: Lovey Newcomer M.D.   On: 07/27/2022 16:13   DG Pelvis Portable  Result Date: 07/27/2022 CLINICAL DATA:  Trauma EXAM: PORTABLE PELVIS 1-2 VIEWS COMPARISON:  None Available. FINDINGS: There is no evidence of displaced pelvic fracture or diastasis. No pelvic bone lesions are seen. Nonobstructive pattern of overlying bowel gas. IMPRESSION: No displaced pelvic fracture or diastasis. Please note that plain radiographs are significantly insensitive for hip and pelvic fracture. Consider CT to more sensitively evaluate if there is high clinical suspicion for fracture. Electronically Signed   By: Delanna Ahmadi M.D.   On: 07/27/2022 15:31   DG Chest Port 1 View  Result Date: 07/27/2022 CLINICAL DATA:  Trauma, fall EXAM: PORTABLE CHEST 1 VIEW COMPARISON:  12/12/2021 FINDINGS: Cardiomegaly. Both lungs are clear. The visualized skeletal structures are unremarkable. IMPRESSION: Cardiomegaly without acute abnormality of the lungs in AP portable projection. Electronically Signed   By: Delanna Ahmadi M.D.   On: 07/27/2022 15:26    Assessment/Plan 1. Acute pain of right knee - 01/21 mechanical fall  - xray negative for fracture and dislocation, noted joint effusion - right knee swollen and warm, MCL tenderness, unable to ambulate - start prednisone 20 mg po QAM x 5 days - increase gabapentin to 200 mg po BID to help with pain - cont scheduled tylenol  - Ice to right knee TID x 5 days  2. Acute pain of right shoulder - see above - right shoulder swollen and weeping - Ice to right shoulder TID x 5 days - see above  3.  Laceration of other part of head without foreign body, subsequent encounter - 01/21 mechanical fall - CT head unremarkable  4. Fall, subsequent encounter - 01/21 mechanical fall  - lives in SNF - poor safety awareness due to dementia - cont falls precautions    Family/ staff Communication: plan discussed with patient and nurse  Labs/tests ordered:  none

## 2022-07-30 NOTE — Progress Notes (Deleted)
Location:  [place] Room Number: 142 Place of Service:  SNF [phone]) Provider:  Windell Moulding, NP   [name], [name] [name], MD as PCP - Cardiology (Cardiology)  Extended Emergency Contact Information Primary Emergency Contact: Ciani,[name] Address: [address]          [place], [place] [address] [name] of [place] Phone: [phone] Mobile Phone: [phone] Relation: Spouse Secondary Emergency Contact: [name],[name] Address: [address]          [place], [place] [address] [place] Mobile Phone: [phone] Relation: Daughter  Code Status:  DNR  Goals of care: Advanced Directive information    07/30/2022    2:32 PM  Advanced Directives  Does Patient Have a Medical Advance Directive? Yes  Type of Advance Directive Out of facility DNR (pink MOST or yellow form);Living will  Does patient want to make changes to medical advance directive? No - Patient declined  Pre-existing out of facility DNR order (yellow form or pink MOST form) Pink MOST form placed in chart (order not valid for inpatient use);Yellow form placed in chart (order not valid for inpatient use)     Chief Complaint  Patient presents with   Acute Visit    Right Knee Pain    HPI:  Pt is a 89 y.o. male seen today for an acute visit for    Past Medical History:  Diagnosis Date   Adult failure to thrive    Per incoming records from [place]   Allergic rhinitis    Per incoming records from [facility]   Atrial fibrillation [facility])    BPH (benign prostatic hyperplasia)    Per incoming records from [place] [facility])    Right, Per incoming records from [place]    Per incoming records from [facility]   Cerebrovascular accident, late effects    Per incoming  records from [place]   Chronic kidney disease, stage III (moderate) ([place])    Per incoming records from [facility])    Per incoming records from [facility]   Cognitive impairment    Mild, Per incoming records from [facility]   Constipation    Per incoming records from [facility]   Dementia [facility])    Diarrhea    Better off Aricept, Per incoming records from [facility]   Diastolic dysfunction    on 2D Echo 07/2009 and 2016, Per incoming records from [facility]   Diverticulosis    Mild, Per incoming records from [facility]   Diverticulosis of colon (without mention of hemorrhage)    ED (erectile dysfunction)    Per incoming records from [facility]   History of Coumadin therapy    Per incoming records from [facility]   History of echocardiogram 09/2014   Per incoming records from [facility]   Hyperlipemia    Hypertension    Kidney mass    Per incoming records from [place] leg edema    Per incoming records from [facility])    Neuropathy    Per incoming records from [facility]   OA (osteoarthritis)    Per incoming records from [facility]   Peripheral  neuropathy    Per incoming records from [place] history of colonic polyps 1999 & 2004   adenomatous polyps   Pulmonary arterial hypertension ([place])    Per incoming records from [place]    Per incoming records from [facility]   Thrombocytopenia [facility]) 2017   Per incoming records from [facility]   UTI (urinary tract infection)    Sepsis, Per incoming records from [place] hypertrophy    Gilford Rile as ambulation aid     Per incoming records from [facility]   Past Surgical History:  Procedure Laterality Date   APPENDECTOMY     Per incoming records from [place]     Per incoming records from [place]  11/21/2019   KNEE SURGERY     right   PILONIDAL CYST DRAINAGE     POLYPECTOMY     Per incoming records from [place] tumor lumbar spine     SPINE SURGERY     tumor removed   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     Outpatient Encounter Medications as of 07/30/2022  Medication Sig   acetaminophen (TYLENOL) 500 MG tablet Take 1,000 mg by mouth 2 (two) times daily.   cholecalciferol (VITAMIN D3) 25 MCG (1000 UT) tablet Take 2,000 Units by mouth daily.    Cranberry 400 MG CAPS Take 400 mg by mouth every morning. 400 mg, oral, Once a morning   DULoxetine (CYMBALTA) 20 MG capsule Take 20 mg by mouth daily.   furosemide (LASIX) 20 MG tablet Take 2 tablets (40 mg total) by mouth daily. Take extra '20mg'$  for weight gain of 2-3lbs in 1 day or 5 lbs in 1 week   gabapentin (NEURONTIN) 100 MG capsule Take 200 mg by mouth at bedtime.   ketoconazole (NIZORAL) 2 % shampoo Apply 1 Application topically 2 (two) times a week.   polyethylene glycol (MIRALAX /  GLYCOLAX) 17 g packet Take 17 g by mouth daily.   potassium chloride (MICRO-K) 10 MEQ CR capsule Take 10 mEq by mouth every Tuesday, Thursday, and Saturday at 6 PM. Every Tues., Thurs., Sat. 7:00 am - 3:00 pm, per [facility].   triamcinolone lotion (KENALOG) 0.1 % Apply 1 application. topically 2 (two) times daily as needed (rash on back).   warfarin (COUMADIN) 4 MG tablet Take 4 mg by mouth daily. Once a day on Mon, Wed, Fri, Sat 4:00 pm - 7:00 pm   warfarin (COUMADIN) 5 MG tablet Take 5 mg by mouth daily. Give PO every Sun, Tues, Thurs 4:00 pm - 7:00 pm   No facility-administered encounter medications on file as of 07/30/2022.    Review of Systems  Immunization History  Administered Date(s) Administered  DTaP 01/20/2022   Influenza, High Dose Seasonal PF 05/04/2020, 04/10/2021   Influenza-Unspecified 04/17/2014, 05/01/2016, 04/15/2019, 04/11/2022   Moderna Covid-19 Vaccine Bivalent Booster 7yr & up 04/17/2021   Moderna SARS-COV2 Booster Vaccination 05/17/2020, 05/12/2022   Moderna Sars-Covid-2 Vaccination 07/12/2019, 08/09/2019, 11/03/2021   Pneumococcal Conjugate-13 04/20/2014   Pneumococcal Polysaccharide-23 07/08/2003, 03/31/2011   Pneumococcal-Unspecified 01/26/2004   Td 08/23/2007   Tdap 01/07/2012, 01/20/2022   Zoster Recombinat (Shingrix) 10/05/2017, 12/09/2017   Zoster, Live 07/17/2005   Pertinent  Health Maintenance Due  Topic Date Due   INFLUENZA VACCINE  Completed      12/14/2021    8:00 AM 12/15/2021    9:00 AM 12/16/2021   10:00 AM 05/27/2022    1:32 PM 07/30/2022    2:31 PM  Fall Risk  Falls in the past year?    0 0  Was there an injury with Fall?    0 0  Fall Risk Category Calculator    0 0  Fall Risk Category (Retired)    Low   (RETIRED) Patient Fall Risk Level High fall risk High fall risk High fall risk Low fall risk   Patient at Risk for Falls Due to     History of fall(s)  Fall risk Follow up     Falls evaluation completed   Functional  Status Survey:    Vitals:   07/30/22 1425  BP: (!) 154/66  Pulse: 82  Resp: 18  Temp: (!) 97.5 F (36.4 C)  SpO2: 91%  Weight: 261 lb (118.4 kg)  Height: '6\' 7"'$  (2.007 m)   Body mass index is 29.4 kg/m. Physical Exam  Labs reviewed: Recent Labs    12/15/21 0035 12/16/21 0146 12/20/21 0000 04/24/22 0000 05/05/22 0000 06/09/22 0815 07/27/22 1503  NA 142 142   < > 143 143 142 140  K 3.5 3.4*   < > 5.0 4.2 4.2 4.5  CL 105 105   < > 105 106 108 107  CO2 26 27   < > 26* 27* 26* 26  GLUCOSE 129* 122*  --   --   --   --  107*  BUN 57* 55*   < > 25* 27* 26* 26*  CREATININE 1.72* 1.48*   < > 1.4* 1.7* 1.5* 1.54*  CALCIUM 10.4* 10.7*   < > 10.6  --  10.0 10.4*   < > = values in this interval not displayed.   Recent Labs    12/08/21 1904 12/11/21 2010 07/27/22 1503  AST 14* 16 42*  ALT '8 8 8  '$ ALKPHOS 101 82 100  BILITOT 1.6* 1.9* 0.9  PROT 6.4* 6.2* 6.7  ALBUMIN 3.6 3.4* 3.8   Recent Labs    12/11/21 2010 12/12/21 0650 12/15/21 0035 12/16/21 0146 02/13/22 0000 05/05/22 0000 05/23/22 1657 06/09/22 0815 07/27/22 1503  WBC 5.1   < > 4.0 3.4*   < > 2.1 2.3 1.9 2.5*  NEUTROABS 4.1  --   --   --   --  1.00 1.30  --   --   HGB 13.2   < > 12.7* 13.2   < > 11.9* 10.5* 11.5* 11.3*  HCT 43.1   < > 40.5 40.3   < > 37* 33* 35* 38.9*  MCV 90.5   < > 86.9 86.1  --   --   --   --  94.2  PLT 88*   < > 95* 105*   < > 77* 18* 59* 74*   < > =  values in this interval not displayed.   Lab Results  Component Value Date   TSH 0.84 05/19/2019   Lab Results  Component Value Date   HGBA1C 6 08/22/2019   Lab Results  Component Value Date   CHOL 129 08/09/2020   HDL 32 (A) 08/09/2020   LDLCALC 80 08/09/2020   TRIG 86 08/09/2020    Significant Diagnostic Results in last 30 days:  DG Clavicle Left  Result Date: 07/27/2022 CLINICAL DATA:  Evaluate for possible clavicle fracture. EXAM: LEFT CLAVICLE - 2+ VIEWS COMPARISON:  CT head and cervical spine earlier same day  FINDINGS: No evidence for clavicle fracture. AC joint and glenohumeral joint degenerative changes. Visualized left hemithorax is unremarkable. IMPRESSION: No evidence for clavicle fracture. Degenerative changes. Electronically Signed   By: Lovey Newcomer M.D.   On: 07/27/2022 17:49   DG Shoulder Right Port  Result Date: 07/27/2022 CLINICAL DATA:  Trauma EXAM: RIGHT SHOULDER - 1 VIEW COMPARISON:  None Available. FINDINGS: There is no evidence of fracture or dislocation. There is moderate acromioclavicular and glenohumeral joint space narrowing and osteophyte formation compatible with degenerative change. Soft tissues are unremarkable. IMPRESSION: 1. No evidence of fracture or dislocation. 2. Moderate degenerative changes. Electronically Signed   By: Ronney Asters M.D.   On: 07/27/2022 17:03   DG Knee Right Port  Result Date: 07/27/2022 CLINICAL DATA:  Fall with right knee pain. EXAM: PORTABLE RIGHT KNEE - 1-2 VIEW COMPARISON:  None Available. FINDINGS: Mild tricompartmental osteoarthritic change. Chondrocalcinosis over the lateral compartment. Suggestion of a small joint effusion. No evidence of acute fracture or dislocation. Remainder of the exam is unremarkable. IMPRESSION: 1. No acute fracture. 2. Mild osteoarthritic change. Small joint effusion. Electronically Signed   By: Marin Olp M.D.   On: 07/27/2022 16:25   CT HEAD WO CONTRAST  Result Date: 07/27/2022 CLINICAL DATA:  Trauma. EXAM: CT HEAD WITHOUT CONTRAST CT CERVICAL SPINE WITHOUT CONTRAST TECHNIQUE: Multidetector CT imaging of the head and cervical spine was performed following the standard protocol without intravenous contrast. Multiplanar CT image reconstructions of the cervical spine were also generated. RADIATION DOSE REDUCTION: This exam was performed according to the departmental dose-optimization program which includes automated exposure control, adjustment of the mA and/or kV according to patient size and/or use of iterative  reconstruction technique. COMPARISON:  Brain CT 05/01/2019 FINDINGS: CT HEAD FINDINGS Brain: Ventricles and sulci are prominent compatible with atrophy. Periventricular and subcortical white matter hypodensities compatible with chronic microvascular ischemic changes. No evidence for acute cortically based infarct, intracranial hemorrhage, mass lesion or mass-effect. Left basal ganglia chronic lacunar infarcts. Vascular: No hyperdense vessel or unexpected calcification. Skull: Normal. Negative for fracture or focal lesion. Sinuses/Orbits: Paranasal sinuses well aerated. Mastoid air cells are unremarkable. Orbits are unremarkable. Other: Soft tissue swelling overlying the right frontal calvarium. CT CERVICAL SPINE FINDINGS Alignment: Normal. Skull base and vertebrae: No acute fracture. No primary bone lesion or focal pathologic process. Soft tissues and spinal canal: No prevertebral fluid or swelling. No visible canal hematoma. Disc levels: Multilevel degenerative disc and facet disease throughout the visualized cervical spine. No evidence for acute fracture. Upper chest: Negative. Other: Can not exclude possible distal left clavicle fracture on scout image. IMPRESSION: 1. No acute intracranial abnormality. Atrophy and chronic microvascular ischemic changes. 2. Soft tissue injury overlying the right frontal calvarium. 3. No acute cervical spine fracture. Multilevel degenerative disc disease. 4. Can not exclude possible distal left clavicle fracture on scout image. Recommend correlation with either chest radiograph or  left clavicle radiograph. Electronically Signed   By: Lovey Newcomer M.D.   On: 07/27/2022 16:13   CT CERVICAL SPINE WO CONTRAST  Result Date: 07/27/2022 CLINICAL DATA:  Trauma. EXAM: CT HEAD WITHOUT CONTRAST CT CERVICAL SPINE WITHOUT CONTRAST TECHNIQUE: Multidetector CT imaging of the head and cervical spine was performed following the standard protocol without intravenous contrast. Multiplanar CT image  reconstructions of the cervical spine were also generated. RADIATION DOSE REDUCTION: This exam was performed according to the departmental dose-optimization program which includes automated exposure control, adjustment of the mA and/or kV according to patient size and/or use of iterative reconstruction technique. COMPARISON:  Brain CT 05/01/2019 FINDINGS: CT HEAD FINDINGS Brain: Ventricles and sulci are prominent compatible with atrophy. Periventricular and subcortical white matter hypodensities compatible with chronic microvascular ischemic changes. No evidence for acute cortically based infarct, intracranial hemorrhage, mass lesion or mass-effect. Left basal ganglia chronic lacunar infarcts. Vascular: No hyperdense vessel or unexpected calcification. Skull: Normal. Negative for fracture or focal lesion. Sinuses/Orbits: Paranasal sinuses well aerated. Mastoid air cells are unremarkable. Orbits are unremarkable. Other: Soft tissue swelling overlying the right frontal calvarium. CT CERVICAL SPINE FINDINGS Alignment: Normal. Skull base and vertebrae: No acute fracture. No primary bone lesion or focal pathologic process. Soft tissues and spinal canal: No prevertebral fluid or swelling. No visible canal hematoma. Disc levels: Multilevel degenerative disc and facet disease throughout the visualized cervical spine. No evidence for acute fracture. Upper chest: Negative. Other: Can not exclude possible distal left clavicle fracture on scout image. IMPRESSION: 1. No acute intracranial abnormality. Atrophy and chronic microvascular ischemic changes. 2. Soft tissue injury overlying the right frontal calvarium. 3. No acute cervical spine fracture. Multilevel degenerative disc disease. 4. Can not exclude possible distal left clavicle fracture on scout image. Recommend correlation with either chest radiograph or left clavicle radiograph. Electronically Signed   By: Lovey Newcomer M.D.   On: 07/27/2022 16:13   DG Pelvis  Portable  Result Date: 07/27/2022 CLINICAL DATA:  Trauma EXAM: PORTABLE PELVIS 1-2 VIEWS COMPARISON:  None Available. FINDINGS: There is no evidence of displaced pelvic fracture or diastasis. No pelvic bone lesions are seen. Nonobstructive pattern of overlying bowel gas. IMPRESSION: No displaced pelvic fracture or diastasis. Please note that plain radiographs are significantly insensitive for hip and pelvic fracture. Consider CT to more sensitively evaluate if there is high clinical suspicion for fracture. Electronically Signed   By: Delanna Ahmadi M.D.   On: 07/27/2022 15:31   DG Chest Port 1 View  Result Date: 07/27/2022 CLINICAL DATA:  Trauma, fall EXAM: PORTABLE CHEST 1 VIEW COMPARISON:  12/12/2021 FINDINGS: Cardiomegaly. Both lungs are clear. The visualized skeletal structures are unremarkable. IMPRESSION: Cardiomegaly without acute abnormality of the lungs in AP portable projection. Electronically Signed   By: Delanna Ahmadi M.D.   On: 07/27/2022 15:26    Assessment/Plan There are no diagnoses linked to this encounter.   Family/ staff Communication: ***  Labs/tests ordered:  ***

## 2022-07-31 ENCOUNTER — Observation Stay (HOSPITAL_COMMUNITY)
Admission: EM | Admit: 2022-07-31 | Discharge: 2022-08-01 | Disposition: A | Discharge status: Skilled Nursing Facility | Payer: Medicare Other | Attending: Internal Medicine | Admitting: Internal Medicine

## 2022-07-31 ENCOUNTER — Emergency Department (HOSPITAL_COMMUNITY): Payer: Medicare Other

## 2022-07-31 DIAGNOSIS — J929 Pleural plaque without asbestos: Secondary | ICD-10-CM | POA: Diagnosis not present

## 2022-07-31 DIAGNOSIS — I712 Thoracic aortic aneurysm, without rupture, unspecified: Secondary | ICD-10-CM | POA: Diagnosis not present

## 2022-07-31 DIAGNOSIS — S066X0A Traumatic subarachnoid hemorrhage without loss of consciousness, initial encounter: Secondary | ICD-10-CM | POA: Diagnosis not present

## 2022-07-31 DIAGNOSIS — S0990XA Unspecified injury of head, initial encounter: Secondary | ICD-10-CM | POA: Diagnosis not present

## 2022-07-31 DIAGNOSIS — W01198A Fall on same level from slipping, tripping and stumbling with subsequent striking against other object, initial encounter: Secondary | ICD-10-CM | POA: Insufficient documentation

## 2022-07-31 DIAGNOSIS — Y9301 Activity, walking, marching and hiking: Secondary | ICD-10-CM | POA: Insufficient documentation

## 2022-07-31 DIAGNOSIS — S32048A Other fracture of fourth lumbar vertebra, initial encounter for closed fracture: Secondary | ICD-10-CM | POA: Diagnosis not present

## 2022-07-31 DIAGNOSIS — M7051 Other bursitis of knee, right knee: Secondary | ICD-10-CM | POA: Diagnosis not present

## 2022-07-31 DIAGNOSIS — N1832 Chronic kidney disease, stage 3b: Secondary | ICD-10-CM | POA: Insufficient documentation

## 2022-07-31 DIAGNOSIS — I739 Peripheral vascular disease, unspecified: Secondary | ICD-10-CM | POA: Diagnosis present

## 2022-07-31 DIAGNOSIS — T07XXXA Unspecified multiple injuries, initial encounter: Secondary | ICD-10-CM | POA: Diagnosis not present

## 2022-07-31 DIAGNOSIS — Z87891 Personal history of nicotine dependence: Secondary | ICD-10-CM | POA: Diagnosis not present

## 2022-07-31 DIAGNOSIS — R410 Disorientation, unspecified: Secondary | ICD-10-CM | POA: Diagnosis present

## 2022-07-31 DIAGNOSIS — R220 Localized swelling, mass and lump, head: Secondary | ICD-10-CM | POA: Diagnosis not present

## 2022-07-31 DIAGNOSIS — Z7901 Long term (current) use of anticoagulants: Secondary | ICD-10-CM

## 2022-07-31 DIAGNOSIS — S3993XA Unspecified injury of pelvis, initial encounter: Secondary | ICD-10-CM | POA: Diagnosis not present

## 2022-07-31 DIAGNOSIS — S3217XA Type 4 fracture of sacrum, initial encounter for closed fracture: Secondary | ICD-10-CM | POA: Insufficient documentation

## 2022-07-31 DIAGNOSIS — R8290 Unspecified abnormal findings in urine: Secondary | ICD-10-CM | POA: Insufficient documentation

## 2022-07-31 DIAGNOSIS — R918 Other nonspecific abnormal finding of lung field: Secondary | ICD-10-CM | POA: Diagnosis not present

## 2022-07-31 DIAGNOSIS — I5032 Chronic diastolic (congestive) heart failure: Secondary | ICD-10-CM | POA: Insufficient documentation

## 2022-07-31 DIAGNOSIS — S0636AA Traumatic hemorrhage of cerebrum, unspecified, with loss of consciousness status unknown, initial encounter: Secondary | ICD-10-CM

## 2022-07-31 DIAGNOSIS — F0150 Vascular dementia without behavioral disturbance: Secondary | ICD-10-CM | POA: Diagnosis not present

## 2022-07-31 DIAGNOSIS — I4891 Unspecified atrial fibrillation: Secondary | ICD-10-CM | POA: Diagnosis present

## 2022-07-31 DIAGNOSIS — I4811 Longstanding persistent atrial fibrillation: Secondary | ICD-10-CM

## 2022-07-31 DIAGNOSIS — A419 Sepsis, unspecified organism: Secondary | ICD-10-CM | POA: Diagnosis present

## 2022-07-31 DIAGNOSIS — S3991XA Unspecified injury of abdomen, initial encounter: Secondary | ICD-10-CM | POA: Diagnosis not present

## 2022-07-31 DIAGNOSIS — W19XXXA Unspecified fall, initial encounter: Secondary | ICD-10-CM | POA: Diagnosis not present

## 2022-07-31 DIAGNOSIS — S22080A Wedge compression fracture of T11-T12 vertebra, initial encounter for closed fracture: Secondary | ICD-10-CM | POA: Diagnosis not present

## 2022-07-31 DIAGNOSIS — Z79899 Other long term (current) drug therapy: Secondary | ICD-10-CM | POA: Insufficient documentation

## 2022-07-31 DIAGNOSIS — I482 Chronic atrial fibrillation, unspecified: Secondary | ICD-10-CM | POA: Diagnosis not present

## 2022-07-31 DIAGNOSIS — Z8673 Personal history of transient ischemic attack (TIA), and cerebral infarction without residual deficits: Secondary | ICD-10-CM | POA: Insufficient documentation

## 2022-07-31 DIAGNOSIS — S299XXA Unspecified injury of thorax, initial encounter: Secondary | ICD-10-CM | POA: Diagnosis not present

## 2022-07-31 DIAGNOSIS — I10 Essential (primary) hypertension: Secondary | ICD-10-CM | POA: Diagnosis not present

## 2022-07-31 DIAGNOSIS — Y92129 Unspecified place in nursing home as the place of occurrence of the external cause: Secondary | ICD-10-CM | POA: Insufficient documentation

## 2022-07-31 DIAGNOSIS — M549 Dorsalgia, unspecified: Secondary | ICD-10-CM | POA: Diagnosis not present

## 2022-07-31 DIAGNOSIS — R58 Hemorrhage, not elsewhere classified: Secondary | ICD-10-CM | POA: Diagnosis not present

## 2022-07-31 DIAGNOSIS — M25461 Effusion, right knee: Secondary | ICD-10-CM | POA: Diagnosis not present

## 2022-07-31 DIAGNOSIS — E785 Hyperlipidemia, unspecified: Secondary | ICD-10-CM | POA: Diagnosis present

## 2022-07-31 DIAGNOSIS — T1490XA Injury, unspecified, initial encounter: Secondary | ICD-10-CM | POA: Diagnosis not present

## 2022-07-31 DIAGNOSIS — Z85528 Personal history of other malignant neoplasm of kidney: Secondary | ICD-10-CM | POA: Insufficient documentation

## 2022-07-31 DIAGNOSIS — W1830XA Fall on same level, unspecified, initial encounter: Secondary | ICD-10-CM

## 2022-07-31 DIAGNOSIS — I517 Cardiomegaly: Secondary | ICD-10-CM | POA: Diagnosis not present

## 2022-07-31 DIAGNOSIS — I619 Nontraumatic intracerebral hemorrhage, unspecified: Secondary | ICD-10-CM | POA: Diagnosis present

## 2022-07-31 DIAGNOSIS — S3210XA Unspecified fracture of sacrum, initial encounter for closed fracture: Secondary | ICD-10-CM | POA: Diagnosis not present

## 2022-07-31 DIAGNOSIS — N200 Calculus of kidney: Secondary | ICD-10-CM | POA: Diagnosis not present

## 2022-07-31 DIAGNOSIS — J90 Pleural effusion, not elsewhere classified: Secondary | ICD-10-CM | POA: Diagnosis not present

## 2022-07-31 DIAGNOSIS — N390 Urinary tract infection, site not specified: Secondary | ICD-10-CM | POA: Diagnosis present

## 2022-07-31 DIAGNOSIS — D61818 Other pancytopenia: Secondary | ICD-10-CM | POA: Diagnosis present

## 2022-07-31 DIAGNOSIS — I130 Hypertensive heart and chronic kidney disease with heart failure and stage 1 through stage 4 chronic kidney disease, or unspecified chronic kidney disease: Secondary | ICD-10-CM | POA: Diagnosis not present

## 2022-07-31 DIAGNOSIS — S32009A Unspecified fracture of unspecified lumbar vertebra, initial encounter for closed fracture: Secondary | ICD-10-CM | POA: Diagnosis not present

## 2022-07-31 LAB — CBC
HCT: 34.9 % — ABNORMAL LOW (ref 39.0–52.0)
Hemoglobin: 10.6 g/dL — ABNORMAL LOW (ref 13.0–17.0)
MCH: 27.9 pg (ref 26.0–34.0)
MCHC: 30.4 g/dL (ref 30.0–36.0)
MCV: 91.8 fL (ref 80.0–100.0)
Platelets: 73 10*3/uL — ABNORMAL LOW (ref 150–400)
RBC: 3.8 MIL/uL — ABNORMAL LOW (ref 4.22–5.81)
RDW: 19.1 % — ABNORMAL HIGH (ref 11.5–15.5)
WBC: 3.1 10*3/uL — ABNORMAL LOW (ref 4.0–10.5)
nRBC: 0 % (ref 0.0–0.2)

## 2022-07-31 LAB — I-STAT CHEM 8, ED
BUN: 33 mg/dL — ABNORMAL HIGH (ref 8–23)
Calcium, Ion: 1.28 mmol/L (ref 1.15–1.40)
Chloride: 106 mmol/L (ref 98–111)
Creatinine, Ser: 1.6 mg/dL — ABNORMAL HIGH (ref 0.61–1.24)
Glucose, Bld: 165 mg/dL — ABNORMAL HIGH (ref 70–99)
HCT: 33 % — ABNORMAL LOW (ref 39.0–52.0)
Hemoglobin: 11.2 g/dL — ABNORMAL LOW (ref 13.0–17.0)
Potassium: 5 mmol/L (ref 3.5–5.1)
Sodium: 140 mmol/L (ref 135–145)
TCO2: 25 mmol/L (ref 22–32)

## 2022-07-31 LAB — COMPREHENSIVE METABOLIC PANEL
ALT: 11 U/L (ref 0–44)
AST: 25 U/L (ref 15–41)
Albumin: 3.9 g/dL (ref 3.5–5.0)
Alkaline Phosphatase: 96 U/L (ref 38–126)
Anion gap: 9 (ref 5–15)
BUN: 30 mg/dL — ABNORMAL HIGH (ref 8–23)
CO2: 24 mmol/L (ref 22–32)
Calcium: 10.2 mg/dL (ref 8.9–10.3)
Chloride: 104 mmol/L (ref 98–111)
Creatinine, Ser: 1.57 mg/dL — ABNORMAL HIGH (ref 0.61–1.24)
GFR, Estimated: 42 mL/min — ABNORMAL LOW (ref >60)
Glucose, Bld: 172 mg/dL — ABNORMAL HIGH (ref 70–99)
Potassium: 4.9 mmol/L (ref 3.5–5.1)
Sodium: 137 mmol/L (ref 135–145)
Total Bilirubin: 1.3 mg/dL — ABNORMAL HIGH (ref 0.3–1.2)
Total Protein: 6.7 g/dL (ref 6.5–8.1)

## 2022-07-31 LAB — URINALYSIS, ROUTINE W REFLEX MICROSCOPIC
Bilirubin Urine: NEGATIVE
Glucose, UA: 50 mg/dL — AB
Hgb urine dipstick: NEGATIVE
Ketones, ur: NEGATIVE mg/dL
Nitrite: POSITIVE — AB
Protein, ur: 100 mg/dL — AB
Specific Gravity, Urine: 1.027 (ref 1.005–1.030)
pH: 5 (ref 5.0–8.0)

## 2022-07-31 LAB — PROTIME-INR
INR: 2.3 — ABNORMAL HIGH (ref 0.8–1.2)
Prothrombin Time: 25.1 seconds — ABNORMAL HIGH (ref 11.4–15.2)

## 2022-07-31 MED ORDER — ONDANSETRON HCL 4 MG/2ML IJ SOLN: 4.0000 mg | Freq: Four times a day (QID) | INTRAMUSCULAR | Status: DC | PRN

## 2022-07-31 MED ORDER — CEFAZOLIN SODIUM-DEXTROSE 2-4 GM/100ML-% IV SOLN
2.0000 g | Freq: Three times a day (TID) | INTRAVENOUS | Status: DC
  Administered 2022-07-31: 2 g via INTRAVENOUS
  Filled 2022-07-31: qty 100

## 2022-07-31 MED ORDER — ACETAMINOPHEN 650 MG RE SUPP: 650.0000 mg | Freq: Four times a day (QID) | RECTAL | Status: DC | PRN

## 2022-07-31 MED ORDER — DEXTROSE IN LACTATED RINGERS 5 % IV SOLN: INTRAVENOUS | Status: DC

## 2022-07-31 MED ORDER — POLYETHYLENE GLYCOL 3350 17 G PO PACK
17.0000 g | PACK | Freq: Every day | ORAL | Status: DC
  Administered 2022-08-01: 17 g via ORAL
  Filled 2022-07-31: qty 1

## 2022-07-31 MED ORDER — SODIUM CHLORIDE 0.9 % IV SOLN
1.0000 g | INTRAVENOUS | Status: DC
  Administered 2022-07-31: 1 g via INTRAVENOUS
  Filled 2022-07-31: qty 10

## 2022-07-31 MED ORDER — IOHEXOL 350 MG/ML SOLN
75.0000 mL | Freq: Once | INTRAVENOUS | Status: AC | PRN
  Administered 2022-07-31: 75 mL via INTRAVENOUS

## 2022-07-31 MED ORDER — DULOXETINE HCL 20 MG PO CPEP
20.0000 mg | ORAL_CAPSULE | Freq: Every day | ORAL | Status: DC
  Administered 2022-07-31 – 2022-08-01 (×2): 20 mg via ORAL
  Filled 2022-07-31 (×3): qty 1

## 2022-07-31 MED ORDER — ACETAMINOPHEN 325 MG PO TABS
650.0000 mg | ORAL_TABLET | Freq: Four times a day (QID) | ORAL | Status: DC | PRN
  Administered 2022-07-31: 650 mg via ORAL
  Filled 2022-07-31: qty 2

## 2022-07-31 MED ORDER — ONDANSETRON HCL 4 MG PO TABS: 4.0000 mg | ORAL_TABLET | Freq: Four times a day (QID) | ORAL | Status: DC | PRN

## 2022-07-31 NOTE — Care Plan (Signed)
Patient discussed with EDP.  Imaging reviewed.  S4 fracture with lumbar fractures.  Will defer official WB recommendation to neurosurgery, but sacral fracture amenable to WBAT.    Full consult to follow.  Georgeanna Harrison M.D. Orthopaedic Surgery [facility]

## 2022-07-31 NOTE — ED Notes (Signed)
X-ray in progress 

## 2022-07-31 NOTE — ED Notes (Signed)
Pt log rolled

## 2022-07-31 NOTE — Progress Notes (Signed)
   07/31/22 1654  Spiritual Encounters  Type of Visit Initial  Care provided to: Patient  Conversation partners present during encounter Nurse  Referral source Trauma page  Reason for visit Trauma   Responded to Fall on Thinners code.  Patient not available, no support person present. Checked in with nurse. Chaplain remains available as needed.

## 2022-07-31 NOTE — ED Provider Notes (Signed)
I saw and evaluated the patient, reviewed the resident's note and I agree with the findings and plan.   89 year old male here after mechanical fall just prior to arrival.  Patient is on Coumadin.  Complains of pain to his occiput.  Also notes some right knee discomfort as well to.  Complains of epigastric abdominal discomfort.  Plan will be for patient to have imaging of his head, cervical spine as well as his chest and abdomen.  Will also x-ray patient's right knee.  Right knee appears to be a prepatellar cellulitis.     Lacretia Leigh, MD 07/31/22 [phone]

## 2022-07-31 NOTE — ED Triage Notes (Signed)
PT arrived via [facility] for cc of level 2 fall on thinners from [facility]. Unwitnessed fall, unknown down time, but known to have happened in the last few hours today. Pt reports slipping and falling while walking. Facility reports increase in falls, last one was 4 days ago resulting in hematoma to forehead, injury to right knee that remains swollen, tender, and red. Today pt is reporting back pain, new hematoma on back left of head. PMH of dementia, facility reports pt is at cognitive baseline. Ccollar in place.   BP 110/70 HR 60-70

## 2022-07-31 NOTE — H&P (Signed)
History and Physical    Patient: Peter Singh VQQ:[id number] DOB: 1932-01-06 DOA: 07/31/2022 DOS: the patient was seen and examined on 07/31/2022 PCP: [name], MD  Patient coming from: SNF  Chief Complaint:  Chief Complaint  Patient presents with   Fall   HPI: Peter Singh is a 89 y.o. male with medical history significant of atrial fibrillation on chronic Coumadin, BPH, history of renal carcinoma, history of multiple CVAs, peripheral vascular disease, chronic kidney disease stage III, essential hypertension was brought in by his wife secondary to a fall.  Patient also has vascular dementia.  He apparently was in the room with his wife.  When he sustained a fall.  He had a fall last week and also.  He states that [facility] where he was staying.  Patient therefore was brought to the ER.  He has multiple bruises.  He appears to have hit his head and has a laceration to his right forehead.  Workup in the ED showed small hyperdensity along the right tentorial leaflet worrisome for intracranial hemorrhage.  There is also an acute S4 vertebral fracture with questionable L4 T4 fracture.  Orthopedics and trauma surgery consulted.  Recommendation at this point is to admit the patient overnight.  Repeat head CT in the morning.  Patient being admitted to the medical service.  Review of Systems: As mentioned in the history of present illness. All other systems reviewed and are negative. Past Medical History:  Diagnosis Date   Adult failure to thrive    Per incoming records from [facility])   Allergic rhinitis    Per incoming records from [facility]   Atrial fibrillation [facility])    BPH (benign prostatic hyperplasia)    Per incoming records from [place] of kidney [facility])    Right, Per incoming records from [facility]    Per incoming records from [facility]   Cerebrovascular accident,  late effects    Per incoming records from [place] kidney disease, stage III (moderate) ([place])    Per incoming records from [facility])    Per incoming records from [facility]   Cognitive impairment    Mild, Per incoming records from [facility]   Constipation    Per incoming records from [facility]   Dementia [facility])    Diarrhea    Better off Aricept, Per incoming records from [facility]   Diastolic dysfunction    on 2D Echo 07/2009 and 2016, Per incoming records from [facility]   Diverticulosis    Mild, Per incoming records from [facility]   Diverticulosis of colon (without mention of hemorrhage)    ED (erectile dysfunction)    Per incoming records from [facility]   History of Coumadin therapy    Per incoming records from [facility]   History of echocardiogram 09/2014   Per incoming records from [facility]   Hyperlipemia    Hypertension    Kidney mass    Per incoming records from [place] leg edema    Per incoming records from [facility])    Neuropathy    Per incoming records from [facility]   OA (osteoarthritis)    Per incoming records from [facility]   Peripheral neuropathy  Per incoming records from [place] history of colonic polyps 1999 & 2004   adenomatous polyps   Pulmonary arterial hypertension ([place])    Per incoming records from [place]    Per incoming records from [facility]   Thrombocytopenia [facility]) 2017   Per incoming records from [facility]   UTI (urinary tract infection)    Sepsis, Per incoming records from [place] as ambulation aid    Per incoming records from [facility]   Past Surgical History:  Procedure Laterality Date   APPENDECTOMY     Per incoming records from [place]     Per incoming records from [place]  11/21/2019   KNEE SURGERY     right   PILONIDAL CYST DRAINAGE     POLYPECTOMY     Per incoming records from [place] tumor lumbar spine     SPINE SURGERY     tumor removed   THORACIC 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, Per incoming records from [place] History:  reports that he quit smoking about 47 years ago. His smoking use included cigarettes. He smoked an average of 3 packs per day. He has never used smokeless tobacco. He reports current alcohol use. He reports that he does not use drugs.  Allergies  Allergen Reactions   Penicillins Rash    Has patient had a PCN reaction causing immediate rash, facial/tongue/throat swelling, SOB or lightheadedness with hypotension: unknown Has patient had a PCN reaction causing severe rash involving mucus membranes or skin necrosis: unknown Has patient had a PCN reaction that required hospitalization : unknown Has patient had a PCN reaction occurring within the last 10 years: no If all of the above answers are "NO", then may proceed with Cephalosporin use.     Family History  Problem Relation Age of Onset   Stroke Father    CVA Father    Tuberculosis Father    Neuropathy Neg Hx     Prior to Admission medications   Medication Sig Start Date End Date Taking? Authorizing Provider  acetaminophen (TYLENOL) 500 MG tablet Take 1,000 mg by mouth 2 (two) times daily.    [provider]  cholecalciferol (VITAMIN D3) 25 MCG (1000 UT) tablet Take 2,000 Units by mouth daily.     [provider]   Cranberry 400 MG CAPS Take 400 mg by mouth every morning. 400 mg, oral, Once a morning    [provider]  DULoxetine (CYMBALTA) 20 MG capsule Take 20 mg by mouth daily.    [provider]  furosemide (LASIX) 20 MG tablet Take 2 tablets (40 mg total) by mouth daily. Take extra '20mg'$  for weight gain of 2-3lbs in 1 day or 5 lbs in 1 week 12/16/21   Domenic Polite, MD  gabapentin (NEURONTIN) 100 MG capsule Take 200 mg by mouth at bedtime. 03/26/22   [provider]  ketoconazole (NIZORAL) 2 % shampoo Apply 1 Application topically 2 (two) times a week.    [provider]  polyethylene glycol (MIRALAX / GLYCOLAX) 17 g packet Take 17 g by mouth daily.    [provider]  potassium chloride (MICRO-K) 10 MEQ CR capsule Take 10  mEq by mouth every Tuesday, Thursday, and Saturday at 6 PM. Every Tues., Thurs., Sat. 7:00 am - 3:00 pm, per [facility].    [provider]  triamcinolone lotion (KENALOG) 0.1 % Apply 1 application. topically 2 (two) times daily as needed (rash on back).    [provider]  warfarin (COUMADIN) 4 MG tablet Take 4 mg by mouth daily. Once a day on Mon, Wed, Fri, Sat 4:00 pm - 7:00 pm    [provider]  warfarin (COUMADIN) 5 MG tablet Take 5 mg by mouth daily. Give PO every Sun, Tues, Thurs 4:00 pm - 7:00 pm    [provider]    Physical Exam: Vitals:   07/31/22 1930 07/31/22 1945 07/31/22 2115 07/31/22 2200  BP: (!) 156/73 (!) 160/82 128/79 (!) 140/75  Pulse: 64 80 (!) 59 88  Resp: 18 (!) 25 (!) 25 12  Temp:      TempSrc:      SpO2: 100% 100% 100% 100%  Weight:      Height:       Constitutional: Chronically ill looking, pleasant, fully awake and alert NAD, calm, comfortable Eyes: PERRL, right eyelid swelling, and conjunctivae normal ENMT: Mucous membranes are moist. Posterior pharynx clear of any exudate or lesions.Normal dentition.  Neck: normal, supple, no masses, no  thyromegaly Respiratory: clear to auscultation bilaterally, no wheezing, no crackles. Normal respiratory effort. No accessory muscle use.  Cardiovascular: Irregularly irregular rate and rhythm, no murmurs / rubs / gallops. No extremity edema. 2+ pedal pulses. No carotid bruits.  Abdomen: no tenderness, no masses palpated. No hepatosplenomegaly. Bowel sounds positive.  Musculoskeletal: Good range of motion, no joint swelling or tenderness, Skin: Multiple skin bruises no induration Neurologic: CN 2-12 grossly intact. Sensation intact, DTR normal. Strength 5/5 in all 4.  Psychiatric: Normal judgment and insight. Alert and oriented x 3. Normal mood  Data Reviewed:  Temperature 97.9, blood pressure was still 94, pulse 59, respiratory 24 oxygen sat 91% room air.  White count 3.1 hemoglobin 10.6 and platelets 73.  Sodium 137 potassium 4.9 chloride 104.  CO2 24 BUN 30 creatinine 1.57 calcium 10.2 INR 2.3 glucose 172.  Urinalysis showed WBC 11-20 with many bacteria.  Positive nitrite.  CT cervical spine shows acute fracture through the S4 vertebral body possible acute fracture of the right L4 transverse process severe spinal cord stenosis from L1-L2 level to L4-L5 and severe bilateral neuroforaminal stenosis from the L3-L4 to the L5-S1 levels.  Just mild to moderate spinal canal stenosis at C3-C4.  Chest x-ray no acute findings.  X-ray of the right knee showed small joint effusions and prepatellar swelling.  Assessment and Plan:  #1 status post fall: Multiple falls.  This could be related to his numerous degenerative disc disease.  Patient will be admitted.  PT OT consultation.  He is from [facility].  Due to this numerous falls we will stop his warfarin permanently.  #2 intracranial hemorrhage: Secondary to fall with Coumadin.  Patient will be admitted overnight.  Repeat head CT in the morning.  If no change in the size of the bleeding patient may be discharged home to follow-up with PCP.  #3 sacral  fracture: Patient has S4 fracture with multiple other fractures.  Orthopedic consulted with recommendations noted.    #4 atrial fibrillation: Focus will be on rate control.  No anticoagulation.  Patient and wife agreed with stopping anticoagulation altogether.  Will give a small dose of vitamin K to reverse INR.  #  5 vascular dementia: Pleasant.  Very good remote memory.  No agitation.  #6 essential hypertension, blood pressure appears controlled.  No change in therapy.  #7 peripheral vascular disease: Stable.  #8 UTI: Empirically start Rocephin.  #9 pancytopenia: Probably reactive.  Monitor clinically.  #21 chronic diastolic heart failure: Appears compensated.  #11 hyperlipidemia: Continue statin    Advance Care Planning:   Code Status: DNR   Consults: Dr. Mable Fill, orthopedic surgery Dr. Rosendo Gros trauma surgery  Family Communication: Wife at bedside  Severity of Illness: The appropriate patient status for this patient is OBSERVATION. Observation status is judged to be reasonable and necessary in order to provide the required intensity of service to ensure the patient's safety. The patient's presenting symptoms, physical exam findings, and initial radiographic and laboratory data in the context of their medical condition is felt to place them at decreased risk for further clinical deterioration. Furthermore, it is anticipated that the patient will be medically stable for discharge from the hospital within 2 midnights of admission.   AuthorBarbette Merino, MD 07/31/2022 10:33 PM  For on call review [email or web address].

## 2022-07-31 NOTE — ED Provider Notes (Signed)
[facility] Provider Note   CSN: [id number] Arrival date & time: 07/31/22  1639     History  Chief Complaint  Patient presents with   Peter Singh is a 89 y.o. male.  Peter Singh is an 89 year old male has medical history of atrial fibrillation on Coumadin, vascular dementia presenting to the emergency department for ground-level fall at his skilled nursing facility.  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  Patient complains of no other acute pain.    Fall       Home Medications Prior to Admission medications   Medication Sig Start Date End Date Taking? Authorizing Provider  acetaminophen (TYLENOL) 500 MG tablet Take 1,000 mg by mouth 2 (two) times daily.   Yes [provider]  cholecalciferol (VITAMIN D3) 25 MCG (1000 UT) tablet Take 2,000 Units by mouth daily.    Yes [provider]  Cranberry 400 MG CAPS Take 400 mg by mouth every morning. 400 mg, oral, Once a morning   Yes [provider]  DULoxetine (CYMBALTA) 20 MG capsule Take 20 mg by mouth daily.   Yes [provider]  furosemide (LASIX) 20 MG tablet Take 2 tablets (40 mg total) by mouth daily. Take extra '20mg'$  for weight gain of 2-3lbs in 1 day or 5 lbs in 1 week 12/16/21  Yes Domenic Polite, MD  gabapentin (NEURONTIN) 100 MG capsule Take 200 mg by mouth at bedtime. 03/26/22  Yes [provider]  ketoconazole (NIZORAL) 2 % shampoo Apply 1 Application topically 2 (two) times a week.   Yes [provider]   polyethylene glycol (MIRALAX / GLYCOLAX) 17 g packet Take 17 g by mouth daily.   Yes [provider]  potassium chloride (MICRO-K) 10 MEQ CR capsule Take 10 mEq by mouth every Tuesday, Thursday, and Saturday at 6 PM. Every Tues., Thurs., Sat. 7:00 am - 3:00 pm, per [facility].   Yes [provider]  predniSONE (DELTASONE) 20 MG tablet Take 20 mg by mouth daily. 07/30/22  Yes [provider]  warfarin (COUMADIN) 4 MG tablet Take 4 mg by mouth daily. Once a day on Mon, Wed, Fri, Sat 4:00 pm - 7:00 pm   Yes [provider]  triamcinolone lotion (KENALOG) 0.1 % Apply 1 application. topically 2 (two) times daily as needed (rash on back).    [provider]  warfarin (COUMADIN) 5 MG tablet Take 5 mg by mouth daily. Give PO every Sun, Tues, Thurs 4:00 pm - 7:00 pm    [provider]      Allergies    Penicillins    Review of Systems   Review of Systems  Physical Exam Updated Vital Signs BP (!) 146/78   Pulse 77   Temp 98.3 F (36.8 C) (Oral)   Resp 19   Ht '6\' 7"'$  (2.007 m)   Wt 119 kg   SpO2 98%   BMI 29.55 kg/m  Physical Exam Vitals and nursing note reviewed.  Constitutional:  General: He is not in acute distress.    Appearance: He is well-developed.  HENT:     Head:     Comments: Contusion to the posterior scalp     Mouth/Throat:     Mouth: Mucous membranes are moist.  Eyes:     Conjunctiva/sclera: Conjunctivae normal.  Cardiovascular:     Rate and Rhythm: Normal rate and regular rhythm.     Heart sounds: No murmur heard. Pulmonary:     Effort: Pulmonary effort is normal. No respiratory distress.     Breath sounds: Normal breath sounds.  Abdominal:     Palpations: Abdomen is soft.     Tenderness: There is no abdominal tenderness.  Musculoskeletal:        General: No swelling.     Cervical back: Neck supple.  Skin:    General: Skin is warm and dry.     Capillary Refill: Capillary refill takes less than 2  seconds.  Neurological:     General: No focal deficit present.     Mental Status: He is alert. He is disoriented.     Sensory: No sensory deficit.     Motor: No weakness.     Coordination: Coordination normal.  Psychiatric:        Mood and Affect: Mood normal.     ED Results / Procedures / Treatments   Labs (all labs ordered are listed, but only abnormal results are displayed) Labs Reviewed  COMPREHENSIVE METABOLIC PANEL - Abnormal; Notable for the following components:      Result Value   Glucose, Bld 172 (*)    BUN 30 (*)    Creatinine, Ser 1.57 (*)    Total Bilirubin 1.3 (*)    GFR, Estimated 42 (*)    All other components within normal limits  CBC - Abnormal; Notable for the following components:   WBC 3.1 (*)    RBC 3.80 (*)    Hemoglobin 10.6 (*)    HCT 34.9 (*)    RDW 19.1 (*)    Platelets 73 (*)    All other components within normal limits  URINALYSIS, ROUTINE W REFLEX MICROSCOPIC - Abnormal; Notable for the following components:   APPearance HAZY (*)    Glucose, UA 50 (*)    Protein, ur 100 (*)    Nitrite POSITIVE (*)    Leukocytes,Ua SMALL (*)    Bacteria, UA MANY (*)    All other components within normal limits  PROTIME-INR - Abnormal; Notable for the following components:   Prothrombin Time 25.1 (*)    INR 2.3 (*)    All other components within normal limits  I-STAT CHEM 8, ED - Abnormal; Notable for the following components:   BUN 33 (*)    Creatinine, Ser 1.60 (*)    Glucose, Bld 165 (*)    Hemoglobin 11.2 (*)    HCT 33.0 (*)    All other components within normal limits  ETHANOL  LACTIC ACID, PLASMA  COMPREHENSIVE METABOLIC PANEL  CBC  SAMPLE TO BLOOD BANK    EKG None  Radiology CT CHEST ABDOMEN PELVIS W CONTRAST  Result Date: 07/31/2022 CLINICAL DATA:  Polytrauma, blunt [id number] Trauma [id number] EXAM: CT CHEST, ABDOMEN, AND PELVIS WITH CONTRAST TECHNIQUE: Multidetector CT imaging of the chest, abdomen and pelvis was performed following the  standard protocol during bolus administration of intravenous contrast. RADIATION DOSE REDUCTION: This exam was performed according to the departmental dose-optimization program which includes automated exposure control, adjustment of the mA and/or  kV according to patient size and/or use of iterative reconstruction technique. CONTRAST:  69m OMNIPAQUE IOHEXOL 350 MG/ML SOLN COMPARISON:  CT abdomen pelvis 06/10/2018, CT abdomen pelvis 06/17/2019 FINDINGS: CHEST: Cardiovascular: No aortic injury. The ascending thoracic aorta is enlarged in caliber measuring up to 4.3 cm. The descending thoracic aorta is normal in caliber. Severe atherosclerotic plaque. Aortic valve leaflet calcification. At least 2 vessel coronary artery calcification. The heart is mildly enlarged in size. No significant pericardial effusion. The main pulmonary artery is enlarged in caliber measuring up to 3.5 cm. No main pulmonary embolus. Mediastinum/Nodes: No pneumomediastinum. No mediastinal hematoma. The esophagus is unremarkable. The thyroid is unremarkable. The central airways are patent. No mediastinal, hilar, or axillary lymphadenopathy. Lungs/Pleura: Diffuse bronchial wall thickening. No focal consolidation. Left apical 7 mm subsolid pulmonary nodule (5:5). Slightly more inferior 5 mm solid left upper lobe pulmonary nodule (5:53). Right middle lobe 5 mm ground-glass airspace opacity (5:101). No pulmonary mass. No pulmonary contusion or laceration. No pneumatocele formation. Low-density right pleural fluid which may represent a trace pleural effusion versus chylothorax. No pneumothorax. No hemothorax. Musculoskeletal/Chest wall: No chest wall mass. No acute rib or sternal fracture. Please see separately dictated CT thoracolumbar spine 07/31/2022. ABDOMEN / PELVIS: Hepatobiliary: Not enlarged. No focal lesion. No laceration or subcapsular hematoma. The gallbladder is otherwise unremarkable with no radio-opaque gallstones. No biliary ductal  dilatation. Pancreas: Normal pancreatic contour. No main pancreatic duct dilatation. Spleen: Not enlarged. No focal lesion. No laceration, subcapsular hematoma, or vascular injury. Adrenals/Urinary Tract: No nodularity bilaterally. Nonspecific bilateral perinephric fat stranding. Bilateral kidneys enhance symmetrically. Interval increase in size of a right superior renal pole mass measuring at least 5 cm (from 3.7 cm) versus interval development of an adjacent new mass. Punctate left nephrolithiasis. No hydronephrosis. No contusion, laceration, or subcapsular hematoma. No injury to the vascular structures or collecting systems. No hydroureter. Suprapubic catheter terminates within in the collapsed urinary bladder lumen. Several calcified stones are noted within the gallbladder lumen measuring up to 8 mm. Stomach/Bowel: No small or large bowel wall thickening or dilatation. The appendix is not definitely identified with no inflammatory changes in the right lower quadrant to suggest acute appendicitis. Vasculature/Lymphatics: Severe atherosclerotic plaque. No abdominal aorta or iliac aneurysm. No active contrast extravasation or pseudoaneurysm. No abdominal, pelvic, inguinal lymphadenopathy. Reproductive: Prostate enlarged in size measuring up to 6.2 cm. Other: No simple free fluid ascites. No pneumoperitoneum. No hemoperitoneum. No mesenteric hematoma identified. No organized fluid collection. Musculoskeletal: No significant soft tissue hematoma. Age-indeterminate, possibly acute, displaced right S4 sacral ala fracture with extension to the right S4 and S5 sacral foramina as well as extension to the left S3 sacral ala and left S3 sacral foramina (7:136, 8: 46). Please see separately dictated CT thoracolumbar spine 07/31/2022. No extension to the sacroiliac joints. No pelvic diastasis. No other acute displaced pelvic fracture. Ports and Devices: None. IMPRESSION: 1. Age-indeterminate, possibly acute, displaced  bilateral sacral ala fractures extending to the right S4 and S5 as well as left S3 sacral foramina. 2. No acute intrathoracic, intra-abdominal, intrapelvic traumatic injury. 3. Please see separately dictated CT thoracolumbar spine as well as CT head and C-spine 07/31/2022. Other imaging findings of potential clinical significance: 1. Interval increase in size of a right superior renal pole mass measuring at least 5 cm (from 3.7 cm) versus interval development of an adjacent new mass. Findings suggestive of malignancy. When the patient is clinically stable and able to follow directions and hold their breath (preferably as an outpatient)  further evaluation with dedicated MRI renal protocol should be considered. 2. 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 Recommend annual imaging followup by CTA or MRA. This recommendation follows 2010 ACCF/AHA/AATS/ACR/ASA/SCA/SCAI/SIR/STS/SVM Guidelines for the Diagnosis and Management of Patients with Thoracic Aortic Disease. Circulation. 2010; 121: E081-K481. Aortic aneurysm NOS (ICD10-I71.9) . 3. Mild cardiomegaly. 4. Enlarged main pulmonary artery suggestive of pulmonary hypertension. 5. Left apical 7 mm subsolid pulmonary nodule. Follow-up non-contrast CT recommended at 3-6 months to confirm persistence. If unchanged, and solid component remains <6 mm, annual CT is recommended until 5 years of stability has been established. If persistent these nodules should be considered highly suspicious if the solid component of the nodule is 6 mm or greater in size and enlarging. This recommendation follows the consensus statement: Guidelines for Management of Incidental Pulmonary Nodules Detected on CT Images: From the Fleischner Society 2017; Radiology 2017; 284:228-243. 6. Low-density right pleural fluid which may represent a trace pleural effusion versus chylothorax. 7. Nonobstructive left nephrolithiasis. 8. Calcified stones within the urinary bladder lumen. Urinary bladder  decompressed with suprapubic catheter in appropriate position. 9. Prostatomegaly. 10. Aortic Atherosclerosis (ICD10-I70.0) including coronary artery calcification and aortic valve leaflet calcifications-correlate for aortic stenosis. Electronically Signed   By: Iven Finn M.D.   On: 07/31/2022 18:45   CT HEAD WO CONTRAST  Result Date: 07/31/2022 CLINICAL DATA:  Trauma EXAM: CT HEAD WITHOUT CONTRAST CT CERVICAL SPINE WITHOUT CONTRAST CT THORACIC SPINE WITHOUT CONTRAST CT LUMBAR SPINE WITHOUT CONTRAST TECHNIQUE: Multidetector CT imaging of the head and cervical, thoracic, and lumbar spine was performed following the standard protocol without intravenous contrast. Multiplanar CT image reconstructions of the cervical spine were also generated. RADIATION DOSE REDUCTION: This exam was performed according to the departmental dose-optimization program which includes automated exposure control, adjustment of the mA and/or kV according to patient size and/or use of iterative reconstruction technique. COMPARISON:  CT Head 05/01/19 FINDINGS: CT HEAD FINDINGS Brain: No evidence of acute infarction, hydrocephalus, or mass lesion/mass effect. There is a subtle hyperdensity along the right tentorial leaflet (series 5, image 49), which is new compared to 05/01/2019, and favored to represent a small amount of extra-axial blood products. There is advanced generalized volume loss. Vascular: No hyperdense vessel or unexpected calcification. Skull: Soft tissue swelling along the right frontal scalp in the left parietal scalp. No evidence of underlying calvarial fracture. Sinuses/Orbits: Bilateral lens replacement. No evidence of mastoid or middle ear effusion. Paranasal sinuses are clear. Other: None CT CERVICAL, THORACIC, LUMBAR SPINE FINDINGS Alignment: Grade 1 anterolisthesis of C5 on C6.  Otherwise normal. Skull base and vertebrae: No evidence SP cervical spine fracture. Multilevel degenerative changes. With likely  mild-to-moderate spinal canal stenosis at C3-C4. There has been posterior decompression at the T5-T8 level. No evidence of an acute thoracic spine fracture. There may be an acute fracture at the right L4 transverse process (series 2, image 54). There is an acute fracture through the S4 vertebral body. Soft tissues and spinal canal: See separately dictated CT chest abdomen and pelvis for soft tissue findings in the chest abdomen and pelvis. Disc levels: Limitations: Assessment of the spinal canal limited due to CT technique. Within this limitation Cervical spine: Likely mild-to-moderate spinal canal stenosis at C3-C4. Thoracic spine: No evidence of high-grade spinal canal stenosis. Lumbar spine: Congenitally short pedicles. Severe spinal canal stenosis from the L1-L2 level to the L4-L5 levels. There is also severe bilateral neural foraminal stenosis from the L3-L4 to the L5-S1 levels. Upper chest: See separately dictated CT chest  abdomen and pelvis. IMPRESSION: CT HEAD: 1. Subtle hyperdensity along the right tentorial leaflet, favored to represent a small amount of extra-axial blood products. Recommend follow up CT to ensure stability. 2. Right frontal and left parietal scalp soft tissue swelling. No evidence of underlying calvarial fracture. CT CERVICAL SPINE: No acute cervical spine fracture. CT THORACIC SPINE: No acute thoracic spine fracture. CT LUMBAR SPINE: 1. Acute fracture through the S4 vertebral body. 2. Possible acute fracture of the right L4 transverse process. 3. Severe spinal canal stenosis from the L1-L2 level to the L4-L5 levels. Severe bilateral neural foraminal stenosis from the L3-L4 to the L5-S1 levels. Electronically Signed   By: Marin Roberts M.D.   On: 07/31/2022 18:08   CT CERVICAL SPINE WO CONTRAST  Result Date: 07/31/2022 CLINICAL DATA:  Trauma EXAM: CT HEAD WITHOUT CONTRAST CT CERVICAL SPINE WITHOUT CONTRAST CT THORACIC SPINE WITHOUT CONTRAST CT LUMBAR SPINE WITHOUT CONTRAST TECHNIQUE:  Multidetector CT imaging of the head and cervical, thoracic, and lumbar spine was performed following the standard protocol without intravenous contrast. Multiplanar CT image reconstructions of the cervical spine were also generated. RADIATION DOSE REDUCTION: This exam was performed according to the departmental dose-optimization program which includes automated exposure control, adjustment of the mA and/or kV according to patient size and/or use of iterative reconstruction technique. COMPARISON:  CT Head 05/01/19 FINDINGS: CT HEAD FINDINGS Brain: No evidence of acute infarction, hydrocephalus, or mass lesion/mass effect. There is a subtle hyperdensity along the right tentorial leaflet (series 5, image 49), which is new compared to 05/01/2019, and favored to represent a small amount of extra-axial blood products. There is advanced generalized volume loss. Vascular: No hyperdense vessel or unexpected calcification. Skull: Soft tissue swelling along the right frontal scalp in the left parietal scalp. No evidence of underlying calvarial fracture. Sinuses/Orbits: Bilateral lens replacement. No evidence of mastoid or middle ear effusion. Paranasal sinuses are clear. Other: None CT CERVICAL, THORACIC, LUMBAR SPINE FINDINGS Alignment: Grade 1 anterolisthesis of C5 on C6.  Otherwise normal. Skull base and vertebrae: No evidence SP cervical spine fracture. Multilevel degenerative changes. With likely mild-to-moderate spinal canal stenosis at C3-C4. There has been posterior decompression at the T5-T8 level. No evidence of an acute thoracic spine fracture. There may be an acute fracture at the right L4 transverse process (series 2, image 54). There is an acute fracture through the S4 vertebral body. Soft tissues and spinal canal: See separately dictated CT chest abdomen and pelvis for soft tissue findings in the chest abdomen and pelvis. Disc levels: Limitations: Assessment of the spinal canal limited due to CT technique.  Within this limitation Cervical spine: Likely mild-to-moderate spinal canal stenosis at C3-C4. Thoracic spine: No evidence of high-grade spinal canal stenosis. Lumbar spine: Congenitally short pedicles. Severe spinal canal stenosis from the L1-L2 level to the L4-L5 levels. There is also severe bilateral neural foraminal stenosis from the L3-L4 to the L5-S1 levels. Upper chest: See separately dictated CT chest abdomen and pelvis. IMPRESSION: CT HEAD: 1. Subtle hyperdensity along the right tentorial leaflet, favored to represent a small amount of extra-axial blood products. Recommend follow up CT to ensure stability. 2. Right frontal and left parietal scalp soft tissue swelling. No evidence of underlying calvarial fracture. CT CERVICAL SPINE: No acute cervical spine fracture. CT THORACIC SPINE: No acute thoracic spine fracture. CT LUMBAR SPINE: 1. Acute fracture through the S4 vertebral body. 2. Possible acute fracture of the right L4 transverse process. 3. Severe spinal canal stenosis from the L1-L2 level to  the L4-L5 levels. Severe bilateral neural foraminal stenosis from the L3-L4 to the L5-S1 levels. Electronically Signed   By: Marin Roberts M.D.   On: 07/31/2022 18:08   CT T-SPINE NO CHARGE  Result Date: 07/31/2022 CLINICAL DATA:  Trauma EXAM: CT HEAD WITHOUT CONTRAST CT CERVICAL SPINE WITHOUT CONTRAST CT THORACIC SPINE WITHOUT CONTRAST CT LUMBAR SPINE WITHOUT CONTRAST TECHNIQUE: Multidetector CT imaging of the head and cervical, thoracic, and lumbar spine was performed following the standard protocol without intravenous contrast. Multiplanar CT image reconstructions of the cervical spine were also generated. RADIATION DOSE REDUCTION: This exam was performed according to the departmental dose-optimization program which includes automated exposure control, adjustment of the mA and/or kV according to patient size and/or use of iterative reconstruction technique. COMPARISON:  CT Head 05/01/19 FINDINGS: CT HEAD  FINDINGS Brain: No evidence of acute infarction, hydrocephalus, or mass lesion/mass effect. There is a subtle hyperdensity along the right tentorial leaflet (series 5, image 49), which is new compared to 05/01/2019, and favored to represent a small amount of extra-axial blood products. There is advanced generalized volume loss. Vascular: No hyperdense vessel or unexpected calcification. Skull: Soft tissue swelling along the right frontal scalp in the left parietal scalp. No evidence of underlying calvarial fracture. Sinuses/Orbits: Bilateral lens replacement. No evidence of mastoid or middle ear effusion. Paranasal sinuses are clear. Other: None CT CERVICAL, THORACIC, LUMBAR SPINE FINDINGS Alignment: Grade 1 anterolisthesis of C5 on C6.  Otherwise normal. Skull base and vertebrae: No evidence SP cervical spine fracture. Multilevel degenerative changes. With likely mild-to-moderate spinal canal stenosis at C3-C4. There has been posterior decompression at the T5-T8 level. No evidence of an acute thoracic spine fracture. There may be an acute fracture at the right L4 transverse process (series 2, image 54). There is an acute fracture through the S4 vertebral body. Soft tissues and spinal canal: See separately dictated CT chest abdomen and pelvis for soft tissue findings in the chest abdomen and pelvis. Disc levels: Limitations: Assessment of the spinal canal limited due to CT technique. Within this limitation Cervical spine: Likely mild-to-moderate spinal canal stenosis at C3-C4. Thoracic spine: No evidence of high-grade spinal canal stenosis. Lumbar spine: Congenitally short pedicles. Severe spinal canal stenosis from the L1-L2 level to the L4-L5 levels. There is also severe bilateral neural foraminal stenosis from the L3-L4 to the L5-S1 levels. Upper chest: See separately dictated CT chest abdomen and pelvis. IMPRESSION: CT HEAD: 1. Subtle hyperdensity along the right tentorial leaflet, favored to represent a small  amount of extra-axial blood products. Recommend follow up CT to ensure stability. 2. Right frontal and left parietal scalp soft tissue swelling. No evidence of underlying calvarial fracture. CT CERVICAL SPINE: No acute cervical spine fracture. CT THORACIC SPINE: No acute thoracic spine fracture. CT LUMBAR SPINE: 1. Acute fracture through the S4 vertebral body. 2. Possible acute fracture of the right L4 transverse process. 3. Severe spinal canal stenosis from the L1-L2 level to the L4-L5 levels. Severe bilateral neural foraminal stenosis from the L3-L4 to the L5-S1 levels. Electronically Signed   By: Marin Roberts M.D.   On: 07/31/2022 18:08   CT L-SPINE NO CHARGE  Result Date: 07/31/2022 CLINICAL DATA:  Trauma EXAM: CT HEAD WITHOUT CONTRAST CT CERVICAL SPINE WITHOUT CONTRAST CT THORACIC SPINE WITHOUT CONTRAST CT LUMBAR SPINE WITHOUT CONTRAST TECHNIQUE: Multidetector CT imaging of the head and cervical, thoracic, and lumbar spine was performed following the standard protocol without intravenous contrast. Multiplanar CT image reconstructions of the cervical spine were also  generated. RADIATION DOSE REDUCTION: This exam was performed according to the departmental dose-optimization program which includes automated exposure control, adjustment of the mA and/or kV according to patient size and/or use of iterative reconstruction technique. COMPARISON:  CT Head 05/01/19 FINDINGS: CT HEAD FINDINGS Brain: No evidence of acute infarction, hydrocephalus, or mass lesion/mass effect. There is a subtle hyperdensity along the right tentorial leaflet (series 5, image 49), which is new compared to 05/01/2019, and favored to represent a small amount of extra-axial blood products. There is advanced generalized volume loss. Vascular: No hyperdense vessel or unexpected calcification. Skull: Soft tissue swelling along the right frontal scalp in the left parietal scalp. No evidence of underlying calvarial fracture. Sinuses/Orbits:  Bilateral lens replacement. No evidence of mastoid or middle ear effusion. Paranasal sinuses are clear. Other: None CT CERVICAL, THORACIC, LUMBAR SPINE FINDINGS Alignment: Grade 1 anterolisthesis of C5 on C6.  Otherwise normal. Skull base and vertebrae: No evidence SP cervical spine fracture. Multilevel degenerative changes. With likely mild-to-moderate spinal canal stenosis at C3-C4. There has been posterior decompression at the T5-T8 level. No evidence of an acute thoracic spine fracture. There may be an acute fracture at the right L4 transverse process (series 2, image 54). There is an acute fracture through the S4 vertebral body. Soft tissues and spinal canal: See separately dictated CT chest abdomen and pelvis for soft tissue findings in the chest abdomen and pelvis. Disc levels: Limitations: Assessment of the spinal canal limited due to CT technique. Within this limitation Cervical spine: Likely mild-to-moderate spinal canal stenosis at C3-C4. Thoracic spine: No evidence of high-grade spinal canal stenosis. Lumbar spine: Congenitally short pedicles. Severe spinal canal stenosis from the L1-L2 level to the L4-L5 levels. There is also severe bilateral neural foraminal stenosis from the L3-L4 to the L5-S1 levels. Upper chest: See separately dictated CT chest abdomen and pelvis. IMPRESSION: CT HEAD: 1. Subtle hyperdensity along the right tentorial leaflet, favored to represent a small amount of extra-axial blood products. Recommend follow up CT to ensure stability. 2. Right frontal and left parietal scalp soft tissue swelling. No evidence of underlying calvarial fracture. CT CERVICAL SPINE: No acute cervical spine fracture. CT THORACIC SPINE: No acute thoracic spine fracture. CT LUMBAR SPINE: 1. Acute fracture through the S4 vertebral body. 2. Possible acute fracture of the right L4 transverse process. 3. Severe spinal canal stenosis from the L1-L2 level to the L4-L5 levels. Severe bilateral neural foraminal  stenosis from the L3-L4 to the L5-S1 levels. Electronically Signed   By: Marin Roberts M.D.   On: 07/31/2022 18:08   DG Knee 2 Views Right  Result Date: 07/31/2022 CLINICAL DATA:  Swelling, fall. EXAM: RIGHT KNEE - 1-2 VIEW COMPARISON:  Right knee x-ray 07/27/2022 FINDINGS: Small joint effusion persists. There is prepatellar soft tissue swelling similar to the prior study. There is no acute fracture or dislocation. There is mediolateral compartment degenerative change and chondrocalcinosis similar to prior. Peripheral vascular calcifications are present. IMPRESSION: 1. No acute fracture or dislocation. 2. Small joint effusion and prepatellar soft tissue swelling similar to prior study. Electronically Signed   By: Ronney Asters M.D.   On: 07/31/2022 17:15   DG Chest Port 1 View  Result Date: 07/31/2022 CLINICAL DATA:  Trauma, fall. EXAM: PORTABLE CHEST 1 VIEW COMPARISON:  Chest x-ray 07/27/2022 FINDINGS: Heart is mildly enlarged, unchanged. The lungs are clear. There is no pleural effusion or pneumothorax. No acute fractures are seen. IMPRESSION: No acute cardiopulmonary process. Electronically Signed   By: Warren Lacy  Dagoberto Reef M.D.   On: 07/31/2022 17:14   DG Pelvis Portable  Result Date: 07/31/2022 CLINICAL DATA:  Trauma EXAM: PORTABLE PELVIS 1-2 VIEWS COMPARISON:  Pelvis x-ray 07/27/2022 FINDINGS: There is no evidence of pelvic fracture or diastasis. No pelvic bone lesions are seen. There are moderate degenerative changes of both hips similar to the prior study. IMPRESSION: No acute bony abnormality. Moderate degenerative changes of both hips. Electronically Signed   By: Ronney Asters M.D.   On: 07/31/2022 17:12    Procedures Procedures    Medications Ordered in ED Medications  DULoxetine (CYMBALTA) DR capsule 20 mg (20 mg Oral Given 07/31/22 2220)  polyethylene glycol (MIRALAX / GLYCOLAX) packet 17 g (17 g Oral Not Given 07/31/22 2220)  dextrose 5 % in lactated ringers infusion ( Intravenous New  Bag/Given 07/31/22 2330)  acetaminophen (TYLENOL) tablet 650 mg (650 mg Oral Given 07/31/22 2220)    Or  acetaminophen (TYLENOL) suppository 650 mg ( Rectal See Alternative 07/31/22 2220)  ondansetron (ZOFRAN) tablet 4 mg (has no administration in time range)    Or  ondansetron (ZOFRAN) injection 4 mg (has no administration in time range)  cefTRIAXone (ROCEPHIN) 1 g in sodium chloride 0.9 % 100 mL IVPB (0 g Intravenous Stopped 08/01/22 0003)  iohexol (OMNIPAQUE) 350 MG/ML injection 75 mL (75 mLs Intravenous Contrast Given 07/31/22 1745)    ED Course/ Medical Decision Making/ A&P                             Medical Decision Making SALIOU BARNIER is a 89 year old male past medical history as document above presented to emergency department as a ground-level fall.  Patient was initially level as a level 2 trauma.  On arrival to the emergency department he is normotensive nontachycardic with normal O2 saturations.  Slight confusion patient's neurological exam is noncontributory.  Given the patient's age and status on blood thinners we are opting to obtain full trauma scans.  Trauma scans resulted showing a sacral fracture at the S4 vertebrae as well as a TP fracture at L4.  Patient also has endings concerning for trace intracranial hemorrhage.  Given this fact neurosurgery was consulted.  The trace hemorrhage is slight in nature and may be artifact in nature therefore patient was not immediately reversed per neurosurgery recommendations will also be following for the L4 TP fracture orthopedics was consulted to evaluate the S4 fracture and and agreed to evaluate the patient.  Patient has incidental findings of the lungs and kidneys.  Labs are notable for creatinine near patient's baseline electrolytes grossly within normal limits CBC is grossly within normal limits patient's INR is 2.3 urine shows concerning findings for a UTI.  On secondary survey it was noted the patient has a red erythematous portion  to his right knee.  This appears most consistent with bursitis.  Patient's lab values show no evidence of acute infection he is afebrile and does not appear septic therefore I am less concerned for a septic joint patient was given 2 g of IV Ancef for his acute bursitis.  However patient has a suprapubic catheter and may be colonized at baseline patient does not appear acutely septic therefore antibiotics were initially withheld.  Ultimately patient was admitted to trauma service for observation given his injuries.     Amount and/or Complexity of Data Reviewed Labs: ordered. Radiology: ordered.  Risk Prescription drug management. Decision regarding hospitalization.  Final Clinical Impression(s) / ED Diagnoses Final diagnoses:  Ground-level fall  Closed fracture of sacrum, unspecified portion of sacrum, initial encounter [facility])    Rx / Economy Orders ED Discharge Orders     None         Donzetta Matters, MD 08/01/22 3888    Lacretia Leigh, MD 08/02/22 1356

## 2022-07-31 NOTE — Consult Note (Addendum)
Reason for Consult: Fall from trauma Referring Physician: Dr. [name] is an 89 y.o. male.  HPI: Patient is an 89 year old male with dementia, A-fib-on Coumadin, chronic kidney disease, hypertension, neuropathy who comes in secondary to fall.Marland Kitchen  History is obtained by his wife is in the room.  She states that he was recently here this past weekend from a fall.  She states that he fell today again [facility].  She is unsure of the fall but appears to be ground-level.  Patient underwent workup per EDP.  Patient was found to have small hyperdensity along the right tentorial leaflet.  Patient also with acute S4 vertebral fracture.  Questionable L4 TP fracture.  Patient did not have his Coumadin reversed when EDP spoke with neurosurgery they will have follow-up CT scan.  They will also address his L4 fracture. Orthopedic to evaluate for acute fracture of the sacrum.  Past Medical History:  Diagnosis Date   Adult failure to thrive    Per incoming records from [facility]   Allergic rhinitis    Per incoming records from [facility]   Atrial fibrillation [facility])    BPH (benign prostatic hyperplasia)    Per incoming records from [place] of kidney [facility])    Right, Per incoming records from [place]    Per incoming records from [facility]   Cerebrovascular accident, late effects    Per incoming records from [place] kidney disease, stage III (moderate) ([place])    Per incoming records from [facility]    Per incoming records from [facility]   Cognitive impairment    Mild, Per incoming records from [facility]   Constipation    Per incoming records from [facility]   Dementia [facility])    Diarrhea    Better off Aricept, Per incoming records from [facility]   Diastolic dysfunction    on 2D Echo 07/2009 and 2016, Per incoming records from [facility]   Diverticulosis    Mild, Per incoming records from [facility]   Diverticulosis of colon (without mention of hemorrhage)    ED (erectile dysfunction)    Per incoming records from [facility]   History of Coumadin therapy    Per incoming records from [facility]   History of echocardiogram 09/2014   Per incoming records from [facility]   Hyperlipemia    Hypertension    Kidney mass    Per incoming records from [place] leg edema    Per incoming records from [facility])    Neuropathy    Per incoming records from [facility]   OA (osteoarthritis)    Per incoming records from [facility]   Peripheral neuropathy    Per incoming records from [place] history of colonic [place] & 2004   adenomatous polyps   Pulmonary arterial hypertension ([place])    Per incoming records from [place]    Per incoming records from [facility]   Thrombocytopenia [facility]) 2017   Per incoming records from [facility]   UTI (urinary tract infection)    Sepsis, Per incoming records from [place] as  ambulation aid    Per incoming records from [facility]    Past Surgical History:  Procedure Laterality Date   APPENDECTOMY     Per incoming records from [place]     Per incoming records from [place]  11/21/2019   KNEE SURGERY     right   PILONIDAL CYST DRAINAGE     POLYPECTOMY     Per incoming records from 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 His smoking use included cigarettes. He smoked an average of 3 packs per day. He has never used smokeless tobacco. He reports current alcohol use. He reports that he does not use drugs.  Allergies:  Allergies  Allergen Reactions   Penicillins Rash    Has patient had a PCN reaction causing immediate rash, facial/tongue/throat swelling, SOB or lightheadedness with hypotension: unknown Has patient had a PCN reaction causing severe rash involving mucus membranes or skin necrosis: unknown Has patient had a PCN reaction that required hospitalization : unknown Has patient had a PCN reaction occurring within the last 10 years: no If all of the above answers are "NO", then may proceed with Cephalosporin use.     Medications: I have reviewed the patient's current medications.  Results for orders placed or performed during the hospital encounter of 07/31/22 (from the past 48 hour(s))  Comprehensive metabolic panel     Status: Abnormal   Collection Time: 07/31/22  4:55 PM  Result Value Ref Range   Sodium 137 135 - 145 mmol/L   Potassium 4.9 3.5 - 5.1 mmol/L   Chloride 104 98 - 111 mmol/L   CO2 24 22 - 32 mmol/L   Glucose, Bld 172 (H) 70 - 99 mg/dL    Comment: Glucose reference range applies only to samples taken after fasting for at least 8 hours.   BUN 30 (H) 8 - 23 mg/dL   Creatinine, Ser 1.57 (H) 0.61 - 1.24 mg/dL   Calcium 10.2 8.9 - 10.3 mg/dL   Total Protein 6.7 6.5 - 8.1 g/dL   Albumin 3.9 3.5 - 5.0 g/dL   AST 25 15 - 41 U/L   ALT 11 0 - 44 U/L   Alkaline Phosphatase 96 38 - 126 U/L   Total Bilirubin 1.3 (H)  0.3 - 1.2 mg/dL   GFR, Estimated 42 (L) >60 mL/min    Comment: (NOTE) Calculated using the CKD-EPI Creatinine Equation (2021)    Anion gap 9 5 - 15    Comment: Performed at [place] [address] [place], [place] [address]  I-Stat Chem 8, ED     Status: Abnormal   Collection Time: 07/31/22  4:55 PM  Result Value Ref Range   Sodium 140 135 - 145 mmol/L   Potassium 5.0 3.5 - 5.1 mmol/L   Chloride 106 98 - 111 mmol/L   BUN 33 (H) 8 - 23 mg/dL   Creatinine,  Ser 1.60 (H) 0.61 - 1.24 mg/dL   Glucose, Bld 165 (H) 70 - 99 mg/dL    Comment: Glucose reference range applies only to samples taken after fasting for at least 8 hours.   Calcium, Ion 1.28 1.15 - 1.40 mmol/L   TCO2 25 22 - 32 mmol/L   Hemoglobin 11.2 (L) 13.0 - 17.0 g/dL   HCT 33.0 (L) 39.0 - 52.0 %  CBC     Status: Abnormal   Collection Time: 07/31/22  4:55 PM  Result Value Ref Range   WBC 3.1 (L) 4.0 - 10.5 K/uL   RBC 3.80 (L) 4.22 - 5.81 MIL/uL   Hemoglobin 10.6 (L) 13.0 - 17.0 g/dL   HCT 34.9 (L) 39.0 - 52.0 %   MCV 91.8 80.0 - 100.0 fL   MCH 27.9 26.0 - 34.0 pg   MCHC 30.4 30.0 - 36.0 g/dL   RDW 19.1 (H) 11.5 - 15.5 %   Platelets 73 (L) 150 - 400 K/uL    Comment: Immature Platelet Fraction may be clinically indicated, consider ordering this additional test [id number] REPEATED TO VERIFY    nRBC 0.0 0.0 - 0.2 %    Comment: Performed at [facility], [place] [address], [place], [place] [address]  Sample to Blood Bank     Status: None   Collection Time: 07/31/22  4:55 PM  Result Value Ref Range   Blood Bank Specimen SAMPLE AVAILABLE FOR TESTING    Sample Expiration      08/03/2022,2359 Performed at [facility], [place] [address], [place], [place] [address]   Protime-INR     Status: Abnormal   Collection Time: 07/31/22  4:55 PM  Result Value Ref Range   Prothrombin Time 25.1 (H) 11.4 - 15.2 seconds   INR 2.3 (H) 0.8 - 1.2    Comment: (NOTE) INR goal varies based on device and disease  states. Performed at [facility], [place] [address], [place], [place] [address]     CT CHEST ABDOMEN PELVIS W CONTRAST  Result Date: 07/31/2022 CLINICAL DATA:  Polytrauma, blunt [id number] Trauma [phone] EXAM: CT CHEST, ABDOMEN, AND PELVIS WITH CONTRAST TECHNIQUE: Multidetector CT imaging of the chest, abdomen and pelvis was performed following the standard protocol during bolus administration of intravenous contrast. RADIATION DOSE REDUCTION: This exam was performed according to the departmental dose-optimization program which includes automated exposure control, adjustment of the mA and/or kV according to patient size and/or use of iterative reconstruction technique. CONTRAST:  52m OMNIPAQUE IOHEXOL 350 MG/ML SOLN COMPARISON:  CT abdomen pelvis 06/10/2018, CT abdomen pelvis 06/17/2019 FINDINGS: CHEST: Cardiovascular: No aortic injury. The ascending thoracic aorta is enlarged in caliber measuring up to 4.3 cm. The descending thoracic aorta is normal in caliber. Severe atherosclerotic plaque. Aortic valve leaflet calcification. At least 2 vessel coronary artery calcification. The heart is mildly enlarged in size. No significant pericardial effusion. The main pulmonary artery is enlarged in caliber measuring up to 3.5 cm. No main pulmonary embolus. Mediastinum/Nodes: No pneumomediastinum. No mediastinal hematoma. The esophagus is unremarkable. The thyroid is unremarkable. The central airways are patent. No mediastinal, hilar, or axillary lymphadenopathy. Lungs/Pleura: Diffuse bronchial wall thickening. No focal consolidation. Left apical 7 mm subsolid pulmonary nodule (5:5). Slightly more inferior 5 mm solid left upper lobe pulmonary nodule (5:53). Right middle lobe 5 mm ground-glass airspace opacity (5:101). No pulmonary mass. No pulmonary contusion or laceration. No pneumatocele formation. Low-density right pleural fluid which may represent a trace pleural effusion versus chylothorax.  No pneumothorax. No  hemothorax. Musculoskeletal/Chest wall: No chest wall mass. No acute rib or sternal fracture. Please see separately dictated CT thoracolumbar spine 07/31/2022. ABDOMEN / PELVIS: Hepatobiliary: Not enlarged. No focal lesion. No laceration or subcapsular hematoma. The gallbladder is otherwise unremarkable with no radio-opaque gallstones. No biliary ductal dilatation. Pancreas: Normal pancreatic contour. No main pancreatic duct dilatation. Spleen: Not enlarged. No focal lesion. No laceration, subcapsular hematoma, or vascular injury. Adrenals/Urinary Tract: No nodularity bilaterally. Nonspecific bilateral perinephric fat stranding. Bilateral kidneys enhance symmetrically. Interval increase in size of a right superior renal pole mass measuring at least 5 cm (from 3.7 cm) versus interval development of an adjacent new mass. Punctate left nephrolithiasis. No hydronephrosis. No contusion, laceration, or subcapsular hematoma. No injury to the vascular structures or collecting systems. No hydroureter. Suprapubic catheter terminates within in the collapsed urinary bladder lumen. Several calcified stones are noted within the gallbladder lumen measuring up to 8 mm. Stomach/Bowel: No small or large bowel wall thickening or dilatation. The appendix is not definitely identified with no inflammatory changes in the right lower quadrant to suggest acute appendicitis. Vasculature/Lymphatics: Severe atherosclerotic plaque. No abdominal aorta or iliac aneurysm. No active contrast extravasation or pseudoaneurysm. No abdominal, pelvic, inguinal lymphadenopathy. Reproductive: Prostate enlarged in size measuring up to 6.2 cm. Other: No simple free fluid ascites. No pneumoperitoneum. No hemoperitoneum. No mesenteric hematoma identified. No organized fluid collection. Musculoskeletal: No significant soft tissue hematoma. Age-indeterminate, possibly acute, displaced right S4 sacral ala fracture with extension to the right S4 and S5 sacral  foramina as well as extension to the left S3 sacral ala and left S3 sacral foramina (7:136, 8: 46). Please see separately dictated CT thoracolumbar spine 07/31/2022. No extension to the sacroiliac joints. No pelvic diastasis. No other acute displaced pelvic fracture. Ports and Devices: None. IMPRESSION: 1. Age-indeterminate, possibly acute, displaced bilateral sacral ala fractures extending to the right S4 and S5 as well as left S3 sacral foramina. 2. No acute intrathoracic, intra-abdominal, intrapelvic traumatic injury. 3. Please see separately dictated CT thoracolumbar spine as well as CT head and C-spine 07/31/2022. Other imaging findings of potential clinical significance: 1. Interval increase in size of a right superior renal pole mass measuring at least 5 cm (from 3.7 cm) versus interval development of an adjacent new mass. Findings suggestive of malignancy. When the patient is clinically stable and able to follow directions and hold their breath (preferably as an outpatient) further evaluation with dedicated MRI renal protocol should be considered. 2. 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 Recommend annual imaging followup by CTA or MRA. This recommendation follows 2010 ACCF/AHA/AATS/ACR/ASA/SCA/SCAI/SIR/STS/SVM Guidelines for the Diagnosis and Management of Patients with Thoracic Aortic Disease. Circulation. 2010; 121: J825-K539. Aortic aneurysm NOS (ICD10-I71.9) . 3. Mild cardiomegaly. 4. Enlarged main pulmonary artery suggestive of pulmonary hypertension. 5. Left apical 7 mm subsolid pulmonary nodule. Follow-up non-contrast CT recommended at 3-6 months to confirm persistence. If unchanged, and solid component remains <6 mm, annual CT is recommended until 5 years of stability has been established. If persistent these nodules should be considered highly suspicious if the solid component of the nodule is 6 mm or greater in size and enlarging. This recommendation follows the consensus statement:  Guidelines for Management of Incidental Pulmonary Nodules Detected on CT Images: From the Fleischner Society 2017; Radiology 2017; 284:228-243. 6. Low-density right pleural fluid which may represent a trace pleural effusion versus chylothorax. 7. Nonobstructive left nephrolithiasis. 8. Calcified stones within the urinary bladder lumen. Urinary bladder decompressed with suprapubic catheter  in appropriate position. 9. Prostatomegaly. 10. Aortic Atherosclerosis (ICD10-I70.0) including coronary artery calcification and aortic valve leaflet calcifications-correlate for aortic stenosis. Electronically Signed   By: Iven Finn M.D.   On: 07/31/2022 18:45   CT HEAD WO CONTRAST  Result Date: 07/31/2022 CLINICAL DATA:  Trauma EXAM: CT HEAD WITHOUT CONTRAST CT CERVICAL SPINE WITHOUT CONTRAST CT THORACIC SPINE WITHOUT CONTRAST CT LUMBAR SPINE WITHOUT CONTRAST TECHNIQUE: Multidetector CT imaging of the head and cervical, thoracic, and lumbar spine was performed following the standard protocol without intravenous contrast. Multiplanar CT image reconstructions of the cervical spine were also generated. RADIATION DOSE REDUCTION: This exam was performed according to the departmental dose-optimization program which includes automated exposure control, adjustment of the mA and/or kV according to patient size and/or use of iterative reconstruction technique. COMPARISON:  CT Head 05/01/19 FINDINGS: CT HEAD FINDINGS Brain: No evidence of acute infarction, hydrocephalus, or mass lesion/mass effect. There is a subtle hyperdensity along the right tentorial leaflet (series 5, image 49), which is new compared to 05/01/2019, and favored to represent a small amount of extra-axial blood products. There is advanced generalized volume loss. Vascular: No hyperdense vessel or unexpected calcification. Skull: Soft tissue swelling along the right frontal scalp in the left parietal scalp. No evidence of underlying calvarial fracture.  Sinuses/Orbits: Bilateral lens replacement. No evidence of mastoid or middle ear effusion. Paranasal sinuses are clear. Other: None CT CERVICAL, THORACIC, LUMBAR SPINE FINDINGS Alignment: Grade 1 anterolisthesis of C5 on C6.  Otherwise normal. Skull base and vertebrae: No evidence SP cervical spine fracture. Multilevel degenerative changes. With likely mild-to-moderate spinal canal stenosis at C3-C4. There has been posterior decompression at the T5-T8 level. No evidence of an acute thoracic spine fracture. There may be an acute fracture at the right L4 transverse process (series 2, image 54). There is an acute fracture through the S4 vertebral body. Soft tissues and spinal canal: See separately dictated CT chest abdomen and pelvis for soft tissue findings in the chest abdomen and pelvis. Disc levels: Limitations: Assessment of the spinal canal limited due to CT technique. Within this limitation Cervical spine: Likely mild-to-moderate spinal canal stenosis at C3-C4. Thoracic spine: No evidence of high-grade spinal canal stenosis. Lumbar spine: Congenitally short pedicles. Severe spinal canal stenosis from the L1-L2 level to the L4-L5 levels. There is also severe bilateral neural foraminal stenosis from the L3-L4 to the L5-S1 levels. Upper chest: See separately dictated CT chest abdomen and pelvis. IMPRESSION: CT HEAD: 1. Subtle hyperdensity along the right tentorial leaflet, favored to represent a small amount of extra-axial blood products. Recommend follow up CT to ensure stability. 2. Right frontal and left parietal scalp soft tissue swelling. No evidence of underlying calvarial fracture. CT CERVICAL SPINE: No acute cervical spine fracture. CT THORACIC SPINE: No acute thoracic spine fracture. CT LUMBAR SPINE: 1. Acute fracture through the S4 vertebral body. 2. Possible acute fracture of the right L4 transverse process. 3. Severe spinal canal stenosis from the L1-L2 level to the L4-L5 levels. Severe bilateral neural  foraminal stenosis from the L3-L4 to the L5-S1 levels. Electronically Signed   By: Marin Roberts M.D.   On: 07/31/2022 18:08   CT CERVICAL SPINE WO CONTRAST  Result Date: 07/31/2022 CLINICAL DATA:  Trauma EXAM: CT HEAD WITHOUT CONTRAST CT CERVICAL SPINE WITHOUT CONTRAST CT THORACIC SPINE WITHOUT CONTRAST CT LUMBAR SPINE WITHOUT CONTRAST TECHNIQUE: Multidetector CT imaging of the head and cervical, thoracic, and lumbar spine was performed following the standard protocol without intravenous contrast. Multiplanar CT image  reconstructions of the cervical spine were also generated. RADIATION DOSE REDUCTION: This exam was performed according to the departmental dose-optimization program which includes automated exposure control, adjustment of the mA and/or kV according to patient size and/or use of iterative reconstruction technique. COMPARISON:  CT Head 05/01/19 FINDINGS: CT HEAD FINDINGS Brain: No evidence of acute infarction, hydrocephalus, or mass lesion/mass effect. There is a subtle hyperdensity along the right tentorial leaflet (series 5, image 49), which is new compared to 05/01/2019, and favored to represent a small amount of extra-axial blood products. There is advanced generalized volume loss. Vascular: No hyperdense vessel or unexpected calcification. Skull: Soft tissue swelling along the right frontal scalp in the left parietal scalp. No evidence of underlying calvarial fracture. Sinuses/Orbits: Bilateral lens replacement. No evidence of mastoid or middle ear effusion. Paranasal sinuses are clear. Other: None CT CERVICAL, THORACIC, LUMBAR SPINE FINDINGS Alignment: Grade 1 anterolisthesis of C5 on C6.  Otherwise normal. Skull base and vertebrae: No evidence SP cervical spine fracture. Multilevel degenerative changes. With likely mild-to-moderate spinal canal stenosis at C3-C4. There has been posterior decompression at the T5-T8 level. No evidence of an acute thoracic spine fracture. There may be an acute  fracture at the right L4 transverse process (series 2, image 54). There is an acute fracture through the S4 vertebral body. Soft tissues and spinal canal: See separately dictated CT chest abdomen and pelvis for soft tissue findings in the chest abdomen and pelvis. Disc levels: Limitations: Assessment of the spinal canal limited due to CT technique. Within this limitation Cervical spine: Likely mild-to-moderate spinal canal stenosis at C3-C4. Thoracic spine: No evidence of high-grade spinal canal stenosis. Lumbar spine: Congenitally short pedicles. Severe spinal canal stenosis from the L1-L2 level to the L4-L5 levels. There is also severe bilateral neural foraminal stenosis from the L3-L4 to the L5-S1 levels. Upper chest: See separately dictated CT chest abdomen and pelvis. IMPRESSION: CT HEAD: 1. Subtle hyperdensity along the right tentorial leaflet, favored to represent a small amount of extra-axial blood products. Recommend follow up CT to ensure stability. 2. Right frontal and left parietal scalp soft tissue swelling. No evidence of underlying calvarial fracture. CT CERVICAL SPINE: No acute cervical spine fracture. CT THORACIC SPINE: No acute thoracic spine fracture. CT LUMBAR SPINE: 1. Acute fracture through the S4 vertebral body. 2. Possible acute fracture of the right L4 transverse process. 3. Severe spinal canal stenosis from the L1-L2 level to the L4-L5 levels. Severe bilateral neural foraminal stenosis from the L3-L4 to the L5-S1 levels. Electronically Signed   By: Marin Roberts M.D.   On: 07/31/2022 18:08   CT T-SPINE NO CHARGE  Result Date: 07/31/2022 CLINICAL DATA:  Trauma EXAM: CT HEAD WITHOUT CONTRAST CT CERVICAL SPINE WITHOUT CONTRAST CT THORACIC SPINE WITHOUT CONTRAST CT LUMBAR SPINE WITHOUT CONTRAST TECHNIQUE: Multidetector CT imaging of the head and cervical, thoracic, and lumbar spine was performed following the standard protocol without intravenous contrast. Multiplanar CT image  reconstructions of the cervical spine were also generated. RADIATION DOSE REDUCTION: This exam was performed according to the departmental dose-optimization program which includes automated exposure control, adjustment of the mA and/or kV according to patient size and/or use of iterative reconstruction technique. COMPARISON:  CT Head 05/01/19 FINDINGS: CT HEAD FINDINGS Brain: No evidence of acute infarction, hydrocephalus, or mass lesion/mass effect. There is a subtle hyperdensity along the right tentorial leaflet (series 5, image 49), which is new compared to 05/01/2019, and favored to represent a small amount of extra-axial blood products. There is  advanced generalized volume loss. Vascular: No hyperdense vessel or unexpected calcification. Skull: Soft tissue swelling along the right frontal scalp in the left parietal scalp. No evidence of underlying calvarial fracture. Sinuses/Orbits: Bilateral lens replacement. No evidence of mastoid or middle ear effusion. Paranasal sinuses are clear. Other: None CT CERVICAL, THORACIC, LUMBAR SPINE FINDINGS Alignment: Grade 1 anterolisthesis of C5 on C6.  Otherwise normal. Skull base and vertebrae: No evidence SP cervical spine fracture. Multilevel degenerative changes. With likely mild-to-moderate spinal canal stenosis at C3-C4. There has been posterior decompression at the T5-T8 level. No evidence of an acute thoracic spine fracture. There may be an acute fracture at the right L4 transverse process (series 2, image 54). There is an acute fracture through the S4 vertebral body. Soft tissues and spinal canal: See separately dictated CT chest abdomen and pelvis for soft tissue findings in the chest abdomen and pelvis. Disc levels: Limitations: Assessment of the spinal canal limited due to CT technique. Within this limitation Cervical spine: Likely mild-to-moderate spinal canal stenosis at C3-C4. Thoracic spine: No evidence of high-grade spinal canal stenosis. Lumbar spine:  Congenitally short pedicles. Severe spinal canal stenosis from the L1-L2 level to the L4-L5 levels. There is also severe bilateral neural foraminal stenosis from the L3-L4 to the L5-S1 levels. Upper chest: See separately dictated CT chest abdomen and pelvis. IMPRESSION: CT HEAD: 1. Subtle hyperdensity along the right tentorial leaflet, favored to represent a small amount of extra-axial blood products. Recommend follow up CT to ensure stability. 2. Right frontal and left parietal scalp soft tissue swelling. No evidence of underlying calvarial fracture. CT CERVICAL SPINE: No acute cervical spine fracture. CT THORACIC SPINE: No acute thoracic spine fracture. CT LUMBAR SPINE: 1. Acute fracture through the S4 vertebral body. 2. Possible acute fracture of the right L4 transverse process. 3. Severe spinal canal stenosis from the L1-L2 level to the L4-L5 levels. Severe bilateral neural foraminal stenosis from the L3-L4 to the L5-S1 levels. Electronically Signed   By: Marin Roberts M.D.   On: 07/31/2022 18:08   CT L-SPINE NO CHARGE  Result Date: 07/31/2022 CLINICAL DATA:  Trauma EXAM: CT HEAD WITHOUT CONTRAST CT CERVICAL SPINE WITHOUT CONTRAST CT THORACIC SPINE WITHOUT CONTRAST CT LUMBAR SPINE WITHOUT CONTRAST TECHNIQUE: Multidetector CT imaging of the head and cervical, thoracic, and lumbar spine was performed following the standard protocol without intravenous contrast. Multiplanar CT image reconstructions of the cervical spine were also generated. RADIATION DOSE REDUCTION: This exam was performed according to the departmental dose-optimization program which includes automated exposure control, adjustment of the mA and/or kV according to patient size and/or use of iterative reconstruction technique. COMPARISON:  CT Head 05/01/19 FINDINGS: CT HEAD FINDINGS Brain: No evidence of acute infarction, hydrocephalus, or mass lesion/mass effect. There is a subtle hyperdensity along the right tentorial leaflet (series 5, image  49), which is new compared to 05/01/2019, and favored to represent a small amount of extra-axial blood products. There is advanced generalized volume loss. Vascular: No hyperdense vessel or unexpected calcification. Skull: Soft tissue swelling along the right frontal scalp in the left parietal scalp. No evidence of underlying calvarial fracture. Sinuses/Orbits: Bilateral lens replacement. No evidence of mastoid or middle ear effusion. Paranasal sinuses are clear. Other: None CT CERVICAL, THORACIC, LUMBAR SPINE FINDINGS Alignment: Grade 1 anterolisthesis of C5 on C6.  Otherwise normal. Skull base and vertebrae: No evidence SP cervical spine fracture. Multilevel degenerative changes. With likely mild-to-moderate spinal canal stenosis at C3-C4. There has been posterior decompression at the T5-T8  level. No evidence of an acute thoracic spine fracture. There may be an acute fracture at the right L4 transverse process (series 2, image 54). There is an acute fracture through the S4 vertebral body. Soft tissues and spinal canal: See separately dictated CT chest abdomen and pelvis for soft tissue findings in the chest abdomen and pelvis. Disc levels: Limitations: Assessment of the spinal canal limited due to CT technique. Within this limitation Cervical spine: Likely mild-to-moderate spinal canal stenosis at C3-C4. Thoracic spine: No evidence of high-grade spinal canal stenosis. Lumbar spine: Congenitally short pedicles. Severe spinal canal stenosis from the L1-L2 level to the L4-L5 levels. There is also severe bilateral neural foraminal stenosis from the L3-L4 to the L5-S1 levels. Upper chest: See separately dictated CT chest abdomen and pelvis. IMPRESSION: CT HEAD: 1. Subtle hyperdensity along the right tentorial leaflet, favored to represent a small amount of extra-axial blood products. Recommend follow up CT to ensure stability. 2. Right frontal and left parietal scalp soft tissue swelling. No evidence of underlying  calvarial fracture. CT CERVICAL SPINE: No acute cervical spine fracture. CT THORACIC SPINE: No acute thoracic spine fracture. CT LUMBAR SPINE: 1. Acute fracture through the S4 vertebral body. 2. Possible acute fracture of the right L4 transverse process. 3. Severe spinal canal stenosis from the L1-L2 level to the L4-L5 levels. Severe bilateral neural foraminal stenosis from the L3-L4 to the L5-S1 levels. Electronically Signed   By: Marin Roberts M.D.   On: 07/31/2022 18:08   DG Knee 2 Views Right  Result Date: 07/31/2022 CLINICAL DATA:  Swelling, fall. EXAM: RIGHT KNEE - 1-2 VIEW COMPARISON:  Right knee x-ray 07/27/2022 FINDINGS: Small joint effusion persists. There is prepatellar soft tissue swelling similar to the prior study. There is no acute fracture or dislocation. There is mediolateral compartment degenerative change and chondrocalcinosis similar to prior. Peripheral vascular calcifications are present. IMPRESSION: 1. No acute fracture or dislocation. 2. Small joint effusion and prepatellar soft tissue swelling similar to prior study. Electronically Signed   By: Ronney Asters M.D.   On: 07/31/2022 17:15   DG Chest Port 1 View  Result Date: 07/31/2022 CLINICAL DATA:  Trauma, fall. EXAM: PORTABLE CHEST 1 VIEW COMPARISON:  Chest x-ray 07/27/2022 FINDINGS: Heart is mildly enlarged, unchanged. The lungs are clear. There is no pleural effusion or pneumothorax. No acute fractures are seen. IMPRESSION: No acute cardiopulmonary process. Electronically Signed   By: Ronney Asters M.D.   On: 07/31/2022 17:14   DG Pelvis Portable  Result Date: 07/31/2022 CLINICAL DATA:  Trauma EXAM: PORTABLE PELVIS 1-2 VIEWS COMPARISON:  Pelvis x-ray 07/27/2022 FINDINGS: There is no evidence of pelvic fracture or diastasis. No pelvic bone lesions are seen. There are moderate degenerative changes of both hips similar to the prior study. IMPRESSION: No acute bony abnormality. Moderate degenerative changes of both hips.  Electronically Signed   By: Ronney Asters M.D.   On: 07/31/2022 17:12    Review of Systems  Unable to perform ROS: Dementia  Constitutional:  Negative for chills and fever.  HENT:  Negative for ear discharge, hearing loss and sore throat.   Eyes:  Negative for discharge.  Respiratory:  Negative for cough and shortness of breath.   Cardiovascular:  Negative for chest pain and leg swelling.  Gastrointestinal:  Negative for abdominal pain, constipation, diarrhea, nausea and vomiting.  Musculoskeletal:  Negative for myalgias and neck pain.  Skin:  Negative for rash.  Allergic/Immunologic: Negative for environmental allergies.  Neurological:  Negative for dizziness  and seizures.  Hematological:  Does not bruise/bleed easily.  Psychiatric/Behavioral:  Negative for suicidal ideas.   All other systems reviewed and are negative.  Blood pressure (!) 160/82, pulse 80, temperature 97.9 F (36.6 C), temperature source Axillary, resp. rate (!) 25, height '6\' 7"'$  (2.007 m), weight 119 kg, SpO2 100 %. Physical Exam Vitals reviewed.  Constitutional:      General: He is not in acute distress.    Appearance: Normal appearance. He is well-developed. He is not diaphoretic.     Interventions: Cervical collar and nasal cannula in place.  HENT:     Head: Atraumatic. No raccoon eyes, Battle's sign, abrasion, contusion or laceration.     Comments: Stitches and abrasions to head    Right Ear: Hearing, tympanic membrane, ear canal and external ear normal. No laceration, drainage or tenderness. No foreign body. No hemotympanum. Tympanic membrane is not perforated.     Left Ear: Hearing, tympanic membrane, ear canal and external ear normal. No laceration, drainage or tenderness. No foreign body. No hemotympanum. Tympanic membrane is not perforated.     Nose: Nose normal. No nasal deformity or laceration.     Mouth/Throat:     Mouth: No lacerations.     Pharynx: Uvula midline.  Eyes:     General: Lids are normal.  No scleral icterus.    Conjunctiva/sclera: Conjunctivae normal.     Pupils: Pupils are equal, round, and reactive to light.  Neck:     Thyroid: No thyromegaly.     Vascular: No carotid bruit or JVD.     Trachea: Trachea normal.  Cardiovascular:     Rate and Rhythm: Normal rate and regular rhythm.     Pulses: Normal pulses.     Heart sounds: Normal heart sounds.  Pulmonary:     Effort: Pulmonary effort is normal. No respiratory distress.     Breath sounds: Normal breath sounds.  Chest:     Chest wall: No tenderness.  Abdominal:     General: Bowel sounds are decreased. There is no distension.     Palpations: Abdomen is soft. Abdomen is not rigid.     Tenderness: There is no abdominal tenderness. There is no guarding or rebound.  Musculoskeletal:        General: No tenderness. Normal range of motion.     Cervical back: No spinous process tenderness or muscular tenderness.       Legs:  Lymphadenopathy:     Cervical: No cervical adenopathy.  Skin:    General: Skin is warm and dry.  Neurological:     Mental Status: He is alert and oriented to person, place, and time.     GCS: GCS eye subscore is 4. GCS verbal subscore is 5. GCS motor subscore is 6.     Cranial Nerves: No cranial nerve deficit.     Sensory: No sensory deficit.  Psychiatric:        Speech: Speech normal.        Behavior: Behavior normal. Behavior is cooperative.     Assessment/Plan: 89 year old male status post fall Subarachnoid L4 TP fracture S4 fracture  History of dementia History of hypertension History of A-fib-on Coumadin History of chronic kidney disease  1.  Would recommend medical admission for observation due to multiple medical conditions. 2.  Follow-up with orthopedic and neurosurgery recommendations.  Will hold off on reversal of Coumadin for now. 3.  Discussed with the wife patient is DNR currently.  She would not like to have any  surgery done if needed.  High level MDM  Ralene Ok 07/31/2022, 8:29 PM

## 2022-07-31 NOTE — ED Notes (Addendum)
PT arrived via [facility] for cc of level 2 fall on thinners from [facility]. Unwitnessed fall, unknown down time, but known to have happened in the last few hours today. Pt reports slipping and falling while walking. Facility reports increase in falls, last one was 4 days ago resulting in hematoma to forehead, injury to right knee that remains swollen, tender, and red. Today pt is reporting back pain, new hematoma on back left of head. PMH of dementia, facility reports pt is at cognitive baseline. Ccollar in place.   BP 110/70 HR 60-70

## 2022-07-31 NOTE — ED Notes (Addendum)
Patient transported to CT with RN 

## 2022-07-31 NOTE — Progress Notes (Signed)
Orthopedic Tech Progress Note Patient Details:  Peter Singh December 06, 1931 [id number]  Level 2 trauma, ortho tech services have not been required at this time.  Patient ID: [name], male   DOB: [birth year], 89 y.o.   MRN: [id number]  Carin Primrose 07/31/2022, 5:43 PM

## 2022-07-31 NOTE — ED Notes (Signed)
Ccollar removed with clearance from Dr. Zenia Resides

## 2022-07-31 NOTE — ED Notes (Signed)
Pt returned from CT °

## 2022-08-01 ENCOUNTER — Observation Stay (HOSPITAL_COMMUNITY): Payer: Medicare Other

## 2022-08-01 DIAGNOSIS — S066X0A Traumatic subarachnoid hemorrhage without loss of consciousness, initial encounter: Secondary | ICD-10-CM | POA: Diagnosis not present

## 2022-08-01 DIAGNOSIS — S065XAA Traumatic subdural hemorrhage with loss of consciousness status unknown, initial encounter: Secondary | ICD-10-CM | POA: Diagnosis not present

## 2022-08-01 DIAGNOSIS — R531 Weakness: Secondary | ICD-10-CM | POA: Diagnosis not present

## 2022-08-01 DIAGNOSIS — S0636AA Traumatic hemorrhage of cerebrum, unspecified, with loss of consciousness status unknown, initial encounter: Secondary | ICD-10-CM | POA: Diagnosis not present

## 2022-08-01 DIAGNOSIS — W1830XA Fall on same level, unspecified, initial encounter: Secondary | ICD-10-CM | POA: Diagnosis not present

## 2022-08-01 DIAGNOSIS — I6523 Occlusion and stenosis of bilateral carotid arteries: Secondary | ICD-10-CM | POA: Diagnosis not present

## 2022-08-01 DIAGNOSIS — S32048A Other fracture of fourth lumbar vertebra, initial encounter for closed fracture: Secondary | ICD-10-CM | POA: Diagnosis not present

## 2022-08-01 DIAGNOSIS — I672 Cerebral atherosclerosis: Secondary | ICD-10-CM | POA: Diagnosis not present

## 2022-08-01 DIAGNOSIS — R404 Transient alteration of awareness: Secondary | ICD-10-CM | POA: Diagnosis not present

## 2022-08-01 DIAGNOSIS — Z7401 Bed confinement status: Secondary | ICD-10-CM | POA: Diagnosis not present

## 2022-08-01 DIAGNOSIS — S32009A Unspecified fracture of unspecified lumbar vertebra, initial encounter for closed fracture: Secondary | ICD-10-CM | POA: Diagnosis not present

## 2022-08-01 DIAGNOSIS — S066XAA Traumatic subarachnoid hemorrhage with loss of consciousness status unknown, initial encounter: Secondary | ICD-10-CM | POA: Diagnosis not present

## 2022-08-01 LAB — COMPREHENSIVE METABOLIC PANEL
ALT: 11 U/L (ref 0–44)
AST: 21 U/L (ref 15–41)
Albumin: 3.3 g/dL — ABNORMAL LOW (ref 3.5–5.0)
Alkaline Phosphatase: 80 U/L (ref 38–126)
Anion gap: 10 (ref 5–15)
BUN: 27 mg/dL — ABNORMAL HIGH (ref 8–23)
CO2: 22 mmol/L (ref 22–32)
Calcium: 9.8 mg/dL (ref 8.9–10.3)
Chloride: 106 mmol/L (ref 98–111)
Creatinine, Ser: 1.33 mg/dL — ABNORMAL HIGH (ref 0.61–1.24)
GFR, Estimated: 51 mL/min — ABNORMAL LOW (ref >60)
Glucose, Bld: 143 mg/dL — ABNORMAL HIGH (ref 70–99)
Potassium: 4.2 mmol/L (ref 3.5–5.1)
Sodium: 138 mmol/L (ref 135–145)
Total Bilirubin: 1.1 mg/dL (ref 0.3–1.2)
Total Protein: 6 g/dL — ABNORMAL LOW (ref 6.5–8.1)

## 2022-08-01 LAB — CBC
HCT: 30 % — ABNORMAL LOW (ref 39.0–52.0)
Hemoglobin: 9.2 g/dL — ABNORMAL LOW (ref 13.0–17.0)
MCH: 27.7 pg (ref 26.0–34.0)
MCHC: 30.7 g/dL (ref 30.0–36.0)
MCV: 90.4 fL (ref 80.0–100.0)
Platelets: 67 10*3/uL — ABNORMAL LOW (ref 150–400)
RBC: 3.32 MIL/uL — ABNORMAL LOW (ref 4.22–5.81)
RDW: 18.7 % — ABNORMAL HIGH (ref 11.5–15.5)
WBC: 3.9 10*3/uL — ABNORMAL LOW (ref 4.0–10.5)
nRBC: 0 % (ref 0.0–0.2)

## 2022-08-01 LAB — SAMPLE TO BLOOD BANK

## 2022-08-01 MED ORDER — OXYCODONE-ACETAMINOPHEN 5-325 MG PO TABS: 1.0000 | ORAL_TABLET | Freq: Three times a day (TID) | ORAL | 0 refills | Status: AC | PRN

## 2022-08-01 MED ORDER — HALOPERIDOL LACTATE 5 MG/ML IJ SOLN
5.0000 mg | Freq: Four times a day (QID) | INTRAMUSCULAR | Status: AC | PRN
  Administered 2022-08-01: 5 mg via INTRAVENOUS
  Filled 2022-08-01: qty 1

## 2022-08-01 MED ORDER — WARFARIN SODIUM 4 MG PO TABS: 4.0000 mg | ORAL_TABLET | Freq: Every day | ORAL | Status: DC

## 2022-08-01 MED ORDER — WARFARIN SODIUM 5 MG PO TABS: 5.0000 mg | ORAL_TABLET | Freq: Every day | ORAL | Status: DC

## 2022-08-01 NOTE — ED Notes (Signed)
Pt continues to attempt to sit up and get out of bed. Difficult to reorient, but not violent. Repeated attempts causing increased pain in area of fx. MD Jonelle Sidle paged again.

## 2022-08-01 NOTE — ED Notes (Signed)
Report given to Erin at [facility] being setup

## 2022-08-01 NOTE — Progress Notes (Signed)
[facility] night cross cover note:  I was notified by RN that this patient, who is here with fall, small ICH, S4 fx, lumbar fx, continues to attempt to get out of bed, with these behaviors refractory to attempts at verbal redirection.  In the setting of associated interference with ongoing medical treatment posing potential harm to themself, particularly given his established fall risk, I have placed order for prn IV haldol.     Babs Bertin, DO Hospitalist

## 2022-08-01 NOTE — Discharge Summary (Signed)
Physician Discharge Summary  Peter Singh BJS:[id number] DOB: 30-Nov-1931 DOA: 07/31/2022  PCP: [name], MD  Admit date: 07/31/2022 Discharge date: 08/01/2022  Admitted From: [facility] Discharge disposition: Back to the same facility  Recommendations at discharge:  Neurosurgery recommended to keep Coumadin on hold for next 2 weeks at least.  Please discuss with your cardiologist about long-term risk versus benefit of continue Coumadin. Continue pain medicines as needed   Brief narrative: Peter Singh is a 89 y.o. male with PMH significant for dementia, HTN, HLD, A fib on coumadin, multiple CVAs, PAD, CKD, history of renal cell cancer, BPH with chronic indwelling Foley catheter who lives at [facility], his wife lives in independent living level of the same facility.   1/25, patient was brought to the ED from [place] by EMS after an unwitnessed fall, unknown downtime.  Patient reportedly slipped and fell while walking.  EMS noted new hematoma on back and head and brought to the ED. Per report, lately, patient has been falling frequently, last fall was 4 days ago resulting in hematoma to forehead, right knee injury and swelling.  He was seen by her PCP.  X-ray was negative for any fracture, dislocation but showed joint effusion.  He was started on a 5-day course of prednisone, scheduled Tylenol and conservative measures for pain control  In the ED, hemodynamically stable, breathing on room air Labs with glucose elevated to 170s, creatinine elevated to 1.57, WC count 3.1, hemoglobin 11.2, platelet low at 73, INR therapeutic at 2.3 Urinalysis with hazy yellow urine with positive nitrite, small leukocytes, many bacteria Skeletal survey done.. CT head showed subtle hyperdensity along the right tentorial leaflet, favored to represent a small amount of extra-axial blood products. Recommend follow up CT to ensure stability. CT cervical and thoracic  spine did not show any acute fracture. CT lumbar spine showed acute fracture to the S4 vertebral body, possible acute fracture of the right L4 transverse process. Severe spinal canal stenosis from the L1-L2 level to the L4-L5 levels. Severe bilateral neural foraminal stenosis from the L3-L4 to the L5-S1 levels.  EDP spoke with neurosurgery, did not recommend INR reversal Patient was seen by trauma surgery as well.  No surgical intervention was required Admitted to [facility] CT scan of head repeated this morning showed stability of the small volume subarachnoid hemorrhage overlying the right temporal lobe.  Subjective: Patient was seen and examined this morning.  Pleasant elderly Caucasian male lying down in bed.  Pleasantly demented.  Not in pain at this time.  His wife is at bedside. We discussed about the repeat CT scan this morning.  He has also been seen by neurosurgery, orthopedic surgery and trauma surgery. No surgical indication at this time. I offered him and his wife and evaluation by physical therapy and titration of pain medicines.  Patient however is not in any significant pain at this time.  His wife states that he already is getting physical therapy at the facility.  She would prefer discharge back to facility today if possible.  Chart reviewed.  Remains hemodynamically stable. Labs this morning with improvement in creatinine  Assessment and plan: Subarachnoid hemorrhage Secondary to fall while on Coumadin Imagings as above.  Repeat CT this morning showed stable small volume SAH overlying right temporal lobe. Seen by neurosurgery. No surgical intervention required. Recommended to keep Coumadin on hold for next 2 weeks at least.  Acute fracture of the S4 vertebral body Acute fracture of the  right L4 transverse process Seen by orthopedics as well as neurosurgery. No surgical intervention recommended at this time. Continue as needed pain medicines.  Continue physical therapy at the  facility.  Frequent falls Severe spinal stenosis CT lumbar spine noted severe spinal canal stenosis from the L1-L2 level to the L4-L5 levels. Severe bilateral neural foraminal stenosis from the L3-L4 to the L5-S1 levels. Likely because of gait impairment and falls. No surgical intervention at this time.  Abnormal urinalysis  Associated with chronic indwelling Foley catheter Urinalysis with hazy yellow urine and positive nitrate, many bacteria Patient does not report any urinary symptoms at this time.  No need of antibiotics.  CKD 3B It seems for the past 6 months at least, patient's creatinine has been running close to 1.5.  Chronically on Lasix for CHF.  Creatinine stable at this time. Recent Labs    12/20/21 0000 12/24/21 0000 01/21/22 0000 02/13/22 2014 04/24/22 0000 05/05/22 0000 06/09/22 0815 07/27/22 1503 07/31/22 1655 08/01/22 0300  BUN 31* 20 20 22* 25* 27* 26* 26* 30*  33* 27*  CREATININE 1.2 1.2 1.3 1.4* 1.4* 1.7* 1.5* 1.54* 1.57*  1.60* 1.33*   Vascular dementia Pleasantly demented.  No agitatio or behavioral symptoms Continue supportive care Continue Cymbalta 20 mg daily  Chronic A-fib PTA, patient is not on any AV nodal blocking agent. Chronically anticoagulated with Coumadin.  INR therapeutic.  Reversal not recommended by neurosurgery. Per neurosurgery recommendation, keep Coumadin on hold for at least 2 weeks. Patient's wife states that he has been on Coumadin for last several years under the supervision of cardiologist.  Given patient's advanced age, recurrent fall and intracranial hemorrhage, I would believe that risk of life-threatening bleeding is higher than the benefit from Coumadin.  It would probably a better idea to not resume Coumadin.  I would recommend to discuss with your cardiologist about long-term risk versus benefit of continue Coumadin. Recent Labs  Lab 07/27/22 1503 07/31/22 1655  INR 2.8* 2.3*   Multiple CVAs PAD, HLD Continue  statin.  Plan for Coumadin as above.  Chronic diastolic CHF Essential hypertension PTA on Lasix 40 mg daily as well as PRN.  Continue same  Pancytopenia All cell lines depressed.  But stable.  Continue to monitor. Recent Labs  Lab 07/27/22 1503 07/31/22 1655 08/01/22 0300  WBC 2.5* 3.1* 3.9*  HGB 11.3* 10.6*  11.2* 9.2*  HCT 38.9* 34.9*  33.0* 30.0*  MCV 94.2 91.8 90.4  PLT 74* 73* 67*    Wounds:  -    Discharge Exam:   Vitals:   08/01/22 0600 08/01/22 0700 08/01/22 0740 08/01/22 1000  BP: (!) 153/81 134/73  126/80  Pulse: (!) 41 76  77  Resp: (!) 24 (!) 27  17  Temp:   (!) 97.5 F (36.4 C)   TempSrc:   Oral   SpO2: 100% 93%  100%  Weight:      Height:        Body mass index is 29.55 kg/m.  General exam: Pleasant, elderly Caucasian male.  Lying on bed.  Not in pain Skin: No rashes, lesions or ulcers. HEENT: Atraumatic, normocephalic, no obvious bleeding Lungs: Clear to auscultation bilaterally CVS: Regular rate and rhythm, no murmur GI/Abd soft, nontender, nondistended, bowel sound present CNS: Alert, awake, oriented to place only demented at baseline Psychiatry: Cheerfully demented.  No behavioral symptoms Extremities: Mild bruising on both legs, mild bilateral pedal edema.  No calf tenderness  Follow ups:    Follow-up Information  [name], MD Follow up.   Specialty: Internal Medicine Contact information: [place] [phone]                 Discharge Instructions:   Discharge Instructions     Call MD for:  difficulty breathing, headache or visual disturbances   Complete by: As directed    Call MD for:  extreme fatigue   Complete by: As directed    Call MD for:  hives   Complete by: As directed    Call MD for:  persistant dizziness or light-headedness   Complete by: As directed    Call MD for:  persistant nausea and vomiting   Complete by: As directed    Call MD for:  severe uncontrolled  pain   Complete by: As directed    Call MD for:  temperature >100.4   Complete by: As directed    Diet general   Complete by: As directed    Discharge instructions   Complete by: As directed    Recommendations at discharge:   Neurosurgery recommended to keep Coumadin on hold for next 2 weeks at least.  Please discuss with your cardiologist about long-term risk versus benefit of continue Coumadin.  Continue pain medicines as needed  General discharge instructions: Follow with Primary MD [name], MD in 7 days  Please request your PCP  to go over your hospital tests, procedures, radiology results at the follow up. Please get your medicines reviewed and adjusted.  Your PCP may decide to repeat certain labs or tests as needed. Do not drive, operate heavy machinery, perform activities at heights, swimming or participation in water activities or provide baby sitting services if your were admitted for syncope or siezures until you have seen by Primary MD or a Neurologist and advised to do so again. [place] Controlled Substance Reporting System database was reviewed. Do not drive, operate heavy machinery, perform activities at heights, swim, participate in water activities or provide baby-sitting services while on medications for pain, sleep and mood until your outpatient physician has reevaluated you and advised to do so again.  You are strongly recommended to comply with the dose, frequency and duration of prescribed medications. Activity: As tolerated with Full fall precautions use walker/cane & assistance as needed Avoid using any recreational substances like cigarette, tobacco, alcohol, or non-prescribed drug. If you experience worsening of your admission symptoms, develop shortness of breath, life threatening emergency, suicidal or homicidal thoughts you must seek medical attention immediately by calling 911 or calling your MD immediately  if symptoms less severe. You must read  complete instructions/literature along with all the possible adverse reactions/side effects for all the medicines you take and that have been prescribed to you. Take any new medicine only after you have completely understood and accepted all the possible adverse reactions/side effects.  Wear Seat belts while driving. You were cared for by a hospitalist during your hospital stay. If you have any questions about your discharge medications or the care you received while you were in the hospital after you are discharged, you can call the unit and ask to speak with the hospitalist or the covering physician. Once you are discharged, your primary care physician will handle any further medical issues. Please note that NO REFILLS for any discharge medications will be authorized once you are discharged, as it is imperative that you return to your primary care physician (or establish a relationship with a primary care  physician if you do not have one).   Increase activity slowly   Complete by: As directed        Discharge Medications:   Allergies as of 08/01/2022       Reactions   Penicillins Rash   Has patient had a PCN reaction causing immediate rash, facial/tongue/throat swelling, SOB or lightheadedness with hypotension: unknown Has patient had a PCN reaction causing severe rash involving mucus membranes or skin necrosis: unknown Has patient had a PCN reaction that required hospitalization : unknown Has patient had a PCN reaction occurring within the last 10 years: no If all of the above answers are "NO", then may proceed with Cephalosporin use.        Medication List     TAKE these medications    acetaminophen 500 MG tablet Commonly known as: TYLENOL Take 1,000 mg by mouth 2 (two) times daily.   cholecalciferol 25 MCG (1000 UNIT) tablet Commonly known as: VITAMIN D3 Take 2,000 Units by mouth daily.   Cranberry 400 MG Caps Take 400 mg by mouth every morning. 400 mg, oral, Once a  morning   DULoxetine 20 MG capsule Commonly known as: CYMBALTA Take 20 mg by mouth daily.   furosemide 20 MG tablet Commonly known as: LASIX Take 2 tablets (40 mg total) by mouth daily. Take extra '20mg'$  for weight gain of 2-3lbs in 1 day or 5 lbs in 1 week   gabapentin 100 MG capsule Commonly known as: NEURONTIN Take 200 mg by mouth at bedtime.   ketoconazole 2 % shampoo Commonly known as: NIZORAL Apply 1 Application topically 2 (two) times a week.   oxyCODONE-acetaminophen 5-325 MG tablet Commonly known as: Percocet Take 1 tablet by mouth every 8 (eight) hours as needed for up to 3 days for severe pain.   polyethylene glycol 17 g packet Commonly known as: MIRALAX / GLYCOLAX Take 17 g by mouth daily.   potassium chloride 10 MEQ CR capsule Commonly known as: MICRO-K Take 10 mEq by mouth every Tuesday, Thursday, and Saturday at 6 PM. Every Tues., Thurs., Sat. 7:00 am - 3:00 pm, per [facility].   predniSONE 20 MG tablet Commonly known as: DELTASONE Take 20 mg by mouth daily.   triamcinolone lotion 0.1 % Commonly known as: KENALOG Apply 1 application. topically 2 (two) times daily as needed (rash on back).   warfarin 4 MG tablet Commonly known as: COUMADIN Take 1 tablet (4 mg total) by mouth daily. Once a day on Mon, Wed, Fri, Sat 4:00 pm - 7:00 pm Start taking on: August 16, 2022 What changed: These instructions start on August 16, 2022. If you are unsure what to do until then, ask your doctor or other care provider.   warfarin 5 MG tablet Commonly known as: COUMADIN Take 1 tablet (5 mg total) by mouth daily. Give PO every Sun, Tues, Thurs 4:00 pm - 7:00 pm Start taking on: August 16, 2022 What changed: These instructions start on August 16, 2022. If you are unsure what to do until then, ask your doctor or other care provider.         The results of significant diagnostics from this hospitalization (including imaging, microbiology, ancillary and  laboratory) are listed below for reference.    Procedures and Diagnostic Studies:   CT HEAD WO CONTRAST (5MM)  Result Date: 08/01/2022 CLINICAL DATA:  Known hemorrhage EXAM: CT HEAD WITHOUT CONTRAST TECHNIQUE: Contiguous axial images were obtained from the base of the skull through the vertex  without intravenous contrast. RADIATION DOSE REDUCTION: This exam was performed according to the departmental dose-optimization program which includes automated exposure control, adjustment of the mA and/or kV according to patient size and/or use of iterative reconstruction technique. COMPARISON:  CT head 1 day prior FINDINGS: Brain: Small volume subarachnoid hemorrhage along the underside of the right temporal lobe is unchanged (5-43). There is no edema or mass effect. There is no other acute intracranial hemorrhage or extra-axial fluid collection. There is no evidence of acute infarct. Parenchymal volume is stable. The ventricles are stable in size. Gray-white differentiation is preserved. Pituitary and suprasellar region are normal. There is no mass lesion or midline shift. Vascular: There is calcification of the bilateral carotid siphons and vertebral arteries. Skull: Normal. Negative for fracture or focal lesion. Sinuses/Orbits: The paranasal sinuses are clear. Bilateral lens implants are in place. The globes and orbits are otherwise unremarkable. Other: Right frontal and left parietal scalp swelling is again seen. IMPRESSION: Stable small volume subarachnoid hemorrhage overlying the right temporal lobe. No new acute intracranial pathology. Electronically Signed   By: Valetta Mole M.D.   On: 08/01/2022 08:07   CT CHEST ABDOMEN PELVIS W CONTRAST  Result Date: 07/31/2022 CLINICAL DATA:  Polytrauma, blunt [id number] Trauma [id number] EXAM: CT CHEST, ABDOMEN, AND PELVIS WITH CONTRAST TECHNIQUE: Multidetector CT imaging of the chest, abdomen and pelvis was performed following the standard protocol during bolus administration  of intravenous contrast. RADIATION DOSE REDUCTION: This exam was performed according to the departmental dose-optimization program which includes automated exposure control, adjustment of the mA and/or kV according to patient size and/or use of iterative reconstruction technique. CONTRAST:  82m OMNIPAQUE IOHEXOL 350 MG/ML SOLN COMPARISON:  CT abdomen pelvis 06/10/2018, CT abdomen pelvis 06/17/2019 FINDINGS: CHEST: Cardiovascular: No aortic injury. The ascending thoracic aorta is enlarged in caliber measuring up to 4.3 cm. The descending thoracic aorta is normal in caliber. Severe atherosclerotic plaque. Aortic valve leaflet calcification. At least 2 vessel coronary artery calcification. The heart is mildly enlarged in size. No significant pericardial effusion. The main pulmonary artery is enlarged in caliber measuring up to 3.5 cm. No main pulmonary embolus. Mediastinum/Nodes: No pneumomediastinum. No mediastinal hematoma. The esophagus is unremarkable. The thyroid is unremarkable. The central airways are patent. No mediastinal, hilar, or axillary lymphadenopathy. Lungs/Pleura: Diffuse bronchial wall thickening. No focal consolidation. Left apical 7 mm subsolid pulmonary nodule (5:5). Slightly more inferior 5 mm solid left upper lobe pulmonary nodule (5:53). Right middle lobe 5 mm ground-glass airspace opacity (5:101). No pulmonary mass. No pulmonary contusion or laceration. No pneumatocele formation. Low-density right pleural fluid which may represent a trace pleural effusion versus chylothorax. No pneumothorax. No hemothorax. Musculoskeletal/Chest wall: No chest wall mass. No acute rib or sternal fracture. Please see separately dictated CT thoracolumbar spine 07/31/2022. ABDOMEN / PELVIS: Hepatobiliary: Not enlarged. No focal lesion. No laceration or subcapsular hematoma. The gallbladder is otherwise unremarkable with no radio-opaque gallstones. No biliary ductal dilatation. Pancreas: Normal pancreatic contour.  No main pancreatic duct dilatation. Spleen: Not enlarged. No focal lesion. No laceration, subcapsular hematoma, or vascular injury. Adrenals/Urinary Tract: No nodularity bilaterally. Nonspecific bilateral perinephric fat stranding. Bilateral kidneys enhance symmetrically. Interval increase in size of a right superior renal pole mass measuring at least 5 cm (from 3.7 cm) versus interval development of an adjacent new mass. Punctate left nephrolithiasis. No hydronephrosis. No contusion, laceration, or subcapsular hematoma. No injury to the vascular structures or collecting systems. No hydroureter. Suprapubic catheter terminates within in the collapsed urinary bladder lumen. Several  calcified stones are noted within the gallbladder lumen measuring up to 8 mm. Stomach/Bowel: No small or large bowel wall thickening or dilatation. The appendix is not definitely identified with no inflammatory changes in the right lower quadrant to suggest acute appendicitis. Vasculature/Lymphatics: Severe atherosclerotic plaque. No abdominal aorta or iliac aneurysm. No active contrast extravasation or pseudoaneurysm. No abdominal, pelvic, inguinal lymphadenopathy. Reproductive: Prostate enlarged in size measuring up to 6.2 cm. Other: No simple free fluid ascites. No pneumoperitoneum. No hemoperitoneum. No mesenteric hematoma identified. No organized fluid collection. Musculoskeletal: No significant soft tissue hematoma. Age-indeterminate, possibly acute, displaced right S4 sacral ala fracture with extension to the right S4 and S5 sacral foramina as well as extension to the left S3 sacral ala and left S3 sacral foramina (7:136, 8: 46). Please see separately dictated CT thoracolumbar spine 07/31/2022. No extension to the sacroiliac joints. No pelvic diastasis. No other acute displaced pelvic fracture. Ports and Devices: None. IMPRESSION: 1. Age-indeterminate, possibly acute, displaced bilateral sacral ala fractures extending to the right S4  and S5 as well as left S3 sacral foramina. 2. No acute intrathoracic, intra-abdominal, intrapelvic traumatic injury. 3. Please see separately dictated CT thoracolumbar spine as well as CT head and C-spine 07/31/2022. Other imaging findings of potential clinical significance: 1. Interval increase in size of a right superior renal pole mass measuring at least 5 cm (from 3.7 cm) versus interval development of an adjacent new mass. Findings suggestive of malignancy. When the patient is clinically stable and able to follow directions and hold their breath (preferably as an outpatient) further evaluation with dedicated MRI renal protocol should be considered. 2. 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 Recommend annual imaging followup by CTA or MRA. This recommendation follows 2010 ACCF/AHA/AATS/ACR/ASA/SCA/SCAI/SIR/STS/SVM Guidelines for the Diagnosis and Management of Patients with Thoracic Aortic Disease. Circulation. 2010; 121: T903-E092. Aortic aneurysm NOS (ICD10-I71.9) . 3. Mild cardiomegaly. 4. Enlarged main pulmonary artery suggestive of pulmonary hypertension. 5. Left apical 7 mm subsolid pulmonary nodule. Follow-up non-contrast CT recommended at 3-6 months to confirm persistence. If unchanged, and solid component remains <6 mm, annual CT is recommended until 5 years of stability has been established. If persistent these nodules should be considered highly suspicious if the solid component of the nodule is 6 mm or greater in size and enlarging. This recommendation follows the consensus statement: Guidelines for Management of Incidental Pulmonary Nodules Detected on CT Images: From the Fleischner Society 2017; Radiology 2017; 284:228-243. 6. Low-density right pleural fluid which may represent a trace pleural effusion versus chylothorax. 7. Nonobstructive left nephrolithiasis. 8. Calcified stones within the urinary bladder lumen. Urinary bladder decompressed with suprapubic catheter in appropriate position.  9. Prostatomegaly. 10. Aortic Atherosclerosis (ICD10-I70.0) including coronary artery calcification and aortic valve leaflet calcifications-correlate for aortic stenosis. Electronically Signed   By: Iven Finn M.D.   On: 07/31/2022 18:45   CT HEAD WO CONTRAST  Result Date: 07/31/2022 CLINICAL DATA:  Trauma EXAM: CT HEAD WITHOUT CONTRAST CT CERVICAL SPINE WITHOUT CONTRAST CT THORACIC SPINE WITHOUT CONTRAST CT LUMBAR SPINE WITHOUT CONTRAST TECHNIQUE: Multidetector CT imaging of the head and cervical, thoracic, and lumbar spine was performed following the standard protocol without intravenous contrast. Multiplanar CT image reconstructions of the cervical spine were also generated. RADIATION DOSE REDUCTION: This exam was performed according to the departmental dose-optimization program which includes automated exposure control, adjustment of the mA and/or kV according to patient size and/or use of iterative reconstruction technique. COMPARISON:  CT Head 05/01/19 FINDINGS: CT HEAD FINDINGS Brain: No evidence  of acute infarction, hydrocephalus, or mass lesion/mass effect. There is a subtle hyperdensity along the right tentorial leaflet (series 5, image 49), which is new compared to 05/01/2019, and favored to represent a small amount of extra-axial blood products. There is advanced generalized volume loss. Vascular: No hyperdense vessel or unexpected calcification. Skull: Soft tissue swelling along the right frontal scalp in the left parietal scalp. No evidence of underlying calvarial fracture. Sinuses/Orbits: Bilateral lens replacement. No evidence of mastoid or middle ear effusion. Paranasal sinuses are clear. Other: None CT CERVICAL, THORACIC, LUMBAR SPINE FINDINGS Alignment: Grade 1 anterolisthesis of C5 on C6.  Otherwise normal. Skull base and vertebrae: No evidence SP cervical spine fracture. Multilevel degenerative changes. With likely mild-to-moderate spinal canal stenosis at C3-C4. There has been  posterior decompression at the T5-T8 level. No evidence of an acute thoracic spine fracture. There may be an acute fracture at the right L4 transverse process (series 2, image 54). There is an acute fracture through the S4 vertebral body. Soft tissues and spinal canal: See separately dictated CT chest abdomen and pelvis for soft tissue findings in the chest abdomen and pelvis. Disc levels: Limitations: Assessment of the spinal canal limited due to CT technique. Within this limitation Cervical spine: Likely mild-to-moderate spinal canal stenosis at C3-C4. Thoracic spine: No evidence of high-grade spinal canal stenosis. Lumbar spine: Congenitally short pedicles. Severe spinal canal stenosis from the L1-L2 level to the L4-L5 levels. There is also severe bilateral neural foraminal stenosis from the L3-L4 to the L5-S1 levels. Upper chest: See separately dictated CT chest abdomen and pelvis. IMPRESSION: CT HEAD: 1. Subtle hyperdensity along the right tentorial leaflet, favored to represent a small amount of extra-axial blood products. Recommend follow up CT to ensure stability. 2. Right frontal and left parietal scalp soft tissue swelling. No evidence of underlying calvarial fracture. CT CERVICAL SPINE: No acute cervical spine fracture. CT THORACIC SPINE: No acute thoracic spine fracture. CT LUMBAR SPINE: 1. Acute fracture through the S4 vertebral body. 2. Possible acute fracture of the right L4 transverse process. 3. Severe spinal canal stenosis from the L1-L2 level to the L4-L5 levels. Severe bilateral neural foraminal stenosis from the L3-L4 to the L5-S1 levels. Electronically Signed   By: Marin Roberts M.D.   On: 07/31/2022 18:08   CT CERVICAL SPINE WO CONTRAST  Result Date: 07/31/2022 CLINICAL DATA:  Trauma EXAM: CT HEAD WITHOUT CONTRAST CT CERVICAL SPINE WITHOUT CONTRAST CT THORACIC SPINE WITHOUT CONTRAST CT LUMBAR SPINE WITHOUT CONTRAST TECHNIQUE: Multidetector CT imaging of the head and cervical, thoracic, and  lumbar spine was performed following the standard protocol without intravenous contrast. Multiplanar CT image reconstructions of the cervical spine were also generated. RADIATION DOSE REDUCTION: This exam was performed according to the departmental dose-optimization program which includes automated exposure control, adjustment of the mA and/or kV according to patient size and/or use of iterative reconstruction technique. COMPARISON:  CT Head 05/01/19 FINDINGS: CT HEAD FINDINGS Brain: No evidence of acute infarction, hydrocephalus, or mass lesion/mass effect. There is a subtle hyperdensity along the right tentorial leaflet (series 5, image 49), which is new compared to 05/01/2019, and favored to represent a small amount of extra-axial blood products. There is advanced generalized volume loss. Vascular: No hyperdense vessel or unexpected calcification. Skull: Soft tissue swelling along the right frontal scalp in the left parietal scalp. No evidence of underlying calvarial fracture. Sinuses/Orbits: Bilateral lens replacement. No evidence of mastoid or middle ear effusion. Paranasal sinuses are clear. Other: None CT CERVICAL, THORACIC, LUMBAR  SPINE FINDINGS Alignment: Grade 1 anterolisthesis of C5 on C6.  Otherwise normal. Skull base and vertebrae: No evidence SP cervical spine fracture. Multilevel degenerative changes. With likely mild-to-moderate spinal canal stenosis at C3-C4. There has been posterior decompression at the T5-T8 level. No evidence of an acute thoracic spine fracture. There may be an acute fracture at the right L4 transverse process (series 2, image 54). There is an acute fracture through the S4 vertebral body. Soft tissues and spinal canal: See separately dictated CT chest abdomen and pelvis for soft tissue findings in the chest abdomen and pelvis. Disc levels: Limitations: Assessment of the spinal canal limited due to CT technique. Within this limitation Cervical spine: Likely mild-to-moderate spinal  canal stenosis at C3-C4. Thoracic spine: No evidence of high-grade spinal canal stenosis. Lumbar spine: Congenitally short pedicles. Severe spinal canal stenosis from the L1-L2 level to the L4-L5 levels. There is also severe bilateral neural foraminal stenosis from the L3-L4 to the L5-S1 levels. Upper chest: See separately dictated CT chest abdomen and pelvis. IMPRESSION: CT HEAD: 1. Subtle hyperdensity along the right tentorial leaflet, favored to represent a small amount of extra-axial blood products. Recommend follow up CT to ensure stability. 2. Right frontal and left parietal scalp soft tissue swelling. No evidence of underlying calvarial fracture. CT CERVICAL SPINE: No acute cervical spine fracture. CT THORACIC SPINE: No acute thoracic spine fracture. CT LUMBAR SPINE: 1. Acute fracture through the S4 vertebral body. 2. Possible acute fracture of the right L4 transverse process. 3. Severe spinal canal stenosis from the L1-L2 level to the L4-L5 levels. Severe bilateral neural foraminal stenosis from the L3-L4 to the L5-S1 levels. Electronically Signed   By: Marin Roberts M.D.   On: 07/31/2022 18:08   CT T-SPINE NO CHARGE  Result Date: 07/31/2022 CLINICAL DATA:  Trauma EXAM: CT HEAD WITHOUT CONTRAST CT CERVICAL SPINE WITHOUT CONTRAST CT THORACIC SPINE WITHOUT CONTRAST CT LUMBAR SPINE WITHOUT CONTRAST TECHNIQUE: Multidetector CT imaging of the head and cervical, thoracic, and lumbar spine was performed following the standard protocol without intravenous contrast. Multiplanar CT image reconstructions of the cervical spine were also generated. RADIATION DOSE REDUCTION: This exam was performed according to the departmental dose-optimization program which includes automated exposure control, adjustment of the mA and/or kV according to patient size and/or use of iterative reconstruction technique. COMPARISON:  CT Head 05/01/19 FINDINGS: CT HEAD FINDINGS Brain: No evidence of acute infarction, hydrocephalus, or mass  lesion/mass effect. There is a subtle hyperdensity along the right tentorial leaflet (series 5, image 49), which is new compared to 05/01/2019, and favored to represent a small amount of extra-axial blood products. There is advanced generalized volume loss. Vascular: No hyperdense vessel or unexpected calcification. Skull: Soft tissue swelling along the right frontal scalp in the left parietal scalp. No evidence of underlying calvarial fracture. Sinuses/Orbits: Bilateral lens replacement. No evidence of mastoid or middle ear effusion. Paranasal sinuses are clear. Other: None CT CERVICAL, THORACIC, LUMBAR SPINE FINDINGS Alignment: Grade 1 anterolisthesis of C5 on C6.  Otherwise normal. Skull base and vertebrae: No evidence SP cervical spine fracture. Multilevel degenerative changes. With likely mild-to-moderate spinal canal stenosis at C3-C4. There has been posterior decompression at the T5-T8 level. No evidence of an acute thoracic spine fracture. There may be an acute fracture at the right L4 transverse process (series 2, image 54). There is an acute fracture through the S4 vertebral body. Soft tissues and spinal canal: See separately dictated CT chest abdomen and pelvis for soft tissue findings in  the chest abdomen and pelvis. Disc levels: Limitations: Assessment of the spinal canal limited due to CT technique. Within this limitation Cervical spine: Likely mild-to-moderate spinal canal stenosis at C3-C4. Thoracic spine: No evidence of high-grade spinal canal stenosis. Lumbar spine: Congenitally short pedicles. Severe spinal canal stenosis from the L1-L2 level to the L4-L5 levels. There is also severe bilateral neural foraminal stenosis from the L3-L4 to the L5-S1 levels. Upper chest: See separately dictated CT chest abdomen and pelvis. IMPRESSION: CT HEAD: 1. Subtle hyperdensity along the right tentorial leaflet, favored to represent a small amount of extra-axial blood products. Recommend follow up CT to ensure  stability. 2. Right frontal and left parietal scalp soft tissue swelling. No evidence of underlying calvarial fracture. CT CERVICAL SPINE: No acute cervical spine fracture. CT THORACIC SPINE: No acute thoracic spine fracture. CT LUMBAR SPINE: 1. Acute fracture through the S4 vertebral body. 2. Possible acute fracture of the right L4 transverse process. 3. Severe spinal canal stenosis from the L1-L2 level to the L4-L5 levels. Severe bilateral neural foraminal stenosis from the L3-L4 to the L5-S1 levels. Electronically Signed   By: Marin Roberts M.D.   On: 07/31/2022 18:08   CT L-SPINE NO CHARGE  Result Date: 07/31/2022 CLINICAL DATA:  Trauma EXAM: CT HEAD WITHOUT CONTRAST CT CERVICAL SPINE WITHOUT CONTRAST CT THORACIC SPINE WITHOUT CONTRAST CT LUMBAR SPINE WITHOUT CONTRAST TECHNIQUE: Multidetector CT imaging of the head and cervical, thoracic, and lumbar spine was performed following the standard protocol without intravenous contrast. Multiplanar CT image reconstructions of the cervical spine were also generated. RADIATION DOSE REDUCTION: This exam was performed according to the departmental dose-optimization program which includes automated exposure control, adjustment of the mA and/or kV according to patient size and/or use of iterative reconstruction technique. COMPARISON:  CT Head 05/01/19 FINDINGS: CT HEAD FINDINGS Brain: No evidence of acute infarction, hydrocephalus, or mass lesion/mass effect. There is a subtle hyperdensity along the right tentorial leaflet (series 5, image 49), which is new compared to 05/01/2019, and favored to represent a small amount of extra-axial blood products. There is advanced generalized volume loss. Vascular: No hyperdense vessel or unexpected calcification. Skull: Soft tissue swelling along the right frontal scalp in the left parietal scalp. No evidence of underlying calvarial fracture. Sinuses/Orbits: Bilateral lens replacement. No evidence of mastoid or middle ear effusion.  Paranasal sinuses are clear. Other: None CT CERVICAL, THORACIC, LUMBAR SPINE FINDINGS Alignment: Grade 1 anterolisthesis of C5 on C6.  Otherwise normal. Skull base and vertebrae: No evidence SP cervical spine fracture. Multilevel degenerative changes. With likely mild-to-moderate spinal canal stenosis at C3-C4. There has been posterior decompression at the T5-T8 level. No evidence of an acute thoracic spine fracture. There may be an acute fracture at the right L4 transverse process (series 2, image 54). There is an acute fracture through the S4 vertebral body. Soft tissues and spinal canal: See separately dictated CT chest abdomen and pelvis for soft tissue findings in the chest abdomen and pelvis. Disc levels: Limitations: Assessment of the spinal canal limited due to CT technique. Within this limitation Cervical spine: Likely mild-to-moderate spinal canal stenosis at C3-C4. Thoracic spine: No evidence of high-grade spinal canal stenosis. Lumbar spine: Congenitally short pedicles. Severe spinal canal stenosis from the L1-L2 level to the L4-L5 levels. There is also severe bilateral neural foraminal stenosis from the L3-L4 to the L5-S1 levels. Upper chest: See separately dictated CT chest abdomen and pelvis. IMPRESSION: CT HEAD: 1. Subtle hyperdensity along the right tentorial leaflet, favored to represent  a small amount of extra-axial blood products. Recommend follow up CT to ensure stability. 2. Right frontal and left parietal scalp soft tissue swelling. No evidence of underlying calvarial fracture. CT CERVICAL SPINE: No acute cervical spine fracture. CT THORACIC SPINE: No acute thoracic spine fracture. CT LUMBAR SPINE: 1. Acute fracture through the S4 vertebral body. 2. Possible acute fracture of the right L4 transverse process. 3. Severe spinal canal stenosis from the L1-L2 level to the L4-L5 levels. Severe bilateral neural foraminal stenosis from the L3-L4 to the L5-S1 levels. Electronically Signed   By: Marin Roberts M.D.   On: 07/31/2022 18:08   DG Knee 2 Views Right  Result Date: 07/31/2022 CLINICAL DATA:  Swelling, fall. EXAM: RIGHT KNEE - 1-2 VIEW COMPARISON:  Right knee x-ray 07/27/2022 FINDINGS: Small joint effusion persists. There is prepatellar soft tissue swelling similar to the prior study. There is no acute fracture or dislocation. There is mediolateral compartment degenerative change and chondrocalcinosis similar to prior. Peripheral vascular calcifications are present. IMPRESSION: 1. No acute fracture or dislocation. 2. Small joint effusion and prepatellar soft tissue swelling similar to prior study. Electronically Signed   By: Ronney Asters M.D.   On: 07/31/2022 17:15   DG Chest Port 1 View  Result Date: 07/31/2022 CLINICAL DATA:  Trauma, fall. EXAM: PORTABLE CHEST 1 VIEW COMPARISON:  Chest x-ray 07/27/2022 FINDINGS: Heart is mildly enlarged, unchanged. The lungs are clear. There is no pleural effusion or pneumothorax. No acute fractures are seen. IMPRESSION: No acute cardiopulmonary process. Electronically Signed   By: Ronney Asters M.D.   On: 07/31/2022 17:14   DG Pelvis Portable  Result Date: 07/31/2022 CLINICAL DATA:  Trauma EXAM: PORTABLE PELVIS 1-2 VIEWS COMPARISON:  Pelvis x-ray 07/27/2022 FINDINGS: There is no evidence of pelvic fracture or diastasis. No pelvic bone lesions are seen. There are moderate degenerative changes of both hips similar to the prior study. IMPRESSION: No acute bony abnormality. Moderate degenerative changes of both hips. Electronically Signed   By: Ronney Asters M.D.   On: 07/31/2022 17:12     Labs:   Basic Metabolic Panel: Recent Labs  Lab 07/27/22 1503 07/31/22 1655 08/01/22 0300  NA 140 137  140 138  K 4.5 4.9  5.0 4.2  CL 107 104  106 106  CO2 '26 24 22  '$ GLUCOSE 107* 172*  165* 143*  BUN 26* 30*  33* 27*  CREATININE 1.54* 1.57*  1.60* 1.33*  CALCIUM 10.4* 10.2 9.8   GFR Estimated Creatinine Clearance: 56.4 mL/min (A) (by C-G formula  based on SCr of 1.33 mg/dL (H)). Liver Function Tests: Recent Labs  Lab 07/27/22 1503 07/31/22 1655 08/01/22 0300  AST 42* 25 21  ALT '8 11 11  '$ ALKPHOS 100 96 80  BILITOT 0.9 1.3* 1.1  PROT 6.7 6.7 6.0*  ALBUMIN 3.8 3.9 3.3*   No results for input(s): "LIPASE", "AMYLASE" in the last 168 hours. No results for input(s): "AMMONIA" in the last 168 hours. Coagulation profile Recent Labs  Lab 07/27/22 1503 07/31/22 1655  INR 2.8* 2.3*    CBC: Recent Labs  Lab 07/27/22 1503 07/31/22 1655 08/01/22 0300  WBC 2.5* 3.1* 3.9*  HGB 11.3* 10.6*  11.2* 9.2*  HCT 38.9* 34.9*  33.0* 30.0*  MCV 94.2 91.8 90.4  PLT 74* 73* 67*   Cardiac Enzymes: No results for input(s): "CKTOTAL", "CKMB", "CKMBINDEX", "TROPONINI" in the last 168 hours. BNP: Invalid input(s): "POCBNP" CBG: No results for input(s): "GLUCAP" in the last 168 hours. D-Dimer  No results for input(s): "DDIMER" in the last 72 hours. Hgb A1c No results for input(s): "HGBA1C" in the last 72 hours. Lipid Profile No results for input(s): "CHOL", "HDL", "LDLCALC", "TRIG", "CHOLHDL", "LDLDIRECT" in the last 72 hours. Thyroid function studies No results for input(s): "TSH", "T4TOTAL", "T3FREE", "THYROIDAB" in the last 72 hours.  Invalid input(s): "FREET3" Anemia work up No results for input(s): "VITAMINB12", "FOLATE", "FERRITIN", "TIBC", "IRON", "RETICCTPCT" in the last 72 hours. Microbiology Recent Results (from the past 240 hour(s))  Resp panel by RT-PCR (RSV, Flu A&B, Covid) Anterior Nasal Swab     Status: None   Collection Time: 07/27/22  3:12 PM   Specimen: Anterior Nasal Swab  Result Value Ref Range Status   SARS Coronavirus 2 by RT PCR NEGATIVE NEGATIVE Final    Comment: (NOTE) SARS-CoV-2 target nucleic acids are NOT DETECTED.  The SARS-CoV-2 RNA is generally detectable in upper respiratory specimens during the acute phase of infection. The lowest concentration of SARS-CoV-2 viral copies this assay can detect  is 138 copies/mL. A negative result does not preclude SARS-Cov-2 infection and should not be used as the sole basis for treatment or other patient management decisions. A negative result may occur with  improper specimen collection/handling, submission of specimen other than nasopharyngeal swab, presence of viral mutation(s) within the areas targeted by this assay, and inadequate number of viral copies(<138 copies/mL). A negative result must be combined with clinical observations, patient history, and epidemiological information. The expected result is Negative.  Fact Sheet for Patients:  EntrepreneurPulse.com.au  Fact Sheet for Healthcare Providers:  IncredibleEmployment.be  This test is no t yet approved or cleared by the [place] FDA and  has been authorized for detection and/or diagnosis of SARS-CoV-2 by FDA under an Emergency Use Authorization (EUA). This EUA will remain  in effect (meaning this test can be used) for the duration of the COVID-19 declaration under Section 564(b)(1) of the Act, 21 U.S.C.section 360bbb-3(b)(1), unless the authorization is terminated  or revoked sooner.       Influenza A by PCR NEGATIVE NEGATIVE Final   Influenza B by PCR NEGATIVE NEGATIVE Final    Comment: (NOTE) The Xpert Xpress SARS-CoV-2/FLU/RSV plus assay is intended as an aid in the diagnosis of influenza from Nasopharyngeal swab specimens and should not be used as a sole basis for treatment. Nasal washings and aspirates are unacceptable for Xpert Xpress SARS-CoV-2/FLU/RSV testing.  Fact Sheet for Patients: EntrepreneurPulse.com.au  Fact Sheet for Healthcare Providers: IncredibleEmployment.be  This test is not yet approved or cleared by the [place] FDA and has been authorized for detection and/or diagnosis of SARS-CoV-2 by FDA under an Emergency Use Authorization (EUA). This EUA will remain in effect  (meaning this test can be used) for the duration of the COVID-19 declaration under Section 564(b)(1) of the Act, 21 U.S.C. section 360bbb-3(b)(1), unless the authorization is terminated or revoked.     Resp Syncytial Virus by PCR NEGATIVE NEGATIVE Final    Comment: (NOTE) Fact Sheet for Patients: EntrepreneurPulse.com.au  Fact Sheet for Healthcare Providers: IncredibleEmployment.be  This test is not yet approved or cleared by the [place] FDA and has been authorized for detection and/or diagnosis of SARS-CoV-2 by FDA under an Emergency Use Authorization (EUA). This EUA will remain in effect (meaning this test can be used) for the duration of the COVID-19 declaration under Section 564(b)(1) of the Act, 21 U.S.C. section 360bbb-3(b)(1), unless the authorization is terminated or revoked.  Performed at [facility], [place]  [address], [place], [place] [address]     Time coordinating discharge: 35 minutes  Signed: Wanette Robison  [facility] 08/01/2022, 11:11 AM

## 2022-08-01 NOTE — Discharge Planning (Signed)
  Transition of Care [facility]) Screening Note   Patient Details  Name: Peter Singh Date of Birth: [birth year]   Transition of [facility]) CM/SW Contact:    Fuller Mandril, RN Phone Number: 08/01/2022, 11:43 AM    Transition of Care Department (TOC)consulted regarding pt return to [facility].  RNCM contacted [facility] RN to confirm pt plan to return.  RNCM provided EDRN phone number to call report. We will continue to monitor patient advancement through interdisciplinary progression rounds. If new patient transition needs arise, please place a TOC consult.

## 2022-08-01 NOTE — ED Notes (Signed)
RN has not received call from admitting team. Admitting MD messaged. Will page again. Safety sitter order placed. Staff frequent rounding on patient.

## 2022-08-01 NOTE — Consult Note (Signed)
   Providing Compassionate, Quality Care - Together  Neurosurgery Consult  Referring physician: ED Reason for referral: Traumatic subarachnoid hemorrhage/questionable subdural hematoma  Chief Complaint: Fall  History of Present Illness: This is an 89 year old male who presented to the emergency department after a ground-level fall.  He has a history of renal cell carcinoma, A-fib on Coumadin, BPH.  He denies any seizure-like activity.  He does complain of some mild back pain and sacral pain.  At this time he does not complain of any significant weakness, numbness or tingling.  CT of the brain showed very small tentorial subdural hematoma or traumatic subarachnoid without mass effect.   Medications: I have reviewed the patient's current medications. Allergies: No Known Allergies  History reviewed. No pertinent family history. Social History:  has no history on file for tobacco use, alcohol use, and drug use.  ROS: All pertinent positives and negatives are listed in HPI above  Physical Exam:  Vital signs in last 24 hours: Temp:  [98 F (36.7 C)-98.3 F (36.8 C)] 98 F (36.7 C) (07/25 1814) Pulse Rate:  [58-128] 65 (07/26 0746) Resp:  [11-18] 14 (07/26 0217) BP: (138-182)/(65-125) 153/88 (07/26 0700) SpO2:  [91 %-98 %] 96 % (07/26 0746) PE: Awake alert, disoriented PERRLA Speech fluent and appropriate Moves all extremities appropriately, follows commands x 4 Strength is symmetric in upper and lower extremities Sensory intact to light touch throughout   Impression/Assessment:  89 year old male with  Questionable traumatic subdural hematoma/subarachnoid hemorrhage Lumbar TP fracture  Plan:  -CT lumbar spine reviewed, TP fracture noted.  No acute neurosurgical intervention, weightbearing as tolerated.  No brace needed -Repeat CT pending -Hold anticoagulation, if repeat CT shows definitive hemorrhage, would recommend reversal of Coumadin. -No acute neurosurgical  intervention   Wende Longstreth C Chauncey Bruno 01/30/2020, 10:05 AM

## 2022-08-01 NOTE — ED Notes (Signed)
During report pt attempting to get out of bed. Pt verbally redirected to stay in the bed. Called staffing to obtain a sitter d/t high fall risk. Per staffing, no sitters available at this time.

## 2022-08-01 NOTE — ED Notes (Signed)
Discharge instructions discussed with wife and nurse at [facility]. Verbalized understanding. VSS. No questions or concerns regarding discharge. PTAR here to transport pt back to [facility]

## 2022-08-01 NOTE — Progress Notes (Signed)
PT Cancellation Note  Patient Details Name: Peter Singh MRN: [id number] DOB: 01-28-1932   Cancelled Treatment:    Reason Eval/Treat Not Completed: Other (comment). Per secure chat with Dr. Pietro Cassis, no need for acute PT evaluation at this time. Pt is preparing to discharge home, will receive PT once back at facility. PT signing off.   Zenaida Niece 08/01/2022, 12:29 PM

## 2022-08-01 NOTE — ED Notes (Signed)
Pt attempting to get out of bed, increased confusion. PT requiring repeated redirection by staff. Admitting team paged.

## 2022-08-01 NOTE — Consult Note (Signed)
Reason for Consult:Sacral fx Referring Physician: Marlowe Aschoff Dahal Time called: 5170 Time at bedside: [name] is an 89 y.o. male.  HPI: Peter Singh fell at [employer] [facility] where he resides. He was brought to the ED where workup showed a low sacral fx and orthopedic surgery was consulted. He denies symptoms at this point. Note was made of a right knee bursitis that he says has been there for years. He is demented and am unsure how much his history can be trusted.  Past Medical History:  Diagnosis Date   Adult failure to thrive    Per incoming records from [facility]   Allergic rhinitis    Per incoming records from [facility]   Atrial fibrillation [facility])    BPH (benign prostatic hyperplasia)    Per incoming records from [place] of kidney [facility])    Right, Per incoming records from [place]    Per incoming records from [facility]   Cerebrovascular accident, late effects    Per incoming records from [place] kidney disease, stage III (moderate) ([place])    Per incoming records from [facility])    Per incoming records from [facility]   Cognitive impairment    Mild, Per incoming records from [facility]   Constipation    Per incoming records from [facility]   Dementia [facility])    Diarrhea    Better off Aricept, Per incoming records from [facility]   Diastolic dysfunction    on 2D Echo 07/2009 and 2016, Per incoming records from [facility]   Diverticulosis    Mild, Per incoming records from [facility]   Diverticulosis of colon (without mention of hemorrhage)    ED (erectile dysfunction)    Per incoming records from [facility]   History of Coumadin therapy    Per incoming records from [facility]   History of echocardiogram 09/2014   Per incoming records from [facility]   Hyperlipemia    Hypertension    Kidney mass    Per incoming records from [place] leg edema    Per incoming records from [facility] [facility])    Neuropathy    Per incoming records from [facility]   OA (osteoarthritis)    Per incoming records from [facility]   Peripheral neuropathy    Per incoming records from [place] history of colonic [place] & 2004   adenomatous polyps   Pulmonary arterial hypertension ([place])    Per incoming records from [place]    Per incoming records from [facility]   Thrombocytopenia 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 His smoking use included cigarettes. He  smoked an average of 3 packs per day. He has never used smokeless tobacco. He reports current alcohol use. He reports that he does not use drugs.  Allergies:  Allergies  Allergen Reactions   Penicillins Rash    Has patient had a PCN reaction causing immediate rash, facial/tongue/throat swelling, SOB or lightheadedness with hypotension: unknown Has patient had a PCN reaction causing severe rash involving mucus membranes or skin necrosis: unknown Has patient had a PCN reaction that required hospitalization : unknown Has patient had a PCN reaction occurring within the last 10 years: no If all of the above answers are "NO", then may proceed with Cephalosporin use.     Medications: I have reviewed the patient's current medications.  Results for orders placed or performed during the hospital encounter of 07/31/22 (from the past 48 hour(s))  Comprehensive metabolic panel     Status: Abnormal   Collection Time: 07/31/22  4:55 PM  Result Value Ref Range   Sodium 137 135 - 145 mmol/L   Potassium 4.9 3.5 - 5.1 mmol/L   Chloride 104 98 - 111 mmol/L   CO2 24 22 - 32 mmol/L   Glucose, Bld 172 (H) 70 - 99 mg/dL    Comment: Glucose reference range applies only to samples taken after fasting for at least 8 hours.   BUN 30 (H) 8 - 23 mg/dL   Creatinine, Ser 1.57 (H) 0.61 - 1.24 mg/dL   Calcium 10.2 8.9 - 10.3 mg/dL   Total Protein 6.7 6.5 - 8.1 g/dL   Albumin 3.9 3.5 - 5.0 g/dL   AST 25 15 - 41 U/L   ALT 11 0 - 44 U/L   Alkaline Phosphatase 96 38 - 126 U/L   Total Bilirubin 1.3 (H) 0.3 - 1.2 mg/dL   GFR, Estimated 42 (L) >60 mL/min    Comment: (NOTE) Calculated using the CKD-EPI Creatinine Equation (2021)    Anion gap 9 5 - 15    Comment: Performed at [place] [address] [place], [place] [address]  I-Stat Chem 8, ED     Status: Abnormal   Collection Time: 07/31/22  4:55 PM  Result Value Ref Range   Sodium 140 135 - 145 mmol/L   Potassium 5.0 3.5 - 5.1 mmol/L   Chloride 106  98 - 111 mmol/L   BUN 33 (H) 8 - 23 mg/dL   Creatinine, Ser 1.60 (H) 0.61 - 1.24 mg/dL   Glucose, Bld 165 (H) 70 - 99 mg/dL    Comment: Glucose reference range applies only to samples taken after fasting for at least 8 hours.   Calcium, Ion 1.28 1.15 - 1.40 mmol/L   TCO2 25 22 - 32 mmol/L   Hemoglobin 11.2 (L) 13.0 - 17.0 g/dL   HCT 33.0 (L) 39.0 - 52.0 %  CBC     Status: Abnormal   Collection Time: 07/31/22  4:55 PM  Result Value Ref Range   WBC 3.1 (L) 4.0 - 10.5 K/uL   RBC 3.80 (L) 4.22 - 5.81 MIL/uL   Hemoglobin 10.6 (L) 13.0 - 17.0 g/dL   HCT 34.9 (L) 39.0 - 52.0 %   MCV 91.8 80.0 - 100.0 fL   MCH 27.9 26.0 - 34.0 pg   MCHC 30.4 30.0 - 36.0 g/dL   RDW 19.1 (H) 11.5 - 15.5 %   Platelets 73 (L) 150 - 400 K/uL    Comment: Immature Platelet Fraction may be clinically indicated, consider ordering this additional test [id number] REPEATED TO VERIFY    nRBC 0.0 0.0 - 0.2 %    Comment: Performed at [facility], [place] [address], [place], [place] [address]  Sample to Blood Bank     Status: None   Collection Time: 07/31/22  4:55 PM  Result Value Ref Range   Blood Bank Specimen SAMPLE AVAILABLE FOR TESTING    Sample Expiration      08/03/2022,2359 Performed at [facility], [place] [address], [place], [place] [address]   Protime-INR     Status: Abnormal   Collection Time: 07/31/22  4:55 PM  Result Value Ref Range   Prothrombin Time 25.1 (H) 11.4 - 15.2 seconds   INR 2.3 (H) 0.8 - 1.2    Comment: (NOTE) INR goal varies based on device and disease states. Performed at [facility], [place] [address] [place], [place] [address]   Urinalysis, Routine w reflex microscopic -Urine, Random     Status: Abnormal   Collection Time: 07/31/22  8:41 PM  Result Value Ref Range   Color, Urine YELLOW YELLOW   APPearance HAZY (A) CLEAR   Specific Gravity, Urine 1.027 1.005 - 1.030   pH 5.0 5.0 - 8.0   Glucose, UA 50 (A) NEGATIVE mg/dL   Hgb urine dipstick NEGATIVE NEGATIVE    Bilirubin Urine NEGATIVE NEGATIVE   Ketones, ur NEGATIVE NEGATIVE mg/dL   Protein, ur 100 (A) NEGATIVE mg/dL   Nitrite POSITIVE (A) NEGATIVE   Leukocytes,Ua SMALL (A) NEGATIVE   RBC / HPF 0-5 0 - 5 RBC/hpf   WBC, UA 11-20 0 - 5 WBC/hpf   Bacteria, UA MANY (A) NONE SEEN   Squamous Epithelial / HPF 0-5 0 - 5 /HPF   Mucus PRESENT    Hyaline Casts, UA PRESENT    Amorphous Crystal PRESENT     Comment: Performed at [facility], [address] [place], [place] [address]  Comprehensive metabolic panel     Status: Abnormal   Collection Time: 08/01/22  3:00 AM  Result Value Ref Range   Sodium 138 135 - 145 mmol/L   Potassium 4.2 3.5 - 5.1 mmol/L   Chloride 106 98 - 111 mmol/L   CO2 22 22 - 32 mmol/L   Glucose, Bld 143 (H) 70 - 99 mg/dL    Comment: Glucose reference range applies only to samples taken after fasting for at least 8 hours.   BUN 27 (H) 8 - 23 mg/dL   Creatinine, Ser 1.33 (H) 0.61 - 1.24 mg/dL   Calcium 9.8 8.9 - 10.3 mg/dL   Total Protein 6.0 (L) 6.5 - 8.1 g/dL   Albumin 3.3 (L) 3.5 - 5.0 g/dL   AST 21 15 - 41 U/L   ALT 11 0 - 44 U/L   Alkaline Phosphatase 80 38 - 126  U/L   Total Bilirubin 1.1 0.3 - 1.2 mg/dL   GFR, Estimated 51 (L) >60 mL/min    Comment: (NOTE) Calculated using the CKD-EPI Creatinine Equation (2021)    Anion gap 10 5 - 15    Comment: Performed at [place] [address], [place], [place] [address]  CBC     Status: Abnormal   Collection Time: 08/01/22  3:00 AM  Result Value Ref Range   WBC 3.9 (L) 4.0 - 10.5 K/uL   RBC 3.32 (L) 4.22 - 5.81 MIL/uL   Hemoglobin 9.2 (L) 13.0 - 17.0 g/dL   HCT 30.0 (L) 39.0 - 52.0 %   MCV 90.4 80.0 - 100.0 fL   MCH 27.7 26.0 - 34.0 pg   MCHC 30.7 30.0 - 36.0 g/dL   RDW 18.7 (H) 11.5 - 15.5 %   Platelets 67 (L) 150 - 400 K/uL    Comment: Immature Platelet Fraction may be clinically indicated, consider ordering this additional test [id number] REPEATED TO VERIFY    nRBC 0.0 0.0 - 0.2 %    Comment:  Performed at [facility], [place] [address], [place], [place] [address]    CT HEAD WO CONTRAST (5MM)  Result Date: 08/01/2022 CLINICAL DATA:  Known hemorrhage EXAM: CT HEAD WITHOUT CONTRAST TECHNIQUE: Contiguous axial images were obtained from the base of the skull through the vertex without intravenous contrast. RADIATION DOSE REDUCTION: This exam was performed according to the departmental dose-optimization program which includes automated exposure control, adjustment of the mA and/or kV according to patient size and/or use of iterative reconstruction technique. COMPARISON:  CT head 1 day prior FINDINGS: Brain: Small volume subarachnoid hemorrhage along the underside of the right temporal lobe is unchanged (5-43). There is no edema or mass effect. There is no other acute intracranial hemorrhage or extra-axial fluid collection. There is no evidence of acute infarct. Parenchymal volume is stable. The ventricles are stable in size. Gray-white differentiation is preserved. Pituitary and suprasellar region are normal. There is no mass lesion or midline shift. Vascular: There is calcification of the bilateral carotid siphons and vertebral arteries. Skull: Normal. Negative for fracture or focal lesion. Sinuses/Orbits: The paranasal sinuses are clear. Bilateral lens implants are in place. The globes and orbits are otherwise unremarkable. Other: Right frontal and left parietal scalp swelling is again seen. IMPRESSION: Stable small volume subarachnoid hemorrhage overlying the right temporal lobe. No new acute intracranial pathology. Electronically Signed   By: Valetta Mole M.D.   On: 08/01/2022 08:07   CT CHEST ABDOMEN PELVIS W CONTRAST  Result Date: 07/31/2022 CLINICAL DATA:  Polytrauma, blunt [id number] Trauma [id number] EXAM: CT CHEST, ABDOMEN, AND PELVIS WITH CONTRAST TECHNIQUE: Multidetector CT imaging of the chest, abdomen and pelvis was performed following the standard protocol during bolus administration of  intravenous contrast. RADIATION DOSE REDUCTION: This exam was performed according to the departmental dose-optimization program which includes automated exposure control, adjustment of the mA and/or kV according to patient size and/or use of iterative reconstruction technique. CONTRAST:  27m OMNIPAQUE IOHEXOL 350 MG/ML SOLN COMPARISON:  CT abdomen pelvis 06/10/2018, CT abdomen pelvis 06/17/2019 FINDINGS: CHEST: Cardiovascular: No aortic injury. The ascending thoracic aorta is enlarged in caliber measuring up to 4.3 cm. The descending thoracic aorta is normal in caliber. Severe atherosclerotic plaque. Aortic valve leaflet calcification. At least 2 vessel coronary artery calcification. The heart is mildly enlarged in size. No significant pericardial effusion. The main pulmonary artery is enlarged in caliber measuring up to 3.5  cm. No main pulmonary embolus. Mediastinum/Nodes: No pneumomediastinum. No mediastinal hematoma. The esophagus is unremarkable. The thyroid is unremarkable. The central airways are patent. No mediastinal, hilar, or axillary lymphadenopathy. Lungs/Pleura: Diffuse bronchial wall thickening. No focal consolidation. Left apical 7 mm subsolid pulmonary nodule (5:5). Slightly more inferior 5 mm solid left upper lobe pulmonary nodule (5:53). Right middle lobe 5 mm ground-glass airspace opacity (5:101). No pulmonary mass. No pulmonary contusion or laceration. No pneumatocele formation. Low-density right pleural fluid which may represent a trace pleural effusion versus chylothorax. No pneumothorax. No hemothorax. Musculoskeletal/Chest wall: No chest wall mass. No acute rib or sternal fracture. Please see separately dictated CT thoracolumbar spine 07/31/2022. ABDOMEN / PELVIS: Hepatobiliary: Not enlarged. No focal lesion. No laceration or subcapsular hematoma. The gallbladder is otherwise unremarkable with no radio-opaque gallstones. No biliary ductal dilatation. Pancreas: Normal pancreatic contour. No  main pancreatic duct dilatation. Spleen: Not enlarged. No focal lesion. No laceration, subcapsular hematoma, or vascular injury. Adrenals/Urinary Tract: No nodularity bilaterally. Nonspecific bilateral perinephric fat stranding. Bilateral kidneys enhance symmetrically. Interval increase in size of a right superior renal pole mass measuring at least 5 cm (from 3.7 cm) versus interval development of an adjacent new mass. Punctate left nephrolithiasis. No hydronephrosis. No contusion, laceration, or subcapsular hematoma. No injury to the vascular structures or collecting systems. No hydroureter. Suprapubic catheter terminates within in the collapsed urinary bladder lumen. Several calcified stones are noted within the gallbladder lumen measuring up to 8 mm. Stomach/Bowel: No small or large bowel wall thickening or dilatation. The appendix is not definitely identified with no inflammatory changes in the right lower quadrant to suggest acute appendicitis. Vasculature/Lymphatics: Severe atherosclerotic plaque. No abdominal aorta or iliac aneurysm. No active contrast extravasation or pseudoaneurysm. No abdominal, pelvic, inguinal lymphadenopathy. Reproductive: Prostate enlarged in size measuring up to 6.2 cm. Other: No simple free fluid ascites. No pneumoperitoneum. No hemoperitoneum. No mesenteric hematoma identified. No organized fluid collection. Musculoskeletal: No significant soft tissue hematoma. Age-indeterminate, possibly acute, displaced right S4 sacral ala fracture with extension to the right S4 and S5 sacral foramina as well as extension to the left S3 sacral ala and left S3 sacral foramina (7:136, 8: 46). Please see separately dictated CT thoracolumbar spine 07/31/2022. No extension to the sacroiliac joints. No pelvic diastasis. No other acute displaced pelvic fracture. Ports and Devices: None. IMPRESSION: 1. Age-indeterminate, possibly acute, displaced bilateral sacral ala fractures extending to the right S4  and S5 as well as left S3 sacral foramina. 2. No acute intrathoracic, intra-abdominal, intrapelvic traumatic injury. 3. Please see separately dictated CT thoracolumbar spine as well as CT head and C-spine 07/31/2022. Other imaging findings of potential clinical significance: 1. Interval increase in size of a right superior renal pole mass measuring at least 5 cm (from 3.7 cm) versus interval development of an adjacent new mass. Findings suggestive of malignancy. When the patient is clinically stable and able to follow directions and hold their breath (preferably as an outpatient) further evaluation with dedicated MRI renal protocol should be considered. 2. 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 Recommend annual imaging followup by CTA or MRA. This recommendation follows 2010 ACCF/AHA/AATS/ACR/ASA/SCA/SCAI/SIR/STS/SVM Guidelines for the Diagnosis and Management of Patients with Thoracic Aortic Disease. Circulation. 2010; 121: W295-A213. Aortic aneurysm NOS (ICD10-I71.9) . 3. Mild cardiomegaly. 4. Enlarged main pulmonary artery suggestive of pulmonary hypertension. 5. Left apical 7 mm subsolid pulmonary nodule. Follow-up non-contrast CT recommended at 3-6 months to confirm persistence. If unchanged, and solid component remains <6 mm, annual CT is recommended  until 5 years of stability has been established. If persistent these nodules should be considered highly suspicious if the solid component of the nodule is 6 mm or greater in size and enlarging. This recommendation follows the consensus statement: Guidelines for Management of Incidental Pulmonary Nodules Detected on CT Images: From the Fleischner Society 2017; Radiology 2017; 284:228-243. 6. Low-density right pleural fluid which may represent a trace pleural effusion versus chylothorax. 7. Nonobstructive left nephrolithiasis. 8. Calcified stones within the urinary bladder lumen. Urinary bladder decompressed with suprapubic catheter in appropriate position.  9. Prostatomegaly. 10. Aortic Atherosclerosis (ICD10-I70.0) including coronary artery calcification and aortic valve leaflet calcifications-correlate for aortic stenosis. Electronically Signed   By: Iven Finn M.D.   On: 07/31/2022 18:45   CT HEAD WO CONTRAST  Result Date: 07/31/2022 CLINICAL DATA:  Trauma EXAM: CT HEAD WITHOUT CONTRAST CT CERVICAL SPINE WITHOUT CONTRAST CT THORACIC SPINE WITHOUT CONTRAST CT LUMBAR SPINE WITHOUT CONTRAST TECHNIQUE: Multidetector CT imaging of the head and cervical, thoracic, and lumbar spine was performed following the standard protocol without intravenous contrast. Multiplanar CT image reconstructions of the cervical spine were also generated. RADIATION DOSE REDUCTION: This exam was performed according to the departmental dose-optimization program which includes automated exposure control, adjustment of the mA and/or kV according to patient size and/or use of iterative reconstruction technique. COMPARISON:  CT Head 05/01/19 FINDINGS: CT HEAD FINDINGS Brain: No evidence of acute infarction, hydrocephalus, or mass lesion/mass effect. There is a subtle hyperdensity along the right tentorial leaflet (series 5, image 49), which is new compared to 05/01/2019, and favored to represent a small amount of extra-axial blood products. There is advanced generalized volume loss. Vascular: No hyperdense vessel or unexpected calcification. Skull: Soft tissue swelling along the right frontal scalp in the left parietal scalp. No evidence of underlying calvarial fracture. Sinuses/Orbits: Bilateral lens replacement. No evidence of mastoid or middle ear effusion. Paranasal sinuses are clear. Other: None CT CERVICAL, THORACIC, LUMBAR SPINE FINDINGS Alignment: Grade 1 anterolisthesis of C5 on C6.  Otherwise normal. Skull base and vertebrae: No evidence SP cervical spine fracture. Multilevel degenerative changes. With likely mild-to-moderate spinal canal stenosis at C3-C4. There has been  posterior decompression at the T5-T8 level. No evidence of an acute thoracic spine fracture. There may be an acute fracture at the right L4 transverse process (series 2, image 54). There is an acute fracture through the S4 vertebral body. Soft tissues and spinal canal: See separately dictated CT chest abdomen and pelvis for soft tissue findings in the chest abdomen and pelvis. Disc levels: Limitations: Assessment of the spinal canal limited due to CT technique. Within this limitation Cervical spine: Likely mild-to-moderate spinal canal stenosis at C3-C4. Thoracic spine: No evidence of high-grade spinal canal stenosis. Lumbar spine: Congenitally short pedicles. Severe spinal canal stenosis from the L1-L2 level to the L4-L5 levels. There is also severe bilateral neural foraminal stenosis from the L3-L4 to the L5-S1 levels. Upper chest: See separately dictated CT chest abdomen and pelvis. IMPRESSION: CT HEAD: 1. Subtle hyperdensity along the right tentorial leaflet, favored to represent a small amount of extra-axial blood products. Recommend follow up CT to ensure stability. 2. Right frontal and left parietal scalp soft tissue swelling. No evidence of underlying calvarial fracture. CT CERVICAL SPINE: No acute cervical spine fracture. CT THORACIC SPINE: No acute thoracic spine fracture. CT LUMBAR SPINE: 1. Acute fracture through the S4 vertebral body. 2. Possible acute fracture of the right L4 transverse process. 3. Severe spinal canal stenosis from the L1-L2 level  to the L4-L5 levels. Severe bilateral neural foraminal stenosis from the L3-L4 to the L5-S1 levels. Electronically Signed   By: Marin Roberts M.D.   On: 07/31/2022 18:08   CT CERVICAL SPINE WO CONTRAST  Result Date: 07/31/2022 CLINICAL DATA:  Trauma EXAM: CT HEAD WITHOUT CONTRAST CT CERVICAL SPINE WITHOUT CONTRAST CT THORACIC SPINE WITHOUT CONTRAST CT LUMBAR SPINE WITHOUT CONTRAST TECHNIQUE: Multidetector CT imaging of the head and cervical, thoracic, and  lumbar spine was performed following the standard protocol without intravenous contrast. Multiplanar CT image reconstructions of the cervical spine were also generated. RADIATION DOSE REDUCTION: This exam was performed according to the departmental dose-optimization program which includes automated exposure control, adjustment of the mA and/or kV according to patient size and/or use of iterative reconstruction technique. COMPARISON:  CT Head 05/01/19 FINDINGS: CT HEAD FINDINGS Brain: No evidence of acute infarction, hydrocephalus, or mass lesion/mass effect. There is a subtle hyperdensity along the right tentorial leaflet (series 5, image 49), which is new compared to 05/01/2019, and favored to represent a small amount of extra-axial blood products. There is advanced generalized volume loss. Vascular: No hyperdense vessel or unexpected calcification. Skull: Soft tissue swelling along the right frontal scalp in the left parietal scalp. No evidence of underlying calvarial fracture. Sinuses/Orbits: Bilateral lens replacement. No evidence of mastoid or middle ear effusion. Paranasal sinuses are clear. Other: None CT CERVICAL, THORACIC, LUMBAR SPINE FINDINGS Alignment: Grade 1 anterolisthesis of C5 on C6.  Otherwise normal. Skull base and vertebrae: No evidence SP cervical spine fracture. Multilevel degenerative changes. With likely mild-to-moderate spinal canal stenosis at C3-C4. There has been posterior decompression at the T5-T8 level. No evidence of an acute thoracic spine fracture. There may be an acute fracture at the right L4 transverse process (series 2, image 54). There is an acute fracture through the S4 vertebral body. Soft tissues and spinal canal: See separately dictated CT chest abdomen and pelvis for soft tissue findings in the chest abdomen and pelvis. Disc levels: Limitations: Assessment of the spinal canal limited due to CT technique. Within this limitation Cervical spine: Likely mild-to-moderate spinal  canal stenosis at C3-C4. Thoracic spine: No evidence of high-grade spinal canal stenosis. Lumbar spine: Congenitally short pedicles. Severe spinal canal stenosis from the L1-L2 level to the L4-L5 levels. There is also severe bilateral neural foraminal stenosis from the L3-L4 to the L5-S1 levels. Upper chest: See separately dictated CT chest abdomen and pelvis. IMPRESSION: CT HEAD: 1. Subtle hyperdensity along the right tentorial leaflet, favored to represent a small amount of extra-axial blood products. Recommend follow up CT to ensure stability. 2. Right frontal and left parietal scalp soft tissue swelling. No evidence of underlying calvarial fracture. CT CERVICAL SPINE: No acute cervical spine fracture. CT THORACIC SPINE: No acute thoracic spine fracture. CT LUMBAR SPINE: 1. Acute fracture through the S4 vertebral body. 2. Possible acute fracture of the right L4 transverse process. 3. Severe spinal canal stenosis from the L1-L2 level to the L4-L5 levels. Severe bilateral neural foraminal stenosis from the L3-L4 to the L5-S1 levels. Electronically Signed   By: Marin Roberts M.D.   On: 07/31/2022 18:08   CT T-SPINE NO CHARGE  Result Date: 07/31/2022 CLINICAL DATA:  Trauma EXAM: CT HEAD WITHOUT CONTRAST CT CERVICAL SPINE WITHOUT CONTRAST CT THORACIC SPINE WITHOUT CONTRAST CT LUMBAR SPINE WITHOUT CONTRAST TECHNIQUE: Multidetector CT imaging of the head and cervical, thoracic, and lumbar spine was performed following the standard protocol without intravenous contrast. Multiplanar CT image reconstructions of the cervical spine  were also generated. RADIATION DOSE REDUCTION: This exam was performed according to the departmental dose-optimization program which includes automated exposure control, adjustment of the mA and/or kV according to patient size and/or use of iterative reconstruction technique. COMPARISON:  CT Head 05/01/19 FINDINGS: CT HEAD FINDINGS Brain: No evidence of acute infarction, hydrocephalus, or mass  lesion/mass effect. There is a subtle hyperdensity along the right tentorial leaflet (series 5, image 49), which is new compared to 05/01/2019, and favored to represent a small amount of extra-axial blood products. There is advanced generalized volume loss. Vascular: No hyperdense vessel or unexpected calcification. Skull: Soft tissue swelling along the right frontal scalp in the left parietal scalp. No evidence of underlying calvarial fracture. Sinuses/Orbits: Bilateral lens replacement. No evidence of mastoid or middle ear effusion. Paranasal sinuses are clear. Other: None CT CERVICAL, THORACIC, LUMBAR SPINE FINDINGS Alignment: Grade 1 anterolisthesis of C5 on C6.  Otherwise normal. Skull base and vertebrae: No evidence SP cervical spine fracture. Multilevel degenerative changes. With likely mild-to-moderate spinal canal stenosis at C3-C4. There has been posterior decompression at the T5-T8 level. No evidence of an acute thoracic spine fracture. There may be an acute fracture at the right L4 transverse process (series 2, image 54). There is an acute fracture through the S4 vertebral body. Soft tissues and spinal canal: See separately dictated CT chest abdomen and pelvis for soft tissue findings in the chest abdomen and pelvis. Disc levels: Limitations: Assessment of the spinal canal limited due to CT technique. Within this limitation Cervical spine: Likely mild-to-moderate spinal canal stenosis at C3-C4. Thoracic spine: No evidence of high-grade spinal canal stenosis. Lumbar spine: Congenitally short pedicles. Severe spinal canal stenosis from the L1-L2 level to the L4-L5 levels. There is also severe bilateral neural foraminal stenosis from the L3-L4 to the L5-S1 levels. Upper chest: See separately dictated CT chest abdomen and pelvis. IMPRESSION: CT HEAD: 1. Subtle hyperdensity along the right tentorial leaflet, favored to represent a small amount of extra-axial blood products. Recommend follow up CT to ensure  stability. 2. Right frontal and left parietal scalp soft tissue swelling. No evidence of underlying calvarial fracture. CT CERVICAL SPINE: No acute cervical spine fracture. CT THORACIC SPINE: No acute thoracic spine fracture. CT LUMBAR SPINE: 1. Acute fracture through the S4 vertebral body. 2. Possible acute fracture of the right L4 transverse process. 3. Severe spinal canal stenosis from the L1-L2 level to the L4-L5 levels. Severe bilateral neural foraminal stenosis from the L3-L4 to the L5-S1 levels. Electronically Signed   By: Marin Roberts M.D.   On: 07/31/2022 18:08   CT L-SPINE NO CHARGE  Result Date: 07/31/2022 CLINICAL DATA:  Trauma EXAM: CT HEAD WITHOUT CONTRAST CT CERVICAL SPINE WITHOUT CONTRAST CT THORACIC SPINE WITHOUT CONTRAST CT LUMBAR SPINE WITHOUT CONTRAST TECHNIQUE: Multidetector CT imaging of the head and cervical, thoracic, and lumbar spine was performed following the standard protocol without intravenous contrast. Multiplanar CT image reconstructions of the cervical spine were also generated. RADIATION DOSE REDUCTION: This exam was performed according to the departmental dose-optimization program which includes automated exposure control, adjustment of the mA and/or kV according to patient size and/or use of iterative reconstruction technique. COMPARISON:  CT Head 05/01/19 FINDINGS: CT HEAD FINDINGS Brain: No evidence of acute infarction, hydrocephalus, or mass lesion/mass effect. There is a subtle hyperdensity along the right tentorial leaflet (series 5, image 49), which is new compared to 05/01/2019, and favored to represent a small amount of extra-axial blood products. There is advanced generalized volume loss. Vascular:  No hyperdense vessel or unexpected calcification. Skull: Soft tissue swelling along the right frontal scalp in the left parietal scalp. No evidence of underlying calvarial fracture. Sinuses/Orbits: Bilateral lens replacement. No evidence of mastoid or middle ear effusion.  Paranasal sinuses are clear. Other: None CT CERVICAL, THORACIC, LUMBAR SPINE FINDINGS Alignment: Grade 1 anterolisthesis of C5 on C6.  Otherwise normal. Skull base and vertebrae: No evidence SP cervical spine fracture. Multilevel degenerative changes. With likely mild-to-moderate spinal canal stenosis at C3-C4. There has been posterior decompression at the T5-T8 level. No evidence of an acute thoracic spine fracture. There may be an acute fracture at the right L4 transverse process (series 2, image 54). There is an acute fracture through the S4 vertebral body. Soft tissues and spinal canal: See separately dictated CT chest abdomen and pelvis for soft tissue findings in the chest abdomen and pelvis. Disc levels: Limitations: Assessment of the spinal canal limited due to CT technique. Within this limitation Cervical spine: Likely mild-to-moderate spinal canal stenosis at C3-C4. Thoracic spine: No evidence of high-grade spinal canal stenosis. Lumbar spine: Congenitally short pedicles. Severe spinal canal stenosis from the L1-L2 level to the L4-L5 levels. There is also severe bilateral neural foraminal stenosis from the L3-L4 to the L5-S1 levels. Upper chest: See separately dictated CT chest abdomen and pelvis. IMPRESSION: CT HEAD: 1. Subtle hyperdensity along the right tentorial leaflet, favored to represent a small amount of extra-axial blood products. Recommend follow up CT to ensure stability. 2. Right frontal and left parietal scalp soft tissue swelling. No evidence of underlying calvarial fracture. CT CERVICAL SPINE: No acute cervical spine fracture. CT THORACIC SPINE: No acute thoracic spine fracture. CT LUMBAR SPINE: 1. Acute fracture through the S4 vertebral body. 2. Possible acute fracture of the right L4 transverse process. 3. Severe spinal canal stenosis from the L1-L2 level to the L4-L5 levels. Severe bilateral neural foraminal stenosis from the L3-L4 to the L5-S1 levels. Electronically Signed   By: Marin Roberts M.D.   On: 07/31/2022 18:08   DG Knee 2 Views Right  Result Date: 07/31/2022 CLINICAL DATA:  Swelling, fall. EXAM: RIGHT KNEE - 1-2 VIEW COMPARISON:  Right knee x-ray 07/27/2022 FINDINGS: Small joint effusion persists. There is prepatellar soft tissue swelling similar to the prior study. There is no acute fracture or dislocation. There is mediolateral compartment degenerative change and chondrocalcinosis similar to prior. Peripheral vascular calcifications are present. IMPRESSION: 1. No acute fracture or dislocation. 2. Small joint effusion and prepatellar soft tissue swelling similar to prior study. Electronically Signed   By: Ronney Asters M.D.   On: 07/31/2022 17:15   DG Chest Port 1 View  Result Date: 07/31/2022 CLINICAL DATA:  Trauma, fall. EXAM: PORTABLE CHEST 1 VIEW COMPARISON:  Chest x-ray 07/27/2022 FINDINGS: Heart is mildly enlarged, unchanged. The lungs are clear. There is no pleural effusion or pneumothorax. No acute fractures are seen. IMPRESSION: No acute cardiopulmonary process. Electronically Signed   By: Ronney Asters M.D.   On: 07/31/2022 17:14   DG Pelvis Portable  Result Date: 07/31/2022 CLINICAL DATA:  Trauma EXAM: PORTABLE PELVIS 1-2 VIEWS COMPARISON:  Pelvis x-ray 07/27/2022 FINDINGS: There is no evidence of pelvic fracture or diastasis. No pelvic bone lesions are seen. There are moderate degenerative changes of both hips similar to the prior study. IMPRESSION: No acute bony abnormality. Moderate degenerative changes of both hips. Electronically Signed   By: Ronney Asters M.D.   On: 07/31/2022 17:12    Review of Systems  HENT:  Negative for ear discharge, ear pain, hearing loss and tinnitus.   Eyes:  Negative for photophobia and pain.  Respiratory:  Negative for cough and shortness of breath.   Cardiovascular:  Negative for chest pain.  Gastrointestinal:  Negative for abdominal pain, nausea and vomiting.  Genitourinary:  Negative for dysuria, flank pain, frequency and  urgency.  Musculoskeletal:  Positive for back pain. Negative for myalgias and neck pain.  Neurological:  Negative for dizziness and headaches.  Hematological:  Does not bruise/bleed easily.  Psychiatric/Behavioral:  The patient is not nervous/anxious.    Blood pressure 134/73, pulse 76, temperature (!) 97.5 F (36.4 C), temperature source Oral, resp. rate (!) 27, height '6\' 7"'$  (2.007 m), weight 119 kg, SpO2 93 %. Physical Exam Constitutional:      General: He is not in acute distress.    Appearance: He is well-developed. He is not diaphoretic.  HENT:     Head: Normocephalic and atraumatic.  Eyes:     General: No scleral icterus.       Right eye: No discharge.        Left eye: No discharge.     Conjunctiva/sclera: Conjunctivae normal.  Cardiovascular:     Rate and Rhythm: Normal rate and regular rhythm.  Pulmonary:     Effort: Pulmonary effort is normal. No respiratory distress.  Musculoskeletal:     Cervical back: Normal range of motion.     Comments: Pelvis--no traumatic wounds or rash, no ecchymosis, stable to manual stress, nontender  BLE No traumatic wounds or ecchymosis. Right knee bursitis/cellulitis  Nontender  No knee or ankle effusion  Knee stable to varus/ valgus and anterior/posterior stress  Sens DPN, SPN, TN intact  Motor EHL, ext, flex, evers 5/5  DP 2+, PT 2+, No significant edema  Skin:    General: Skin is warm and dry.  Neurological:     Mental Status: He is alert.  Psychiatric:        Mood and Affect: Mood normal.        Behavior: Behavior normal.     Assessment/Plan: Sacral fx -- No stability concerns. He may be WBAT BLE. Does not need f/u unless become symptomatic in which case may see Dr. Mable Fill. Right knee bursitis -- If this is truly chronic then I suppose does not need to be treated. It certainly doesn't seem to bother him. If family tells a different story then would treat with typical abx. May be best to aspirate before those are  started.    Lisette Abu, PA-C Orthopedic Surgery [phone] 08/01/2022, 8:45 AM

## 2022-08-01 NOTE — ED Notes (Signed)
[facility] called for transport.  

## 2022-08-01 NOTE — Progress Notes (Signed)
Subjective: CC: Patient's wife at bedside.  Patient does not remember the events.  He is oriented to self.  Does not know year, where he is or why he is here.  Denies any pain.  Reports he is tolerating a diet without nausea or vomiting.  Has not been out of bed.  Chronic SP tube with output in bag.  Objective: Vital signs in last 24 hours: Temp:  [97.5 F (36.4 C)-98.4 F (36.9 C)] 97.5 F (36.4 C) (01/26 0740) Pulse Rate:  [41-88] 76 (01/26 0700) Resp:  [12-27] 27 (01/26 0700) BP: (120-170)/(69-114) 134/73 (01/26 0700) SpO2:  [91 %-100 %] 93 % (01/26 0700) Weight:  [741 kg] 119 kg (01/25 1702)    Intake/Output from previous day: No intake/output data recorded. Intake/Output this shift: Total I/O In: 708.8 [I.V.:708.8] Out: -   PE: Gen:  Alert, NAD, pleasant HEENT: Right forehead laceration with sutures in place, cdi. EOM's intact, pupils equal and round. Poor dentition. MMM.  Neck: Denies c-spine ttp. No step offs.  Card: Irregular rhythm with regular rate Pulm:  CTAB, no W/R/R, effort normal.  On room air Abd: Soft, ND, NT, +BS GU: Chronic SP tube in place with urine in bag Ext: Has already been seen by orthopedics. No gross deformities over joints of the LUE. He denies ttp over the LUE and has able rom. R shoulder with some mild ttp over the shoulder anteriorly. No gross deformities. No ttp over the clavicle on my exam. His wife reports that this was from the fall on 1/21.  He did receive x-rays at this time that were negative for fracture.  He also had CT chest done during this admission without any obvious fracture noted on images captured/report. He is able to flex his shoulder and denies any pain with movement. Remainder of the RUE is without ttp or gross deformities over joints. He has able rom of the elbow, wrist and hand. Abrasion over the dorsal aspect of the R hand is cdi. No ttp over the R knee, thigh, lower leg or ankle. R knee with known bursitis as  documented in ortho's notes. No ttp over the LLE. MAE's Psych: A&Ox3  Skin: no rashes noted, warm and dry  Lab Results:  Recent Labs    07/31/22 1655 08/01/22 0300  WBC 3.1* 3.9*  HGB 10.6*  11.2* 9.2*  HCT 34.9*  33.0* 30.0*  PLT 73* 67*   BMET Recent Labs    07/31/22 1655 08/01/22 0300  NA 137  140 138  K 4.9  5.0 4.2  CL 104  106 106  CO2 24 22  GLUCOSE 172*  165* 143*  BUN 30*  33* 27*  CREATININE 1.57*  1.60* 1.33*  CALCIUM 10.2 9.8   PT/INR Recent Labs    07/31/22 1655  LABPROT 25.1*  INR 2.3*   CMP     Component Value Date/Time   NA 138 08/01/2022 0300   NA 142 06/09/2022 0815   NA 144 09/17/2016 0742   K 4.2 08/01/2022 0300   K 4.1 09/17/2016 0742   CL 106 08/01/2022 0300   CO2 22 08/01/2022 0300   CO2 30 (H) 09/17/2016 0742   GLUCOSE 143 (H) 08/01/2022 0300   GLUCOSE 120 09/17/2016 0742   BUN 27 (H) 08/01/2022 0300   BUN 26 (A) 06/09/2022 0815   BUN 18.1 09/17/2016 0742   CREATININE 1.33 (H) 08/01/2022 0300   CREATININE 1.30 (H) 03/22/2019 0808   CREATININE 1.2  09/17/2016 0742   CALCIUM 9.8 08/01/2022 0300   CALCIUM 10.4 09/17/2016 0742   PROT 6.0 (L) 08/01/2022 0300   PROT 6.6 09/17/2016 0742   ALBUMIN 3.3 (L) 08/01/2022 0300   ALBUMIN 4.0 09/17/2016 0742   AST 21 08/01/2022 0300   AST 17 03/22/2019 0808   AST 22 09/17/2016 0742   ALT 11 08/01/2022 0300   ALT 15 03/22/2019 0808   ALT 21 09/17/2016 0742   ALKPHOS 80 08/01/2022 0300   ALKPHOS 77 09/17/2016 0742   BILITOT 1.1 08/01/2022 0300   BILITOT 1.2 03/22/2019 0808   BILITOT 1.38 (H) 09/17/2016 0742   GFRNONAA 51 (L) 08/01/2022 0300   GFRNONAA 50 (L) 03/22/2019 0808   GFRAA 72.70 05/25/2021 0000   GFRAA 58 (L) 03/22/2019 0808   Lipase     Component Value Date/Time   LIPASE 20 10/07/2016 1405    Studies/Results: CT HEAD WO CONTRAST (5MM)  Result Date: 08/01/2022 CLINICAL DATA:  Known hemorrhage EXAM: CT HEAD WITHOUT CONTRAST TECHNIQUE: Contiguous axial images  were obtained from the base of the skull through the vertex without intravenous contrast. RADIATION DOSE REDUCTION: This exam was performed according to the departmental dose-optimization program which includes automated exposure control, adjustment of the mA and/or kV according to patient size and/or use of iterative reconstruction technique. COMPARISON:  CT head 1 day prior FINDINGS: Brain: Small volume subarachnoid hemorrhage along the underside of the right temporal lobe is unchanged (5-43). There is no edema or mass effect. There is no other acute intracranial hemorrhage or extra-axial fluid collection. There is no evidence of acute infarct. Parenchymal volume is stable. The ventricles are stable in size. Gray-white differentiation is preserved. Pituitary and suprasellar region are normal. There is no mass lesion or midline shift. Vascular: There is calcification of the bilateral carotid siphons and vertebral arteries. Skull: Normal. Negative for fracture or focal lesion. Sinuses/Orbits: The paranasal sinuses are clear. Bilateral lens implants are in place. The globes and orbits are otherwise unremarkable. Other: Right frontal and left parietal scalp swelling is again seen. IMPRESSION: Stable small volume subarachnoid hemorrhage overlying the right temporal lobe. No new acute intracranial pathology. Electronically Signed   By: Valetta Mole M.D.   On: 08/01/2022 08:07   CT CHEST ABDOMEN PELVIS W CONTRAST  Result Date: 07/31/2022 CLINICAL DATA:  Polytrauma, blunt [id number] Trauma [id number] EXAM: CT CHEST, ABDOMEN, AND PELVIS WITH CONTRAST TECHNIQUE: Multidetector CT imaging of the chest, abdomen and pelvis was performed following the standard protocol during bolus administration of intravenous contrast. RADIATION DOSE REDUCTION: This exam was performed according to the departmental dose-optimization program which includes automated exposure control, adjustment of the mA and/or kV according to patient size and/or  use of iterative reconstruction technique. CONTRAST:  61m OMNIPAQUE IOHEXOL 350 MG/ML SOLN COMPARISON:  CT abdomen pelvis 06/10/2018, CT abdomen pelvis 06/17/2019 FINDINGS: CHEST: Cardiovascular: No aortic injury. The ascending thoracic aorta is enlarged in caliber measuring up to 4.3 cm. The descending thoracic aorta is normal in caliber. Severe atherosclerotic plaque. Aortic valve leaflet calcification. At least 2 vessel coronary artery calcification. The heart is mildly enlarged in size. No significant pericardial effusion. The main pulmonary artery is enlarged in caliber measuring up to 3.5 cm. No main pulmonary embolus. Mediastinum/Nodes: No pneumomediastinum. No mediastinal hematoma. The esophagus is unremarkable. The thyroid is unremarkable. The central airways are patent. No mediastinal, hilar, or axillary lymphadenopathy. Lungs/Pleura: Diffuse bronchial wall thickening. No focal consolidation. Left apical 7 mm subsolid pulmonary nodule (5:5). Slightly more inferior 5  mm solid left upper lobe pulmonary nodule (5:53). Right middle lobe 5 mm ground-glass airspace opacity (5:101). No pulmonary mass. No pulmonary contusion or laceration. No pneumatocele formation. Low-density right pleural fluid which may represent a trace pleural effusion versus chylothorax. No pneumothorax. No hemothorax. Musculoskeletal/Chest wall: No chest wall mass. No acute rib or sternal fracture. Please see separately dictated CT thoracolumbar spine 07/31/2022. ABDOMEN / PELVIS: Hepatobiliary: Not enlarged. No focal lesion. No laceration or subcapsular hematoma. The gallbladder is otherwise unremarkable with no radio-opaque gallstones. No biliary ductal dilatation. Pancreas: Normal pancreatic contour. No main pancreatic duct dilatation. Spleen: Not enlarged. No focal lesion. No laceration, subcapsular hematoma, or vascular injury. Adrenals/Urinary Tract: No nodularity bilaterally. Nonspecific bilateral perinephric fat stranding.  Bilateral kidneys enhance symmetrically. Interval increase in size of a right superior renal pole mass measuring at least 5 cm (from 3.7 cm) versus interval development of an adjacent new mass. Punctate left nephrolithiasis. No hydronephrosis. No contusion, laceration, or subcapsular hematoma. No injury to the vascular structures or collecting systems. No hydroureter. Suprapubic catheter terminates within in the collapsed urinary bladder lumen. Several calcified stones are noted within the gallbladder lumen measuring up to 8 mm. Stomach/Bowel: No small or large bowel wall thickening or dilatation. The appendix is not definitely identified with no inflammatory changes in the right lower quadrant to suggest acute appendicitis. Vasculature/Lymphatics: Severe atherosclerotic plaque. No abdominal aorta or iliac aneurysm. No active contrast extravasation or pseudoaneurysm. No abdominal, pelvic, inguinal lymphadenopathy. Reproductive: Prostate enlarged in size measuring up to 6.2 cm. Other: No simple free fluid ascites. No pneumoperitoneum. No hemoperitoneum. No mesenteric hematoma identified. No organized fluid collection. Musculoskeletal: No significant soft tissue hematoma. Age-indeterminate, possibly acute, displaced right S4 sacral ala fracture with extension to the right S4 and S5 sacral foramina as well as extension to the left S3 sacral ala and left S3 sacral foramina (7:136, 8: 46). Please see separately dictated CT thoracolumbar spine 07/31/2022. No extension to the sacroiliac joints. No pelvic diastasis. No other acute displaced pelvic fracture. Ports and Devices: None. IMPRESSION: 1. Age-indeterminate, possibly acute, displaced bilateral sacral ala fractures extending to the right S4 and S5 as well as left S3 sacral foramina. 2. No acute intrathoracic, intra-abdominal, intrapelvic traumatic injury. 3. Please see separately dictated CT thoracolumbar spine as well as CT head and C-spine 07/31/2022. Other imaging  findings of potential clinical significance: 1. Interval increase in size of a right superior renal pole mass measuring at least 5 cm (from 3.7 cm) versus interval development of an adjacent new mass. Findings suggestive of malignancy. When the patient is clinically stable and able to follow directions and hold their breath (preferably as an outpatient) further evaluation with dedicated MRI renal protocol should be considered. 2. 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 Recommend annual imaging followup by CTA or MRA. This recommendation follows 2010 ACCF/AHA/AATS/ACR/ASA/SCA/SCAI/SIR/STS/SVM Guidelines for the Diagnosis and Management of Patients with Thoracic Aortic Disease. Circulation. 2010; 121: S010-X323. Aortic aneurysm NOS (ICD10-I71.9) . 3. Mild cardiomegaly. 4. Enlarged main pulmonary artery suggestive of pulmonary hypertension. 5. Left apical 7 mm subsolid pulmonary nodule. Follow-up non-contrast CT recommended at 3-6 months to confirm persistence. If unchanged, and solid component remains <6 mm, annual CT is recommended until 5 years of stability has been established. If persistent these nodules should be considered highly suspicious if the solid component of the nodule is 6 mm or greater in size and enlarging. This recommendation follows the consensus statement: Guidelines for Management of Incidental Pulmonary Nodules Detected on CT Images:  From the Fleischner Society 2017; Radiology 2017; 284:228-243. 6. Low-density right pleural fluid which may represent a trace pleural effusion versus chylothorax. 7. Nonobstructive left nephrolithiasis. 8. Calcified stones within the urinary bladder lumen. Urinary bladder decompressed with suprapubic catheter in appropriate position. 9. Prostatomegaly. 10. Aortic Atherosclerosis (ICD10-I70.0) including coronary artery calcification and aortic valve leaflet calcifications-correlate for aortic stenosis. Electronically Signed   By: Iven Finn M.D.   On:  07/31/2022 18:45   CT HEAD WO CONTRAST  Result Date: 07/31/2022 CLINICAL DATA:  Trauma EXAM: CT HEAD WITHOUT CONTRAST CT CERVICAL SPINE WITHOUT CONTRAST CT THORACIC SPINE WITHOUT CONTRAST CT LUMBAR SPINE WITHOUT CONTRAST TECHNIQUE: Multidetector CT imaging of the head and cervical, thoracic, and lumbar spine was performed following the standard protocol without intravenous contrast. Multiplanar CT image reconstructions of the cervical spine were also generated. RADIATION DOSE REDUCTION: This exam was performed according to the departmental dose-optimization program which includes automated exposure control, adjustment of the mA and/or kV according to patient size and/or use of iterative reconstruction technique. COMPARISON:  CT Head 05/01/19 FINDINGS: CT HEAD FINDINGS Brain: No evidence of acute infarction, hydrocephalus, or mass lesion/mass effect. There is a subtle hyperdensity along the right tentorial leaflet (series 5, image 49), which is new compared to 05/01/2019, and favored to represent a small amount of extra-axial blood products. There is advanced generalized volume loss. Vascular: No hyperdense vessel or unexpected calcification. Skull: Soft tissue swelling along the right frontal scalp in the left parietal scalp. No evidence of underlying calvarial fracture. Sinuses/Orbits: Bilateral lens replacement. No evidence of mastoid or middle ear effusion. Paranasal sinuses are clear. Other: None CT CERVICAL, THORACIC, LUMBAR SPINE FINDINGS Alignment: Grade 1 anterolisthesis of C5 on C6.  Otherwise normal. Skull base and vertebrae: No evidence SP cervical spine fracture. Multilevel degenerative changes. With likely mild-to-moderate spinal canal stenosis at C3-C4. There has been posterior decompression at the T5-T8 level. No evidence of an acute thoracic spine fracture. There may be an acute fracture at the right L4 transverse process (series 2, image 54). There is an acute fracture through the S4 vertebral  body. Soft tissues and spinal canal: See separately dictated CT chest abdomen and pelvis for soft tissue findings in the chest abdomen and pelvis. Disc levels: Limitations: Assessment of the spinal canal limited due to CT technique. Within this limitation Cervical spine: Likely mild-to-moderate spinal canal stenosis at C3-C4. Thoracic spine: No evidence of high-grade spinal canal stenosis. Lumbar spine: Congenitally short pedicles. Severe spinal canal stenosis from the L1-L2 level to the L4-L5 levels. There is also severe bilateral neural foraminal stenosis from the L3-L4 to the L5-S1 levels. Upper chest: See separately dictated CT chest abdomen and pelvis. IMPRESSION: CT HEAD: 1. Subtle hyperdensity along the right tentorial leaflet, favored to represent a small amount of extra-axial blood products. Recommend follow up CT to ensure stability. 2. Right frontal and left parietal scalp soft tissue swelling. No evidence of underlying calvarial fracture. CT CERVICAL SPINE: No acute cervical spine fracture. CT THORACIC SPINE: No acute thoracic spine fracture. CT LUMBAR SPINE: 1. Acute fracture through the S4 vertebral body. 2. Possible acute fracture of the right L4 transverse process. 3. Severe spinal canal stenosis from the L1-L2 level to the L4-L5 levels. Severe bilateral neural foraminal stenosis from the L3-L4 to the L5-S1 levels. Electronically Signed   By: Marin Roberts M.D.   On: 07/31/2022 18:08   CT CERVICAL SPINE WO CONTRAST  Result Date: 07/31/2022 CLINICAL DATA:  Trauma EXAM: CT HEAD WITHOUT CONTRAST  CT CERVICAL SPINE WITHOUT CONTRAST CT THORACIC SPINE WITHOUT CONTRAST CT LUMBAR SPINE WITHOUT CONTRAST TECHNIQUE: Multidetector CT imaging of the head and cervical, thoracic, and lumbar spine was performed following the standard protocol without intravenous contrast. Multiplanar CT image reconstructions of the cervical spine were also generated. RADIATION DOSE REDUCTION: This exam was performed according to  the departmental dose-optimization program which includes automated exposure control, adjustment of the mA and/or kV according to patient size and/or use of iterative reconstruction technique. COMPARISON:  CT Head 05/01/19 FINDINGS: CT HEAD FINDINGS Brain: No evidence of acute infarction, hydrocephalus, or mass lesion/mass effect. There is a subtle hyperdensity along the right tentorial leaflet (series 5, image 49), which is new compared to 05/01/2019, and favored to represent a small amount of extra-axial blood products. There is advanced generalized volume loss. Vascular: No hyperdense vessel or unexpected calcification. Skull: Soft tissue swelling along the right frontal scalp in the left parietal scalp. No evidence of underlying calvarial fracture. Sinuses/Orbits: Bilateral lens replacement. No evidence of mastoid or middle ear effusion. Paranasal sinuses are clear. Other: None CT CERVICAL, THORACIC, LUMBAR SPINE FINDINGS Alignment: Grade 1 anterolisthesis of C5 on C6.  Otherwise normal. Skull base and vertebrae: No evidence SP cervical spine fracture. Multilevel degenerative changes. With likely mild-to-moderate spinal canal stenosis at C3-C4. There has been posterior decompression at the T5-T8 level. No evidence of an acute thoracic spine fracture. There may be an acute fracture at the right L4 transverse process (series 2, image 54). There is an acute fracture through the S4 vertebral body. Soft tissues and spinal canal: See separately dictated CT chest abdomen and pelvis for soft tissue findings in the chest abdomen and pelvis. Disc levels: Limitations: Assessment of the spinal canal limited due to CT technique. Within this limitation Cervical spine: Likely mild-to-moderate spinal canal stenosis at C3-C4. Thoracic spine: No evidence of high-grade spinal canal stenosis. Lumbar spine: Congenitally short pedicles. Severe spinal canal stenosis from the L1-L2 level to the L4-L5 levels. There is also severe  bilateral neural foraminal stenosis from the L3-L4 to the L5-S1 levels. Upper chest: See separately dictated CT chest abdomen and pelvis. IMPRESSION: CT HEAD: 1. Subtle hyperdensity along the right tentorial leaflet, favored to represent a small amount of extra-axial blood products. Recommend follow up CT to ensure stability. 2. Right frontal and left parietal scalp soft tissue swelling. No evidence of underlying calvarial fracture. CT CERVICAL SPINE: No acute cervical spine fracture. CT THORACIC SPINE: No acute thoracic spine fracture. CT LUMBAR SPINE: 1. Acute fracture through the S4 vertebral body. 2. Possible acute fracture of the right L4 transverse process. 3. Severe spinal canal stenosis from the L1-L2 level to the L4-L5 levels. Severe bilateral neural foraminal stenosis from the L3-L4 to the L5-S1 levels. Electronically Signed   By: Marin Roberts M.D.   On: 07/31/2022 18:08   CT T-SPINE NO CHARGE  Result Date: 07/31/2022 CLINICAL DATA:  Trauma EXAM: CT HEAD WITHOUT CONTRAST CT CERVICAL SPINE WITHOUT CONTRAST CT THORACIC SPINE WITHOUT CONTRAST CT LUMBAR SPINE WITHOUT CONTRAST TECHNIQUE: Multidetector CT imaging of the head and cervical, thoracic, and lumbar spine was performed following the standard protocol without intravenous contrast. Multiplanar CT image reconstructions of the cervical spine were also generated. RADIATION DOSE REDUCTION: This exam was performed according to the departmental dose-optimization program which includes automated exposure control, adjustment of the mA and/or kV according to patient size and/or use of iterative reconstruction technique. COMPARISON:  CT Head 05/01/19 FINDINGS: CT HEAD FINDINGS Brain: No evidence of  acute infarction, hydrocephalus, or mass lesion/mass effect. There is a subtle hyperdensity along the right tentorial leaflet (series 5, image 49), which is new compared to 05/01/2019, and favored to represent a small amount of extra-axial blood products. There is  advanced generalized volume loss. Vascular: No hyperdense vessel or unexpected calcification. Skull: Soft tissue swelling along the right frontal scalp in the left parietal scalp. No evidence of underlying calvarial fracture. Sinuses/Orbits: Bilateral lens replacement. No evidence of mastoid or middle ear effusion. Paranasal sinuses are clear. Other: None CT CERVICAL, THORACIC, LUMBAR SPINE FINDINGS Alignment: Grade 1 anterolisthesis of C5 on C6.  Otherwise normal. Skull base and vertebrae: No evidence SP cervical spine fracture. Multilevel degenerative changes. With likely mild-to-moderate spinal canal stenosis at C3-C4. There has been posterior decompression at the T5-T8 level. No evidence of an acute thoracic spine fracture. There may be an acute fracture at the right L4 transverse process (series 2, image 54). There is an acute fracture through the S4 vertebral body. Soft tissues and spinal canal: See separately dictated CT chest abdomen and pelvis for soft tissue findings in the chest abdomen and pelvis. Disc levels: Limitations: Assessment of the spinal canal limited due to CT technique. Within this limitation Cervical spine: Likely mild-to-moderate spinal canal stenosis at C3-C4. Thoracic spine: No evidence of high-grade spinal canal stenosis. Lumbar spine: Congenitally short pedicles. Severe spinal canal stenosis from the L1-L2 level to the L4-L5 levels. There is also severe bilateral neural foraminal stenosis from the L3-L4 to the L5-S1 levels. Upper chest: See separately dictated CT chest abdomen and pelvis. IMPRESSION: CT HEAD: 1. Subtle hyperdensity along the right tentorial leaflet, favored to represent a small amount of extra-axial blood products. Recommend follow up CT to ensure stability. 2. Right frontal and left parietal scalp soft tissue swelling. No evidence of underlying calvarial fracture. CT CERVICAL SPINE: No acute cervical spine fracture. CT THORACIC SPINE: No acute thoracic spine fracture.  CT LUMBAR SPINE: 1. Acute fracture through the S4 vertebral body. 2. Possible acute fracture of the right L4 transverse process. 3. Severe spinal canal stenosis from the L1-L2 level to the L4-L5 levels. Severe bilateral neural foraminal stenosis from the L3-L4 to the L5-S1 levels. Electronically Signed   By: Marin Roberts M.D.   On: 07/31/2022 18:08   CT L-SPINE NO CHARGE  Result Date: 07/31/2022 CLINICAL DATA:  Trauma EXAM: CT HEAD WITHOUT CONTRAST CT CERVICAL SPINE WITHOUT CONTRAST CT THORACIC SPINE WITHOUT CONTRAST CT LUMBAR SPINE WITHOUT CONTRAST TECHNIQUE: Multidetector CT imaging of the head and cervical, thoracic, and lumbar spine was performed following the standard protocol without intravenous contrast. Multiplanar CT image reconstructions of the cervical spine were also generated. RADIATION DOSE REDUCTION: This exam was performed according to the departmental dose-optimization program which includes automated exposure control, adjustment of the mA and/or kV according to patient size and/or use of iterative reconstruction technique. COMPARISON:  CT Head 05/01/19 FINDINGS: CT HEAD FINDINGS Brain: No evidence of acute infarction, hydrocephalus, or mass lesion/mass effect. There is a subtle hyperdensity along the right tentorial leaflet (series 5, image 49), which is new compared to 05/01/2019, and favored to represent a small amount of extra-axial blood products. There is advanced generalized volume loss. Vascular: No hyperdense vessel or unexpected calcification. Skull: Soft tissue swelling along the right frontal scalp in the left parietal scalp. No evidence of underlying calvarial fracture. Sinuses/Orbits: Bilateral lens replacement. No evidence of mastoid or middle ear effusion. Paranasal sinuses are clear. Other: None CT CERVICAL, THORACIC, LUMBAR SPINE FINDINGS  Alignment: Grade 1 anterolisthesis of C5 on C6.  Otherwise normal. Skull base and vertebrae: No evidence SP cervical spine fracture.  Multilevel degenerative changes. With likely mild-to-moderate spinal canal stenosis at C3-C4. There has been posterior decompression at the T5-T8 level. No evidence of an acute thoracic spine fracture. There may be an acute fracture at the right L4 transverse process (series 2, image 54). There is an acute fracture through the S4 vertebral body. Soft tissues and spinal canal: See separately dictated CT chest abdomen and pelvis for soft tissue findings in the chest abdomen and pelvis. Disc levels: Limitations: Assessment of the spinal canal limited due to CT technique. Within this limitation Cervical spine: Likely mild-to-moderate spinal canal stenosis at C3-C4. Thoracic spine: No evidence of high-grade spinal canal stenosis. Lumbar spine: Congenitally short pedicles. Severe spinal canal stenosis from the L1-L2 level to the L4-L5 levels. There is also severe bilateral neural foraminal stenosis from the L3-L4 to the L5-S1 levels. Upper chest: See separately dictated CT chest abdomen and pelvis. IMPRESSION: CT HEAD: 1. Subtle hyperdensity along the right tentorial leaflet, favored to represent a small amount of extra-axial blood products. Recommend follow up CT to ensure stability. 2. Right frontal and left parietal scalp soft tissue swelling. No evidence of underlying calvarial fracture. CT CERVICAL SPINE: No acute cervical spine fracture. CT THORACIC SPINE: No acute thoracic spine fracture. CT LUMBAR SPINE: 1. Acute fracture through the S4 vertebral body. 2. Possible acute fracture of the right L4 transverse process. 3. Severe spinal canal stenosis from the L1-L2 level to the L4-L5 levels. Severe bilateral neural foraminal stenosis from the L3-L4 to the L5-S1 levels. Electronically Signed   By: Marin Roberts M.D.   On: 07/31/2022 18:08   DG Knee 2 Views Right  Result Date: 07/31/2022 CLINICAL DATA:  Swelling, fall. EXAM: RIGHT KNEE - 1-2 VIEW COMPARISON:  Right knee x-ray 07/27/2022 FINDINGS: Small joint effusion  persists. There is prepatellar soft tissue swelling similar to the prior study. There is no acute fracture or dislocation. There is mediolateral compartment degenerative change and chondrocalcinosis similar to prior. Peripheral vascular calcifications are present. IMPRESSION: 1. No acute fracture or dislocation. 2. Small joint effusion and prepatellar soft tissue swelling similar to prior study. Electronically Signed   By: Ronney Asters M.D.   On: 07/31/2022 17:15   DG Chest Port 1 View  Result Date: 07/31/2022 CLINICAL DATA:  Trauma, fall. EXAM: PORTABLE CHEST 1 VIEW COMPARISON:  Chest x-ray 07/27/2022 FINDINGS: Heart is mildly enlarged, unchanged. The lungs are clear. There is no pleural effusion or pneumothorax. No acute fractures are seen. IMPRESSION: No acute cardiopulmonary process. Electronically Signed   By: Ronney Asters M.D.   On: 07/31/2022 17:14   DG Pelvis Portable  Result Date: 07/31/2022 CLINICAL DATA:  Trauma EXAM: PORTABLE PELVIS 1-2 VIEWS COMPARISON:  Pelvis x-ray 07/27/2022 FINDINGS: There is no evidence of pelvic fracture or diastasis. No pelvic bone lesions are seen. There are moderate degenerative changes of both hips similar to the prior study. IMPRESSION: No acute bony abnormality. Moderate degenerative changes of both hips. Electronically Signed   By: Ronney Asters M.D.   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 Reatha Armour. Repeat CTH today. Recs holding  anticoagulation. If repeat CT shows definitive hemorrhage, would recommend reversal of Coumadin. Looks like CTH showed stable small volume SAH. Discussed with NSGY, no need for reversal - hold coumadin x 2 weeks. Would recommend f/u with Cardiology as an outpatient to determine if he should discontinue this permanently given hx of recurrent falls. Relayed this to primary team.  B/l sacral ala fx with S4 vertebral body fx - Per Ortho and NSGY. NSGY recs no acute intervention, wbat, no bracing. Ortho recs WBAT BLE. No f/u unless become symptomatic in which case may see Dr. Mable Fill.  R L4 TP fx - Multimodal pain control. NSGY has seen.  R knee bursitis - Per ortho.  ABL anemia - Hgb 9.2. Does have orthopedic fx's and received IVF here. Recommend holding anticoagulation and trending hgb. VSS without tachycardia or hypotension. Doesn't look like he is on a BB here.  Right forehead laceration -  from fall on 1/21.  Dissolvable sutures (plain gut) R shoulder pain -reports after fall on 1/21.  X-rays at that time were negative for fracture.  Also had a CT scan of the chest done here without obvious fracture.  He does have able flexion of the shoulder without reported pain. Has already been seen by orthopedics.  Would defer to their team if needs any further w/u. Will also run by my MD.   Multiple medical problems including A-fib, dementia, hypertension, PVD, hyperlipidemia and CHF.  Also found to have UTI in setting of chronic SP catheter and started Rocephin - per primary Incidental finding - R superior renal pole mass. Also Aneurysmal ascending thoracic aorta (4.3cm). L apical 75m pulm nodule  Per primary. Defer to their team for further w/u and recs for f/u.   FEN - On HH diet - okay for diet from our standpoint VTE - SCDs, please hold Coumadin. Last INR 2.3. NSGY recs to hold anticoagulation for 2 weeks. Would recommend f/u with Cardiology as an outpatient to determine if he should discontinue this  permanently given hx of recurrent falls.  ID - Rocephin for UTI per primary.  Does not use any antibiotics otherwise from our standpoint Plan - Multimodal pain control.  Recommend TBI therapies (PT/OT/SLP) while in house. We will sign off. Please see recs as above. Dispo per primary. Please call back with any questions or concerns.   I reviewed nursing notes, ED provider notes, Consultant (Ortho, NSGY) notes, hospitalist notes, last 24 h vitals and pain scores, last 48 h intake and output, last 24 h labs and trends, and last 24 h imaging results. Discussed with NSGY and TRH  This care required moderate level of medical decision making.    LOS: 0 days    MJillyn Ledger, [facility] 08/01/2022, 9:27 AM Please see Amion for pager number during day hours 7:00am-4:30pm

## 2022-08-06 DIAGNOSIS — G468 Other vascular syndromes of brain in cerebrovascular diseases: Secondary | ICD-10-CM | POA: Diagnosis not present

## 2022-08-06 DIAGNOSIS — R41841 Cognitive communication deficit: Secondary | ICD-10-CM | POA: Diagnosis not present

## 2022-08-06 DIAGNOSIS — R262 Difficulty in walking, not elsewhere classified: Secondary | ICD-10-CM | POA: Diagnosis not present

## 2022-08-06 DIAGNOSIS — F01A0 Vascular dementia, mild, without behavioral disturbance, psychotic disturbance, mood disturbance, and anxiety: Secondary | ICD-10-CM | POA: Diagnosis not present

## 2022-08-06 DIAGNOSIS — M6281 Muscle weakness (generalized): Secondary | ICD-10-CM | POA: Diagnosis not present

## 2022-08-06 DIAGNOSIS — R1312 Dysphagia, oropharyngeal phase: Secondary | ICD-10-CM | POA: Diagnosis not present

## 2022-08-06 DIAGNOSIS — S066X0S Traumatic subarachnoid hemorrhage without loss of consciousness, sequela: Secondary | ICD-10-CM | POA: Diagnosis not present

## 2022-08-07 ENCOUNTER — Encounter: Payer: Self-pay | Admitting: Adult Health

## 2022-08-07 ENCOUNTER — Non-Acute Institutional Stay (SKILLED_NURSING_FACILITY): Payer: Medicare Other | Admitting: Adult Health

## 2022-08-07 DIAGNOSIS — S069X0A Unspecified intracranial injury without loss of consciousness, initial encounter: Secondary | ICD-10-CM | POA: Diagnosis not present

## 2022-08-07 DIAGNOSIS — S066X0S Traumatic subarachnoid hemorrhage without loss of consciousness, sequela: Secondary | ICD-10-CM | POA: Diagnosis not present

## 2022-08-07 DIAGNOSIS — F01A0 Vascular dementia, mild, without behavioral disturbance, psychotic disturbance, mood disturbance, and anxiety: Secondary | ICD-10-CM | POA: Diagnosis not present

## 2022-08-07 DIAGNOSIS — Z66 Do not resuscitate: Secondary | ICD-10-CM

## 2022-08-07 DIAGNOSIS — D61818 Other pancytopenia: Secondary | ICD-10-CM | POA: Diagnosis not present

## 2022-08-07 DIAGNOSIS — I4811 Longstanding persistent atrial fibrillation: Secondary | ICD-10-CM | POA: Diagnosis not present

## 2022-08-07 DIAGNOSIS — F0150 Vascular dementia without behavioral disturbance: Secondary | ICD-10-CM | POA: Diagnosis not present

## 2022-08-07 DIAGNOSIS — G468 Other vascular syndromes of brain in cerebrovascular diseases: Secondary | ICD-10-CM | POA: Diagnosis not present

## 2022-08-07 DIAGNOSIS — R278 Other lack of coordination: Secondary | ICD-10-CM | POA: Diagnosis not present

## 2022-08-07 DIAGNOSIS — M6389 Disorders of muscle in diseases classified elsewhere, multiple sites: Secondary | ICD-10-CM | POA: Diagnosis not present

## 2022-08-07 DIAGNOSIS — R296 Repeated falls: Secondary | ICD-10-CM

## 2022-08-07 DIAGNOSIS — M25561 Pain in right knee: Secondary | ICD-10-CM | POA: Diagnosis not present

## 2022-08-07 DIAGNOSIS — S3210XA Unspecified fracture of sacrum, initial encounter for closed fracture: Secondary | ICD-10-CM

## 2022-08-07 DIAGNOSIS — M6281 Muscle weakness (generalized): Secondary | ICD-10-CM | POA: Diagnosis not present

## 2022-08-07 DIAGNOSIS — I5032 Chronic diastolic (congestive) heart failure: Secondary | ICD-10-CM

## 2022-08-07 DIAGNOSIS — R262 Difficulty in walking, not elsewhere classified: Secondary | ICD-10-CM | POA: Diagnosis not present

## 2022-08-07 DIAGNOSIS — R41841 Cognitive communication deficit: Secondary | ICD-10-CM | POA: Diagnosis not present

## 2022-08-07 DIAGNOSIS — R1312 Dysphagia, oropharyngeal phase: Secondary | ICD-10-CM | POA: Diagnosis not present

## 2022-08-07 NOTE — Progress Notes (Incomplete Revision)
Location:  [place] Room Number: [place] of Service:  SNF [phone]) Provider:  [name], NP   [name], [name] [name], [name]  Extended Emergency Contact Information Primary Emergency Contact: Sewell,[name] Address: [address]          [place], [place] [address] [name] of [place] Phone: [phone] Mobile Phone: [phone] Relation: Spouse Secondary Emergency Contact: [name],[name] Address: [address]          [place], [place] [address] [place] Mobile Phone: [phone] Relation: Daughter  Code Status:  DNR Goals of care: Advanced Directive information    08/07/2022   10:21 AM  Advanced Directives  Does Patient Have a Medical Advance Directive? Yes  Type of Advance Directive Living will;Out of facility DNR (pink MOST or yellow form)  Does patient want to make changes to medical advance directive? No - Patient declined  Pre-existing out of facility DNR order (yellow form or pink MOST form) Yellow form placed in chart (order not valid for inpatient use);Pink MOST form placed in chart (order not valid for inpatient use)     Chief Complaint  Patient presents with  . Acute Visit    Swelling     HPI:  Pt is a 89 y.o. male seen today for an acute visit for swelling and weight gain.  Nurse reports that he has swelling in the scrotal area and legs. He also has gained 3.5 lbs in the past two weeks. He is not having any increased work of breathing, PND, etc. Received one extra dose of lasix 20 mg 2/1  He has had several falls in the past year with injury. Mechanical. Poor safety awareness.   In the ED 1/25 diagnosed with small SDH and coumadin placed on hold for two weeks. Neurosurgery was consulted and due to the small size coumadin was not reversed. Also sacral fx noted at S4 and TP  fracture at L4.  He was sent home with oxycodone and therapy orders. He also was given a dose of cephalosporin for possible right knee infection. Right knee xray 1/25 showed no acute fx but some soft tissue swelling and small joint effusion. Seen by ortho, can f/u outpatient for bursitis if needed.   Also he fell on 1/21 and was sent to the ED where he was treated for a laceration to his right forehead with sutures. CT of the head was done then as well and showed no acute bleed. He had right shoulder pain after this fall. Xray on the right shoulder was negative. The right knee also was painful and had swelling and bruising fairly significant with a skin tear. On 1/24 he was given prednisone for the right knee pain.   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 Coumadin on hold.   Urinary retention: due to BPH has foley due to chronic retention. Hx of recurrent UTI and renal mass no longer followed due to age, dementia, goals of care  Dementia: progressing over the past year. Needs reminders, cuing, assistance with ADLs. Has caregiver. Prior his recent fall he was walking with a walker.   Chronic pancytopenia: has seen Dr Alen Blew in the past. Possibly due  to etoh vs myelodysplasia. Due to goals of care no longer going out for apts.     Past Medical History:  Diagnosis Date  . Adult failure to thrive    Per incoming records from [facility]  . Allergic rhinitis    Per incoming records from [facility]  . Atrial fibrillation ([place])   . BPH (benign prostatic hyperplasia)    Per incoming records from [facility]  . Cancer of kidney [facility])    Right, Per incoming records from [facility]  . Cataract    Per incoming records from [facility]  . Cerebrovascular accident, late effects    Per incoming records from [facility]  . Chronic kidney disease, stage III (moderate) ([place])    Per incoming records from [facility]  . Claudication [facility])    Per incoming records from [facility]  . Cognitive impairment    Mild, Per incoming records from [facility]  . Constipation    Per incoming records from [facility]  . Dementia ([place])   . Diarrhea    Better off Aricept, Per incoming records from [facility]  . Diastolic dysfunction    on 2D Echo 07/2009 and 2016, Per incoming records from [facility]  . Diverticulosis    Mild, Per incoming records from [facility]  . Diverticulosis of colon (without mention of hemorrhage)   . ED (erectile dysfunction)    Per incoming records from [facility]  . History of Coumadin therapy    Per incoming records from [facility]  . History of echocardiogram 09/2014   Per incoming records from [facility]  . Hyperlipemia   . Hypertension   . Kidney mass    Per incoming records from [facility]  . Lower leg edema    [facility]  . Melanoma ([place])   . Neuropathy    Per incoming records from [facility]  . OA (osteoarthritis)    Per incoming records from [facility]  . Peripheral neuropathy    Per incoming records from [facility]  . Personal history of colonic polyps 1999 & 2004   adenomatous polyps  . Pulmonary arterial hypertension ([place])    Per incoming records from [facility]  . Rosacea    Per incoming records from [facility]  . Thrombocytopenia ([place]) 2017   Per incoming records from [facility]  . UTI (urinary tract infection)    Sepsis, Per incoming  records from [facility]  . Ventricular hypertrophy   . Walker as ambulation aid    Per incoming records from [employer] [facility]   Past Surgical History:  Procedure Laterality Date  . APPENDECTOMY     Per incoming records from [facility]  . CATARACT EXTRACTION     Per incoming records from [facility]  . IR CATHETER TUBE CHANGE  11/21/2019  . KNEE SURGERY     right  . PILONIDAL CYST DRAINAGE    . POLYPECTOMY     Per incoming records from [facility]  . schwanoma tumor lumbar spine    . SPINE SURGERY     tumor removed  . THORACIC 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, Per incoming records from [facility]  . TONSILLECTOMY AND ADENOIDECTOMY      Allergies  Allergen Reactions  . Penicillins Rash    Has patient had a PCN reaction causing immediate rash, facial/tongue/throat swelling, SOB or lightheadedness with hypotension: unknown Has patient had a PCN reaction causing severe rash involving mucus membranes or skin necrosis: unknown Has patient had a PCN reaction that required hospitalization : unknown Has patient had a PCN reaction occurring within the last 10 years: no If all of the above answers are "NO", then may proceed with Cephalosporin use.     Outpatient Encounter Medications as of 08/07/2022  Medication Sig  . acetaminophen (TYLENOL) 500 MG tablet Take 1,000 mg by mouth 2 (two) times daily.  . cholecalciferol (VITAMIN D3) 25 MCG (1000 UT) tablet Take 2,000 Units by mouth daily.   . Cranberry 400 MG CAPS Take 400 mg by mouth every morning. 400 mg, oral, Once a morning  . DULoxetine (CYMBALTA) 20 MG capsule Take 20 mg by mouth daily.  . furosemide (LASIX) 20 MG tablet Take 2 tablets (40 mg total) by mouth daily. Take extra '20mg'$  for weight gain of 2-3lbs in 1 day or 5 lbs in 1 week  . gabapentin (NEURONTIN) 100 MG capsule Take 200 mg by mouth at bedtime.  .  gabapentin (NEURONTIN) 100 MG capsule Take 200 mg by mouth daily. In the morning 8 am-11 am, see other listing for bedtime dosing  . ketoconazole (NIZORAL) 2 % shampoo Apply 1 Application topically 2 (two) times a week.  . polyethylene glycol (MIRALAX / GLYCOLAX) 17 g packet Take 17 g by mouth daily.  . potassium chloride (MICRO-K) 10 MEQ CR capsule Take 10 mEq by mouth every Tuesday, Thursday, and Saturday at 6 PM. Every Tues., Thurs., Sat. 7:00 am - 3:00 pm, per [facility].  Marland Kitchen triamcinolone lotion (KENALOG) 0.1 % Apply 1 application. topically 2 (two) times daily as needed (rash on back).  Derrill Memo ON 08/16/2022] warfarin (COUMADIN) 4 MG tablet Take 1 tablet (4 mg total) by mouth daily. Once a day on Mon, Wed, Fri, Sat 4:00 pm - 7:00 pm (Patient not taking: Reported on 08/07/2022)  . [START ON 08/16/2022] warfarin (COUMADIN) 5 MG tablet Take 1 tablet (5 mg total) by mouth daily. Give PO every Sun, Tues, Thurs 4:00 pm - 7:00 pm (Patient not taking: Reported on 08/07/2022)  . [DISCONTINUED] predniSONE (DELTASONE) 20 MG tablet Take 20 mg by mouth daily.   No facility-administered encounter medications on file as of 08/07/2022.    Review of Systems  Constitutional:  Positive for activity change. Negative for appetite change, chills, diaphoresis, fatigue, fever and unexpected weight change.  Respiratory:  Negative for cough, shortness of breath, wheezing and stridor.   Cardiovascular:  Positive for leg swelling. Negative for chest pain and palpitations.  Gastrointestinal:  Negative for abdominal distention, abdominal pain, constipation and diarrhea.  Genitourinary:  Negative for difficulty urinating and dysuria.  Musculoskeletal:  Positive for gait problem. Negative for arthralgias, back pain, joint swelling and myalgias.  Skin:  Positive for color change and wound.  Neurological:  Negative for dizziness, seizures, syncope, facial asymmetry, speech difficulty, weakness and headaches.   Hematological:  Negative for adenopathy. Does not bruise/bleed easily.  Psychiatric/Behavioral:  Positive for confusion. Negative for agitation and behavioral problems.     Immunization History  Administered Date(s) Administered  . DTaP 01/20/2022  . Influenza, High Dose Seasonal PF 05/04/2020, 04/10/2021  . Influenza-Unspecified 04/17/2014, 05/01/2016, 04/15/2019, 04/11/2022  . Moderna Covid-19 Vaccine Bivalent Booster 58yr & up 04/17/2021  . Moderna SARS-COV2 Booster Vaccination 05/17/2020,  05/12/2022  . Moderna Sars-Covid-2 Vaccination 07/12/2019, 08/09/2019, 11/03/2021  . Pneumococcal Conjugate-13 04/20/2014  . Pneumococcal Polysaccharide-23 07/08/2003, 03/31/2011  . Pneumococcal-Unspecified 01/26/2004  . Td 08/23/2007  . Tdap 01/07/2012, 01/20/2022  . Zoster Recombinat (Shingrix) 10/05/2017, 12/09/2017  . Zoster, Live 07/17/2005   Pertinent  Health Maintenance Due  Topic Date Due  . INFLUENZA VACCINE  Completed      12/14/2021    8:00 AM 12/15/2021    9:00 AM 12/16/2021   10:00 AM 05/27/2022    1:32 PM 07/30/2022    2:31 PM  Fall Risk  Falls in the past year?    0 0  Was there an injury with Fall?    0 0  Fall Risk Category Calculator    0 0  Fall Risk Category (Retired)    Low   (RETIRED) Patient Fall Risk Level High fall risk High fall risk High fall risk Low fall risk   Patient at Risk for Falls Due to     History of fall(s)  Fall risk Follow up     Falls evaluation completed   Functional Status Survey:    Vitals:   08/07/22 1019  BP: 135/66  Temp: (!) 97.3 F (36.3 C)  Weight: 264 lb 8 oz (120 kg)  Height: '6\' 7"'$  (2.007 m)   Body mass index is 29.8 kg/m.   Physical Exam Vitals and nursing note reviewed.  Constitutional:      General: He is not in acute distress.    Appearance: He is not diaphoretic.  HENT:     Head:     Comments: Bruising to right forehead.     Mouth/Throat:     Mouth: Mucous membranes are moist.     Pharynx: Oropharynx is  clear.  Eyes:     Conjunctiva/sclera: Conjunctivae normal.     Pupils: Pupils are equal, round, and reactive to light.  Neck:     Thyroid: No thyromegaly.     Vascular: No JVD.     Trachea: No tracheal deviation.  Cardiovascular:     Rate and Rhythm: Normal rate. Rhythm irregular.     Heart sounds: No murmur heard. Pulmonary:     Effort: Pulmonary effort is normal. No respiratory distress.     Breath sounds: Normal breath sounds. No wheezing.     Comments: Chronic cyanosis.  Abdominal:     General: Bowel sounds are normal. There is no distension.     Palpations: Abdomen is soft.     Tenderness: There is no abdominal tenderness.  Genitourinary:    Comments: Dark urine in s/p bag S/p site CDI Musculoskeletal:        General: Swelling (right knee with erythema, bruising, mild warmth.) present.     Right lower leg: Edema (+3) present.     Left lower leg: Edema (+2) present.  Lymphadenopathy:     Cervical: No cervical adenopathy.  Skin:    General: Skin is warm and dry.  Neurological:     General: No focal deficit present.     Mental Status: He is alert. Mental status is at baseline.     Cranial Nerves: No cranial nerve deficit.    Labs reviewed: Recent Labs    07/27/22 1503 07/31/22 1655 08/01/22 0300  NA 140 137  140 138  K 4.5 4.9  5.0 4.2  CL 107 104  106 106  CO2 '26 24 22  '$ GLUCOSE 107* 172*  165* 143*  BUN 26* 30*  33* 27*  CREATININE 1.54* 1.57*  1.60* 1.33*  CALCIUM 10.4* 10.2 9.8   Recent Labs    07/27/22 1503 07/31/22 1655 08/01/22 0300  AST 42* 25 21  ALT '8 11 11  '$ ALKPHOS 100 96 80  BILITOT 0.9 1.3* 1.1  PROT 6.7 6.7 6.0*  ALBUMIN 3.8 3.9 3.3*   Recent Labs    12/11/21 2010 12/12/21 0650 05/05/22 0000 05/23/22 1657 06/09/22 0815 07/27/22 1503 07/31/22 1655 08/01/22 0300  WBC 5.1   < > 2.1 2.3   < > 2.5* 3.1* 3.9*  NEUTROABS 4.1  --  1.00 1.30  --   --   --   --   HGB 13.2   < > 11.9* 10.5*   < > 11.3* 10.6*  11.2* 9.2*  HCT 43.1    < > 37* 33*   < > 38.9* 34.9*  33.0* 30.0*  MCV 90.5   < >  --   --   --  94.2 91.8 90.4  PLT 88*   < > 77* 18*   < > 74* 73* 67*   < > = values in this interval not displayed.   Lab Results  Component Value Date   TSH 0.84 05/19/2019   Lab Results  Component Value Date   HGBA1C 6 08/22/2019   Lab Results  Component Value Date   CHOL 129 08/09/2020   HDL 32 (A) 08/09/2020   LDLCALC 80 08/09/2020   TRIG 86 08/09/2020    Significant Diagnostic Results in last 30 days:  CT HEAD WO CONTRAST (5MM)  Result Date: 08/01/2022 CLINICAL DATA:  Known hemorrhage EXAM: CT HEAD WITHOUT CONTRAST TECHNIQUE: Contiguous axial images were obtained from the base of the skull through the vertex without intravenous contrast. RADIATION DOSE REDUCTION: This exam was performed according to the departmental dose-optimization program which includes automated exposure control, adjustment of the mA and/or kV according to patient size and/or use of iterative reconstruction technique. COMPARISON:  CT head 1 day prior FINDINGS: Brain: Small volume subarachnoid hemorrhage along the underside of the right temporal lobe is unchanged (5-43). There is no edema or mass effect. There is no other acute intracranial hemorrhage or extra-axial fluid collection. There is no evidence of acute infarct. Parenchymal volume is stable. The ventricles are stable in size. Gray-white differentiation is preserved. Pituitary and suprasellar region are normal. There is no mass lesion or midline shift. Vascular: There is calcification of the bilateral carotid siphons and vertebral arteries. Skull: Normal. Negative for fracture or focal lesion. Sinuses/Orbits: The paranasal sinuses are clear. Bilateral lens implants are in place. The globes and orbits are otherwise unremarkable. Other: Right frontal and left parietal scalp swelling is again seen. IMPRESSION: Stable small volume subarachnoid hemorrhage overlying the right temporal lobe. No new acute  intracranial pathology. Electronically Signed   By: Valetta Mole M.D.   On: 08/01/2022 08:07   CT CHEST ABDOMEN PELVIS W CONTRAST  Result Date: 07/31/2022 CLINICAL DATA:  Polytrauma, blunt [id number] Trauma [id number] EXAM: CT CHEST, ABDOMEN, AND PELVIS WITH CONTRAST TECHNIQUE: Multidetector CT imaging of the chest, abdomen and pelvis was performed following the standard protocol during bolus administration of intravenous contrast. RADIATION DOSE REDUCTION: This exam was performed according to the departmental dose-optimization program which includes automated exposure control, adjustment of the mA and/or kV according to patient size and/or use of iterative reconstruction technique. CONTRAST:  66m OMNIPAQUE IOHEXOL 350 MG/ML SOLN COMPARISON:  CT abdomen pelvis 06/10/2018, CT abdomen pelvis 06/17/2019 FINDINGS: CHEST: Cardiovascular: No aortic injury. The  ascending thoracic aorta is enlarged in caliber measuring up to 4.3 cm. The descending thoracic aorta is normal in caliber. Severe atherosclerotic plaque. Aortic valve leaflet calcification. At least 2 vessel coronary artery calcification. The heart is mildly enlarged in size. No significant pericardial effusion. The main pulmonary artery is enlarged in caliber measuring up to 3.5 cm. No main pulmonary embolus. Mediastinum/Nodes: No pneumomediastinum. No mediastinal hematoma. The esophagus is unremarkable. The thyroid is unremarkable. The central airways are patent. No mediastinal, hilar, or axillary lymphadenopathy. Lungs/Pleura: Diffuse bronchial wall thickening. No focal consolidation. Left apical 7 mm subsolid pulmonary nodule (5:5). Slightly more inferior 5 mm solid left upper lobe pulmonary nodule (5:53). Right middle lobe 5 mm ground-glass airspace opacity (5:101). No pulmonary mass. No pulmonary contusion or laceration. No pneumatocele formation. Low-density right pleural fluid which may represent a trace pleural effusion versus chylothorax. No pneumothorax. No  hemothorax. Musculoskeletal/Chest wall: No chest wall mass. No acute rib or sternal fracture. Please see separately dictated CT thoracolumbar spine 07/31/2022. ABDOMEN / PELVIS: Hepatobiliary: Not enlarged. No focal lesion. No laceration or subcapsular hematoma. The gallbladder is otherwise unremarkable with no radio-opaque gallstones. No biliary ductal dilatation. Pancreas: Normal pancreatic contour. No main pancreatic duct dilatation. Spleen: Not enlarged. No focal lesion. No laceration, subcapsular hematoma, or vascular injury. Adrenals/Urinary Tract: No nodularity bilaterally. Nonspecific bilateral perinephric fat stranding. Bilateral kidneys enhance symmetrically. Interval increase in size of a right superior renal pole mass measuring at least 5 cm (from 3.7 cm) versus interval development of an adjacent new mass. Punctate left nephrolithiasis. No hydronephrosis. No contusion, laceration, or subcapsular hematoma. No injury to the vascular structures or collecting systems. No hydroureter. Suprapubic catheter terminates within in the collapsed urinary bladder lumen. Several calcified stones are noted within the gallbladder lumen measuring up to 8 mm. Stomach/Bowel: No small or large bowel wall thickening or dilatation. The appendix is not definitely identified with no inflammatory changes in the right lower quadrant to suggest acute appendicitis. Vasculature/Lymphatics: Severe atherosclerotic plaque. No abdominal aorta or iliac aneurysm. No active contrast extravasation or pseudoaneurysm. No abdominal, pelvic, inguinal lymphadenopathy. Reproductive: Prostate enlarged in size measuring up to 6.2 cm. Other: No simple free fluid ascites. No pneumoperitoneum. No hemoperitoneum. No mesenteric hematoma identified. No organized fluid collection. Musculoskeletal: No significant soft tissue hematoma. Age-indeterminate, possibly acute, displaced right S4 sacral ala fracture with extension to the right S4 and S5 sacral  foramina as well as extension to the left S3 sacral ala and left S3 sacral foramina (7:136, 8: 46). Please see separately dictated CT thoracolumbar spine 07/31/2022. No extension to the sacroiliac joints. No pelvic diastasis. No other acute displaced pelvic fracture. Ports and Devices: None. IMPRESSION: 1. Age-indeterminate, possibly acute, displaced bilateral sacral ala fractures extending to the right S4 and S5 as well as left S3 sacral foramina. 2. No acute intrathoracic, intra-abdominal, intrapelvic traumatic injury. 3. Please see separately dictated CT thoracolumbar spine as well as CT head and C-spine 07/31/2022. Other imaging findings of potential clinical significance: 1. Interval increase in size of a right superior renal pole mass measuring at least 5 cm (from 3.7 cm) versus interval development of an adjacent new mass. Findings suggestive of malignancy. When the patient is clinically stable and able to follow directions and hold their breath (preferably as an outpatient) further evaluation with dedicated MRI renal protocol should be considered. 2. 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 Recommend annual imaging followup by CTA or MRA. This recommendation follows 2010 ACCF/AHA/AATS/ACR/ASA/SCA/SCAI/SIR/STS/SVM Guidelines for the Diagnosis and Management  of Patients with Thoracic Aortic Disease. Circulation. 2010; 121: Z610-R604. Aortic aneurysm NOS (ICD10-I71.9) . 3. Mild cardiomegaly. 4. Enlarged main pulmonary artery suggestive of pulmonary hypertension. 5. Left apical 7 mm subsolid pulmonary nodule. Follow-up non-contrast CT recommended at 3-6 months to confirm persistence. If unchanged, and solid component remains <6 mm, annual CT is recommended until 5 years of stability has been established. If persistent these nodules should be considered highly suspicious if the solid component of the nodule is 6 mm or greater in size and enlarging. This recommendation follows the consensus statement:  Guidelines for Management of Incidental Pulmonary Nodules Detected on CT Images: From the Fleischner Society 2017; Radiology 2017; 284:228-243. 6. Low-density right pleural fluid which may represent a trace pleural effusion versus chylothorax. 7. Nonobstructive left nephrolithiasis. 8. Calcified stones within the urinary bladder lumen. Urinary bladder decompressed with suprapubic catheter in appropriate position. 9. Prostatomegaly. 10. Aortic Atherosclerosis (ICD10-I70.0) including coronary artery calcification and aortic valve leaflet calcifications-correlate for aortic stenosis. Electronically Signed   By: Iven Finn M.D.   On: 07/31/2022 18:45   CT HEAD WO CONTRAST  Result Date: 07/31/2022 CLINICAL DATA:  Trauma EXAM: CT HEAD WITHOUT CONTRAST CT CERVICAL SPINE WITHOUT CONTRAST CT THORACIC SPINE WITHOUT CONTRAST CT LUMBAR SPINE WITHOUT CONTRAST TECHNIQUE: Multidetector CT imaging of the head and cervical, thoracic, and lumbar spine was performed following the standard protocol without intravenous contrast. Multiplanar CT image reconstructions of the cervical spine were also generated. RADIATION DOSE REDUCTION: This exam was performed according to the departmental dose-optimization program which includes automated exposure control, adjustment of the mA and/or kV according to patient size and/or use of iterative reconstruction technique. COMPARISON:  CT Head 05/01/19 FINDINGS: CT HEAD FINDINGS Brain: No evidence of acute infarction, hydrocephalus, or mass lesion/mass effect. There is a subtle hyperdensity along the right tentorial leaflet (series 5, image 49), which is new compared to 05/01/2019, and favored to represent a small amount of extra-axial blood products. There is advanced generalized volume loss. Vascular: No hyperdense vessel or unexpected calcification. Skull: Soft tissue swelling along the right frontal scalp in the left parietal scalp. No evidence of underlying calvarial fracture.  Sinuses/Orbits: Bilateral lens replacement. No evidence of mastoid or middle ear effusion. Paranasal sinuses are clear. Other: None CT CERVICAL, THORACIC, LUMBAR SPINE FINDINGS Alignment: Grade 1 anterolisthesis of C5 on C6.  Otherwise normal. Skull base and vertebrae: No evidence SP cervical spine fracture. Multilevel degenerative changes. With likely mild-to-moderate spinal canal stenosis at C3-C4. There has been posterior decompression at the T5-T8 level. No evidence of an acute thoracic spine fracture. There may be an acute fracture at the right L4 transverse process (series 2, image 54). There is an acute fracture through the S4 vertebral body. Soft tissues and spinal canal: See separately dictated CT chest abdomen and pelvis for soft tissue findings in the chest abdomen and pelvis. Disc levels: Limitations: Assessment of the spinal canal limited due to CT technique. Within this limitation Cervical spine: Likely mild-to-moderate spinal canal stenosis at C3-C4. Thoracic spine: No evidence of high-grade spinal canal stenosis. Lumbar spine: Congenitally short pedicles. Severe spinal canal stenosis from the L1-L2 level to the L4-L5 levels. There is also severe bilateral neural foraminal stenosis from the L3-L4 to the L5-S1 levels. Upper chest: See separately dictated CT chest abdomen and pelvis. IMPRESSION: CT HEAD: 1. Subtle hyperdensity along the right tentorial leaflet, favored to represent a small amount of extra-axial blood products. Recommend follow up CT to ensure stability. 2. Right frontal and left  parietal scalp soft tissue swelling. No evidence of underlying calvarial fracture. CT CERVICAL SPINE: No acute cervical spine fracture. CT THORACIC SPINE: No acute thoracic spine fracture. CT LUMBAR SPINE: 1. Acute fracture through the S4 vertebral body. 2. Possible acute fracture of the right L4 transverse process. 3. Severe spinal canal stenosis from the L1-L2 level to the L4-L5 levels. Severe bilateral neural  foraminal stenosis from the L3-L4 to the L5-S1 levels. Electronically Signed   By: Marin Roberts M.D.   On: 07/31/2022 18:08   CT CERVICAL SPINE WO CONTRAST  Result Date: 07/31/2022 CLINICAL DATA:  Trauma EXAM: CT HEAD WITHOUT CONTRAST CT CERVICAL SPINE WITHOUT CONTRAST CT THORACIC SPINE WITHOUT CONTRAST CT LUMBAR SPINE WITHOUT CONTRAST TECHNIQUE: Multidetector CT imaging of the head and cervical, thoracic, and lumbar spine was performed following the standard protocol without intravenous contrast. Multiplanar CT image reconstructions of the cervical spine were also generated. RADIATION DOSE REDUCTION: This exam was performed according to the departmental dose-optimization program which includes automated exposure control, adjustment of the mA and/or kV according to patient size and/or use of iterative reconstruction technique. COMPARISON:  CT Head 05/01/19 FINDINGS: CT HEAD FINDINGS Brain: No evidence of acute infarction, hydrocephalus, or mass lesion/mass effect. There is a subtle hyperdensity along the right tentorial leaflet (series 5, image 49), which is new compared to 05/01/2019, and favored to represent a small amount of extra-axial blood products. There is advanced generalized volume loss. Vascular: No hyperdense vessel or unexpected calcification. Skull: Soft tissue swelling along the right frontal scalp in the left parietal scalp. No evidence of underlying calvarial fracture. Sinuses/Orbits: Bilateral lens replacement. No evidence of mastoid or middle ear effusion. Paranasal sinuses are clear. Other: None CT CERVICAL, THORACIC, LUMBAR SPINE FINDINGS Alignment: Grade 1 anterolisthesis of C5 on C6.  Otherwise normal. Skull base and vertebrae: No evidence SP cervical spine fracture. Multilevel degenerative changes. With likely mild-to-moderate spinal canal stenosis at C3-C4. There has been posterior decompression at the T5-T8 level. No evidence of an acute thoracic spine fracture. There may be an acute  fracture at the right L4 transverse process (series 2, image 54). There is an acute fracture through the S4 vertebral body. Soft tissues and spinal canal: See separately dictated CT chest abdomen and pelvis for soft tissue findings in the chest abdomen and pelvis. Disc levels: Limitations: Assessment of the spinal canal limited due to CT technique. Within this limitation Cervical spine: Likely mild-to-moderate spinal canal stenosis at C3-C4. Thoracic spine: No evidence of high-grade spinal canal stenosis. Lumbar spine: Congenitally short pedicles. Severe spinal canal stenosis from the L1-L2 level to the L4-L5 levels. There is also severe bilateral neural foraminal stenosis from the L3-L4 to the L5-S1 levels. Upper chest: See separately dictated CT chest abdomen and pelvis. IMPRESSION: CT HEAD: 1. Subtle hyperdensity along the right tentorial leaflet, favored to represent a small amount of extra-axial blood products. Recommend follow up CT to ensure stability. 2. Right frontal and left parietal scalp soft tissue swelling. No evidence of underlying calvarial fracture. CT CERVICAL SPINE: No acute cervical spine fracture. CT THORACIC SPINE: No acute thoracic spine fracture. CT LUMBAR SPINE: 1. Acute fracture through the S4 vertebral body. 2. Possible acute fracture of the right L4 transverse process. 3. Severe spinal canal stenosis from the L1-L2 level to the L4-L5 levels. Severe bilateral neural foraminal stenosis from the L3-L4 to the L5-S1 levels. Electronically Signed   By: Marin Roberts M.D.   On: 07/31/2022 18:08   CT T-SPINE NO CHARGE  Result Date: 07/31/2022 CLINICAL DATA:  Trauma EXAM: CT HEAD WITHOUT CONTRAST CT CERVICAL SPINE WITHOUT CONTRAST CT THORACIC SPINE WITHOUT CONTRAST CT LUMBAR SPINE WITHOUT CONTRAST TECHNIQUE: Multidetector CT imaging of the head and cervical, thoracic, and lumbar spine was performed following the standard protocol without intravenous contrast. Multiplanar CT image  reconstructions of the cervical spine were also generated. RADIATION DOSE REDUCTION: This exam was performed according to the departmental dose-optimization program which includes automated exposure control, adjustment of the mA and/or kV according to patient size and/or use of iterative reconstruction technique. COMPARISON:  CT Head 05/01/19 FINDINGS: CT HEAD FINDINGS Brain: No evidence of acute infarction, hydrocephalus, or mass lesion/mass effect. There is a subtle hyperdensity along the right tentorial leaflet (series 5, image 49), which is new compared to 05/01/2019, and favored to represent a small amount of extra-axial blood products. There is advanced generalized volume loss. Vascular: No hyperdense vessel or unexpected calcification. Skull: Soft tissue swelling along the right frontal scalp in the left parietal scalp. No evidence of underlying calvarial fracture. Sinuses/Orbits: Bilateral lens replacement. No evidence of mastoid or middle ear effusion. Paranasal sinuses are clear. Other: None CT CERVICAL, THORACIC, LUMBAR SPINE FINDINGS Alignment: Grade 1 anterolisthesis of C5 on C6.  Otherwise normal. Skull base and vertebrae: No evidence SP cervical spine fracture. Multilevel degenerative changes. With likely mild-to-moderate spinal canal stenosis at C3-C4. There has been posterior decompression at the T5-T8 level. No evidence of an acute thoracic spine fracture. There may be an acute fracture at the right L4 transverse process (series 2, image 54). There is an acute fracture through the S4 vertebral body. Soft tissues and spinal canal: See separately dictated CT chest abdomen and pelvis for soft tissue findings in the chest abdomen and pelvis. Disc levels: Limitations: Assessment of the spinal canal limited due to CT technique. Within this limitation Cervical spine: Likely mild-to-moderate spinal canal stenosis at C3-C4. Thoracic spine: No evidence of high-grade spinal canal stenosis. Lumbar spine:  Congenitally short pedicles. Severe spinal canal stenosis from the L1-L2 level to the L4-L5 levels. There is also severe bilateral neural foraminal stenosis from the L3-L4 to the L5-S1 levels. Upper chest: See separately dictated CT chest abdomen and pelvis. IMPRESSION: CT HEAD: 1. Subtle hyperdensity along the right tentorial leaflet, favored to represent a small amount of extra-axial blood products. Recommend follow up CT to ensure stability. 2. Right frontal and left parietal scalp soft tissue swelling. No evidence of underlying calvarial fracture. CT CERVICAL SPINE: No acute cervical spine fracture. CT THORACIC SPINE: No acute thoracic spine fracture. CT LUMBAR SPINE: 1. Acute fracture through the S4 vertebral body. 2. Possible acute fracture of the right L4 transverse process. 3. Severe spinal canal stenosis from the L1-L2 level to the L4-L5 levels. Severe bilateral neural foraminal stenosis from the L3-L4 to the L5-S1 levels. Electronically Signed   By: Marin Roberts M.D.   On: 07/31/2022 18:08   CT L-SPINE NO CHARGE  Result Date: 07/31/2022 CLINICAL DATA:  Trauma EXAM: CT HEAD WITHOUT CONTRAST CT CERVICAL SPINE WITHOUT CONTRAST CT THORACIC SPINE WITHOUT CONTRAST CT LUMBAR SPINE WITHOUT CONTRAST TECHNIQUE: Multidetector CT imaging of the head and cervical, thoracic, and lumbar spine was performed following the standard protocol without intravenous contrast. Multiplanar CT image reconstructions of the cervical spine were also generated. RADIATION DOSE REDUCTION: This exam was performed according to the departmental dose-optimization program which includes automated exposure control, adjustment of the mA and/or kV according to patient size and/or use of iterative reconstruction technique. COMPARISON:  CT Head 05/01/19 FINDINGS: CT HEAD FINDINGS Brain: No evidence of acute infarction, hydrocephalus, or mass lesion/mass effect. There is a subtle hyperdensity along the right tentorial leaflet (series 5, image  49), which is new compared to 05/01/2019, and favored to represent a small amount of extra-axial blood products. There is advanced generalized volume loss. Vascular: No hyperdense vessel or unexpected calcification. Skull: Soft tissue swelling along the right frontal scalp in the left parietal scalp. No evidence of underlying calvarial fracture. Sinuses/Orbits: Bilateral lens replacement. No evidence of mastoid or middle ear effusion. Paranasal sinuses are clear. Other: None CT CERVICAL, THORACIC, LUMBAR SPINE FINDINGS Alignment: Grade 1 anterolisthesis of C5 on C6.  Otherwise normal. Skull base and vertebrae: No evidence SP cervical spine fracture. Multilevel degenerative changes. With likely mild-to-moderate spinal canal stenosis at C3-C4. There has been posterior decompression at the T5-T8 level. No evidence of an acute thoracic spine fracture. There may be an acute fracture at the right L4 transverse process (series 2, image 54). There is an acute fracture through the S4 vertebral body. Soft tissues and spinal canal: See separately dictated CT chest abdomen and pelvis for soft tissue findings in the chest abdomen and pelvis. Disc levels: Limitations: Assessment of the spinal canal limited due to CT technique. Within this limitation Cervical spine: Likely mild-to-moderate spinal canal stenosis at C3-C4. Thoracic spine: No evidence of high-grade spinal canal stenosis. Lumbar spine: Congenitally short pedicles. Severe spinal canal stenosis from the L1-L2 level to the L4-L5 levels. There is also severe bilateral neural foraminal stenosis from the L3-L4 to the L5-S1 levels. Upper chest: See separately dictated CT chest abdomen and pelvis. IMPRESSION: CT HEAD: 1. Subtle hyperdensity along the right tentorial leaflet, favored to represent a small amount of extra-axial blood products. Recommend follow up CT to ensure stability. 2. Right frontal and left parietal scalp soft tissue swelling. No evidence of underlying  calvarial fracture. CT CERVICAL SPINE: No acute cervical spine fracture. CT THORACIC SPINE: No acute thoracic spine fracture. CT LUMBAR SPINE: 1. Acute fracture through the S4 vertebral body. 2. Possible acute fracture of the right L4 transverse process. 3. Severe spinal canal stenosis from the L1-L2 level to the L4-L5 levels. Severe bilateral neural foraminal stenosis from the L3-L4 to the L5-S1 levels. Electronically Signed   By: Marin Roberts M.D.   On: 07/31/2022 18:08   DG Knee 2 Views Right  Result Date: 07/31/2022 CLINICAL DATA:  Swelling, fall. EXAM: RIGHT KNEE - 1-2 VIEW COMPARISON:  Right knee x-ray 07/27/2022 FINDINGS: Small joint effusion persists. There is prepatellar soft tissue swelling similar to the prior study. There is no acute fracture or dislocation. There is mediolateral compartment degenerative change and chondrocalcinosis similar to prior. Peripheral vascular calcifications are present. IMPRESSION: 1. No acute fracture or dislocation. 2. Small joint effusion and prepatellar soft tissue swelling similar to prior study. Electronically Signed   By: Ronney Asters M.D.   On: 07/31/2022 17:15   DG Chest Port 1 View  Result Date: 07/31/2022 CLINICAL DATA:  Trauma, fall. EXAM: PORTABLE CHEST 1 VIEW COMPARISON:  Chest x-ray 07/27/2022 FINDINGS: Heart is mildly enlarged, unchanged. The lungs are clear. There is no pleural effusion or pneumothorax. No acute fractures are seen. IMPRESSION: No acute cardiopulmonary process. Electronically Signed   By: Ronney Asters M.D.   On: 07/31/2022 17:14   DG Pelvis Portable  Result Date: 07/31/2022 CLINICAL DATA:  Trauma EXAM: PORTABLE PELVIS 1-2 VIEWS COMPARISON:  Pelvis x-ray 07/27/2022 FINDINGS: There is no evidence of pelvic  fracture or diastasis. No pelvic bone lesions are seen. There are moderate degenerative changes of both hips similar to the prior study. IMPRESSION: No acute bony abnormality. Moderate degenerative changes of both hips.  Electronically Signed   By: Ronney Asters M.D.   On: 07/31/2022 17:12   DG Clavicle Left  Result Date: 07/27/2022 CLINICAL DATA:  Evaluate for possible clavicle fracture. EXAM: LEFT CLAVICLE - 2+ VIEWS COMPARISON:  CT head and cervical spine earlier same day FINDINGS: No evidence for clavicle fracture. AC joint and glenohumeral joint degenerative changes. Visualized left hemithorax is unremarkable. IMPRESSION: No evidence for clavicle fracture. Degenerative changes. Electronically Signed   By: Lovey Newcomer M.D.   On: 07/27/2022 17:49   DG Shoulder Right Port  Result Date: 07/27/2022 CLINICAL DATA:  Trauma EXAM: RIGHT SHOULDER - 1 VIEW COMPARISON:  None Available. FINDINGS: There is no evidence of fracture or dislocation. There is moderate acromioclavicular and glenohumeral joint space narrowing and osteophyte formation compatible with degenerative change. Soft tissues are unremarkable. IMPRESSION: 1. No evidence of fracture or dislocation. 2. Moderate degenerative changes. Electronically Signed   By: Ronney Asters M.D.   On: 07/27/2022 17:03   DG Knee Right Port  Result Date: 07/27/2022 CLINICAL DATA:  Fall with right knee pain. EXAM: PORTABLE RIGHT KNEE - 1-2 VIEW COMPARISON:  None Available. FINDINGS: Mild tricompartmental osteoarthritic change. Chondrocalcinosis over the lateral compartment. Suggestion of a small joint effusion. No evidence of acute fracture or dislocation. Remainder of the exam is unremarkable. IMPRESSION: 1. No acute fracture. 2. Mild osteoarthritic change. Small joint effusion. Electronically Signed   By: Marin Olp M.D.   On: 07/27/2022 16:25   CT HEAD WO CONTRAST  Result Date: 07/27/2022 CLINICAL DATA:  Trauma. EXAM: CT HEAD WITHOUT CONTRAST CT CERVICAL SPINE WITHOUT CONTRAST TECHNIQUE: Multidetector CT imaging of the head and cervical spine was performed following the standard protocol without intravenous contrast. Multiplanar CT image reconstructions of the cervical spine  were also generated. RADIATION DOSE REDUCTION: This exam was performed according to the departmental dose-optimization program which includes automated exposure control, adjustment of the mA and/or kV according to patient size and/or use of iterative reconstruction technique. COMPARISON:  Brain CT 05/01/2019 FINDINGS: CT HEAD FINDINGS Brain: Ventricles and sulci are prominent compatible with atrophy. Periventricular and subcortical white matter hypodensities compatible with chronic microvascular ischemic changes. No evidence for acute cortically based infarct, intracranial hemorrhage, mass lesion or mass-effect. Left basal ganglia chronic lacunar infarcts. Vascular: No hyperdense vessel or unexpected calcification. Skull: Normal. Negative for fracture or focal lesion. Sinuses/Orbits: Paranasal sinuses well aerated. Mastoid air cells are unremarkable. Orbits are unremarkable. Other: Soft tissue swelling overlying the right frontal calvarium. CT CERVICAL SPINE FINDINGS Alignment: Normal. Skull base and vertebrae: No acute fracture. No primary bone lesion or focal pathologic process. Soft tissues and spinal canal: No prevertebral fluid or swelling. No visible canal hematoma. Disc levels: Multilevel degenerative disc and facet disease throughout the visualized cervical spine. No evidence for acute fracture. Upper chest: Negative. Other: Can not exclude possible distal left clavicle fracture on scout image. IMPRESSION: 1. No acute intracranial abnormality. Atrophy and chronic microvascular ischemic changes. 2. Soft tissue injury overlying the right frontal calvarium. 3. No acute cervical spine fracture. Multilevel degenerative disc disease. 4. Can not exclude possible distal left clavicle fracture on scout image. Recommend correlation with either chest radiograph or left clavicle radiograph. Electronically Signed   By: Lovey Newcomer M.D.   On: 07/27/2022 16:13   CT CERVICAL SPINE WO  CONTRAST  Result Date:  07/27/2022 CLINICAL DATA:  Trauma. EXAM: CT HEAD WITHOUT CONTRAST CT CERVICAL SPINE WITHOUT CONTRAST TECHNIQUE: Multidetector CT imaging of the head and cervical spine was performed following the standard protocol without intravenous contrast. Multiplanar CT image reconstructions of the cervical spine were also generated. RADIATION DOSE REDUCTION: This exam was performed according to the departmental dose-optimization program which includes automated exposure control, adjustment of the mA and/or kV according to patient size and/or use of iterative reconstruction technique. COMPARISON:  Brain CT 05/01/2019 FINDINGS: CT HEAD FINDINGS Brain: Ventricles and sulci are prominent compatible with atrophy. Periventricular and subcortical white matter hypodensities compatible with chronic microvascular ischemic changes. No evidence for acute cortically based infarct, intracranial hemorrhage, mass lesion or mass-effect. Left basal ganglia chronic lacunar infarcts. Vascular: No hyperdense vessel or unexpected calcification. Skull: Normal. Negative for fracture or focal lesion. Sinuses/Orbits: Paranasal sinuses well aerated. Mastoid air cells are unremarkable. Orbits are unremarkable. Other: Soft tissue swelling overlying the right frontal calvarium. CT CERVICAL SPINE FINDINGS Alignment: Normal. Skull base and vertebrae: No acute fracture. No primary bone lesion or focal pathologic process. Soft tissues and spinal canal: No prevertebral fluid or swelling. No visible canal hematoma. Disc levels: Multilevel degenerative disc and facet disease throughout the visualized cervical spine. No evidence for acute fracture. Upper chest: Negative. Other: Can not exclude possible distal left clavicle fracture on scout image. IMPRESSION: 1. No acute intracranial abnormality. Atrophy and chronic microvascular ischemic changes. 2. Soft tissue injury overlying the right frontal calvarium. 3. No acute cervical spine fracture. Multilevel  degenerative disc disease. 4. Can not exclude possible distal left clavicle fracture on scout image. Recommend correlation with either chest radiograph or left clavicle radiograph. Electronically Signed   By: Lovey Newcomer M.D.   On: 07/27/2022 16:13   DG Pelvis Portable  Result Date: 07/27/2022 CLINICAL DATA:  Trauma EXAM: PORTABLE PELVIS 1-2 VIEWS COMPARISON:  None Available. FINDINGS: There is no evidence of displaced pelvic fracture or diastasis. No pelvic bone lesions are seen. Nonobstructive pattern of overlying bowel gas. IMPRESSION: No displaced pelvic fracture or diastasis. Please note that plain radiographs are significantly insensitive for hip and pelvic fracture. Consider CT to more sensitively evaluate if there is high clinical suspicion for fracture. Electronically Signed   By: Delanna Ahmadi M.D.   On: 07/27/2022 15:31   DG Chest Port 1 View  Result Date: 07/27/2022 CLINICAL DATA:  Trauma, fall EXAM: PORTABLE CHEST 1 VIEW COMPARISON:  12/12/2021 FINDINGS: Cardiomegaly. Both lungs are clear. The visualized skeletal structures are unremarkable. IMPRESSION: Cardiomegaly without acute abnormality of the lungs in AP portable projection. Electronically Signed   By: Delanna Ahmadi M.D.   On: 07/27/2022 15:26    Assessment/Plan  1. Do not resuscitate Order updated  - Do not attempt resuscitation (DNR)  2. Closed fracture of sacrum, unspecified portion of sacrum, initial encounter ([place]) Ok to work with PT and OT Continue pain management with tylenol and neurontin   3. Falls frequently Using broad chair, floor mat, and frequent monitoring Should come to sit at the desk if alone to prevent falls.   4. Acute pain of right knee Continues with significant swelling despite taking prednisone Will try lasix for weight gain, which may help his swelling Recommend ortho outpatient f/u for knee aspiration if not improving.   5. Chronic diastolic heart failure ([facility]) Increase lasix to 60 mg for two  days then return to 40 mg daily Keep K the same  6. Longstanding persistent atrial fibrillation (  [place]) Afib rate is controlled Coumadin is on hold Will discuss with Dr Lyndel Safe with the likely recommendation to discontinue due to falls, bleeding risk.   7. Vascular dementia without behavioral disturbance ([place]) Progressing over the past year with increased falls Goals of care comfort based with no hospitalizations. DNR in place.   8. TBI With healing laceration Very small SDH  No focal deficit Coumadin on hold Evaluated by neurosurgery, no further recommendations.   9. Pancytopenia Worsening also with ABLA Continue to monitor No aggressive care.    He has several incidental findings on the CT scans done in the hospital R superior renal pole mass. Also Aneurysmal ascending thoracic aorta (4.3cm). L apical 61m pulm nodule  Would not follow these issues due to his goals of care.

## 2022-08-07 NOTE — Progress Notes (Addendum)
Location:  [facility] Room Number: [name] of Service:  SNF [phone]) Provider:  [name], NP   [name], [name] [name], [name]  Extended Emergency Contact Information Primary Emergency Contact: [name],[name] Address: [address]          [place], [place] [address] [name] Phone: [phone] Mobile Phone: [phone] Relation: Spouse Secondary Emergency Contact: [name],[name] Address: [address]          [place], [place] [address] [place] Mobile Phone: [phone] Relation: Daughter  Code Status:  DNR Goals of care: Advanced Directive information    08/07/2022   10:21 AM  Advanced Directives  Does Patient Have a Medical Advance Directive? Yes  Type of Advance Directive Living will;Out of facility DNR (pink MOST or yellow form)  Does patient want to make changes to medical advance directive? No - Patient declined  Pre-existing out of facility DNR order (yellow form or pink MOST form) Yellow form placed in chart (order not valid for inpatient use);Pink MOST form placed in chart (order not valid for inpatient use)     Chief Complaint  Patient presents with  . Acute Visit    Swelling     HPI:  Pt is a 89 y.o. male seen today for an acute visit for swelling and weight gain.  Nurse reports that he has swelling in the scrotal area and legs. He also has gained 3.5 lbs in the past two weeks. He is not having any increased work of breathing, PND, etc. Received one extra dose of lasix 20 mg 2/1  He has had several falls in the past year with injury. Mechanical. Poor safety awareness.   In the ED 1/25 diagnosed with small SDH and coumadin placed on hold for two weeks. Neurosurgery was consulted and due to the small size coumadin was not reversed. Also sacral fx noted at S4 and TP  fracture at L4.  He was sent home with oxycodone and therapy orders. He also was given a dose of cephalosporin for possible right knee infection. Right knee xray 1/25 showed no acute fx but some soft tissue swelling and small joint effusion. Seen by ortho, can f/u outpatient for bursitis if needed.   Also he fell on 1/21 and was sent to the ED where he was treated for a laceration to his right forehead with sutures. CT of the head was done then as well and showed no acute bleed. He had right shoulder pain after this fall. Xray on the right shoulder was negative. The right knee also was painful and had swelling and bruising fairly significant with a skin tear. On 1/24 he was given prednisone for the right knee pain.   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 Coumadin on hold.   Urinary retention: due to BPH has foley due to chronic retention. Hx of recurrent UTI and renal mass no longer followed due to age, dementia, goals of care  Dementia: progressing over the past year. Needs reminders, cuing, assistance with ADLs. Has caregiver. Prior his recent fall he was walking with a walker.   Chronic pancytopenia: has seen Dr Alen Blew in the past. Possibly due  to etoh vs myelodysplasia. Due to goals of care no longer going out for apts.     Past Medical History:  Diagnosis Date  . Adult failure to thrive    Per incoming records from [facility]  . Allergic rhinitis    Per incoming records from [facility]  . Atrial fibrillation ([place])   . BPH (benign prostatic hyperplasia)    Per incoming records from [facility]  . Cancer of kidney [facility])    Right, Per incoming records from [facility]  . Cataract    Per incoming records from [facility]  . Cerebrovascular accident, late effects    Per incoming records from [facility]  . Chronic kidney disease, stage III (moderate) ([place])    Per incoming records from [facility]  . [facility])    Per incoming records from [facility]  . Cognitive impairment    Mild, Per incoming records from [facility]  . Constipation    Per incoming records from [facility]  . Dementia ([place])   . Diarrhea    Better off Aricept, Per incoming records from [facility]  . Diastolic dysfunction    on 2D Echo 07/2009 and 2016, Per incoming records from [facility]  . Diverticulosis    Mild, Per incoming records from [facility]  . Diverticulosis of colon (without mention of hemorrhage)   . ED (erectile dysfunction)    Per incoming records from [facility]  . History of Coumadin therapy    Per incoming records from [facility]  . History of echocardiogram 09/2014   Per incoming records from [facility]  . Hyperlipemia   . Hypertension   . Kidney mass    Per incoming records from [facility]  . Lower leg edema    Per incoming records from [facility]  . Melanoma ([place])   . Neuropathy    Per incoming records from [facility]  . OA (osteoarthritis)    Per incoming records from [facility]  . Peripheral neuropathy    Per incoming records from [facility]  . Personal history of colonic polyps 1999 & 2004   adenomatous polyps  . Pulmonary arterial hypertension ([place])    Per incoming records from [facility]  . Rosacea    Per incoming records from [facility]  . Thrombocytopenia ([place]) 2017   Per incoming records from [facility]  . UTI (urinary tract infection)    Sepsis, Per incoming  records from [facility]  . Ventricular hypertrophy   . Walker as ambulation aid    Per incoming records from [employer]   Past Surgical History:  Procedure Laterality Date  . APPENDECTOMY     Per incoming records from [facility]  . CATARACT EXTRACTION     Per incoming records from [facility]  . IR CATHETER TUBE CHANGE  11/21/2019  . KNEE SURGERY     right  . PILONIDAL CYST DRAINAGE    . POLYPECTOMY     Per incoming records from [facility]  . schwanoma tumor lumbar spine    . SPINE SURGERY     tumor removed  . THORACIC 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, Per incoming records from [facility]  . TONSILLECTOMY AND ADENOIDECTOMY      Allergies  Allergen Reactions  . Penicillins Rash    Has patient had a PCN reaction causing immediate rash, facial/tongue/throat swelling, SOB or lightheadedness with hypotension: unknown Has patient had a PCN reaction causing severe rash involving mucus membranes or skin necrosis: unknown Has patient had a PCN reaction that required hospitalization : unknown Has patient had a PCN reaction occurring within the last 10 years: no If all of the above answers are "NO", then may proceed with Cephalosporin use.     Outpatient Encounter Medications as of 08/07/2022  Medication Sig  . acetaminophen (TYLENOL) 500 MG tablet Take 1,000 mg by mouth 2 (two) times daily.  . cholecalciferol (VITAMIN D3) 25 MCG (1000 UT) tablet Take 2,000 Units by mouth daily.   . Cranberry 400 MG CAPS Take 400 mg by mouth every morning. 400 mg, oral, Once a morning  . DULoxetine (CYMBALTA) 20 MG capsule Take 20 mg by mouth daily.  . furosemide (LASIX) 20 MG tablet Take 2 tablets (40 mg total) by mouth daily. Take extra '20mg'$  for weight gain of 2-3lbs in 1 day or 5 lbs in 1 week  . gabapentin (NEURONTIN) 100 MG capsule Take 200 mg by mouth at bedtime.  .  gabapentin (NEURONTIN) 100 MG capsule Take 200 mg by mouth daily. In the morning 8 am-11 am, see other listing for bedtime dosing  . ketoconazole (NIZORAL) 2 % shampoo Apply 1 Application topically 2 (two) times a week.  . polyethylene glycol (MIRALAX / GLYCOLAX) 17 g packet Take 17 g by mouth daily.  . potassium chloride (MICRO-K) 10 MEQ CR capsule Take 10 mEq by mouth every Tuesday, Thursday, and Saturday at 6 PM. Every Tues., Thurs., Sat. 7:00 am - 3:00 pm, per [facility].  Marland Kitchen triamcinolone lotion (KENALOG) 0.1 % Apply 1 application. topically 2 (two) times daily as needed (rash on back).  Derrill Memo ON 08/16/2022] warfarin (COUMADIN) 4 MG tablet Take 1 tablet (4 mg total) by mouth daily. Once a day on Mon, Wed, Fri, Sat 4:00 pm - 7:00 pm (Patient not taking: Reported on 08/07/2022)  . [START ON 08/16/2022] warfarin (COUMADIN) 5 MG tablet Take 1 tablet (5 mg total) by mouth daily. Give PO every Sun, Tues, Thurs 4:00 pm - 7:00 pm (Patient not taking: Reported on 08/07/2022)  . [DISCONTINUED] predniSONE (DELTASONE) 20 MG tablet Take 20 mg by mouth daily.   No facility-administered encounter medications on file as of 08/07/2022.    Review of Systems  Constitutional:  Positive for activity change. Negative for appetite change, chills, diaphoresis, fatigue, fever and unexpected weight change.  Respiratory:  Negative for cough, shortness of breath, wheezing and stridor.   Cardiovascular:  Positive for leg swelling. Negative for chest pain and palpitations.  Gastrointestinal:  Negative for abdominal distention, abdominal pain, constipation and diarrhea.  Genitourinary:  Negative for difficulty urinating and dysuria.  Musculoskeletal:  Positive for gait problem. Negative for arthralgias, back pain, joint swelling and myalgias.  Skin:  Positive for color change and wound.  Neurological:  Negative for dizziness, seizures, syncope, facial asymmetry, speech difficulty, weakness and headaches.   Hematological:  Negative for adenopathy. Does not bruise/bleed easily.  Psychiatric/Behavioral:  Positive for confusion. Negative for agitation and behavioral problems.     Immunization History  Administered Date(s) Administered  . DTaP 01/20/2022  . Influenza, High Dose Seasonal PF 05/04/2020, 04/10/2021  . Influenza-Unspecified 04/17/2014, 05/01/2016, 04/15/2019, 04/11/2022  . Moderna Covid-19 Vaccine Bivalent Booster 44yr & up 04/17/2021  . Moderna SARS-COV2 Booster Vaccination 05/17/2020,  05/12/2022  . Moderna Sars-Covid-2 Vaccination 07/12/2019, 08/09/2019, 11/03/2021  . Pneumococcal Conjugate-13 04/20/2014  . Pneumococcal Polysaccharide-23 07/08/2003, 03/31/2011  . Pneumococcal-Unspecified 01/26/2004  . Td 08/23/2007  . Tdap 01/07/2012, 01/20/2022  . Zoster Recombinat (Shingrix) 10/05/2017, 12/09/2017  . Zoster, Live 07/17/2005   Pertinent  Health Maintenance Due  Topic Date Due  . INFLUENZA VACCINE  Completed      12/14/2021    8:00 AM 12/15/2021    9:00 AM 12/16/2021   10:00 AM 05/27/2022    1:32 PM 07/30/2022    2:31 PM  Fall Risk  Falls in the past year?    0 0  Was there an injury with Fall?    0 0  Fall Risk Category Calculator    0 0  Fall Risk Category (Retired)    Low   (RETIRED) Patient Fall Risk Level High fall risk High fall risk High fall risk Low fall risk   Patient at Risk for Falls Due to     History of fall(s)  Fall risk Follow up     Falls evaluation completed   Functional Status Survey:    Vitals:   08/07/22 1019  BP: 135/66  Temp: (!) 97.3 F (36.3 C)  Weight: 264 lb 8 oz (120 kg)  Height: '6\' 7"'$  (2.007 m)   Body mass index is 29.8 kg/m.   Physical Exam Vitals and nursing note reviewed.  Constitutional:      General: He is not in acute distress.    Appearance: He is not diaphoretic.  HENT:     Head:     Comments: Bruising to right forehead.     Mouth/Throat:     Mouth: Mucous membranes are moist.     Pharynx: Oropharynx is  clear.  Eyes:     Conjunctiva/sclera: Conjunctivae normal.     Pupils: Pupils are equal, round, and reactive to light.  Neck:     Thyroid: No thyromegaly.     Vascular: No JVD.     Trachea: No tracheal deviation.  Cardiovascular:     Rate and Rhythm: Normal rate. Rhythm irregular.     Heart sounds: No murmur heard. Pulmonary:     Effort: Pulmonary effort is normal. No respiratory distress.     Breath sounds: Normal breath sounds. No wheezing.     Comments: Chronic cyanosis.  Abdominal:     General: Bowel sounds are normal. There is no distension.     Palpations: Abdomen is soft.     Tenderness: There is no abdominal tenderness.  Genitourinary:    Comments: Dark urine in s/p bag S/p site CDI Musculoskeletal:        General: Swelling (right knee with erythema, bruising, mild warmth.) present.     Right lower leg: Edema (+3) present.     Left lower leg: Edema (+2) present.  Lymphadenopathy:     Cervical: No cervical adenopathy.  Skin:    General: Skin is warm and dry.  Neurological:     General: No focal deficit present.     Mental Status: He is alert. Mental status is at baseline.     Cranial Nerves: No cranial nerve deficit.    Labs reviewed: Recent Labs    07/27/22 1503 07/31/22 1655 08/01/22 0300  NA 140 137  140 138  K 4.5 4.9  5.0 4.2  CL 107 104  106 106  CO2 '26 24 22  '$ GLUCOSE 107* 172*  165* 143*  BUN 26* 30*  33* 27*  CREATININE 1.54* 1.57*  1.60* 1.33*  CALCIUM 10.4* 10.2 9.8   Recent Labs    07/27/22 1503 07/31/22 1655 08/01/22 0300  AST 42* 25 21  ALT '8 11 11  '$ ALKPHOS 100 96 80  BILITOT 0.9 1.3* 1.1  PROT 6.7 6.7 6.0*  ALBUMIN 3.8 3.9 3.3*   Recent Labs    12/11/21 2010 12/12/21 0650 05/05/22 0000 05/23/22 1657 06/09/22 0815 07/27/22 1503 07/31/22 1655 08/01/22 0300  WBC 5.1   < > 2.1 2.3   < > 2.5* 3.1* 3.9*  NEUTROABS 4.1  --  1.00 1.30  --   --   --   --   HGB 13.2   < > 11.9* 10.5*   < > 11.3* 10.6*  11.2* 9.2*  HCT 43.1    < > 37* 33*   < > 38.9* 34.9*  33.0* 30.0*  MCV 90.5   < >  --   --   --  94.2 91.8 90.4  PLT 88*   < > 77* 18*   < > 74* 73* 67*   < > = values in this interval not displayed.   Lab Results  Component Value Date   TSH 0.84 05/19/2019   Lab Results  Component Value Date   HGBA1C 6 08/22/2019   Lab Results  Component Value Date   CHOL 129 08/09/2020   HDL 32 (A) 08/09/2020   LDLCALC 80 08/09/2020   TRIG 86 08/09/2020    Significant Diagnostic Results in last 30 days:  CT HEAD WO CONTRAST (5MM)  Result Date: 08/01/2022 CLINICAL DATA:  Known hemorrhage EXAM: CT HEAD WITHOUT CONTRAST TECHNIQUE: Contiguous axial images were obtained from the base of the skull through the vertex without intravenous contrast. RADIATION DOSE REDUCTION: This exam was performed according to the departmental dose-optimization program which includes automated exposure control, adjustment of the mA and/or kV according to patient size and/or use of iterative reconstruction technique. COMPARISON:  CT head 1 day prior FINDINGS: Brain: Small volume subarachnoid hemorrhage along the underside of the right temporal lobe is unchanged (5-43). There is no edema or mass effect. There is no other acute intracranial hemorrhage or extra-axial fluid collection. There is no evidence of acute infarct. Parenchymal volume is stable. The ventricles are stable in size. Gray-white differentiation is preserved. Pituitary and suprasellar region are normal. There is no mass lesion or midline shift. Vascular: There is calcification of the bilateral carotid siphons and vertebral arteries. Skull: Normal. Negative for fracture or focal lesion. Sinuses/Orbits: The paranasal sinuses are clear. Bilateral lens implants are in place. The globes and orbits are otherwise unremarkable. Other: Right frontal and left parietal scalp swelling is again seen. IMPRESSION: Stable small volume subarachnoid hemorrhage overlying the right temporal lobe. No new acute  intracranial pathology. Electronically Signed   By: Valetta Mole M.D.   On: 08/01/2022 08:07   CT CHEST ABDOMEN PELVIS W CONTRAST  Result Date: 07/31/2022 CLINICAL DATA:  Polytrauma, blunt [id number] Trauma [id number] EXAM: CT CHEST, ABDOMEN, AND PELVIS WITH CONTRAST TECHNIQUE: Multidetector CT imaging of the chest, abdomen and pelvis was performed following the standard protocol during bolus administration of intravenous contrast. RADIATION DOSE REDUCTION: This exam was performed according to the departmental dose-optimization program which includes automated exposure control, adjustment of the mA and/or kV according to patient size and/or use of iterative reconstruction technique. CONTRAST:  20m OMNIPAQUE IOHEXOL 350 MG/ML SOLN COMPARISON:  CT abdomen pelvis 06/10/2018, CT abdomen pelvis 06/17/2019 FINDINGS: CHEST: Cardiovascular: No aortic injury. The  ascending thoracic aorta is enlarged in caliber measuring up to 4.3 cm. The descending thoracic aorta is normal in caliber. Severe atherosclerotic plaque. Aortic valve leaflet calcification. At least 2 vessel coronary artery calcification. The heart is mildly enlarged in size. No significant pericardial effusion. The main pulmonary artery is enlarged in caliber measuring up to 3.5 cm. No main pulmonary embolus. Mediastinum/Nodes: No pneumomediastinum. No mediastinal hematoma. The esophagus is unremarkable. The thyroid is unremarkable. The central airways are patent. No mediastinal, hilar, or axillary lymphadenopathy. Lungs/Pleura: Diffuse bronchial wall thickening. No focal consolidation. Left apical 7 mm subsolid pulmonary nodule (5:5). Slightly more inferior 5 mm solid left upper lobe pulmonary nodule (5:53). Right middle lobe 5 mm ground-glass airspace opacity (5:101). No pulmonary mass. No pulmonary contusion or laceration. No pneumatocele formation. Low-density right pleural fluid which may represent a trace pleural effusion versus chylothorax. No pneumothorax. No  hemothorax. Musculoskeletal/Chest wall: No chest wall mass. No acute rib or sternal fracture. Please see separately dictated CT thoracolumbar spine 07/31/2022. ABDOMEN / PELVIS: Hepatobiliary: Not enlarged. No focal lesion. No laceration or subcapsular hematoma. The gallbladder is otherwise unremarkable with no radio-opaque gallstones. No biliary ductal dilatation. Pancreas: Normal pancreatic contour. No main pancreatic duct dilatation. Spleen: Not enlarged. No focal lesion. No laceration, subcapsular hematoma, or vascular injury. Adrenals/Urinary Tract: No nodularity bilaterally. Nonspecific bilateral perinephric fat stranding. Bilateral kidneys enhance symmetrically. Interval increase in size of a right superior renal pole mass measuring at least 5 cm (from 3.7 cm) versus interval development of an adjacent new mass. Punctate left nephrolithiasis. No hydronephrosis. No contusion, laceration, or subcapsular hematoma. No injury to the vascular structures or collecting systems. No hydroureter. Suprapubic catheter terminates within in the collapsed urinary bladder lumen. Several calcified stones are noted within the gallbladder lumen measuring up to 8 mm. Stomach/Bowel: No small or large bowel wall thickening or dilatation. The appendix is not definitely identified with no inflammatory changes in the right lower quadrant to suggest acute appendicitis. Vasculature/Lymphatics: Severe atherosclerotic plaque. No abdominal aorta or iliac aneurysm. No active contrast extravasation or pseudoaneurysm. No abdominal, pelvic, inguinal lymphadenopathy. Reproductive: Prostate enlarged in size measuring up to 6.2 cm. Other: No simple free fluid ascites. No pneumoperitoneum. No hemoperitoneum. No mesenteric hematoma identified. No organized fluid collection. Musculoskeletal: No significant soft tissue hematoma. Age-indeterminate, possibly acute, displaced right S4 sacral ala fracture with extension to the right S4 and S5 sacral  foramina as well as extension to the left S3 sacral ala and left S3 sacral foramina (7:136, 8: 46). Please see separately dictated CT thoracolumbar spine 07/31/2022. No extension to the sacroiliac joints. No pelvic diastasis. No other acute displaced pelvic fracture. Ports and Devices: None. IMPRESSION: 1. Age-indeterminate, possibly acute, displaced bilateral sacral ala fractures extending to the right S4 and S5 as well as left S3 sacral foramina. 2. No acute intrathoracic, intra-abdominal, intrapelvic traumatic injury. 3. Please see separately dictated CT thoracolumbar spine as well as CT head and C-spine 07/31/2022. Other imaging findings of potential clinical significance: 1. Interval increase in size of a right superior renal pole mass measuring at least 5 cm (from 3.7 cm) versus interval development of an adjacent new mass. Findings suggestive of malignancy. When the patient is clinically stable and able to follow directions and hold their breath (preferably as an outpatient) further evaluation with dedicated MRI renal protocol should be considered. 2. 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 Recommend annual imaging followup by CTA or MRA. This recommendation follows 2010 ACCF/AHA/AATS/ACR/ASA/SCA/SCAI/SIR/STS/SVM Guidelines for the Diagnosis and Management  of Patients with Thoracic Aortic Disease. Circulation. 2010; 121: F354-T625. Aortic aneurysm NOS (ICD10-I71.9) . 3. Mild cardiomegaly. 4. Enlarged main pulmonary artery suggestive of pulmonary hypertension. 5. Left apical 7 mm subsolid pulmonary nodule. Follow-up non-contrast CT recommended at 3-6 months to confirm persistence. If unchanged, and solid component remains <6 mm, annual CT is recommended until 5 years of stability has been established. If persistent these nodules should be considered highly suspicious if the solid component of the nodule is 6 mm or greater in size and enlarging. This recommendation follows the consensus statement:  Guidelines for Management of Incidental Pulmonary Nodules Detected on CT Images: From the Fleischner Society 2017; Radiology 2017; 284:228-243. 6. Low-density right pleural fluid which may represent a trace pleural effusion versus chylothorax. 7. Nonobstructive left nephrolithiasis. 8. Calcified stones within the urinary bladder lumen. Urinary bladder decompressed with suprapubic catheter in appropriate position. 9. Prostatomegaly. 10. Aortic Atherosclerosis (ICD10-I70.0) including coronary artery calcification and aortic valve leaflet calcifications-correlate for aortic stenosis. Electronically Signed   By: Iven Finn M.D.   On: 07/31/2022 18:45   CT HEAD WO CONTRAST  Result Date: 07/31/2022 CLINICAL DATA:  Trauma EXAM: CT HEAD WITHOUT CONTRAST CT CERVICAL SPINE WITHOUT CONTRAST CT THORACIC SPINE WITHOUT CONTRAST CT LUMBAR SPINE WITHOUT CONTRAST TECHNIQUE: Multidetector CT imaging of the head and cervical, thoracic, and lumbar spine was performed following the standard protocol without intravenous contrast. Multiplanar CT image reconstructions of the cervical spine were also generated. RADIATION DOSE REDUCTION: This exam was performed according to the departmental dose-optimization program which includes automated exposure control, adjustment of the mA and/or kV according to patient size and/or use of iterative reconstruction technique. COMPARISON:  CT Head 05/01/19 FINDINGS: CT HEAD FINDINGS Brain: No evidence of acute infarction, hydrocephalus, or mass lesion/mass effect. There is a subtle hyperdensity along the right tentorial leaflet (series 5, image 49), which is new compared to 05/01/2019, and favored to represent a small amount of extra-axial blood products. There is advanced generalized volume loss. Vascular: No hyperdense vessel or unexpected calcification. Skull: Soft tissue swelling along the right frontal scalp in the left parietal scalp. No evidence of underlying calvarial fracture.  Sinuses/Orbits: Bilateral lens replacement. No evidence of mastoid or middle ear effusion. Paranasal sinuses are clear. Other: None CT CERVICAL, THORACIC, LUMBAR SPINE FINDINGS Alignment: Grade 1 anterolisthesis of C5 on C6.  Otherwise normal. Skull base and vertebrae: No evidence SP cervical spine fracture. Multilevel degenerative changes. With likely mild-to-moderate spinal canal stenosis at C3-C4. There has been posterior decompression at the T5-T8 level. No evidence of an acute thoracic spine fracture. There may be an acute fracture at the right L4 transverse process (series 2, image 54). There is an acute fracture through the S4 vertebral body. Soft tissues and spinal canal: See separately dictated CT chest abdomen and pelvis for soft tissue findings in the chest abdomen and pelvis. Disc levels: Limitations: Assessment of the spinal canal limited due to CT technique. Within this limitation Cervical spine: Likely mild-to-moderate spinal canal stenosis at C3-C4. Thoracic spine: No evidence of high-grade spinal canal stenosis. Lumbar spine: Congenitally short pedicles. Severe spinal canal stenosis from the L1-L2 level to the L4-L5 levels. There is also severe bilateral neural foraminal stenosis from the L3-L4 to the L5-S1 levels. Upper chest: See separately dictated CT chest abdomen and pelvis. IMPRESSION: CT HEAD: 1. Subtle hyperdensity along the right tentorial leaflet, favored to represent a small amount of extra-axial blood products. Recommend follow up CT to ensure stability. 2. Right frontal and left  parietal scalp soft tissue swelling. No evidence of underlying calvarial fracture. CT CERVICAL SPINE: No acute cervical spine fracture. CT THORACIC SPINE: No acute thoracic spine fracture. CT LUMBAR SPINE: 1. Acute fracture through the S4 vertebral body. 2. Possible acute fracture of the right L4 transverse process. 3. Severe spinal canal stenosis from the L1-L2 level to the L4-L5 levels. Severe bilateral neural  foraminal stenosis from the L3-L4 to the L5-S1 levels. Electronically Signed   By: Marin Roberts M.D.   On: 07/31/2022 18:08   CT CERVICAL SPINE WO CONTRAST  Result Date: 07/31/2022 CLINICAL DATA:  Trauma EXAM: CT HEAD WITHOUT CONTRAST CT CERVICAL SPINE WITHOUT CONTRAST CT THORACIC SPINE WITHOUT CONTRAST CT LUMBAR SPINE WITHOUT CONTRAST TECHNIQUE: Multidetector CT imaging of the head and cervical, thoracic, and lumbar spine was performed following the standard protocol without intravenous contrast. Multiplanar CT image reconstructions of the cervical spine were also generated. RADIATION DOSE REDUCTION: This exam was performed according to the departmental dose-optimization program which includes automated exposure control, adjustment of the mA and/or kV according to patient size and/or use of iterative reconstruction technique. COMPARISON:  CT Head 05/01/19 FINDINGS: CT HEAD FINDINGS Brain: No evidence of acute infarction, hydrocephalus, or mass lesion/mass effect. There is a subtle hyperdensity along the right tentorial leaflet (series 5, image 49), which is new compared to 05/01/2019, and favored to represent a small amount of extra-axial blood products. There is advanced generalized volume loss. Vascular: No hyperdense vessel or unexpected calcification. Skull: Soft tissue swelling along the right frontal scalp in the left parietal scalp. No evidence of underlying calvarial fracture. Sinuses/Orbits: Bilateral lens replacement. No evidence of mastoid or middle ear effusion. Paranasal sinuses are clear. Other: None CT CERVICAL, THORACIC, LUMBAR SPINE FINDINGS Alignment: Grade 1 anterolisthesis of C5 on C6.  Otherwise normal. Skull base and vertebrae: No evidence SP cervical spine fracture. Multilevel degenerative changes. With likely mild-to-moderate spinal canal stenosis at C3-C4. There has been posterior decompression at the T5-T8 level. No evidence of an acute thoracic spine fracture. There may be an acute  fracture at the right L4 transverse process (series 2, image 54). There is an acute fracture through the S4 vertebral body. Soft tissues and spinal canal: See separately dictated CT chest abdomen and pelvis for soft tissue findings in the chest abdomen and pelvis. Disc levels: Limitations: Assessment of the spinal canal limited due to CT technique. Within this limitation Cervical spine: Likely mild-to-moderate spinal canal stenosis at C3-C4. Thoracic spine: No evidence of high-grade spinal canal stenosis. Lumbar spine: Congenitally short pedicles. Severe spinal canal stenosis from the L1-L2 level to the L4-L5 levels. There is also severe bilateral neural foraminal stenosis from the L3-L4 to the L5-S1 levels. Upper chest: See separately dictated CT chest abdomen and pelvis. IMPRESSION: CT HEAD: 1. Subtle hyperdensity along the right tentorial leaflet, favored to represent a small amount of extra-axial blood products. Recommend follow up CT to ensure stability. 2. Right frontal and left parietal scalp soft tissue swelling. No evidence of underlying calvarial fracture. CT CERVICAL SPINE: No acute cervical spine fracture. CT THORACIC SPINE: No acute thoracic spine fracture. CT LUMBAR SPINE: 1. Acute fracture through the S4 vertebral body. 2. Possible acute fracture of the right L4 transverse process. 3. Severe spinal canal stenosis from the L1-L2 level to the L4-L5 levels. Severe bilateral neural foraminal stenosis from the L3-L4 to the L5-S1 levels. Electronically Signed   By: Marin Roberts M.D.   On: 07/31/2022 18:08   CT T-SPINE NO CHARGE  Result Date: 07/31/2022 CLINICAL DATA:  Trauma EXAM: CT HEAD WITHOUT CONTRAST CT CERVICAL SPINE WITHOUT CONTRAST CT THORACIC SPINE WITHOUT CONTRAST CT LUMBAR SPINE WITHOUT CONTRAST TECHNIQUE: Multidetector CT imaging of the head and cervical, thoracic, and lumbar spine was performed following the standard protocol without intravenous contrast. Multiplanar CT image  reconstructions of the cervical spine were also generated. RADIATION DOSE REDUCTION: This exam was performed according to the departmental dose-optimization program which includes automated exposure control, adjustment of the mA and/or kV according to patient size and/or use of iterative reconstruction technique. COMPARISON:  CT Head 05/01/19 FINDINGS: CT HEAD FINDINGS Brain: No evidence of acute infarction, hydrocephalus, or mass lesion/mass effect. There is a subtle hyperdensity along the right tentorial leaflet (series 5, image 49), which is new compared to 05/01/2019, and favored to represent a small amount of extra-axial blood products. There is advanced generalized volume loss. Vascular: No hyperdense vessel or unexpected calcification. Skull: Soft tissue swelling along the right frontal scalp in the left parietal scalp. No evidence of underlying calvarial fracture. Sinuses/Orbits: Bilateral lens replacement. No evidence of mastoid or middle ear effusion. Paranasal sinuses are clear. Other: None CT CERVICAL, THORACIC, LUMBAR SPINE FINDINGS Alignment: Grade 1 anterolisthesis of C5 on C6.  Otherwise normal. Skull base and vertebrae: No evidence SP cervical spine fracture. Multilevel degenerative changes. With likely mild-to-moderate spinal canal stenosis at C3-C4. There has been posterior decompression at the T5-T8 level. No evidence of an acute thoracic spine fracture. There may be an acute fracture at the right L4 transverse process (series 2, image 54). There is an acute fracture through the S4 vertebral body. Soft tissues and spinal canal: See separately dictated CT chest abdomen and pelvis for soft tissue findings in the chest abdomen and pelvis. Disc levels: Limitations: Assessment of the spinal canal limited due to CT technique. Within this limitation Cervical spine: Likely mild-to-moderate spinal canal stenosis at C3-C4. Thoracic spine: No evidence of high-grade spinal canal stenosis. Lumbar spine:  Congenitally short pedicles. Severe spinal canal stenosis from the L1-L2 level to the L4-L5 levels. There is also severe bilateral neural foraminal stenosis from the L3-L4 to the L5-S1 levels. Upper chest: See separately dictated CT chest abdomen and pelvis. IMPRESSION: CT HEAD: 1. Subtle hyperdensity along the right tentorial leaflet, favored to represent a small amount of extra-axial blood products. Recommend follow up CT to ensure stability. 2. Right frontal and left parietal scalp soft tissue swelling. No evidence of underlying calvarial fracture. CT CERVICAL SPINE: No acute cervical spine fracture. CT THORACIC SPINE: No acute thoracic spine fracture. CT LUMBAR SPINE: 1. Acute fracture through the S4 vertebral body. 2. Possible acute fracture of the right L4 transverse process. 3. Severe spinal canal stenosis from the L1-L2 level to the L4-L5 levels. Severe bilateral neural foraminal stenosis from the L3-L4 to the L5-S1 levels. Electronically Signed   By: Marin Roberts M.D.   On: 07/31/2022 18:08   CT L-SPINE NO CHARGE  Result Date: 07/31/2022 CLINICAL DATA:  Trauma EXAM: CT HEAD WITHOUT CONTRAST CT CERVICAL SPINE WITHOUT CONTRAST CT THORACIC SPINE WITHOUT CONTRAST CT LUMBAR SPINE WITHOUT CONTRAST TECHNIQUE: Multidetector CT imaging of the head and cervical, thoracic, and lumbar spine was performed following the standard protocol without intravenous contrast. Multiplanar CT image reconstructions of the cervical spine were also generated. RADIATION DOSE REDUCTION: This exam was performed according to the departmental dose-optimization program which includes automated exposure control, adjustment of the mA and/or kV according to patient size and/or use of iterative reconstruction technique. COMPARISON:  CT Head 05/01/19 FINDINGS: CT HEAD FINDINGS Brain: No evidence of acute infarction, hydrocephalus, or mass lesion/mass effect. There is a subtle hyperdensity along the right tentorial leaflet (series 5, image  49), which is new compared to 05/01/2019, and favored to represent a small amount of extra-axial blood products. There is advanced generalized volume loss. Vascular: No hyperdense vessel or unexpected calcification. Skull: Soft tissue swelling along the right frontal scalp in the left parietal scalp. No evidence of underlying calvarial fracture. Sinuses/Orbits: Bilateral lens replacement. No evidence of mastoid or middle ear effusion. Paranasal sinuses are clear. Other: None CT CERVICAL, THORACIC, LUMBAR SPINE FINDINGS Alignment: Grade 1 anterolisthesis of C5 on C6.  Otherwise normal. Skull base and vertebrae: No evidence SP cervical spine fracture. Multilevel degenerative changes. With likely mild-to-moderate spinal canal stenosis at C3-C4. There has been posterior decompression at the T5-T8 level. No evidence of an acute thoracic spine fracture. There may be an acute fracture at the right L4 transverse process (series 2, image 54). There is an acute fracture through the S4 vertebral body. Soft tissues and spinal canal: See separately dictated CT chest abdomen and pelvis for soft tissue findings in the chest abdomen and pelvis. Disc levels: Limitations: Assessment of the spinal canal limited due to CT technique. Within this limitation Cervical spine: Likely mild-to-moderate spinal canal stenosis at C3-C4. Thoracic spine: No evidence of high-grade spinal canal stenosis. Lumbar spine: Congenitally short pedicles. Severe spinal canal stenosis from the L1-L2 level to the L4-L5 levels. There is also severe bilateral neural foraminal stenosis from the L3-L4 to the L5-S1 levels. Upper chest: See separately dictated CT chest abdomen and pelvis. IMPRESSION: CT HEAD: 1. Subtle hyperdensity along the right tentorial leaflet, favored to represent a small amount of extra-axial blood products. Recommend follow up CT to ensure stability. 2. Right frontal and left parietal scalp soft tissue swelling. No evidence of underlying  calvarial fracture. CT CERVICAL SPINE: No acute cervical spine fracture. CT THORACIC SPINE: No acute thoracic spine fracture. CT LUMBAR SPINE: 1. Acute fracture through the S4 vertebral body. 2. Possible acute fracture of the right L4 transverse process. 3. Severe spinal canal stenosis from the L1-L2 level to the L4-L5 levels. Severe bilateral neural foraminal stenosis from the L3-L4 to the L5-S1 levels. Electronically Signed   By: Marin Roberts M.D.   On: 07/31/2022 18:08   DG Knee 2 Views Right  Result Date: 07/31/2022 CLINICAL DATA:  Swelling, fall. EXAM: RIGHT KNEE - 1-2 VIEW COMPARISON:  Right knee x-ray 07/27/2022 FINDINGS: Small joint effusion persists. There is prepatellar soft tissue swelling similar to the prior study. There is no acute fracture or dislocation. There is mediolateral compartment degenerative change and chondrocalcinosis similar to prior. Peripheral vascular calcifications are present. IMPRESSION: 1. No acute fracture or dislocation. 2. Small joint effusion and prepatellar soft tissue swelling similar to prior study. Electronically Signed   By: Ronney Asters M.D.   On: 07/31/2022 17:15   DG Chest Port 1 View  Result Date: 07/31/2022 CLINICAL DATA:  Trauma, fall. EXAM: PORTABLE CHEST 1 VIEW COMPARISON:  Chest x-ray 07/27/2022 FINDINGS: Heart is mildly enlarged, unchanged. The lungs are clear. There is no pleural effusion or pneumothorax. No acute fractures are seen. IMPRESSION: No acute cardiopulmonary process. Electronically Signed   By: Ronney Asters M.D.   On: 07/31/2022 17:14   DG Pelvis Portable  Result Date: 07/31/2022 CLINICAL DATA:  Trauma EXAM: PORTABLE PELVIS 1-2 VIEWS COMPARISON:  Pelvis x-ray 07/27/2022 FINDINGS: There is no evidence of pelvic  fracture or diastasis. No pelvic bone lesions are seen. There are moderate degenerative changes of both hips similar to the prior study. IMPRESSION: No acute bony abnormality. Moderate degenerative changes of both hips.  Electronically Signed   By: Ronney Asters M.D.   On: 07/31/2022 17:12   DG Clavicle Left  Result Date: 07/27/2022 CLINICAL DATA:  Evaluate for possible clavicle fracture. EXAM: LEFT CLAVICLE - 2+ VIEWS COMPARISON:  CT head and cervical spine earlier same day FINDINGS: No evidence for clavicle fracture. AC joint and glenohumeral joint degenerative changes. Visualized left hemithorax is unremarkable. IMPRESSION: No evidence for clavicle fracture. Degenerative changes. Electronically Signed   By: Lovey Newcomer M.D.   On: 07/27/2022 17:49   DG Shoulder Right Port  Result Date: 07/27/2022 CLINICAL DATA:  Trauma EXAM: RIGHT SHOULDER - 1 VIEW COMPARISON:  None Available. FINDINGS: There is no evidence of fracture or dislocation. There is moderate acromioclavicular and glenohumeral joint space narrowing and osteophyte formation compatible with degenerative change. Soft tissues are unremarkable. IMPRESSION: 1. No evidence of fracture or dislocation. 2. Moderate degenerative changes. Electronically Signed   By: Ronney Asters M.D.   On: 07/27/2022 17:03   DG Knee Right Port  Result Date: 07/27/2022 CLINICAL DATA:  Fall with right knee pain. EXAM: PORTABLE RIGHT KNEE - 1-2 VIEW COMPARISON:  None Available. FINDINGS: Mild tricompartmental osteoarthritic change. Chondrocalcinosis over the lateral compartment. Suggestion of a small joint effusion. No evidence of acute fracture or dislocation. Remainder of the exam is unremarkable. IMPRESSION: 1. No acute fracture. 2. Mild osteoarthritic change. Small joint effusion. Electronically Signed   By: Marin Olp M.D.   On: 07/27/2022 16:25   CT HEAD WO CONTRAST  Result Date: 07/27/2022 CLINICAL DATA:  Trauma. EXAM: CT HEAD WITHOUT CONTRAST CT CERVICAL SPINE WITHOUT CONTRAST TECHNIQUE: Multidetector CT imaging of the head and cervical spine was performed following the standard protocol without intravenous contrast. Multiplanar CT image reconstructions of the cervical spine  were also generated. RADIATION DOSE REDUCTION: This exam was performed according to the departmental dose-optimization program which includes automated exposure control, adjustment of the mA and/or kV according to patient size and/or use of iterative reconstruction technique. COMPARISON:  Brain CT 05/01/2019 FINDINGS: CT HEAD FINDINGS Brain: Ventricles and sulci are prominent compatible with atrophy. Periventricular and subcortical white matter hypodensities compatible with chronic microvascular ischemic changes. No evidence for acute cortically based infarct, intracranial hemorrhage, mass lesion or mass-effect. Left basal ganglia chronic lacunar infarcts. Vascular: No hyperdense vessel or unexpected calcification. Skull: Normal. Negative for fracture or focal lesion. Sinuses/Orbits: Paranasal sinuses well aerated. Mastoid air cells are unremarkable. Orbits are unremarkable. Other: Soft tissue swelling overlying the right frontal calvarium. CT CERVICAL SPINE FINDINGS Alignment: Normal. Skull base and vertebrae: No acute fracture. No primary bone lesion or focal pathologic process. Soft tissues and spinal canal: No prevertebral fluid or swelling. No visible canal hematoma. Disc levels: Multilevel degenerative disc and facet disease throughout the visualized cervical spine. No evidence for acute fracture. Upper chest: Negative. Other: Can not exclude possible distal left clavicle fracture on scout image. IMPRESSION: 1. No acute intracranial abnormality. Atrophy and chronic microvascular ischemic changes. 2. Soft tissue injury overlying the right frontal calvarium. 3. No acute cervical spine fracture. Multilevel degenerative disc disease. 4. Can not exclude possible distal left clavicle fracture on scout image. Recommend correlation with either chest radiograph or left clavicle radiograph. Electronically Signed   By: Lovey Newcomer M.D.   On: 07/27/2022 16:13   CT CERVICAL SPINE WO  CONTRAST  Result Date:  07/27/2022 CLINICAL DATA:  Trauma. EXAM: CT HEAD WITHOUT CONTRAST CT CERVICAL SPINE WITHOUT CONTRAST TECHNIQUE: Multidetector CT imaging of the head and cervical spine was performed following the standard protocol without intravenous contrast. Multiplanar CT image reconstructions of the cervical spine were also generated. RADIATION DOSE REDUCTION: This exam was performed according to the departmental dose-optimization program which includes automated exposure control, adjustment of the mA and/or kV according to patient size and/or use of iterative reconstruction technique. COMPARISON:  Brain CT 05/01/2019 FINDINGS: CT HEAD FINDINGS Brain: Ventricles and sulci are prominent compatible with atrophy. Periventricular and subcortical white matter hypodensities compatible with chronic microvascular ischemic changes. No evidence for acute cortically based infarct, intracranial hemorrhage, mass lesion or mass-effect. Left basal ganglia chronic lacunar infarcts. Vascular: No hyperdense vessel or unexpected calcification. Skull: Normal. Negative for fracture or focal lesion. Sinuses/Orbits: Paranasal sinuses well aerated. Mastoid air cells are unremarkable. Orbits are unremarkable. Other: Soft tissue swelling overlying the right frontal calvarium. CT CERVICAL SPINE FINDINGS Alignment: Normal. Skull base and vertebrae: No acute fracture. No primary bone lesion or focal pathologic process. Soft tissues and spinal canal: No prevertebral fluid or swelling. No visible canal hematoma. Disc levels: Multilevel degenerative disc and facet disease throughout the visualized cervical spine. No evidence for acute fracture. Upper chest: Negative. Other: Can not exclude possible distal left clavicle fracture on scout image. IMPRESSION: 1. No acute intracranial abnormality. Atrophy and chronic microvascular ischemic changes. 2. Soft tissue injury overlying the right frontal calvarium. 3. No acute cervical spine fracture. Multilevel  degenerative disc disease. 4. Can not exclude possible distal left clavicle fracture on scout image. Recommend correlation with either chest radiograph or left clavicle radiograph. Electronically Signed   By: Lovey Newcomer M.D.   On: 07/27/2022 16:13   DG Pelvis Portable  Result Date: 07/27/2022 CLINICAL DATA:  Trauma EXAM: PORTABLE PELVIS 1-2 VIEWS COMPARISON:  None Available. FINDINGS: There is no evidence of displaced pelvic fracture or diastasis. No pelvic bone lesions are seen. Nonobstructive pattern of overlying bowel gas. IMPRESSION: No displaced pelvic fracture or diastasis. Please note that plain radiographs are significantly insensitive for hip and pelvic fracture. Consider CT to more sensitively evaluate if there is high clinical suspicion for fracture. Electronically Signed   By: Delanna Ahmadi M.D.   On: 07/27/2022 15:31   DG Chest Port 1 View  Result Date: 07/27/2022 CLINICAL DATA:  Trauma, fall EXAM: PORTABLE CHEST 1 VIEW COMPARISON:  12/12/2021 FINDINGS: Cardiomegaly. Both lungs are clear. The visualized skeletal structures are unremarkable. IMPRESSION: Cardiomegaly without acute abnormality of the lungs in AP portable projection. Electronically Signed   By: Delanna Ahmadi M.D.   On: 07/27/2022 15:26    Assessment/Plan  1. Do not resuscitate Order updated  - Do not attempt resuscitation (DNR)  2. Closed fracture of sacrum, unspecified portion of sacrum, initial encounter ([place]) Ok to work with PT and OT Continue pain management with tylenol and neurontin   3. Falls frequently Using broad chair, floor mat, and frequent monitoring Should come to sit at the desk if alone to prevent falls.   4. Acute pain of right knee Continues with significant swelling despite taking prednisone Will try lasix for weight gain, which may help his swelling Recommend ortho outpatient f/u for knee aspiration if not improving.   5. Chronic diastolic heart failure (HCC) Increase lasix to 60 mg for two  days then return to 40 mg daily Keep K the same  6. Longstanding persistent atrial fibrillation (  [place]) Afib rate is controlled Coumadin is on hold Will discuss with Dr Lyndel Safe with the likely recommendation to discontinue due to falls, bleeding risk.   7. Vascular dementia without behavioral disturbance ([place]) Progressing over the past year with increased falls Goals of care comfort based with no hospitalizations. DNR in place.   8. TBI With healing laceration Very small SDH  No focal deficit Coumadin on hold Evaluated by neurosurgery, no further recommendations.   9. Pancytopenia Worsening also with ABLA Continue to monitor No aggressive care.    He has several incidental findings on the CT scans done in the hospital R superior renal pole mass. Also Aneurysmal ascending thoracic aorta (4.3cm). L apical 22m pulm nodule  Would not follow these issues due to his goals of care.

## 2022-08-08 ENCOUNTER — Encounter: Payer: Self-pay | Admitting: Adult Health

## 2022-08-08 ENCOUNTER — Non-Acute Institutional Stay (SKILLED_NURSING_FACILITY): Payer: Medicare Other | Admitting: Adult Health

## 2022-08-08 DIAGNOSIS — M25561 Pain in right knee: Secondary | ICD-10-CM | POA: Diagnosis not present

## 2022-08-08 DIAGNOSIS — F01A0 Vascular dementia, mild, without behavioral disturbance, psychotic disturbance, mood disturbance, and anxiety: Secondary | ICD-10-CM | POA: Diagnosis not present

## 2022-08-08 DIAGNOSIS — R41841 Cognitive communication deficit: Secondary | ICD-10-CM | POA: Diagnosis not present

## 2022-08-08 DIAGNOSIS — R296 Repeated falls: Secondary | ICD-10-CM

## 2022-08-08 DIAGNOSIS — S066X0S Traumatic subarachnoid hemorrhage without loss of consciousness, sequela: Secondary | ICD-10-CM | POA: Diagnosis not present

## 2022-08-08 DIAGNOSIS — G468 Other vascular syndromes of brain in cerebrovascular diseases: Secondary | ICD-10-CM | POA: Diagnosis not present

## 2022-08-08 DIAGNOSIS — Z7189 Other specified counseling: Secondary | ICD-10-CM

## 2022-08-08 DIAGNOSIS — F0150 Vascular dementia without behavioral disturbance: Secondary | ICD-10-CM

## 2022-08-08 DIAGNOSIS — I4811 Longstanding persistent atrial fibrillation: Secondary | ICD-10-CM | POA: Diagnosis not present

## 2022-08-08 DIAGNOSIS — M6281 Muscle weakness (generalized): Secondary | ICD-10-CM | POA: Diagnosis not present

## 2022-08-08 DIAGNOSIS — M6389 Disorders of muscle in diseases classified elsewhere, multiple sites: Secondary | ICD-10-CM | POA: Diagnosis not present

## 2022-08-08 MED ORDER — OXYCODONE HCL 5 MG PO TABS: 5.0000 mg | ORAL_TABLET | Freq: Four times a day (QID) | ORAL | 0 refills | Status: DC | PRN

## 2022-08-08 MED ORDER — DOXYCYCLINE HYCLATE 100 MG PO TABS: 100.0000 mg | ORAL_TABLET | Freq: Two times a day (BID) | ORAL | 0 refills | Status: DC

## 2022-08-08 NOTE — Progress Notes (Signed)
Location:  Occupational psychologist of Service:  SNF (31) Provider:   Cindi Carbon, [place] [phone]   [name], MD  [name], [name] [name], [name]  Extended Emergency Contact Information Primary Emergency Contact: Kreiser,[name] Address: [address]          [place], [place] [address] [name] of [place] Phone: [phone] Mobile Phone: [phone] Relation: Spouse Secondary Emergency Contact: [name],[name] Address: [address]          [place], [place] [address] [place] Mobile Phone: [phone] Relation: Daughter  Code Status:  DNR Goals of care: Advanced Directive information    08/07/2022   10:21 AM  Advanced Directives  Does Patient Have a Medical Advance Directive? Yes  Type of Advance Directive Living will;Out of facility DNR (pink MOST or yellow form)  Does patient want to make changes to medical advance directive? No - Patient declined  Pre-existing out of facility DNR order (yellow form or pink MOST form) Yellow form placed in chart (order not valid for inpatient use);Pink MOST form placed in chart (order not valid for inpatient use)     Chief Complaint  Patient presents with  . Acute Visit    Advanced care planing discussion     HPI:  Pt is a 89 y.o. male seen today for an acute visit for advanced care planning discussion.  In the ED 1/25 diagnosed with small SDH and coumadin placed on hold for two weeks. Neurosurgery was consulted and due to the small size coumadin was not reversed. Also sacral fx noted at S4 and TP fracture at L4.    Also he fell on 1/21 and was sent to the ED where he was treated for a laceration to his right forehead with sutures. CT of the head was done then as well and showed no acute bleed. He had right shoulder pain after this fall. Xray on  the right shoulder was negative. The right knee also was painful and had swelling and bruising fairly significant with a skin tear. On 1/24 he was given prednisone for the right knee pain.   His right knee continues to be painful, swollen and warm. See note from 08/07/22   Past Medical History:  Diagnosis Date  . Adult failure to thrive    Per incoming records from [facility]  . Allergic rhinitis    Per incoming records from [facility]  . Atrial fibrillation ([place])   . BPH (benign prostatic hyperplasia)    Per incoming records from [facility]  . Cancer of kidney [facility])    Right, Per incoming records from [facility]  . Cataract    Per incoming records from [facility]  . Cerebrovascular accident, late effects    Per incoming records from [facility]  . Chronic kidney disease, stage III (moderate) ([place])    [facility]  . [facility])    Per incoming records from [facility]  . Cognitive impairment    Mild, Per incoming records from [facility]  . Constipation    Per incoming records from [facility]  . Dementia ([place])   . Diarrhea    Better off Aricept, Per incoming records from [facility]  . Diastolic dysfunction    on 2D  Echo 07/2009 and 2016, Per incoming records from [facility]  . Diverticulosis    Mild, Per incoming records from [facility]  . Diverticulosis of colon (without mention of hemorrhage)   . ED (erectile dysfunction)    Per incoming records from [facility]  . History of Coumadin therapy    Per incoming records from [facility]  . History of echocardiogram 09/2014   Per incoming records from [facility]  . Hyperlipemia   . Hypertension   . Kidney mass    Per incoming records  from [facility]  . Lower leg edema    Per incoming records from [facility]  . Melanoma ([place])   . Neuropathy    Per incoming records from [facility]  . OA (osteoarthritis)    Per incoming records from [facility]  . Peripheral neuropathy    Per incoming records from [facility]  . Personal history of colonic polyps 1999 & 2004   adenomatous polyps  . Pulmonary arterial hypertension ([place])    Per incoming records from [facility]  . Rosacea    Per incoming records from [facility]  . Thrombocytopenia ([place]) 2017   Per incoming records from [facility]  . UTI (urinary tract infection)    Sepsis, Per incoming records from [facility]  . Ventricular hypertrophy   . Walker as ambulation aid    Per incoming records from [facility]   Past Surgical History:  Procedure Laterality Date  . APPENDECTOMY     Per incoming records from [facility]  . CATARACT EXTRACTION     Per incoming records from [facility]  . IR CATHETER TUBE CHANGE  11/21/2019  . KNEE SURGERY     right  . PILONIDAL CYST DRAINAGE    . POLYPECTOMY     Per incoming records from [facility]  . schwanoma tumor lumbar spine    . SPINE SURGERY     tumor removed  . THORACIC 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, Per incoming records from [facility]  . TONSILLECTOMY AND ADENOIDECTOMY      Allergies  Allergen Reactions  . Penicillins Rash    Has patient had a PCN reaction causing immediate rash, facial/tongue/throat swelling, SOB or lightheadedness with hypotension: unknown Has patient had a PCN reaction causing severe rash involving mucus membranes or skin necrosis: unknown Has patient had a PCN reaction that required hospitalization : unknown Has patient had a PCN reaction  occurring within the last 10 years: no If all of the above answers are "NO", then may proceed with Cephalosporin use.     Outpatient Encounter Medications as of 08/08/2022  Medication Sig  . doxycycline (VIBRA-TABS) 100 MG tablet Take 1 tablet (100 mg total) by mouth 2 (two) times daily.  Marland Kitchen oxyCODONE (ROXICODONE) 5 MG immediate release tablet Take 1 tablet (5 mg total) by mouth every 6 (six) hours as needed for severe pain or moderate pain.  Marland Kitchen acetaminophen (TYLENOL) 500 MG tablet Take 1,000 mg by mouth 2 (two) times daily.  . cholecalciferol (VITAMIN D3) 25 MCG (1000 UT) tablet Take 2,000 Units by mouth daily.   . Cranberry 400 MG CAPS Take 400 mg by mouth every morning. 400 mg, oral, Once a morning  . DULoxetine (CYMBALTA) 20 MG capsule Take 20 mg by mouth daily.  . furosemide (LASIX) 20 MG tablet  Take 2 tablets (40 mg total) by mouth daily. Take extra '20mg'$  for weight gain of 2-3lbs in 1 day or 5 lbs in 1 week  . gabapentin (NEURONTIN) 100 MG capsule Take 200 mg by mouth at bedtime.  . gabapentin (NEURONTIN) 100 MG capsule Take 200 mg by mouth daily. In the morning 8 am-11 am, see other listing for bedtime dosing  . ketoconazole (NIZORAL) 2 % shampoo Apply 1 Application topically 2 (two) times a week.  . polyethylene glycol (MIRALAX / GLYCOLAX) 17 g packet Take 17 g by mouth daily.  . potassium chloride (MICRO-K) 10 MEQ CR capsule Take 10 mEq by mouth every Tuesday, Thursday, and Saturday at 6 PM. Every Tues., Thurs., Sat. 7:00 am - 3:00 pm, per [facility].  Marland Kitchen triamcinolone lotion (KENALOG) 0.1 % Apply 1 application. topically 2 (two) times daily as needed (rash on back).  . [DISCONTINUED] warfarin (COUMADIN) 4 MG tablet Take 1 tablet (4 mg total) by mouth daily. Once a day on Mon, Wed, Fri, Sat 4:00 pm - 7:00 pm (Patient not taking: Reported on 08/07/2022)  . [DISCONTINUED] warfarin (COUMADIN) 5 MG tablet Take 1 tablet (5 mg total) by mouth daily. Give PO every Sun, Tues, Thurs 4:00 pm -  7:00 pm (Patient not taking: Reported on 08/07/2022)   No facility-administered encounter medications on file as of 08/08/2022.    Review of Systems  Constitutional:  Positive for activity change. Negative for appetite change, chills, diaphoresis, fatigue, fever and unexpected weight change.  Respiratory:  Negative for cough, shortness of breath, wheezing and stridor.   Cardiovascular:  Positive for leg swelling. Negative for chest pain and palpitations.  Gastrointestinal:  Negative for abdominal distention, abdominal pain, constipation and diarrhea.  Genitourinary:  Negative for difficulty urinating and dysuria.  Musculoskeletal:  Positive for arthralgias, back pain, gait problem and joint swelling. Negative for myalgias.  Skin:  Positive for wound.  Neurological:  Positive for weakness. Negative for dizziness, seizures, syncope, facial asymmetry, speech difficulty and headaches.  Hematological:  Negative for adenopathy. Does not bruise/bleed easily.  Psychiatric/Behavioral:  Positive for confusion. Negative for agitation and behavioral problems.     Immunization History  Administered Date(s) Administered  . DTaP 01/20/2022  . Influenza, High Dose Seasonal PF 05/04/2020, 04/10/2021  . Influenza-Unspecified 04/17/2014, 05/01/2016, 04/15/2019, 04/11/2022  . Moderna Covid-19 Vaccine Bivalent Booster 45yr & up 04/17/2021  . Moderna SARS-COV2 Booster Vaccination 05/17/2020, 05/12/2022  . Moderna Sars-Covid-2 Vaccination 07/12/2019, 08/09/2019, 11/03/2021  . Pneumococcal Conjugate-13 04/20/2014  . Pneumococcal Polysaccharide-23 07/08/2003, 03/31/2011  . Pneumococcal-Unspecified 01/26/2004  . Td 08/23/2007  . Tdap 01/07/2012, 01/20/2022  . Zoster Recombinat (Shingrix) 10/05/2017, 12/09/2017  . Zoster, Live 07/17/2005   Pertinent  Health Maintenance Due  Topic Date Due  . INFLUENZA VACCINE  Completed      12/14/2021    8:00 AM 12/15/2021    9:00 AM 12/16/2021   10:00 AM 05/27/2022     1:32 PM 07/30/2022    2:31 PM  Fall Risk  Falls in the past year?    0 0  Was there an injury with Fall?    0 0  Fall Risk Category Calculator    0 0  Fall Risk Category (Retired)    Low   (RETIRED) Patient Fall Risk Level High fall risk High fall risk High fall risk Low fall risk   Patient at Risk for Falls Due to     History of fall(s)  Fall risk Follow up  Falls evaluation completed   Functional Status Survey:    There were no vitals filed for this visit. There is no height or weight on file to calculate BMI. Physical Exam Vitals and nursing note reviewed.  Constitutional:      Comments: Frail tall male   Neurological:     Mental Status: He is alert.    Labs reviewed: Recent Labs    07/27/22 1503 07/31/22 1655 08/01/22 0300  NA 140 137  140 138  K 4.5 4.9  5.0 4.2  CL 107 104  106 106  CO2 '26 24 22  '$ GLUCOSE 107* 172*  165* 143*  BUN 26* 30*  33* 27*  CREATININE 1.54* 1.57*  1.60* 1.33*  CALCIUM 10.4* 10.2 9.8   Recent Labs    07/27/22 1503 07/31/22 1655 08/01/22 0300  AST 42* 25 21  ALT '8 11 11  '$ ALKPHOS 100 96 80  BILITOT 0.9 1.3* 1.1  PROT 6.7 6.7 6.0*  ALBUMIN 3.8 3.9 3.3*   Recent Labs    12/11/21 2010 12/12/21 0650 05/05/22 0000 05/23/22 1657 06/09/22 0815 07/27/22 1503 07/31/22 1655 08/01/22 0300  WBC 5.1   < > 2.1 2.3   < > 2.5* 3.1* 3.9*  NEUTROABS 4.1  --  1.00 1.30  --   --   --   --   HGB 13.2   < > 11.9* 10.5*   < > 11.3* 10.6*  11.2* 9.2*  HCT 43.1   < > 37* 33*   < > 38.9* 34.9*  33.0* 30.0*  MCV 90.5   < >  --   --   --  94.2 91.8 90.4  PLT 88*   < > 77* 18*   < > 74* 73* 67*   < > = values in this interval not displayed.   Lab Results  Component Value Date   TSH 0.84 05/19/2019   Lab Results  Component Value Date   HGBA1C 6 08/22/2019   Lab Results  Component Value Date   CHOL 129 08/09/2020   HDL 32 (A) 08/09/2020   LDLCALC 80 08/09/2020   TRIG 86 08/09/2020    Significant Diagnostic Results in last 30  days:  CT HEAD WO CONTRAST (5MM)  Result Date: 08/01/2022 CLINICAL DATA:  Known hemorrhage EXAM: CT HEAD WITHOUT CONTRAST TECHNIQUE: Contiguous axial images were obtained from the base of the skull through the vertex without intravenous contrast. RADIATION DOSE REDUCTION: This exam was performed according to the departmental dose-optimization program which includes automated exposure control, adjustment of the mA and/or kV according to patient size and/or use of iterative reconstruction technique. COMPARISON:  CT head 1 day prior FINDINGS: Brain: Small volume subarachnoid hemorrhage along the underside of the right temporal lobe is unchanged (5-43). There is no edema or mass effect. There is no other acute intracranial hemorrhage or extra-axial fluid collection. There is no evidence of acute infarct. Parenchymal volume is stable. The ventricles are stable in size. Gray-white differentiation is preserved. Pituitary and suprasellar region are normal. There is no mass lesion or midline shift. Vascular: There is calcification of the bilateral carotid siphons and vertebral arteries. Skull: Normal. Negative for fracture or focal lesion. Sinuses/Orbits: The paranasal sinuses are clear. Bilateral lens implants are in place. The globes and orbits are otherwise unremarkable. Other: Right frontal and left parietal scalp swelling is again seen. IMPRESSION: Stable small volume subarachnoid hemorrhage overlying the right temporal lobe. No new acute intracranial pathology. Electronically Signed   By: Collier Salina  Noone M.D.   On: 08/01/2022 08:07   CT CHEST ABDOMEN PELVIS W CONTRAST  Result Date: 07/31/2022 CLINICAL DATA:  Polytrauma, blunt [id number] Trauma [id number] EXAM: CT CHEST, ABDOMEN, AND PELVIS WITH CONTRAST TECHNIQUE: Multidetector CT imaging of the chest, abdomen and pelvis was performed following the standard protocol during bolus administration of intravenous contrast. RADIATION DOSE REDUCTION: This exam was performed  according to the departmental dose-optimization program which includes automated exposure control, adjustment of the mA and/or kV according to patient size and/or use of iterative reconstruction technique. CONTRAST:  30m OMNIPAQUE IOHEXOL 350 MG/ML SOLN COMPARISON:  CT abdomen pelvis 06/10/2018, CT abdomen pelvis 06/17/2019 FINDINGS: CHEST: Cardiovascular: No aortic injury. The ascending thoracic aorta is enlarged in caliber measuring up to 4.3 cm. The descending thoracic aorta is normal in caliber. Severe atherosclerotic plaque. Aortic valve leaflet calcification. At least 2 vessel coronary artery calcification. The heart is mildly enlarged in size. No significant pericardial effusion. The main pulmonary artery is enlarged in caliber measuring up to 3.5 cm. No main pulmonary embolus. Mediastinum/Nodes: No pneumomediastinum. No mediastinal hematoma. The esophagus is unremarkable. The thyroid is unremarkable. The central airways are patent. No mediastinal, hilar, or axillary lymphadenopathy. Lungs/Pleura: Diffuse bronchial wall thickening. No focal consolidation. Left apical 7 mm subsolid pulmonary nodule (5:5). Slightly more inferior 5 mm solid left upper lobe pulmonary nodule (5:53). Right middle lobe 5 mm ground-glass airspace opacity (5:101). No pulmonary mass. No pulmonary contusion or laceration. No pneumatocele formation. Low-density right pleural fluid which may represent a trace pleural effusion versus chylothorax. No pneumothorax. No hemothorax. Musculoskeletal/Chest wall: No chest wall mass. No acute rib or sternal fracture. Please see separately dictated CT thoracolumbar spine 07/31/2022. ABDOMEN / PELVIS: Hepatobiliary: Not enlarged. No focal lesion. No laceration or subcapsular hematoma. The gallbladder is otherwise unremarkable with no radio-opaque gallstones. No biliary ductal dilatation. Pancreas: Normal pancreatic contour. No main pancreatic duct dilatation. Spleen: Not enlarged. No focal lesion.  No laceration, subcapsular hematoma, or vascular injury. Adrenals/Urinary Tract: No nodularity bilaterally. Nonspecific bilateral perinephric fat stranding. Bilateral kidneys enhance symmetrically. Interval increase in size of a right superior renal pole mass measuring at least 5 cm (from 3.7 cm) versus interval development of an adjacent new mass. Punctate left nephrolithiasis. No hydronephrosis. No contusion, laceration, or subcapsular hematoma. No injury to the vascular structures or collecting systems. No hydroureter. Suprapubic catheter terminates within in the collapsed urinary bladder lumen. Several calcified stones are noted within the gallbladder lumen measuring up to 8 mm. Stomach/Bowel: No small or large bowel wall thickening or dilatation. The appendix is not definitely identified with no inflammatory changes in the right lower quadrant to suggest acute appendicitis. Vasculature/Lymphatics: Severe atherosclerotic plaque. No abdominal aorta or iliac aneurysm. No active contrast extravasation or pseudoaneurysm. No abdominal, pelvic, inguinal lymphadenopathy. Reproductive: Prostate enlarged in size measuring up to 6.2 cm. Other: No simple free fluid ascites. No pneumoperitoneum. No hemoperitoneum. No mesenteric hematoma identified. No organized fluid collection. Musculoskeletal: No significant soft tissue hematoma. Age-indeterminate, possibly acute, displaced right S4 sacral ala fracture with extension to the right S4 and S5 sacral foramina as well as extension to the left S3 sacral ala and left S3 sacral foramina (7:136, 8: 46). Please see separately dictated CT thoracolumbar spine 07/31/2022. No extension to the sacroiliac joints. No pelvic diastasis. No other acute displaced pelvic fracture. Ports and Devices: None. IMPRESSION: 1. Age-indeterminate, possibly acute, displaced bilateral sacral ala fractures extending to the right S4 and S5 as well as left S3 sacral foramina. 2. No  acute intrathoracic,  intra-abdominal, intrapelvic traumatic injury. 3. Please see separately dictated CT thoracolumbar spine as well as CT head and C-spine 07/31/2022. Other imaging findings of potential clinical significance: 1. Interval increase in size of a right superior renal pole mass measuring at least 5 cm (from 3.7 cm) versus interval development of an adjacent new mass. Findings suggestive of malignancy. When the patient is clinically stable and able to follow directions and hold their breath (preferably as an outpatient) further evaluation with dedicated MRI renal protocol should be considered. 2. 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 Recommend annual imaging followup by CTA or MRA. This recommendation follows 2010 ACCF/AHA/AATS/ACR/ASA/SCA/SCAI/SIR/STS/SVM Guidelines for the Diagnosis and Management of Patients with Thoracic Aortic Disease. Circulation. 2010; 121: Y403-K742. Aortic aneurysm NOS (ICD10-I71.9) . 3. Mild cardiomegaly. 4. Enlarged main pulmonary artery suggestive of pulmonary hypertension. 5. Left apical 7 mm subsolid pulmonary nodule. Follow-up non-contrast CT recommended at 3-6 months to confirm persistence. If unchanged, and solid component remains <6 mm, annual CT is recommended until 5 years of stability has been established. If persistent these nodules should be considered highly suspicious if the solid component of the nodule is 6 mm or greater in size and enlarging. This recommendation follows the consensus statement: Guidelines for Management of Incidental Pulmonary Nodules Detected on CT Images: From the Fleischner Society 2017; Radiology 2017; 284:228-243. 6. Low-density right pleural fluid which may represent a trace pleural effusion versus chylothorax. 7. Nonobstructive left nephrolithiasis. 8. Calcified stones within the urinary bladder lumen. Urinary bladder decompressed with suprapubic catheter in appropriate position. 9. Prostatomegaly. 10. Aortic Atherosclerosis (ICD10-I70.0) including  coronary artery calcification and aortic valve leaflet calcifications-correlate for aortic stenosis. Electronically Signed   By: Iven Finn M.D.   On: 07/31/2022 18:45   CT HEAD WO CONTRAST  Result Date: 07/31/2022 CLINICAL DATA:  Trauma EXAM: CT HEAD WITHOUT CONTRAST CT CERVICAL SPINE WITHOUT CONTRAST CT THORACIC SPINE WITHOUT CONTRAST CT LUMBAR SPINE WITHOUT CONTRAST TECHNIQUE: Multidetector CT imaging of the head and cervical, thoracic, and lumbar spine was performed following the standard protocol without intravenous contrast. Multiplanar CT image reconstructions of the cervical spine were also generated. RADIATION DOSE REDUCTION: This exam was performed according to the departmental dose-optimization program which includes automated exposure control, adjustment of the mA and/or kV according to patient size and/or use of iterative reconstruction technique. COMPARISON:  CT Head 05/01/19 FINDINGS: CT HEAD FINDINGS Brain: No evidence of acute infarction, hydrocephalus, or mass lesion/mass effect. There is a subtle hyperdensity along the right tentorial leaflet (series 5, image 49), which is new compared to 05/01/2019, and favored to represent a small amount of extra-axial blood products. There is advanced generalized volume loss. Vascular: No hyperdense vessel or unexpected calcification. Skull: Soft tissue swelling along the right frontal scalp in the left parietal scalp. No evidence of underlying calvarial fracture. Sinuses/Orbits: Bilateral lens replacement. No evidence of mastoid or middle ear effusion. Paranasal sinuses are clear. Other: None CT CERVICAL, THORACIC, LUMBAR SPINE FINDINGS Alignment: Grade 1 anterolisthesis of C5 on C6.  Otherwise normal. Skull base and vertebrae: No evidence SP cervical spine fracture. Multilevel degenerative changes. With likely mild-to-moderate spinal canal stenosis at C3-C4. There has been posterior decompression at the T5-T8 level. No evidence of an acute thoracic  spine fracture. There may be an acute fracture at the right L4 transverse process (series 2, image 54). There is an acute fracture through the S4 vertebral body. Soft tissues and spinal canal: See separately dictated CT chest abdomen and pelvis for soft  tissue findings in the chest abdomen and pelvis. Disc levels: Limitations: Assessment of the spinal canal limited due to CT technique. Within this limitation Cervical spine: Likely mild-to-moderate spinal canal stenosis at C3-C4. Thoracic spine: No evidence of high-grade spinal canal stenosis. Lumbar spine: Congenitally short pedicles. Severe spinal canal stenosis from the L1-L2 level to the L4-L5 levels. There is also severe bilateral neural foraminal stenosis from the L3-L4 to the L5-S1 levels. Upper chest: See separately dictated CT chest abdomen and pelvis. IMPRESSION: CT HEAD: 1. Subtle hyperdensity along the right tentorial leaflet, favored to represent a small amount of extra-axial blood products. Recommend follow up CT to ensure stability. 2. Right frontal and left parietal scalp soft tissue swelling. No evidence of underlying calvarial fracture. CT CERVICAL SPINE: No acute cervical spine fracture. CT THORACIC SPINE: No acute thoracic spine fracture. CT LUMBAR SPINE: 1. Acute fracture through the S4 vertebral body. 2. Possible acute fracture of the right L4 transverse process. 3. Severe spinal canal stenosis from the L1-L2 level to the L4-L5 levels. Severe bilateral neural foraminal stenosis from the L3-L4 to the L5-S1 levels. Electronically Signed   By: Marin Roberts M.D.   On: 07/31/2022 18:08   CT CERVICAL SPINE WO CONTRAST  Result Date: 07/31/2022 CLINICAL DATA:  Trauma EXAM: CT HEAD WITHOUT CONTRAST CT CERVICAL SPINE WITHOUT CONTRAST CT THORACIC SPINE WITHOUT CONTRAST CT LUMBAR SPINE WITHOUT CONTRAST TECHNIQUE: Multidetector CT imaging of the head and cervical, thoracic, and lumbar spine was performed following the standard protocol without  intravenous contrast. Multiplanar CT image reconstructions of the cervical spine were also generated. RADIATION DOSE REDUCTION: This exam was performed according to the departmental dose-optimization program which includes automated exposure control, adjustment of the mA and/or kV according to patient size and/or use of iterative reconstruction technique. COMPARISON:  CT Head 05/01/19 FINDINGS: CT HEAD FINDINGS Brain: No evidence of acute infarction, hydrocephalus, or mass lesion/mass effect. There is a subtle hyperdensity along the right tentorial leaflet (series 5, image 49), which is new compared to 05/01/2019, and favored to represent a small amount of extra-axial blood products. There is advanced generalized volume loss. Vascular: No hyperdense vessel or unexpected calcification. Skull: Soft tissue swelling along the right frontal scalp in the left parietal scalp. No evidence of underlying calvarial fracture. Sinuses/Orbits: Bilateral lens replacement. No evidence of mastoid or middle ear effusion. Paranasal sinuses are clear. Other: None CT CERVICAL, THORACIC, LUMBAR SPINE FINDINGS Alignment: Grade 1 anterolisthesis of C5 on C6.  Otherwise normal. Skull base and vertebrae: No evidence SP cervical spine fracture. Multilevel degenerative changes. With likely mild-to-moderate spinal canal stenosis at C3-C4. There has been posterior decompression at the T5-T8 level. No evidence of an acute thoracic spine fracture. There may be an acute fracture at the right L4 transverse process (series 2, image 54). There is an acute fracture through the S4 vertebral body. Soft tissues and spinal canal: See separately dictated CT chest abdomen and pelvis for soft tissue findings in the chest abdomen and pelvis. Disc levels: Limitations: Assessment of the spinal canal limited due to CT technique. Within this limitation Cervical spine: Likely mild-to-moderate spinal canal stenosis at C3-C4. Thoracic spine: No evidence of high-grade  spinal canal stenosis. Lumbar spine: Congenitally short pedicles. Severe spinal canal stenosis from the L1-L2 level to the L4-L5 levels. There is also severe bilateral neural foraminal stenosis from the L3-L4 to the L5-S1 levels. Upper chest: See separately dictated CT chest abdomen and pelvis. IMPRESSION: CT HEAD: 1. Subtle hyperdensity along the right tentorial  leaflet, favored to represent a small amount of extra-axial blood products. Recommend follow up CT to ensure stability. 2. Right frontal and left parietal scalp soft tissue swelling. No evidence of underlying calvarial fracture. CT CERVICAL SPINE: No acute cervical spine fracture. CT THORACIC SPINE: No acute thoracic spine fracture. CT LUMBAR SPINE: 1. Acute fracture through the S4 vertebral body. 2. Possible acute fracture of the right L4 transverse process. 3. Severe spinal canal stenosis from the L1-L2 level to the L4-L5 levels. Severe bilateral neural foraminal stenosis from the L3-L4 to the L5-S1 levels. Electronically Signed   By: Marin Roberts M.D.   On: 07/31/2022 18:08   CT T-SPINE NO CHARGE  Result Date: 07/31/2022 CLINICAL DATA:  Trauma EXAM: CT HEAD WITHOUT CONTRAST CT CERVICAL SPINE WITHOUT CONTRAST CT THORACIC SPINE WITHOUT CONTRAST CT LUMBAR SPINE WITHOUT CONTRAST TECHNIQUE: Multidetector CT imaging of the head and cervical, thoracic, and lumbar spine was performed following the standard protocol without intravenous contrast. Multiplanar CT image reconstructions of the cervical spine were also generated. RADIATION DOSE REDUCTION: This exam was performed according to the departmental dose-optimization program which includes automated exposure control, adjustment of the mA and/or kV according to patient size and/or use of iterative reconstruction technique. COMPARISON:  CT Head 05/01/19 FINDINGS: CT HEAD FINDINGS Brain: No evidence of acute infarction, hydrocephalus, or mass lesion/mass effect. There is a subtle hyperdensity along the right  tentorial leaflet (series 5, image 49), which is new compared to 05/01/2019, and favored to represent a small amount of extra-axial blood products. There is advanced generalized volume loss. Vascular: No hyperdense vessel or unexpected calcification. Skull: Soft tissue swelling along the right frontal scalp in the left parietal scalp. No evidence of underlying calvarial fracture. Sinuses/Orbits: Bilateral lens replacement. No evidence of mastoid or middle ear effusion. Paranasal sinuses are clear. Other: None CT CERVICAL, THORACIC, LUMBAR SPINE FINDINGS Alignment: Grade 1 anterolisthesis of C5 on C6.  Otherwise normal. Skull base and vertebrae: No evidence SP cervical spine fracture. Multilevel degenerative changes. With likely mild-to-moderate spinal canal stenosis at C3-C4. There has been posterior decompression at the T5-T8 level. No evidence of an acute thoracic spine fracture. There may be an acute fracture at the right L4 transverse process (series 2, image 54). There is an acute fracture through the S4 vertebral body. Soft tissues and spinal canal: See separately dictated CT chest abdomen and pelvis for soft tissue findings in the chest abdomen and pelvis. Disc levels: Limitations: Assessment of the spinal canal limited due to CT technique. Within this limitation Cervical spine: Likely mild-to-moderate spinal canal stenosis at C3-C4. Thoracic spine: No evidence of high-grade spinal canal stenosis. Lumbar spine: Congenitally short pedicles. Severe spinal canal stenosis from the L1-L2 level to the L4-L5 levels. There is also severe bilateral neural foraminal stenosis from the L3-L4 to the L5-S1 levels. Upper chest: See separately dictated CT chest abdomen and pelvis. IMPRESSION: CT HEAD: 1. Subtle hyperdensity along the right tentorial leaflet, favored to represent a small amount of extra-axial blood products. Recommend follow up CT to ensure stability. 2. Right frontal and left parietal scalp soft tissue  swelling. No evidence of underlying calvarial fracture. CT CERVICAL SPINE: No acute cervical spine fracture. CT THORACIC SPINE: No acute thoracic spine fracture. CT LUMBAR SPINE: 1. Acute fracture through the S4 vertebral body. 2. Possible acute fracture of the right L4 transverse process. 3. Severe spinal canal stenosis from the L1-L2 level to the L4-L5 levels. Severe bilateral neural foraminal stenosis from the L3-L4 to the L5-S1  levels. Electronically Signed   By: Marin Roberts M.D.   On: 07/31/2022 18:08   CT L-SPINE NO CHARGE  Result Date: 07/31/2022 CLINICAL DATA:  Trauma EXAM: CT HEAD WITHOUT CONTRAST CT CERVICAL SPINE WITHOUT CONTRAST CT THORACIC SPINE WITHOUT CONTRAST CT LUMBAR SPINE WITHOUT CONTRAST TECHNIQUE: Multidetector CT imaging of the head and cervical, thoracic, and lumbar spine was performed following the standard protocol without intravenous contrast. Multiplanar CT image reconstructions of the cervical spine were also generated. RADIATION DOSE REDUCTION: This exam was performed according to the departmental dose-optimization program which includes automated exposure control, adjustment of the mA and/or kV according to patient size and/or use of iterative reconstruction technique. COMPARISON:  CT Head 05/01/19 FINDINGS: CT HEAD FINDINGS Brain: No evidence of acute infarction, hydrocephalus, or mass lesion/mass effect. There is a subtle hyperdensity along the right tentorial leaflet (series 5, image 49), which is new compared to 05/01/2019, and favored to represent a small amount of extra-axial blood products. There is advanced generalized volume loss. Vascular: No hyperdense vessel or unexpected calcification. Skull: Soft tissue swelling along the right frontal scalp in the left parietal scalp. No evidence of underlying calvarial fracture. Sinuses/Orbits: Bilateral lens replacement. No evidence of mastoid or middle ear effusion. Paranasal sinuses are clear. Other: None CT CERVICAL, THORACIC,  LUMBAR SPINE FINDINGS Alignment: Grade 1 anterolisthesis of C5 on C6.  Otherwise normal. Skull base and vertebrae: No evidence SP cervical spine fracture. Multilevel degenerative changes. With likely mild-to-moderate spinal canal stenosis at C3-C4. There has been posterior decompression at the T5-T8 level. No evidence of an acute thoracic spine fracture. There may be an acute fracture at the right L4 transverse process (series 2, image 54). There is an acute fracture through the S4 vertebral body. Soft tissues and spinal canal: See separately dictated CT chest abdomen and pelvis for soft tissue findings in the chest abdomen and pelvis. Disc levels: Limitations: Assessment of the spinal canal limited due to CT technique. Within this limitation Cervical spine: Likely mild-to-moderate spinal canal stenosis at C3-C4. Thoracic spine: No evidence of high-grade spinal canal stenosis. Lumbar spine: Congenitally short pedicles. Severe spinal canal stenosis from the L1-L2 level to the L4-L5 levels. There is also severe bilateral neural foraminal stenosis from the L3-L4 to the L5-S1 levels. Upper chest: See separately dictated CT chest abdomen and pelvis. IMPRESSION: CT HEAD: 1. Subtle hyperdensity along the right tentorial leaflet, favored to represent a small amount of extra-axial blood products. Recommend follow up CT to ensure stability. 2. Right frontal and left parietal scalp soft tissue swelling. No evidence of underlying calvarial fracture. CT CERVICAL SPINE: No acute cervical spine fracture. CT THORACIC SPINE: No acute thoracic spine fracture. CT LUMBAR SPINE: 1. Acute fracture through the S4 vertebral body. 2. Possible acute fracture of the right L4 transverse process. 3. Severe spinal canal stenosis from the L1-L2 level to the L4-L5 levels. Severe bilateral neural foraminal stenosis from the L3-L4 to the L5-S1 levels. Electronically Signed   By: Marin Roberts M.D.   On: 07/31/2022 18:08   DG Knee 2 Views  Right  Result Date: 07/31/2022 CLINICAL DATA:  Swelling, fall. EXAM: RIGHT KNEE - 1-2 VIEW COMPARISON:  Right knee x-ray 07/27/2022 FINDINGS: Small joint effusion persists. There is prepatellar soft tissue swelling similar to the prior study. There is no acute fracture or dislocation. There is mediolateral compartment degenerative change and chondrocalcinosis similar to prior. Peripheral vascular calcifications are present. IMPRESSION: 1. No acute fracture or dislocation. 2. Small joint effusion and prepatellar  soft tissue swelling similar to prior study. Electronically Signed   By: Ronney Asters M.D.   On: 07/31/2022 17:15   DG Chest Port 1 View  Result Date: 07/31/2022 CLINICAL DATA:  Trauma, fall. EXAM: PORTABLE CHEST 1 VIEW COMPARISON:  Chest x-ray 07/27/2022 FINDINGS: Heart is mildly enlarged, unchanged. The lungs are clear. There is no pleural effusion or pneumothorax. No acute fractures are seen. IMPRESSION: No acute cardiopulmonary process. Electronically Signed   By: Ronney Asters M.D.   On: 07/31/2022 17:14   DG Pelvis Portable  Result Date: 07/31/2022 CLINICAL DATA:  Trauma EXAM: PORTABLE PELVIS 1-2 VIEWS COMPARISON:  Pelvis x-ray 07/27/2022 FINDINGS: There is no evidence of pelvic fracture or diastasis. No pelvic bone lesions are seen. There are moderate degenerative changes of both hips similar to the prior study. IMPRESSION: No acute bony abnormality. Moderate degenerative changes of both hips. Electronically Signed   By: Ronney Asters M.D.   On: 07/31/2022 17:12   DG Clavicle Left  Result Date: 07/27/2022 CLINICAL DATA:  Evaluate for possible clavicle fracture. EXAM: LEFT CLAVICLE - 2+ VIEWS COMPARISON:  CT head and cervical spine earlier same day FINDINGS: No evidence for clavicle fracture. AC joint and glenohumeral joint degenerative changes. Visualized left hemithorax is unremarkable. IMPRESSION: No evidence for clavicle fracture. Degenerative changes. Electronically Signed   By: Lovey Newcomer M.D.   On: 07/27/2022 17:49   DG Shoulder Right Port  Result Date: 07/27/2022 CLINICAL DATA:  Trauma EXAM: RIGHT SHOULDER - 1 VIEW COMPARISON:  None Available. FINDINGS: There is no evidence of fracture or dislocation. There is moderate acromioclavicular and glenohumeral joint space narrowing and osteophyte formation compatible with degenerative change. Soft tissues are unremarkable. IMPRESSION: 1. No evidence of fracture or dislocation. 2. Moderate degenerative changes. Electronically Signed   By: Ronney Asters M.D.   On: 07/27/2022 17:03   DG Knee Right Port  Result Date: 07/27/2022 CLINICAL DATA:  Fall with right knee pain. EXAM: PORTABLE RIGHT KNEE - 1-2 VIEW COMPARISON:  None Available. FINDINGS: Mild tricompartmental osteoarthritic change. Chondrocalcinosis over the lateral compartment. Suggestion of a small joint effusion. No evidence of acute fracture or dislocation. Remainder of the exam is unremarkable. IMPRESSION: 1. No acute fracture. 2. Mild osteoarthritic change. Small joint effusion. Electronically Signed   By: Marin Olp M.D.   On: 07/27/2022 16:25   CT HEAD WO CONTRAST  Result Date: 07/27/2022 CLINICAL DATA:  Trauma. EXAM: CT HEAD WITHOUT CONTRAST CT CERVICAL SPINE WITHOUT CONTRAST TECHNIQUE: Multidetector CT imaging of the head and cervical spine was performed following the standard protocol without intravenous contrast. Multiplanar CT image reconstructions of the cervical spine were also generated. RADIATION DOSE REDUCTION: This exam was performed according to the departmental dose-optimization program which includes automated exposure control, adjustment of the mA and/or kV according to patient size and/or use of iterative reconstruction technique. COMPARISON:  Brain CT 05/01/2019 FINDINGS: CT HEAD FINDINGS Brain: Ventricles and sulci are prominent compatible with atrophy. Periventricular and subcortical white matter hypodensities compatible with chronic microvascular  ischemic changes. No evidence for acute cortically based infarct, intracranial hemorrhage, mass lesion or mass-effect. Left basal ganglia chronic lacunar infarcts. Vascular: No hyperdense vessel or unexpected calcification. Skull: Normal. Negative for fracture or focal lesion. Sinuses/Orbits: Paranasal sinuses well aerated. Mastoid air cells are unremarkable. Orbits are unremarkable. Other: Soft tissue swelling overlying the right frontal calvarium. CT CERVICAL SPINE FINDINGS Alignment: Normal. Skull base and vertebrae: No acute fracture. No primary bone lesion or focal pathologic process. Soft tissues  and spinal canal: No prevertebral fluid or swelling. No visible canal hematoma. Disc levels: Multilevel degenerative disc and facet disease throughout the visualized cervical spine. No evidence for acute fracture. Upper chest: Negative. Other: Can not exclude possible distal left clavicle fracture on scout image. IMPRESSION: 1. No acute intracranial abnormality. Atrophy and chronic microvascular ischemic changes. 2. Soft tissue injury overlying the right frontal calvarium. 3. No acute cervical spine fracture. Multilevel degenerative disc disease. 4. Can not exclude possible distal left clavicle fracture on scout image. Recommend correlation with either chest radiograph or left clavicle radiograph. Electronically Signed   By: Lovey Newcomer M.D.   On: 07/27/2022 16:13   CT CERVICAL SPINE WO CONTRAST  Result Date: 07/27/2022 CLINICAL DATA:  Trauma. EXAM: CT HEAD WITHOUT CONTRAST CT CERVICAL SPINE WITHOUT CONTRAST TECHNIQUE: Multidetector CT imaging of the head and cervical spine was performed following the standard protocol without intravenous contrast. Multiplanar CT image reconstructions of the cervical spine were also generated. RADIATION DOSE REDUCTION: This exam was performed according to the departmental dose-optimization program which includes automated exposure control, adjustment of the mA and/or kV according  to patient size and/or use of iterative reconstruction technique. COMPARISON:  Brain CT 05/01/2019 FINDINGS: CT HEAD FINDINGS Brain: Ventricles and sulci are prominent compatible with atrophy. Periventricular and subcortical white matter hypodensities compatible with chronic microvascular ischemic changes. No evidence for acute cortically based infarct, intracranial hemorrhage, mass lesion or mass-effect. Left basal ganglia chronic lacunar infarcts. Vascular: No hyperdense vessel or unexpected calcification. Skull: Normal. Negative for fracture or focal lesion. Sinuses/Orbits: Paranasal sinuses well aerated. Mastoid air cells are unremarkable. Orbits are unremarkable. Other: Soft tissue swelling overlying the right frontal calvarium. CT CERVICAL SPINE FINDINGS Alignment: Normal. Skull base and vertebrae: No acute fracture. No primary bone lesion or focal pathologic process. Soft tissues and spinal canal: No prevertebral fluid or swelling. No visible canal hematoma. Disc levels: Multilevel degenerative disc and facet disease throughout the visualized cervical spine. No evidence for acute fracture. Upper chest: Negative. Other: Can not exclude possible distal left clavicle fracture on scout image. IMPRESSION: 1. No acute intracranial abnormality. Atrophy and chronic microvascular ischemic changes. 2. Soft tissue injury overlying the right frontal calvarium. 3. No acute cervical spine fracture. Multilevel degenerative disc disease. 4. Can not exclude possible distal left clavicle fracture on scout image. Recommend correlation with either chest radiograph or left clavicle radiograph. Electronically Signed   By: Lovey Newcomer M.D.   On: 07/27/2022 16:13   DG Pelvis Portable  Result Date: 07/27/2022 CLINICAL DATA:  Trauma EXAM: PORTABLE PELVIS 1-2 VIEWS COMPARISON:  None Available. FINDINGS: There is no evidence of displaced pelvic fracture or diastasis. No pelvic bone lesions are seen. Nonobstructive pattern of  overlying bowel gas. IMPRESSION: No displaced pelvic fracture or diastasis. Please note that plain radiographs are significantly insensitive for hip and pelvic fracture. Consider CT to more sensitively evaluate if there is high clinical suspicion for fracture. Electronically Signed   By: Delanna Ahmadi M.D.   On: 07/27/2022 15:31   DG Chest Port 1 View  Result Date: 07/27/2022 CLINICAL DATA:  Trauma, fall EXAM: PORTABLE CHEST 1 VIEW COMPARISON:  12/12/2021 FINDINGS: Cardiomegaly. Both lungs are clear. The visualized skeletal structures are unremarkable. IMPRESSION: Cardiomegaly without acute abnormality of the lungs in AP portable projection. Electronically Signed   By: Delanna Ahmadi M.D.   On: 07/27/2022 15:26    Assessment/Plan 1. Acute pain of right knee ?possible prepatellar bursitis, significant amt of swelling and pain  Will  start doxycycline.  Renew oxycodone for pain Refer to ortho for possible aspiration due to pain   - oxyCODONE (ROXICODONE) 5 MG immediate release tablet; Take 1 tablet (5 mg total) by mouth every 6 (six) hours as needed for severe pain or moderate pain.  Dispense: 30 tablet; Refill: 0 - doxycycline (VIBRA-TABS) 100 MG tablet; Take 1 tablet (100 mg total) by mouth 2 (two) times daily.  Dispense: 14 tablet; Refill: 0  2. Longstanding persistent atrial fibrillation (HCC) D/C coumadin  See discussion below   3. Vascular dementia without behavioral disturbance (HCC) Progressive decline in cognition and physical function c/w the disease. Continue supportive care in the skilled environment.  4. Falls frequently Bed mat Supportive chair Keep under direct supervision   5. Advanced care planning/counseling discussion I met with Mr. Choinski's wife Clem. They have been married for 36 years and she reports that they have had a wonderful life. Her goal is for Mr. Grantham to avoid hospitalizations and not to be in pain. She is not interested in any life sustaining or  aggressive measures. She wishes to possibly pursue hospice once his therapy is complete. We agreed to order oxycodone for pain to his right knee and treat with ortho referral and possible aspiration because this is a source of pain for him and is significantly swollen. I discussed with her that given his risk of CVA he has been on coumadin but now that he is falling and has had an overall functional decline with ER visits (which she wants to avoid) the risk of bleeding outweighs the benefit of CVA risk reduction at this time.  She agreed to discontinue the coumadin. She is not pursuing additional apts with cardiology. We discussed fall prevention. Comfort measures. Went over his entire med list. We agreed to keep the cymbalta at this time to help with any symptoms related to his dementia or underlying depression.   This conversation took approximately 30 min in counseling and coordination.     Family/ staff Communication: discussed with his POA wife Clem

## 2022-08-09 DIAGNOSIS — F01A0 Vascular dementia, mild, without behavioral disturbance, psychotic disturbance, mood disturbance, and anxiety: Secondary | ICD-10-CM | POA: Diagnosis not present

## 2022-08-09 DIAGNOSIS — M6281 Muscle weakness (generalized): Secondary | ICD-10-CM | POA: Diagnosis not present

## 2022-08-09 DIAGNOSIS — G468 Other vascular syndromes of brain in cerebrovascular diseases: Secondary | ICD-10-CM | POA: Diagnosis not present

## 2022-08-09 DIAGNOSIS — M6389 Disorders of muscle in diseases classified elsewhere, multiple sites: Secondary | ICD-10-CM | POA: Diagnosis not present

## 2022-08-09 DIAGNOSIS — R41841 Cognitive communication deficit: Secondary | ICD-10-CM | POA: Diagnosis not present

## 2022-08-09 DIAGNOSIS — S066X0S Traumatic subarachnoid hemorrhage without loss of consciousness, sequela: Secondary | ICD-10-CM | POA: Diagnosis not present

## 2022-08-11 ENCOUNTER — Telehealth: Payer: Self-pay | Admitting: Family Medicine

## 2022-08-11 DIAGNOSIS — S066X0S Traumatic subarachnoid hemorrhage without loss of consciousness, sequela: Secondary | ICD-10-CM | POA: Diagnosis not present

## 2022-08-11 DIAGNOSIS — R41841 Cognitive communication deficit: Secondary | ICD-10-CM | POA: Diagnosis not present

## 2022-08-11 DIAGNOSIS — F01A0 Vascular dementia, mild, without behavioral disturbance, psychotic disturbance, mood disturbance, and anxiety: Secondary | ICD-10-CM | POA: Diagnosis not present

## 2022-08-11 DIAGNOSIS — M6281 Muscle weakness (generalized): Secondary | ICD-10-CM | POA: Diagnosis not present

## 2022-08-11 DIAGNOSIS — G468 Other vascular syndromes of brain in cerebrovascular diseases: Secondary | ICD-10-CM | POA: Diagnosis not present

## 2022-08-11 DIAGNOSIS — M6389 Disorders of muscle in diseases classified elsewhere, multiple sites: Secondary | ICD-10-CM | POA: Diagnosis not present

## 2022-08-11 NOTE — Telephone Encounter (Signed)
Reviewed note from referring provider team indicating that wife has determined when pt is ready for d/c from SNF to seek out Hospice Care for pt.  He had a recent ED visit for a fall at SNF, hitting his head and getting a small SDH.  After consultation with Neurology it was determined that the bleed was so small they would not reverse his Coumadin.  Wife's goals for spouse are for comfort care without transport to the hospital and she was agreeable with facility NP stopping the Coumadin for him.  Tried both home number which rang and was never picked up and mobile number for wife which says she doesn't have a voice mailbox set up. Damaris Hippo FNP-C

## 2022-08-12 DIAGNOSIS — M6389 Disorders of muscle in diseases classified elsewhere, multiple sites: Secondary | ICD-10-CM | POA: Diagnosis not present

## 2022-08-12 DIAGNOSIS — F01A0 Vascular dementia, mild, without behavioral disturbance, psychotic disturbance, mood disturbance, and anxiety: Secondary | ICD-10-CM | POA: Diagnosis not present

## 2022-08-12 DIAGNOSIS — G468 Other vascular syndromes of brain in cerebrovascular diseases: Secondary | ICD-10-CM | POA: Diagnosis not present

## 2022-08-12 DIAGNOSIS — R41841 Cognitive communication deficit: Secondary | ICD-10-CM | POA: Diagnosis not present

## 2022-08-12 DIAGNOSIS — M6281 Muscle weakness (generalized): Secondary | ICD-10-CM | POA: Diagnosis not present

## 2022-08-12 DIAGNOSIS — S066X0S Traumatic subarachnoid hemorrhage without loss of consciousness, sequela: Secondary | ICD-10-CM | POA: Diagnosis not present

## 2022-08-13 DIAGNOSIS — S066X0S Traumatic subarachnoid hemorrhage without loss of consciousness, sequela: Secondary | ICD-10-CM | POA: Diagnosis not present

## 2022-08-13 DIAGNOSIS — F01A0 Vascular dementia, mild, without behavioral disturbance, psychotic disturbance, mood disturbance, and anxiety: Secondary | ICD-10-CM | POA: Diagnosis not present

## 2022-08-13 DIAGNOSIS — G468 Other vascular syndromes of brain in cerebrovascular diseases: Secondary | ICD-10-CM | POA: Diagnosis not present

## 2022-08-13 DIAGNOSIS — M6389 Disorders of muscle in diseases classified elsewhere, multiple sites: Secondary | ICD-10-CM | POA: Diagnosis not present

## 2022-08-13 DIAGNOSIS — R41841 Cognitive communication deficit: Secondary | ICD-10-CM | POA: Diagnosis not present

## 2022-08-13 DIAGNOSIS — M6281 Muscle weakness (generalized): Secondary | ICD-10-CM | POA: Diagnosis not present

## 2022-08-14 DIAGNOSIS — S066X0S Traumatic subarachnoid hemorrhage without loss of consciousness, sequela: Secondary | ICD-10-CM | POA: Diagnosis not present

## 2022-08-14 DIAGNOSIS — G468 Other vascular syndromes of brain in cerebrovascular diseases: Secondary | ICD-10-CM | POA: Diagnosis not present

## 2022-08-14 DIAGNOSIS — R41841 Cognitive communication deficit: Secondary | ICD-10-CM | POA: Diagnosis not present

## 2022-08-14 DIAGNOSIS — M6281 Muscle weakness (generalized): Secondary | ICD-10-CM | POA: Diagnosis not present

## 2022-08-14 DIAGNOSIS — F01A0 Vascular dementia, mild, without behavioral disturbance, psychotic disturbance, mood disturbance, and anxiety: Secondary | ICD-10-CM | POA: Diagnosis not present

## 2022-08-14 DIAGNOSIS — M6389 Disorders of muscle in diseases classified elsewhere, multiple sites: Secondary | ICD-10-CM | POA: Diagnosis not present

## 2022-08-15 DIAGNOSIS — M6281 Muscle weakness (generalized): Secondary | ICD-10-CM | POA: Diagnosis not present

## 2022-08-15 DIAGNOSIS — R41841 Cognitive communication deficit: Secondary | ICD-10-CM | POA: Diagnosis not present

## 2022-08-15 DIAGNOSIS — M6389 Disorders of muscle in diseases classified elsewhere, multiple sites: Secondary | ICD-10-CM | POA: Diagnosis not present

## 2022-08-15 DIAGNOSIS — F01A0 Vascular dementia, mild, without behavioral disturbance, psychotic disturbance, mood disturbance, and anxiety: Secondary | ICD-10-CM | POA: Diagnosis not present

## 2022-08-15 DIAGNOSIS — G468 Other vascular syndromes of brain in cerebrovascular diseases: Secondary | ICD-10-CM | POA: Diagnosis not present

## 2022-08-15 DIAGNOSIS — S066X0S Traumatic subarachnoid hemorrhage without loss of consciousness, sequela: Secondary | ICD-10-CM | POA: Diagnosis not present

## 2022-08-18 DIAGNOSIS — M6281 Muscle weakness (generalized): Secondary | ICD-10-CM | POA: Diagnosis not present

## 2022-08-18 DIAGNOSIS — F01A0 Vascular dementia, mild, without behavioral disturbance, psychotic disturbance, mood disturbance, and anxiety: Secondary | ICD-10-CM | POA: Diagnosis not present

## 2022-08-18 DIAGNOSIS — G468 Other vascular syndromes of brain in cerebrovascular diseases: Secondary | ICD-10-CM | POA: Diagnosis not present

## 2022-08-18 DIAGNOSIS — R41841 Cognitive communication deficit: Secondary | ICD-10-CM | POA: Diagnosis not present

## 2022-08-18 DIAGNOSIS — S066X0S Traumatic subarachnoid hemorrhage without loss of consciousness, sequela: Secondary | ICD-10-CM | POA: Diagnosis not present

## 2022-08-18 DIAGNOSIS — M6389 Disorders of muscle in diseases classified elsewhere, multiple sites: Secondary | ICD-10-CM | POA: Diagnosis not present

## 2022-08-19 ENCOUNTER — Non-Acute Institutional Stay (SKILLED_NURSING_FACILITY): Payer: Medicare Other | Admitting: Orthopedic Surgery

## 2022-08-19 ENCOUNTER — Encounter: Payer: Self-pay | Admitting: Orthopedic Surgery

## 2022-08-19 DIAGNOSIS — M6281 Muscle weakness (generalized): Secondary | ICD-10-CM | POA: Diagnosis not present

## 2022-08-19 DIAGNOSIS — M6389 Disorders of muscle in diseases classified elsewhere, multiple sites: Secondary | ICD-10-CM | POA: Diagnosis not present

## 2022-08-19 DIAGNOSIS — F01A0 Vascular dementia, mild, without behavioral disturbance, psychotic disturbance, mood disturbance, and anxiety: Secondary | ICD-10-CM | POA: Diagnosis not present

## 2022-08-19 DIAGNOSIS — S066X0S Traumatic subarachnoid hemorrhage without loss of consciousness, sequela: Secondary | ICD-10-CM | POA: Diagnosis not present

## 2022-08-19 DIAGNOSIS — M25461 Effusion, right knee: Secondary | ICD-10-CM

## 2022-08-19 DIAGNOSIS — G468 Other vascular syndromes of brain in cerebrovascular diseases: Secondary | ICD-10-CM | POA: Diagnosis not present

## 2022-08-19 DIAGNOSIS — R41841 Cognitive communication deficit: Secondary | ICD-10-CM | POA: Diagnosis not present

## 2022-08-19 NOTE — Progress Notes (Signed)
Location:   [place] Room Number: 142A Place of Service:  SNF [phone]) Provider:  Windell Moulding, NP  [name], MD  [name], [name] [name], [name]  Extended Emergency Contact Information Primary Emergency Contact: Nowack,[name] Address: [address]          [place], [place] [address] [name] of [place] Phone: [phone] Mobile Phone: [phone] Relation: Spouse Secondary Emergency Contact: [name],[name] Address: [address]          [place], [place] [address] [place] Mobile Phone: [phone] Relation: Daughter  Code Status:  DNR Goals of care: Advanced Directive information    08/19/2022   10:23 AM  Advanced Directives  Does Patient Have a Medical Advance Directive? Yes  Type of Advance Directive Out of facility DNR (pink MOST or yellow form);Living will  Does patient want to make changes to medical advance directive? No - Patient declined  Pre-existing out of facility DNR order (yellow form or pink MOST form) Pink MOST form placed in chart (order not valid for inpatient use);Yellow form placed in chart (order not valid for inpatient use)     Chief Complaint  Patient presents with   Acute Visit    Right knee swelling    HPI:  Pt is a 89 y.o. male seen today for an acute visit for right knee swelling.   01/21 he was sent to the ED due to mechanical fall and laceration to right forehead. CT head unremarkable. Coumadin discontinued due to frequent falls.  Right knee was noted to be painful, swollen and with several skin tears at that time. 02/01 right knee continued to be painful and swollen, he was prescribed doxycycline and oxycodone for increased pain and concerns for infection. Today, nursing reports right knee swelling has not subsided. He is a poor historian due to dementia. He denies  pain to right knee. Afebrile. Vitals stable.   Treatment options discussed with wife, recommend ortho consult to discuss aspiration of fluid. Wife does not want aggressive treatment, but comfort based care.    Past Medical History:  Diagnosis Date   Adult failure to thrive    Per incoming records from [facility]   Allergic rhinitis    Per incoming records from [facility]   Atrial fibrillation [facility])    BPH (benign prostatic hyperplasia)    Per incoming records from [place] of kidney [facility])    Right, Per incoming records from [place]    Per incoming records from [facility]   Cerebrovascular accident, late effects    Per incoming records from [place] kidney disease, stage III (moderate) ([place])    Per incoming records from [facility])    Per incoming records from [facility]   Cognitive impairment    Mild, Per incoming records from [facility]   Constipation    Per incoming records from [facility]   Dementia [facility])    Diarrhea    Better off Aricept, Per incoming records from [facility]   Diastolic dysfunction    on 2D Echo 07/2009 and 2016, Per incoming records from [facility]   Diverticulosis    Mild, Per incoming records from [facility]   Diverticulosis of  colon (without mention of hemorrhage)    ED (erectile dysfunction)    Per incoming records from [facility]   History of Coumadin therapy    Per incoming records from [facility]   History of echocardiogram 09/2014   Per incoming records from [facility]   Hyperlipemia    Hypertension    Kidney mass    Per incoming records from [place] leg edema    Per incoming records from  [facility])    Neuropathy    Per incoming records from [facility]   OA (osteoarthritis)    Per incoming records from [facility]   Peripheral neuropathy    Per incoming records from [place] history of colonic [place] & 2004   adenomatous polyps   Pulmonary arterial hypertension ([place])    Per incoming records from [place]    Per incoming records from [facility]   Thrombocytopenia [facility] 2017   Per incoming records from [facility]   UTI (urinary tract infection)    Sepsis, Per incoming records from [place] hypertrophy    [place] Rile as ambulation aid    Per incoming records from [facility]   Past Surgical History:  Procedure Laterality Date   APPENDECTOMY     Per incoming records from [place]     Per incoming records from [facility]   IR [place]  11/21/2019   KNEE SURGERY     right   PILONIDAL CYST DRAINAGE     POLYPECTOMY     Per incoming records from [place] tumor lumbar spine     SPINE SURGERY     tumor removed   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     Allergies as of 08/19/2022       Reactions   Penicillins Rash   Has patient had a PCN reaction causing immediate rash, facial/tongue/throat swelling, SOB or lightheadedness with hypotension: unknown Has patient had a PCN reaction causing severe rash involving mucus membranes or skin necrosis: unknown Has patient had a PCN reaction that required hospitalization : unknown Has patient had a PCN reaction occurring within the last 10 years: no If all of the above answers are "NO", then may proceed with Cephalosporin use.        Medication List        Accurate as of August 19, 2022 10:35 AM. If you have any questions, ask your nurse or doctor.          STOP taking these medications    doxycycline 100 MG tablet Commonly known as: VIBRA-TABS Stopped by: Yvonna Alanis, NP  TAKE these medications    acetaminophen 500 MG tablet Commonly known as: TYLENOL Take 1,000 mg by mouth 2 (two) times daily.   cholecalciferol 25 MCG (1000 UNIT) tablet Commonly known as: VITAMIN D3 Take 2,000 Units by mouth daily.   Cranberry 400 MG Caps Take 400 mg by mouth every morning. 400 mg, oral, Once a morning   DULoxetine 20 MG capsule Commonly known as: CYMBALTA Take 20 mg by mouth daily.   furosemide 20 MG tablet Commonly known as: LASIX Take 2 tablets (40 mg total) by mouth daily. Take extra 45m for weight gain of 2-3lbs in 1 day or 5 lbs in 1 week   gabapentin 100 MG capsule Commonly known as: NEURONTIN Take 200 mg by mouth daily. In the morning 8 am-11 am, see other listing for bedtime dosing   gabapentin 100 MG capsule Commonly known as: NEURONTIN Take 200 mg by mouth at bedtime.   ketoconazole 2 % shampoo Commonly known as: NIZORAL Apply 1 Application topically 2 (two) times a week.   oxyCODONE 5 MG immediate release tablet Commonly known as: Oxy IR/ROXICODONE Take 5 mg by mouth every 6 (six) hours as needed for severe pain. What changed:  Another medication with the same name was removed. Continue taking this medication, and follow the directions you see here. Changed by: AYvonna Alanis NP   oxyCODONE 5 MG immediate release tablet Commonly known as: Oxy IR/ROXICODONE Take 5 mg by mouth daily. For pain before transfer What changed: Another medication with the same name was removed. Continue taking this medication, and follow the directions you see here. Changed by: AYvonna Alanis NP   polyethylene glycol 17 g packet Commonly known as: MIRALAX / GLYCOLAX Take 17 g by mouth daily.   potassium chloride 10 MEQ CR capsule Commonly known as: MICRO-K Take 10 mEq by mouth every Tuesday, Thursday, and Saturday at 6 PM. Every Tues., Thurs., Sat. 7:00 am - 3:00 pm, per [facility]   triamcinolone lotion 0.1 % Commonly known as: KENALOG Apply 1 application. topically 2 (two) times daily as needed (rash on back).        Review of Systems  Unable to perform ROS: Dementia    Immunization History  Administered Date(s) Administered   DTaP 01/20/2022   Influenza, High Dose Seasonal PF 05/04/2020, 04/10/2021   Influenza-Unspecified 04/17/2014, 05/01/2016, 04/15/2019, 04/11/2022   Moderna Covid-19 Vaccine Bivalent Booster 148yr& up 04/17/2021   Moderna SARS-COV2 Booster Vaccination 05/17/2020, 05/12/2022   Moderna Sars-Covid-2 Vaccination 07/12/2019, 08/09/2019, 11/03/2021   Pneumococcal Conjugate-13 04/20/2014   Pneumococcal Polysaccharide-23 07/08/2003, 03/31/2011   Pneumococcal-Unspecified 01/26/2004   Td 08/23/2007   Tdap 01/07/2012, 01/20/2022   Zoster Recombinat (Shingrix) 10/05/2017, 12/09/2017   Zoster, Live 07/17/2005   Pertinent  Health Maintenance Due  Topic Date Due   INFLUENZA VACCINE  Completed      12/14/2021    8:00 AM 12/15/2021    9:00 AM 12/16/2021   10:00 AM 05/27/2022    1:32 PM 07/30/2022    2:31 PM  Fall Risk  Falls in the past year?    0 0  Was there an injury with Fall?    0 0  Fall  Risk Category Calculator    0 0  Fall Risk Category (Retired)    Low   (RETIRED) Patient Fall Risk Level High fall risk High fall risk High fall risk Low fall risk   Patient at Risk for Falls Due to     History  of fall(s)  Fall risk Follow up     Falls evaluation completed   Functional Status Survey:    Vitals:   08/19/22 1016  BP: 130/60  Pulse: 76  Resp: (!) 21  Temp: (!) 97.3 F (36.3 C)  SpO2: 96%  Weight: 257 lb 9.6 oz (116.8 kg)  Height: 6' 7"$  (2.007 m)   Body mass index is 29.02 kg/m. Physical Exam Vitals reviewed.  Constitutional:      General: He is not in acute distress. HENT:     Head: Normocephalic.  Eyes:     General:        Right eye: No discharge.        Left eye: No discharge.  Cardiovascular:     Rate and Rhythm: Normal rate. Rhythm irregular.     Pulses: Normal pulses.     Heart sounds: Normal heart sounds.  Pulmonary:     Effort: Pulmonary effort is normal. No respiratory distress.     Breath sounds: Normal breath sounds. No wheezing.  Musculoskeletal:     Cervical back: Neck supple.     Right knee: Swelling and effusion present. No erythema. Normal range of motion. No tenderness.     Right lower leg: No edema.     Left lower leg: No edema.     Comments: No surgical scar to right knee  Skin:    General: Skin is warm.     Capillary Refill: Capillary refill takes less than 2 seconds.     Comments: Multiple scabs to right patella, CDI, surrounding skin intact  Neurological:     General: No focal deficit present.     Mental Status: He is alert. Mental status is at baseline.  Psychiatric:        Mood and Affect: Mood normal.        Behavior: Behavior normal.     Labs reviewed: Recent Labs    07/27/22 1503 07/31/22 1655 08/01/22 0300  NA 140 137  140 138  K 4.5 4.9  5.0 4.2  CL 107 104  106 106  CO2 26 24 22  $ GLUCOSE 107* 172*  165* 143*  BUN 26* 30*  33* 27*  CREATININE 1.54* 1.57*  1.60* 1.33*  CALCIUM 10.4* 10.2 9.8    Recent Labs    07/27/22 1503 07/31/22 1655 08/01/22 0300  AST 42* 25 21  ALT 8 11 11  $ ALKPHOS 100 96 80  BILITOT 0.9 1.3* 1.1  PROT 6.7 6.7 6.0*  ALBUMIN 3.8 3.9 3.3*   Recent Labs    12/11/21 2010 12/12/21 0650 05/05/22 0000 05/23/22 1657 06/09/22 0815 07/27/22 1503 07/31/22 1655 08/01/22 0300  WBC 5.1   < > 2.1 2.3   < > 2.5* 3.1* 3.9*  NEUTROABS 4.1  --  1.00 1.30  --   --   --   --   HGB 13.2   < > 11.9* 10.5*   < > 11.3* 10.6*  11.2* 9.2*  HCT 43.1   < > 37* 33*   < > 38.9* 34.9*  33.0* 30.0*  MCV 90.5   < >  --   --   --  94.2 91.8 90.4  PLT 88*   < > 77* 18*   < > 74* 73* 67*   < > = values in this interval not displayed.   Lab Results  Component Value Date   TSH 0.84 05/19/2019   Lab Results  Component Value Date   HGBA1C 6 08/22/2019  Lab Results  Component Value Date   CHOL 129 08/09/2020   HDL 32 (A) 08/09/2020   LDLCALC 80 08/09/2020   TRIG 86 08/09/2020    Significant Diagnostic Results in last 30 days:  CT HEAD WO CONTRAST (5MM)  Result Date: 08/01/2022 CLINICAL DATA:  Known hemorrhage EXAM: CT HEAD WITHOUT CONTRAST TECHNIQUE: Contiguous axial images were obtained from the base of the skull through the vertex without intravenous contrast. RADIATION DOSE REDUCTION: This exam was performed according to the departmental dose-optimization program which includes automated exposure control, adjustment of the mA and/or kV according to patient size and/or use of iterative reconstruction technique. COMPARISON:  CT head 1 day prior FINDINGS: Brain: Small volume subarachnoid hemorrhage along the underside of the right temporal lobe is unchanged (5-43). There is no edema or mass effect. There is no other acute intracranial hemorrhage or extra-axial fluid collection. There is no evidence of acute infarct. Parenchymal volume is stable. The ventricles are stable in size. Gray-white differentiation is preserved. Pituitary and suprasellar region are normal. There  is no mass lesion or midline shift. Vascular: There is calcification of the bilateral carotid siphons and vertebral arteries. Skull: Normal. Negative for fracture or focal lesion. Sinuses/Orbits: The paranasal sinuses are clear. Bilateral lens implants are in place. The globes and orbits are otherwise unremarkable. Other: Right frontal and left parietal scalp swelling is again seen. IMPRESSION: Stable small volume subarachnoid hemorrhage overlying the right temporal lobe. No new acute intracranial pathology. Electronically Signed   By: Valetta Mole M.D.   On: 08/01/2022 08:07   CT CHEST ABDOMEN PELVIS W CONTRAST  Result Date: 07/31/2022 CLINICAL DATA:  Polytrauma, blunt [id number] Trauma [id number] EXAM: CT CHEST, ABDOMEN, AND PELVIS WITH CONTRAST TECHNIQUE: Multidetector CT imaging of the chest, abdomen and pelvis was performed following the standard protocol during bolus administration of intravenous contrast. RADIATION DOSE REDUCTION: This exam was performed according to the departmental dose-optimization program which includes automated exposure control, adjustment of the mA and/or kV according to patient size and/or use of iterative reconstruction technique. CONTRAST:  38m OMNIPAQUE IOHEXOL 350 MG/ML SOLN COMPARISON:  CT abdomen pelvis 06/10/2018, CT abdomen pelvis 06/17/2019 FINDINGS: CHEST: Cardiovascular: No aortic injury. The ascending thoracic aorta is enlarged in caliber measuring up to 4.3 cm. The descending thoracic aorta is normal in caliber. Severe atherosclerotic plaque. Aortic valve leaflet calcification. At least 2 vessel coronary artery calcification. The heart is mildly enlarged in size. No significant pericardial effusion. The main pulmonary artery is enlarged in caliber measuring up to 3.5 cm. No main pulmonary embolus. Mediastinum/Nodes: No pneumomediastinum. No mediastinal hematoma. The esophagus is unremarkable. The thyroid is unremarkable. The central airways are patent. No mediastinal, hilar,  or axillary lymphadenopathy. Lungs/Pleura: Diffuse bronchial wall thickening. No focal consolidation. Left apical 7 mm subsolid pulmonary nodule (5:5). Slightly more inferior 5 mm solid left upper lobe pulmonary nodule (5:53). Right middle lobe 5 mm ground-glass airspace opacity (5:101). No pulmonary mass. No pulmonary contusion or laceration. No pneumatocele formation. Low-density right pleural fluid which may represent a trace pleural effusion versus chylothorax. No pneumothorax. No hemothorax. Musculoskeletal/Chest wall: No chest wall mass. No acute rib or sternal fracture. Please see separately dictated CT thoracolumbar spine 07/31/2022. ABDOMEN / PELVIS: Hepatobiliary: Not enlarged. No focal lesion. No laceration or subcapsular hematoma. The gallbladder is otherwise unremarkable with no radio-opaque gallstones. No biliary ductal dilatation. Pancreas: Normal pancreatic contour. No main pancreatic duct dilatation. Spleen: Not enlarged. No focal lesion. No laceration, subcapsular hematoma, or vascular injury. Adrenals/Urinary Tract:  No nodularity bilaterally. Nonspecific bilateral perinephric fat stranding. Bilateral kidneys enhance symmetrically. Interval increase in size of a right superior renal pole mass measuring at least 5 cm (from 3.7 cm) versus interval development of an adjacent new mass. Punctate left nephrolithiasis. No hydronephrosis. No contusion, laceration, or subcapsular hematoma. No injury to the vascular structures or collecting systems. No hydroureter. Suprapubic catheter terminates within in the collapsed urinary bladder lumen. Several calcified stones are noted within the gallbladder lumen measuring up to 8 mm. Stomach/Bowel: No small or large bowel wall thickening or dilatation. The appendix is not definitely identified with no inflammatory changes in the right lower quadrant to suggest acute appendicitis. Vasculature/Lymphatics: Severe atherosclerotic plaque. No abdominal aorta or iliac  aneurysm. No active contrast extravasation or pseudoaneurysm. No abdominal, pelvic, inguinal lymphadenopathy. Reproductive: Prostate enlarged in size measuring up to 6.2 cm. Other: No simple free fluid ascites. No pneumoperitoneum. No hemoperitoneum. No mesenteric hematoma identified. No organized fluid collection. Musculoskeletal: No significant soft tissue hematoma. Age-indeterminate, possibly acute, displaced right S4 sacral ala fracture with extension to the right S4 and S5 sacral foramina as well as extension to the left S3 sacral ala and left S3 sacral foramina (7:136, 8: 46). Please see separately dictated CT thoracolumbar spine 07/31/2022. No extension to the sacroiliac joints. No pelvic diastasis. No other acute displaced pelvic fracture. Ports and Devices: None. IMPRESSION: 1. Age-indeterminate, possibly acute, displaced bilateral sacral ala fractures extending to the right S4 and S5 as well as left S3 sacral foramina. 2. No acute intrathoracic, intra-abdominal, intrapelvic traumatic injury. 3. Please see separately dictated CT thoracolumbar spine as well as CT head and C-spine 07/31/2022. Other imaging findings of potential clinical significance: 1. Interval increase in size of a right superior renal pole mass measuring at least 5 cm (from 3.7 cm) versus interval development of an adjacent new mass. Findings suggestive of malignancy. When the patient is clinically stable and able to follow directions and hold their breath (preferably as an outpatient) further evaluation with dedicated MRI renal protocol should be considered. 2. 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 Recommend annual imaging followup by CTA or MRA. This recommendation follows 2010 ACCF/AHA/AATS/ACR/ASA/SCA/SCAI/SIR/STS/SVM Guidelines for the Diagnosis and Management of Patients with Thoracic Aortic Disease. Circulation. 2010; [id number]. Aortic aneurysm NOS (ICD10-I71.9) . 3. Mild cardiomegaly. 4. Enlarged main pulmonary artery  suggestive of pulmonary hypertension. 5. Left apical 7 mm subsolid pulmonary nodule. Follow-up non-contrast CT recommended at 3-6 months to confirm persistence. If unchanged, and solid component remains <6 mm, annual CT is recommended until 5 years of stability has been established. If persistent these nodules should be considered highly suspicious if the solid component of the nodule is 6 mm or greater in size and enlarging. This recommendation follows the consensus statement: Guidelines for Management of Incidental Pulmonary Nodules Detected on CT Images: From the Fleischner Society 2017; Radiology 2017; 284:228-243. 6. Low-density right pleural fluid which may represent a trace pleural effusion versus chylothorax. 7. Nonobstructive left nephrolithiasis. 8. Calcified stones within the urinary bladder lumen. Urinary bladder decompressed with suprapubic catheter in appropriate position. 9. Prostatomegaly. 10. Aortic Atherosclerosis (ICD10-I70.0) including coronary artery calcification and aortic valve leaflet calcifications-correlate for aortic stenosis. Electronically Signed   By: Iven Finn M.D.   On: 07/31/2022 18:45   CT HEAD WO CONTRAST  Result Date: 07/31/2022 CLINICAL DATA:  Trauma EXAM: CT HEAD WITHOUT CONTRAST CT CERVICAL SPINE WITHOUT CONTRAST CT THORACIC SPINE WITHOUT CONTRAST CT LUMBAR SPINE WITHOUT CONTRAST TECHNIQUE: Multidetector CT imaging of the head  and cervical, thoracic, and lumbar spine was performed following the standard protocol without intravenous contrast. Multiplanar CT image reconstructions of the cervical spine were also generated. RADIATION DOSE REDUCTION: This exam was performed according to the departmental dose-optimization program which includes automated exposure control, adjustment of the mA and/or kV according to patient size and/or use of iterative reconstruction technique. COMPARISON:  CT Head 05/01/19 FINDINGS: CT HEAD FINDINGS Brain: No evidence of acute infarction,  hydrocephalus, or mass lesion/mass effect. There is a subtle hyperdensity along the right tentorial leaflet (series 5, image 49), which is new compared to 05/01/2019, and favored to represent a small amount of extra-axial blood products. There is advanced generalized volume loss. Vascular: No hyperdense vessel or unexpected calcification. Skull: Soft tissue swelling along the right frontal scalp in the left parietal scalp. No evidence of underlying calvarial fracture. Sinuses/Orbits: Bilateral lens replacement. No evidence of mastoid or middle ear effusion. Paranasal sinuses are clear. Other: None CT CERVICAL, THORACIC, LUMBAR SPINE FINDINGS Alignment: Grade 1 anterolisthesis of C5 on C6.  Otherwise normal. Skull base and vertebrae: No evidence SP cervical spine fracture. Multilevel degenerative changes. With likely mild-to-moderate spinal canal stenosis at C3-C4. There has been posterior decompression at the T5-T8 level. No evidence of an acute thoracic spine fracture. There may be an acute fracture at the right L4 transverse process (series 2, image 54). There is an acute fracture through the S4 vertebral body. Soft tissues and spinal canal: See separately dictated CT chest abdomen and pelvis for soft tissue findings in the chest abdomen and pelvis. Disc levels: Limitations: Assessment of the spinal canal limited due to CT technique. Within this limitation Cervical spine: Likely mild-to-moderate spinal canal stenosis at C3-C4. Thoracic spine: No evidence of high-grade spinal canal stenosis. Lumbar spine: Congenitally short pedicles. Severe spinal canal stenosis from the L1-L2 level to the L4-L5 levels. There is also severe bilateral neural foraminal stenosis from the L3-L4 to the L5-S1 levels. Upper chest: See separately dictated CT chest abdomen and pelvis. IMPRESSION: CT HEAD: 1. Subtle hyperdensity along the right tentorial leaflet, favored to represent a small amount of extra-axial blood products. Recommend  follow up CT to ensure stability. 2. Right frontal and left parietal scalp soft tissue swelling. No evidence of underlying calvarial fracture. CT CERVICAL SPINE: No acute cervical spine fracture. CT THORACIC SPINE: No acute thoracic spine fracture. CT LUMBAR SPINE: 1. Acute fracture through the S4 vertebral body. 2. Possible acute fracture of the right L4 transverse process. 3. Severe spinal canal stenosis from the L1-L2 level to the L4-L5 levels. Severe bilateral neural foraminal stenosis from the L3-L4 to the L5-S1 levels. Electronically Signed   By: Marin Roberts M.D.   On: 07/31/2022 18:08   CT CERVICAL SPINE WO CONTRAST  Result Date: 07/31/2022 CLINICAL DATA:  Trauma EXAM: CT HEAD WITHOUT CONTRAST CT CERVICAL SPINE WITHOUT CONTRAST CT THORACIC SPINE WITHOUT CONTRAST CT LUMBAR SPINE WITHOUT CONTRAST TECHNIQUE: Multidetector CT imaging of the head and cervical, thoracic, and lumbar spine was performed following the standard protocol without intravenous contrast. Multiplanar CT image reconstructions of the cervical spine were also generated. RADIATION DOSE REDUCTION: This exam was performed according to the departmental dose-optimization program which includes automated exposure control, adjustment of the mA and/or kV according to patient size and/or use of iterative reconstruction technique. COMPARISON:  CT Head 05/01/19 FINDINGS: CT HEAD FINDINGS Brain: No evidence of acute infarction, hydrocephalus, or mass lesion/mass effect. There is a subtle hyperdensity along the right tentorial leaflet (series 5, image 49),  which is new compared to 05/01/2019, and favored to represent a small amount of extra-axial blood products. There is advanced generalized volume loss. Vascular: No hyperdense vessel or unexpected calcification. Skull: Soft tissue swelling along the right frontal scalp in the left parietal scalp. No evidence of underlying calvarial fracture. Sinuses/Orbits: Bilateral lens replacement. No evidence of  mastoid or middle ear effusion. Paranasal sinuses are clear. Other: None CT CERVICAL, THORACIC, LUMBAR SPINE FINDINGS Alignment: Grade 1 anterolisthesis of C5 on C6.  Otherwise normal. Skull base and vertebrae: No evidence SP cervical spine fracture. Multilevel degenerative changes. With likely mild-to-moderate spinal canal stenosis at C3-C4. There has been posterior decompression at the T5-T8 level. No evidence of an acute thoracic spine fracture. There may be an acute fracture at the right L4 transverse process (series 2, image 54). There is an acute fracture through the S4 vertebral body. Soft tissues and spinal canal: See separately dictated CT chest abdomen and pelvis for soft tissue findings in the chest abdomen and pelvis. Disc levels: Limitations: Assessment of the spinal canal limited due to CT technique. Within this limitation Cervical spine: Likely mild-to-moderate spinal canal stenosis at C3-C4. Thoracic spine: No evidence of high-grade spinal canal stenosis. Lumbar spine: Congenitally short pedicles. Severe spinal canal stenosis from the L1-L2 level to the L4-L5 levels. There is also severe bilateral neural foraminal stenosis from the L3-L4 to the L5-S1 levels. Upper chest: See separately dictated CT chest abdomen and pelvis. IMPRESSION: CT HEAD: 1. Subtle hyperdensity along the right tentorial leaflet, favored to represent a small amount of extra-axial blood products. Recommend follow up CT to ensure stability. 2. Right frontal and left parietal scalp soft tissue swelling. No evidence of underlying calvarial fracture. CT CERVICAL SPINE: No acute cervical spine fracture. CT THORACIC SPINE: No acute thoracic spine fracture. CT LUMBAR SPINE: 1. Acute fracture through the S4 vertebral body. 2. Possible acute fracture of the right L4 transverse process. 3. Severe spinal canal stenosis from the L1-L2 level to the L4-L5 levels. Severe bilateral neural foraminal stenosis from the L3-L4 to the L5-S1 levels.  Electronically Signed   By: Marin Roberts M.D.   On: 07/31/2022 18:08   CT T-SPINE NO CHARGE  Result Date: 07/31/2022 CLINICAL DATA:  Trauma EXAM: CT HEAD WITHOUT CONTRAST CT CERVICAL SPINE WITHOUT CONTRAST CT THORACIC SPINE WITHOUT CONTRAST CT LUMBAR SPINE WITHOUT CONTRAST TECHNIQUE: Multidetector CT imaging of the head and cervical, thoracic, and lumbar spine was performed following the standard protocol without intravenous contrast. Multiplanar CT image reconstructions of the cervical spine were also generated. RADIATION DOSE REDUCTION: This exam was performed according to the departmental dose-optimization program which includes automated exposure control, adjustment of the mA and/or kV according to patient size and/or use of iterative reconstruction technique. COMPARISON:  CT Head 05/01/19 FINDINGS: CT HEAD FINDINGS Brain: No evidence of acute infarction, hydrocephalus, or mass lesion/mass effect. There is a subtle hyperdensity along the right tentorial leaflet (series 5, image 49), which is new compared to 05/01/2019, and favored to represent a small amount of extra-axial blood products. There is advanced generalized volume loss. Vascular: No hyperdense vessel or unexpected calcification. Skull: Soft tissue swelling along the right frontal scalp in the left parietal scalp. No evidence of underlying calvarial fracture. Sinuses/Orbits: Bilateral lens replacement. No evidence of mastoid or middle ear effusion. Paranasal sinuses are clear. Other: None CT CERVICAL, THORACIC, LUMBAR SPINE FINDINGS Alignment: Grade 1 anterolisthesis of C5 on C6.  Otherwise normal. Skull base and vertebrae: No evidence SP cervical spine fracture.  Multilevel degenerative changes. With likely mild-to-moderate spinal canal stenosis at C3-C4. There has been posterior decompression at the T5-T8 level. No evidence of an acute thoracic spine fracture. There may be an acute fracture at the right L4 transverse process (series 2, image 54).  There is an acute fracture through the S4 vertebral body. Soft tissues and spinal canal: See separately dictated CT chest abdomen and pelvis for soft tissue findings in the chest abdomen and pelvis. Disc levels: Limitations: Assessment of the spinal canal limited due to CT technique. Within this limitation Cervical spine: Likely mild-to-moderate spinal canal stenosis at C3-C4. Thoracic spine: No evidence of high-grade spinal canal stenosis. Lumbar spine: Congenitally short pedicles. Severe spinal canal stenosis from the L1-L2 level to the L4-L5 levels. There is also severe bilateral neural foraminal stenosis from the L3-L4 to the L5-S1 levels. Upper chest: See separately dictated CT chest abdomen and pelvis. IMPRESSION: CT HEAD: 1. Subtle hyperdensity along the right tentorial leaflet, favored to represent a small amount of extra-axial blood products. Recommend follow up CT to ensure stability. 2. Right frontal and left parietal scalp soft tissue swelling. No evidence of underlying calvarial fracture. CT CERVICAL SPINE: No acute cervical spine fracture. CT THORACIC SPINE: No acute thoracic spine fracture. CT LUMBAR SPINE: 1. Acute fracture through the S4 vertebral body. 2. Possible acute fracture of the right L4 transverse process. 3. Severe spinal canal stenosis from the L1-L2 level to the L4-L5 levels. Severe bilateral neural foraminal stenosis from the L3-L4 to the L5-S1 levels. Electronically Signed   By: Marin Roberts M.D.   On: 07/31/2022 18:08   CT L-SPINE NO CHARGE  Result Date: 07/31/2022 CLINICAL DATA:  Trauma EXAM: CT HEAD WITHOUT CONTRAST CT CERVICAL SPINE WITHOUT CONTRAST CT THORACIC SPINE WITHOUT CONTRAST CT LUMBAR SPINE WITHOUT CONTRAST TECHNIQUE: Multidetector CT imaging of the head and cervical, thoracic, and lumbar spine was performed following the standard protocol without intravenous contrast. Multiplanar CT image reconstructions of the cervical spine were also generated. RADIATION DOSE  REDUCTION: This exam was performed according to the departmental dose-optimization program which includes automated exposure control, adjustment of the mA and/or kV according to patient size and/or use of iterative reconstruction technique. COMPARISON:  CT Head 05/01/19 FINDINGS: CT HEAD FINDINGS Brain: No evidence of acute infarction, hydrocephalus, or mass lesion/mass effect. There is a subtle hyperdensity along the right tentorial leaflet (series 5, image 49), which is new compared to 05/01/2019, and favored to represent a small amount of extra-axial blood products. There is advanced generalized volume loss. Vascular: No hyperdense vessel or unexpected calcification. Skull: Soft tissue swelling along the right frontal scalp in the left parietal scalp. No evidence of underlying calvarial fracture. Sinuses/Orbits: Bilateral lens replacement. No evidence of mastoid or middle ear effusion. Paranasal sinuses are clear. Other: None CT CERVICAL, THORACIC, LUMBAR SPINE FINDINGS Alignment: Grade 1 anterolisthesis of C5 on C6.  Otherwise normal. Skull base and vertebrae: No evidence SP cervical spine fracture. Multilevel degenerative changes. With likely mild-to-moderate spinal canal stenosis at C3-C4. There has been posterior decompression at the T5-T8 level. No evidence of an acute thoracic spine fracture. There may be an acute fracture at the right L4 transverse process (series 2, image 54). There is an acute fracture through the S4 vertebral body. Soft tissues and spinal canal: See separately dictated CT chest abdomen and pelvis for soft tissue findings in the chest abdomen and pelvis. Disc levels: Limitations: Assessment of the spinal canal limited due to CT technique. Within this limitation Cervical spine:  Likely mild-to-moderate spinal canal stenosis at C3-C4. Thoracic spine: No evidence of high-grade spinal canal stenosis. Lumbar spine: Congenitally short pedicles. Severe spinal canal stenosis from the L1-L2 level  to the L4-L5 levels. There is also severe bilateral neural foraminal stenosis from the L3-L4 to the L5-S1 levels. Upper chest: See separately dictated CT chest abdomen and pelvis. IMPRESSION: CT HEAD: 1. Subtle hyperdensity along the right tentorial leaflet, favored to represent a small amount of extra-axial blood products. Recommend follow up CT to ensure stability. 2. Right frontal and left parietal scalp soft tissue swelling. No evidence of underlying calvarial fracture. CT CERVICAL SPINE: No acute cervical spine fracture. CT THORACIC SPINE: No acute thoracic spine fracture. CT LUMBAR SPINE: 1. Acute fracture through the S4 vertebral body. 2. Possible acute fracture of the right L4 transverse process. 3. Severe spinal canal stenosis from the L1-L2 level to the L4-L5 levels. Severe bilateral neural foraminal stenosis from the L3-L4 to the L5-S1 levels. Electronically Signed   By: Marin Roberts M.D.   On: 07/31/2022 18:08   DG Knee 2 Views Right  Result Date: 07/31/2022 CLINICAL DATA:  Swelling, fall. EXAM: RIGHT KNEE - 1-2 VIEW COMPARISON:  Right knee x-ray 07/27/2022 FINDINGS: Small joint effusion persists. There is prepatellar soft tissue swelling similar to the prior study. There is no acute fracture or dislocation. There is mediolateral compartment degenerative change and chondrocalcinosis similar to prior. Peripheral vascular calcifications are present. IMPRESSION: 1. No acute fracture or dislocation. 2. Small joint effusion and prepatellar soft tissue swelling similar to prior study. Electronically Signed   By: Ronney Asters M.D.   On: 07/31/2022 17:15   DG Chest Port 1 View  Result Date: 07/31/2022 CLINICAL DATA:  Trauma, fall. EXAM: PORTABLE CHEST 1 VIEW COMPARISON:  Chest x-ray 07/27/2022 FINDINGS: Heart is mildly enlarged, unchanged. The lungs are clear. There is no pleural effusion or pneumothorax. No acute fractures are seen. IMPRESSION: No acute cardiopulmonary process. Electronically Signed    By: Ronney Asters M.D.   On: 07/31/2022 17:14   DG Pelvis Portable  Result Date: 07/31/2022 CLINICAL DATA:  Trauma EXAM: PORTABLE PELVIS 1-2 VIEWS COMPARISON:  Pelvis x-ray 07/27/2022 FINDINGS: There is no evidence of pelvic fracture or diastasis. No pelvic bone lesions are seen. There are moderate degenerative changes of both hips similar to the prior study. IMPRESSION: No acute bony abnormality. Moderate degenerative changes of both hips. Electronically Signed   By: Ronney Asters M.D.   On: 07/31/2022 17:12   DG Clavicle Left  Result Date: 07/27/2022 CLINICAL DATA:  Evaluate for possible clavicle fracture. EXAM: LEFT CLAVICLE - 2+ VIEWS COMPARISON:  CT head and cervical spine earlier same day FINDINGS: No evidence for clavicle fracture. AC joint and glenohumeral joint degenerative changes. Visualized left hemithorax is unremarkable. IMPRESSION: No evidence for clavicle fracture. Degenerative changes. Electronically Signed   By: Lovey Newcomer M.D.   On: 07/27/2022 17:49   DG Shoulder Right Port  Result Date: 07/27/2022 CLINICAL DATA:  Trauma EXAM: RIGHT SHOULDER - 1 VIEW COMPARISON:  None Available. FINDINGS: There is no evidence of fracture or dislocation. There is moderate acromioclavicular and glenohumeral joint space narrowing and osteophyte formation compatible with degenerative change. Soft tissues are unremarkable. IMPRESSION: 1. No evidence of fracture or dislocation. 2. Moderate degenerative changes. Electronically Signed   By: Ronney Asters M.D.   On: 07/27/2022 17:03   DG Knee Right Port  Result Date: 07/27/2022 CLINICAL DATA:  Fall with right knee pain. EXAM: PORTABLE RIGHT KNEE -  1-2 VIEW COMPARISON:  None Available. FINDINGS: Mild tricompartmental osteoarthritic change. Chondrocalcinosis over the lateral compartment. Suggestion of a small joint effusion. No evidence of acute fracture or dislocation. Remainder of the exam is unremarkable. IMPRESSION: 1. No acute fracture. 2. Mild  osteoarthritic change. Small joint effusion. Electronically Signed   By: Marin Olp M.D.   On: 07/27/2022 16:25   CT HEAD WO CONTRAST  Result Date: 07/27/2022 CLINICAL DATA:  Trauma. EXAM: CT HEAD WITHOUT CONTRAST CT CERVICAL SPINE WITHOUT CONTRAST TECHNIQUE: Multidetector CT imaging of the head and cervical spine was performed following the standard protocol without intravenous contrast. Multiplanar CT image reconstructions of the cervical spine were also generated. RADIATION DOSE REDUCTION: This exam was performed according to the departmental dose-optimization program which includes automated exposure control, adjustment of the mA and/or kV according to patient size and/or use of iterative reconstruction technique. COMPARISON:  Brain CT 05/01/2019 FINDINGS: CT HEAD FINDINGS Brain: Ventricles and sulci are prominent compatible with atrophy. Periventricular and subcortical white matter hypodensities compatible with chronic microvascular ischemic changes. No evidence for acute cortically based infarct, intracranial hemorrhage, mass lesion or mass-effect. Left basal ganglia chronic lacunar infarcts. Vascular: No hyperdense vessel or unexpected calcification. Skull: Normal. Negative for fracture or focal lesion. Sinuses/Orbits: Paranasal sinuses well aerated. Mastoid air cells are unremarkable. Orbits are unremarkable. Other: Soft tissue swelling overlying the right frontal calvarium. CT CERVICAL SPINE FINDINGS Alignment: Normal. Skull base and vertebrae: No acute fracture. No primary bone lesion or focal pathologic process. Soft tissues and spinal canal: No prevertebral fluid or swelling. No visible canal hematoma. Disc levels: Multilevel degenerative disc and facet disease throughout the visualized cervical spine. No evidence for acute fracture. Upper chest: Negative. Other: Can not exclude possible distal left clavicle fracture on scout image. IMPRESSION: 1. No acute intracranial abnormality. Atrophy and  chronic microvascular ischemic changes. 2. Soft tissue injury overlying the right frontal calvarium. 3. No acute cervical spine fracture. Multilevel degenerative disc disease. 4. Can not exclude possible distal left clavicle fracture on scout image. Recommend correlation with either chest radiograph or left clavicle radiograph. Electronically Signed   By: Lovey Newcomer M.D.   On: 07/27/2022 16:13   CT CERVICAL SPINE WO CONTRAST  Result Date: 07/27/2022 CLINICAL DATA:  Trauma. EXAM: CT HEAD WITHOUT CONTRAST CT CERVICAL SPINE WITHOUT CONTRAST TECHNIQUE: Multidetector CT imaging of the head and cervical spine was performed following the standard protocol without intravenous contrast. Multiplanar CT image reconstructions of the cervical spine were also generated. RADIATION DOSE REDUCTION: This exam was performed according to the departmental dose-optimization program which includes automated exposure control, adjustment of the mA and/or kV according to patient size and/or use of iterative reconstruction technique. COMPARISON:  Brain CT 05/01/2019 FINDINGS: CT HEAD FINDINGS Brain: Ventricles and sulci are prominent compatible with atrophy. Periventricular and subcortical white matter hypodensities compatible with chronic microvascular ischemic changes. No evidence for acute cortically based infarct, intracranial hemorrhage, mass lesion or mass-effect. Left basal ganglia chronic lacunar infarcts. Vascular: No hyperdense vessel or unexpected calcification. Skull: Normal. Negative for fracture or focal lesion. Sinuses/Orbits: Paranasal sinuses well aerated. Mastoid air cells are unremarkable. Orbits are unremarkable. Other: Soft tissue swelling overlying the right frontal calvarium. CT CERVICAL SPINE FINDINGS Alignment: Normal. Skull base and vertebrae: No acute fracture. No primary bone lesion or focal pathologic process. Soft tissues and spinal canal: No prevertebral fluid or swelling. No visible canal hematoma. Disc  levels: Multilevel degenerative disc and facet disease throughout the visualized cervical spine. No evidence for acute  fracture. Upper chest: Negative. Other: Can not exclude possible distal left clavicle fracture on scout image. IMPRESSION: 1. No acute intracranial abnormality. Atrophy and chronic microvascular ischemic changes. 2. Soft tissue injury overlying the right frontal calvarium. 3. No acute cervical spine fracture. Multilevel degenerative disc disease. 4. Can not exclude possible distal left clavicle fracture on scout image. Recommend correlation with either chest radiograph or left clavicle radiograph. Electronically Signed   By: Lovey Newcomer M.D.   On: 07/27/2022 16:13   DG Pelvis Portable  Result Date: 07/27/2022 CLINICAL DATA:  Trauma EXAM: PORTABLE PELVIS 1-2 VIEWS COMPARISON:  None Available. FINDINGS: There is no evidence of displaced pelvic fracture or diastasis. No pelvic bone lesions are seen. Nonobstructive pattern of overlying bowel gas. IMPRESSION: No displaced pelvic fracture or diastasis. Please note that plain radiographs are significantly insensitive for hip and pelvic fracture. Consider CT to more sensitively evaluate if there is high clinical suspicion for fracture. Electronically Signed   By: Delanna Ahmadi M.D.   On: 07/27/2022 15:31   DG Chest Port 1 View  Result Date: 07/27/2022 CLINICAL DATA:  Trauma, fall EXAM: PORTABLE CHEST 1 VIEW COMPARISON:  12/12/2021 FINDINGS: Cardiomegaly. Both lungs are clear. The visualized skeletal structures are unremarkable. IMPRESSION: Cardiomegaly without acute abnormality of the lungs in AP portable projection. Electronically Signed   By: Delanna Ahmadi M.D.   On: 07/27/2022 15:26    Assessment/Plan: 1. Effusion of right knee - 01/21 mechanical fall> increased pain/swelling/warmth to right knee after - completed doxycycline  - increased effusion to MCL, non tender, no warmth, afebrile - recommend ortho consult to discuss aspiration>  refused by wife - comfort measures per wife - cont oxycodone prn for pain      Family/ staff Communication: plan discussed with patient, wife and nurse  Labs/tests ordered: none

## 2022-08-20 DIAGNOSIS — R41841 Cognitive communication deficit: Secondary | ICD-10-CM | POA: Diagnosis not present

## 2022-08-20 DIAGNOSIS — G468 Other vascular syndromes of brain in cerebrovascular diseases: Secondary | ICD-10-CM | POA: Diagnosis not present

## 2022-08-20 DIAGNOSIS — F01A0 Vascular dementia, mild, without behavioral disturbance, psychotic disturbance, mood disturbance, and anxiety: Secondary | ICD-10-CM | POA: Diagnosis not present

## 2022-08-20 DIAGNOSIS — S066X0S Traumatic subarachnoid hemorrhage without loss of consciousness, sequela: Secondary | ICD-10-CM | POA: Diagnosis not present

## 2022-08-20 DIAGNOSIS — M6281 Muscle weakness (generalized): Secondary | ICD-10-CM | POA: Diagnosis not present

## 2022-08-20 DIAGNOSIS — M6389 Disorders of muscle in diseases classified elsewhere, multiple sites: Secondary | ICD-10-CM | POA: Diagnosis not present

## 2022-08-21 DIAGNOSIS — F01A0 Vascular dementia, mild, without behavioral disturbance, psychotic disturbance, mood disturbance, and anxiety: Secondary | ICD-10-CM | POA: Diagnosis not present

## 2022-08-21 DIAGNOSIS — M6389 Disorders of muscle in diseases classified elsewhere, multiple sites: Secondary | ICD-10-CM | POA: Diagnosis not present

## 2022-08-21 DIAGNOSIS — M6281 Muscle weakness (generalized): Secondary | ICD-10-CM | POA: Diagnosis not present

## 2022-08-21 DIAGNOSIS — S066X0S Traumatic subarachnoid hemorrhage without loss of consciousness, sequela: Secondary | ICD-10-CM | POA: Diagnosis not present

## 2022-08-21 DIAGNOSIS — G468 Other vascular syndromes of brain in cerebrovascular diseases: Secondary | ICD-10-CM | POA: Diagnosis not present

## 2022-08-21 DIAGNOSIS — R41841 Cognitive communication deficit: Secondary | ICD-10-CM | POA: Diagnosis not present

## 2022-08-22 DIAGNOSIS — F01A0 Vascular dementia, mild, without behavioral disturbance, psychotic disturbance, mood disturbance, and anxiety: Secondary | ICD-10-CM | POA: Diagnosis not present

## 2022-08-22 DIAGNOSIS — S066X0S Traumatic subarachnoid hemorrhage without loss of consciousness, sequela: Secondary | ICD-10-CM | POA: Diagnosis not present

## 2022-08-22 DIAGNOSIS — G468 Other vascular syndromes of brain in cerebrovascular diseases: Secondary | ICD-10-CM | POA: Diagnosis not present

## 2022-08-22 DIAGNOSIS — M6281 Muscle weakness (generalized): Secondary | ICD-10-CM | POA: Diagnosis not present

## 2022-08-22 DIAGNOSIS — R41841 Cognitive communication deficit: Secondary | ICD-10-CM | POA: Diagnosis not present

## 2022-08-22 DIAGNOSIS — M6389 Disorders of muscle in diseases classified elsewhere, multiple sites: Secondary | ICD-10-CM | POA: Diagnosis not present

## 2022-08-25 DIAGNOSIS — G468 Other vascular syndromes of brain in cerebrovascular diseases: Secondary | ICD-10-CM | POA: Diagnosis not present

## 2022-08-25 DIAGNOSIS — R41841 Cognitive communication deficit: Secondary | ICD-10-CM | POA: Diagnosis not present

## 2022-08-25 DIAGNOSIS — M6389 Disorders of muscle in diseases classified elsewhere, multiple sites: Secondary | ICD-10-CM | POA: Diagnosis not present

## 2022-08-25 DIAGNOSIS — M6281 Muscle weakness (generalized): Secondary | ICD-10-CM | POA: Diagnosis not present

## 2022-08-25 DIAGNOSIS — S066X0S Traumatic subarachnoid hemorrhage without loss of consciousness, sequela: Secondary | ICD-10-CM | POA: Diagnosis not present

## 2022-08-25 DIAGNOSIS — F01A0 Vascular dementia, mild, without behavioral disturbance, psychotic disturbance, mood disturbance, and anxiety: Secondary | ICD-10-CM | POA: Diagnosis not present

## 2022-08-26 ENCOUNTER — Non-Acute Institutional Stay (SKILLED_NURSING_FACILITY): Payer: Medicare Other | Admitting: Orthopedic Surgery

## 2022-08-26 ENCOUNTER — Encounter: Payer: Self-pay | Admitting: Orthopedic Surgery

## 2022-08-26 DIAGNOSIS — S066X0S Traumatic subarachnoid hemorrhage without loss of consciousness, sequela: Secondary | ICD-10-CM | POA: Diagnosis not present

## 2022-08-26 DIAGNOSIS — I5032 Chronic diastolic (congestive) heart failure: Secondary | ICD-10-CM

## 2022-08-26 DIAGNOSIS — R0902 Hypoxemia: Secondary | ICD-10-CM

## 2022-08-26 DIAGNOSIS — R635 Abnormal weight gain: Secondary | ICD-10-CM | POA: Diagnosis not present

## 2022-08-26 DIAGNOSIS — G468 Other vascular syndromes of brain in cerebrovascular diseases: Secondary | ICD-10-CM | POA: Diagnosis not present

## 2022-08-26 DIAGNOSIS — R41841 Cognitive communication deficit: Secondary | ICD-10-CM | POA: Diagnosis not present

## 2022-08-26 DIAGNOSIS — M6281 Muscle weakness (generalized): Secondary | ICD-10-CM | POA: Diagnosis not present

## 2022-08-26 DIAGNOSIS — M6389 Disorders of muscle in diseases classified elsewhere, multiple sites: Secondary | ICD-10-CM | POA: Diagnosis not present

## 2022-08-26 DIAGNOSIS — F01A0 Vascular dementia, mild, without behavioral disturbance, psychotic disturbance, mood disturbance, and anxiety: Secondary | ICD-10-CM | POA: Diagnosis not present

## 2022-08-26 NOTE — Progress Notes (Signed)
Location:   [facility] Room Number: 142-A Place of Service:  SNF [id number]) Provider:  Windell Moulding, NP  PCP: [name], MD  [name], [name] [name], [name]  Extended Emergency Contact Information Primary Emergency Contact: Venn,[name] Address: [address]          [place], [place] [address] [name] of [place] Phone: [phone] Mobile Phone: [phone] Relation: Spouse Secondary Emergency Contact: [name],[name] Address: [address]          [place], [place] [address] [place] Mobile Phone: [phone] Relation: Daughter  Code Status:  DNR Goals of care: Advanced Directive information    08/26/2022    9:45 AM  Advanced Directives  Does Patient Have a Medical Advance Directive? Yes  Type of Advance Directive Living will;Out of facility DNR (pink MOST or yellow form)  Does patient want to make changes to medical advance directive? No - Patient declined     Chief Complaint  Patient presents with   Acute Visit    Hypoxia     HPI:  Pt is a 89 y.o. male seen today for an acute visit for due to hypoxia.   Last night, nursing reports O2 sat 85% on RA and diminished lung sounds. He was placed on 3 liters oxygen and sats improved > 90%. Today, afebrile. Vitals stable. He is a poor historian due to dementia. Pulse ox was not working, dudpect due to cold hands. Warm compress placed on his hand and a new pulose oximeter was used. His sats were 99% with 3 liters oxygen. Oxygen was removed, sats 99% on RA. Breathing unlabored.   H/o CHF. His weight has fluctuated within the past week. 02/16 weight increased 4.2 lbs, he was given furosemide prn.    Past Medical History:  Diagnosis Date   Adult failure to thrive    Per incoming records from [facility]   Allergic rhinitis    Per  incoming records from [facility]   Atrial fibrillation [facility])    BPH (benign prostatic hyperplasia)    Per incoming records from [place] of kidney [facility])    Right, Per incoming records from [place]    Per incoming records from [facility]   Cerebrovascular accident, late effects    Per incoming records from [facility] kidney disease, stage III (moderate) ([place])    Per incoming records from [facility])    Per incoming records from [facility]   Cognitive impairment    Mild, Per incoming records from [facility]   Constipation    Per incoming records from [facility]   Dementia [facility]    Diarrhea    Better off Aricept, Per incoming records from [facility]   Diastolic dysfunction    on 2D Echo 07/2009 and 2016, Per incoming records from [facility]   Diverticulosis    Mild, Per incoming records from [facility]   Diverticulosis of colon (without mention of hemorrhage)    ED (erectile dysfunction)    Per incoming records from [facility]   History of Coumadin therapy    Per incoming records from [facility]   History of echocardiogram 09/2014   Per incoming records from  [facility]   Hyperlipemia    Hypertension    Kidney mass    Per incoming records from [place] leg edema    Per incoming records from [facility])    Neuropathy    Per incoming records from [facility]   OA (osteoarthritis)    Per incoming records from [facility]   Peripheral neuropathy    Per incoming records from [place] history of colonic [place] & 2004   adenomatous polyps   Pulmonary  arterial hypertension ([place])    Per incoming records from [place]    Per incoming records from [facility]   Thrombocytopenia [facility]) 2017   Per incoming records from [facility]   UTI (urinary tract infection)    Sepsis, Per incoming records from [place] as ambulation aid    Per incoming records from [facility]   Past Surgical History:  Procedure Laterality Date   APPENDECTOMY     Per incoming records from [facility]     Per incoming records from [place]  11/21/2019   KNEE SURGERY     right   PILONIDAL CYST DRAINAGE     POLYPECTOMY     Per incoming records from [place] tumor lumbar spine     SPINE SURGERY     tumor removed   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     Allergies as of 08/26/2022       Reactions   Penicillins Rash   Has patient had a PCN reaction causing immediate rash, facial/tongue/throat swelling, SOB or lightheadedness with hypotension: unknown Has patient had a PCN reaction causing severe rash involving mucus membranes or skin necrosis: unknown Has patient had a PCN reaction that  required hospitalization : unknown Has patient had a PCN reaction occurring within the last 10 years: no If all of the above answers are "NO", then may proceed with Cephalosporin use.        Medication List        Accurate as of August 26, 2022  9:46 AM. If you have any questions, ask your nurse or doctor.          acetaminophen 500 MG tablet Commonly known as: TYLENOL Take 1,000 mg by mouth 2 (two) times daily.   cholecalciferol 25 MCG (1000 UNIT) tablet Commonly known as: VITAMIN D3 Take 2,000 Units by mouth daily.   Cranberry 400 MG Caps Take 400 mg by mouth every morning. 400 mg, oral, Once a morning   DULoxetine 20 MG capsule Commonly known as: CYMBALTA Take 20 mg by mouth daily.   furosemide 20  MG tablet Commonly known as: LASIX Take 20 mg by mouth as needed.   furosemide 20 MG tablet Commonly known as: LASIX Take 2 tablets (40 mg total) by mouth daily. Take extra 68m for weight gain of 2-3lbs in 1 day or 5 lbs in 1 week   gabapentin 100 MG capsule Commonly known as: NEURONTIN Take 200 mg by mouth daily. In the morning 8 am-11 am, see other listing for bedtime dosing   gabapentin 100 MG capsule Commonly known as: NEURONTIN Take 200 mg by mouth at bedtime.   ketoconazole 2 % shampoo Commonly known as: NIZORAL Apply 1 Application topically 2 (two) times a week.   oxyCODONE 5 MG immediate release tablet Commonly known as: Oxy IR/ROXICODONE Take 5 mg by mouth in the morning. For pain before transfer What changed: Another medication with the same name was removed. Continue taking this medication, and follow the directions you see here. Changed by: AYvonna Alanis NP   polyethylene glycol 17 g packet Commonly known as: MIRALAX / GLYCOLAX Take 17 g by mouth daily.   potassium chloride 10 MEQ CR capsule Commonly known as: MICRO-K Take 10 mEq by mouth every Tuesday, Thursday, and Saturday at 6 PM. Every Tues., Thurs., Sat. 7:00 am - 3:00 pm, per [facility]   triamcinolone lotion 0.1 % Commonly known as: KENALOG Apply 1 application. topically 2 (two) times daily as needed (rash on back).        Review of Systems  Unable to perform ROS: Dementia    Immunization History  Administered Date(s) Administered   DTaP 01/20/2022   Influenza, High Dose Seasonal PF 05/04/2020, 04/10/2021   Influenza-Unspecified 04/17/2014, 05/01/2016, 04/15/2019, 04/11/2022   Moderna Covid-19 Vaccine Bivalent Booster 184yr& up 04/17/2021   Moderna SARS-COV2 Booster Vaccination 05/17/2020, 05/12/2022   Moderna Sars-Covid-2 Vaccination 07/12/2019, 08/09/2019, 11/03/2021   Pneumococcal Conjugate-13 04/20/2014   Pneumococcal Polysaccharide-23 07/08/2003, 03/31/2011   Pneumococcal-Unspecified 01/26/2004   Td 08/23/2007   Tdap 01/07/2012, 01/20/2022   Zoster Recombinat (Shingrix) 10/05/2017, 12/09/2017   Zoster, Live 07/17/2005   Pertinent  Health Maintenance Due  Topic Date Due   INFLUENZA VACCINE  Completed      12/14/2021    8:00 AM 12/15/2021    9:00 AM 12/16/2021   10:00 AM 05/27/2022    1:32 PM 07/30/2022    2:31 PM  Fall Risk  Falls in the past year?    0 0  Was there an injury with Fall?    0 0  Fall Risk Category Calculator    0 0  Fall Risk Category (Retired)    Low   (RETIRED) Patient Fall Risk Level High fall risk High fall risk High fall risk Low fall risk   Patient at Risk for Falls Due to     History of fall(s)  Fall risk Follow up     Falls evaluation completed   Functional Status Survey:    Vitals:   08/26/22 0940  BP: (!) 125/57  Pulse: 63  Resp: 16  Temp: (!) 97 F (36.1 C)  SpO2: 97%  Weight: 260 lb 6.4 oz (118.1 kg)  Height: 6' 7"$  (2.007 m)   Body mass index is 29.34 kg/m. Physical Exam Vitals reviewed.  Constitutional:      General: He is not in acute distress. HENT:     Head: Normocephalic.  Eyes:     General:        Right eye: No discharge.  Left eye: No discharge.  Cardiovascular:      Rate and Rhythm: Normal rate. Rhythm irregular.     Pulses: Normal pulses.     Heart sounds: Murmur heard.  Pulmonary:     Effort: Pulmonary effort is normal. No respiratory distress.     Breath sounds: Normal breath sounds. No wheezing or rales.  Abdominal:     General: Bowel sounds are normal.  Musculoskeletal:     Cervical back: Neck supple.     Right lower leg: Edema present.     Left lower leg: Edema present.     Comments: 2+ pitting  Skin:    General: Skin is warm and dry.     Capillary Refill: Capillary refill takes less than 2 seconds.  Neurological:     General: No focal deficit present.     Mental Status: He is alert. Mental status is at baseline.     Motor: Weakness present.     Gait: Gait abnormal.     Comments: wheelchair  Psychiatric:        Mood and Affect: Mood normal.        Behavior: Behavior normal.     Labs reviewed: Recent Labs    07/27/22 1503 07/31/22 1655 08/01/22 0300  NA 140 137  140 138  K 4.5 4.9  5.0 4.2  CL 107 104  106 106  CO2 26 24 22  $ GLUCOSE 107* 172*  165* 143*  BUN 26* 30*  33* 27*  CREATININE 1.54* 1.57*  1.60* 1.33*  CALCIUM 10.4* 10.2 9.8   Recent Labs    07/27/22 1503 07/31/22 1655 08/01/22 0300  AST 42* 25 21  ALT 8 11 11  $ ALKPHOS 100 96 80  BILITOT 0.9 1.3* 1.1  PROT 6.7 6.7 6.0*  ALBUMIN 3.8 3.9 3.3*   Recent Labs    12/11/21 2010 12/12/21 0650 05/05/22 0000 05/23/22 1657 06/09/22 0815 07/27/22 1503 07/31/22 1655 08/01/22 0300  WBC 5.1   < > 2.1 2.3   < > 2.5* 3.1* 3.9*  NEUTROABS 4.1  --  1.00 1.30  --   --   --   --   HGB 13.2   < > 11.9* 10.5*   < > 11.3* 10.6*  11.2* 9.2*  HCT 43.1   < > 37* 33*   < > 38.9* 34.9*  33.0* 30.0*  MCV 90.5   < >  --   --   --  94.2 91.8 90.4  PLT 88*   < > 77* 18*   < > 74* 73* 67*   < > = values in this interval not displayed.   Lab Results  Component Value Date   TSH 0.84 05/19/2019   Lab Results  Component Value Date   HGBA1C 6 08/22/2019   Lab  Results  Component Value Date   CHOL 129 08/09/2020   HDL 32 (A) 08/09/2020   LDLCALC 80 08/09/2020   TRIG 86 08/09/2020    Significant Diagnostic Results in last 30 days:  CT HEAD WO CONTRAST (5MM)  Result Date: 08/01/2022 CLINICAL DATA:  Known hemorrhage EXAM: CT HEAD WITHOUT CONTRAST TECHNIQUE: Contiguous axial images were obtained from the base of the skull through the vertex without intravenous contrast. RADIATION DOSE REDUCTION: This exam was performed according to the departmental dose-optimization program which includes automated exposure control, adjustment of the mA and/or kV according to patient size and/or use of iterative reconstruction technique. COMPARISON:  CT head 1 day prior FINDINGS: Brain: Small  volume subarachnoid hemorrhage along the underside of the right temporal lobe is unchanged (5-43). There is no edema or mass effect. There is no other acute intracranial hemorrhage or extra-axial fluid collection. There is no evidence of acute infarct. Parenchymal volume is stable. The ventricles are stable in size. Gray-white differentiation is preserved. Pituitary and suprasellar region are normal. There is no mass lesion or midline shift. Vascular: There is calcification of the bilateral carotid siphons and vertebral arteries. Skull: Normal. Negative for fracture or focal lesion. Sinuses/Orbits: The paranasal sinuses are clear. Bilateral lens implants are in place. The globes and orbits are otherwise unremarkable. Other: Right frontal and left parietal scalp swelling is again seen. IMPRESSION: Stable small volume subarachnoid hemorrhage overlying the right temporal lobe. No new acute intracranial pathology. Electronically Signed   By: Valetta Mole M.D.   On: 08/01/2022 08:07   CT CHEST ABDOMEN PELVIS W CONTRAST  Result Date: 07/31/2022 CLINICAL DATA:  Polytrauma, blunt [id number] Trauma [id number] EXAM: CT CHEST, ABDOMEN, AND PELVIS WITH CONTRAST TECHNIQUE: Multidetector CT imaging of the chest,  abdomen and pelvis was performed following the standard protocol during bolus administration of intravenous contrast. RADIATION DOSE REDUCTION: This exam was performed according to the departmental dose-optimization program which includes automated exposure control, adjustment of the mA and/or kV according to patient size and/or use of iterative reconstruction technique. CONTRAST:  59m OMNIPAQUE IOHEXOL 350 MG/ML SOLN COMPARISON:  CT abdomen pelvis 06/10/2018, CT abdomen pelvis 06/17/2019 FINDINGS: CHEST: Cardiovascular: No aortic injury. The ascending thoracic aorta is enlarged in caliber measuring up to 4.3 cm. The descending thoracic aorta is normal in caliber. Severe atherosclerotic plaque. Aortic valve leaflet calcification. At least 2 vessel coronary artery calcification. The heart is mildly enlarged in size. No significant pericardial effusion. The main pulmonary artery is enlarged in caliber measuring up to 3.5 cm. No main pulmonary embolus. Mediastinum/Nodes: No pneumomediastinum. No mediastinal hematoma. The esophagus is unremarkable. The thyroid is unremarkable. The central airways are patent. No mediastinal, hilar, or axillary lymphadenopathy. Lungs/Pleura: Diffuse bronchial wall thickening. No focal consolidation. Left apical 7 mm subsolid pulmonary nodule (5:5). Slightly more inferior 5 mm solid left upper lobe pulmonary nodule (5:53). Right middle lobe 5 mm ground-glass airspace opacity (5:101). No pulmonary mass. No pulmonary contusion or laceration. No pneumatocele formation. Low-density right pleural fluid which may represent a trace pleural effusion versus chylothorax. No pneumothorax. No hemothorax. Musculoskeletal/Chest wall: No chest wall mass. No acute rib or sternal fracture. Please see separately dictated CT thoracolumbar spine 07/31/2022. ABDOMEN / PELVIS: Hepatobiliary: Not enlarged. No focal lesion. No laceration or subcapsular hematoma. The gallbladder is otherwise unremarkable with no  radio-opaque gallstones. No biliary ductal dilatation. Pancreas: Normal pancreatic contour. No main pancreatic duct dilatation. Spleen: Not enlarged. No focal lesion. No laceration, subcapsular hematoma, or vascular injury. Adrenals/Urinary Tract: No nodularity bilaterally. Nonspecific bilateral perinephric fat stranding. Bilateral kidneys enhance symmetrically. Interval increase in size of a right superior renal pole mass measuring at least 5 cm (from 3.7 cm) versus interval development of an adjacent new mass. Punctate left nephrolithiasis. No hydronephrosis. No contusion, laceration, or subcapsular hematoma. No injury to the vascular structures or collecting systems. No hydroureter. Suprapubic catheter terminates within in the collapsed urinary bladder lumen. Several calcified stones are noted within the gallbladder lumen measuring up to 8 mm. Stomach/Bowel: No small or large bowel wall thickening or dilatation. The appendix is not definitely identified with no inflammatory changes in the right lower quadrant to suggest acute appendicitis. Vasculature/Lymphatics: Severe atherosclerotic plaque. No  abdominal aorta or iliac aneurysm. No active contrast extravasation or pseudoaneurysm. No abdominal, pelvic, inguinal lymphadenopathy. Reproductive: Prostate enlarged in size measuring up to 6.2 cm. Other: No simple free fluid ascites. No pneumoperitoneum. No hemoperitoneum. No mesenteric hematoma identified. No organized fluid collection. Musculoskeletal: No significant soft tissue hematoma. Age-indeterminate, possibly acute, displaced right S4 sacral ala fracture with extension to the right S4 and S5 sacral foramina as well as extension to the left S3 sacral ala and left S3 sacral foramina (7:136, 8: 46). Please see separately dictated CT thoracolumbar spine 07/31/2022. No extension to the sacroiliac joints. No pelvic diastasis. No other acute displaced pelvic fracture. Ports and Devices: None. IMPRESSION: 1.  Age-indeterminate, possibly acute, displaced bilateral sacral ala fractures extending to the right S4 and S5 as well as left S3 sacral foramina. 2. No acute intrathoracic, intra-abdominal, intrapelvic traumatic injury. 3. Please see separately dictated CT thoracolumbar spine as well as CT head and C-spine 07/31/2022. Other imaging findings of potential clinical significance: 1. Interval increase in size of a right superior renal pole mass measuring at least 5 cm (from 3.7 cm) versus interval development of an adjacent new mass. Findings suggestive of malignancy. When the patient is clinically stable and able to follow directions and hold their breath (preferably as an outpatient) further evaluation with dedicated MRI renal protocol should be considered. 2. 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 Recommend annual imaging followup by CTA or MRA. This recommendation follows 2010 ACCF/AHA/AATS/ACR/ASA/SCA/SCAI/SIR/STS/SVM Guidelines for the Diagnosis and Management of Patients with Thoracic Aortic Disease. Circulation. 2010; [id number]. Aortic aneurysm NOS (ICD10-I71.9) . 3. Mild cardiomegaly. 4. Enlarged main pulmonary artery suggestive of pulmonary hypertension. 5. Left apical 7 mm subsolid pulmonary nodule. Follow-up non-contrast CT recommended at 3-6 months to confirm persistence. If unchanged, and solid component remains <6 mm, annual CT is recommended until 5 years of stability has been established. If persistent these nodules should be considered highly suspicious if the solid component of the nodule is 6 mm or greater in size and enlarging. This recommendation follows the consensus statement: Guidelines for Management of Incidental Pulmonary Nodules Detected on CT Images: From the Fleischner Society 2017; Radiology 2017; 284:228-243. 6. Low-density right pleural fluid which may represent a trace pleural effusion versus chylothorax. 7. Nonobstructive left nephrolithiasis. 8. Calcified stones within the  urinary bladder lumen. Urinary bladder decompressed with suprapubic catheter in appropriate position. 9. Prostatomegaly. 10. Aortic Atherosclerosis (ICD10-I70.0) including coronary artery calcification and aortic valve leaflet calcifications-correlate for aortic stenosis. Electronically Signed   By: Iven Finn M.D.   On: 07/31/2022 18:45   CT HEAD WO CONTRAST  Result Date: 07/31/2022 CLINICAL DATA:  Trauma EXAM: CT HEAD WITHOUT CONTRAST CT CERVICAL SPINE WITHOUT CONTRAST CT THORACIC SPINE WITHOUT CONTRAST CT LUMBAR SPINE WITHOUT CONTRAST TECHNIQUE: Multidetector CT imaging of the head and cervical, thoracic, and lumbar spine was performed following the standard protocol without intravenous contrast. Multiplanar CT image reconstructions of the cervical spine were also generated. RADIATION DOSE REDUCTION: This exam was performed according to the departmental dose-optimization program which includes automated exposure control, adjustment of the mA and/or kV according to patient size and/or use of iterative reconstruction technique. COMPARISON:  CT Head 05/01/19 FINDINGS: CT HEAD FINDINGS Brain: No evidence of acute infarction, hydrocephalus, or mass lesion/mass effect. There is a subtle hyperdensity along the right tentorial leaflet (series 5, image 49), which is new compared to 05/01/2019, and favored to represent a small amount of extra-axial blood products. There is advanced generalized volume loss. Vascular: No  hyperdense vessel or unexpected calcification. Skull: Soft tissue swelling along the right frontal scalp in the left parietal scalp. No evidence of underlying calvarial fracture. Sinuses/Orbits: Bilateral lens replacement. No evidence of mastoid or middle ear effusion. Paranasal sinuses are clear. Other: None CT CERVICAL, THORACIC, LUMBAR SPINE FINDINGS Alignment: Grade 1 anterolisthesis of C5 on C6.  Otherwise normal. Skull base and vertebrae: No evidence SP cervical spine fracture. Multilevel  degenerative changes. With likely mild-to-moderate spinal canal stenosis at C3-C4. There has been posterior decompression at the T5-T8 level. No evidence of an acute thoracic spine fracture. There may be an acute fracture at the right L4 transverse process (series 2, image 54). There is an acute fracture through the S4 vertebral body. Soft tissues and spinal canal: See separately dictated CT chest abdomen and pelvis for soft tissue findings in the chest abdomen and pelvis. Disc levels: Limitations: Assessment of the spinal canal limited due to CT technique. Within this limitation Cervical spine: Likely mild-to-moderate spinal canal stenosis at C3-C4. Thoracic spine: No evidence of high-grade spinal canal stenosis. Lumbar spine: Congenitally short pedicles. Severe spinal canal stenosis from the L1-L2 level to the L4-L5 levels. There is also severe bilateral neural foraminal stenosis from the L3-L4 to the L5-S1 levels. Upper chest: See separately dictated CT chest abdomen and pelvis. IMPRESSION: CT HEAD: 1. Subtle hyperdensity along the right tentorial leaflet, favored to represent a small amount of extra-axial blood products. Recommend follow up CT to ensure stability. 2. Right frontal and left parietal scalp soft tissue swelling. No evidence of underlying calvarial fracture. CT CERVICAL SPINE: No acute cervical spine fracture. CT THORACIC SPINE: No acute thoracic spine fracture. CT LUMBAR SPINE: 1. Acute fracture through the S4 vertebral body. 2. Possible acute fracture of the right L4 transverse process. 3. Severe spinal canal stenosis from the L1-L2 level to the L4-L5 levels. Severe bilateral neural foraminal stenosis from the L3-L4 to the L5-S1 levels. Electronically Signed   By: Marin Roberts M.D.   On: 07/31/2022 18:08   CT CERVICAL SPINE WO CONTRAST  Result Date: 07/31/2022 CLINICAL DATA:  Trauma EXAM: CT HEAD WITHOUT CONTRAST CT CERVICAL SPINE WITHOUT CONTRAST CT THORACIC SPINE WITHOUT CONTRAST CT LUMBAR  SPINE WITHOUT CONTRAST TECHNIQUE: Multidetector CT imaging of the head and cervical, thoracic, and lumbar spine was performed following the standard protocol without intravenous contrast. Multiplanar CT image reconstructions of the cervical spine were also generated. RADIATION DOSE REDUCTION: This exam was performed according to the departmental dose-optimization program which includes automated exposure control, adjustment of the mA and/or kV according to patient size and/or use of iterative reconstruction technique. COMPARISON:  CT Head 05/01/19 FINDINGS: CT HEAD FINDINGS Brain: No evidence of acute infarction, hydrocephalus, or mass lesion/mass effect. There is a subtle hyperdensity along the right tentorial leaflet (series 5, image 49), which is new compared to 05/01/2019, and favored to represent a small amount of extra-axial blood products. There is advanced generalized volume loss. Vascular: No hyperdense vessel or unexpected calcification. Skull: Soft tissue swelling along the right frontal scalp in the left parietal scalp. No evidence of underlying calvarial fracture. Sinuses/Orbits: Bilateral lens replacement. No evidence of mastoid or middle ear effusion. Paranasal sinuses are clear. Other: None CT CERVICAL, THORACIC, LUMBAR SPINE FINDINGS Alignment: Grade 1 anterolisthesis of C5 on C6.  Otherwise normal. Skull base and vertebrae: No evidence SP cervical spine fracture. Multilevel degenerative changes. With likely mild-to-moderate spinal canal stenosis at C3-C4. There has been posterior decompression at the T5-T8 level. No evidence of an  acute thoracic spine fracture. There may be an acute fracture at the right L4 transverse process (series 2, image 54). There is an acute fracture through the S4 vertebral body. Soft tissues and spinal canal: See separately dictated CT chest abdomen and pelvis for soft tissue findings in the chest abdomen and pelvis. Disc levels: Limitations: Assessment of the spinal canal  limited due to CT technique. Within this limitation Cervical spine: Likely mild-to-moderate spinal canal stenosis at C3-C4. Thoracic spine: No evidence of high-grade spinal canal stenosis. Lumbar spine: Congenitally short pedicles. Severe spinal canal stenosis from the L1-L2 level to the L4-L5 levels. There is also severe bilateral neural foraminal stenosis from the L3-L4 to the L5-S1 levels. Upper chest: See separately dictated CT chest abdomen and pelvis. IMPRESSION: CT HEAD: 1. Subtle hyperdensity along the right tentorial leaflet, favored to represent a small amount of extra-axial blood products. Recommend follow up CT to ensure stability. 2. Right frontal and left parietal scalp soft tissue swelling. No evidence of underlying calvarial fracture. CT CERVICAL SPINE: No acute cervical spine fracture. CT THORACIC SPINE: No acute thoracic spine fracture. CT LUMBAR SPINE: 1. Acute fracture through the S4 vertebral body. 2. Possible acute fracture of the right L4 transverse process. 3. Severe spinal canal stenosis from the L1-L2 level to the L4-L5 levels. Severe bilateral neural foraminal stenosis from the L3-L4 to the L5-S1 levels. Electronically Signed   By: Marin Roberts M.D.   On: 07/31/2022 18:08   CT T-SPINE NO CHARGE  Result Date: 07/31/2022 CLINICAL DATA:  Trauma EXAM: CT HEAD WITHOUT CONTRAST CT CERVICAL SPINE WITHOUT CONTRAST CT THORACIC SPINE WITHOUT CONTRAST CT LUMBAR SPINE WITHOUT CONTRAST TECHNIQUE: Multidetector CT imaging of the head and cervical, thoracic, and lumbar spine was performed following the standard protocol without intravenous contrast. Multiplanar CT image reconstructions of the cervical spine were also generated. RADIATION DOSE REDUCTION: This exam was performed according to the departmental dose-optimization program which includes automated exposure control, adjustment of the mA and/or kV according to patient size and/or use of iterative reconstruction technique. COMPARISON:  CT Head  05/01/19 FINDINGS: CT HEAD FINDINGS Brain: No evidence of acute infarction, hydrocephalus, or mass lesion/mass effect. There is a subtle hyperdensity along the right tentorial leaflet (series 5, image 49), which is new compared to 05/01/2019, and favored to represent a small amount of extra-axial blood products. There is advanced generalized volume loss. Vascular: No hyperdense vessel or unexpected calcification. Skull: Soft tissue swelling along the right frontal scalp in the left parietal scalp. No evidence of underlying calvarial fracture. Sinuses/Orbits: Bilateral lens replacement. No evidence of mastoid or middle ear effusion. Paranasal sinuses are clear. Other: None CT CERVICAL, THORACIC, LUMBAR SPINE FINDINGS Alignment: Grade 1 anterolisthesis of C5 on C6.  Otherwise normal. Skull base and vertebrae: No evidence SP cervical spine fracture. Multilevel degenerative changes. With likely mild-to-moderate spinal canal stenosis at C3-C4. There has been posterior decompression at the T5-T8 level. No evidence of an acute thoracic spine fracture. There may be an acute fracture at the right L4 transverse process (series 2, image 54). There is an acute fracture through the S4 vertebral body. Soft tissues and spinal canal: See separately dictated CT chest abdomen and pelvis for soft tissue findings in the chest abdomen and pelvis. Disc levels: Limitations: Assessment of the spinal canal limited due to CT technique. Within this limitation Cervical spine: Likely mild-to-moderate spinal canal stenosis at C3-C4. Thoracic spine: No evidence of high-grade spinal canal stenosis. Lumbar spine: Congenitally short pedicles. Severe spinal canal  stenosis from the L1-L2 level to the L4-L5 levels. There is also severe bilateral neural foraminal stenosis from the L3-L4 to the L5-S1 levels. Upper chest: See separately dictated CT chest abdomen and pelvis. IMPRESSION: CT HEAD: 1. Subtle hyperdensity along the right tentorial leaflet,  favored to represent a small amount of extra-axial blood products. Recommend follow up CT to ensure stability. 2. Right frontal and left parietal scalp soft tissue swelling. No evidence of underlying calvarial fracture. CT CERVICAL SPINE: No acute cervical spine fracture. CT THORACIC SPINE: No acute thoracic spine fracture. CT LUMBAR SPINE: 1. Acute fracture through the S4 vertebral body. 2. Possible acute fracture of the right L4 transverse process. 3. Severe spinal canal stenosis from the L1-L2 level to the L4-L5 levels. Severe bilateral neural foraminal stenosis from the L3-L4 to the L5-S1 levels. Electronically Signed   By: Marin Roberts M.D.   On: 07/31/2022 18:08   CT L-SPINE NO CHARGE  Result Date: 07/31/2022 CLINICAL DATA:  Trauma EXAM: CT HEAD WITHOUT CONTRAST CT CERVICAL SPINE WITHOUT CONTRAST CT THORACIC SPINE WITHOUT CONTRAST CT LUMBAR SPINE WITHOUT CONTRAST TECHNIQUE: Multidetector CT imaging of the head and cervical, thoracic, and lumbar spine was performed following the standard protocol without intravenous contrast. Multiplanar CT image reconstructions of the cervical spine were also generated. RADIATION DOSE REDUCTION: This exam was performed according to the departmental dose-optimization program which includes automated exposure control, adjustment of the mA and/or kV according to patient size and/or use of iterative reconstruction technique. COMPARISON:  CT Head 05/01/19 FINDINGS: CT HEAD FINDINGS Brain: No evidence of acute infarction, hydrocephalus, or mass lesion/mass effect. There is a subtle hyperdensity along the right tentorial leaflet (series 5, image 49), which is new compared to 05/01/2019, and favored to represent a small amount of extra-axial blood products. There is advanced generalized volume loss. Vascular: No hyperdense vessel or unexpected calcification. Skull: Soft tissue swelling along the right frontal scalp in the left parietal scalp. No evidence of underlying calvarial  fracture. Sinuses/Orbits: Bilateral lens replacement. No evidence of mastoid or middle ear effusion. Paranasal sinuses are clear. Other: None CT CERVICAL, THORACIC, LUMBAR SPINE FINDINGS Alignment: Grade 1 anterolisthesis of C5 on C6.  Otherwise normal. Skull base and vertebrae: No evidence SP cervical spine fracture. Multilevel degenerative changes. With likely mild-to-moderate spinal canal stenosis at C3-C4. There has been posterior decompression at the T5-T8 level. No evidence of an acute thoracic spine fracture. There may be an acute fracture at the right L4 transverse process (series 2, image 54). There is an acute fracture through the S4 vertebral body. Soft tissues and spinal canal: See separately dictated CT chest abdomen and pelvis for soft tissue findings in the chest abdomen and pelvis. Disc levels: Limitations: Assessment of the spinal canal limited due to CT technique. Within this limitation Cervical spine: Likely mild-to-moderate spinal canal stenosis at C3-C4. Thoracic spine: No evidence of high-grade spinal canal stenosis. Lumbar spine: Congenitally short pedicles. Severe spinal canal stenosis from the L1-L2 level to the L4-L5 levels. There is also severe bilateral neural foraminal stenosis from the L3-L4 to the L5-S1 levels. Upper chest: See separately dictated CT chest abdomen and pelvis. IMPRESSION: CT HEAD: 1. Subtle hyperdensity along the right tentorial leaflet, favored to represent a small amount of extra-axial blood products. Recommend follow up CT to ensure stability. 2. Right frontal and left parietal scalp soft tissue swelling. No evidence of underlying calvarial fracture. CT CERVICAL SPINE: No acute cervical spine fracture. CT THORACIC SPINE: No acute thoracic spine fracture. CT  LUMBAR SPINE: 1. Acute fracture through the S4 vertebral body. 2. Possible acute fracture of the right L4 transverse process. 3. Severe spinal canal stenosis from the L1-L2 level to the L4-L5 levels. Severe  bilateral neural foraminal stenosis from the L3-L4 to the L5-S1 levels. Electronically Signed   By: Marin Roberts M.D.   On: 07/31/2022 18:08   DG Knee 2 Views Right  Result Date: 07/31/2022 CLINICAL DATA:  Swelling, fall. EXAM: RIGHT KNEE - 1-2 VIEW COMPARISON:  Right knee x-ray 07/27/2022 FINDINGS: Small joint effusion persists. There is prepatellar soft tissue swelling similar to the prior study. There is no acute fracture or dislocation. There is mediolateral compartment degenerative change and chondrocalcinosis similar to prior. Peripheral vascular calcifications are present. IMPRESSION: 1. No acute fracture or dislocation. 2. Small joint effusion and prepatellar soft tissue swelling similar to prior study. Electronically Signed   By: Ronney Asters M.D.   On: 07/31/2022 17:15   DG Chest Port 1 View  Result Date: 07/31/2022 CLINICAL DATA:  Trauma, fall. EXAM: PORTABLE CHEST 1 VIEW COMPARISON:  Chest x-ray 07/27/2022 FINDINGS: Heart is mildly enlarged, unchanged. The lungs are clear. There is no pleural effusion or pneumothorax. No acute fractures are seen. IMPRESSION: No acute cardiopulmonary process. Electronically Signed   By: Ronney Asters M.D.   On: 07/31/2022 17:14   DG Pelvis Portable  Result Date: 07/31/2022 CLINICAL DATA:  Trauma EXAM: PORTABLE PELVIS 1-2 VIEWS COMPARISON:  Pelvis x-ray 07/27/2022 FINDINGS: There is no evidence of pelvic fracture or diastasis. No pelvic bone lesions are seen. There are moderate degenerative changes of both hips similar to the prior study. IMPRESSION: No acute bony abnormality. Moderate degenerative changes of both hips. Electronically Signed   By: Ronney Asters M.D.   On: 07/31/2022 17:12   DG Clavicle Left  Result Date: 07/27/2022 CLINICAL DATA:  Evaluate for possible clavicle fracture. EXAM: LEFT CLAVICLE - 2+ VIEWS COMPARISON:  CT head and cervical spine earlier same day FINDINGS: No evidence for clavicle fracture. AC joint and glenohumeral joint  degenerative changes. Visualized left hemithorax is unremarkable. IMPRESSION: No evidence for clavicle fracture. Degenerative changes. Electronically Signed   By: Lovey Newcomer M.D.   On: 07/27/2022 17:49   DG Shoulder Right Port  Result Date: 07/27/2022 CLINICAL DATA:  Trauma EXAM: RIGHT SHOULDER - 1 VIEW COMPARISON:  None Available. FINDINGS: There is no evidence of fracture or dislocation. There is moderate acromioclavicular and glenohumeral joint space narrowing and osteophyte formation compatible with degenerative change. Soft tissues are unremarkable. IMPRESSION: 1. No evidence of fracture or dislocation. 2. Moderate degenerative changes. Electronically Signed   By: Ronney Asters M.D.   On: 07/27/2022 17:03   DG Knee Right Port  Result Date: 07/27/2022 CLINICAL DATA:  Fall with right knee pain. EXAM: PORTABLE RIGHT KNEE - 1-2 VIEW COMPARISON:  None Available. FINDINGS: Mild tricompartmental osteoarthritic change. Chondrocalcinosis over the lateral compartment. Suggestion of a small joint effusion. No evidence of acute fracture or dislocation. Remainder of the exam is unremarkable. IMPRESSION: 1. No acute fracture. 2. Mild osteoarthritic change. Small joint effusion. Electronically Signed   By: Marin Olp M.D.   On: 07/27/2022 16:25   CT HEAD WO CONTRAST  Result Date: 07/27/2022 CLINICAL DATA:  Trauma. EXAM: CT HEAD WITHOUT CONTRAST CT CERVICAL SPINE WITHOUT CONTRAST TECHNIQUE: Multidetector CT imaging of the head and cervical spine was performed following the standard protocol without intravenous contrast. Multiplanar CT image reconstructions of the cervical spine were also generated. RADIATION DOSE REDUCTION: This  exam was performed according to the departmental dose-optimization program which includes automated exposure control, adjustment of the mA and/or kV according to patient size and/or use of iterative reconstruction technique. COMPARISON:  Brain CT 05/01/2019 FINDINGS: CT HEAD FINDINGS  Brain: Ventricles and sulci are prominent compatible with atrophy. Periventricular and subcortical white matter hypodensities compatible with chronic microvascular ischemic changes. No evidence for acute cortically based infarct, intracranial hemorrhage, mass lesion or mass-effect. Left basal ganglia chronic lacunar infarcts. Vascular: No hyperdense vessel or unexpected calcification. Skull: Normal. Negative for fracture or focal lesion. Sinuses/Orbits: Paranasal sinuses well aerated. Mastoid air cells are unremarkable. Orbits are unremarkable. Other: Soft tissue swelling overlying the right frontal calvarium. CT CERVICAL SPINE FINDINGS Alignment: Normal. Skull base and vertebrae: No acute fracture. No primary bone lesion or focal pathologic process. Soft tissues and spinal canal: No prevertebral fluid or swelling. No visible canal hematoma. Disc levels: Multilevel degenerative disc and facet disease throughout the visualized cervical spine. No evidence for acute fracture. Upper chest: Negative. Other: Can not exclude possible distal left clavicle fracture on scout image. IMPRESSION: 1. No acute intracranial abnormality. Atrophy and chronic microvascular ischemic changes. 2. Soft tissue injury overlying the right frontal calvarium. 3. No acute cervical spine fracture. Multilevel degenerative disc disease. 4. Can not exclude possible distal left clavicle fracture on scout image. Recommend correlation with either chest radiograph or left clavicle radiograph. Electronically Signed   By: Lovey Newcomer M.D.   On: 07/27/2022 16:13   CT CERVICAL SPINE WO CONTRAST  Result Date: 07/27/2022 CLINICAL DATA:  Trauma. EXAM: CT HEAD WITHOUT CONTRAST CT CERVICAL SPINE WITHOUT CONTRAST TECHNIQUE: Multidetector CT imaging of the head and cervical spine was performed following the standard protocol without intravenous contrast. Multiplanar CT image reconstructions of the cervical spine were also generated. RADIATION DOSE REDUCTION:  This exam was performed according to the departmental dose-optimization program which includes automated exposure control, adjustment of the mA and/or kV according to patient size and/or use of iterative reconstruction technique. COMPARISON:  Brain CT 05/01/2019 FINDINGS: CT HEAD FINDINGS Brain: Ventricles and sulci are prominent compatible with atrophy. Periventricular and subcortical white matter hypodensities compatible with chronic microvascular ischemic changes. No evidence for acute cortically based infarct, intracranial hemorrhage, mass lesion or mass-effect. Left basal ganglia chronic lacunar infarcts. Vascular: No hyperdense vessel or unexpected calcification. Skull: Normal. Negative for fracture or focal lesion. Sinuses/Orbits: Paranasal sinuses well aerated. Mastoid air cells are unremarkable. Orbits are unremarkable. Other: Soft tissue swelling overlying the right frontal calvarium. CT CERVICAL SPINE FINDINGS Alignment: Normal. Skull base and vertebrae: No acute fracture. No primary bone lesion or focal pathologic process. Soft tissues and spinal canal: No prevertebral fluid or swelling. No visible canal hematoma. Disc levels: Multilevel degenerative disc and facet disease throughout the visualized cervical spine. No evidence for acute fracture. Upper chest: Negative. Other: Can not exclude possible distal left clavicle fracture on scout image. IMPRESSION: 1. No acute intracranial abnormality. Atrophy and chronic microvascular ischemic changes. 2. Soft tissue injury overlying the right frontal calvarium. 3. No acute cervical spine fracture. Multilevel degenerative disc disease. 4. Can not exclude possible distal left clavicle fracture on scout image. Recommend correlation with either chest radiograph or left clavicle radiograph. Electronically Signed   By: Lovey Newcomer M.D.   On: 07/27/2022 16:13   DG Pelvis Portable  Result Date: 07/27/2022 CLINICAL DATA:  Trauma EXAM: PORTABLE PELVIS 1-2 VIEWS  COMPARISON:  None Available. FINDINGS: There is no evidence of displaced pelvic fracture or diastasis. No pelvic bone  lesions are seen. Nonobstructive pattern of overlying bowel gas. IMPRESSION: No displaced pelvic fracture or diastasis. Please note that plain radiographs are significantly insensitive for hip and pelvic fracture. Consider CT to more sensitively evaluate if there is high clinical suspicion for fracture. Electronically Signed   By: Delanna Ahmadi M.D.   On: 07/27/2022 15:31   DG Chest Port 1 View  Result Date: 07/27/2022 CLINICAL DATA:  Trauma, fall EXAM: PORTABLE CHEST 1 VIEW COMPARISON:  12/12/2021 FINDINGS: Cardiomegaly. Both lungs are clear. The visualized skeletal structures are unremarkable. IMPRESSION: Cardiomegaly without acute abnormality of the lungs in AP portable projection. Electronically Signed   By: Delanna Ahmadi M.D.   On: 07/27/2022 15:26    Assessment/Plan 1. Hypoxia - O2 sats 99% on RA, breathing unlabored, lung sounds clear - ? OSA> family does not want aggressive treatment per goals of care - suspect due to inaccurate pulse ox reading - education given to nursing staff - advised to keep sats > 90%  2. Chronic diastolic heart failure (HCC) - weight fluctuations x 1 week> 02/16 4.2 lbs weight gain - 2+ pitting ankle edema - CXR    Family/ staff Communication: plan discussed with patient and nurse  Labs/tests ordered:   CXR

## 2022-08-27 DIAGNOSIS — G468 Other vascular syndromes of brain in cerebrovascular diseases: Secondary | ICD-10-CM | POA: Diagnosis not present

## 2022-08-27 DIAGNOSIS — F01A0 Vascular dementia, mild, without behavioral disturbance, psychotic disturbance, mood disturbance, and anxiety: Secondary | ICD-10-CM | POA: Diagnosis not present

## 2022-08-27 DIAGNOSIS — R41841 Cognitive communication deficit: Secondary | ICD-10-CM | POA: Diagnosis not present

## 2022-08-27 DIAGNOSIS — M6281 Muscle weakness (generalized): Secondary | ICD-10-CM | POA: Diagnosis not present

## 2022-08-27 DIAGNOSIS — S066X0S Traumatic subarachnoid hemorrhage without loss of consciousness, sequela: Secondary | ICD-10-CM | POA: Diagnosis not present

## 2022-08-27 DIAGNOSIS — M6389 Disorders of muscle in diseases classified elsewhere, multiple sites: Secondary | ICD-10-CM | POA: Diagnosis not present

## 2022-08-28 ENCOUNTER — Telehealth: Payer: Self-pay | Admitting: Adult Health

## 2022-08-28 DIAGNOSIS — R41841 Cognitive communication deficit: Secondary | ICD-10-CM | POA: Diagnosis not present

## 2022-08-28 DIAGNOSIS — J181 Lobar pneumonia, unspecified organism: Secondary | ICD-10-CM

## 2022-08-28 DIAGNOSIS — M6389 Disorders of muscle in diseases classified elsewhere, multiple sites: Secondary | ICD-10-CM | POA: Diagnosis not present

## 2022-08-28 DIAGNOSIS — M6281 Muscle weakness (generalized): Secondary | ICD-10-CM | POA: Diagnosis not present

## 2022-08-28 DIAGNOSIS — S066X0S Traumatic subarachnoid hemorrhage without loss of consciousness, sequela: Secondary | ICD-10-CM | POA: Diagnosis not present

## 2022-08-28 DIAGNOSIS — F01A0 Vascular dementia, mild, without behavioral disturbance, psychotic disturbance, mood disturbance, and anxiety: Secondary | ICD-10-CM | POA: Diagnosis not present

## 2022-08-28 DIAGNOSIS — G468 Other vascular syndromes of brain in cerebrovascular diseases: Secondary | ICD-10-CM | POA: Diagnosis not present

## 2022-08-28 MED ORDER — DOXYCYCLINE HYCLATE 100 MG PO TABS: 100.0000 mg | ORAL_TABLET | Freq: Two times a day (BID) | ORAL | 0 refills | Status: DC

## 2022-08-28 NOTE — Telephone Encounter (Signed)
Xray returned showing right lobar pneumonia. Has PCN allergy. Will prescribed doxycycline.  Also will move forward with hospice consult.

## 2022-08-29 DIAGNOSIS — S066X0S Traumatic subarachnoid hemorrhage without loss of consciousness, sequela: Secondary | ICD-10-CM | POA: Diagnosis not present

## 2022-08-29 DIAGNOSIS — R0902 Hypoxemia: Secondary | ICD-10-CM | POA: Diagnosis not present

## 2022-08-29 DIAGNOSIS — L219 Seborrheic dermatitis, unspecified: Secondary | ICD-10-CM | POA: Diagnosis not present

## 2022-08-29 DIAGNOSIS — I252 Old myocardial infarction: Secondary | ICD-10-CM | POA: Diagnosis not present

## 2022-08-29 DIAGNOSIS — J690 Pneumonitis due to inhalation of food and vomit: Secondary | ICD-10-CM | POA: Diagnosis not present

## 2022-08-29 DIAGNOSIS — F01A0 Vascular dementia, mild, without behavioral disturbance, psychotic disturbance, mood disturbance, and anxiety: Secondary | ICD-10-CM | POA: Diagnosis not present

## 2022-08-29 DIAGNOSIS — I110 Hypertensive heart disease with heart failure: Secondary | ICD-10-CM | POA: Diagnosis not present

## 2022-08-29 DIAGNOSIS — M6389 Disorders of muscle in diseases classified elsewhere, multiple sites: Secondary | ICD-10-CM | POA: Diagnosis not present

## 2022-08-29 DIAGNOSIS — I739 Peripheral vascular disease, unspecified: Secondary | ICD-10-CM | POA: Diagnosis not present

## 2022-08-29 DIAGNOSIS — I69391 Dysphagia following cerebral infarction: Secondary | ICD-10-CM | POA: Diagnosis not present

## 2022-08-29 DIAGNOSIS — G468 Other vascular syndromes of brain in cerebrovascular diseases: Secondary | ICD-10-CM | POA: Diagnosis not present

## 2022-08-29 DIAGNOSIS — F0390 Unspecified dementia without behavioral disturbance: Secondary | ICD-10-CM | POA: Diagnosis not present

## 2022-08-29 DIAGNOSIS — N400 Enlarged prostate without lower urinary tract symptoms: Secondary | ICD-10-CM | POA: Diagnosis not present

## 2022-08-29 DIAGNOSIS — I509 Heart failure, unspecified: Secondary | ICD-10-CM | POA: Diagnosis not present

## 2022-08-29 DIAGNOSIS — R41841 Cognitive communication deficit: Secondary | ICD-10-CM | POA: Diagnosis not present

## 2022-08-29 DIAGNOSIS — M4854XD Collapsed vertebra, not elsewhere classified, thoracic region, subsequent encounter for fracture with routine healing: Secondary | ICD-10-CM | POA: Diagnosis not present

## 2022-08-29 DIAGNOSIS — I4891 Unspecified atrial fibrillation: Secondary | ICD-10-CM | POA: Diagnosis not present

## 2022-08-29 DIAGNOSIS — M6281 Muscle weakness (generalized): Secondary | ICD-10-CM | POA: Diagnosis not present

## 2022-08-30 DIAGNOSIS — I4891 Unspecified atrial fibrillation: Secondary | ICD-10-CM | POA: Diagnosis not present

## 2022-08-30 DIAGNOSIS — R0902 Hypoxemia: Secondary | ICD-10-CM | POA: Diagnosis not present

## 2022-08-30 DIAGNOSIS — I509 Heart failure, unspecified: Secondary | ICD-10-CM | POA: Diagnosis not present

## 2022-08-30 DIAGNOSIS — I110 Hypertensive heart disease with heart failure: Secondary | ICD-10-CM | POA: Diagnosis not present

## 2022-08-30 DIAGNOSIS — I69391 Dysphagia following cerebral infarction: Secondary | ICD-10-CM | POA: Diagnosis not present

## 2022-08-30 DIAGNOSIS — M4854XD Collapsed vertebra, not elsewhere classified, thoracic region, subsequent encounter for fracture with routine healing: Secondary | ICD-10-CM | POA: Diagnosis not present

## 2022-09-02 DIAGNOSIS — I509 Heart failure, unspecified: Secondary | ICD-10-CM | POA: Diagnosis not present

## 2022-09-02 DIAGNOSIS — R0902 Hypoxemia: Secondary | ICD-10-CM | POA: Diagnosis not present

## 2022-09-02 DIAGNOSIS — I110 Hypertensive heart disease with heart failure: Secondary | ICD-10-CM | POA: Diagnosis not present

## 2022-09-02 DIAGNOSIS — M4854XD Collapsed vertebra, not elsewhere classified, thoracic region, subsequent encounter for fracture with routine healing: Secondary | ICD-10-CM | POA: Diagnosis not present

## 2022-09-02 DIAGNOSIS — I69391 Dysphagia following cerebral infarction: Secondary | ICD-10-CM | POA: Diagnosis not present

## 2022-09-02 DIAGNOSIS — I4891 Unspecified atrial fibrillation: Secondary | ICD-10-CM | POA: Diagnosis not present

## 2022-09-05 DIAGNOSIS — L219 Seborrheic dermatitis, unspecified: Secondary | ICD-10-CM | POA: Diagnosis not present

## 2022-09-05 DIAGNOSIS — F0390 Unspecified dementia without behavioral disturbance: Secondary | ICD-10-CM | POA: Diagnosis not present

## 2022-09-05 DIAGNOSIS — M4854XD Collapsed vertebra, not elsewhere classified, thoracic region, subsequent encounter for fracture with routine healing: Secondary | ICD-10-CM | POA: Diagnosis not present

## 2022-09-05 DIAGNOSIS — N400 Enlarged prostate without lower urinary tract symptoms: Secondary | ICD-10-CM | POA: Diagnosis not present

## 2022-09-05 DIAGNOSIS — J690 Pneumonitis due to inhalation of food and vomit: Secondary | ICD-10-CM | POA: Diagnosis not present

## 2022-09-05 DIAGNOSIS — I110 Hypertensive heart disease with heart failure: Secondary | ICD-10-CM | POA: Diagnosis not present

## 2022-09-05 DIAGNOSIS — I4891 Unspecified atrial fibrillation: Secondary | ICD-10-CM | POA: Diagnosis not present

## 2022-09-05 DIAGNOSIS — I509 Heart failure, unspecified: Secondary | ICD-10-CM | POA: Diagnosis not present

## 2022-09-05 DIAGNOSIS — I252 Old myocardial infarction: Secondary | ICD-10-CM | POA: Diagnosis not present

## 2022-09-05 DIAGNOSIS — I69391 Dysphagia following cerebral infarction: Secondary | ICD-10-CM | POA: Diagnosis not present

## 2022-09-05 DIAGNOSIS — R0902 Hypoxemia: Secondary | ICD-10-CM | POA: Diagnosis not present

## 2022-09-05 DIAGNOSIS — I739 Peripheral vascular disease, unspecified: Secondary | ICD-10-CM | POA: Diagnosis not present

## 2022-09-07 DIAGNOSIS — I4891 Unspecified atrial fibrillation: Secondary | ICD-10-CM | POA: Diagnosis not present

## 2022-09-07 DIAGNOSIS — M4854XD Collapsed vertebra, not elsewhere classified, thoracic region, subsequent encounter for fracture with routine healing: Secondary | ICD-10-CM | POA: Diagnosis not present

## 2022-09-07 DIAGNOSIS — I69391 Dysphagia following cerebral infarction: Secondary | ICD-10-CM | POA: Diagnosis not present

## 2022-09-07 DIAGNOSIS — I110 Hypertensive heart disease with heart failure: Secondary | ICD-10-CM | POA: Diagnosis not present

## 2022-09-07 DIAGNOSIS — I509 Heart failure, unspecified: Secondary | ICD-10-CM | POA: Diagnosis not present

## 2022-09-07 DIAGNOSIS — R0902 Hypoxemia: Secondary | ICD-10-CM | POA: Diagnosis not present

## 2022-09-08 ENCOUNTER — Non-Acute Institutional Stay (SKILLED_NURSING_FACILITY): Payer: Medicare Other | Admitting: Adult Health

## 2022-09-08 ENCOUNTER — Encounter: Payer: Self-pay | Admitting: Adult Health

## 2022-09-08 DIAGNOSIS — I4891 Unspecified atrial fibrillation: Secondary | ICD-10-CM | POA: Diagnosis not present

## 2022-09-08 DIAGNOSIS — R0902 Hypoxemia: Secondary | ICD-10-CM | POA: Diagnosis not present

## 2022-09-08 DIAGNOSIS — I5032 Chronic diastolic (congestive) heart failure: Secondary | ICD-10-CM

## 2022-09-08 DIAGNOSIS — F0150 Vascular dementia without behavioral disturbance: Secondary | ICD-10-CM | POA: Diagnosis not present

## 2022-09-08 DIAGNOSIS — J9601 Acute respiratory failure with hypoxia: Secondary | ICD-10-CM

## 2022-09-08 DIAGNOSIS — G609 Hereditary and idiopathic neuropathy, unspecified: Secondary | ICD-10-CM

## 2022-09-08 DIAGNOSIS — N401 Enlarged prostate with lower urinary tract symptoms: Secondary | ICD-10-CM | POA: Diagnosis not present

## 2022-09-08 DIAGNOSIS — R338 Other retention of urine: Secondary | ICD-10-CM

## 2022-09-08 DIAGNOSIS — I4811 Longstanding persistent atrial fibrillation: Secondary | ICD-10-CM | POA: Diagnosis not present

## 2022-09-08 DIAGNOSIS — I69391 Dysphagia following cerebral infarction: Secondary | ICD-10-CM | POA: Diagnosis not present

## 2022-09-08 DIAGNOSIS — M25561 Pain in right knee: Secondary | ICD-10-CM | POA: Diagnosis not present

## 2022-09-08 DIAGNOSIS — I509 Heart failure, unspecified: Secondary | ICD-10-CM | POA: Diagnosis not present

## 2022-09-08 DIAGNOSIS — J181 Lobar pneumonia, unspecified organism: Secondary | ICD-10-CM

## 2022-09-08 DIAGNOSIS — I110 Hypertensive heart disease with heart failure: Secondary | ICD-10-CM | POA: Diagnosis not present

## 2022-09-08 DIAGNOSIS — M4854XD Collapsed vertebra, not elsewhere classified, thoracic region, subsequent encounter for fracture with routine healing: Secondary | ICD-10-CM | POA: Diagnosis not present

## 2022-09-08 MED ORDER — OXYCODONE HCL 5 MG PO TABS: 5.0000 mg | ORAL_TABLET | Freq: Every morning | ORAL | 0 refills | Status: DC

## 2022-09-08 NOTE — Progress Notes (Signed)
Location:  Occupational psychologist of Service:  SNF (31) Provider:  [name], NP   [name], [name]  Extended Emergency Contact Information Primary Emergency Contact: Brents,[name] Address: [address]          [place], [place] [address] [name] of [place] Phone: [phone] Mobile Phone: [phone] Relation: Spouse Secondary Emergency Contact: [name],[name] Address: [address]          [place], [place] [address] [place] Mobile Phone: [phone] Relation: Daughter  Code Status:  DNR Goals of care: Advanced Directive information    08/26/2022    9:45 AM  Advanced Directives  Does Patient Have a Medical Advance Directive? Yes  Type of Advance Directive Living will;Out of facility DNR (pink MOST or yellow form)  Does patient want to make changes to medical advance directive? No - Patient declined     Chief Complaint  Patient presents with   Medical Management of Chronic Issues    HPI:  Pt is a 89 y.o. male seen today for management of chronic diseases.   Treated for right lobar pna with doxycycline 08/08/22.  He is on 2 liters of oxygen. Coughing at times. Declining over time with more confusion and agitation. Under hospice care due to progressive dementia, falls. He is using scheduled and prn oxycodone for pain. Also using prn ativan for agitation. He is still eating and drinking per staff records. Sleeping more, talking less.   In the ED 1/25 diagnosed with small SDH and coumadin placed on hold for two weeks. Neurosurgery was consulted and due to the small size coumadin was not reversed. Also sacral fx noted at S4 and TP fracture at L4.  He was sent home with oxycodone and therapy orders. He also was given a dose of cephalosporin for possible right knee infection. Right knee xray 1/25 showed no acute fx but some soft tissue swelling and small joint  effusion   Also he fell on 1/21 and was sent to the ED where he was treated for a laceration to his right forehead with sutures. CT of the head was done then as well and showed no acute bleed. He had right shoulder pain after this fall. Xray on the right shoulder was negative. The right knee also was painful and had swelling and bruising fairly significant with a skin tear. On 1/24 he was given prednisone for the right knee pain.   CHF diastolic: gaining weight, used prn lasix. Requiring oxygen 2 liters. No short of breath. Having more edema.  Echo 12/09/21 EF 55-60% with severely dilated left atrium and grade III DD Had NSTEMI treated medically in 2023  Afib: chronic, rate controlled without meds. Coumadin discontinued due to falls.   Urinary retention: due to BPH has foley due to chronic retention. Hx of recurrent UTI and renal mass no longer followed due to age, dementia, goals of care  Dementia: progressing over the past year. Now not ambulatory and dependent on staff for all needs. Using prn ativan for agitation, picking in the air.   Chronic pancytopenia: has seen Dr Alen Blew in the past. Possibly due to etoh vs myelodysplasia. Due to goals of care no longer going out for apts.     Past Medical History:  Diagnosis Date   Adult failure to thrive    Per incoming records from [facility]   Allergic rhinitis    Per incoming records  from [facility]   Atrial fibrillation [facility])    BPH (benign prostatic hyperplasia)    Per incoming records from [place] of kidney [facility])    Right, Per incoming records from [place]    Per incoming records from [facility]   Cerebrovascular accident, late effects    Per incoming records from [facility]   Chronic kidney disease, stage III (moderate) ([place])    Per incoming records from [place] [facility])     Per incoming records from [facility]   Cognitive impairment    Mild, Per incoming records from [facility]   Constipation    Per incoming records from [facility]   Dementia [facility])    Diarrhea    Better off Aricept, Per incoming records from [facility]   Diastolic dysfunction    on 2D Echo 07/2009 and 2016, Per incoming records from [facility]   Diverticulosis    Mild, Per incoming records from [facility]   Diverticulosis of colon (without mention of hemorrhage)    ED (erectile dysfunction)    Per incoming records from [facility]   History of Coumadin therapy    Per incoming records from [facility]   History of echocardiogram 09/2014   Per incoming records from [facility]   Hyperlipemia    Hypertension    Kidney mass    Per incoming records from [place] leg edema    Per incoming records from [facility])    Neuropathy    Per incoming records from [place] (osteoarthritis)    Per incoming records from [facility]   Peripheral neuropathy    Per incoming records from [place] history of colonic [place] & 2004   adenomatous polyps   Pulmonary arterial hypertension ([place])    Per incoming records from [place]    Per incoming records from [facility]   Thrombocytopenia [facility]) 2017   Per incoming records from [facility]   UTI (urinary tract infection)    Sepsis, Per incoming records from [place] hypertrophy    Gilford Rile as ambulation aid    Per incoming records from [facility]   Past Surgical History:  Procedure Laterality Date   APPENDECTOMY     Per incoming records from [place]     Per incoming records from [place]  11/21/2019   KNEE SURGERY     right   PILONIDAL CYST DRAINAGE     POLYPECTOMY     Per incoming records from [place] tumor lumbar spine     SPINE SURGERY     tumor removed   THORACIC 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, Per incoming records from [place]      Allergies  Allergen Reactions   Penicillins Rash    Has patient had a PCN reaction causing immediate rash, facial/tongue/throat swelling, SOB or lightheadedness with hypotension: unknown Has patient had a PCN reaction causing severe rash involving mucus membranes or skin necrosis: unknown Has patient had a PCN reaction that required  hospitalization : unknown Has patient had a PCN reaction occurring within the last 10 years: no If all of the above answers are "NO", then may proceed with Cephalosporin use.     Outpatient Encounter Medications as of 09/08/2022  Medication Sig   acetaminophen (TYLENOL) 500 MG tablet Take 1,000 mg by mouth 2 (two) times daily.   cholecalciferol (VITAMIN D3) 25 MCG (1000 UT) tablet Take 2,000 Units by mouth daily.    Cranberry 400 MG CAPS Take 400 mg by mouth every morning. 400 mg, oral, Once a morning   DULoxetine (CYMBALTA) 20 MG capsule Take 20 mg by mouth daily.   furosemide (LASIX) 20 MG tablet Take 2 tablets (40 mg total) by mouth daily. Take extra '20mg'$  for weight gain of 2-3lbs in 1 day or 5 lbs in 1 week   furosemide (LASIX) 20 MG tablet Take 20 mg by mouth as needed.   gabapentin (NEURONTIN) 100 MG capsule Take 200 mg by mouth at bedtime.   gabapentin (NEURONTIN) 100 MG capsule Take 200 mg by mouth daily. In the morning 8 am-11 am, see other listing for bedtime dosing   ketoconazole (NIZORAL) 2 % shampoo Apply 1 Application topically 2 (two) times a week.   oxyCODONE  (OXY IR/ROXICODONE) 5 MG immediate release tablet Take 1 tablet (5 mg total) by mouth in the morning. For pain before transfer   polyethylene glycol (MIRALAX / GLYCOLAX) 17 g packet Take 17 g by mouth daily.   potassium chloride (MICRO-K) 10 MEQ CR capsule Take 10 mEq by mouth every Tuesday, Thursday, and Saturday at 6 PM. Every Tues., Thurs., Sat. 7:00 am - 3:00 pm, per [facility].   triamcinolone lotion (KENALOG) 0.1 % Apply 1 application. topically 2 (two) times daily as needed (rash on back).   [DISCONTINUED] doxycycline (VIBRA-TABS) 100 MG tablet Take 1 tablet (100 mg total) by mouth 2 (two) times daily.   [DISCONTINUED] oxyCODONE (OXY IR/ROXICODONE) 5 MG immediate release tablet Take 5 mg by mouth in the morning. For pain before transfer   No facility-administered encounter medications on file as of 09/08/2022.    Review of Systems  Constitutional:  Positive for activity change and unexpected weight change. Negative for appetite change, chills, diaphoresis, fatigue and fever.  Respiratory:  Positive for cough. Negative for shortness of breath, wheezing and stridor.   Cardiovascular:  Positive for leg swelling. Negative for chest pain and palpitations.  Gastrointestinal:  Negative for abdominal distention, abdominal pain, constipation and diarrhea.  Genitourinary:  Negative for difficulty urinating and dysuria.  Musculoskeletal:  Positive for arthralgias, back pain, gait problem and joint swelling. Negative for myalgias.  Skin:  Positive for color change and wound.  Neurological:  Negative for dizziness, seizures, syncope, facial asymmetry, speech difficulty, weakness and headaches.  Hematological:  Negative for adenopathy. Does not bruise/bleed easily.  Psychiatric/Behavioral:  Positive for agitation, behavioral problems and confusion.     Immunization History  Administered Date(s) Administered   DTaP 01/20/2022   Influenza, High Dose Seasonal PF 05/04/2020, 04/10/2021    Influenza-Unspecified 04/17/2014, 05/01/2016, 04/15/2019, 04/11/2022   Moderna Covid-19 Vaccine Bivalent Booster 65yr & up 04/17/2021   Moderna SARS-COV2 Booster Vaccination 05/17/2020, 05/12/2022   Moderna Sars-Covid-2 Vaccination 07/12/2019, 08/09/2019, 11/03/2021   Pneumococcal Conjugate-13 04/20/2014   Pneumococcal Polysaccharide-23 07/08/2003, 03/31/2011   Pneumococcal-Unspecified 01/26/2004   Td 08/23/2007   Tdap 01/07/2012, 01/20/2022   Zoster Recombinat (Shingrix) 10/05/2017, 12/09/2017   Zoster, Live 07/17/2005   Pertinent  Health Maintenance Due  Topic Date  Due   INFLUENZA VACCINE  Completed      12/15/2021    9:00 AM 12/16/2021   10:00 AM 05/27/2022    1:32 PM 07/30/2022    2:31 PM 09/08/2022   11:31 AM  Fall Risk  Falls in the past year?   0 0 1  Was there an injury with Fall?   0 0 1  Fall Risk Category Calculator   0 0 3  Fall Risk Category (Retired)   Low    (RETIRED) Patient Fall Risk Level High fall risk High fall risk Low fall risk    Patient at Risk for Falls Due to    History of fall(s) Impaired balance/gait;History of fall(s)  Fall risk Follow up    Falls evaluation completed Falls evaluation completed   Functional Status Survey:    Vitals:   09/08/22 1128  BP: 132/78  Pulse: 83  Resp: 20  Temp: 97.7 F (36.5 C)  SpO2: 96%  Weight: 266 lb 12.8 oz (121 kg)   Body mass index is 30.06 kg/m.   Physical Exam Vitals and nursing note reviewed.  Constitutional:      General: He is not in acute distress.    Appearance: He is not diaphoretic.     Comments: Asleep, arouses  HENT:     Head:     Comments: Bruising to right forehead.     Mouth/Throat:     Mouth: Mucous membranes are moist.     Pharynx: Oropharynx is clear.  Eyes:     Conjunctiva/sclera: Conjunctivae normal.     Pupils: Pupils are equal, round, and reactive to light.  Neck:     Thyroid: No thyromegaly.     Vascular: No JVD.     Trachea: No tracheal deviation.  Cardiovascular:      Rate and Rhythm: Normal rate. Rhythm irregular.     Heart sounds: No murmur heard. Pulmonary:     Effort: Pulmonary effort is normal. No respiratory distress.     Breath sounds: No wheezing.     Comments: Chronic cyanosis. Decreased bases Abdominal:     General: Bowel sounds are normal. There is no distension.     Palpations: Abdomen is soft.     Tenderness: There is no abdominal tenderness.  Genitourinary:    Comments: Dark urine in s/p bag S/p site CDI Musculoskeletal:     Right lower leg: Edema (+3) present.     Left lower leg: Edema (+2) present.  Lymphadenopathy:     Cervical: No cervical adenopathy.  Skin:    General: Skin is warm and dry.  Neurological:     General: No focal deficit present.     Mental Status: Mental status is at baseline.     Cranial Nerves: No cranial nerve deficit.     Labs reviewed: Recent Labs    07/27/22 1503 07/31/22 1655 08/01/22 0300  NA 140 137  140 138  K 4.5 4.9  5.0 4.2  CL 107 104  106 106  CO2 '26 24 22  '$ GLUCOSE 107* 172*  165* 143*  BUN 26* 30*  33* 27*  CREATININE 1.54* 1.57*  1.60* 1.33*  CALCIUM 10.4* 10.2 9.8    Recent Labs    07/27/22 1503 07/31/22 1655 08/01/22 0300  AST 42* 25 21  ALT '8 11 11  '$ ALKPHOS 100 96 80  BILITOT 0.9 1.3* 1.1  PROT 6.7 6.7 6.0*  ALBUMIN 3.8 3.9 3.3*    Recent Labs    12/11/21 2010  12/12/21 0650 05/05/22 0000 05/23/22 1657 06/09/22 0815 07/27/22 1503 07/31/22 1655 08/01/22 0300  WBC 5.1   < > 2.1 2.3   < > 2.5* 3.1* 3.9*  NEUTROABS 4.1  --  1.00 1.30  --   --   --   --   HGB 13.2   < > 11.9* 10.5*   < > 11.3* 10.6*  11.2* 9.2*  HCT 43.1   < > 37* 33*   < > 38.9* 34.9*  33.0* 30.0*  MCV 90.5   < >  --   --   --  94.2 91.8 90.4  PLT 88*   < > 77* 18*   < > 74* 73* 67*   < > = values in this interval not displayed.    Lab Results  Component Value Date   TSH 0.84 05/19/2019   Lab Results  Component Value Date   HGBA1C 6 08/22/2019   Lab Results  Component Value  Date   CHOL 129 08/09/2020   HDL 32 (A) 08/09/2020   LDLCALC 80 08/09/2020   TRIG 86 08/09/2020    Significant Diagnostic Results in last 30 days:  No results found.  Assessment/Plan   1. Chronic diastolic heart failure (HCC) Gaining weight Can use prn lasix, would not be aggressive due to goals of care.   2. Longstanding persistent atrial fibrillation ([place]) Rate is controlled Not on coumadin due SDH and falls.   3. Lobar pneumonia ([place]) Continues with cough and is on oxygen Due to his goals of care would not pursue additional treatment Seems to be progressing and nearing end of life.   4. Acute respiratory failure with hypoxia (HCC) Oxygen as needed Ativan prn for agitation Oxycodone for pain   5. Vascular dementia without behavioral disturbance (HCC) Progressive decline in cognition and physical function c/w the disease. Continue supportive care in the skilled environment.  6. Hereditary and idiopathic peripheral neuropathy Can continue neurontin,if not swallowing would d/c and transition to morphine.   7. Urinary retention due to benign prostatic hyperplasia S/P cath change q 6 weeks.   8. Right knee pain Pain controlled with oxy Not going out to ortho

## 2022-09-09 DIAGNOSIS — I4891 Unspecified atrial fibrillation: Secondary | ICD-10-CM | POA: Diagnosis not present

## 2022-09-09 DIAGNOSIS — M4854XD Collapsed vertebra, not elsewhere classified, thoracic region, subsequent encounter for fracture with routine healing: Secondary | ICD-10-CM | POA: Diagnosis not present

## 2022-09-09 DIAGNOSIS — I110 Hypertensive heart disease with heart failure: Secondary | ICD-10-CM | POA: Diagnosis not present

## 2022-09-09 DIAGNOSIS — I509 Heart failure, unspecified: Secondary | ICD-10-CM | POA: Diagnosis not present

## 2022-09-09 DIAGNOSIS — R0902 Hypoxemia: Secondary | ICD-10-CM | POA: Diagnosis not present

## 2022-09-09 DIAGNOSIS — I69391 Dysphagia following cerebral infarction: Secondary | ICD-10-CM | POA: Diagnosis not present

## 2022-09-10 DIAGNOSIS — I69391 Dysphagia following cerebral infarction: Secondary | ICD-10-CM | POA: Diagnosis not present

## 2022-09-10 DIAGNOSIS — R0902 Hypoxemia: Secondary | ICD-10-CM | POA: Diagnosis not present

## 2022-09-10 DIAGNOSIS — I4891 Unspecified atrial fibrillation: Secondary | ICD-10-CM | POA: Diagnosis not present

## 2022-09-10 DIAGNOSIS — M4854XD Collapsed vertebra, not elsewhere classified, thoracic region, subsequent encounter for fracture with routine healing: Secondary | ICD-10-CM | POA: Diagnosis not present

## 2022-09-10 DIAGNOSIS — I110 Hypertensive heart disease with heart failure: Secondary | ICD-10-CM | POA: Diagnosis not present

## 2022-09-10 DIAGNOSIS — I509 Heart failure, unspecified: Secondary | ICD-10-CM | POA: Diagnosis not present

## 2022-09-12 DIAGNOSIS — I69391 Dysphagia following cerebral infarction: Secondary | ICD-10-CM | POA: Diagnosis not present

## 2022-09-12 DIAGNOSIS — R0902 Hypoxemia: Secondary | ICD-10-CM | POA: Diagnosis not present

## 2022-09-12 DIAGNOSIS — M4854XD Collapsed vertebra, not elsewhere classified, thoracic region, subsequent encounter for fracture with routine healing: Secondary | ICD-10-CM | POA: Diagnosis not present

## 2022-09-12 DIAGNOSIS — I110 Hypertensive heart disease with heart failure: Secondary | ICD-10-CM | POA: Diagnosis not present

## 2022-09-12 DIAGNOSIS — I4891 Unspecified atrial fibrillation: Secondary | ICD-10-CM | POA: Diagnosis not present

## 2022-09-12 DIAGNOSIS — I509 Heart failure, unspecified: Secondary | ICD-10-CM | POA: Diagnosis not present

## 2022-09-13 DIAGNOSIS — I4891 Unspecified atrial fibrillation: Secondary | ICD-10-CM | POA: Diagnosis not present

## 2022-09-13 DIAGNOSIS — I69391 Dysphagia following cerebral infarction: Secondary | ICD-10-CM | POA: Diagnosis not present

## 2022-09-13 DIAGNOSIS — R0902 Hypoxemia: Secondary | ICD-10-CM | POA: Diagnosis not present

## 2022-09-13 DIAGNOSIS — I110 Hypertensive heart disease with heart failure: Secondary | ICD-10-CM | POA: Diagnosis not present

## 2022-09-13 DIAGNOSIS — M4854XD Collapsed vertebra, not elsewhere classified, thoracic region, subsequent encounter for fracture with routine healing: Secondary | ICD-10-CM | POA: Diagnosis not present

## 2022-09-13 DIAGNOSIS — I509 Heart failure, unspecified: Secondary | ICD-10-CM | POA: Diagnosis not present

## 2022-09-14 DIAGNOSIS — I509 Heart failure, unspecified: Secondary | ICD-10-CM | POA: Diagnosis not present

## 2022-09-14 DIAGNOSIS — M4854XD Collapsed vertebra, not elsewhere classified, thoracic region, subsequent encounter for fracture with routine healing: Secondary | ICD-10-CM | POA: Diagnosis not present

## 2022-09-14 DIAGNOSIS — I69391 Dysphagia following cerebral infarction: Secondary | ICD-10-CM | POA: Diagnosis not present

## 2022-09-14 DIAGNOSIS — I4891 Unspecified atrial fibrillation: Secondary | ICD-10-CM | POA: Diagnosis not present

## 2022-09-14 DIAGNOSIS — I110 Hypertensive heart disease with heart failure: Secondary | ICD-10-CM | POA: Diagnosis not present

## 2022-09-14 DIAGNOSIS — R0902 Hypoxemia: Secondary | ICD-10-CM | POA: Diagnosis not present

## 2022-09-15 DIAGNOSIS — I110 Hypertensive heart disease with heart failure: Secondary | ICD-10-CM | POA: Diagnosis not present

## 2022-09-15 DIAGNOSIS — I4891 Unspecified atrial fibrillation: Secondary | ICD-10-CM | POA: Diagnosis not present

## 2022-09-15 DIAGNOSIS — M4854XD Collapsed vertebra, not elsewhere classified, thoracic region, subsequent encounter for fracture with routine healing: Secondary | ICD-10-CM | POA: Diagnosis not present

## 2022-09-15 DIAGNOSIS — I69391 Dysphagia following cerebral infarction: Secondary | ICD-10-CM | POA: Diagnosis not present

## 2022-09-15 DIAGNOSIS — R0902 Hypoxemia: Secondary | ICD-10-CM | POA: Diagnosis not present

## 2022-09-15 DIAGNOSIS — I509 Heart failure, unspecified: Secondary | ICD-10-CM | POA: Diagnosis not present

## 2022-09-16 DIAGNOSIS — R0902 Hypoxemia: Secondary | ICD-10-CM | POA: Diagnosis not present

## 2022-09-16 DIAGNOSIS — I110 Hypertensive heart disease with heart failure: Secondary | ICD-10-CM | POA: Diagnosis not present

## 2022-09-16 DIAGNOSIS — I4891 Unspecified atrial fibrillation: Secondary | ICD-10-CM | POA: Diagnosis not present

## 2022-09-16 DIAGNOSIS — I69391 Dysphagia following cerebral infarction: Secondary | ICD-10-CM | POA: Diagnosis not present

## 2022-09-16 DIAGNOSIS — I509 Heart failure, unspecified: Secondary | ICD-10-CM | POA: Diagnosis not present

## 2022-09-16 DIAGNOSIS — M4854XD Collapsed vertebra, not elsewhere classified, thoracic region, subsequent encounter for fracture with routine healing: Secondary | ICD-10-CM | POA: Diagnosis not present

## 2022-09-17 DIAGNOSIS — R0902 Hypoxemia: Secondary | ICD-10-CM | POA: Diagnosis not present

## 2022-09-17 DIAGNOSIS — I69391 Dysphagia following cerebral infarction: Secondary | ICD-10-CM | POA: Diagnosis not present

## 2022-09-17 DIAGNOSIS — I110 Hypertensive heart disease with heart failure: Secondary | ICD-10-CM | POA: Diagnosis not present

## 2022-09-17 DIAGNOSIS — M4854XD Collapsed vertebra, not elsewhere classified, thoracic region, subsequent encounter for fracture with routine healing: Secondary | ICD-10-CM | POA: Diagnosis not present

## 2022-09-17 DIAGNOSIS — I509 Heart failure, unspecified: Secondary | ICD-10-CM | POA: Diagnosis not present

## 2022-09-17 DIAGNOSIS — I4891 Unspecified atrial fibrillation: Secondary | ICD-10-CM | POA: Diagnosis not present

## 2022-09-18 DIAGNOSIS — I69391 Dysphagia following cerebral infarction: Secondary | ICD-10-CM | POA: Diagnosis not present

## 2022-09-18 DIAGNOSIS — I4891 Unspecified atrial fibrillation: Secondary | ICD-10-CM | POA: Diagnosis not present

## 2022-09-18 DIAGNOSIS — M4854XD Collapsed vertebra, not elsewhere classified, thoracic region, subsequent encounter for fracture with routine healing: Secondary | ICD-10-CM | POA: Diagnosis not present

## 2022-09-18 DIAGNOSIS — I509 Heart failure, unspecified: Secondary | ICD-10-CM | POA: Diagnosis not present

## 2022-09-18 DIAGNOSIS — I110 Hypertensive heart disease with heart failure: Secondary | ICD-10-CM | POA: Diagnosis not present

## 2022-09-18 DIAGNOSIS — R0902 Hypoxemia: Secondary | ICD-10-CM | POA: Diagnosis not present

## 2022-09-19 DIAGNOSIS — I110 Hypertensive heart disease with heart failure: Secondary | ICD-10-CM | POA: Diagnosis not present

## 2022-09-19 DIAGNOSIS — I4891 Unspecified atrial fibrillation: Secondary | ICD-10-CM | POA: Diagnosis not present

## 2022-09-19 DIAGNOSIS — I509 Heart failure, unspecified: Secondary | ICD-10-CM | POA: Diagnosis not present

## 2022-09-19 DIAGNOSIS — M4854XD Collapsed vertebra, not elsewhere classified, thoracic region, subsequent encounter for fracture with routine healing: Secondary | ICD-10-CM | POA: Diagnosis not present

## 2022-09-19 DIAGNOSIS — I69391 Dysphagia following cerebral infarction: Secondary | ICD-10-CM | POA: Diagnosis not present

## 2022-09-19 DIAGNOSIS — R0902 Hypoxemia: Secondary | ICD-10-CM | POA: Diagnosis not present

## 2022-09-20 DIAGNOSIS — I509 Heart failure, unspecified: Secondary | ICD-10-CM | POA: Diagnosis not present

## 2022-09-20 DIAGNOSIS — I69391 Dysphagia following cerebral infarction: Secondary | ICD-10-CM | POA: Diagnosis not present

## 2022-09-20 DIAGNOSIS — I4891 Unspecified atrial fibrillation: Secondary | ICD-10-CM | POA: Diagnosis not present

## 2022-09-20 DIAGNOSIS — M4854XD Collapsed vertebra, not elsewhere classified, thoracic region, subsequent encounter for fracture with routine healing: Secondary | ICD-10-CM | POA: Diagnosis not present

## 2022-09-20 DIAGNOSIS — R0902 Hypoxemia: Secondary | ICD-10-CM | POA: Diagnosis not present

## 2022-09-20 DIAGNOSIS — I110 Hypertensive heart disease with heart failure: Secondary | ICD-10-CM | POA: Diagnosis not present

## 2022-09-21 DIAGNOSIS — M4854XD Collapsed vertebra, not elsewhere classified, thoracic region, subsequent encounter for fracture with routine healing: Secondary | ICD-10-CM | POA: Diagnosis not present

## 2022-09-21 DIAGNOSIS — I69391 Dysphagia following cerebral infarction: Secondary | ICD-10-CM | POA: Diagnosis not present

## 2022-09-21 DIAGNOSIS — I110 Hypertensive heart disease with heart failure: Secondary | ICD-10-CM | POA: Diagnosis not present

## 2022-09-21 DIAGNOSIS — R0902 Hypoxemia: Secondary | ICD-10-CM | POA: Diagnosis not present

## 2022-09-21 DIAGNOSIS — I509 Heart failure, unspecified: Secondary | ICD-10-CM | POA: Diagnosis not present

## 2022-09-21 DIAGNOSIS — I4891 Unspecified atrial fibrillation: Secondary | ICD-10-CM | POA: Diagnosis not present

## 2022-09-22 DIAGNOSIS — I69391 Dysphagia following cerebral infarction: Secondary | ICD-10-CM | POA: Diagnosis not present

## 2022-09-22 DIAGNOSIS — I509 Heart failure, unspecified: Secondary | ICD-10-CM | POA: Diagnosis not present

## 2022-09-22 DIAGNOSIS — I4891 Unspecified atrial fibrillation: Secondary | ICD-10-CM | POA: Diagnosis not present

## 2022-09-22 DIAGNOSIS — M4854XD Collapsed vertebra, not elsewhere classified, thoracic region, subsequent encounter for fracture with routine healing: Secondary | ICD-10-CM | POA: Diagnosis not present

## 2022-09-22 DIAGNOSIS — R0902 Hypoxemia: Secondary | ICD-10-CM | POA: Diagnosis not present

## 2022-09-22 DIAGNOSIS — I110 Hypertensive heart disease with heart failure: Secondary | ICD-10-CM | POA: Diagnosis not present

## 2022-10-06 DEATH — deceased

## 2022-11-17 IMAGING — DX DG CHEST 2V
2 series · 2 of 2 positions shown · non-contrast
Comparison: Radiograph December 11, 2021

CLINICAL DATA: Shortness of breath history of hypertension and
AFib.

EXAM:
CHEST - 2 VIEW

[chest lat]
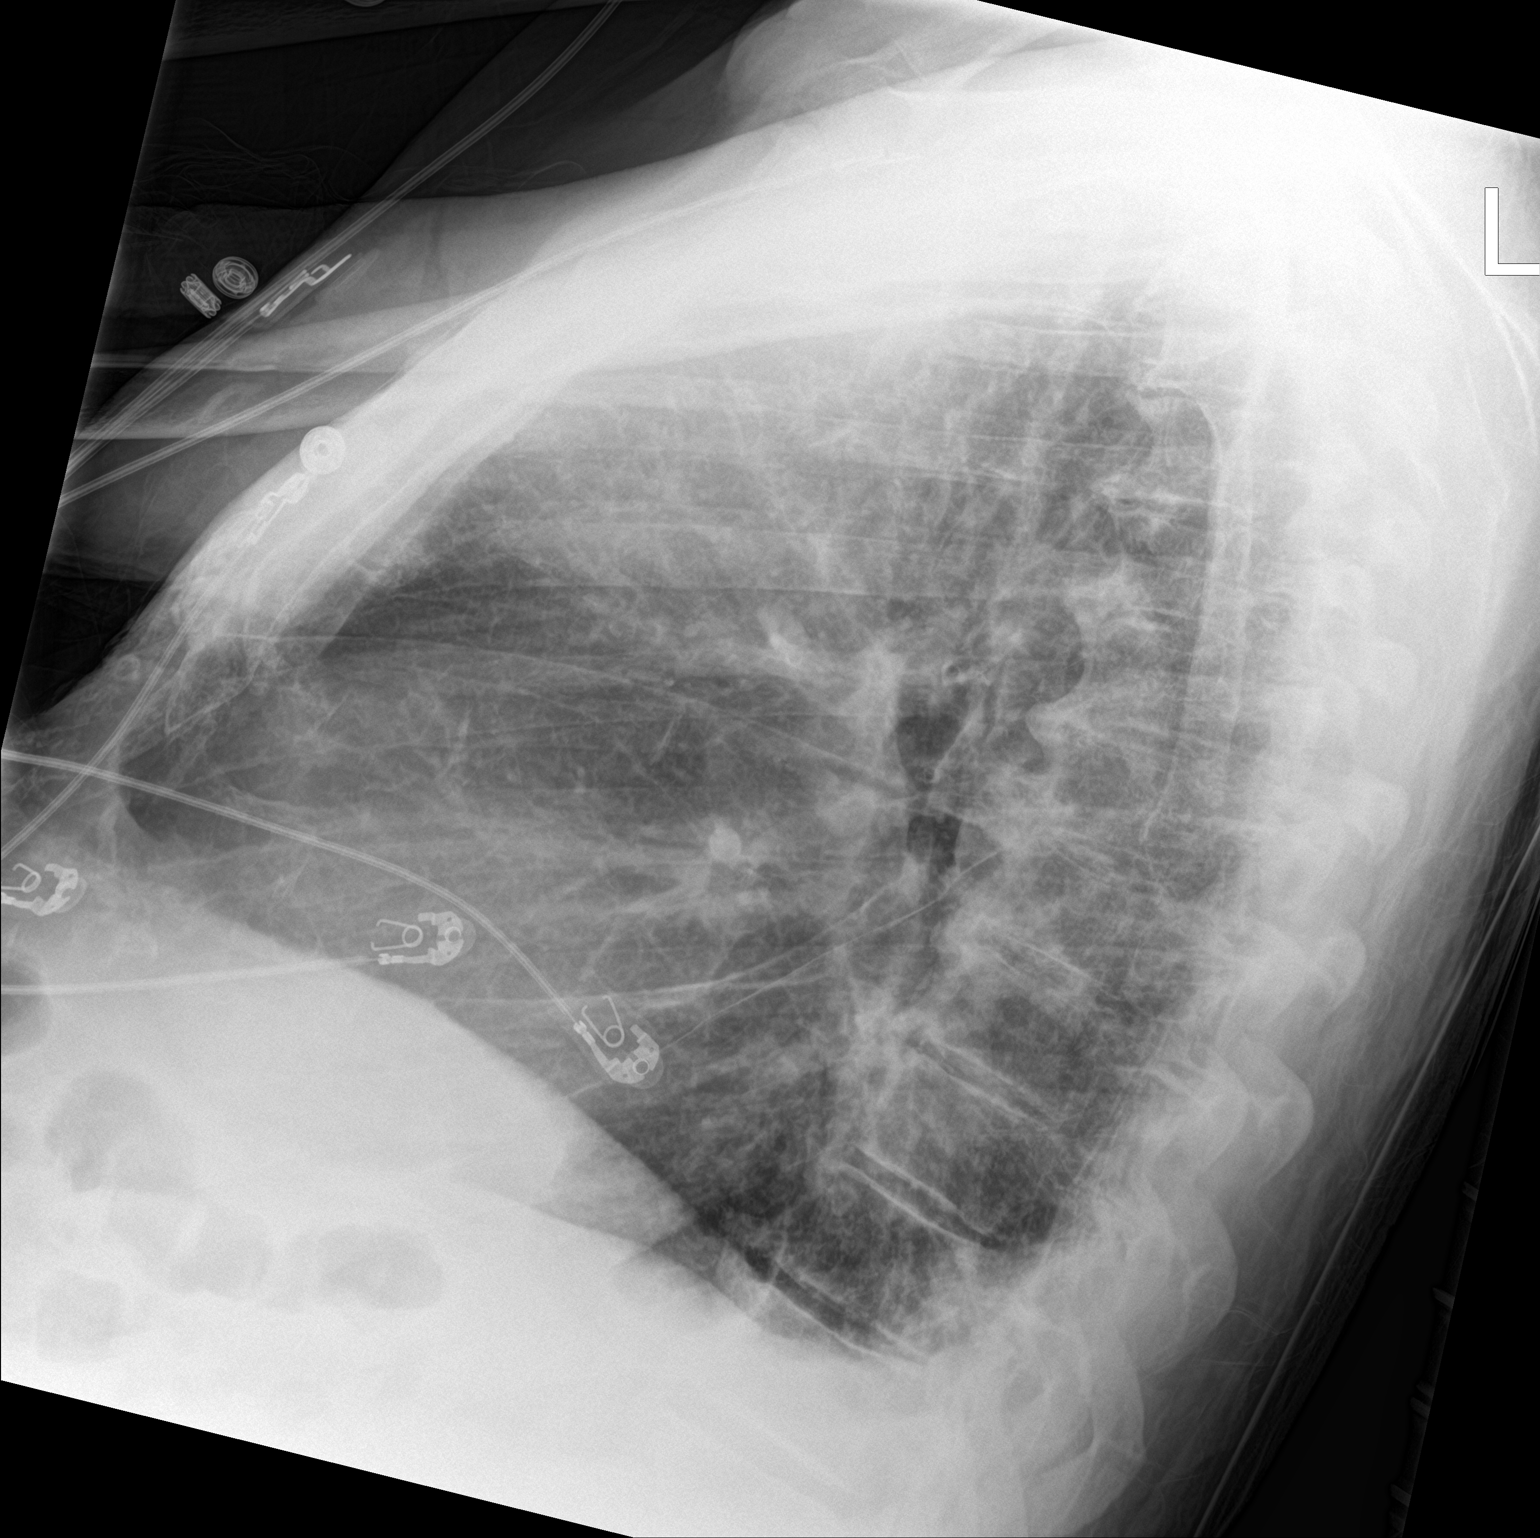

[chest ap]
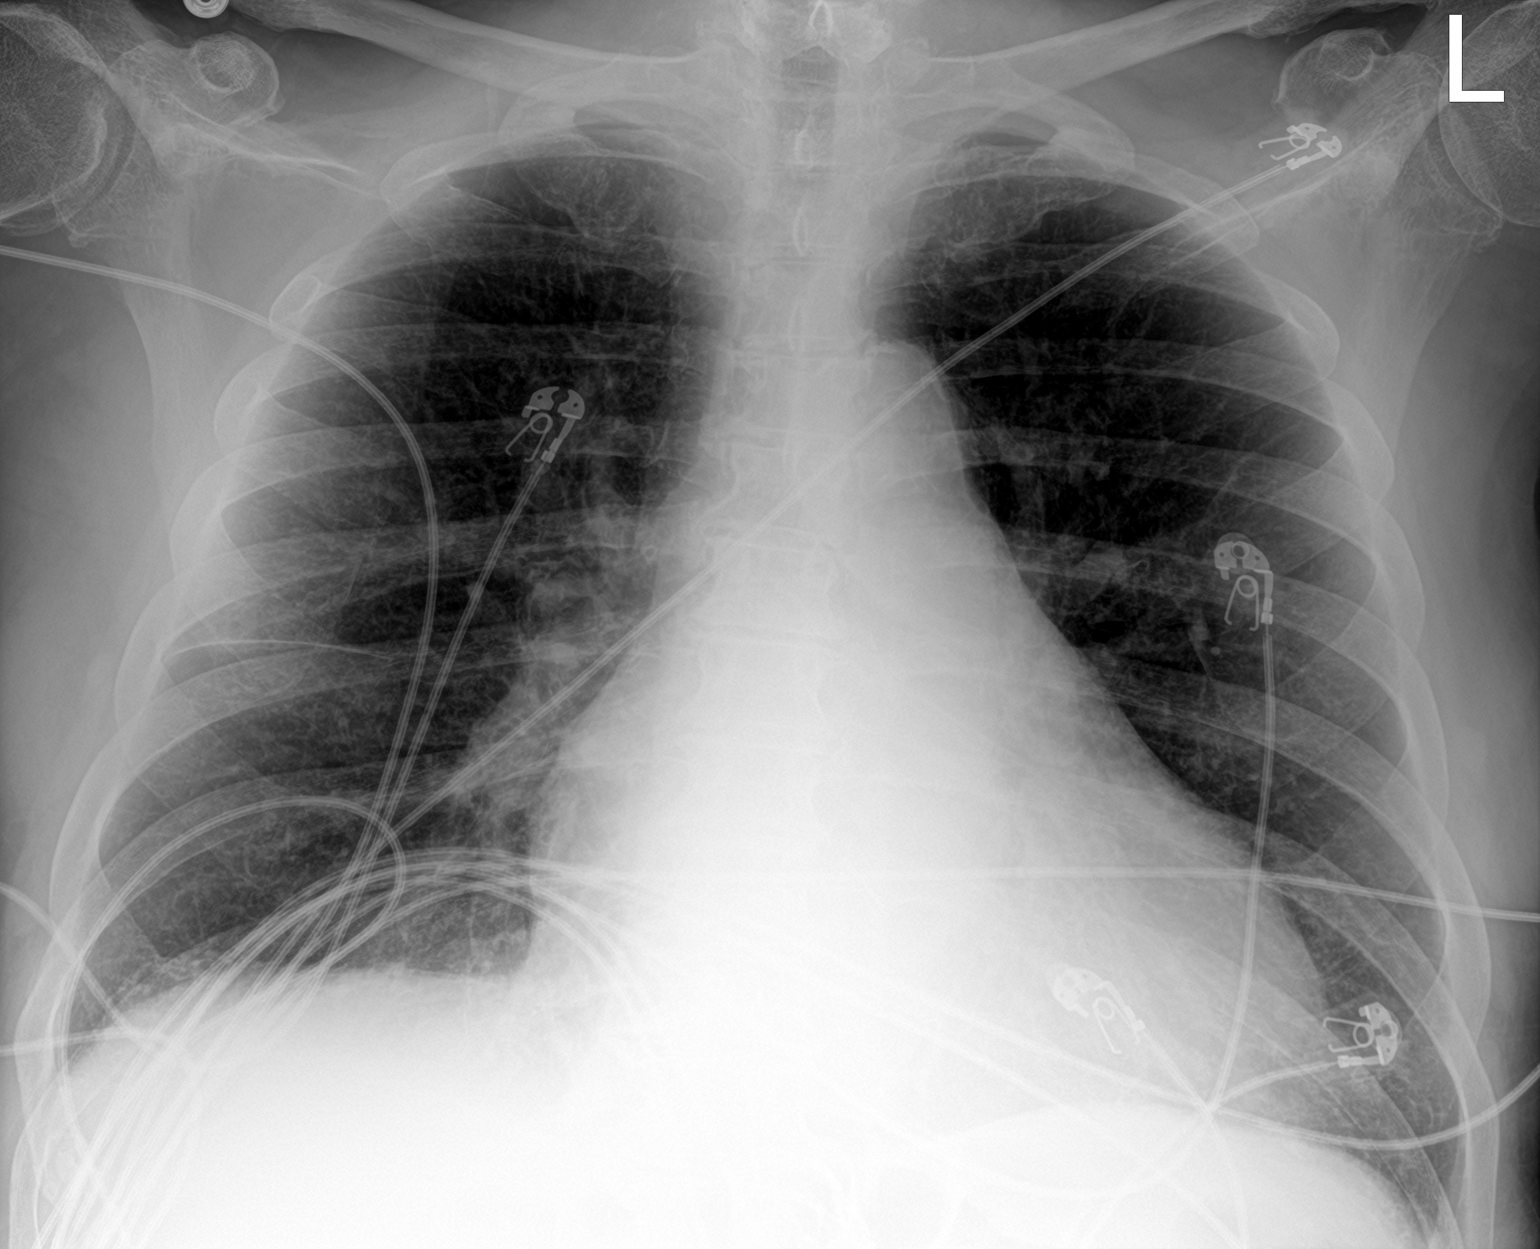

[2 of 2 positions shown; findings below may reference images not displayed]

FINDINGS: Similar enlarged cardiac silhouette. Unchanged cardiomediastinal
contours. Aortic atherosclerosis. No overt pulmonary edema or focal
airspace consolidation. Trace bilateral pleural effusions. Thoracic
spondylosis.
IMPRESSION: Enlarged cardiac silhouette and central vascular prominence without
overt pulmonary edema. Trace bilateral pleural effusions.
